# Patient Record
Sex: Male | Born: 1940 | Race: White | Hispanic: No | Smoking: Former smoker
Health system: Southern US, Community
[De-identification: ages and names within clinical notes are randomized; demographics above are authoritative.]

## PROBLEM LIST (undated history)

## (undated) DIAGNOSIS — K219 Gastro-esophageal reflux disease without esophagitis: Secondary | ICD-10-CM

## (undated) DIAGNOSIS — R42 Dizziness and giddiness: Secondary | ICD-10-CM

## (undated) DIAGNOSIS — F419 Anxiety disorder, unspecified: Secondary | ICD-10-CM

## (undated) DIAGNOSIS — I10 Essential (primary) hypertension: Secondary | ICD-10-CM

## (undated) HISTORY — DX: Anxiety disorder, unspecified: F41.9

## (undated) HISTORY — DX: Gastro-esophageal reflux disease without esophagitis: K21.9

## (undated) HISTORY — PX: SPINE SURGERY: SHX786

## (undated) HISTORY — DX: Essential (primary) hypertension: I10

---

## 1998-12-30 ENCOUNTER — Ambulatory Visit (HOSPITAL_COMMUNITY): Admission: RE | Admit: 1998-12-30 | Discharge: 1998-12-30 | Payer: Self-pay | Admitting: Family Medicine

## 1998-12-30 ENCOUNTER — Encounter: Payer: Self-pay | Admitting: Family Medicine

## 1998-12-31 ENCOUNTER — Ambulatory Visit (HOSPITAL_BASED_OUTPATIENT_CLINIC_OR_DEPARTMENT_OTHER): Admission: RE | Admit: 1998-12-31 | Discharge: 1998-12-31 | Payer: Self-pay | Admitting: Orthopedic Surgery

## 2001-01-23 ENCOUNTER — Encounter: Payer: Self-pay | Admitting: *Deleted

## 2001-01-23 ENCOUNTER — Emergency Department (HOSPITAL_COMMUNITY): Admission: EM | Admit: 2001-01-23 | Discharge: 2001-01-23 | Payer: Self-pay | Admitting: *Deleted

## 2001-07-14 ENCOUNTER — Encounter: Payer: Self-pay | Admitting: Family Medicine

## 2001-07-14 ENCOUNTER — Ambulatory Visit (HOSPITAL_COMMUNITY): Admission: RE | Admit: 2001-07-14 | Discharge: 2001-07-14 | Payer: Self-pay | Admitting: Family Medicine

## 2001-11-27 ENCOUNTER — Emergency Department (HOSPITAL_COMMUNITY): Admission: EM | Admit: 2001-11-27 | Discharge: 2001-11-27 | Payer: Self-pay

## 2002-02-12 ENCOUNTER — Emergency Department (HOSPITAL_COMMUNITY): Admission: EM | Admit: 2002-02-12 | Discharge: 2002-02-12 | Payer: Self-pay | Admitting: *Deleted

## 2002-10-13 ENCOUNTER — Ambulatory Visit (HOSPITAL_COMMUNITY): Admission: RE | Admit: 2002-10-13 | Discharge: 2002-10-13 | Payer: Self-pay | Admitting: Internal Medicine

## 2003-05-31 ENCOUNTER — Observation Stay (HOSPITAL_COMMUNITY): Admission: RE | Admit: 2003-05-31 | Discharge: 2003-06-01 | Payer: Self-pay | Admitting: Neurosurgery

## 2005-07-09 ENCOUNTER — Ambulatory Visit: Payer: Self-pay | Admitting: Internal Medicine

## 2005-07-27 ENCOUNTER — Ambulatory Visit: Payer: Self-pay | Admitting: Cardiology

## 2005-09-04 ENCOUNTER — Ambulatory Visit: Payer: Self-pay | Admitting: Internal Medicine

## 2005-09-04 ENCOUNTER — Encounter (INDEPENDENT_AMBULATORY_CARE_PROVIDER_SITE_OTHER): Payer: Self-pay | Admitting: Specialist

## 2005-09-08 ENCOUNTER — Emergency Department (HOSPITAL_COMMUNITY): Admission: EM | Admit: 2005-09-08 | Discharge: 2005-09-08 | Payer: Self-pay | Admitting: Emergency Medicine

## 2008-03-07 ENCOUNTER — Ambulatory Visit: Payer: Self-pay | Admitting: Internal Medicine

## 2010-07-09 ENCOUNTER — Encounter (INDEPENDENT_AMBULATORY_CARE_PROVIDER_SITE_OTHER): Payer: Self-pay | Admitting: *Deleted

## 2010-07-15 ENCOUNTER — Ambulatory Visit: Payer: Self-pay | Admitting: Internal Medicine

## 2010-07-29 ENCOUNTER — Ambulatory Visit: Payer: Self-pay | Admitting: Internal Medicine

## 2010-07-30 ENCOUNTER — Encounter: Payer: Self-pay | Admitting: Internal Medicine

## 2010-12-16 NOTE — Letter (Signed)
Summary: [facility] Instructions  [facility]  [address] [place], [place] [address]   Phone: [phone]  Fax: [phone]       Francisco [name]    1941-10-16    MRN: [id number]        Procedure Day Dorna Bloom:  [name]  07/29/10     Arrival Time:  10:30AM     Procedure Time:  11:30AM     Location of Procedure:                    _X _  [facility] (4th Floor)                       PREPARATION FOR COLONOSCOPY WITH MOVIPREP   Starting 5 days prior to your procedure 07/24/10 do not eat nuts, seeds, popcorn, corn, beans, peas,  salads, or any raw vegetables.  Do not take any fiber supplements (e.g. Metamucil, Citrucel, and Benefiber).  THE DAY BEFORE YOUR PROCEDURE         DATE: 07/28/10  DAY: MONDAY  1.  Drink clear liquids the entire day-NO SOLID FOOD  2.  Do not drink anything colored red or purple.  Avoid juices with pulp.  No orange juice.  3.  Drink at least 64 oz. (8 glasses) of fluid/clear liquids during the day to prevent dehydration and help the prep work efficiently.  CLEAR LIQUIDS INCLUDE: Water Jello Ice Popsicles Tea (sugar ok, no milk/cream) Powdered fruit flavored drinks Coffee (sugar ok, no milk/cream) Gatorade Juice: apple, white grape, white cranberry  Lemonade Clear bullion, consomm, broth Carbonated beverages (any kind) Strained chicken noodle soup Hard Candy                             4.  In the morning, mix first dose of MoviPrep solution:    Empty 1 Pouch A and 1 Pouch B into the disposable container    Add lukewarm drinking water to the top line of the container. Mix to dissolve    Refrigerate (mixed solution should be used within 24 hrs)  5.  Begin drinking the prep at 5:00 p.m. The MoviPrep container is divided by 4 marks.   Every 15 minutes drink the solution down to the next mark (approximately 8 oz) until the full liter is complete.   6.  Follow completed prep with 16 oz of clear liquid of your choice (Nothing  red or purple).  Continue to drink clear liquids until bedtime.  7.  Before going to bed, mix second dose of MoviPrep solution:    Empty 1 Pouch A and 1 Pouch B into the disposable container    Add lukewarm drinking water to the top line of the container. Mix to dissolve    Refrigerate  THE DAY OF YOUR PROCEDURE      DATE: 07/29/10  DAY: TUESDAY  Beginning at 6:30AM (5 hours before procedure):         1. Every 15 minutes, drink the solution down to the next mark (approx 8 oz) until the full liter is complete.  2. Follow completed prep with 16 oz. of clear liquid of your choice.    3. You may drink clear liquids until 9:30AM (2 HOURS BEFORE PROCEDURE).   MEDICATION INSTRUCTIONS  Unless otherwise instructed, you should take regular prescription medications with a small sip of water   as early as possible the morning of  your procedure.        OTHER INSTRUCTIONS  You will need a responsible adult at least 70 years of age to accompany you and drive you home.   This person must remain in the waiting room during your procedure.  Wear loose fitting clothing that is easily removed.  Leave jewelry and other valuables at home.  However, you may wish to bring a book to read or  an iPod/MP3 player to listen to music as you wait for your procedure to start.  Remove all body piercing jewelry and leave at home.  Total time from sign-in until discharge is approximately 2-3 hours.  You should go home directly after your procedure and rest.  You can resume normal activities the  day after your procedure.  The day of your procedure you should not:   Drive   Make legal decisions   Operate machinery   Drink alcohol   Return to work  You will receive specific instructions about eating, activities and medications before you leave.    The above instructions have been reviewed and explained to me by   Wyona Almas RN  July 15, 2010 10:00 AM     I fully understand and can  verbalize these instructions _____________________________ Date _________

## 2010-12-16 NOTE — Miscellaneous (Signed)
Summary: [facility] Previsit/prep  Clinical Lists Changes  Medications: Added new medication of MOVIPREP 100 GM  SOLR (PEG-KCL-NACL-NASULF-NA ASC-C) As per prep instructions. - Signed Rx of MOVIPREP 100 GM  SOLR (PEG-KCL-NACL-NASULF-NA ASC-C) As per prep instructions.;  #1 x 0;  Signed;  Entered by: Wyona Almas RN;  Authorized by: Hilarie Fredrickson MD;  Method used: Electronically to [facility] [address], [place], [place], [place]  [address], Ph: [phone], Fax: [phone] Allergies: Added new allergy or adverse reaction of PENICILLIN Observations: Added new observation of NKA: F (07/15/2010 9:07)    Prescriptions: MOVIPREP 100 GM  SOLR (PEG-KCL-NACL-NASULF-NA ASC-C) As per prep instructions.  #1 x 0   Entered by:   Wyona Almas RN   Authorized by:   Hilarie Fredrickson MD   Signed by:   Wyona Almas RN on 07/15/2010   Method used:   Electronically to        [facility] (retail)       [address]       [place], [place]  [address]       Ph: [phone]       Fax: [phone]   RxID:   [id number]

## 2010-12-16 NOTE — Procedures (Signed)
Summary: Colonoscopy  Patient: [name] Note: All result statuses are Final unless otherwise noted.  Tests: (1) Colonoscopy (COL)   COL Colonoscopy           DONE     [facility]     [address]     [place], [place]  [address]           COLONOSCOPY PROCEDURE REPORT           PATIENT:  Francisco [name], Francisco [name]  MR#:  [id number]     BIRTHDATE:  12-13-1940, 69 yrs. old  GENDER:  male     ENDOSCOPIST:  Wilhemina Bonito. Eda Keys, MD     REF. BY:  Surveillance Program Recall,     PROCEDURE DATE:  07/29/2010     PROCEDURE:  Colonoscopy with snare polypectomy x 1     ASA CLASS:  Class II     INDICATIONS:  history of pre-cancerous (adenomatous) colon polyps,     surveillance and high-risk screening ; index exam 10 -2006 w/     small adenomas     MEDICATIONS:   Fentanyl 75 mcg IV, Versed 9 mg IV, Benadryl 12.5     mg IV           DESCRIPTION OF PROCEDURE:   After the risks benefits and     alternatives of the procedure were thoroughly explained, informed     consent was obtained.  Digital rectal exam was performed and     revealed no abnormalities.   The LB CF-H180AL [id number] endoscope     was introduced through the anus and advanced to the cecum, which     was identified by both the appendix and ileocecal valve, limited     by diverticulosis, severe. Time to cecum = 2:55 min.   The quality     of the prep was excellent, using MoviPrep.  The instrument was     then slowly withdrawn (12:08 min) as the colon was fully examined.     <<PROCEDUREIMAGES>>           FINDINGS:  A 6mm pedunculated polyp was found in the mid     transverse colon and removed w/ cold snare and submitted.  Severe     diverticulosis was found throughout the colon.   Retroflexed views     in the rectum revealed no abnormalities.    The scope was then     withdrawn from the patient and the procedure completed.           COMPLICATIONS:  None           ENDOSCOPIC IMPRESSION:     1) Pedunculated polyp in the mid  transverse colon - removed     2) Severe diverticulosis throughout the colon           RECOMMENDATIONS:     1) Follow up colonoscopy in 5 years           ______________________________     Wilhemina Bonito. Eda Keys, MD           CC:  Helene Kelp, PA (Dr Melene Muller Office); the Patient           n.     eSIGNED:   Wilhemina Bonito. Eda Keys at 07/29/2010 12:36 PM           Stana Bunting, [id number]  Note: An exclamation mark (!) indicates a result that was not dispersed into the flowsheet. Document Creation Date: 07/29/2010  12:36 PM _______________________________________________________________________  (1) Order result status: Final Collection or observation date-time: 07/29/2010 12:30 Requested date-time:  Receipt date-time:  Reported date-time:  Referring Physician:   Ordering Physician: Fransico Setters [phone]) Specimen Source:  Source: Launa Grill Order Number: [id number] Lab site:   Appended Document: Colonoscopy     Procedures Next Due Date:    Colonoscopy: 07/2015

## 2010-12-16 NOTE — Letter (Signed)
Summary: Patient Notice- Polyp Results  [facility]  [address] [place], [place] [address]   Phone: [phone]  Fax: [phone]        July 30, 2010 MRN: [id number]    Francisco [name] [address] [place], [place]  [address]    Dear [name],  I am pleased to inform you that the colon polyp(s) removed during your recent colonoscopy was (were) found to be benign (no cancer detected) upon pathologic examination.  I recommend you have a repeat colonoscopy examination in 5 years to look for recurrent polyps, as having colon polyps increases your risk for having recurrent polyps or even colon cancer in the future.  Should you develop new or worsening symptoms of abdominal pain, bowel habit changes or bleeding from the rectum or bowels, please schedule an evaluation with either your primary care physician or with me.  Additional information/recommendations:  __ No further action with gastroenterology is needed at this time. Please      follow-up with your primary care physician for your other healthcare      needs.    Please call us if you are having persistent problems or have questions about your condition that have not been fully answered at this time.  Sincerely,  Hilarie Fredrickson MD  This letter has been electronically signed by your physician.  Appended Document: Patient Notice- Polyp Results letter mailed

## 2011-03-31 NOTE — Assessment & Plan Note (Signed)
[facility]                         GASTROENTEROLOGY OFFICE NOTE   WALLIS, VANCOTT                         MRN:          [id number]  DATE:03/07/2008                            DOB:          1941-05-20    REASON FOR CONSULTATION:  1. Reflux disease.  2. Dysphagia.  3. Colon polyp surveillance.   HISTORY:  This is a pleasant 70 year old white male with a history of  gastroesophageal reflux disease, hypertension, hyperlipidemia,  osteoarthritis, and adenomatous colon polyps.  He is sent today by Dr.  Christell Constant regarding the above-listed issues.  The patient was evaluated  previously in August of 2006 for reflux symptoms, dysphagia, and colon  cancer screening.  He subsequently underwent colonoscopy and upper  endoscopy, September 04, 2005.  Colonoscopy revealed pan diverticulosis  which was quite marked.  In addition, multiple colon polyps (six) which  were removed and found to be adenomatous.  Followup in 3 years  recommended.  Upper endoscopy that same day revealed a distal esophageal  stricture as well as a hiatal hernia and changes of reflux.  A 70-French  Maloney dilator was used to dilate the stricture.  He was placed on  daily omeprazole.  The patient tells me that his dysphagia seemed to be  improved for about 6 months.  He came off of his Prilosec.  He now tells  me that he has dysphagia not only to solids, but, at times, liquids  only.  He does have some intermittent heartburn and indigestion.  He  states the symptoms are not severe.  He denies abdominal pain, change in  bowel habits, melena or hematochezia.  He has had no weight loss.   PAST MEDICAL HISTORY:  1. Hypertension.  2. Hyperlipidemia.  3. Osteoarthritis.  4. Gastroesophageal reflux disease with peptic stricture.  5. Adenomatous colon polyps.  6. Incidental diverticulosis.   PAST SURGICAL HISTORY:  1. Posterior cervical disk surgery, 1982.  2. Anterior cervical disk surgery,  2004.  3. Transurethral resection of the prostate 8 years ago.  4. Carpal tunnel release.   ALLERGIES:  PENICILLIN.   CURRENT MEDICATIONS:  1. Lisinopril 10 mg daily.  2. Vytorin 1 daily.  3. Aspirin 325 mg daily.  4. B12 supplement.   FAMILY HISTORY:  No family history of gastrointestinal malignancy.   SOCIAL HISTORY:  The patient is now retired.  He is married and spends  the majority of his time taking care of his wife who is disabled  secondary to stroke.  They have children.  He does not smoke anymore,  and rarely uses alcohol.   REVIEW OF SYSTEMS:  Non GI review of systems is only remarkable for  occasional joint aches, otherwise, negative.   PHYSICAL EXAMINATION:  A well-appearing male in no acute distress.  He  is alert and oriented.  Blood pressure is 126/72, heart rate 74 and regular, respirations are 18  and regular.  His weight is 197 pounds.  HEENT:  Sclerae anicteric.  Conjunctivae are pink.  Oral mucosa is  intact.  There is a venous bleb on the lower lip.  Unchanged  from  previous.  Thyroid is normal.  No adenopathy.  LUNGS:  Clear to auscultation and percussion.  HEART:  Regular without murmur.  ABDOMEN:  Obese and soft without tenderness, mass, or hernia.  Good  bowel sounds heard.  RECTAL EXAM:  Deferred.  EXTREMITIES:  Without clubbing, cyanosis, or edema.  NEUROLOGICALLY:  He is grossly intact.  SKIN EXAM:  No evidence of jaundice or rash.   IMPRESSION:  1. Gastroesophageal reflux disease.  2. Intermittent dysphagia to liquids and solids.  Certainly solid-food      dysphagia could be due to known stricture.  However, liquid      dysphagia is likely due to dysmotility (possibly from reflux).      Currently off medical therapy.  3. History of multiple adenomatous colon polyps.  Due for surveillance      in October of this year.  Currently asymptomatic.  4. Incidental diverticulosis.  5. General medical problems under the care of Dr. Christell Constant.    RECOMMENDATIONS:  1. Resume Prilosec 20 mg daily.  This may alleviate his dysphagia.  2. Anticipate surveillance colonoscopy this fall.  3. Resume general medical care with Dr. Christell Constant.  4. If dysphagia persists or worsens despite reintroduction of medical      therapy, then consider repeat esophageal dilation.     Wilhemina Bonito. Marina Goodell, MD  Electronically Signed    JNP/MedQ  DD: 03/07/2008  DT: 03/07/2008  Job #: [id number]   cc:   Ernestina Penna, M.D.

## 2011-04-03 NOTE — Consult Note (Signed)
Francisco [name], Francisco [name]                ACCOUNT NO.:  [id number]   MEDICAL RECORD NO.:  [id number]          PATIENT TYPE:  EMS   LOCATION:  MAJO                         FACILITY:  [facility]   PHYSICIAN:  Stefani Dama, M.D.  DATE OF BIRTH:  [birth year]   DATE OF CONSULTATION:  09/08/2005  DATE OF DISCHARGE:                                   CONSULTATION   REQUESTOR:  Lorre Nick, M.D.   REASON FOR REQUEST:  Subarachnoid hemorrhage on CT scan.   HISTORY OF PRESENT ILLNESS:  Francisco [name] is a 70 year old individual who is a  patient of Dr. Delma Officer in our practice, having had a 2-level anterior  cervical decompression at C5-6 and C6-7 a few years back.  Today, while at  work, he apparently had an episode while working under some machinery.  He  notes that he went to straighten his head up and hit the back of his head on  some equipment.  He did not have a loss of consciousness; in fact, he notes  that the hit was mild, at best.  It did not really phase him.  But  subsequently, he developed some numbness and tingling into the right upper  extremity.  He noticed that this was particularly aggravated when he  strained with a wrench.  This situation seemed to get better with a slight  passage of time, but then later in the day he had another episode where,  again, while straining on a wrench he experienced some numbness into the  opposite upper extremity.  This a rather bothersome to the patient.  He  notes that he has had some persistence of the numbness in the right hand,  particularly in the ulnar distribution, and he was seen by the local  physician and referred to the emergency room.  A CT of the head was  performed which demonstrates the presence of some subcortical subarachnoid  hemorrhage over the left parietal region.  There is no shift.  There is no  mass effect.  There is no other significant abnormality in this region.  There is no other sign of trauma on the CT scan.   PAST  MEDICAL HISTORY:  The patient is generally healthy.  He takes as  aspirin a day, though he had stopped for about a week because he had a  colonoscopy and endoscopy about a week ago.  He denies any significant  headaches.  Denies any other focal weakness.   PHYSICAL EXAMINATION:  HEENT:  His pupils are 4 mm, briskly reactive to  light and accommodation.  Extraocular movements are full.  The face is  symmetric to grimace.  Tongue and uvula are in the midline.  Sclerae and  conjunctivae are clear.  There is no evidence of any focal weakness, and  there is no evidence of a drift in the upper extremities.  Pupils are 3 mm,  briskly reactive to light and accommodation.  NEUROLOGIC:  Lower extremity strength is normal.  DTRs are 2+ in the biceps,  1+ in the triceps, 1+ in the patella and the Achilles.  Babinski's are  downgoing.  Sensation is intact.   IMPRESSION:  The patient had a CT scan with a small cortical subarachnoid  hemorrhage in the left frontoparietal region.  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   IMPRESSION:  I believe that the patient's transient numbness is likely  related to the cervical spondylosis in the awkward positions that he had to  be in.  I do not believe it is related to the small amount of subcortical  subarachnoid hemorrhage.  I advised the patient at this time that he should  continue on his aspirin.  We should simply observe him  at home.  I advised him that he should be out of work for the next 2 days'  time.  If symptoms recur, continue, or worsen in any way, I would advise  follow up with Dr. Lovell Sheehan on an outpatient basis, or the patient may return  to the hospital ER.  Will write him for out of work for 48 hours.      Stefani Dama, M.D.  Electronically Signed     HJE/MEDQ  D:  09/08/2005  T:  09/09/2005  Job:  [id number]

## 2011-04-03 NOTE — Op Note (Signed)
NAME:  Francisco [name], Francisco [name]                          ACCOUNT NO.:  [id number]   MEDICAL RECORD NO.:  [id number]                   PATIENT TYPE:  INP   LOCATION:  3011                                 FACILITY:  [facility]   PHYSICIAN:  Cristi Loron, M.D.            DATE OF BIRTH:  08/22/1941   DATE OF PROCEDURE:  05/31/2003  DATE OF DISCHARGE:                                 OPERATIVE REPORT   BRIEF HISTORY:  The patient is a 70 year old white male who has suffered  from a long history of neck  and arm pain. He failed medical management  and  was worked up with a cervical MRI and cervical CT scan which demonstrated  significant stenosis and spondylosis at C5-6 and C6-7. I discussed the  various treatment options with him including  surgery, and the  risks,  benefits and alternatives to surgery and he decided to proceed with a 2-  level anterior cervical diskectomy with fusion and plating.   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   POSTOPERATIVE DIAGNOSIS:  C5-6 and C6-7 degenerative disk disease,  spondylosis, stenosis and cervical radiculopathy and cervicalgia.   PROCEDURE:  C5-6 and C6-7 extensive anterior cervical  diskectomy/decompression; interbody iliac crest allograft arthrodesis;  anterior cervical plating with Synthes titanium plate and screws.   SURGEON:  Cristi Loron, M.D.   ASSISTANT:  Payton Doughty, M.D.   ANESTHESIA:  General endotracheal anesthesia.   ESTIMATED BLOOD LOSS:  100 mL.   SPECIMENS:  None.   DRAINS:  None.   COMPLICATIONS:  None.   DESCRIPTION OF PROCEDURE:  The patient was brought to the operating room by  the anesthesia team  and general endotracheal anesthesia was induced. The  patient remained in the supine position. A roll was placed under his  shoulders which placed his neck in a slight extension. The anterior cervical  region was then prepared  with Betadine scrub and Betadine solution. Sterile  drapes were applied. I then injected the area to be incised with Marcaine  with epinephrine solution.   I used the scalpel to make a transverse incision in the patient's left  anterior neck. I used the Metzenbaum scissors to divide the patient's  platysma muscle and then to dissect the medial sternocleidomastoid muscle,  jugular vein and carotid artery. We bluntly dissected down to the anterior  cervical spine with the Kidner swabs. I carefully identified the esophagus  and retracted it medially. We then cleared the soft tissue from the anterior  cervical spine using the Kidner swabs. We inserted a bent spinal needle at  the upper exposed interspace and obtained an intraoperative radiograph to  confirm our location.   We then  used electrocautery to detach the medial border of the longus coli  muscle bilaterally from C5-6 and C6-7 intravertebral disk space. We inserted  the Caspar self-retaining retractor  for exposure and I then incised the C5-6  and the vertebral disk performing a partial diskectomy using pituitary  forceps and the Carlens curettes. We inserted distracting screws at C5 and  C6 to distract the interspace and then used the high-speed drill to  decorticate the vertebral disk placed at C5-6, to drill away the remainder  of the intervertebral disk and to thin out the posterior longitudinal  ligament.   We then incised the thinned out ligament with a retro knife and then removed  it with a Kerrison punch, undercutting the vertebral endplates at C5-6,  removing the spondylosis and decompressing the  thecal sac. We then  performed bilateral foraminotomies about  the bilateral C6 nerve root,  completing the decompression at this level. We then completed the anterior  cervical diskectomy plus decompression at C6-7 an analogous fashion,  decompressing the  thecal sac and the bilateral C7 nerve roots.   We then turned our  attention to the anterior arthrodesis. We obtained iliac  crest tricortical allograft bone graft and fashioned it to these approximate  dimensions, 7 mm in height and 1 cm in depth. We inserted 1 distracted bone  graft into the C6-7 intervertebral disk space and removed the distraction  screw from the C7, placed it back in C5, distracted the C5-6 and placed the  2nd bone graft in the C5-6 interspace and then removed the distractor  screws. There was good bone graft above both level.   We now turned our attention to the anterior spinal fixation. We obtained the  appropriate length Synthes titanium plate. We drilled some spondylosis from  the vertebral endplates at C5-6 and C6-7. We laid the plate along the  anterior aspect of the vertebral bodies at C5 and C7.  Drilled some drill  holes at C5 __________ C6 to C7 tap holes and then secured the plate to the  vertebral bodies by placing two 14-mm screws at the C5, 6 and 7. We obtained  an intraoperative radiograph that demonstrated good position of the upper  plate and screws, but we could not see the lower part of the instrumentation  secondary to the patient's body habitus. It looked good in vivo. We then  secured the screws to the plate by using the locking screw on each screw.   We copiously irrigated the wound out with Bacitracin solution. We removed  the solution. We obtained excellent hemostasis with intraoperative  electrocautery. We removed the Caspar self-retaining retractor and then  inspected the esophagus for any damage. There was none apparent. We then  reapproximated the patient's platysma muscle with interrupted 3-0 Vicryl  suture, the subcutaneous tissue with interrupted 0 Vicryl suture  and the  skin with Steri-Strips and Benzoin. The wound was then coated with  Bacitracin ointment. A sterile dressing was applied. The drapes were  removed.  The patient was subsequently extubated by the anesthesia team and  transported to  the post anesthesia care unit in stable condition. All  sponge, instrument and needle counts were correct at the end of the case.                                               Cristi Loron, M.D.    JDJ/MEDQ  D:  05/31/2003  T:  06/01/2003  Job:  [id number]

## 2013-02-22 ENCOUNTER — Encounter: Payer: Self-pay | Admitting: Nurse Practitioner

## 2013-02-22 ENCOUNTER — Ambulatory Visit (INDEPENDENT_AMBULATORY_CARE_PROVIDER_SITE_OTHER): Payer: Medicare Other | Admitting: Nurse Practitioner

## 2013-02-22 ENCOUNTER — Ambulatory Visit (INDEPENDENT_AMBULATORY_CARE_PROVIDER_SITE_OTHER): Payer: Medicare Other

## 2013-02-22 VITALS — BP 142/81 | HR 77 | Temp 98.1°F | Ht 69.0 in | Wt 202.0 lb

## 2013-02-22 DIAGNOSIS — M25512 Pain in left shoulder: Secondary | ICD-10-CM

## 2013-02-22 DIAGNOSIS — R109 Unspecified abdominal pain: Secondary | ICD-10-CM

## 2013-02-22 DIAGNOSIS — K5900 Constipation, unspecified: Secondary | ICD-10-CM

## 2013-02-22 DIAGNOSIS — M25519 Pain in unspecified shoulder: Secondary | ICD-10-CM

## 2013-02-22 LAB — POCT CBC
Granulocyte percent: 70.7 %G (ref 37–80)
HCT, POC: 40.5 % — AB (ref 43.5–53.7)
Hemoglobin: 14 g/dL — AB (ref 14.1–18.1)
Lymph, poc: 2 (ref 0.6–3.4)
MCHC: 34.7 g/dL (ref 31.8–35.4)
MCV: 92.3 fL (ref 80–97)
MPV: 8 fL (ref 0–99.8)
POC Granulocyte: 4.9 (ref 2–6.9)
POC LYMPH PERCENT: 28.4 %L (ref 10–50)
Platelet Count, POC: 144 10*3/uL (ref 142–424)
RBC: 4.4 M/uL — AB (ref 4.69–6.13)
WBC: 6.9 10*3/uL (ref 4.6–10.2)

## 2013-02-22 NOTE — Progress Notes (Signed)
  Subjective:    Patient ID: Francisco [name], male    DOB: 1941/09/20, 72 y.o.   MRN: [id number]  HPI-Patient in complaining of pain Abdominal pain. Started 3weekago. constant. Rates pain 1-8/10. Nothing  Helps pain. Actvity increases pain. Hurts worse sometimes after he has eaten. * Shoulder arthritis for years- Started hurting again a couple of weeks ago. Hurts worse when he is reaching for something.     Review of Systems  Constitutional: Negative for fever and chills.  Gastrointestinal: Positive for abdominal pain and abdominal distention. Negative for nausea, vomiting, diarrhea and constipation.       Belching frequently   Musculoskeletal: Positive for arthralgias (left shoulder).       Objective:   Physical Exam  Constitutional: He appears well-developed and well-nourished.  Cardiovascular: Normal rate, normal heart sounds and intact distal pulses.   Pulmonary/Chest: Effort normal and breath sounds normal.  Abdominal: Soft. Bowel sounds are normal. He exhibits no mass. There is tenderness (mild RUQ and RLQ).  Musculoskeletal: Normal range of motion.  Pain with  Internal rotation. Abduction with resistant causes no pain  Skin: Skin is warm and dry.  BP 142/81  Pulse 77  Temp(Src) 98.1 F (36.7 C) (Oral)  Ht 5\' 9"  (1.753 m)  Wt 202 lb (91.627 kg)  BMI 29.82 kg/m2 Results for orders placed in visit on 02/22/13  POCT CBC      Result Value Range   WBC 6.9  4.6 - 10.2 K/uL   Lymph, poc 2.0  0.6 - 3.4   POC LYMPH PERCENT 28.4  10 - 50 %L   MID (cbc)    0 - 0.9   POC MID %    0 - 12 %M   POC Granulocyte 4.9  2 - 6.9   Granulocyte percent 70.7  37 - 80 %G   RBC 4.4 (*) 4.69 - 6.13 M/uL   Hemoglobin 14.0 (*) 14.1 - 18.1 g/dL   HCT, POC 14.7 (*) 82.9 - 53.7 %   MCV 92.3  80 - 97 fL   MCH, POC 32.0 (*) 27 - 31.2 pg   MCHC 34.7  31.8 - 35.4 g/dL   RDW, POC 56.2     Platelet Count, POC 144.0  142 - 424 K/uL   MPV 8.0  0 - 99.8 fL    KUB-Moderate amount of stool in colon   Preliminary reading by Paulene Floor, FNP  [facility]        Assessment & Plan:  Abd pain/Constipation  MOM and prune juice  Force fluids  Increase fiber in diet Shoulder pain  Extra strength tylenol  Heat or ice if helps  RTO prn  Mary-Margaret Daphine Deutscher, FNP

## 2013-02-22 NOTE — Patient Instructions (Signed)

## 2013-02-23 LAB — COMPLETE METABOLIC PANEL WITH GFR
ALT: 14 U/L (ref 0–53)
AST: 18 U/L (ref 0–37)
Albumin: 4.2 g/dL (ref 3.5–5.2)
Alkaline Phosphatase: 36 U/L — ABNORMAL LOW (ref 39–117)
BUN: 19 mg/dL (ref 6–23)
CO2: 28 mEq/L (ref 19–32)
Calcium: 8.8 mg/dL (ref 8.4–10.5)
Chloride: 107 mEq/L (ref 96–112)
Creat: 1.11 mg/dL (ref 0.50–1.35)
GFR, Est African American: 77 mL/min
GFR, Est Non African American: 66 mL/min
Glucose, Bld: 111 mg/dL — ABNORMAL HIGH (ref 70–99)
Potassium: 4.3 mEq/L (ref 3.5–5.3)
Sodium: 142 mEq/L (ref 135–145)
Total Bilirubin: 0.7 mg/dL (ref 0.3–1.2)
Total Protein: 5.7 g/dL — ABNORMAL LOW (ref 6.0–8.3)

## 2013-02-23 LAB — AMYLASE: Amylase: 45 U/L (ref 0–105)

## 2013-04-12 ENCOUNTER — Encounter: Payer: Self-pay | Admitting: Nurse Practitioner

## 2013-04-12 ENCOUNTER — Ambulatory Visit (INDEPENDENT_AMBULATORY_CARE_PROVIDER_SITE_OTHER): Payer: Medicare Other | Admitting: Nurse Practitioner

## 2013-04-12 VITALS — BP 119/77 | HR 60 | Temp 97.4°F | Ht 69.0 in | Wt 196.0 lb

## 2013-04-12 DIAGNOSIS — K219 Gastro-esophageal reflux disease without esophagitis: Secondary | ICD-10-CM | POA: Insufficient documentation

## 2013-04-12 DIAGNOSIS — F411 Generalized anxiety disorder: Secondary | ICD-10-CM | POA: Insufficient documentation

## 2013-04-12 DIAGNOSIS — I10 Essential (primary) hypertension: Secondary | ICD-10-CM | POA: Insufficient documentation

## 2013-04-12 DIAGNOSIS — E785 Hyperlipidemia, unspecified: Secondary | ICD-10-CM

## 2013-04-12 DIAGNOSIS — E782 Mixed hyperlipidemia: Secondary | ICD-10-CM | POA: Insufficient documentation

## 2013-04-12 LAB — COMPLETE METABOLIC PANEL WITH GFR
ALT: 16 U/L (ref 0–53)
AST: 18 U/L (ref 0–37)
Albumin: 4.2 g/dL (ref 3.5–5.2)
Alkaline Phosphatase: 38 U/L — ABNORMAL LOW (ref 39–117)
BUN: 20 mg/dL (ref 6–23)
CO2: 30 mEq/L (ref 19–32)
Calcium: 8.7 mg/dL (ref 8.4–10.5)
Chloride: 104 mEq/L (ref 96–112)
Creat: 0.86 mg/dL (ref 0.50–1.35)
GFR, Est African American: >89 mL/min
GFR, Est Non African American: 87 mL/min
Glucose, Bld: 105 mg/dL — ABNORMAL HIGH (ref 70–99)
Sodium: 142 mEq/L (ref 135–145)
Total Bilirubin: 0.8 mg/dL (ref 0.3–1.2)
Total Protein: 6 g/dL (ref 6.0–8.3)

## 2013-04-12 NOTE — Progress Notes (Signed)
Subjective:     Patient ID: Francisco [name], male   DOB: [birth year], 72 y.o.   MRN: [id number]  Hypertension This is a chronic problem. The current episode started more than 1 year ago. The problem is unchanged. The problem is controlled. Pertinent negatives include no blurred vision, chest pain, headaches, neck pain, peripheral edema or shortness of breath. There are no associated agents to hypertension. Risk factors for coronary artery disease include dyslipidemia, male gender and obesity. Past treatments include ACE inhibitors. The current treatment provides significant improvement. There are no compliance problems.   Asthma There is no shortness of breath. This is a chronic problem. The current episode started more than 1 year ago. The problem occurs rarely. The problem has been unchanged. Pertinent negatives include no chest pain, headaches or sore throat. He reports significant improvement on treatment. His past medical history is significant for asthma.  Gastrophageal Reflux He reports no chest pain, no nausea or no sore throat. This is a chronic problem. The problem occurs rarely.  Hyperlipidemia This is a chronic problem. The current episode started more than 1 year ago. The problem is uncontrolled. Recent lipid tests were reviewed and are normal. He has no history of diabetes, hypothyroidism or obesity. Pertinent negatives include no chest pain or shortness of breath. Current antihyperlipidemic treatment includes statins and ezetimibe. The current treatment provides significant improvement of lipids. Compliance problems include adherence to diet.  Risk factors for coronary artery disease include hypertension and male sex.     Review of Systems  HENT: Negative for sore throat and neck pain.   Eyes: Negative for blurred vision.  Respiratory: Negative for shortness of breath.   Cardiovascular: Negative for chest pain.  Gastrointestinal: Negative for nausea.  Neurological: Negative for  headaches.  All other systems reviewed and are negative.       Objective:   Physical Exam  Constitutional: He is oriented to person, place, and time. He appears well-developed and well-nourished.  HENT:  Head: Normocephalic.  Right Ear: External ear normal.  Left Ear: External ear normal.  Nose: Nose normal.  Mouth/Throat: Oropharynx is clear and moist.  Eyes: EOM are normal. Pupils are equal, round, and reactive to light.  Neck: Normal range of motion. Neck supple. No thyromegaly present.  Cardiovascular: Normal rate, regular rhythm, normal heart sounds and intact distal pulses.   No murmur heard. Pulmonary/Chest: Effort normal and breath sounds normal. He has no wheezes. He has no rales.  Abdominal: Soft. Bowel sounds are normal.  Musculoskeletal: Normal range of motion.  Neurological: He is alert and oriented to person, place, and time.  Skin: Skin is warm and dry.  Psychiatric: He has a normal mood and affect. His behavior is normal. Judgment and thought content normal.   BP 119/77  Pulse 60  Temp(Src) 97.4 F (36.3 C) (Oral)  Ht 5\' 9"  (1.753 m)  Wt 196 lb (88.905 kg)  BMI 28.93 kg/m2      Assessment:     1. HTN (hypertension)   2. Hypertension   3. GAD (generalized anxiety disorder)   4. GERD (gastroesophageal reflux disease)   5. Hyperlipidemia         Plan:     Orders Placed This Encounter  Procedures  . COMPLETE METABOLIC PANEL WITH GFR  . NMR Lipoprofile with Lipids     Medication List       These changes are accurate as of: 04/12/2013 11:43 AM. If you have any questions, ask your nurse or doctor.  TAKE these medications       ALPRAZolam 1 MG tablet  Commonly known as:  XANAX  Take 1 mg by mouth 3 (three) times daily as needed for sleep.     aspirin 81 MG tablet  Take 81 mg by mouth daily.     budesonide-formoterol 160-4.5 MCG/ACT inhaler  Commonly known as:  SYMBICORT  Inhale 2 puffs into the lungs 2 (two) times daily.      ezetimibe-simvastatin 10-40 MG per tablet  Commonly known as:  VYTORIN  Take 1 tablet by mouth at bedtime.     fish oil-omega-3 fatty acids 1000 MG capsule  Take 2 g by mouth daily.     lisinopril 10 MG tablet  Commonly known as:  PRINIVIL,ZESTRIL  Take 10 mg by mouth daily.       Continue all meds  Labs pending Diet and exercise encouraged F/U in 3 months  Mary-Margaret Daphine Deutscher, FNP

## 2013-04-12 NOTE — Patient Instructions (Signed)
Health Maintenance, Males A healthy lifestyle and preventative care can promote health and wellness.  Maintain regular health, dental, and eye exams.  Eat a healthy diet. Foods like vegetables, fruits, whole grains, low-fat dairy products, and lean protein foods contain the nutrients you need without too many calories. Decrease your intake of foods high in solid fats, added sugars, and salt. Get information about a proper diet from your caregiver, if necessary.  Regular physical exercise is one of the most important things you can do for your health. Most adults should get at least 150 minutes of moderate-intensity exercise (any activity that increases your heart rate and causes you to sweat) each week. In addition, most adults need muscle-strengthening exercises on 2 or more days a week.   Maintain a healthy weight. The body mass index (BMI) is a screening tool to identify possible weight problems. It provides an estimate of body fat based on height and weight. Your caregiver can help determine your BMI, and can help you achieve or maintain a healthy weight. For adults 20 years and older:  A BMI below 18.5 is considered underweight.  A BMI of 18.5 to 24.9 is normal.  A BMI of 25 to 29.9 is considered overweight.  A BMI of 30 and above is considered obese.  Maintain normal blood lipids and cholesterol by exercising and minimizing your intake of saturated fat. Eat a balanced diet with plenty of fruits and vegetables. Blood tests for lipids and cholesterol should begin at age 20 and be repeated every 5 years. If your lipid or cholesterol levels are high, you are over 50, or you are a high risk for heart disease, you may need your cholesterol levels checked more frequently.Ongoing high lipid and cholesterol levels should be treated with medicines, if diet and exercise are not effective.  If you smoke, find out from your caregiver how to quit. If you do not use tobacco, do not start.  If you  choose to drink alcohol, do not exceed 2 drinks per day. One drink is considered to be 12 ounces (355 mL) of beer, 5 ounces (148 mL) of wine, or 1.5 ounces (44 mL) of liquor.  Avoid use of street drugs. Do not share needles with anyone. Ask for help if you need support or instructions about stopping the use of drugs.  High blood pressure causes heart disease and increases the risk of stroke. Blood pressure should be checked at least every 1 to 2 years. Ongoing high blood pressure should be treated with medicines if weight loss and exercise are not effective.  If you are 45 to 72 years old, ask your caregiver if you should take aspirin to prevent heart disease.  Diabetes screening involves taking a blood sample to check your fasting blood sugar level. This should be done once every 3 years, after age 45, if you are within normal weight and without risk factors for diabetes. Testing should be considered at a younger age or be carried out more frequently if you are overweight and have at least 1 risk factor for diabetes.  Colorectal cancer can be detected and often prevented. Most routine colorectal cancer screening begins at the age of 50 and continues through age 75. However, your caregiver may recommend screening at an earlier age if you have risk factors for colon cancer. On a yearly basis, your caregiver may provide home test kits to check for hidden blood in the stool. Use of a small camera at the end of a tube,   to directly examine the colon (sigmoidoscopy or colonoscopy), can detect the earliest forms of colorectal cancer. Talk to your caregiver about this at age 50, when routine screening begins. Direct examination of the colon should be repeated every 5 to 10 years through age 75, unless early forms of pre-cancerous polyps or small growths are found.  Hepatitis C blood testing is recommended for all people born from 1945 through 1965 and any individual with known risks for hepatitis C.  Healthy  men should no longer receive prostate-specific antigen (PSA) blood tests as part of routine cancer screening. Consult with your caregiver about prostate cancer screening.  Testicular cancer screening is not recommended for adolescents or adult males who have no symptoms. Screening includes self-exam, caregiver exam, and other screening tests. Consult with your caregiver about any symptoms you have or any concerns you have about testicular cancer.  Practice safe sex. Use condoms and avoid high-risk sexual practices to reduce the spread of sexually transmitted infections (STIs).  Use sunscreen with a sun protection factor (SPF) of 30 or greater. Apply sunscreen liberally and repeatedly throughout the day. You should seek shade when your shadow is shorter than you. Protect yourself by wearing long sleeves, pants, a wide-brimmed hat, and sunglasses year round, whenever you are outdoors.  Notify your caregiver of new moles or changes in moles, especially if there is a change in shape or color. Also notify your caregiver if a mole is larger than the size of a pencil eraser.  A one-time screening for abdominal aortic aneurysm (AAA) and surgical repair of large AAAs by sound wave imaging (ultrasonography) is recommended for ages 65 to 75 years who are current or former smokers.  Stay current with your immunizations. Document Released: 04/30/2008 Document Revised: 01/25/2012 Document Reviewed: 03/30/2011 [employer] Patient Information 2014 [employer].  

## 2013-04-14 LAB — NMR LIPOPROFILE WITH LIPIDS
Cholesterol, Total: 113 mg/dL (ref <200)
HDL Particle Number: 37.4 umol/L (ref >=30.5)
HDL Size: 9.5 nm (ref >=9.2)
HDL-C: 51 mg/dL (ref >=40)
LDL Particle Number: 565 nmol/L (ref <1000)
LDL Size: 19.6 nm — ABNORMAL LOW (ref >20.5)
Large HDL-P: 5.1 umol/L (ref >=4.8)
Large VLDL-P: 3 nmol/L — ABNORMAL HIGH (ref <=2.7)
Small LDL Particle Number: 486 nmol/L (ref <=527)
VLDL Size: 46.6 nm (ref <=46.6)

## 2013-04-17 ENCOUNTER — Telehealth: Payer: Self-pay | Admitting: Nurse Practitioner

## 2013-04-17 MED ORDER — ALPRAZOLAM 1 MG PO TABS: 1.0000 mg | ORAL_TABLET | Freq: Three times a day (TID) | ORAL | Status: DC | PRN

## 2013-04-17 NOTE — Telephone Encounter (Signed)
Callin RX for xanax

## 2013-04-17 NOTE — Telephone Encounter (Signed)
Call into [facility], he doesn't have any for tomorrow

## 2013-04-17 NOTE — Telephone Encounter (Signed)
Called in.

## 2013-04-18 NOTE — Telephone Encounter (Signed)
Done by Bertram Gala.

## 2013-04-25 NOTE — Telephone Encounter (Signed)
Error

## 2013-05-08 ENCOUNTER — Telehealth: Payer: Self-pay | Admitting: Nurse Practitioner

## 2013-05-08 NOTE — Telephone Encounter (Signed)
Samples done 

## 2013-05-09 ENCOUNTER — Ambulatory Visit (INDEPENDENT_AMBULATORY_CARE_PROVIDER_SITE_OTHER): Payer: Medicare Other | Admitting: Nurse Practitioner

## 2013-05-09 VITALS — BP 144/73 | HR 79 | Temp 97.2°F | Ht 69.0 in | Wt 194.0 lb

## 2013-05-09 DIAGNOSIS — F329 Major depressive disorder, single episode, unspecified: Secondary | ICD-10-CM

## 2013-05-09 MED ORDER — ALPRAZOLAM 1 MG PO TABS: 1.0000 mg | ORAL_TABLET | Freq: Three times a day (TID) | ORAL | Status: DC | PRN

## 2013-05-09 MED ORDER — ESCITALOPRAM OXALATE 10 MG PO TABS: 10.0000 mg | ORAL_TABLET | Freq: Every day | ORAL | Status: DC

## 2013-05-09 NOTE — Addendum Note (Signed)
Addended by: Bennie Pierini on: 05/09/2013 10:05 AM   Modules accepted: Orders, Medications

## 2013-05-09 NOTE — Progress Notes (Signed)
  Subjective:    Patient ID: Escher Harr, male    DOB: [birth year], 72 y.o.   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 Said blood pressure has been fluctuating which he attributes to his stress.    Review of Systems  Constitutional: Positive for fatigue. Negative for activity change and appetite change.  HENT: Negative.   Respiratory: Negative.   Cardiovascular: Negative.   Gastrointestinal: Negative.   Psychiatric/Behavioral: Positive for suicidal ideas and sleep disturbance. The patient is nervous/anxious.        Objective:   Physical Exam  Constitutional: He is oriented to person, place, and time. He appears well-developed and well-nourished.  Cardiovascular: Normal rate and normal heart sounds.   Pulmonary/Chest: Effort normal and breath sounds normal.  Neurological: He is alert and oriented to person, place, and time.  Psychiatric: His speech is normal and behavior is normal. Judgment and thought content normal. His mood appears anxious. Cognition and memory are normal. He exhibits a depressed mood.     BP 144/73  Pulse 79  Temp(Src) 97.2 F (36.2 C) (Oral)  Ht 5\' 9"  (1.753 m)  Wt 194 lb (87.998 kg)  BMI 28.64 kg/m2      Assessment & Plan:  1. Depression Stress management Do something for yourself everyday Walk daily - escitalopram (LEXAPRO) 10 MG tablet; Take 1 tablet (10 mg total) by mouth daily.  Dispense: 30 tablet; Refill: 3  Mary-Margaret Daphine Deutscher, FNP

## 2013-05-09 NOTE — Patient Instructions (Signed)

## 2013-05-26 ENCOUNTER — Ambulatory Visit (INDEPENDENT_AMBULATORY_CARE_PROVIDER_SITE_OTHER): Payer: Medicare Other | Admitting: Family Medicine

## 2013-05-26 VITALS — BP 168/79 | HR 76 | Wt 194.0 lb

## 2013-05-26 DIAGNOSIS — F32A Depression, unspecified: Secondary | ICD-10-CM

## 2013-05-26 DIAGNOSIS — R079 Chest pain, unspecified: Secondary | ICD-10-CM

## 2013-05-26 DIAGNOSIS — F329 Major depressive disorder, single episode, unspecified: Secondary | ICD-10-CM

## 2013-05-26 MED ORDER — ESCITALOPRAM OXALATE 10 MG PO TABS: 20.0000 mg | ORAL_TABLET | Freq: Every day | ORAL | Status: DC

## 2013-05-26 MED ORDER — ALPRAZOLAM 1 MG PO TABS: ORAL_TABLET | ORAL | Status: DC

## 2013-05-26 NOTE — Progress Notes (Signed)
  Subjective:    Patient ID: Francisco [name], male    DOB: 1940-12-17, 72 y.o.   MRN: [id number]  HPI This 72 y.o. male presents for evaluation of anxiety and feeling nervous. He is taking care of his wife who has hx of CVA and does total care. He was feeling anxious and became diaphoretic and states he had Some left shoulder discomfort and was worried about having a heart attack And made appointment to come in today.  He is not having chest pain at present  And is just anxious he states.  Review of Systems   C/o anxiety and diaphoresis, and left shoulder aches.  No chest pain, SOB, HA, dizziness, vision change, N/V, diarrhea, constipation, dysuria, urinary urgency or frequency or rash.  Objective:   Physical Exam Vital signs noted  Well developed well nourished male.  HEENT - Head atraumatic Normocephalic Respiratory - Lungs CTA bilateral Cardiac - RRR S1 and S2 without murmur GI - Abdomen soft Nontender and bowel sounds active x 4 Extremities - No edema. Neuro - Grossly intact.  EKG - NSR without acute ST-T changes.    Assessment & Plan:  Chest pain - Plan: EKG 12-Lead, escitalopram (LEXAPRO) 10 MG tablet, ALPRAZolam (XANAX) 1 MG tablet He is currently chest pain free.  Discussed with him if he experiences anymore anxiety he needs to take xanax. He may also need a stress test to r/o cardiac problems.  Reassured him at this time he is okay.  Take ASA 81mg  po qd He describes having left shoulder discomfort and not really any chest pain.  Depression - Plan: escitalopram (LEXAPRO) 10 MG table 2po qdt, ALPRAZolam (XANAX) 1 MG tablet Increase lexapro 10mg  2 po qd and follow up in 2 weeks.   Discussed options for him like getting some Rest and asking his daughters for help and resuming home health services so he can get a break.  He states He has looked into placing her in a skilled nursing facility but he cannot afford it.

## 2013-05-26 NOTE — Patient Instructions (Addendum)

## 2013-06-01 ENCOUNTER — Ambulatory Visit (INDEPENDENT_AMBULATORY_CARE_PROVIDER_SITE_OTHER): Payer: Medicare Other | Admitting: Nurse Practitioner

## 2013-06-01 ENCOUNTER — Encounter: Payer: Self-pay | Admitting: Nurse Practitioner

## 2013-06-01 VITALS — BP 103/65 | HR 66 | Temp 98.1°F | Ht 69.0 in | Wt 191.0 lb

## 2013-06-01 DIAGNOSIS — F411 Generalized anxiety disorder: Secondary | ICD-10-CM

## 2013-06-01 DIAGNOSIS — F410 Panic disorder [episodic paroxysmal anxiety] without agoraphobia: Secondary | ICD-10-CM

## 2013-06-01 MED ORDER — ESCITALOPRAM OXALATE 20 MG PO TABS: 20.0000 mg | ORAL_TABLET | Freq: Every day | ORAL | Status: DC

## 2013-06-01 NOTE — Progress Notes (Signed)
  Subjective:    Patient ID: Francisco [name], male    DOB: 1940-12-09, 72 y.o.   MRN: [id number]  Anxiety Presents for follow-up visit. Symptoms include chest pain (During his panic attacks), depressed mood, excessive worry, nervous/anxious behavior and panic. Symptoms occur occasionally. The severity of symptoms is interfering with daily activities. The quality of sleep is good.    Pt here for follow-up from a panic attack with chest pain that occurred one week ago. 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 Pt feels overwhelmed and anxious. Patient was seen by B.[place] NP last week and he increased his lexapro to 20 mg a day. aptient sats it has helped- he is going to be getting some help taking care of hs wife to allow him time to get out of haouse and do somethings.    Review of Systems  Cardiovascular: Positive for chest pain (During his panic attacks).  Psychiatric/Behavioral: The patient is nervous/anxious.   All other systems reviewed and are negative.       Objective:   Physical Exam  Constitutional: He is oriented to person, place, and time. He appears well-developed and well-nourished.  Cardiovascular: Normal rate, regular rhythm, normal heart sounds and intact distal pulses.   Pulmonary/Chest: Effort normal and breath sounds normal.  Neurological: He is alert and oriented to person, place, and time.  Psychiatric: He has a normal mood and affect. His speech is normal. Judgment and thought content normal. He is agitated. Cognition and memory are normal.  Pt states he is overwhelmed and tears up talking about caring for his wife at home.     BP 103/65  Pulse 66  Temp(Src) 98.1 F (36.7 C) (Oral)  Ht 5\' 9"  (1.753 m)  Wt 191 lb (86.637 kg)  BMI 28.19 kg/m2      Assessment & Plan:   1. GAD (generalized anxiety disorder)   2. Panic attacks    Continue lexapro as rx Stress management RTO in 1-2 months  Mary-Margaret Daphine Deutscher, FNP

## 2013-06-01 NOTE — Patient Instructions (Addendum)

## 2013-07-05 ENCOUNTER — Encounter: Payer: Self-pay | Admitting: *Deleted

## 2013-07-14 ENCOUNTER — Ambulatory Visit (INDEPENDENT_AMBULATORY_CARE_PROVIDER_SITE_OTHER): Payer: Medicare Other | Admitting: Nurse Practitioner

## 2013-07-14 ENCOUNTER — Encounter: Payer: Self-pay | Admitting: Nurse Practitioner

## 2013-07-14 VITALS — BP 121/69 | HR 70 | Temp 97.7°F | Ht 69.0 in | Wt 200.0 lb

## 2013-07-14 DIAGNOSIS — F32A Depression, unspecified: Secondary | ICD-10-CM

## 2013-07-14 DIAGNOSIS — K219 Gastro-esophageal reflux disease without esophagitis: Secondary | ICD-10-CM

## 2013-07-14 DIAGNOSIS — I10 Essential (primary) hypertension: Secondary | ICD-10-CM

## 2013-07-14 DIAGNOSIS — Z125 Encounter for screening for malignant neoplasm of prostate: Secondary | ICD-10-CM

## 2013-07-14 DIAGNOSIS — F410 Panic disorder [episodic paroxysmal anxiety] without agoraphobia: Secondary | ICD-10-CM

## 2013-07-14 DIAGNOSIS — F329 Major depressive disorder, single episode, unspecified: Secondary | ICD-10-CM

## 2013-07-14 DIAGNOSIS — E785 Hyperlipidemia, unspecified: Secondary | ICD-10-CM

## 2013-07-14 NOTE — Progress Notes (Signed)
Subjective:     Patient ID: Rowin Bayron, male   DOB: November 13, 1941, 72 y.o.   MRN: [id number]  Hypertension This is a chronic problem. The current episode started more than 1 year ago. The problem is unchanged. The problem is controlled. Pertinent negatives include no blurred vision, chest pain, headaches, neck pain, peripheral edema or shortness of breath. There are no associated agents to hypertension. Risk factors for coronary artery disease include dyslipidemia, male gender and obesity. Past treatments include ACE inhibitors. The current treatment provides significant improvement. There are no compliance problems.   Asthma There is no shortness of breath. This is a chronic problem. The current episode started more than 1 year ago. The problem occurs rarely. The problem has been unchanged. Pertinent negatives include no chest pain, headaches or sore throat. He reports significant improvement on treatment. His past medical history is significant for asthma.  Gastrophageal Reflux He reports no chest pain, no nausea or no sore throat. This is a chronic problem. The problem occurs rarely.  Hyperlipidemia This is a chronic problem. The current episode started more than 1 year ago. The problem is uncontrolled. Recent lipid tests were reviewed and are normal. He has no history of diabetes, hypothyroidism or obesity. Pertinent negatives include no chest pain or shortness of breath. Current antihyperlipidemic treatment includes statins and ezetimibe. The current treatment provides significant improvement of lipids. Compliance problems include adherence to diet.  Risk factors for coronary artery disease include hypertension and male sex.   *PAtient is the sole caregiver for his wife that had a stroke many years ago- he has been under a lot of stress lately But his daughters have recently started helping him some with her and he has been able to get out of house and do somethings on his own which has really  decreased his panic attacks and anxiety.   Review of Systems  HENT: Negative for sore throat and neck pain.   Eyes: Negative for blurred vision.  Respiratory: Negative for shortness of breath.   Cardiovascular: Negative for chest pain.  Gastrointestinal: Negative for nausea.  Neurological: Negative for headaches.  All other systems reviewed and are negative.       Objective:   Physical Exam  Constitutional: He is oriented to person, place, and time. He appears well-developed and well-nourished.  HENT:  Head: Normocephalic.  Right Ear: External ear normal.  Left Ear: External ear normal.  Nose: Nose normal.  Mouth/Throat: Oropharynx is clear and moist.  Eyes: EOM are normal. Pupils are equal, round, and reactive to light.  Neck: Normal range of motion. Neck supple. No thyromegaly present.  Cardiovascular: Normal rate, regular rhythm, normal heart sounds and intact distal pulses.   No murmur heard. Pulmonary/Chest: Effort normal and breath sounds normal. He has no wheezes. He has no rales.  Abdominal: Soft. Bowel sounds are normal.  Musculoskeletal: Normal range of motion.  Neurological: He is alert and oriented to person, place, and time.  Skin: Skin is warm and dry.  Psychiatric: He has a normal mood and affect. His behavior is normal. Judgment and thought content normal.   BP 121/69  Pulse 70  Temp(Src) 97.7 F (36.5 C) (Oral)  Ht 5\' 9"  (1.753 m)  Wt 200 lb (90.719 kg)  BMI 29.52 kg/m2      Assessment:     1. Panic attacks   2. Depression   3. HTN (hypertension)   4. GERD (gastroesophageal reflux disease)   5. Hyperlipidemia  Plan:     Orders Placed This Encounter  Procedures  . CMP14+EGFR  . NMR, lipoprofile  . PSA, total and free      Medication List       This list is accurate as of: 07/14/13 11:35 AM.  Always use your most recent med list.               ALPRAZolam 1 MG tablet  Commonly known as:  XANAX  One po qid prn anxiety      aspirin 81 MG tablet  Take 81 mg by mouth daily.     budesonide-formoterol 160-4.5 MCG/ACT inhaler  Commonly known as:  SYMBICORT  Inhale 2 puffs into the lungs 2 (two) times daily.     escitalopram 20 MG tablet  Commonly known as:  LEXAPRO  Take 1 tablet (20 mg total) by mouth daily.     ezetimibe-simvastatin 10-40 MG per tablet  Commonly known as:  VYTORIN  Take 1 tablet by mouth at bedtime.     fish oil-omega-3 fatty acids 1000 MG capsule  Take 2 g by mouth daily.     lisinopril 10 MG tablet  Commonly known as:  PRINIVIL,ZESTRIL  Take 10 mg by mouth daily.       Continue all meds  Labs pending Diet and exercise encouraged F/U in 3 months  Mary-Margaret Daphine Deutscher, FNP

## 2013-07-14 NOTE — Patient Instructions (Signed)
Stress Stress-related medical problems are becoming increasingly common. The body has a built-in physical response to stressful situations. Faced with pressure, challenge or danger, we need to react quickly. Our bodies release hormones such as cortisol and adrenaline to help do this. These hormones are part of the "fight or flight" response and affect the metabolic rate, heart rate and blood pressure, resulting in a heightened, stressed state that prepares the body for optimum performance in dealing with a stressful situation. It is likely that early man required these mechanisms to stay alive, but usually modern stresses do not call for this, and the same hormones released in today's world can damage health and reduce coping ability. CAUSES  Pressure to perform at work, at school or in sports.  Threats of physical violence.  Money worries.  Arguments.  Family conflicts.  Divorce or separation from significant other.  Bereavement.  New job or unemployment.  Changes in location.  Alcohol or drug abuse. SOMETIMES, THERE IS NO PARTICULAR REASON FOR DEVELOPING STRESS. Almost all people are at risk of being stressed at some time in their lives. It is important to know that some stress is temporary and some is long term.  Temporary stress will go away when a situation is resolved. Most people can cope with short periods of stress, and it can often be relieved by relaxing, taking a walk, chatting through issues with friends, or having a good night's sleep.  Chronic (long-term, continuous) stress is much harder to deal with. It can be psychologically and emotionally damaging. It can be harmful both for an individual and for friends and family. SYMPTOMS Everyone reacts to stress differently. There are some common effects that help us recognize it. In times of extreme stress, people may:  Shake uncontrollably.  Breathe faster and deeper than normal (hyperventilate).  Vomit.  For people  with asthma, stress can trigger an attack.  For some people, stress may trigger migraine headaches, ulcers, and body pain. PHYSICAL EFFECTS OF STRESS MAY INCLUDE:  Loss of energy.  Skin problems.  Aches and pains resulting from tense muscles, including neck ache, backache and tension headaches.  Increased pain from arthritis and other conditions.  Irregular heart beat (palpitations).  Periods of irritability or anger.  Apathy or depression.  Anxiety (feeling uptight or worrying).  Unusual behavior.  Loss of appetite.  Comfort eating.  Lack of concentration.  Loss of, or decreased, sex-drive.  Increased smoking, drinking, or recreational drug use.  For women, missed periods.  Ulcers, joint pain, and muscle pain. Post-traumatic stress is the stress caused by any serious accident, strong emotional damage, or extremely difficult or violent experience such as rape or war. Post-traumatic stress victims can experience mixtures of emotions such as fear, shame, depression, guilt or anger. It may include recurrent memories or images that may be haunting. These feelings can last for weeks, months or even years after the traumatic event that triggered them. Specialized treatment, possibly with medicines and psychological therapies, is available. If stress is causing physical symptoms, severe distress or making it difficult for you to function as normal, it is worth seeing your caregiver. It is important to remember that although stress is a usual part of life, extreme or prolonged stress can lead to other illnesses that will need treatment. It is better to visit a doctor sooner rather than later. Stress has been linked to the development of high blood pressure and heart disease, as well as insomnia and depression. There is no diagnostic test for   stress since everyone reacts to it differently. But a caregiver will be able to spot the physical symptoms, such  as:  Headaches.  Shingles.  Ulcers. Emotional distress such as intense worry, low mood or irritability should be detected when the doctor asks pertinent questions to identify any underlying problems that might be the cause. In case there are physical reasons for the symptoms, the doctor may also want to do some tests to exclude certain conditions. If you feel that you are suffering from stress, try to identify the aspects of your life that are causing it. Sometimes you may not be able to change or avoid them, but even a small change can have a positive ripple effect. A simple lifestyle change can make all the difference. STRATEGIES THAT CAN HELP DEAL WITH STRESS:  Delegating or sharing responsibilities.  Avoiding confrontations.  Learning to be more assertive.  Regular exercise.  Avoid using alcohol or street drugs to cope.  Eating a healthy, balanced diet, rich in fruit and vegetables and proteins.  Finding humor or absurdity in stressful situations.  Never taking on more than you know you can handle comfortably.  Organizing your time better to get as much done as possible.  Talking to friends or family and sharing your thoughts and fears.  Listening to music or relaxation tapes.  Tensing and then relaxing your muscles, starting at the toes and working up to the head and neck. If you think that you would benefit from help, either in identifying the things that are causing your stress or in learning techniques to help you relax, see a caregiver who is capable of helping you with this. Rather than relying on medications, it is usually better to try and identify the things in your life that are causing stress and try to deal with them. There are many techniques of managing stress including counseling, psychotherapy, aromatherapy, yoga, and exercise. Your caregiver can help you determine what is best for you. Document Released: 01/23/2003 Document Revised: 01/25/2012 Document  Reviewed: 12/20/2007 [employer] Patient Information 2014 [employer].  

## 2013-07-18 LAB — NMR, LIPOPROFILE
Cholesterol: 121 mg/dL
HDL Cholesterol by NMR: 47 mg/dL
HDL Particle Number: 36.7 umol/L
LDL Particle Number: 527 nmol/L
LDL Size: 19.6 nm — ABNORMAL LOW
LDLC SERPL CALC-MCNC: 45 mg/dL
LP-IR Score: 52 — ABNORMAL HIGH
Small LDL Particle Number: 527 nmol/L
Triglycerides by NMR: 146 mg/dL

## 2013-07-18 LAB — CMP14+EGFR
ALT: 12 IU/L (ref 0–44)
AST: 16 IU/L (ref 0–40)
Albumin/Globulin Ratio: 3 — ABNORMAL HIGH (ref 1.1–2.5)
Albumin: 4.2 g/dL (ref 3.5–4.8)
Alkaline Phosphatase: 38 IU/L — ABNORMAL LOW (ref 39–117)
BUN/Creatinine Ratio: 21 (ref 10–22)
BUN: 22 mg/dL (ref 8–27)
CO2: 24 mmol/L (ref 18–29)
Calcium: 8.6 mg/dL (ref 8.6–10.2)
Chloride: 106 mmol/L (ref 97–108)
Creatinine, Ser: 1.04 mg/dL (ref 0.76–1.27)
GFR calc Af Amer: 83 mL/min/1.73
GFR calc non Af Amer: 71 mL/min/1.73
Globulin, Total: 1.4 g/dL — ABNORMAL LOW (ref 1.5–4.5)
Glucose: 89 mg/dL (ref 65–99)
Potassium: 4.3 mmol/L (ref 3.5–5.2)
Sodium: 143 mmol/L (ref 134–144)
Total Bilirubin: 0.6 mg/dL (ref 0.0–1.2)
Total Protein: 5.6 g/dL — ABNORMAL LOW (ref 6.0–8.5)

## 2013-07-18 LAB — PSA, TOTAL AND FREE
PSA, Free Pct: 34 %
PSA, Free: 0.34 ng/mL
PSA: 1 ng/mL (ref 0.0–4.0)

## 2013-07-19 ENCOUNTER — Ambulatory Visit: Payer: Medicare Other | Admitting: Nurse Practitioner

## 2013-08-07 ENCOUNTER — Other Ambulatory Visit: Payer: Self-pay

## 2013-08-07 MED ORDER — LISINOPRIL 10 MG PO TABS: 10.0000 mg | ORAL_TABLET | Freq: Every day | ORAL | Status: DC

## 2013-08-10 ENCOUNTER — Telehealth: Payer: Self-pay | Admitting: Nurse Practitioner

## 2013-08-10 NOTE — Telephone Encounter (Signed)
Aware samples ready.

## 2013-08-28 ENCOUNTER — Ambulatory Visit (INDEPENDENT_AMBULATORY_CARE_PROVIDER_SITE_OTHER): Payer: Medicare Other

## 2013-08-28 DIAGNOSIS — Z23 Encounter for immunization: Secondary | ICD-10-CM

## 2013-10-04 ENCOUNTER — Other Ambulatory Visit: Payer: Self-pay | Admitting: Family Medicine

## 2013-10-09 ENCOUNTER — Telehealth: Payer: Self-pay | Admitting: *Deleted

## 2013-10-09 MED ORDER — LISINOPRIL 10 MG PO TABS: 10.0000 mg | ORAL_TABLET | Freq: Every day | ORAL | Status: DC

## 2013-10-09 MED ORDER — ESCITALOPRAM OXALATE 20 MG PO TABS: 20.0000 mg | ORAL_TABLET | Freq: Every day | ORAL | Status: DC

## 2013-10-09 MED ORDER — EZETIMIBE-SIMVASTATIN 10-40 MG PO TABS: 1.0000 | ORAL_TABLET | Freq: Every day | ORAL | Status: DC

## 2013-10-09 MED ORDER — BUDESONIDE-FORMOTEROL FUMARATE 160-4.5 MCG/ACT IN AERO: 2.0000 | INHALATION_SPRAY | Freq: Two times a day (BID) | RESPIRATORY_TRACT | Status: DC

## 2013-10-09 NOTE — Telephone Encounter (Signed)
rx sent in for 90 day supply.   

## 2013-10-09 NOTE — Telephone Encounter (Signed)
rx called into pharmacy

## 2013-10-09 NOTE — Telephone Encounter (Signed)
Pt says, he and his wife reallly need 90 supply on all meds sent to [facility], he has to leave her alone when he goes to get them

## 2013-10-18 ENCOUNTER — Ambulatory Visit (INDEPENDENT_AMBULATORY_CARE_PROVIDER_SITE_OTHER): Payer: Medicare Other

## 2013-10-18 ENCOUNTER — Ambulatory Visit (INDEPENDENT_AMBULATORY_CARE_PROVIDER_SITE_OTHER): Payer: Medicare Other | Admitting: Nurse Practitioner

## 2013-10-18 ENCOUNTER — Encounter: Payer: Self-pay | Admitting: Nurse Practitioner

## 2013-10-18 VITALS — BP 127/78 | HR 62 | Temp 97.7°F | Ht 69.0 in | Wt 198.0 lb

## 2013-10-18 DIAGNOSIS — F411 Generalized anxiety disorder: Secondary | ICD-10-CM

## 2013-10-18 DIAGNOSIS — R109 Unspecified abdominal pain: Secondary | ICD-10-CM

## 2013-10-18 DIAGNOSIS — I10 Essential (primary) hypertension: Secondary | ICD-10-CM

## 2013-10-18 DIAGNOSIS — K219 Gastro-esophageal reflux disease without esophagitis: Secondary | ICD-10-CM

## 2013-10-18 DIAGNOSIS — E785 Hyperlipidemia, unspecified: Secondary | ICD-10-CM

## 2013-10-18 DIAGNOSIS — Z23 Encounter for immunization: Secondary | ICD-10-CM

## 2013-10-18 NOTE — Progress Notes (Signed)
Subjective:     Patient ID: Francisco [name], male   DOB: 09-14-1941, 72 y.o.   MRN: [id number]  Hypertension This is a chronic problem. The current episode started more than 1 year ago. The problem is unchanged. The problem is controlled. Pertinent negatives include no blurred vision, chest pain, headaches, neck pain, peripheral edema or shortness of breath. There are no associated agents to hypertension. Risk factors for coronary artery disease include dyslipidemia, male gender and obesity. Past treatments include ACE inhibitors. The current treatment provides significant improvement. There are no compliance problems.   Asthma There is no shortness of breath. This is a chronic problem. The current episode started more than 1 year ago. The problem occurs rarely. The problem has been unchanged. Pertinent negatives include no chest pain, headaches or sore throat. He reports significant improvement on treatment. His past medical history is significant for asthma.  Gastrophageal Reflux He reports no chest pain, no nausea or no sore throat. This is a chronic problem. The problem occurs rarely.  Hyperlipidemia This is a chronic problem. The current episode started more than 1 year ago. The problem is uncontrolled. Recent lipid tests were reviewed and are normal. He has no history of diabetes, hypothyroidism or obesity. Pertinent negatives include no chest pain or shortness of breath. Current antihyperlipidemic treatment includes statins and ezetimibe. The current treatment provides significant improvement of lipids. Compliance problems include adherence to diet.  Risk factors for coronary artery disease include hypertension and male sex.  GAD Patient has been anxious and was seen several months ago but seems to be doing better on lexapro.  * Right flank pain- constant- worse at times  Review of Systems  HENT: Negative for sore throat.   Eyes: Negative for blurred vision.  Respiratory: Negative for shortness  of breath.   Cardiovascular: Negative for chest pain.  Gastrointestinal: Negative for nausea.  Musculoskeletal: Negative for neck pain.  Neurological: Negative for headaches.  All other systems reviewed and are negative.       Objective:   Physical Exam  Constitutional: He is oriented to person, place, and time. He appears well-developed and well-nourished.  HENT:  Head: Normocephalic.  Right Ear: External ear normal.  Left Ear: External ear normal.  Nose: Nose normal.  Mouth/Throat: Oropharynx is clear and moist.  Eyes: EOM are normal. Pupils are equal, round, and reactive to light.  Neck: Normal range of motion. Neck supple. No thyromegaly present.  Cardiovascular: Normal rate, regular rhythm, normal heart sounds and intact distal pulses.   No murmur heard. Pulmonary/Chest: Effort normal and breath sounds normal. He has no wheezes. He has no rales.  Abdominal: Soft. Bowel sounds are normal.  Musculoskeletal: Normal range of motion.  Neurological: He is alert and oriented to person, place, and time.  Skin: Skin is warm and dry.  Psychiatric: He has a normal mood and affect. His behavior is normal. Judgment and thought content normal.   BP 127/78  Pulse 62  Temp(Src) 97.7 F (36.5 C) (Oral)  Ht 5\' 9"  (1.753 m)  Wt 198 lb (89.812 kg)  BMI 29.23 kg/m2       Assessment:     1. Hypertension   2. Hyperlipidemia   3. GERD (gastroesophageal reflux disease)   4. GAD (generalized anxiety disorder)         Plan:      Orders Placed This Encounter  Procedures  . DG Abd 1 View    Standing Status: Future     Number of  Occurrences: 1     Standing Expiration Date: 12/18/2014    Order Specific Question:  Reason for Exam (SYMPTOM  OR DIAGNOSIS REQUIRED)    Answer:  right flank    Order Specific Question:  Preferred imaging location?    Answer:  Internal  . CT Abdomen Pelvis W Contrast    Standing Status: Future     Number of Occurrences:      Standing Expiration Date:  01/17/2015    Order Specific Question:  Reason for Exam (SYMPTOM  OR DIAGNOSIS REQUIRED)    Answer:  right flank pain    Order Specific Question:  Preferred imaging location?    Answer:  [facility]  . CMP14+EGFR  . NMR, lipoprofile      Continue all meds Labs pending Diet and exercise encouraged Health maintenance reviewed Follow up in 3 month  Mary-Margaret Daphine Deutscher, FNP

## 2013-10-18 NOTE — Patient Instructions (Signed)

## 2013-10-20 LAB — CMP14+EGFR
ALT: 15 IU/L (ref 0–44)
AST: 19 IU/L (ref 0–40)
Albumin/Globulin Ratio: 3.2 — ABNORMAL HIGH (ref 1.1–2.5)
Albumin: 4.2 g/dL (ref 3.5–4.8)
Alkaline Phosphatase: 36 IU/L — ABNORMAL LOW (ref 39–117)
BUN: 20 mg/dL (ref 8–27)
CO2: 25 mmol/L (ref 18–29)
Calcium: 8.7 mg/dL (ref 8.6–10.2)
Chloride: 104 mmol/L (ref 97–108)
Creatinine, Ser: 1.12 mg/dL (ref 0.76–1.27)
GFR calc Af Amer: 75 mL/min/{1.73_m2} (ref >59)
GFR calc non Af Amer: 65 mL/min/{1.73_m2} (ref >59)
Glucose: 99 mg/dL (ref 65–99)
Potassium: 4.2 mmol/L (ref 3.5–5.2)
Sodium: 145 mmol/L — ABNORMAL HIGH (ref 134–144)

## 2013-10-20 LAB — NMR, LIPOPROFILE
Cholesterol: 111 mg/dL (ref <200)
HDL Cholesterol by NMR: 50 mg/dL (ref >=40)
HDL Particle Number: 40 umol/L (ref >=30.5)
LDL Size: 19.8 nm — ABNORMAL LOW (ref >20.5)
LP-IR Score: 48 — ABNORMAL HIGH (ref <=45)
Small LDL Particle Number: 839 nmol/L — ABNORMAL HIGH (ref <=527)
Triglycerides by NMR: 121 mg/dL (ref <150)

## 2013-10-22 ENCOUNTER — Other Ambulatory Visit: Payer: Self-pay | Admitting: Family Medicine

## 2013-10-24 NOTE — Telephone Encounter (Signed)
Please call in xanax rx 

## 2013-10-24 NOTE — Telephone Encounter (Signed)
Refill called to pharmacist at [facility]

## 2013-10-24 NOTE — Telephone Encounter (Signed)
Pt just seen last week , last filled 05/26/13 with 3 RF. Call into [facility]

## 2013-10-25 ENCOUNTER — Telehealth: Payer: Self-pay | Admitting: Nurse Practitioner

## 2013-10-25 NOTE — Telephone Encounter (Signed)
Please advise 

## 2013-10-25 NOTE — Telephone Encounter (Signed)
Corrected at pharmacy

## 2013-10-26 NOTE — Telephone Encounter (Signed)
Patient aware.

## 2013-11-14 ENCOUNTER — Ambulatory Visit (HOSPITAL_COMMUNITY): Payer: Medicare Other

## 2013-12-07 ENCOUNTER — Other Ambulatory Visit: Payer: Self-pay | Admitting: Nurse Practitioner

## 2013-12-11 NOTE — Telephone Encounter (Signed)
rx called to pharmacy 

## 2014-01-17 ENCOUNTER — Ambulatory Visit (INDEPENDENT_AMBULATORY_CARE_PROVIDER_SITE_OTHER): Payer: Medicare Other | Admitting: Nurse Practitioner

## 2014-01-17 ENCOUNTER — Encounter: Payer: Self-pay | Admitting: Nurse Practitioner

## 2014-01-17 VITALS — BP 145/76 | HR 76 | Temp 97.6°F | Ht 69.0 in | Wt 205.0 lb

## 2014-01-17 DIAGNOSIS — F411 Generalized anxiety disorder: Secondary | ICD-10-CM

## 2014-01-17 DIAGNOSIS — E785 Hyperlipidemia, unspecified: Secondary | ICD-10-CM

## 2014-01-17 DIAGNOSIS — I10 Essential (primary) hypertension: Secondary | ICD-10-CM

## 2014-01-17 DIAGNOSIS — K219 Gastro-esophageal reflux disease without esophagitis: Secondary | ICD-10-CM

## 2014-01-17 MED ORDER — SIMVASTATIN 40 MG PO TABS: 40.0000 mg | ORAL_TABLET | Freq: Every day | ORAL | Status: DC

## 2014-01-17 MED ORDER — LISINOPRIL 10 MG PO TABS: 10.0000 mg | ORAL_TABLET | Freq: Every day | ORAL | Status: DC

## 2014-01-17 NOTE — Progress Notes (Signed)
Subjective:     Patient ID: Francisco [name], male   DOB: 12-31-1940, 73 y.o.   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 He is able to get out of the house more to do activities he enjoys doing.  Hypertension This is a chronic problem. The current episode started more than 1 year ago. The problem is unchanged. The problem is controlled. Pertinent negatives include no blurred vision, chest pain, headaches, neck pain, palpitations, peripheral edema or shortness of breath. There are no associated agents to hypertension. Risk factors for coronary artery disease include dyslipidemia, male gender and obesity. Past treatments include ACE inhibitors. The current treatment provides significant improvement. There are no compliance problems.   Asthma There is no shortness of breath. This is a chronic problem. The current episode started more than 1 year ago. The problem occurs rarely. The problem has been unchanged. Pertinent negatives include no chest pain, headaches or sore throat. He reports significant improvement on treatment. His past medical history is significant for asthma.  Gastrophageal Reflux He reports no chest pain, no nausea or no sore throat. This is a chronic problem. The problem occurs rarely.  Hyperlipidemia This is a chronic problem. The current episode started more than 1 year ago. The problem is uncontrolled. Recent lipid tests were reviewed and are normal. He has no history of diabetes, hypothyroidism or obesity. Pertinent negatives include no chest pain or shortness of breath. Current antihyperlipidemic treatment includes statins and ezetimibe. The current treatment provides significant improvement of lipids. Compliance problems include adherence to diet.  Risk factors for coronary artery disease include hypertension and male sex.   GAD Condition is improving. Has not had any attacks in 3 or 4 months. Takes Xanax as needed.   Review of Systems  HENT: Negative for sore throat.   Eyes: Negative for blurred vision.  Respiratory: Negative for shortness of breath.   Cardiovascular: Negative for chest pain and palpitations.  Gastrointestinal: Negative for nausea and constipation.  Musculoskeletal: Negative for neck pain.  Neurological: Negative for headaches.  Psychiatric/Behavioral: Negative for sleep disturbance. The patient is not nervous/anxious.   All other systems reviewed and are negative.       Objective:   Physical Exam  Constitutional: He is oriented to person, place, and time. He appears well-developed and well-nourished.  HENT:  Head: Normocephalic.  Nose: Nose normal.  Mouth/Throat: Oropharynx is clear and moist.  Eyes: EOM are normal.  Neck: Normal range of motion. Neck supple. No thyromegaly present.  Cardiovascular: Normal rate, regular rhythm, normal heart sounds and intact distal pulses.   No murmur heard. Pulmonary/Chest: Effort normal and breath sounds normal. He has no wheezes. He has no rales.  Abdominal: Soft. Bowel sounds are normal.  Musculoskeletal: Normal range of motion.  Neurological: He is alert and oriented to person, place, and time.  Skin: Skin is warm and dry.  Psychiatric: He has a normal mood and affect. His behavior is normal. Judgment and thought content normal.   BP 145/76  Pulse 76  Temp(Src) 97.6 F (36.4 C) (Oral)  Ht 5' 9"  (1.753 m)  Wt 205 lb (92.987 kg)  BMI 30.26 kg/m2       Assessment:     Plan:     1. Hyperlipidemia   2. Hypertension   3. GERD (gastroesophageal reflux  disease)   4. GAD (generalized anxiety disorder)    Orders Placed This Encounter  Procedures  . CMP14+EGFR  . NMR, lipoprofile   Meds ordered this encounter  Medications  . simvastatin (ZOCOR) 40 MG tablet    Sig: Take 1 tablet (40 mg total) by mouth at bedtime.    Dispense:   90 tablet    Refill:  1    Order Specific Question:  Supervising Provider    Answer:  Chipper Herb [[id number]]  . lisinopril (PRINIVIL,ZESTRIL) 10 MG tablet    Sig: Take 1 tablet (10 mg total) by mouth daily.    Dispense:  90 tablet    Refill:  1    Order Specific Question:  Supervising Provider    Answer:  Chipper Herb [[id number]]    Labs pending Health maintenance reviewed Diet and exercise encouraged Continue all meds Follow up  In 3 months   [place], FNP

## 2014-01-17 NOTE — Patient Instructions (Signed)

## 2014-01-19 LAB — NMR, LIPOPROFILE
Cholesterol: 126 mg/dL (ref <200)
HDL Cholesterol by NMR: 49 mg/dL (ref >=40)
HDL Particle Number: 38.6 umol/L (ref >=30.5)
LDL Particle Number: 697 nmol/L (ref <1000)
LDL SIZE: 20 nm — AB (ref >20.5)
LDLC SERPL CALC-MCNC: 47 mg/dL (ref <100)
LP-IR SCORE: 30 (ref <=45)
SMALL LDL PARTICLE NUMBER: 460 nmol/L (ref <=527)
TRIGLYCERIDES BY NMR: 151 mg/dL — AB (ref <150)

## 2014-01-19 LAB — CMP14+EGFR
ALT: 20 IU/L (ref 0–44)
AST: 21 IU/L (ref 0–40)
Albumin/Globulin Ratio: 3.5 — ABNORMAL HIGH (ref 1.1–2.5)
Albumin: 4.5 g/dL (ref 3.5–4.8)
Alkaline Phosphatase: 40 IU/L (ref 39–117)
BUN/Creatinine Ratio: 14 (ref 10–22)
BUN: 17 mg/dL (ref 8–27)
CALCIUM: 8.9 mg/dL (ref 8.6–10.2)
CO2: 25 mmol/L (ref 18–29)
Chloride: 103 mmol/L (ref 97–108)
Creatinine, Ser: 1.18 mg/dL (ref 0.76–1.27)
GFR calc Af Amer: 71 mL/min/{1.73_m2} (ref >59)
GFR calc non Af Amer: 61 mL/min/{1.73_m2} (ref >59)
Globulin, Total: 1.3 g/dL — ABNORMAL LOW (ref 1.5–4.5)
Glucose: 100 mg/dL — ABNORMAL HIGH (ref 65–99)
Potassium: 4.7 mmol/L (ref 3.5–5.2)
Sodium: 144 mmol/L (ref 134–144)
Total Bilirubin: 0.5 mg/dL (ref 0.0–1.2)
Total Protein: 5.8 g/dL — ABNORMAL LOW (ref 6.0–8.5)

## 2014-01-22 ENCOUNTER — Other Ambulatory Visit: Payer: Self-pay | Admitting: Nurse Practitioner

## 2014-01-23 NOTE — Telephone Encounter (Signed)
Last seen 01/17/14  MMM If approved route to nurse to call into [facility]

## 2014-01-23 NOTE — Telephone Encounter (Signed)
rx called into pharmacy

## 2014-01-23 NOTE — Telephone Encounter (Signed)
Please call in xanax with 1 refills 

## 2014-02-26 ENCOUNTER — Other Ambulatory Visit: Payer: Self-pay | Admitting: Nurse Practitioner

## 2014-02-28 ENCOUNTER — Other Ambulatory Visit: Payer: Self-pay | Admitting: *Deleted

## 2014-03-28 ENCOUNTER — Other Ambulatory Visit: Payer: Self-pay | Admitting: Nurse Practitioner

## 2014-03-28 NOTE — Telephone Encounter (Signed)
Last seen 01/17/14 last refill 02/26/14 for #100

## 2014-03-29 NOTE — Telephone Encounter (Signed)
Called into [facility]

## 2014-03-29 NOTE — Telephone Encounter (Signed)
Please call in xanax with 1 refills 

## 2014-05-02 ENCOUNTER — Encounter: Payer: Self-pay | Admitting: Nurse Practitioner

## 2014-05-02 ENCOUNTER — Ambulatory Visit (INDEPENDENT_AMBULATORY_CARE_PROVIDER_SITE_OTHER): Payer: Medicare Other | Admitting: Nurse Practitioner

## 2014-05-02 ENCOUNTER — Other Ambulatory Visit: Payer: Self-pay | Admitting: Nurse Practitioner

## 2014-05-02 VITALS — BP 140/64 | HR 75 | Temp 98.1°F | Ht 69.0 in | Wt 205.0 lb

## 2014-05-02 DIAGNOSIS — I10 Essential (primary) hypertension: Secondary | ICD-10-CM

## 2014-05-02 DIAGNOSIS — E785 Hyperlipidemia, unspecified: Secondary | ICD-10-CM

## 2014-05-02 DIAGNOSIS — F411 Generalized anxiety disorder: Secondary | ICD-10-CM

## 2014-05-02 NOTE — Progress Notes (Signed)
Subjective:     Patient ID: Francisco [name], male   DOB: 10-10-1941, 74 y.o.   MRN: [id number]  Patient here today for chronic follow up.   Hypertension This is a chronic problem. The current episode started more than 1 year ago. The problem is unchanged. The problem is controlled. Pertinent negatives include no blurred vision, chest pain, headaches, neck pain, peripheral edema or shortness of breath. There are no associated agents to hypertension. Risk factors for coronary artery disease include dyslipidemia, male gender and obesity. Past treatments include ACE inhibitors. The current treatment provides significant improvement. There are no compliance problems.   Asthma There is no shortness of breath. This is a chronic problem. The current episode started more than 1 year ago. The problem occurs rarely. The problem has been unchanged. Pertinent negatives include no chest pain, headaches or sore throat. He reports significant improvement on treatment. His past medical history is significant for asthma.  Gastrophageal Reflux He reports no chest pain, no nausea or no sore throat. This is a chronic problem. The problem occurs rarely.  Hyperlipidemia This is a chronic problem. The current episode started more than 1 year ago. The problem is uncontrolled. Recent lipid tests were reviewed and are normal. He has no history of diabetes, hypothyroidism or obesity. Pertinent negatives include no chest pain or shortness of breath. Current antihyperlipidemic treatment includes statins and ezetimibe. The current treatment provides significant improvement of lipids. Compliance problems include adherence to diet.  Risk factors for coronary artery disease include hypertension and male sex.  GAD Patient has been anxious and was seen several months ago but seems to be doing better on lexapro.   Review of Systems  HENT: Negative for sore throat.   Eyes: Negative for blurred vision.  Respiratory: Negative for shortness  of breath.   Cardiovascular: Negative for chest pain.  Gastrointestinal: Negative for nausea.  Musculoskeletal: Negative for neck pain.  Neurological: Negative for headaches.  All other systems reviewed and are negative.      Objective:   Physical Exam  Constitutional: He is oriented to person, place, and time. He appears well-developed and well-nourished.  HENT:  Head: Normocephalic.  Right Ear: External ear normal.  Left Ear: External ear normal.  Nose: Nose normal.  Mouth/Throat: Oropharynx is clear and moist.  Eyes: EOM are normal. Pupils are equal, round, and reactive to light.  Neck: Normal range of motion. Neck supple. No thyromegaly present.  Cardiovascular: Normal rate, regular rhythm, normal heart sounds and intact distal pulses.   No murmur heard. Pulmonary/Chest: Effort normal and breath sounds normal. He has no wheezes. He has no rales.  Abdominal: Soft. Bowel sounds are normal.  Musculoskeletal: Normal range of motion.  Neurological: He is alert and oriented to person, place, and time.  Skin: Skin is warm and dry.  Psychiatric: He has a normal mood and affect. His behavior is normal. Judgment and thought content normal.   BP 140/64  Pulse 75  Temp(Src) 98.1 F (36.7 C) (Oral)  Ht $R'5\' 9"'bn$  (1.753 m)  Wt 205 lb (92.987 kg)  BMI 30.26 kg/m2         Assessment:   1. Hypertension   2. Hyperlipidemia   3. GAD (generalized anxiety disorder)    Orders Placed This Encounter  Procedures  . NMR, lipoprofile  . BMP8+EGFR    Labs pending Health maintenance reviewed Diet and exercise encouraged Continue all meds Follow up  In 3 months   [place], FNP   Mary-Margaret  Hassell Done, [place]

## 2014-05-02 NOTE — Patient Instructions (Signed)
Health Maintenance A healthy lifestyle and preventative care can promote health and wellness.  Maintain regular health, dental, and eye exams.  Eat a healthy diet. Foods like vegetables, fruits, whole grains, low-fat dairy products, and lean protein foods contain the nutrients you need and are low in calories. Decrease your intake of foods high in solid fats, added sugars, and salt. Get information about a proper diet from your health care Illianna Paschal, if necessary.  Regular physical exercise is one of the most important things you can do for your health. Most adults should get at least 150 minutes of moderate-intensity exercise (any activity that increases your heart rate and causes you to sweat) each week. In addition, most adults need muscle-strengthening exercises on 2 or more days a week.   Maintain a healthy weight. The body mass index (BMI) is a screening tool to identify possible weight problems. It provides an estimate of body fat based on height and weight. Your health care Estephany Perot can find your BMI and can help you achieve or maintain a healthy weight. For males 20 years and older:  A BMI below 18.5 is considered underweight.  A BMI of 18.5 to 24.9 is normal.  A BMI of 25 to 29.9 is considered overweight.  A BMI of 30 and above is considered obese.  Maintain normal blood lipids and cholesterol by exercising and minimizing your intake of saturated fat. Eat a balanced diet with plenty of fruits and vegetables. Blood tests for lipids and cholesterol should begin at age 20 and be repeated every 5 years. If your lipid or cholesterol levels are high, you are over age 50, or you are at high risk for heart disease, you may need your cholesterol levels checked more frequently.Ongoing high lipid and cholesterol levels should be treated with medicines if diet and exercise are not working.  If you smoke, find out from your health care Noele Icenhour how to quit. If you do not use tobacco, do not  start.  Lung cancer screening is recommended for adults aged 55-80 years who are at high risk for developing lung cancer because of a history of smoking. A yearly low-dose CT scan of the lungs is recommended for people who have at least a 30-pack-year history of smoking and are current smokers or have quit within the past 15 years. A pack year of smoking is smoking an average of 1 pack of cigarettes a day for 1 year (for example, a 30-pack-year history of smoking could mean smoking 1 pack a day for 30 years or 2 packs a day for 15 years). Yearly screening should continue until the smoker has stopped smoking for at least 15 years. Yearly screening should be stopped for people who develop a health problem that would prevent them from having lung cancer treatment.  If you choose to drink alcohol, do not have more than 2 drinks per day. One drink is considered to be 12 oz (360 mL) of beer, 5 oz (150 mL) of wine, or 1.5 oz (45 mL) of liquor.  Avoid the use of street drugs. Do not share needles with anyone. Ask for help if you need support or instructions about stopping the use of drugs.  High blood pressure causes heart disease and increases the risk of stroke. Blood pressure should be checked at least every 1-2 years. Ongoing high blood pressure should be treated with medicines if weight loss and exercise are not effective.  If you are 45-79 years old, ask your health care Margarethe Virgen if   you should take aspirin to prevent heart disease.  Diabetes screening involves taking a blood sample to check your fasting blood sugar level. This should be done once every 3 years after age 45 if you are at a normal weight and without risk factors for diabetes. Testing should be considered at a younger age or be carried out more frequently if you are overweight and have at least 1 risk factor for diabetes.  Colorectal cancer can be detected and often prevented. Most routine colorectal cancer screening begins at the age of 50  and continues through age 75. However, your health care Elveria Lauderbaugh may recommend screening at an earlier age if you have risk factors for colon cancer. On a yearly basis, your health care Charie Pinkus may provide home test kits to check for hidden blood in the stool. A small camera at the end of a tube may be used to directly examine the colon (sigmoidoscopy or colonoscopy) to detect the earliest forms of colorectal cancer. Talk to your health care Nolan Lasser about this at age 50 when routine screening begins. A direct exam of the colon should be repeated every 5-10 years through age 75, unless early forms of precancerous polyps or small growths are found.  People who are at an increased risk for hepatitis B should be screened for this virus. You are considered at high risk for hepatitis B if:  You were born in a country where hepatitis B occurs often. Talk with your health care Aryn Kops about which countries are considered high risk.  Your parents were born in a high-risk country and you have not received a shot to protect against hepatitis B (hepatitis B vaccine).  You have HIV or AIDS.  You use needles to inject street drugs.  You live with, or have sex with, someone who has hepatitis B.  You are a man who has sex with other men (MSM).  You get hemodialysis treatment.  You take certain medicines for conditions like cancer, organ transplantation, and autoimmune conditions.  Hepatitis C blood testing is recommended for all people born from 1945 through 1965 and any individual with known risk factors for hepatitis C.  Healthy men should no longer receive prostate-specific antigen (PSA) blood tests as part of routine cancer screening. Talk to your health care Garrit Marrow about prostate cancer screening.  Testicular cancer screening is not recommended for adolescents or adult males who have no symptoms. Screening includes self-exam, a health care Knut Rondinelli exam, and other screening tests. Consult with your  health care Briele Lagasse about any symptoms you have or any concerns you have about testicular cancer.  Practice safe sex. Use condoms and avoid high-risk sexual practices to reduce the spread of sexually transmitted infections (STIs).  You should be screened for STIs, including gonorrhea and chlamydia if:  You are sexually active and are younger than 24 years.  You are older than 24 years, and your health care Mistee Soliman tells you that you are at risk for this type of infection.  Your sexual activity has changed since you were last screened, and you are at an increased risk for chlamydia or gonorrhea. Ask your health care Lamiah Marmol if you are at risk.  If you are at risk of being infected with HIV, it is recommended that you take a prescription medicine daily to prevent HIV infection. This is called pre-exposure prophylaxis (PrEP). You are considered at risk if:  You are a man who has sex with other men (MSM).  You are a heterosexual man who   is sexually active with multiple partners.  You take drugs by injection.  You are sexually active with a partner who has HIV.  Talk with your health care Kel Senn about whether you are at high risk of being infected with HIV. If you choose to begin PrEP, you should first be tested for HIV. You should then be tested every 3 months for as long as you are taking PrEP.  Use sunscreen. Apply sunscreen liberally and repeatedly throughout the day. You should seek shade when your shadow is shorter than you. Protect yourself by wearing long sleeves, pants, a wide-brimmed hat, and sunglasses year round whenever you are outdoors.  Tell your health care Alphonso Gregson of new moles or changes in moles, especially if there is a change in shape or color. Also, tell your health care Markelle Asaro if a mole is larger than the size of a pencil eraser.  A one-time screening for abdominal aortic aneurysm (AAA) and surgical repair of large AAAs by ultrasound is recommended for men aged  65-75 years who are current or former smokers.  Stay current with your vaccines (immunizations). Document Released: 04/30/2008 Document Revised: 11/07/2013 Document Reviewed: 03/30/2011 [employer] Patient Information 2015 [employer]. This information is not intended to replace advice given to you by your health care Jacque Garrels. Make sure you discuss any questions you have with your health care Kenyada Dosch.  

## 2014-05-07 ENCOUNTER — Other Ambulatory Visit: Payer: Self-pay | Admitting: Nurse Practitioner

## 2014-05-10 ENCOUNTER — Other Ambulatory Visit: Payer: Self-pay | Admitting: Nurse Practitioner

## 2014-05-10 NOTE — Telephone Encounter (Signed)
Please call in xanax with 1 refills 

## 2014-05-10 NOTE — Telephone Encounter (Signed)
Patient last seen in office on 05-02-14. Rx last filled on 03-29-14 for #[phone]. Please advise. If approved please route to pool B so nurse can phone in to pharmacy

## 2014-05-11 NOTE — Telephone Encounter (Signed)
rx called into pharmacy

## 2014-05-17 LAB — NMR, LIPOPROFILE
Cholesterol: 157 mg/dL (ref 100–199)
HDL Cholesterol by NMR: 47 mg/dL (ref >39)
HDL Particle Number: 37 umol/L (ref >=30.5)
LDL Particle Number: 1268 nmol/L — ABNORMAL HIGH (ref <1000)
LDL Size: 20 nm (ref >20.5)
LDLC SERPL CALC-MCNC: 77 mg/dL (ref 0–99)
LP-IR Score: 65 — ABNORMAL HIGH (ref <=45)
Small LDL Particle Number: 822 nmol/L — ABNORMAL HIGH (ref <=527)
TRIGLYCERIDES BY NMR: 167 mg/dL — AB (ref 0–149)

## 2014-05-17 LAB — BMP8+EGFR
BUN / CREAT RATIO: 18 (ref 10–22)
BUN: 19 mg/dL (ref 8–27)
CO2: 22 mmol/L (ref 18–29)
CREATININE: 1.07 mg/dL (ref 0.76–1.27)
Calcium: 9.2 mg/dL (ref 8.6–10.2)
Chloride: 102 mmol/L (ref 97–108)
GFR calc Af Amer: 79 mL/min/{1.73_m2} (ref >59)
GFR calc non Af Amer: 68 mL/min/{1.73_m2} (ref >59)
Glucose: 154 mg/dL — ABNORMAL HIGH (ref 65–99)
Potassium: 4.4 mmol/L (ref 3.5–5.2)
SODIUM: 146 mmol/L — AB (ref 134–144)

## 2014-05-17 LAB — SPECIMEN STATUS REPORT

## 2014-05-21 ENCOUNTER — Telehealth: Payer: Self-pay | Admitting: *Deleted

## 2014-05-21 NOTE — Telephone Encounter (Signed)
Message copied by Shelbie Ammons on Mon May 21, 2014  3:37 PM ------      Message from: [name]      Created: Mon May 21, 2014  7:34 AM       Ldl particle numbers are a little worse but LDL are good      Blood sugar elevated- please add Hgba1c      Kidney and liver function stable      Continue current meds- low fat diet and exercise and recheck in 3 months                   ------

## 2014-05-21 NOTE — Telephone Encounter (Signed)
Aware of results. 

## 2014-07-02 ENCOUNTER — Encounter: Payer: Self-pay | Admitting: Internal Medicine

## 2014-07-16 ENCOUNTER — Other Ambulatory Visit: Payer: Self-pay | Admitting: Nurse Practitioner

## 2014-07-24 ENCOUNTER — Other Ambulatory Visit: Payer: Self-pay | Admitting: Nurse Practitioner

## 2014-07-25 NOTE — Telephone Encounter (Signed)
rx called in

## 2014-07-25 NOTE — Telephone Encounter (Signed)
Last ov 05/02/14. Last refill 06/21/14. If approved call to [facility].

## 2014-07-25 NOTE — Telephone Encounter (Signed)
Please call in xanax with 1 refills 

## 2014-08-10 ENCOUNTER — Other Ambulatory Visit: Payer: Self-pay | Admitting: Nurse Practitioner

## 2014-08-13 ENCOUNTER — Other Ambulatory Visit: Payer: Self-pay | Admitting: *Deleted

## 2014-08-13 MED ORDER — ESCITALOPRAM OXALATE 10 MG PO TABS: ORAL_TABLET | ORAL | Status: DC

## 2014-08-23 ENCOUNTER — Telehealth: Payer: Self-pay | Admitting: Nurse Practitioner

## 2014-08-23 NOTE — Telephone Encounter (Signed)
Aware. 

## 2014-08-23 NOTE — Telephone Encounter (Signed)
mucinex OTC - force fluids

## 2014-08-23 NOTE — Telephone Encounter (Signed)
Patient has already left

## 2014-08-27 ENCOUNTER — Telehealth: Payer: Self-pay | Admitting: Family Medicine

## 2014-08-27 ENCOUNTER — Ambulatory Visit (INDEPENDENT_AMBULATORY_CARE_PROVIDER_SITE_OTHER): Payer: Medicare Other | Admitting: Family

## 2014-08-27 ENCOUNTER — Encounter: Payer: Self-pay | Admitting: Family

## 2014-08-27 VITALS — BP 155/78 | HR 71 | Temp 98.1°F | Ht 69.0 in | Wt 205.4 lb

## 2014-08-27 DIAGNOSIS — J069 Acute upper respiratory infection, unspecified: Secondary | ICD-10-CM

## 2014-08-27 MED ORDER — AZITHROMYCIN 250 MG PO TABS: ORAL_TABLET | ORAL | Status: DC

## 2014-08-27 NOTE — Patient Instructions (Signed)
Upper Respiratory Infection, Adult An upper respiratory infection (URI) is also known as the common cold. It is often caused by a type of germ (virus). Colds are easily spread (contagious). You can pass it to others by kissing, coughing, sneezing, or drinking out of the same glass. Usually, you get better in 1 or 2 weeks.  HOME CARE   Only take medicine as told by your doctor.  Use a warm mist humidifier or breathe in steam from a hot shower.  Drink enough water and fluids to keep your pee (urine) clear or pale yellow.  Get plenty of rest.  Return to work when your temperature is back to normal or as told by your doctor. You may use a face mask and wash your hands to stop your cold from spreading. GET HELP RIGHT AWAY IF:   After the first few days, you feel you are getting worse.  You have questions about your medicine.  You have chills, shortness of breath, or brown or red spit (mucus).  You have yellow or brown snot (nasal discharge) or pain in the face, especially when you bend forward.  You have a fever, puffy (swollen) neck, pain when you swallow, or white spots in the back of your throat.  You have a bad headache, ear pain, sinus pain, or chest pain.  You have a high-pitched whistling sound when you breathe in and out (wheezing).  You have a lasting cough or cough up blood.  You have sore muscles or a stiff neck. MAKE SURE YOU:   Understand these instructions.  Will watch your condition.  Will get help right away if you are not doing well or get worse. Document Released: 04/20/2008 Document Revised: 01/25/2012 Document Reviewed: 02/07/2014 [facility] Patient Information 2015 [place], [place]. This information is not intended to replace advice given to you by your health care provider. Make sure you discuss any questions you have with your health care provider.  - Take meds as prescribed - Use a cool mist humidifier  -Use saline nose sprays frequently -Saline  irrigations of the nose can be very helpful if done frequently.  * 4X daily for 1 week*  * Use of a nettie pot can be helpful with this. Follow directions with this* -Force fluids -For any cough or congestion  Use plain Mucinex- regular strength or max strength is fine   * Children- consult with Pharmacist for dosing -For fever or aces or pains- take tylenol or ibuprofen appropriate for age and weight.  * for fevers greater than 101 orally you may alternate ibuprofen and tylenol every  3 hours. -Throat lozenges if help    Evelina Dun, FNP

## 2014-08-27 NOTE — Telephone Encounter (Signed)
Appt given for today 

## 2014-08-27 NOTE — Progress Notes (Signed)
   Subjective:    Patient ID: Francisco [name], male    DOB: [birth year], 73 y.o.   MRN: [id number]  Cough This is a new problem. The current episode started 1 to 4 weeks ago (2 weeks ago). The problem has been gradually worsening. The problem occurs every few minutes. The cough is productive of sputum. Associated symptoms include chills, headaches, nasal congestion, postnasal drip, rhinorrhea, a sore throat and shortness of breath. Pertinent negatives include no ear congestion, ear pain, fever, hemoptysis or wheezing. The symptoms are aggravated by lying down. He has tried rest (Mucinex) for the symptoms. The treatment provided mild relief.      Review of Systems  Constitutional: Positive for chills. Negative for fever.  HENT: Positive for postnasal drip, rhinorrhea and sore throat. Negative for ear pain.   Respiratory: Positive for cough and shortness of breath. Negative for hemoptysis and wheezing.   Cardiovascular: Negative.   Gastrointestinal: Negative.   Endocrine: Negative.   Genitourinary: Negative.   Musculoskeletal: Negative.   Neurological: Positive for headaches.  Hematological: Negative.   Psychiatric/Behavioral: Negative.   All other systems reviewed and are negative.      Objective:   Physical Exam  Vitals reviewed. Constitutional: He is oriented to person, place, and time. He appears well-developed and well-nourished. No distress.  HENT:  Head: Normocephalic.  Right Ear: External ear normal.  Left Ear: External ear normal.  Nasal passage erythemas with mild swelling Oropharynx erythemas  Eyes: Pupils are equal, round, and reactive to light. Right eye exhibits no discharge. Left eye exhibits no discharge.  Neck: Normal range of motion. Neck supple. No thyromegaly present.  Cardiovascular: Normal rate, regular rhythm, normal heart sounds and intact distal pulses.   No murmur heard. Pulmonary/Chest: Effort normal and breath sounds normal. No respiratory distress. He has  no wheezes.  Abdominal: Soft. Bowel sounds are normal. He exhibits no distension. There is no tenderness.  Musculoskeletal: Normal range of motion. He exhibits no edema and no tenderness.  Neurological: He is alert and oriented to person, place, and time. He has normal reflexes. No cranial nerve deficit.  Skin: Skin is warm and dry. No rash noted. No erythema.  Psychiatric: He has a normal mood and affect. His behavior is normal. Judgment and thought content normal.      BP 155/78  Pulse 71  Temp(Src) 98.1 F (36.7 C) (Oral)  Ht 5\' 9"  (1.753 m)  Wt 205 lb 6.4 oz (93.169 kg)  BMI 30.32 kg/m2     Assessment & Plan:  1. URI (upper respiratory infection) -- Take meds as prescribed - Use a cool mist humidifier  -Use saline nose sprays frequently -Saline irrigations of the nose can be very helpful if done frequently.  * 4X daily for 1 week*  * Use of a nettie pot can be helpful with this. Follow directions with this* -Force fluids -For any cough or congestion  Use plain Mucinex- regular strength or max strength is fine   * Children- consult with Pharmacist for dosing -For fever or aces or pains- take tylenol or ibuprofen appropriate for age and weight.  * for fevers greater than 101 orally you may alternate ibuprofen and tylenol every  3 hours. -Throat lozenges if help - azithromycin (ZITHROMAX) 250 MG tablet; Take 500 mg today then 250 mg for 4 days  Dispense: 6 tablet; Refill: 0  Evelina Dun, FNP

## 2014-09-03 ENCOUNTER — Ambulatory Visit (INDEPENDENT_AMBULATORY_CARE_PROVIDER_SITE_OTHER): Payer: Medicare Other | Admitting: Nurse Practitioner

## 2014-09-03 ENCOUNTER — Encounter: Payer: Self-pay | Admitting: Nurse Practitioner

## 2014-09-03 VITALS — BP 118/75 | HR 74 | Temp 97.0°F | Ht 69.0 in | Wt 207.0 lb

## 2014-09-03 DIAGNOSIS — Z125 Encounter for screening for malignant neoplasm of prostate: Secondary | ICD-10-CM

## 2014-09-03 DIAGNOSIS — K219 Gastro-esophageal reflux disease without esophagitis: Secondary | ICD-10-CM

## 2014-09-03 DIAGNOSIS — E785 Hyperlipidemia, unspecified: Secondary | ICD-10-CM

## 2014-09-03 DIAGNOSIS — IMO0001 Reserved for inherently not codable concepts without codable children: Secondary | ICD-10-CM

## 2014-09-03 DIAGNOSIS — J449 Chronic obstructive pulmonary disease, unspecified: Secondary | ICD-10-CM

## 2014-09-03 DIAGNOSIS — I10 Essential (primary) hypertension: Secondary | ICD-10-CM

## 2014-09-03 DIAGNOSIS — F411 Generalized anxiety disorder: Secondary | ICD-10-CM

## 2014-09-03 DIAGNOSIS — Z23 Encounter for immunization: Secondary | ICD-10-CM

## 2014-09-03 MED ORDER — ESCITALOPRAM OXALATE 10 MG PO TABS: ORAL_TABLET | ORAL | Status: DC

## 2014-09-03 MED ORDER — ALPRAZOLAM 1 MG PO TABS: ORAL_TABLET | ORAL | Status: DC

## 2014-09-03 MED ORDER — LISINOPRIL 10 MG PO TABS: 10.0000 mg | ORAL_TABLET | Freq: Every day | ORAL | Status: DC

## 2014-09-03 MED ORDER — BUDESONIDE-FORMOTEROL FUMARATE 160-4.5 MCG/ACT IN AERO: 2.0000 | INHALATION_SPRAY | Freq: Two times a day (BID) | RESPIRATORY_TRACT | Status: DC

## 2014-09-03 MED ORDER — SIMVASTATIN 40 MG PO TABS: ORAL_TABLET | ORAL | Status: DC

## 2014-09-03 NOTE — Progress Notes (Signed)
Subjective:     Patient ID: Francisco [name], male   DOB: 11-17-1940, 73 y.o.   MRN: [id number]  Patient here today for chronic follow up. No complaints today.  Hypertension This is a chronic problem. The current episode started more than 1 year ago. The problem is unchanged. The problem is controlled. Pertinent negatives include no blurred vision, chest pain, headaches, neck pain, peripheral edema or shortness of breath. There are no associated agents to hypertension. Risk factors for coronary artery disease include dyslipidemia, male gender and obesity. Past treatments include ACE inhibitors. The current treatment provides significant improvement. There are no compliance problems.   Asthma There is no shortness of breath. This is a chronic problem. The current episode started more than 1 year ago. The problem occurs rarely. The problem has been unchanged. Pertinent negatives include no chest pain, headaches or sore throat. He reports significant improvement on treatment. His past medical history is significant for asthma.  Gastrophageal Reflux He reports no chest pain, no nausea or no sore throat. This is a chronic problem. The problem occurs rarely.  Hyperlipidemia This is a chronic problem. The current episode started more than 1 year ago. The problem is uncontrolled. Recent lipid tests were reviewed and are normal. He has no history of diabetes, hypothyroidism or obesity. Pertinent negatives include no chest pain or shortness of breath. Current antihyperlipidemic treatment includes statins and ezetimibe. The current treatment provides significant improvement of lipids. Compliance problems include adherence to diet.  Risk factors for coronary artery disease include hypertension and male sex.  GAD Patient has been anxious and was seen several months ago but seems to be doing better on lexapro. Only needs xanax on a prn basis.   Review of Systems  HENT: Negative for sore throat.   Eyes: Negative for  blurred vision.  Respiratory: Negative for shortness of breath.   Cardiovascular: Negative for chest pain.  Gastrointestinal: Negative for nausea.  Musculoskeletal: Negative for neck pain.  Neurological: Negative for headaches.  All other systems reviewed and are negative.      Objective:   Physical Exam  Constitutional: He is oriented to person, place, and time. He appears well-developed and well-nourished.  HENT:  Head: Normocephalic.  Right Ear: External ear normal.  Left Ear: External ear normal.  Nose: Nose normal.  Mouth/Throat: Oropharynx is clear and moist.  Eyes: EOM are normal. Pupils are equal, round, and reactive to light.  Neck: Normal range of motion. Neck supple. No thyromegaly present.  Cardiovascular: Normal rate, regular rhythm, normal heart sounds and intact distal pulses.   No murmur heard. Pulmonary/Chest: Effort normal and breath sounds normal. He has no wheezes. He has no rales.  Abdominal: Soft. Bowel sounds are normal.  Musculoskeletal: Normal range of motion.  Neurological: He is alert and oriented to person, place, and time.  Skin: Skin is warm and dry.  Psychiatric: He has a normal mood and affect. His behavior is normal. Judgment and thought content normal.   BP 118/75  Pulse 74  Temp(Src) 97 F (36.1 C) (Oral)  Ht _0  (1.753 m)  Wt 207 lb (93.895 kg)  BMI 30.55 kg/m2          Assessment:   1. Essential hypertension **2Low NA+ diet* - CMP14+EGFR - lisinopril (PRINIVIL,ZESTRIL) 10 MG tablet; Take 1 tablet (10 mg total) by mouth daily.  Dispense: 90 tablet; Refill: 1  2. Hyperlipidemia Low fat diet - NMR, lipoprofile - simvastatin (ZOCOR) 40 MG tablet; TAKE ONE TABLET BY  MOUTH AT BEDTIME  Dispense: 90 tablet; Refill: 1  3. Gastroesophageal reflux disease without esophagitis Avoid caffeine  4. GAD (generalized anxiety disorder) Stress management - escitalopram (LEXAPRO) 10 MG tablet; TAKE TWO TABLETS BY MOUTH ONCE DAILY   Dispense: 180 tablet; Refill: 1 - ALPRAZolam (XANAX) 1 MG tablet; TAKE ONE TABLET BY MOUTH 4 TIMES DAILY  Dispense: 100 tablet; Refill: 1  5. Prostate cancer screening - PSA, total and free  6. COPD bronchitis - budesonide-formoterol (SYMBICORT) 160-4.5 MCG/ACT inhaler; Inhale 2 puffs into the lungs 2 (two) times daily.  Dispense: 3 Inhaler; Refill: 1   Flu shot today Labs pending Health maintenance reviewed Diet and exercise encouraged Continue all meds Follow up  In 3 months   [place], FNP

## 2014-09-03 NOTE — Patient Instructions (Signed)

## 2014-10-12 ENCOUNTER — Other Ambulatory Visit: Payer: Self-pay | Admitting: Nurse Practitioner

## 2014-10-12 NOTE — Telephone Encounter (Signed)
Please call in xanax with 1p refills

## 2014-10-12 NOTE — Telephone Encounter (Signed)
Called to [facility] 

## 2014-10-12 NOTE — Telephone Encounter (Signed)
Last filled 09/11/14, last seen 09/03/14. Call into [facility]

## 2014-10-18 ENCOUNTER — Telehealth: Payer: Self-pay | Admitting: Family Medicine

## 2014-10-19 ENCOUNTER — Other Ambulatory Visit: Payer: Self-pay | Admitting: *Deleted

## 2014-10-19 DIAGNOSIS — I10 Essential (primary) hypertension: Secondary | ICD-10-CM

## 2014-10-19 DIAGNOSIS — F411 Generalized anxiety disorder: Secondary | ICD-10-CM

## 2014-10-19 DIAGNOSIS — IMO0001 Reserved for inherently not codable concepts without codable children: Secondary | ICD-10-CM

## 2014-10-19 DIAGNOSIS — E785 Hyperlipidemia, unspecified: Secondary | ICD-10-CM

## 2014-10-19 MED ORDER — SIMVASTATIN 40 MG PO TABS: ORAL_TABLET | ORAL | Status: DC

## 2014-10-19 MED ORDER — ALPRAZOLAM 1 MG PO TABS
1.0000 mg | ORAL_TABLET | Freq: Four times a day (QID) | ORAL | Status: DC
Start: 2014-10-19 — End: 2015-03-12

## 2014-10-19 MED ORDER — BUDESONIDE-FORMOTEROL FUMARATE 160-4.5 MCG/ACT IN AERO: 2.0000 | INHALATION_SPRAY | Freq: Two times a day (BID) | RESPIRATORY_TRACT | Status: DC

## 2014-10-19 MED ORDER — LISINOPRIL 10 MG PO TABS: 10.0000 mg | ORAL_TABLET | Freq: Every day | ORAL | Status: DC

## 2014-10-19 MED ORDER — ESCITALOPRAM OXALATE 10 MG PO TABS: ORAL_TABLET | ORAL | Status: DC

## 2014-10-19 NOTE — Telephone Encounter (Signed)
Stp and advised Donah Driver was calling him yesterday to make sure he was using mail order pharmacy. Pt verified he is using mail order, will close call.

## 2014-10-19 NOTE — Telephone Encounter (Signed)
Francisco [name] okd this on 10-12-14 but was called in to [facility] and patient wanted mail order. Can you please print and sign and route to Pool A so nurse can call patient to pick up. Please advise

## 2014-10-19 NOTE — Telephone Encounter (Signed)
This is okay to refill and a prescription should be printed.

## 2014-10-22 ENCOUNTER — Telehealth: Payer: Self-pay | Admitting: *Deleted

## 2014-10-22 NOTE — Telephone Encounter (Signed)
Aware ,script ready. 

## 2014-11-24 ENCOUNTER — Ambulatory Visit (INDEPENDENT_AMBULATORY_CARE_PROVIDER_SITE_OTHER): Payer: Medicare Other | Admitting: General Practice

## 2014-11-24 VITALS — BP 129/79 | HR 83 | Temp 97.8°F | Ht 69.0 in | Wt 210.0 lb

## 2014-11-24 DIAGNOSIS — J029 Acute pharyngitis, unspecified: Secondary | ICD-10-CM

## 2014-11-24 DIAGNOSIS — J069 Acute upper respiratory infection, unspecified: Secondary | ICD-10-CM | POA: Diagnosis not present

## 2014-11-24 LAB — POCT RAPID STREP A (OFFICE): RAPID STREP A SCREEN: NEGATIVE

## 2014-11-24 MED ORDER — AZITHROMYCIN 250 MG PO TABS: ORAL_TABLET | ORAL | Status: DC

## 2014-11-24 NOTE — Patient Instructions (Signed)
Upper Respiratory Infection, Adult An upper respiratory infection (URI) is also sometimes known as the common cold. The upper respiratory tract includes the nose, sinuses, throat, trachea, and bronchi. Bronchi are the airways leading to the lungs. Most people improve within 1 week, but symptoms can last up to 2 weeks. A residual cough may last even longer.  CAUSES Many different viruses can infect the tissues lining the upper respiratory tract. The tissues become irritated and inflamed and often become very moist. Mucus production is also common. A cold is contagious. You can easily spread the virus to others by oral contact. This includes kissing, sharing a glass, coughing, or sneezing. Touching your mouth or nose and then touching a surface, which is then touched by another person, can also spread the virus. SYMPTOMS  Symptoms typically develop 1 to 3 days after you come in contact with a cold virus. Symptoms vary from person to person. They may include:  Runny nose.  Sneezing.  Nasal congestion.  Sinus irritation.  Sore throat.  Loss of voice (laryngitis).  Cough.  Fatigue.  Muscle aches.  Loss of appetite.  Headache.  Low-grade fever. DIAGNOSIS  You might diagnose your own cold based on familiar symptoms, since most people get a cold 2 to 3 times a year. Your caregiver can confirm this based on your exam. Most importantly, your caregiver can check that your symptoms are not due to another disease such as strep throat, sinusitis, pneumonia, asthma, or epiglottitis. Blood tests, throat tests, and X-rays are not necessary to diagnose a common cold, but they may sometimes be helpful in excluding other more serious diseases. Your caregiver will decide if any further tests are required. RISKS AND COMPLICATIONS  You may be at risk for a more severe case of the common cold if you smoke cigarettes, have chronic heart disease (such as heart failure) or lung disease (such as asthma), or if  you have a weakened immune system. The very young and very old are also at risk for more serious infections. Bacterial sinusitis, middle ear infections, and bacterial pneumonia can complicate the common cold. The common cold can worsen asthma and chronic obstructive pulmonary disease (COPD). Sometimes, these complications can require emergency medical care and may be life-threatening. PREVENTION  The best way to protect against getting a cold is to practice good hygiene. Avoid oral or hand contact with people with cold symptoms. Wash your hands often if contact occurs. There is no clear evidence that vitamin C, vitamin E, echinacea, or exercise reduces the chance of developing a cold. However, it is always recommended to get plenty of rest and practice good nutrition. TREATMENT  Treatment is directed at relieving symptoms. There is no cure. Antibiotics are not effective, because the infection is caused by a virus, not by bacteria. Treatment may include:  Increased fluid intake. Sports drinks offer valuable electrolytes, sugars, and fluids.  Breathing heated mist or steam (vaporizer or shower).  Eating chicken soup or other clear broths, and maintaining good nutrition.  Getting plenty of rest.  Using gargles or lozenges for comfort.  Controlling fevers with ibuprofen or acetaminophen as directed by your caregiver.  Increasing usage of your inhaler if you have asthma. Zinc gel and zinc lozenges, taken in the first 24 hours of the common cold, can shorten the duration and lessen the severity of symptoms. Pain medicines may help with fever, muscle aches, and throat pain. A variety of non-prescription medicines are available to treat congestion and runny nose. Your caregiver   can make recommendations and may suggest nasal or lung inhalers for other symptoms.  HOME CARE INSTRUCTIONS   Only take over-the-counter or prescription medicines for pain, discomfort, or fever as directed by your  caregiver.  Use a warm mist humidifier or inhale steam from a shower to increase air moisture. This may keep secretions moist and make it easier to breathe.  Drink enough water and fluids to keep your urine clear or pale yellow.  Rest as needed.  Return to work when your temperature has returned to normal or as your caregiver advises. You may need to stay home longer to avoid infecting others. You can also use a face mask and careful hand washing to prevent spread of the virus. SEEK MEDICAL CARE IF:   After the first few days, you feel you are getting worse rather than better.  You need your caregiver's advice about medicines to control symptoms.  You develop chills, worsening shortness of breath, or brown or red sputum. These may be signs of pneumonia.  You develop yellow or brown nasal discharge or pain in the face, especially when you bend forward. These may be signs of sinusitis.  You develop a fever, swollen neck glands, pain with swallowing, or white areas in the back of your throat. These may be signs of strep throat. SEEK IMMEDIATE MEDICAL CARE IF:   You have a fever.  You develop severe or persistent headache, ear pain, sinus pain, or chest pain.  You develop wheezing, a prolonged cough, cough up blood, or have a change in your usual mucus (if you have chronic lung disease).  You develop sore muscles or a stiff neck. Document Released: 04/28/2001 Document Revised: 01/25/2012 Document Reviewed: 02/07/2014 Exit[employer] Patient Information 2015 [employer]. This information is not intended to replace advice given to you by your health care provider. Make sure you discuss any questions you have with your health care provider.  

## 2014-11-24 NOTE — Progress Notes (Signed)
   Subjective:    Patient ID: Francisco [name], male    DOB: May 19, 1941, 74 y.o.   MRN: [id number]  HPI Patient presents with complaints of cough (productive, greenish drainage), sore throat, nasal congestion, slight dizziness when coughing. Onset of 2 weeks ago and gradually worsened. OTC nyquil/dayquil, robitussin without relief.     Review of Systems  Constitutional: Negative for fever and chills.  HENT: Positive for postnasal drip and sore throat. Negative for sinus pressure.   Respiratory: Positive for cough. Negative for chest tightness and shortness of breath.   Cardiovascular: Negative for chest pain and palpitations.  Neurological: Negative for dizziness.       Objective:   Physical Exam  Constitutional: He is oriented to person, place, and time. He appears well-developed and well-nourished.  HENT:  Head: Normocephalic and atraumatic.  Right Ear: External ear normal.  Left Ear: External ear normal.  Cardiovascular: Normal rate, regular rhythm and normal heart sounds.   Pulmonary/Chest: Effort normal and breath sounds normal. No respiratory distress. He exhibits no tenderness.  Neurological: He is alert and oriented to person, place, and time.  Skin: Skin is warm and dry.  Psychiatric: He has a normal mood and affect.     Results for orders placed or performed in visit on 11/24/14  POCT rapid strep A  Result Value Ref Range   Rapid Strep A Screen Negative Negative        Assessment & Plan:  1. Sore throat  - POCT rapid strep A  2. Acute upper respiratory infection  - azithromycin (ZITHROMAX) 250 MG tablet; Take as directed  Dispense: 6 tablet; Refill: 0 Increase fluid intake Throat lozenges if help Proper handwashing Patient verbalized understanding and in agreement Erby Pian, FNP-C

## 2014-12-27 ENCOUNTER — Ambulatory Visit (INDEPENDENT_AMBULATORY_CARE_PROVIDER_SITE_OTHER): Payer: Medicare Other | Admitting: Nurse Practitioner

## 2014-12-27 ENCOUNTER — Encounter: Payer: Self-pay | Admitting: Nurse Practitioner

## 2014-12-27 VITALS — BP 135/73 | HR 74 | Temp 97.2°F | Ht 69.0 in | Wt 208.0 lb

## 2014-12-27 DIAGNOSIS — E785 Hyperlipidemia, unspecified: Secondary | ICD-10-CM

## 2014-12-27 DIAGNOSIS — Z125 Encounter for screening for malignant neoplasm of prostate: Secondary | ICD-10-CM | POA: Diagnosis not present

## 2014-12-27 DIAGNOSIS — I10 Essential (primary) hypertension: Secondary | ICD-10-CM | POA: Diagnosis not present

## 2014-12-27 DIAGNOSIS — R3911 Hesitancy of micturition: Secondary | ICD-10-CM

## 2014-12-27 DIAGNOSIS — K219 Gastro-esophageal reflux disease without esophagitis: Secondary | ICD-10-CM

## 2014-12-27 DIAGNOSIS — F411 Generalized anxiety disorder: Secondary | ICD-10-CM

## 2014-12-27 DIAGNOSIS — N401 Enlarged prostate with lower urinary tract symptoms: Secondary | ICD-10-CM

## 2014-12-27 NOTE — Progress Notes (Signed)
Subjective:     Patient ID: Francisco [name], male   DOB: June 07, 1941, 74 y.o.   MRN: [id number]  Patient here today for chronic follow up. Complaint having trouble getting urinary stream started- has had TURP in the past.  Hypertension This is a chronic problem. The current episode started more than 1 year ago. The problem is unchanged. The problem is controlled. Pertinent negatives include no chest pain, headaches, neck pain or shortness of breath. Risk factors for coronary artery disease include dyslipidemia, obesity and sedentary lifestyle. The current treatment provides moderate improvement. Compliance problems include diet and exercise.  Hypertensive end-organ damage includes CAD/MI.  Asthma There is no shortness of breath. Pertinent negatives include no chest pain, headaches or sore throat. His past medical history is significant for asthma.  Gastrophageal Reflux He reports no chest pain, no nausea or no sore throat.  Hyperlipidemia This is a chronic problem. The current episode started more than 1 year ago. The problem is controlled. Recent lipid tests were reviewed and are normal. Exacerbating diseases include obesity. He has no history of diabetes or hypothyroidism. Pertinent negatives include no chest pain or shortness of breath. Current antihyperlipidemic treatment includes statins. The current treatment provides moderate improvement of lipids. Compliance problems include adherence to diet and adherence to exercise.  Risk factors for coronary artery disease include dyslipidemia, hypertension, male sex and a sedentary lifestyle.  GAD Patient has been anxious and was seen several months ago but seems to be doing better on lexapro. Only needs xanax on a prn basis.   Review of Systems  Constitutional: Negative.   HENT: Negative.  Negative for sore throat.   Respiratory: Negative for shortness of breath.   Cardiovascular: Negative for chest pain.  Gastrointestinal: Negative for nausea.   Genitourinary: Negative.   Musculoskeletal: Negative for neck pain.  Neurological: Negative for headaches.  Psychiatric/Behavioral: Negative.   All other systems reviewed and are negative.      Objective:   Physical Exam  Constitutional: He is oriented to person, place, and time. He appears well-developed and well-nourished.  HENT:  Head: Normocephalic.  Right Ear: External ear normal.  Left Ear: External ear normal.  Nose: Nose normal.  Mouth/Throat: Oropharynx is clear and moist.  Eyes: EOM are normal. Pupils are equal, round, and reactive to light.  Neck: Normal range of motion. Neck supple. No thyromegaly present.  Cardiovascular: Normal rate, regular rhythm, normal heart sounds and intact distal pulses.   No murmur heard. Pulmonary/Chest: Effort normal and breath sounds normal. He has no wheezes. He has no rales.  Abdominal: Soft. Bowel sounds are normal.  Genitourinary:  Prostate check referred to urologist  Musculoskeletal: Normal range of motion.  Neurological: He is alert and oriented to person, place, and time.  Skin: Skin is warm and dry.  Psychiatric: He has a normal mood and affect. His behavior is normal. Judgment and thought content normal.   BP 135/73 mmHg  Pulse 74  Temp(Src) 97.2 F (36.2 C) (Oral)  Ht 5' 9"  (1.753 m)  Wt 208 lb (94.348 kg)  BMI 30.70 kg/m2          Assessment:   1. Essential hypertension *2do not add slat to diet** - CMP14+EGFR  2. Gastroesophageal reflux disease without esophagitis Avoid spicy foods  3. Hyperlipidemia Low fta diet - NMR, lipoprofile  4. GAD (generalized anxiety disorder) Stress managemnet  5. Prostate cancer screening - PSA, total and free - Ambulatory referral to Urology  6. Urinary hesitancy due to benign  prostatic hyperplasia Referral to urology    Labs pending Health maintenance reviewed Diet and exercise encouraged Continue all meds Follow up  In 3 month   [place],  FNP

## 2014-12-27 NOTE — Patient Instructions (Signed)
Benign Prostatic Hyperplasia An enlarged prostate (benign prostatic hyperplasia) is common in older men. You may experience the following:  Weak urine stream.  Dribbling.  Feeling like the bladder has not emptied completely.  Difficulty starting urination.  Getting up frequently at night to urinate.  Urinating more frequently during the day. HOME CARE INSTRUCTIONS  Monitor your prostatic hyperplasia for any changes. The following actions may help to alleviate any discomfort you are experiencing:  Give yourself time when you urinate.  Stay away from alcohol.  Avoid beverages containing caffeine, such as coffee, tea, and colas, because they can make the problem worse.  Avoid decongestants, antihistamines, and some prescription medicines that can make the problem worse.  Follow up with your health care provider for further treatment as recommended. SEEK MEDICAL CARE IF:  You are experiencing progressive difficulty voiding.  Your urine stream is progressively getting narrower.  You are awaking from sleep with the urge to void more frequently.  You are constantly feeling the need to void.  You experience loss of urine, especially in small amounts. SEEK IMMEDIATE MEDICAL CARE IF:   You develop increased pain with urination or are unable to urinate.  You develop severe abdominal pain, vomiting, a high fever, or fainting.  You develop back pain or blood in your urine. MAKE SURE YOU:   Understand these instructions.  Will watch your condition.  Will get help right away if you are not doing well or get worse. Document Released: 11/02/2005 Document Revised: 07/05/2013 Document Reviewed: 04/04/2013 [employer] Patient Information 2015 [employer]. This information is not intended to replace advice given to you by your health care provider. Make sure you discuss any questions you have with your health care provider.  

## 2014-12-28 LAB — CMP14+EGFR
A/G RATIO: 2.3 (ref 1.1–2.5)
ALBUMIN: 4.2 g/dL (ref 3.5–4.8)
ALT: 11 IU/L (ref 0–44)
AST: 14 IU/L (ref 0–40)
Alkaline Phosphatase: 43 IU/L (ref 39–117)
BILIRUBIN TOTAL: 0.4 mg/dL (ref 0.0–1.2)
BUN / CREAT RATIO: 17 (ref 10–22)
BUN: 18 mg/dL (ref 8–27)
CALCIUM: 9 mg/dL (ref 8.6–10.2)
CHLORIDE: 106 mmol/L (ref 97–108)
CO2: 25 mmol/L (ref 18–29)
Creatinine, Ser: 1.04 mg/dL (ref 0.76–1.27)
GFR calc non Af Amer: 71 mL/min/{1.73_m2} (ref >59)
GFR, EST AFRICAN AMERICAN: 82 mL/min/{1.73_m2} (ref >59)
Globulin, Total: 1.8 g/dL (ref 1.5–4.5)
Glucose: 100 mg/dL — ABNORMAL HIGH (ref 65–99)
Potassium: 4.4 mmol/L (ref 3.5–5.2)
SODIUM: 145 mmol/L — AB (ref 134–144)
Total Protein: 6 g/dL (ref 6.0–8.5)

## 2014-12-28 LAB — NMR, LIPOPROFILE
Cholesterol: 159 mg/dL (ref 100–199)
HDL Cholesterol by NMR: 37 mg/dL — ABNORMAL LOW (ref >39)
HDL PARTICLE NUMBER: 33.7 umol/L (ref >=30.5)
LDL Particle Number: 1119 nmol/L — ABNORMAL HIGH (ref <1000)
LDL Size: 19.7 nm (ref >20.5)
LP-IR Score: 79 — ABNORMAL HIGH (ref <=45)
Small LDL Particle Number: 898 nmol/L — ABNORMAL HIGH (ref <=527)
Triglycerides by NMR: 416 mg/dL — ABNORMAL HIGH (ref 0–149)

## 2014-12-28 LAB — PSA, TOTAL AND FREE
PSA, Free Pct: 30 %
PSA, Free: 0.36 ng/mL
PSA: 1.2 ng/mL (ref 0.0–4.0)

## 2014-12-31 ENCOUNTER — Telehealth: Payer: Self-pay | Admitting: *Deleted

## 2014-12-31 NOTE — Telephone Encounter (Signed)
-----   Message from [facility], [place] sent at 12/28/2014  5:01 PM EST ----- Kidney and liver function stable ldl particle numbers have improved but trig are terrible- were good last check- watch carbs and we will recheck in 3 months PSA normal Continue current meds- low fat diet and exercise and recheck in 3 months

## 2014-12-31 NOTE — Telephone Encounter (Signed)
Pt notified of results Verbalizes understanding 

## 2015-01-30 DIAGNOSIS — R3911 Hesitancy of micturition: Secondary | ICD-10-CM | POA: Diagnosis not present

## 2015-01-30 DIAGNOSIS — N138 Other obstructive and reflux uropathy: Secondary | ICD-10-CM | POA: Diagnosis not present

## 2015-01-30 DIAGNOSIS — N401 Enlarged prostate with lower urinary tract symptoms: Secondary | ICD-10-CM | POA: Diagnosis not present

## 2015-03-10 ENCOUNTER — Other Ambulatory Visit: Payer: Self-pay | Admitting: Nurse Practitioner

## 2015-03-12 ENCOUNTER — Other Ambulatory Visit: Payer: Self-pay

## 2015-03-12 MED ORDER — ALPRAZOLAM 1 MG PO TABS: 1.0000 mg | ORAL_TABLET | Freq: Four times a day (QID) | ORAL | Status: DC

## 2015-03-12 NOTE — Telephone Encounter (Signed)
Pt aware written Rx is at front office ready to be picked up

## 2015-03-12 NOTE — Telephone Encounter (Signed)
Please call in xanax with 1 refills 

## 2015-03-12 NOTE — Telephone Encounter (Signed)
Last seen 12/27/14 MMM  If approved print for mail order and have nurse call patient to pick up

## 2015-03-18 ENCOUNTER — Encounter: Payer: Self-pay | Admitting: Physician Assistant

## 2015-03-18 ENCOUNTER — Ambulatory Visit (INDEPENDENT_AMBULATORY_CARE_PROVIDER_SITE_OTHER): Payer: Medicare Other | Admitting: Physician Assistant

## 2015-03-18 VITALS — BP 125/70 | HR 74 | Temp 97.6°F | Ht 69.0 in | Wt 209.0 lb

## 2015-03-18 DIAGNOSIS — J020 Streptococcal pharyngitis: Secondary | ICD-10-CM | POA: Diagnosis not present

## 2015-03-18 DIAGNOSIS — J101 Influenza due to other identified influenza virus with other respiratory manifestations: Secondary | ICD-10-CM | POA: Diagnosis not present

## 2015-03-18 DIAGNOSIS — R05 Cough: Secondary | ICD-10-CM | POA: Diagnosis not present

## 2015-03-18 DIAGNOSIS — J069 Acute upper respiratory infection, unspecified: Secondary | ICD-10-CM | POA: Diagnosis not present

## 2015-03-18 DIAGNOSIS — R059 Cough, unspecified: Secondary | ICD-10-CM

## 2015-03-18 LAB — POCT INFLUENZA A/B
Influenza A, POC: NEGATIVE
Influenza B, POC: POSITIVE

## 2015-03-18 LAB — POCT RAPID STREP A (OFFICE): RAPID STREP A SCREEN: NEGATIVE

## 2015-03-18 MED ORDER — AZITHROMYCIN 250 MG PO TABS: ORAL_TABLET | ORAL | Status: DC

## 2015-03-18 MED ORDER — ALBUTEROL SULFATE HFA 108 (90 BASE) MCG/ACT IN AERS: 2.0000 | INHALATION_SPRAY | Freq: Four times a day (QID) | RESPIRATORY_TRACT | Status: DC | PRN

## 2015-03-18 MED ORDER — BENZONATATE 100 MG PO CAPS: 100.0000 mg | ORAL_CAPSULE | Freq: Three times a day (TID) | ORAL | Status: DC | PRN

## 2015-03-18 NOTE — Progress Notes (Signed)
   Subjective:    Patient ID: Francisco [name], male    DOB: [birth year], 74 y.o.   MRN: [id number]  HPI 74 y/o male presents with c/o irritated throat, nasal congestion x 1+ week. Has tried theraflu, dayquil, robitussin with no relief. Stopped Symbicort because of side pain side effect.     Review of Systems  Unable to perform ROS Constitutional: Positive for chills and diaphoresis.  HENT: Positive for congestion (nasal and chest ), postnasal drip, sinus pressure, sneezing and sore throat.   Respiratory: Positive for cough (intermittently productive) and wheezing.   Cardiovascular: Negative.   Gastrointestinal: Negative.   All other systems reviewed and are negative.      Objective:   Physical Exam  Constitutional: He is oriented to person, place, and time. He appears well-developed and well-nourished. No distress.  HENT:  Head: Normocephalic and atraumatic.  Mouth/Throat: No oropharyngeal exudate.  Posterior pharynx injection bilaterally   Cardiovascular: Normal rate, regular rhythm and normal heart sounds.  Exam reveals no gallop and no friction rub.   No murmur heard. Pulmonary/Chest: Effort normal. He has wheezes (inspiratory on left, expiratory on right ). He has no rales. He exhibits no tenderness.  Neurological: He is alert and oriented to person, place, and time.  Skin: He is not diaphoretic.  Psychiatric: He has a normal mood and affect. His behavior is normal.  Nursing note and vitals reviewed.         Assessment & Plan:  1. Streptococcal sore throat  - POCT rapid strep A - POCT Influenza A/B - Culture, Group A Strep  2. Influenza B Patient is not a candidate for tamiflu d/t length of illness  3. Acute upper respiratory infection  - azithromycin (ZITHROMAX) 250 MG tablet; Take 2 tablets on day 1, then 1 tablet on 2-5  Dispense: 6 tablet; Refill: 0 - albuterol (PROVENTIL HFA;VENTOLIN HFA) 108 (90 BASE) MCG/ACT inhaler; Inhale 2 puffs into the lungs every 6  (six) hours as needed for wheezing or shortness of breath.  Dispense: 1 Inhaler; Refill: 0  4. Cough  - benzonatate (TESSALON) 100 MG capsule; Take 1 capsule (100 mg total) by mouth 3 (three) times daily as needed for cough.  Dispense: 20 capsule; Refill: 0     Kalyani Maeda A. Benjamin Stain PA-C

## 2015-03-18 NOTE — Patient Instructions (Signed)

## 2015-03-20 LAB — CULTURE, GROUP A STREP: Strep A Culture: NEGATIVE

## 2015-03-25 ENCOUNTER — Encounter: Payer: Self-pay | Admitting: Family

## 2015-03-25 ENCOUNTER — Ambulatory Visit (INDEPENDENT_AMBULATORY_CARE_PROVIDER_SITE_OTHER): Payer: Medicare Other | Admitting: Family

## 2015-03-25 VITALS — BP 144/78 | HR 71 | Temp 97.6°F | Ht 69.0 in | Wt 209.0 lb

## 2015-03-25 DIAGNOSIS — R05 Cough: Secondary | ICD-10-CM | POA: Diagnosis not present

## 2015-03-25 DIAGNOSIS — R059 Cough, unspecified: Secondary | ICD-10-CM

## 2015-03-25 DIAGNOSIS — J209 Acute bronchitis, unspecified: Secondary | ICD-10-CM

## 2015-03-25 MED ORDER — HYDROCODONE-HOMATROPINE 5-1.5 MG/5ML PO SYRP: 5.0000 mL | ORAL_SOLUTION | Freq: Three times a day (TID) | ORAL | Status: DC | PRN

## 2015-03-25 MED ORDER — METHYLPREDNISOLONE ACETATE 80 MG/ML IJ SUSP
80.0000 mg | Freq: Once | INTRAMUSCULAR | Status: AC
  Administered 2015-03-25: 80 mg via INTRAMUSCULAR

## 2015-03-25 MED ORDER — BENZONATATE 100 MG PO CAPS: 100.0000 mg | ORAL_CAPSULE | Freq: Three times a day (TID) | ORAL | Status: DC | PRN

## 2015-03-25 MED ORDER — METHYLPREDNISOLONE 4 MG PO TBPK: ORAL_TABLET | ORAL | Status: DC

## 2015-03-25 MED ORDER — LEVOFLOXACIN 500 MG PO TABS: 500.0000 mg | ORAL_TABLET | Freq: Every day | ORAL | Status: DC

## 2015-03-25 NOTE — Patient Instructions (Signed)

## 2015-03-25 NOTE — Progress Notes (Signed)
Subjective:    Patient ID: Francisco [name], male    DOB: 01-20-1941, 74 y.o.   MRN: [id number]  Cough This is a recurrent problem. The current episode started 1 to 4 weeks ago. The problem has been waxing and waning. The cough is productive of purulent sputum. Associated symptoms include chills, a fever, headaches, myalgias, nasal congestion, postnasal drip, rhinorrhea, a sore throat, shortness of breath and wheezing. Pertinent negatives include no ear congestion or ear pain. The symptoms are aggravated by lying down. He has tried rest for the symptoms. The treatment provided moderate relief. His past medical history is significant for asthma. There is no history of COPD.      Review of Systems  Constitutional: Positive for fever and chills.  HENT: Positive for postnasal drip, rhinorrhea and sore throat. Negative for ear pain.   Respiratory: Positive for cough, shortness of breath and wheezing.   Cardiovascular: Negative.   Gastrointestinal: Negative.   Endocrine: Negative.   Genitourinary: Negative.   Musculoskeletal: Positive for myalgias.  Neurological: Positive for headaches.  Hematological: Negative.   Psychiatric/Behavioral: Negative.   All other systems reviewed and are negative.      Objective:   Physical Exam  Constitutional: He is oriented to person, place, and time. He appears well-developed and well-nourished. No distress.  HENT:  Head: Normocephalic.  Right Ear: External ear normal.  Left Ear: External ear normal.  Nasal passage erythemas with mild swelling  Oropharynx erythemas   Eyes: Pupils are equal, round, and reactive to light. Right eye exhibits no discharge. Left eye exhibits no discharge.  Neck: Normal range of motion. Neck supple. No thyromegaly present.  Cardiovascular: Normal rate, regular rhythm, normal heart sounds and intact distal pulses.   No murmur heard. Pulmonary/Chest: Effort normal and breath sounds normal. No respiratory distress. He has  no wheezes.  Abdominal: Soft. Bowel sounds are normal. He exhibits no distension. There is no tenderness.  Musculoskeletal: Normal range of motion. He exhibits no edema or tenderness.  Neurological: He is alert and oriented to person, place, and time. He has normal reflexes. No cranial nerve deficit.  Skin: Skin is warm and dry. No rash noted. No erythema.  Psychiatric: He has a normal mood and affect. His behavior is normal. Judgment and thought content normal.  Vitals reviewed.     BP 144/78 mmHg  Pulse 71  Temp(Src) 97.6 F (36.4 C) (Oral)  Ht 5\' 9"  (1.753 m)  Wt 209 lb (94.802 kg)  BMI 30.85 kg/m2     Assessment & Plan:  1. Acute bronchitis, unspecified organism -- Take meds as prescribed - Use a cool mist humidifier  -Use saline nose sprays frequently -Saline irrigations of the nose can be very helpful if done frequently.  * 4X daily for 1 week*  * Use of a nettie pot can be helpful with this. Follow directions with this* -Force fluids -For any cough or congestion  Use plain Mucinex- regular strength or max strength is fine   * Children- consult with Pharmacist for dosing -For fever or aces or pains- take tylenol or ibuprofen appropriate for age and weight.  * for fevers greater than 101 orally you may alternate ibuprofen and tylenol every  3 hours. -Throat lozenges if help - levofloxacin (LEVAQUIN) 500 MG tablet; Take 1 tablet (500 mg total) by mouth daily.  Dispense: 7 tablet; Refill: 0 - HYDROcodone-homatropine (HYCODAN) 5-1.5 MG/5ML syrup; Take 5 mLs by mouth every 8 (eight) hours as needed for cough.  Dispense: 120 mL; Refill: 0 - methylPREDNISolone (MEDROL DOSEPAK) 4 MG TBPK tablet; Use as directed  Dispense: 21 tablet; Refill: 0 - methylPREDNISolone acetate (DEPO-MEDROL) injection 80 mg; Inject 1 mL (80 mg total) into the muscle once. - benzonatate (TESSALON) 100 MG capsule; Take 1 capsule (100 mg total) by mouth 3 (three) times daily as needed for cough.  Dispense:  20 capsule; Refill: 0  2. Cough - benzonatate (TESSALON) 100 MG capsule; Take 1 capsule (100 mg total) by mouth 3 (three) times daily as needed for cough.  Dispense: 20 capsule; Refill: 0  Evelina Dun, FNP

## 2015-04-04 ENCOUNTER — Ambulatory Visit (INDEPENDENT_AMBULATORY_CARE_PROVIDER_SITE_OTHER): Payer: Medicare Other | Admitting: Nurse Practitioner

## 2015-04-04 ENCOUNTER — Encounter: Payer: Self-pay | Admitting: Nurse Practitioner

## 2015-04-04 VITALS — BP 127/66 | HR 70 | Temp 97.0°F | Ht 69.0 in | Wt 209.0 lb

## 2015-04-04 DIAGNOSIS — K219 Gastro-esophageal reflux disease without esophagitis: Secondary | ICD-10-CM

## 2015-04-04 DIAGNOSIS — E785 Hyperlipidemia, unspecified: Secondary | ICD-10-CM

## 2015-04-04 DIAGNOSIS — J45909 Unspecified asthma, uncomplicated: Secondary | ICD-10-CM | POA: Insufficient documentation

## 2015-04-04 DIAGNOSIS — I10 Essential (primary) hypertension: Secondary | ICD-10-CM | POA: Diagnosis not present

## 2015-04-04 DIAGNOSIS — R3911 Hesitancy of micturition: Secondary | ICD-10-CM | POA: Insufficient documentation

## 2015-04-04 DIAGNOSIS — R7309 Other abnormal glucose: Secondary | ICD-10-CM | POA: Diagnosis not present

## 2015-04-04 DIAGNOSIS — J4520 Mild intermittent asthma, uncomplicated: Secondary | ICD-10-CM | POA: Diagnosis not present

## 2015-04-04 DIAGNOSIS — F411 Generalized anxiety disorder: Secondary | ICD-10-CM

## 2015-04-04 MED ORDER — LISINOPRIL 10 MG PO TABS: 10.0000 mg | ORAL_TABLET | Freq: Every day | ORAL | Status: DC

## 2015-04-04 MED ORDER — TAMSULOSIN HCL 0.4 MG PO CAPS: 0.4000 mg | ORAL_CAPSULE | Freq: Every day | ORAL | Status: DC

## 2015-04-04 MED ORDER — SIMVASTATIN 40 MG PO TABS: ORAL_TABLET | ORAL | Status: DC

## 2015-04-04 MED ORDER — ESCITALOPRAM OXALATE 10 MG PO TABS: ORAL_TABLET | ORAL | Status: DC

## 2015-04-04 MED ORDER — ALPRAZOLAM 1 MG PO TABS: 1.0000 mg | ORAL_TABLET | Freq: Four times a day (QID) | ORAL | Status: DC

## 2015-04-04 NOTE — Progress Notes (Signed)
Subjective:     Patient ID: Francisco [name], male   DOB: [birth year], 74 y.o.   MRN: [id number]  Patient here today for chronic follow up. Patient still c/o pain that was under right rib cage and then moved over to left side- he saw a commercial on tv about symbicort side effects- so patient stopped taking and side has completetly quit hurting. Hypertension This is a chronic problem. The current episode started more than 1 year ago. The problem is unchanged. The problem is controlled. Pertinent negatives include no chest pain, headaches, neck pain or shortness of breath. Risk factors for coronary artery disease include dyslipidemia, obesity and sedentary lifestyle. The current treatment provides moderate improvement. Compliance problems include diet and exercise.  Hypertensive end-organ damage includes CAD/MI.  Asthma There is no shortness of breath. Pertinent negatives include no chest pain, headaches or sore throat. His past medical history is significant for asthma.  Gastrophageal Reflux He reports no chest pain, no nausea or no sore throat.  Hyperlipidemia This is a chronic problem. The current episode started more than 1 year ago. The problem is controlled. Recent lipid tests were reviewed and are normal. Exacerbating diseases include obesity. He has no history of diabetes or hypothyroidism. Pertinent negatives include no chest pain or shortness of breath. Current antihyperlipidemic treatment includes statins. The current treatment provides moderate improvement of lipids. Compliance problems include adherence to diet and adherence to exercise.  Risk factors for coronary artery disease include dyslipidemia, hypertension, male sex and a sedentary lifestyle.  GAD Patient has been anxious and was seen several months ago but seems to be doing better on lexapro. Only needs xanax on a prn basis.   Review of Systems  Constitutional: Negative.   HENT: Negative.  Negative for sore throat.   Respiratory:  Negative for shortness of breath.   Cardiovascular: Negative for chest pain.  Gastrointestinal: Negative for nausea.  Genitourinary: Negative.   Musculoskeletal: Negative for neck pain.  Neurological: Negative for headaches.  Psychiatric/Behavioral: Negative.   All other systems reviewed and are negative.      Objective:   Physical Exam  Constitutional: He is oriented to person, place, and time. He appears well-developed and well-nourished.  HENT:  Head: Normocephalic.  Right Ear: External ear normal.  Left Ear: External ear normal.  Nose: Nose normal.  Mouth/Throat: Oropharynx is clear and moist.  Eyes: EOM are normal. Pupils are equal, round, and reactive to light.  Neck: Normal range of motion. Neck supple. No thyromegaly present.  Cardiovascular: Normal rate, regular rhythm, normal heart sounds and intact distal pulses.   No murmur heard. Pulmonary/Chest: Effort normal and breath sounds normal. He has no wheezes. He has no rales.  Abdominal: Soft. Bowel sounds are normal.  Genitourinary:  Prostate check referred to urologist  Musculoskeletal: Normal range of motion.  Neurological: He is alert and oriented to person, place, and time.  Skin: Skin is warm and dry.  Psychiatric: He has a normal mood and affect. His behavior is normal. Judgment and thought content normal.    BP 127/66 mmHg  Pulse 70  Temp(Src) 97 F (36.1 C) (Oral)  Ht 5' 9"  (1.753 m)  Wt 209 lb (94.802 kg)  BMI 30.85 kg/m2          Assessment:   1. Essential hypertension Do not add salt diet - lisinopril (PRINIVIL,ZESTRIL) 10 MG tablet; Take 1 tablet (10 mg total) by mouth daily.  Dispense: 90 tablet; Refill: 1 - CMP14+EGFR  2. Gastroesophageal reflux  disease without esophagitis Avoid spicy foods Do not eat 2 hours prior to bedtime   3. Hyperlipidemia Low fat diet - simvastatin (ZOCOR) 40 MG tablet; TAKE ONE TABLET BY MOUTH AT BEDTIME  Dispense: 90 tablet; Refill: 1 - NMR,  lipoprofile  4. GAD (generalized anxiety disorder) Stress management - ALPRAZolam (XANAX) 1 MG tablet; Take 1 tablet (1 mg total) by mouth 4 (four) times daily.  Dispense: 360 tablet; Refill: 0 - escitalopram (LEXAPRO) 10 MG tablet; Take 2 tablets by mouth  once daily  Dispense: 180 tablet; Refill: 1  5. Asthma, chronic, mild intermittent, uncomplicated Let me know if starts wheezing since no longeron symicort 6. Urinary hesitancy    Labs pending Health maintenance reviewed Diet and exercise encouraged Continue all meds Follow up  In 3 months   [place], FNP

## 2015-04-04 NOTE — Patient Instructions (Signed)
Fat and Cholesterol Control Diet Fat and cholesterol levels in your blood and organs are influenced by your diet. High levels of fat and cholesterol may lead to diseases of the heart, small and large blood vessels, gallbladder, liver, and pancreas. CONTROLLING FAT AND CHOLESTEROL WITH DIET Although exercise and lifestyle factors are important, your diet is key. That is because certain foods are known to raise cholesterol and others to lower it. The goal is to balance foods for their effect on cholesterol and more importantly, to replace saturated and trans fat with other types of fat, such as monounsaturated fat, polyunsaturated fat, and omega-3 fatty acids. On average, a person should consume no more than 15 to 17 g of saturated fat daily. Saturated and trans fats are considered "bad" fats, and they will raise LDL cholesterol. Saturated fats are primarily found in animal products such as meats, butter, and cream. However, that does not mean you need to give up all your favorite foods. Today, there are good tasting, low-fat, low-cholesterol substitutes for most of the things you like to eat. Choose low-fat or nonfat alternatives. Choose round or loin cuts of red meat. These types of cuts are lowest in fat and cholesterol. Chicken (without the skin), fish, veal, and ground turkey breast are great choices. Eliminate fatty meats, such as hot dogs and salami. Even shellfish have little or no saturated fat. Have a 3 oz (85 g) portion when you eat lean meat, poultry, or fish. Trans fats are also called "partially hydrogenated oils." They are oils that have been scientifically manipulated so that they are solid at room temperature resulting in a longer shelf life and improved taste and texture of foods in which they are added. Trans fats are found in stick margarine, some tub margarines, cookies, crackers, and baked goods.  When baking and cooking, oils are a great substitute for butter. The monounsaturated oils are  especially beneficial since it is believed they lower LDL and raise HDL. The oils you should avoid entirely are saturated tropical oils, such as coconut and palm.  Remember to eat a lot from food groups that are naturally free of saturated and trans fat, including fish, fruit, vegetables, beans, grains (barley, rice, couscous, bulgur wheat), and pasta (without cream sauces).  IDENTIFYING FOODS THAT LOWER FAT AND CHOLESTEROL  Soluble fiber may lower your cholesterol. This type of fiber is found in fruits such as apples, vegetables such as broccoli, potatoes, and carrots, legumes such as beans, peas, and lentils, and grains such as barley. Foods fortified with plant sterols (phytosterol) may also lower cholesterol. You should eat at least 2 g per day of these foods for a cholesterol lowering effect.  Read package labels to identify low-saturated fats, trans fat free, and low-fat foods at the supermarket. Select cheeses that have only 2 to 3 g saturated fat per ounce. Use a heart-healthy tub margarine that is free of trans fats or partially hydrogenated oil. When buying baked goods (cookies, crackers), avoid partially hydrogenated oils. Breads and muffins should be made from whole grains (whole-wheat or whole oat flour, instead of "flour" or "enriched flour"). Buy non-creamy canned soups with reduced salt and no added fats.  FOOD PREPARATION TECHNIQUES  Never deep-fry. If you must fry, either stir-fry, which uses very little fat, or use non-stick cooking sprays. When possible, broil, bake, or roast meats, and steam vegetables. Instead of putting butter or margarine on vegetables, use lemon and herbs, applesauce, and cinnamon (for squash and sweet potatoes). Use nonfat   yogurt, salsa, and low-fat dressings for salads.  LOW-SATURATED FAT / LOW-FAT FOOD SUBSTITUTES Meats / Saturated Fat (g)  Avoid: Steak, marbled (3 oz/85 g) / 11 g  Choose: Steak, lean (3 oz/85 g) / 4 g  Avoid: Hamburger (3 oz/85 g) / 7  g  Choose: Hamburger, lean (3 oz/85 g) / 5 g  Avoid: Ham (3 oz/85 g) / 6 g  Choose: Ham, lean cut (3 oz/85 g) / 2.4 g  Avoid: Chicken, with skin, dark meat (3 oz/85 g) / 4 g  Choose: Chicken, skin removed, dark meat (3 oz/85 g) / 2 g  Avoid: Chicken, with skin, light meat (3 oz/85 g) / 2.5 g  Choose: Chicken, skin removed, light meat (3 oz/85 g) / 1 g Dairy / Saturated Fat (g)  Avoid: Whole milk (1 cup) / 5 g  Choose: Low-fat milk, 2% (1 cup) / 3 g  Choose: Low-fat milk, 1% (1 cup) / 1.5 g  Choose: Skim milk (1 cup) / 0.3 g  Avoid: Hard cheese (1 oz/28 g) / 6 g  Choose: Skim milk cheese (1 oz/28 g) / 2 to 3 g  Avoid: Cottage cheese, 4% fat (1 cup) / 6.5 g  Choose: Low-fat cottage cheese, 1% fat (1 cup) / 1.5 g  Avoid: Ice cream (1 cup) / 9 g  Choose: Sherbet (1 cup) / 2.5 g  Choose: Nonfat frozen yogurt (1 cup) / 0.3 g  Choose: Frozen fruit bar / trace  Avoid: Whipped cream (1 tbs) / 3.5 g  Choose: Nondairy whipped topping (1 tbs) / 1 g Condiments / Saturated Fat (g)  Avoid: Mayonnaise (1 tbs) / 2 g  Choose: Low-fat mayonnaise (1 tbs) / 1 g  Avoid: Butter (1 tbs) / 7 g  Choose: Extra light margarine (1 tbs) / 1 g  Avoid: Coconut oil (1 tbs) / 11.8 g  Choose: Olive oil (1 tbs) / 1.8 g  Choose: Corn oil (1 tbs) / 1.7 g  Choose: Safflower oil (1 tbs) / 1.2 g  Choose: Sunflower oil (1 tbs) / 1.4 g  Choose: Soybean oil (1 tbs) / 2.4 g  Choose: Canola oil (1 tbs) / 1 g Document Released: 11/02/2005 Document Revised: 02/27/2013 Document Reviewed: 01/31/2014 [employer] Patient Information 2015 [employer]. This information is not intended to replace advice given to you by your health care provider. Make sure you discuss any questions you have with your health care provider.  

## 2015-04-05 LAB — CMP14+EGFR
ALBUMIN: 4.1 g/dL (ref 3.5–4.8)
ALT: 11 IU/L (ref 0–44)
AST: 13 IU/L (ref 0–40)
Albumin/Globulin Ratio: 2.6 — ABNORMAL HIGH (ref 1.1–2.5)
Alkaline Phosphatase: 44 IU/L (ref 39–117)
BILIRUBIN TOTAL: 0.5 mg/dL (ref 0.0–1.2)
BUN/Creatinine Ratio: 19 (ref 10–22)
BUN: 21 mg/dL (ref 8–27)
CO2: 26 mmol/L (ref 18–29)
Calcium: 8.7 mg/dL (ref 8.6–10.2)
Chloride: 104 mmol/L (ref 97–108)
Creatinine, Ser: 1.13 mg/dL (ref 0.76–1.27)
GFR calc Af Amer: 74 mL/min/{1.73_m2} (ref >59)
GFR, EST NON AFRICAN AMERICAN: 64 mL/min/{1.73_m2} (ref >59)
GLOBULIN, TOTAL: 1.6 g/dL (ref 1.5–4.5)
GLUCOSE: 157 mg/dL — AB (ref 65–99)
Potassium: 4.3 mmol/L (ref 3.5–5.2)
Sodium: 145 mmol/L — ABNORMAL HIGH (ref 134–144)
Total Protein: 5.7 g/dL — ABNORMAL LOW (ref 6.0–8.5)

## 2015-04-05 LAB — NMR, LIPOPROFILE
Cholesterol: 137 mg/dL (ref 100–199)
HDL CHOLESTEROL BY NMR: 40 mg/dL (ref >39)
HDL Particle Number: 36.5 umol/L (ref >=30.5)
LDL Particle Number: 944 nmol/L (ref <1000)
LDL Size: 19.7 nm (ref >20.5)
LDL-C: 49 mg/dL (ref 0–99)
LP-IR Score: 55 — ABNORMAL HIGH (ref <=45)
Small LDL Particle Number: 805 nmol/L — ABNORMAL HIGH (ref <=527)
Triglycerides by NMR: 240 mg/dL — ABNORMAL HIGH (ref 0–149)

## 2015-04-05 LAB — POCT GLYCOSYLATED HEMOGLOBIN (HGB A1C): HEMOGLOBIN A1C: 5.6

## 2015-04-05 NOTE — Addendum Note (Signed)
Addended by: Pollyann Kennedy F on: 04/05/2015 02:30 PM   Modules accepted: Orders

## 2015-05-06 ENCOUNTER — Ambulatory Visit (INDEPENDENT_AMBULATORY_CARE_PROVIDER_SITE_OTHER): Payer: Medicare Other | Admitting: Family Medicine

## 2015-05-06 ENCOUNTER — Encounter: Payer: Self-pay | Admitting: Family Medicine

## 2015-05-06 VITALS — BP 120/68 | HR 76 | Temp 97.1°F | Ht 69.0 in | Wt 209.0 lb

## 2015-05-06 DIAGNOSIS — R059 Cough, unspecified: Secondary | ICD-10-CM

## 2015-05-06 DIAGNOSIS — R05 Cough: Secondary | ICD-10-CM

## 2015-05-06 MED ORDER — LOSARTAN POTASSIUM 100 MG PO TABS: 100.0000 mg | ORAL_TABLET | Freq: Every day | ORAL | Status: DC

## 2015-05-06 MED ORDER — DOXYCYCLINE HYCLATE 100 MG PO TABS: 100.0000 mg | ORAL_TABLET | Freq: Two times a day (BID) | ORAL | Status: DC

## 2015-05-06 NOTE — Progress Notes (Signed)
Subjective:    Patient ID: RAMIRO PANGILINAN, male    DOB: 1941-04-01, 74 y.o.   MRN: [id number]  HPI 74 year old gentleman with what is now managing to chronic cough and sore throat. He has been seen several times for the same symptoms and been treated with anabiotic's broncho-dilators and the like. He had formerly taken Zyrtec. He also has a history of GERD which raises possibility of reflux as a contributing factor for some of his cough and sore throat. He is coughing up some green sputum now but generally the cough has been dry.    Review of Systems  Constitutional: Negative.   HENT: Positive for congestion.   Respiratory: Positive for wheezing.   Cardiovascular: Negative.   Genitourinary: Negative.   Neurological: Negative.   Psychiatric/Behavioral: Negative.    Patient Active Problem List   Diagnosis Date Noted  . Urinary hesitancy 04/04/2015  . Asthma, chronic 04/04/2015  . Hypertension 04/12/2013  . GAD (generalized anxiety disorder) 04/12/2013  . GERD (gastroesophageal reflux disease) 04/12/2013  . Hyperlipidemia 04/12/2013   Outpatient Encounter Prescriptions as of 05/06/2015  Medication Sig  . albuterol (PROVENTIL HFA;VENTOLIN HFA) 108 (90 BASE) MCG/ACT inhaler Inhale 2 puffs into the lungs every 6 (six) hours as needed for wheezing or shortness of breath.  . ALPRAZolam (XANAX) 1 MG tablet Take 1 tablet (1 mg total) by mouth 4 (four) times daily.  Marland Kitchen aspirin 81 MG tablet Take 81 mg by mouth daily.  Marland Kitchen escitalopram (LEXAPRO) 10 MG tablet Take 2 tablets by mouth  once daily  . fish oil-omega-3 fatty acids 1000 MG capsule Take 2 g by mouth daily.  . simvastatin (ZOCOR) 40 MG tablet TAKE ONE TABLET BY MOUTH AT BEDTIME  . tamsulosin (FLOMAX) 0.4 MG CAPS capsule Take 1 capsule (0.4 mg total) by mouth daily after supper.  . Cetirizine HCl (ZYRTEC PO) Take by mouth.  . doxycycline (VIBRA-TABS) 100 MG tablet Take 1 tablet (100 mg total) by mouth 2 (two) times daily.  Marland Kitchen losartan  (COZAAR) 100 MG tablet Take 1 tablet (100 mg total) by mouth daily.  . [DISCONTINUED] benzonatate (TESSALON) 100 MG capsule Take 1 capsule (100 mg total) by mouth 3 (three) times daily as needed for cough.  . [DISCONTINUED] HYDROcodone-homatropine (HYCODAN) 5-1.5 MG/5ML syrup Take 5 mLs by mouth every 8 (eight) hours as needed for cough.  . [DISCONTINUED] lisinopril (PRINIVIL,ZESTRIL) 10 MG tablet Take 1 tablet (10 mg total) by mouth daily.   No facility-administered encounter medications on file as of 05/06/2015.       Objective:   Physical Exam  Constitutional: He is oriented to person, place, and time. He appears well-developed and well-nourished.  HENT:  Right Ear: External ear normal.  Left Ear: External ear normal.  Mouth/Throat: Oropharynx is clear and moist. No oropharyngeal exudate.  Cardiovascular: Normal rate.   Pulmonary/Chest: Effort normal. He has wheezes.  Neurological: He is alert and oriented to person, place, and time.          Assessment & Plan:   1. Cough With wheezes and green sputum symptoms are suggestive of sinus or bronchial infection. Given chronicity of symptoms I think there has to be some other factors such as asthma, GERD, or lisinopril related cough I will try to cover for each of these areas. First doxycycline and Mucinex for what may be infectious today. Also suggested he pick up some Prilosec or Nexium OTC for GERD and for one month I will take him off the lisinopril  and substitute losartan. If cough has not improved after one month he may go back to lisinopril

## 2015-05-06 NOTE — Patient Instructions (Addendum)
Try Omeprazole and Mucinex over the counter.  Call and let us know if the cough resolves. If it doesn't please schedule a follow up appointment.

## 2015-06-05 ENCOUNTER — Encounter: Payer: Self-pay | Admitting: Internal Medicine

## 2015-06-27 ENCOUNTER — Other Ambulatory Visit: Payer: Self-pay

## 2015-06-27 DIAGNOSIS — F411 Generalized anxiety disorder: Secondary | ICD-10-CM

## 2015-06-27 NOTE — Telephone Encounter (Signed)
Last seen 05/06/15 Dr Sabra Heck  If approved print for mail order

## 2015-07-02 MED ORDER — ALPRAZOLAM 1 MG PO TABS: 1.0000 mg | ORAL_TABLET | Freq: Four times a day (QID) | ORAL | Status: DC

## 2015-07-02 NOTE — Telephone Encounter (Signed)
Patient aware that script is available at front desk

## 2015-07-09 ENCOUNTER — Other Ambulatory Visit: Payer: Self-pay | Admitting: Family Medicine

## 2015-08-06 ENCOUNTER — Encounter: Payer: Self-pay | Admitting: Family Medicine

## 2015-08-06 ENCOUNTER — Ambulatory Visit (INDEPENDENT_AMBULATORY_CARE_PROVIDER_SITE_OTHER): Payer: Medicare Other | Admitting: Family Medicine

## 2015-08-06 VITALS — BP 137/70 | HR 67 | Temp 97.0°F | Ht 69.0 in | Wt 211.8 lb

## 2015-08-06 DIAGNOSIS — N400 Enlarged prostate without lower urinary tract symptoms: Secondary | ICD-10-CM | POA: Insufficient documentation

## 2015-08-06 DIAGNOSIS — R3911 Hesitancy of micturition: Secondary | ICD-10-CM | POA: Diagnosis not present

## 2015-08-06 DIAGNOSIS — M5432 Sciatica, left side: Secondary | ICD-10-CM

## 2015-08-06 DIAGNOSIS — M5430 Sciatica, unspecified side: Secondary | ICD-10-CM | POA: Insufficient documentation

## 2015-08-06 LAB — POCT URINALYSIS DIPSTICK
BILIRUBIN UA: NEGATIVE
GLUCOSE UA: NEGATIVE
KETONES UA: NEGATIVE
Leukocytes, UA: NEGATIVE
NITRITE UA: NEGATIVE
PH UA: 6.5
Protein, UA: NEGATIVE
Spec Grav, UA: 1.015
Urobilinogen, UA: NEGATIVE

## 2015-08-06 LAB — POCT UA - MICROSCOPIC ONLY
Casts, Ur, LPF, POC: NEGATIVE
Crystals, Ur, HPF, POC: NEGATIVE
Yeast, UA: NEGATIVE

## 2015-08-06 MED ORDER — TAMSULOSIN HCL 0.4 MG PO CAPS: 0.4000 mg | ORAL_CAPSULE | Freq: Every day | ORAL | Status: DC

## 2015-08-06 MED ORDER — MELOXICAM 7.5 MG PO TABS
7.5000 mg | ORAL_TABLET | Freq: Every day | ORAL | Status: DC
Start: 2015-08-06 — End: 2015-09-06

## 2015-08-06 NOTE — Assessment & Plan Note (Signed)
Patient has had BPH previously and has been on Flomax before. He is not on it currently does not remember why he stopped it but is having a lot more issues especially at night with difficulties starting his stream and having to get up a couple times at night.

## 2015-08-06 NOTE — Progress Notes (Signed)
BP 137/70 mmHg  Pulse 67  Temp(Src) 97 F (36.1 C) (Oral)  Ht _0  (1.753 m)  Wt 211 lb 12.8 oz (96.072 kg)  BMI 31.26 kg/m2   Subjective:    Patient ID: Francisco [name], male    DOB: August 17, 1941, 74 y.o.   MRN: [id number]  HPI: Francisco [name] is a 74 y.o. male presenting on 08/06/2015 for Back Pain   HPI Urinary issues including nocturia Patient has had increased nocturia and difficulty starting his stream especially at night. He denies any major daytime symptoms. He has been on Flomax before but stopped it and cannot remember why. He has also had a TURP before with urology and would like a referral back to urology. He denies any dysuria, fevers, chills, abdominal pain or flank pain.  Sciatic pain  Patient has left-sided sciatic pain that extends from his left hip down the back of his left leg to his knee. He denies any weakness or numbness in that leg. The pain is shooting and sharp and about 3 out of 10. He has tried the occasional ibuprofen that helped most of the time.  Relevant past medical, surgical, family and social history reviewed and updated as indicated. Interim medical history since our last visit reviewed. Allergies and medications reviewed and updated.  Review of Systems  Constitutional: Negative for fever.  HENT: Negative for ear discharge and ear pain.   Eyes: Negative for discharge and visual disturbance.  Respiratory: Negative for shortness of breath and wheezing.   Cardiovascular: Negative for chest pain and leg swelling.  Gastrointestinal: Negative for abdominal pain, diarrhea and constipation.  Genitourinary: Positive for difficulty urinating. Negative for dysuria, urgency, frequency, decreased urine volume and penile pain.  Musculoskeletal: Positive for back pain. Negative for gait problem.  Skin: Negative for rash.  Neurological: Negative for tremors, syncope, weakness, light-headedness and headaches.  All other systems reviewed and are negative.   Per  HPI unless specifically indicated above     Medication List       This list is accurate as of: 08/06/15 10:40 AM.  Always use your most recent med list.               ALPRAZolam 1 MG tablet  Commonly known as:  XANAX  Take 1 tablet (1 mg total) by mouth 4 (four) times daily.     aspirin 81 MG tablet  Take 81 mg by mouth daily.     escitalopram 10 MG tablet  Commonly known as:  LEXAPRO  Take 2 tablets by mouth  once daily     fish oil-omega-3 fatty acids 1000 MG capsule  Take 2 g by mouth daily.     losartan 100 MG tablet  Commonly known as:  COZAAR  TAKE ONE TABLET BY MOUTH ONCE DAILY     meloxicam 7.5 MG tablet  Commonly known as:  MOBIC  Take 1 tablet (7.5 mg total) by mouth daily.     simvastatin 40 MG tablet  Commonly known as:  ZOCOR  TAKE ONE TABLET BY MOUTH AT BEDTIME     tamsulosin 0.4 MG Caps capsule  Commonly known as:  FLOMAX  Take 1 capsule (0.4 mg total) by mouth daily after supper.     ZYRTEC PO  Take by mouth.           Objective:    BP 137/70 mmHg  Pulse 67  Temp(Src) 97 F (36.1 C) (Oral)  Ht _1  (1.753 m)  Wt  211 lb 12.8 oz (96.072 kg)  BMI 31.26 kg/m2  Wt Readings from Last 3 Encounters:  08/06/15 211 lb 12.8 oz (96.072 kg)  05/06/15 209 lb (94.802 kg)  04/04/15 209 lb (94.802 kg)    Physical Exam  Constitutional: He is oriented to person, place, and time. He appears well-developed and well-nourished. No distress.  Eyes: Conjunctivae and EOM are normal. Pupils are equal, round, and reactive to light. Right eye exhibits no discharge. No scleral icterus.  Neck: Neck supple. No thyromegaly present.  Cardiovascular: Normal rate, regular rhythm, normal heart sounds and intact distal pulses.   No murmur heard. Pulmonary/Chest: Effort normal and breath sounds normal. No respiratory distress. He has no wheezes.  Abdominal: Soft. He exhibits no distension. There is no tenderness. There is no rebound and no guarding.  Musculoskeletal:  Normal range of motion. He exhibits tenderness (left-sided pain in lower back negative straight leg raise). He exhibits no edema.  Lymphadenopathy:    He has no cervical adenopathy.  Neurological: He is alert and oriented to person, place, and time. Coordination normal.  Skin: Skin is warm and dry. No rash noted. He is not diaphoretic.  Psychiatric: He has a normal mood and affect. His behavior is normal.  Vitals reviewed.   Results for orders placed or performed in visit on 04/04/15  CMP14+EGFR  Result Value Ref Range   Glucose 157 (H) 65 - 99 mg/dL   BUN 21 8 - 27 mg/dL   Creatinine, Ser 1.13 0.76 - 1.27 mg/dL   GFR calc non Af Amer 64 >59 mL/min/1.73   GFR calc Af Amer 74 >59 mL/min/1.73   BUN/Creatinine Ratio 19 10 - 22   Sodium 145 (H) 134 - 144 mmol/L   Potassium 4.3 3.5 - 5.2 mmol/L   Chloride 104 97 - 108 mmol/L   CO2 26 18 - 29 mmol/L   Calcium 8.7 8.6 - 10.2 mg/dL   Total Protein 5.7 (L) 6.0 - 8.5 g/dL   Albumin 4.1 3.5 - 4.8 g/dL   Globulin, Total 1.6 1.5 - 4.5 g/dL   Albumin/Globulin Ratio 2.6 (H) 1.1 - 2.5   Bilirubin Total 0.5 0.0 - 1.2 mg/dL   Alkaline Phosphatase 44 39 - 117 IU/L   AST 13 0 - 40 IU/L   ALT 11 0 - 44 IU/L  NMR, lipoprofile  Result Value Ref Range   LDL Particle Number 944 <1000 nmol/L   LDL-C 49 0 - 99 mg/dL   HDL Cholesterol by NMR 40 >39 mg/dL   Triglycerides by NMR 240 (H) 0 - 149 mg/dL   Cholesterol 137 100 - 199 mg/dL   HDL Particle Number 36.5 >=30.5 umol/L   Small LDL Particle Number 805 (H) <=527 nmol/L   LDL Size 19.7 >20.5 nm   LP-IR Score 55 (H) <=45  POCT glycosylated hemoglobin (Hb A1C)  Result Value Ref Range   Hemoglobin A1C 5.6       Assessment & Plan:   Problem List Items Addressed This Visit      Nervous and Auditory   Sciatic pain    Patient has recurrent left-sided pain extending from his left hip down into the back of his left knee that is shooting like sciatic pain. We'll try Mobic to reduce inflammation.        Relevant Medications   meloxicam (MOBIC) 7.5 MG tablet     Genitourinary   BPH (benign prostatic hyperplasia) - Primary    Patient has had BPH previously and has  been on Flomax before. He is not on it currently does not remember why he stopped it but is having a lot more issues especially at night with difficulties starting his stream and having to get up a couple times at night.      Relevant Medications   tamsulosin (FLOMAX) 0.4 MG CAPS capsule   Other Relevant Orders   Ambulatory referral to Urology     Other   Urinary hesitancy   Relevant Medications   tamsulosin (FLOMAX) 0.4 MG CAPS capsule   Other Relevant Orders   Ambulatory referral to Urology   POCT UA - Microscopic Only (Completed)   POCT urinalysis dipstick (Completed)       Follow up plan: Return in about 4 weeks (around 09/03/2015), or if symptoms worsen or fail to improve, for f/u appt with Shelah Lewandowsky, regular.  Caryl Pina, MD [facility] 08/06/2015, 10:40 AM

## 2015-08-06 NOTE — Assessment & Plan Note (Signed)
Patient has recurrent left-sided pain extending from his left hip down into the back of his left knee that is shooting like sciatic pain. We'll try Mobic to reduce inflammation.

## 2015-09-06 ENCOUNTER — Encounter: Payer: Self-pay | Admitting: Nurse Practitioner

## 2015-09-06 ENCOUNTER — Ambulatory Visit (INDEPENDENT_AMBULATORY_CARE_PROVIDER_SITE_OTHER): Payer: Medicare Other | Admitting: Nurse Practitioner

## 2015-09-06 VITALS — BP 130/76 | HR 68 | Temp 96.9°F | Ht 69.0 in | Wt 215.0 lb

## 2015-09-06 DIAGNOSIS — Z6831 Body mass index (BMI) 31.0-31.9, adult: Secondary | ICD-10-CM | POA: Diagnosis not present

## 2015-09-06 DIAGNOSIS — I10 Essential (primary) hypertension: Secondary | ICD-10-CM

## 2015-09-06 DIAGNOSIS — N400 Enlarged prostate without lower urinary tract symptoms: Secondary | ICD-10-CM

## 2015-09-06 DIAGNOSIS — F411 Generalized anxiety disorder: Secondary | ICD-10-CM | POA: Diagnosis not present

## 2015-09-06 DIAGNOSIS — K219 Gastro-esophageal reflux disease without esophagitis: Secondary | ICD-10-CM

## 2015-09-06 DIAGNOSIS — E785 Hyperlipidemia, unspecified: Secondary | ICD-10-CM

## 2015-09-06 DIAGNOSIS — M5432 Sciatica, left side: Secondary | ICD-10-CM

## 2015-09-06 DIAGNOSIS — J4520 Mild intermittent asthma, uncomplicated: Secondary | ICD-10-CM

## 2015-09-06 MED ORDER — ESCITALOPRAM OXALATE 10 MG PO TABS: ORAL_TABLET | ORAL | Status: DC

## 2015-09-06 MED ORDER — LOSARTAN POTASSIUM 100 MG PO TABS: 100.0000 mg | ORAL_TABLET | Freq: Every day | ORAL | Status: DC

## 2015-09-06 MED ORDER — SIMVASTATIN 40 MG PO TABS: ORAL_TABLET | ORAL | Status: DC

## 2015-09-06 MED ORDER — MELOXICAM 7.5 MG PO TABS: 7.5000 mg | ORAL_TABLET | Freq: Every day | ORAL | Status: DC

## 2015-09-06 NOTE — Progress Notes (Signed)
Subjective:     Patient ID: Francisco [name], male   DOB: 1941/08/16, 74 y.o.   MRN: [id number]  Patient here today for chronic follow up. He says he is doing well today   Hypertension This is a chronic problem. The current episode started more than 1 year ago. The problem is unchanged. The problem is controlled. Pertinent negatives include no chest pain, headaches, neck pain or shortness of breath. Risk factors for coronary artery disease include dyslipidemia, obesity and sedentary lifestyle. The current treatment provides moderate improvement. Compliance problems include diet and exercise.  Hypertensive end-organ damage includes CAD/MI.  Asthma There is no shortness of breath. Pertinent negatives include no chest pain, headaches or sore throat. His past medical history is significant for asthma.  Gastroesophageal Reflux He reports no chest pain, no nausea or no sore throat.  Hyperlipidemia This is a chronic problem. The current episode started more than 1 year ago. The problem is controlled. Recent lipid tests were reviewed and are normal. Exacerbating diseases include obesity. He has no history of diabetes or hypothyroidism. Pertinent negatives include no chest pain or shortness of breath. Current antihyperlipidemic treatment includes statins. The current treatment provides moderate improvement of lipids. Compliance problems include adherence to diet and adherence to exercise.  Risk factors for coronary artery disease include dyslipidemia, hypertension, male sex and a sedentary lifestyle.  GAD Patient has been anxious and was seen several months ago but seems to be doing better on lexapro. Only needs xanax on a prn basis.   Review of Systems  Constitutional: Negative.   HENT: Negative.  Negative for sore throat.   Respiratory: Negative for shortness of breath.   Cardiovascular: Negative for chest pain.  Gastrointestinal: Negative for nausea.  Genitourinary: Negative.   Musculoskeletal:  Negative for neck pain.  Neurological: Negative for headaches.  Psychiatric/Behavioral: Negative.   All other systems reviewed and are negative.      Objective:   Physical Exam  Constitutional: He is oriented to person, place, and time. He appears well-developed and well-nourished.  HENT:  Head: Normocephalic.  Right Ear: External ear normal.  Left Ear: External ear normal.  Nose: Nose normal.  Mouth/Throat: Oropharynx is clear and moist.  Eyes: EOM are normal. Pupils are equal, round, and reactive to light.  Neck: Normal range of motion. Neck supple. No thyromegaly present.  Cardiovascular: Normal rate, regular rhythm, normal heart sounds and intact distal pulses.   No murmur heard. Pulmonary/Chest: Effort normal and breath sounds normal. He has no wheezes. He has no rales.  Abdominal: Soft. Bowel sounds are normal.  Genitourinary:  Prostate check referred to urologist  Musculoskeletal: Normal range of motion.  Neurological: He is alert and oriented to person, place, and time.  Skin: Skin is warm and dry.  Psychiatric: He has a normal mood and affect. His behavior is normal. Judgment and thought content normal.    BP 130/76 mmHg  Pulse 68  Temp(Src) 96.9 F (36.1 C) (Oral)  Ht _0  (1.753 m)  Wt 215 lb (97.523 kg)  BMI 31.74 kg/m2       Assessment:   1. Essential hypertension Do not add salt to diet - losartan (COZAAR) 100 MG tablet; Take 1 tablet (100 mg total) by mouth daily.  Dispense: 90 tablet; Refill: 1 - CMP14+EGFR  2. Asthma, chronic, mild intermittent, uncomplicated  3. Gastroesophageal reflux disease without esophagitis Avoid spicy foods Do not eat 2 hours prior to bedtime   4. BPH (benign prostatic hyperplasia)  5.  GAD (generalized anxiety disorder) Stress management - escitalopram (LEXAPRO) 10 MG tablet; Take 2 tablets by mouth  once daily  Dispense: 180 tablet; Refill: 1  6. Hyperlipidemia Low fat diet - simvastatin (ZOCOR) 40 MG tablet;  TAKE ONE TABLET BY MOUTH AT BEDTIME  Dispense: 90 tablet; Refill: 1 - Lipid panel  7. BMI 31.0-31.9,adult Discussed diet and exercise for person with BMI >25 Will recheck weight in 3-6 months   8. Sciatic pain, left Continue mobic as rx - meloxicam (MOBIC) 7.5 MG tablet; Take 1 tablet (7.5 mg total) by mouth daily.  Dispense: 30 tablet; Refill: 5    Labs pending Health maintenance reviewed Diet and exercise encouraged Continue all meds Follow up  In 3 month   [place], FNP

## 2015-09-06 NOTE — Patient Instructions (Signed)

## 2015-09-07 LAB — CMP14+EGFR
ALBUMIN: 4.4 g/dL (ref 3.5–4.8)
ALT: 16 IU/L (ref 0–44)
AST: 18 IU/L (ref 0–40)
Albumin/Globulin Ratio: 3.4 — ABNORMAL HIGH (ref 1.1–2.5)
Alkaline Phosphatase: 38 IU/L — ABNORMAL LOW (ref 39–117)
BUN / CREAT RATIO: 14 (ref 10–22)
BUN: 16 mg/dL (ref 8–27)
Bilirubin Total: 1 mg/dL (ref 0.0–1.2)
CO2: 25 mmol/L (ref 18–29)
Calcium: 8.8 mg/dL (ref 8.6–10.2)
Chloride: 102 mmol/L (ref 97–106)
Creatinine, Ser: 1.17 mg/dL (ref 0.76–1.27)
GFR calc Af Amer: 71 mL/min/{1.73_m2} (ref >59)
GFR calc non Af Amer: 61 mL/min/{1.73_m2} (ref >59)
GLOBULIN, TOTAL: 1.3 g/dL — AB (ref 1.5–4.5)
GLUCOSE: 87 mg/dL (ref 65–99)
POTASSIUM: 4.2 mmol/L (ref 3.5–5.2)
SODIUM: 142 mmol/L (ref 136–144)
TOTAL PROTEIN: 5.7 g/dL — AB (ref 6.0–8.5)

## 2015-09-07 LAB — LIPID PANEL
CHOL/HDL RATIO: 3.3 ratio (ref 0.0–5.0)
CHOLESTEROL TOTAL: 141 mg/dL (ref 100–199)
HDL: 43 mg/dL (ref >39)
LDL CALC: 63 mg/dL (ref 0–99)
TRIGLYCERIDES: 173 mg/dL — AB (ref 0–149)
VLDL Cholesterol Cal: 35 mg/dL (ref 5–40)

## 2015-09-09 ENCOUNTER — Telehealth: Payer: Self-pay | Admitting: Nurse Practitioner

## 2015-09-10 ENCOUNTER — Other Ambulatory Visit: Payer: Self-pay | Admitting: Nurse Practitioner

## 2015-09-10 MED ORDER — FUROSEMIDE 20 MG PO TABS: 20.0000 mg | ORAL_TABLET | Freq: Every day | ORAL | Status: DC

## 2015-09-16 ENCOUNTER — Encounter: Payer: Self-pay | Admitting: Pediatrics

## 2015-09-16 ENCOUNTER — Ambulatory Visit (INDEPENDENT_AMBULATORY_CARE_PROVIDER_SITE_OTHER): Payer: Medicare Other | Admitting: Pediatrics

## 2015-09-16 VITALS — BP 132/71 | HR 65 | Temp 98.2°F | Ht 69.0 in | Wt 212.8 lb

## 2015-09-16 DIAGNOSIS — F411 Generalized anxiety disorder: Secondary | ICD-10-CM

## 2015-09-16 DIAGNOSIS — J019 Acute sinusitis, unspecified: Secondary | ICD-10-CM

## 2015-09-16 MED ORDER — ESCITALOPRAM OXALATE 10 MG PO TABS: ORAL_TABLET | ORAL | Status: DC

## 2015-09-16 MED ORDER — AZITHROMYCIN 250 MG PO TABS: ORAL_TABLET | ORAL | Status: DC

## 2015-09-16 MED ORDER — ALPRAZOLAM 1 MG PO TABS: 1.0000 mg | ORAL_TABLET | Freq: Two times a day (BID) | ORAL | Status: DC

## 2015-09-16 NOTE — Progress Notes (Signed)
Subjective:    Patient ID: Francisco [name], male    DOB: 12-22-1940, 74 y.o.   MRN: [id number]  CC: URI symptoms, lightheadedness  HPI: Francisco [name] is a 74 y.o. male presenting on 09/16/2015 for Cough; Nasal Congestion; and Dizziness  Two days ago was crawling underneath his house because the trailer was leaning, while underneath started to get claustrophobic, started feeling dizzy, still felt dizzy after getting out from under the house for about an hour. Coughing up green phlegm, taking robitussin, Helps some No dizziness today. Today his head feels "funny" and he sees spots in his L eye off and on Denies ever feeling like the room is spinning, but felt staggering after getting out from under the house, lasted about an hour Has had a heald cold for over a week, now coughing more L hand feels tingly sometimes but has since he had cervical fusion surgery Has had similar symptoms in the past with a URI Takes xanax twice a day in the morning and at night for anxiety Thinks he Francisco out of lexapro a while ago. No fevers.  Relevant past medical, surgical, family and social history reviewed and updated as indicated. Interim medical history since our last visit reviewed. Allergies and medications reviewed and updated.   ROS: Per HPI unless specifically indicated above  Past Medical History Patient Active Problem List   Diagnosis Date Noted  . BMI 31.0-31.9,adult 09/06/2015  . BPH (benign prostatic hyperplasia) 08/06/2015  . Sciatic pain 08/06/2015  . Urinary hesitancy 04/04/2015  . Asthma, chronic 04/04/2015  . Hypertension 04/12/2013  . GAD (generalized anxiety disorder) 04/12/2013  . GERD (gastroesophageal reflux disease) 04/12/2013  . Hyperlipidemia 04/12/2013    Current Outpatient Prescriptions  Medication Sig Dispense Refill  . ALPRAZolam (XANAX) 1 MG tablet Take 1 tablet (1 mg total) by mouth 2 (two) times daily. 1 tablet 0  . aspirin 81 MG tablet Take 81 mg by mouth  daily.    . Cetirizine HCl (ZYRTEC PO) Take by mouth.    . escitalopram (LEXAPRO) 10 MG tablet Take 2 tablets by mouth  once daily 60 tablet 3  . fish oil-omega-3 fatty acids 1000 MG capsule Take 2 g by mouth daily.    . furosemide (LASIX) 20 MG tablet Take 1 tablet (20 mg total) by mouth daily. 30 tablet 3  . losartan (COZAAR) 100 MG tablet Take 1 tablet (100 mg total) by mouth daily. 90 tablet 1  . meloxicam (MOBIC) 7.5 MG tablet Take 1 tablet (7.5 mg total) by mouth daily. 30 tablet 5  . simvastatin (ZOCOR) 40 MG tablet TAKE ONE TABLET BY MOUTH AT BEDTIME 90 tablet 1  . tamsulosin (FLOMAX) 0.4 MG CAPS capsule Take 1 capsule (0.4 mg total) by mouth daily after supper. 90 capsule 1  . azithromycin (ZITHROMAX) 250 MG tablet Take 2 the first day and then one each day after. 6 tablet 0   No current facility-administered medications for this visit.       Objective:    BP 132/71 mmHg  Pulse 65  Temp(Src) 98.2 F (36.8 C) (Oral)  Ht 5\' 9"  (1.753 m)  Wt 212 lb 12.8 oz (96.525 kg)  BMI 31.41 kg/m2  Wt Readings from Last 3 Encounters:  09/16/15 212 lb 12.8 oz (96.525 kg)  09/06/15 215 lb (97.523 kg)  08/06/15 211 lb 12.8 oz (96.072 kg)    Gen: NAD, alert, cooperative with exam, NCAT EYES: EOMI, no scleral injection or icterus ENT:  TMs mildly erythematous, OP with mild erythema, some tenderness over max sinuses and frontal sinuses b/l LYMPH: no cervical LAD CV: NRRR, normal S1/S2, no murmur, distal pulses 2+ b/l Resp: CTABL, no wheezes, normal WOB Abd: +BS, soft, NTND. no guarding or organomegaly Ext: No edema, warm Neuro: Alert and oriented, CN III-XII intact, strength equal b/l UE and LE, sensation intact, rapid alternating movements intact, gait normal. coordination grossly normal MSK: normal muscle bulk     Assessment & Plan:   Atreyu was seen today for cough, nasal congestion and episode of dizziness that resolved. Still feels head is stopped up though has had URI symptoms  for over a week with some sinus tenderness now. Will treat acute sinusitis with azithromycin given allergy to PCN. Pt also on xanax twice a day wihtout being on SSRI or similar, will restart lexapro 10mg  daily, was on 20mg  daily at some point per chart review, recommended pt take xanax only if really needed. Rx was for 4 times a day as needed though pt says he only takes it twice a day at most, updated Rx list. Will let us know if symptoms return or do not fully improve.  Diagnoses and all orders for this visit:  Acute sinusitis, recurrence not specified, unspecified location  GAD (generalized anxiety disorder) -     escitalopram (LEXAPRO) 10 MG tablet; Take 2 tablets by mouth  once daily -     ALPRAZolam (XANAX) 1 MG tablet; Take 1 tablet (1 mg total) by mouth 2 (two) times daily.  Other orders -     azithromycin (ZITHROMAX) 250 MG tablet; Take 2 the first day and then one each day after.    Follow up plan: 8 weeks  Assunta Found, MD [facility] 09/16/2015, 11:28 AM

## 2015-11-14 ENCOUNTER — Encounter: Payer: Self-pay | Admitting: *Deleted

## 2015-11-21 ENCOUNTER — Ambulatory Visit (INDEPENDENT_AMBULATORY_CARE_PROVIDER_SITE_OTHER): Payer: Medicare Other | Admitting: Family

## 2015-11-21 ENCOUNTER — Encounter: Payer: Self-pay | Admitting: Family

## 2015-11-21 VITALS — BP 107/65 | HR 78 | Temp 98.0°F | Ht 69.0 in | Wt 215.0 lb

## 2015-11-21 DIAGNOSIS — J209 Acute bronchitis, unspecified: Secondary | ICD-10-CM

## 2015-11-21 MED ORDER — LEVOFLOXACIN 500 MG PO TABS: 500.0000 mg | ORAL_TABLET | Freq: Every day | ORAL | Status: DC

## 2015-11-21 MED ORDER — ALBUTEROL SULFATE HFA 108 (90 BASE) MCG/ACT IN AERS: 2.0000 | INHALATION_SPRAY | Freq: Four times a day (QID) | RESPIRATORY_TRACT | Status: DC | PRN

## 2015-11-21 MED ORDER — HYDROCODONE-HOMATROPINE 5-1.5 MG/5ML PO SYRP: 5.0000 mL | ORAL_SOLUTION | Freq: Three times a day (TID) | ORAL | Status: DC | PRN

## 2015-11-21 MED ORDER — BENZONATATE 200 MG PO CAPS: 200.0000 mg | ORAL_CAPSULE | Freq: Three times a day (TID) | ORAL | Status: DC | PRN

## 2015-11-21 MED ORDER — PREDNISONE 10 MG (21) PO TBPK: 10.0000 mg | ORAL_TABLET | Freq: Every day | ORAL | Status: DC

## 2015-11-21 NOTE — Progress Notes (Signed)
Subjective:    Patient ID: Francisco [name], male    DOB: 02/06/1941, 75 y.o.   MRN: [id number]  Cough This is a new problem. The current episode started more than 1 month ago. The problem has been gradually worsening. The problem occurs every few minutes. The cough is productive of purulent sputum. Associated symptoms include chills, headaches, myalgias, nasal congestion, postnasal drip, rhinorrhea, a sore throat and wheezing. Pertinent negatives include no ear congestion, ear pain or fever. The symptoms are aggravated by lying down. He has tried rest and OTC cough suppressant (Mucinex) for the symptoms. The treatment provided mild relief. His past medical history is significant for COPD. There is no history of asthma or environmental allergies.      Review of Systems  Constitutional: Positive for chills. Negative for fever.  HENT: Positive for postnasal drip, rhinorrhea and sore throat. Negative for ear pain.   Respiratory: Positive for cough and wheezing.   Cardiovascular: Negative.   Gastrointestinal: Negative.   Endocrine: Negative.   Genitourinary: Negative.   Musculoskeletal: Positive for myalgias.  Allergic/Immunologic: Negative for environmental allergies.  Neurological: Positive for headaches.  Hematological: Negative.   Psychiatric/Behavioral: Negative.   All other systems reviewed and are negative.      Objective:   Physical Exam  Constitutional: He is oriented to person, place, and time. He appears well-developed and well-nourished. No distress.  HENT:  Head: Normocephalic.  Right Ear: External ear normal.  Left Ear: External ear normal.  Nasal passage erythemas with mild swelling  Oropharynx erythemas  Eyes: Pupils are equal, round, and reactive to light. Right eye exhibits no discharge. Left eye exhibits no discharge.  Neck: Normal range of motion. Neck supple. No thyromegaly present.  Cardiovascular: Normal rate, regular rhythm, normal heart sounds and intact  distal pulses.   No murmur heard. Pulmonary/Chest: Effort normal. No respiratory distress. He has wheezes.  Abdominal: Soft. Bowel sounds are normal. He exhibits no distension. There is no tenderness.  Musculoskeletal: Normal range of motion. He exhibits no edema or tenderness.  Neurological: He is alert and oriented to person, place, and time. He has normal reflexes. No cranial nerve deficit.  Skin: Skin is warm and dry. No rash noted. No erythema.  Psychiatric: He has a normal mood and affect. His behavior is normal. Judgment and thought content normal.  Vitals reviewed.     BP 107/65 mmHg  Pulse 78  Temp(Src) 98 F (36.7 C) (Oral)  Ht 5\' 9"  (1.753 m)  Wt 215 lb (97.523 kg)  BMI 31.74 kg/m2     Assessment & Plan:  1. Acute bronchitis, unspecified organism -- Take meds as prescribed - Use a cool mist humidifier  -Use saline nose sprays frequently -Saline irrigations of the nose can be very helpful if done frequently.  * 4X daily for 1 week*  * Use of a nettie pot can be helpful with this. Follow directions with this* -Force fluids -For any cough or congestion  Use plain Mucinex- regular strength or max strength is fine   * Children- consult with Pharmacist for dosing -For fever or aces or pains- take tylenol or ibuprofen appropriate for age and weight.  * for fevers greater than 101 orally you may alternate ibuprofen and tylenol every  3 hours. -Throat lozenges if help - predniSONE (STERAPRED UNI-PAK 21 TAB) 10 MG (21) TBPK tablet; Take 1 tablet (10 mg total) by mouth daily. As directed x 6 days  Dispense: 21 tablet; Refill: 0 - levofloxacin (LEVAQUIN)  500 MG tablet; Take 1 tablet (500 mg total) by mouth daily.  Dispense: 7 tablet; Refill: 0 - benzonatate (TESSALON) 200 MG capsule; Take 1 capsule (200 mg total) by mouth 3 (three) times daily as needed.  Dispense: 30 capsule; Refill: 1 - HYDROcodone-homatropine (HYCODAN) 5-1.5 MG/5ML syrup; Take 5 mLs by mouth every 8 (eight)  hours as needed for cough.  Dispense: 120 mL; Refill: 0 - albuterol (PROVENTIL HFA;VENTOLIN HFA) 108 (90 Base) MCG/ACT inhaler; Inhale 2 puffs into the lungs every 6 (six) hours as needed for wheezing or shortness of breath.  Dispense: 1 Inhaler; Refill: [place], FNP

## 2015-11-21 NOTE — Patient Instructions (Signed)
Acute Bronchitis Bronchitis is inflammation of the airways that extend from the windpipe into the lungs (bronchi). The inflammation often causes mucus to develop. This leads to a cough, which is the most common symptom of bronchitis.  In acute bronchitis, the condition usually develops suddenly and goes away over time, usually in a couple weeks. Smoking, allergies, and asthma can make bronchitis worse. Repeated episodes of bronchitis may cause further lung problems.  CAUSES Acute bronchitis is most often caused by the same virus that causes a cold. The virus can spread from person to person (contagious) through coughing, sneezing, and touching contaminated objects. SIGNS AND SYMPTOMS   Cough.   Fever.   Coughing up mucus.   Body aches.   Chest congestion.   Chills.   Shortness of breath.   Sore throat.  DIAGNOSIS  Acute bronchitis is usually diagnosed through a physical exam. Your health care provider will also ask you questions about your medical history. Tests, such as chest X-rays, are sometimes done to rule out other conditions.  TREATMENT  Acute bronchitis usually goes away in a couple weeks. Oftentimes, no medical treatment is necessary. Medicines are sometimes given for relief of fever or cough. Antibiotic medicines are usually not needed but may be prescribed in certain situations. In some cases, an inhaler may be recommended to help reduce shortness of breath and control the cough. A cool mist vaporizer may also be used to help thin bronchial secretions and make it easier to clear the chest.  HOME CARE INSTRUCTIONS  Get plenty of rest.   Drink enough fluids to keep your urine clear or pale yellow (unless you have a medical condition that requires fluid restriction). Increasing fluids may help thin your respiratory secretions (sputum) and reduce chest congestion, and it will prevent dehydration.   Take medicines only as directed by your health care provider.  If  you were prescribed an antibiotic medicine, finish it all even if you start to feel better.  Avoid smoking and secondhand smoke. Exposure to cigarette smoke or irritating chemicals will make bronchitis worse. If you are a smoker, consider using nicotine gum or skin patches to help control withdrawal symptoms. Quitting smoking will help your lungs heal faster.   Reduce the chances of another bout of acute bronchitis by washing your hands frequently, avoiding people with cold symptoms, and trying not to touch your hands to your mouth, nose, or eyes.   Keep all follow-up visits as directed by your health care provider.  SEEK MEDICAL CARE IF: Your symptoms do not improve after 1 week of treatment.  SEEK IMMEDIATE MEDICAL CARE IF:  You develop an increased fever or chills.   You have chest pain.   You have severe shortness of breath.  You have bloody sputum.   You develop dehydration.  You faint or repeatedly feel like you are going to pass out.  You develop repeated vomiting.  You develop a severe headache. MAKE SURE YOU:   Understand these instructions.  Will watch your condition.  Will get help right away if you are not doing well or get worse.   This information is not intended to replace advice given to you by your health care provider. Make sure you discuss any questions you have with your health care provider.   Document Released: 12/10/2004 Document Revised: 11/23/2014 Document Reviewed: 04/25/2013 Elsevier Interactive Patient Education 2016 [employer]  - Take meds as prescribed - Use a cool mist humidifier  -Use saline nose sprays frequently -Saline   irrigations of the nose can be very helpful if done frequently.  * 4X daily for 1 week*  * Use of a nettie pot can be helpful with this. Follow directions with this* -Force fluids -For any cough or congestion  Use plain Mucinex- regular strength or max strength is fine   * Children- consult with Pharmacist for  dosing -For fever or aces or pains- take tylenol or ibuprofen appropriate for age and weight.  * for fevers greater than 101 orally you may alternate ibuprofen and tylenol every  3 hours. -Throat lozenges if help    Shaye Lagace, FNP  

## 2015-12-04 DIAGNOSIS — H2513 Age-related nuclear cataract, bilateral: Secondary | ICD-10-CM | POA: Diagnosis not present

## 2015-12-04 DIAGNOSIS — H40033 Anatomical narrow angle, bilateral: Secondary | ICD-10-CM | POA: Diagnosis not present

## 2015-12-09 ENCOUNTER — Ambulatory Visit: Payer: Medicare Other | Admitting: Nurse Practitioner

## 2015-12-10 ENCOUNTER — Encounter: Payer: Self-pay | Admitting: Nurse Practitioner

## 2015-12-10 ENCOUNTER — Ambulatory Visit (INDEPENDENT_AMBULATORY_CARE_PROVIDER_SITE_OTHER): Payer: Medicare Other | Admitting: Nurse Practitioner

## 2015-12-10 VITALS — BP 124/68 | HR 70 | Temp 96.7°F | Ht 69.0 in | Wt 211.0 lb

## 2015-12-10 DIAGNOSIS — F411 Generalized anxiety disorder: Secondary | ICD-10-CM

## 2015-12-10 DIAGNOSIS — I10 Essential (primary) hypertension: Secondary | ICD-10-CM

## 2015-12-10 DIAGNOSIS — N400 Enlarged prostate without lower urinary tract symptoms: Secondary | ICD-10-CM | POA: Diagnosis not present

## 2015-12-10 DIAGNOSIS — K219 Gastro-esophageal reflux disease without esophagitis: Secondary | ICD-10-CM | POA: Diagnosis not present

## 2015-12-10 DIAGNOSIS — E785 Hyperlipidemia, unspecified: Secondary | ICD-10-CM | POA: Diagnosis not present

## 2015-12-10 DIAGNOSIS — R3911 Hesitancy of micturition: Secondary | ICD-10-CM | POA: Diagnosis not present

## 2015-12-10 DIAGNOSIS — Z6831 Body mass index (BMI) 31.0-31.9, adult: Secondary | ICD-10-CM

## 2015-12-10 MED ORDER — TAMSULOSIN HCL 0.4 MG PO CAPS: 0.4000 mg | ORAL_CAPSULE | Freq: Every day | ORAL | Status: DC

## 2015-12-10 MED ORDER — ESCITALOPRAM OXALATE 10 MG PO TABS: ORAL_TABLET | ORAL | Status: DC

## 2015-12-10 MED ORDER — FUROSEMIDE 20 MG PO TABS: 20.0000 mg | ORAL_TABLET | Freq: Every day | ORAL | Status: DC

## 2015-12-10 MED ORDER — ALPRAZOLAM 1 MG PO TABS: 1.0000 mg | ORAL_TABLET | Freq: Two times a day (BID) | ORAL | Status: DC

## 2015-12-10 NOTE — Addendum Note (Signed)
Addended by: [name] on: 12/10/2015 03:05 PM   Modules accepted: Orders

## 2015-12-10 NOTE — Progress Notes (Signed)
Subjective:     Patient ID: Francisco [name], male   DOB: Jan 05, 1941, 75 y.o.   MRN: [id number]  Patient here today for follow up of chronic medical problems.  Outpatient Encounter Prescriptions as of 12/10/2015  Medication Sig  . albuterol (PROVENTIL HFA;VENTOLIN HFA) 108 (90 Base) MCG/ACT inhaler Inhale 2 puffs into the lungs every 6 (six) hours as needed for wheezing or shortness of breath.  . ALPRAZolam (XANAX) 1 MG tablet Take 1 tablet (1 mg total) by mouth 2 (two) times daily.  Marland Kitchen aspirin 81 MG tablet Take 81 mg by mouth daily.  . Cetirizine HCl (ZYRTEC PO) Take by mouth. Reported on 11/21/2015  . escitalopram (LEXAPRO) 10 MG tablet Take 2 tablets by mouth  once daily  . fish oil-omega-3 fatty acids 1000 MG capsule Take 2 g by mouth daily.  . furosemide (LASIX) 20 MG tablet Take 1 tablet (20 mg total) by mouth daily.  Marland Kitchen losartan (COZAAR) 100 MG tablet Take 1 tablet (100 mg total) by mouth daily.  . meloxicam (MOBIC) 7.5 MG tablet Take 1 tablet (7.5 mg total) by mouth daily.  . predniSONE (STERAPRED UNI-PAK 21 TAB) 10 MG (21) TBPK tablet Take 1 tablet (10 mg total) by mouth daily. As directed x 6 days  . simvastatin (ZOCOR) 40 MG tablet TAKE ONE TABLET BY MOUTH AT BEDTIME  . tamsulosin (FLOMAX) 0.4 MG CAPS capsule Take 1 capsule (0.4 mg total) by mouth daily after supper.  . [DISCONTINUED] benzonatate (TESSALON) 200 MG capsule Take 1 capsule (200 mg total) by mouth 3 (three) times daily as needed.  . [DISCONTINUED] HYDROcodone-homatropine (HYCODAN) 5-1.5 MG/5ML syrup Take 5 mLs by mouth every 8 (eight) hours as needed for cough.  . [DISCONTINUED] levofloxacin (LEVAQUIN) 500 MG tablet Take 1 tablet (500 mg total) by mouth daily.   No facility-administered encounter medications on file as of 12/10/2015.     Hypertension This is a chronic problem. The current episode started more than 1 year ago. The problem is unchanged. The problem is controlled. Pertinent negatives include no chest pain,  headaches, neck pain or shortness of breath. Risk factors for coronary artery disease include dyslipidemia, obesity and sedentary lifestyle. The current treatment provides moderate improvement. Compliance problems include diet and exercise.  Hypertensive end-organ damage includes CAD/MI.  Asthma There is no shortness of breath. Pertinent negatives include no chest pain, headaches or sore throat. His past medical history is significant for asthma.  Gastroesophageal Reflux He reports no chest pain, no nausea or no sore throat.  Hyperlipidemia This is a chronic problem. The current episode started more than 1 year ago. The problem is controlled. Recent lipid tests were reviewed and are normal. Exacerbating diseases include obesity. He has no history of diabetes or hypothyroidism. Pertinent negatives include no chest pain or shortness of breath. Current antihyperlipidemic treatment includes statins. The current treatment provides moderate improvement of lipids. Compliance problems include adherence to diet and adherence to exercise.  Risk factors for coronary artery disease include dyslipidemia, hypertension, male sex and a sedentary lifestyle.  GAD Patient has been anxious and was seen several months ago but seems to be doing better on lexapro. Only needs xanax on a prn basis. BPH Currently on flomax- no trouble voiding as long as takes meds      Review of Systems  Constitutional: Negative.   HENT: Negative.  Negative for sore throat.   Respiratory: Negative for shortness of breath.   Cardiovascular: Negative for chest pain.  Gastrointestinal: Negative for  nausea.  Genitourinary: Negative.   Musculoskeletal: Negative for neck pain.  Neurological: Negative for headaches.  Psychiatric/Behavioral: Negative.   All other systems reviewed and are negative.      Objective:   Physical Exam  Constitutional: He is oriented to person, place, and time. He appears well-developed and well-nourished.   HENT:  Head: Normocephalic.  Right Ear: External ear normal.  Left Ear: External ear normal.  Nose: Nose normal.  Mouth/Throat: Oropharynx is clear and moist.  Eyes: EOM are normal. Pupils are equal, round, and reactive to light.  Neck: Normal range of motion. Neck supple. No thyromegaly present.  Cardiovascular: Normal rate, regular rhythm, normal heart sounds and intact distal pulses.   No murmur heard. Pulmonary/Chest: Effort normal and breath sounds normal. He has no wheezes. He has no rales.  Abdominal: Soft. Bowel sounds are normal.  Genitourinary:  Prostate check referred to urologist  Musculoskeletal: Normal range of motion.  Neurological: He is alert and oriented to person, place, and time.  Skin: Skin is warm and dry.  Psychiatric: He has a normal mood and affect. His behavior is normal. Judgment and thought content normal.    BP 124/68 mmHg  Pulse 70  Temp(Src) 96.7 F (35.9 C) (Oral)  Ht 5' 9"  (1.753 m)  Wt 211 lb (95.709 kg)  BMI 31.15 kg/m2       Assessment:  1. Essential hypertension Do not add salt to diet - CMP14+EGFR - furosemide (LASIX) 20 MG tablet; Take 1 tablet (20 mg total) by mouth daily.  Dispense: 30 tablet; Refill: 3  2. Hyperlipidemia Low fat diet - Lipid panel  3. Gastroesophageal reflux disease without esophagitis Avoid spicy foods Do not eat 2 hours prior to bedtime   4. BPH (benign prostatic hyperplasia) - tamsulosin (FLOMAX) 0.4 MG CAPS capsule; Take 1 capsule (0.4 mg total) by mouth daily after supper.  Dispense: 90 capsule; Refill: 1  5. GAD (generalized anxiety disorder) Stress management - ALPRAZolam (XANAX) 1 MG tablet; Take 1 tablet (1 mg total) by mouth 2 (two) times daily.  Dispense: 1 tablet; Refill: 0 - escitalopram (LEXAPRO) 10 MG tablet; Take 2 tablets by mouth  once daily  Dispense: 60 tablet; Refill: 3  6. BMI 31.0-31.9,adult Discussed diet and exercise for person with BMI >25 Will recheck weight in 3-6  months  7. Urinary hesitancy - tamsulosin (FLOMAX) 0.4 MG CAPS capsule; Take 1 capsule (0.4 mg total) by mouth daily after supper.  Dispense: 90 capsule; Refill: 1    Labs pending Health maintenance reviewed Diet and exercise encouraged Continue all meds Follow up  In 3 months   [place], FNP

## 2015-12-10 NOTE — Patient Instructions (Signed)

## 2015-12-11 LAB — CMP14+EGFR
ALT: 13 IU/L (ref 0–44)
AST: 15 IU/L (ref 0–40)
Albumin/Globulin Ratio: 2.8 — ABNORMAL HIGH (ref 1.1–2.5)
Albumin: 4.2 g/dL (ref 3.5–4.8)
Alkaline Phosphatase: 45 IU/L (ref 39–117)
BUN/Creatinine Ratio: 18 (ref 10–22)
BUN: 20 mg/dL (ref 8–27)
Bilirubin Total: 0.6 mg/dL (ref 0.0–1.2)
CALCIUM: 8.6 mg/dL (ref 8.6–10.2)
CO2: 24 mmol/L (ref 18–29)
CREATININE: 1.09 mg/dL (ref 0.76–1.27)
Chloride: 102 mmol/L (ref 96–106)
GFR calc Af Amer: 77 mL/min/{1.73_m2} (ref >59)
GFR, EST NON AFRICAN AMERICAN: 67 mL/min/{1.73_m2} (ref >59)
Globulin, Total: 1.5 g/dL (ref 1.5–4.5)
Glucose: 110 mg/dL — ABNORMAL HIGH (ref 65–99)
Potassium: 4.3 mmol/L (ref 3.5–5.2)
Sodium: 142 mmol/L (ref 134–144)
TOTAL PROTEIN: 5.7 g/dL — AB (ref 6.0–8.5)

## 2015-12-11 LAB — LIPID PANEL
CHOL/HDL RATIO: 3.8 ratio (ref 0.0–5.0)
Cholesterol, Total: 142 mg/dL (ref 100–199)
HDL: 37 mg/dL — AB (ref >39)
LDL CALC: 52 mg/dL (ref 0–99)
TRIGLYCERIDES: 264 mg/dL — AB (ref 0–149)
VLDL CHOLESTEROL CAL: 53 mg/dL — AB (ref 5–40)

## 2015-12-23 ENCOUNTER — Encounter: Payer: Self-pay | Admitting: *Deleted

## 2015-12-30 ENCOUNTER — Telehealth: Payer: Self-pay | Admitting: Nurse Practitioner

## 2015-12-30 NOTE — Telephone Encounter (Signed)
Pt given appt tomorrow at 9:45 with MMM.

## 2015-12-31 ENCOUNTER — Ambulatory Visit (INDEPENDENT_AMBULATORY_CARE_PROVIDER_SITE_OTHER): Payer: Medicare Other | Admitting: Nurse Practitioner

## 2015-12-31 ENCOUNTER — Encounter: Payer: Self-pay | Admitting: Nurse Practitioner

## 2015-12-31 VITALS — BP 129/78 | HR 70 | Temp 97.1°F | Ht 69.0 in | Wt 212.0 lb

## 2015-12-31 DIAGNOSIS — K439 Ventral hernia without obstruction or gangrene: Secondary | ICD-10-CM

## 2015-12-31 NOTE — Progress Notes (Signed)
   Subjective:    Patient ID: Francisco [name], male    DOB: [birth year], 75 y.o.   MRN: [id number]  HPI Patient in today c/o noticing a bulge at belly button- says that usually notices when he is going from laying to sitting. Dull aches at times.    Review of Systems  Constitutional: Negative.   HENT: Negative.   Respiratory: Negative.   Cardiovascular: Negative.   Genitourinary: Negative.   Neurological: Negative.   Psychiatric/Behavioral: Negative.   All other systems reviewed and are negative.      Objective:   Physical Exam  Constitutional: He is oriented to person, place, and time. He appears well-developed and well-nourished.  Cardiovascular: Normal rate, regular rhythm and normal heart sounds.   Pulmonary/Chest: Effort normal and breath sounds normal.  Abdominal: Soft. Bowel sounds are normal.  Bulge in mid upper abdomen from rib cage down to belly button. Mild tenderness on palpation  Neurological: He is alert and oriented to person, place, and time.  Skin: Skin is warm and dry.  Psychiatric: He has a normal mood and affect. His behavior is normal. Judgment and thought content normal.    BP 129/78 mmHg  Pulse 70  Temp(Src) 97.1 F (36.2 C) (Oral)  Ht 5\' 9"  (1.753 m)  Wt 212 lb (96.163 kg)  BMI 31.29 kg/m2       Assessment & Plan:   1. Hernia of abdominal wall    Avoid straining Orders Placed This Encounter  Procedures  . Ambulatory referral to General Surgery    Referral Priority:  Routine    Referral Type:  Surgical    Referral Reason:  Specialty Services Required    Requested Specialty:  General Surgery    Number of Visits Requested:  1   RTO prn  [name], FNP

## 2015-12-31 NOTE — Patient Instructions (Signed)
Hernia, Adult A hernia is the bulging of an organ or tissue through a weak spot in the muscles of the abdomen (abdominal wall). Hernias develop most often near the navel or groin. There are many kinds of hernias. Common kinds include:  Femoral hernia. This kind of hernia develops under the groin in the upper thigh area.  Inguinal hernia. This kind of hernia develops in the groin or scrotum.  Umbilical hernia. This kind of hernia develops near the navel.  Hiatal hernia. This kind of hernia causes part of the stomach to be pushed up into the chest.  Incisional hernia. This kind of hernia bulges through a scar from an abdominal surgery. CAUSES This condition may be caused by:  Heavy lifting.  Coughing over a long period of time.  Straining to have a bowel movement.  An incision made during an abdominal surgery.  A birth defect (congenital defect).  Excess weight or obesity.  Smoking.  Poor nutrition.  Cystic fibrosis.  Excess fluid in the abdomen.  Undescended testicles. SYMPTOMS Symptoms of a hernia include:  A lump on the abdomen. This is the first sign of a hernia. The lump may become more obvious with standing, straining, or coughing. It may get bigger over time if it is not treated or if the condition causing it is not treated.  Pain. A hernia is usually painless, but it may become painful over time if treatment is delayed. The pain is usually dull and may get worse with standing or lifting heavy objects. Sometimes a hernia gets tightly squeezed in the weak spot (strangulated) or stuck there (incarcerated) and causes additional symptoms. These symptoms may include:  Vomiting.  Nausea.  Constipation.  Irritability. DIAGNOSIS A hernia may be diagnosed with:  A physical exam. During the exam your health care provider may ask you to cough or to make a specific movement, because a hernia is usually more visible when you move.  Imaging tests. These can  include:  X-rays.  Ultrasound.  CT scan. TREATMENT A hernia that is small and painless may not need to be treated. A hernia that is large or painful may be treated with surgery. Inguinal hernias may be treated with surgery to prevent incarceration or strangulation. Strangulated hernias are always treated with surgery, because lack of blood to the trapped organ or tissue can cause it to die. Surgery to treat a hernia involves pushing the bulge back into place and repairing the weak part of the abdomen. HOME CARE INSTRUCTIONS  Avoid straining.  Do not lift anything heavier than 10 lb (4.5 kg).  Lift with your leg muscles, not your back muscles. This helps avoid strain.  When coughing, try to cough gently.  Prevent constipation. Constipation leads to straining with bowel movements, which can make a hernia worse or cause a hernia repair to break down. You can prevent constipation by:  Eating a high-fiber diet that includes plenty of fruits and vegetables.  Drinking enough fluids to keep your urine clear or pale yellow. Aim to drink 6-8 glasses of water per day.  Using a stool softener as directed by your health care provider.  Lose weight, if you are overweight.  Do not use any tobacco products, including cigarettes, chewing tobacco, or electronic cigarettes. If you need help quitting, ask your health care provider.  Keep all follow-up visits as directed by your health care provider. This is important. Your health care provider may need to monitor your condition. SEEK MEDICAL CARE IF:  You have   swelling, redness, and pain in the affected area.  Your bowel habits change. SEEK IMMEDIATE MEDICAL CARE IF:  You have a fever.  You have abdominal pain that is getting worse.  You feel nauseous or you vomit.  You cannot push the hernia back in place by gently pressing on it while you are lying down.  The hernia:  Changes in shape or size.  Is stuck outside the  abdomen.  Becomes discolored.  Feels hard or tender.   This information is not intended to replace advice given to you by your health care provider. Make sure you discuss any questions you have with your health care provider.   Document Released: 11/02/2005 Document Revised: 11/23/2014 Document Reviewed: 09/12/2014 [employer] Interactive Patient Education 2016 [employer]  

## 2016-01-03 ENCOUNTER — Telehealth: Payer: Self-pay | Admitting: Nurse Practitioner

## 2016-01-03 NOTE — Telephone Encounter (Signed)
Yes will be fine to wear brace

## 2016-01-03 NOTE — Telephone Encounter (Signed)
Would it be ok for him to wear a brace to hold his hernia in. He has one and wanted to check with you

## 2016-01-09 DIAGNOSIS — M6208 Separation of muscle (nontraumatic), other site: Secondary | ICD-10-CM | POA: Diagnosis not present

## 2016-01-14 ENCOUNTER — Telehealth: Payer: Self-pay

## 2016-01-14 NOTE — Telephone Encounter (Signed)
[id number] no showed for appt with Urology

## 2016-01-14 NOTE — Telephone Encounter (Signed)
Please call patient and see why he missed urology appointment

## 2016-01-23 ENCOUNTER — Encounter: Payer: Self-pay | Admitting: Family

## 2016-01-23 ENCOUNTER — Ambulatory Visit (INDEPENDENT_AMBULATORY_CARE_PROVIDER_SITE_OTHER): Payer: Medicare Other | Admitting: Family

## 2016-01-23 VITALS — BP 133/71 | HR 68 | Temp 97.4°F | Ht 69.0 in | Wt 208.0 lb

## 2016-01-23 DIAGNOSIS — M5442 Lumbago with sciatica, left side: Secondary | ICD-10-CM

## 2016-01-23 DIAGNOSIS — R1011 Right upper quadrant pain: Secondary | ICD-10-CM | POA: Diagnosis not present

## 2016-01-23 LAB — URINALYSIS
BILIRUBIN UA: NEGATIVE
Glucose, UA: NEGATIVE
Leukocytes, UA: NEGATIVE
NITRITE UA: NEGATIVE
PH UA: 6 (ref 5.0–7.5)
Protein, UA: NEGATIVE
RBC UA: NEGATIVE
Specific Gravity, UA: 1.025 (ref 1.005–1.030)
UUROB: 1 mg/dL (ref 0.2–1.0)

## 2016-01-23 LAB — CBC WITH DIFFERENTIAL/PLATELET
BASOS ABS: 0 10*3/uL (ref 0.0–0.2)
Basos: 1 %
EOS (ABSOLUTE): 0.2 10*3/uL (ref 0.0–0.4)
Eos: 4 %
HEMATOCRIT: 37.6 % (ref 37.5–51.0)
HEMOGLOBIN: 12.5 g/dL — AB (ref 12.6–17.7)
IMMATURE GRANS (ABS): 0 10*3/uL (ref 0.0–0.1)
IMMATURE GRANULOCYTES: 0 %
LYMPHS ABS: 1.2 10*3/uL (ref 0.7–3.1)
Lymphs: 21 %
MCH: 32.2 pg (ref 26.6–33.0)
MCHC: 33.2 g/dL (ref 31.5–35.7)
MCV: 97 fL (ref 79–97)
MONOCYTES: 9 %
Monocytes Absolute: 0.5 10*3/uL (ref 0.1–0.9)
NEUTROS ABS: 3.9 10*3/uL (ref 1.4–7.0)
Neutrophils: 65 %
Platelets: 152 10*3/uL (ref 150–379)
RBC: 3.88 x10E6/uL — ABNORMAL LOW (ref 4.14–5.80)
RDW: 14.1 % (ref 12.3–15.4)
WBC: 6 10*3/uL (ref 3.4–10.8)

## 2016-01-23 MED ORDER — GABAPENTIN 300 MG PO CAPS: 300.0000 mg | ORAL_CAPSULE | Freq: Three times a day (TID) | ORAL | Status: DC

## 2016-01-23 NOTE — Patient Instructions (Signed)
Sciatica With Rehab The sciatic nerve runs from the back down the leg and is responsible for sensation and control of the muscles in the back (posterior) side of the thigh, lower leg, and foot. Sciatica is a condition that is characterized by inflammation of this nerve.  SYMPTOMS   Signs of nerve damage, including numbness and/or weakness along the posterior side of the lower extremity.  Pain in the back of the thigh that may also travel down the leg.  Pain that worsens when sitting for long periods of time.  Occasionally, pain in the back or buttock. CAUSES  Inflammation of the sciatic nerve is the cause of sciatica. The inflammation is due to something irritating the nerve. Common sources of irritation include:  Sitting for long periods of time.  Direct trauma to the nerve.  Arthritis of the spine.  Herniated or ruptured disk.  Slipping of the vertebrae (spondylolisthesis).  Pressure from soft tissues, such as muscles or ligament-like tissue (fascia). RISK INCREASES WITH:  Sports that place pressure or stress on the spine (football or weightlifting).  Poor strength and flexibility.  Failure to warm up properly before activity.  Family history of low back pain or disk disorders.  Previous back injury or surgery.  Poor body mechanics, especially when lifting, or poor posture. PREVENTION   Warm up and stretch properly before activity.  Maintain physical fitness:  Strength, flexibility, and endurance.  Cardiovascular fitness.  Learn and use proper technique, especially with posture and lifting. When possible, have coach correct improper technique.  Avoid activities that place stress on the spine. PROGNOSIS If treated properly, then sciatica usually resolves within 6 weeks. However, occasionally surgery is necessary.  RELATED COMPLICATIONS   Permanent nerve damage, including pain, numbness, tingle, or weakness.  Chronic back pain.  Risks of surgery: infection,  bleeding, nerve damage, or damage to surrounding tissues. TREATMENT Treatment initially involves resting from any activities that aggravate your symptoms. The use of ice and medication may help reduce pain and inflammation. The use of strengthening and stretching exercises may help reduce pain with activity. These exercises may be performed at home or with referral to a therapist. A therapist may recommend further treatments, such as transcutaneous electronic nerve stimulation (TENS) or ultrasound. Your caregiver may recommend corticosteroid injections to help reduce inflammation of the sciatic nerve. If symptoms persist despite non-surgical (conservative) treatment, then surgery may be recommended. MEDICATION  If pain medication is necessary, then nonsteroidal anti-inflammatory medications, such as aspirin and ibuprofen, or other minor pain relievers, such as acetaminophen, are often recommended.  Do not take pain medication for 7 days before surgery.  Prescription pain relievers may be given if deemed necessary by your caregiver. Use only as directed and only as much as you need.  Ointments applied to the skin may be helpful.  Corticosteroid injections may be given by your caregiver. These injections should be reserved for the most serious cases, because they may only be given a certain number of times. HEAT AND COLD  Cold treatment (icing) relieves pain and reduces inflammation. Cold treatment should be applied for 10 to 15 minutes every 2 to 3 hours for inflammation and pain and immediately after any activity that aggravates your symptoms. Use ice packs or massage the area with a piece of ice (ice massage).  Heat treatment may be used prior to performing the stretching and strengthening activities prescribed by your caregiver, physical therapist, or athletic trainer. Use a heat pack or soak the injury in warm water.   SEEK MEDICAL CARE IF:  Treatment seems to offer no benefit, or the condition  worsens.  Any medications produce adverse side effects. EXERCISES  RANGE OF MOTION (ROM) AND STRETCHING EXERCISES - Sciatica Most people with sciatic will find that their symptoms worsen with either excessive bending forward (flexion) or arching at the low back (extension). The exercises which will help resolve your symptoms will focus on the opposite motion. Your physician, physical therapist or athletic trainer will help you determine which exercises will be most helpful to resolve your low back pain. Do not complete any exercises without first consulting with your clinician. Discontinue any exercises which worsen your symptoms until you speak to your clinician. If you have pain, numbness or tingling which travels down into your buttocks, leg or foot, the goal of the therapy is for these symptoms to move closer to your back and eventually resolve. Occasionally, these leg symptoms will get better, but your low back pain may worsen; this is typically an indication of progress in your rehabilitation. Be certain to be very alert to any changes in your symptoms and the activities in which you participated in the 24 hours prior to the change. Sharing this information with your clinician will allow him/her to most efficiently treat your condition. These exercises may help you when beginning to rehabilitate your injury. Your symptoms may resolve with or without further involvement from your physician, physical therapist or athletic trainer. While completing these exercises, remember:   Restoring tissue flexibility helps normal motion to return to the joints. This allows healthier, less painful movement and activity.  An effective stretch should be held for at least 30 seconds.  A stretch should never be painful. You should only feel a gentle lengthening or release in the stretched tissue. FLEXION RANGE OF MOTION AND STRETCHING EXERCISES: STRETCH - Flexion, Single Knee to Chest   Lie on a firm bed or floor  with both legs extended in front of you.  Keeping one leg in contact with the floor, bring your opposite knee to your chest. Hold your leg in place by either grabbing behind your thigh or at your knee.  Pull until you feel a gentle stretch in your low back. Hold __________ seconds.  Slowly release your grasp and repeat the exercise with the opposite side. Repeat __________ times. Complete this exercise __________ times per day.  STRETCH - Flexion, Double Knee to Chest  Lie on a firm bed or floor with both legs extended in front of you.  Keeping one leg in contact with the floor, bring your opposite knee to your chest.  Tense your stomach muscles to support your back and then lift your other knee to your chest. Hold your legs in place by either grabbing behind your thighs or at your knees.  Pull both knees toward your chest until you feel a gentle stretch in your low back. Hold __________ seconds.  Tense your stomach muscles and slowly return one leg at a time to the floor. Repeat __________ times. Complete this exercise __________ times per day.  STRETCH - Low Trunk Rotation   Lie on a firm bed or floor. Keeping your legs in front of you, bend your knees so they are both pointed toward the ceiling and your feet are flat on the floor.  Extend your arms out to the side. This will stabilize your upper body by keeping your shoulders in contact with the floor.  Gently and slowly drop both knees together to one side until   you feel a gentle stretch in your low back. Hold for __________ seconds.  Tense your stomach muscles to support your low back as you bring your knees back to the starting position. Repeat the exercise to the other side. Repeat __________ times. Complete this exercise __________ times per day  EXTENSION RANGE OF MOTION AND FLEXIBILITY EXERCISES: STRETCH - Extension, Prone on Elbows  Lie on your stomach on the floor, a bed will be too soft. Place your palms about shoulder  width apart and at the height of your head.  Place your elbows under your shoulders. If this is too painful, stack pillows under your chest.  Allow your body to relax so that your hips drop lower and make contact more completely with the floor.  Hold this position for __________ seconds.  Slowly return to lying flat on the floor. Repeat __________ times. Complete this exercise __________ times per day.  RANGE OF MOTION - Extension, Prone Press Ups  Lie on your stomach on the floor, a bed will be too soft. Place your palms about shoulder width apart and at the height of your head.  Keeping your back as relaxed as possible, slowly straighten your elbows while keeping your hips on the floor. You may adjust the placement of your hands to maximize your comfort. As you gain motion, your hands will come more underneath your shoulders.  Hold this position __________ seconds.  Slowly return to lying flat on the floor. Repeat __________ times. Complete this exercise __________ times per day.  STRENGTHENING EXERCISES - Sciatica  These exercises may help you when beginning to rehabilitate your injury. These exercises should be done near your "sweet spot." This is the neutral, low-back arch, somewhere between fully rounded and fully arched, that is your least painful position. When performed in this safe range of motion, these exercises can be used for people who have either a flexion or extension based injury. These exercises may resolve your symptoms with or without further involvement from your physician, physical therapist or athletic trainer. While completing these exercises, remember:   Muscles can gain both the endurance and the strength needed for everyday activities through controlled exercises.  Complete these exercises as instructed by your physician, physical therapist or athletic trainer. Progress with the resistance and repetition exercises only as your caregiver advises.  You may  experience muscle soreness or fatigue, but the pain or discomfort you are trying to eliminate should never worsen during these exercises. If this pain does worsen, stop and make certain you are following the directions exactly. If the pain is still present after adjustments, discontinue the exercise until you can discuss the trouble with your clinician. STRENGTHENING - Deep Abdominals, Pelvic Tilt   Lie on a firm bed or floor. Keeping your legs in front of you, bend your knees so they are both pointed toward the ceiling and your feet are flat on the floor.  Tense your lower abdominal muscles to press your low back into the floor. This motion will rotate your pelvis so that your tail bone is scooping upwards rather than pointing at your feet or into the floor.  With a gentle tension and even breathing, hold this position for __________ seconds. Repeat __________ times. Complete this exercise __________ times per day.  STRENGTHENING - Abdominals, Crunches   Lie on a firm bed or floor. Keeping your legs in front of you, bend your knees so they are both pointed toward the ceiling and your feet are flat on the   floor. Cross your arms over your chest.  Slightly tip your chin down without bending your neck.  Tense your abdominals and slowly lift your trunk high enough to just clear your shoulder blades. Lifting higher can put excessive stress on the low back and does not further strengthen your abdominal muscles.  Control your return to the starting position. Repeat __________ times. Complete this exercise __________ times per day.  STRENGTHENING - Quadruped, Opposite UE/LE Lift  Assume a hands and knees position on a firm surface. Keep your hands under your shoulders and your knees under your hips. You may place padding under your knees for comfort.  Find your neutral spine and gently tense your abdominal muscles so that you can maintain this position. Your shoulders and hips should form a rectangle  that is parallel with the floor and is not twisted.  Keeping your trunk steady, lift your right hand no higher than your shoulder and then your left leg no higher than your hip. Make sure you are not holding your breath. Hold this position __________ seconds.  Continuing to keep your abdominal muscles tense and your back steady, slowly return to your starting position. Repeat with the opposite arm and leg. Repeat __________ times. Complete this exercise __________ times per day.  STRENGTHENING - Abdominals and Quadriceps, Straight Leg Raise   Lie on a firm bed or floor with both legs extended in front of you.  Keeping one leg in contact with the floor, bend the other knee so that your foot can rest flat on the floor.  Find your neutral spine, and tense your abdominal muscles to maintain your spinal position throughout the exercise.  Slowly lift your straight leg off the floor about 6 inches for a count of 15, making sure to not hold your breath.  Still keeping your neutral spine, slowly lower your leg all the way to the floor. Repeat this exercise with each leg __________ times. Complete this exercise __________ times per day. POSTURE AND BODY MECHANICS CONSIDERATIONS - Sciatica Keeping correct posture when sitting, standing or completing your activities will reduce the stress put on different body tissues, allowing injured tissues a chance to heal and limiting painful experiences. The following are general guidelines for improved posture. Your physician or physical therapist will provide you with any instructions specific to your needs. While reading these guidelines, remember:  The exercises prescribed by your provider will help you have the flexibility and strength to maintain correct postures.  The correct posture provides the optimal environment for your joints to work. All of your joints have less wear and tear when properly supported by a spine with good posture. This means you will  experience a healthier, less painful body.  Correct posture must be practiced with all of your activities, especially prolonged sitting and standing. Correct posture is as important when doing repetitive low-stress activities (typing) as it is when doing a single heavy-load activity (lifting). RESTING POSITIONS Consider which positions are most painful for you when choosing a resting position. If you have pain with flexion-based activities (sitting, bending, stooping, squatting), choose a position that allows you to rest in a less flexed posture. You would want to avoid curling into a fetal position on your side. If your pain worsens with extension-based activities (prolonged standing, working overhead), avoid resting in an extended position such as sleeping on your stomach. Most people will find more comfort when they rest with their spine in a more neutral position, neither too rounded nor too   arched. Lying on a non-sagging bed on your side with a pillow between your knees, or on your back with a pillow under your knees will often provide some relief. Keep in mind, being in any one position for a prolonged period of time, no matter how correct your posture, can still lead to stiffness. PROPER SITTING POSTURE In order to minimize stress and discomfort on your spine, you must sit with correct posture Sitting with good posture should be effortless for a healthy body. Returning to good posture is a gradual process. Many people can work toward this most comfortably by using various supports until they have the flexibility and strength to maintain this posture on their own. When sitting with proper posture, your ears will fall over your shoulders and your shoulders will fall over your hips. You should use the back of the chair to support your upper back. Your low back will be in a neutral position, just slightly arched. You may place a small pillow or folded towel at the base of your low back for support.  When  working at a desk, create an environment that supports good, upright posture. Without extra support, muscles fatigue and lead to excessive strain on joints and other tissues. Keep these recommendations in mind: CHAIR:   A chair should be able to slide under your desk when your back makes contact with the back of the chair. This allows you to work closely.  The chair's height should allow your eyes to be level with the upper part of your monitor and your hands to be slightly lower than your elbows. BODY POSITION  Your feet should make contact with the floor. If this is not possible, use a foot rest.  Keep your ears over your shoulders. This will reduce stress on your neck and low back. INCORRECT SITTING POSTURES   If you are feeling tired and unable to assume a healthy sitting posture, do not slouch or slump. This puts excessive strain on your back tissues, causing more damage and pain. Healthier options include:  Using more support, like a lumbar pillow.  Switching tasks to something that requires you to be upright or walking.  Talking a brief walk.  Lying down to rest in a neutral-spine position. PROLONGED STANDING WHILE SLIGHTLY LEANING FORWARD  When completing a task that requires you to lean forward while standing in one place for a long time, place either foot up on a stationary 2-4 inch high object to help maintain the best posture. When both feet are on the ground, the low back tends to lose its slight inward curve. If this curve flattens (or becomes too large), then the back and your other joints will experience too much stress, fatigue more quickly and can cause pain.  CORRECT STANDING POSTURES Proper standing posture should be assumed with all daily activities, even if they only take a few moments, like when brushing your teeth. As in sitting, your ears should fall over your shoulders and your shoulders should fall over your hips. You should keep a slight tension in your abdominal  muscles to brace your spine. Your tailbone should point down to the ground, not behind your body, resulting in an over-extended swayback posture.  INCORRECT STANDING POSTURES  Common incorrect standing postures include a forward head, locked knees and/or an excessive swayback. WALKING Walk with an upright posture. Your ears, shoulders and hips should all line-up. PROLONGED ACTIVITY IN A FLEXED POSITION When completing a task that requires you to bend forward   at your waist or lean over a low surface, try to find a way to stabilize 3 of 4 of your limbs. You can place a hand or elbow on your thigh or rest a knee on the surface you are reaching across. This will provide you more stability so that your muscles do not fatigue as quickly. By keeping your knees relaxed, or slightly bent, you will also reduce stress across your low back. CORRECT LIFTING TECHNIQUES DO :   Assume a wide stance. This will provide you more stability and the opportunity to get as close as possible to the object which you are lifting.  Tense your abdominals to brace your spine; then bend at the knees and hips. Keeping your back locked in a neutral-spine position, lift using your leg muscles. Lift with your legs, keeping your back straight.  Test the weight of unknown objects before attempting to lift them.  Try to keep your elbows locked down at your sides in order get the best strength from your shoulders when carrying an object.  Always ask for help when lifting heavy or awkward objects. INCORRECT LIFTING TECHNIQUES DO NOT:   Lock your knees when lifting, even if it is a small object.  Bend and twist. Pivot at your feet or move your feet when needing to change directions.  Assume that you cannot safely pick up a paperclip without proper posture.   This information is not intended to replace advice given to you by your health care provider. Make sure you discuss any questions you have with your health care provider.     Document Released: 11/02/2005 Document Revised: 03/19/2015 Document Reviewed: 02/14/2009 [employer] Interactive Patient Education 2016 [employer]  

## 2016-01-23 NOTE — Progress Notes (Signed)
Subjective:    Patient ID: Francisco [name], male    DOB: [birth year], 75 y.o.   MRN: [id number]   Abdominal Pain This is a new problem. The current episode started more than 1 month ago. The onset quality is gradual. The problem occurs constantly. The problem has been unchanged. The pain is located in the RUQ. The pain is at a severity of 8/10. The pain is moderate. The quality of the pain is aching. The abdominal pain does not radiate. Pertinent negatives include no belching, constipation, diarrhea, dysuria, fever, flatus, headaches, hematochezia, hematuria, myalgias, nausea or vomiting. Nothing aggravates the pain. Treatments tried: icy hot. The treatment provided mild relief.  Back Pain This is a chronic problem. The current episode started more than 1 year ago. The problem occurs every several days. The problem is unchanged. The pain is present in the lumbar spine. The quality of the pain is described as aching. The pain radiates to the left thigh. The pain is at a severity of 10/10. The pain is moderate. The symptoms are aggravated by bending and standing. Associated symptoms include abdominal pain, leg pain, numbness and tingling. Pertinent negatives include no dysuria, fever, headaches or weakness. He has tried bed rest, ice and heat for the symptoms. The treatment provided mild relief.      Review of Systems  Constitutional: Negative.  Negative for fever.  HENT: Negative.   Respiratory: Negative.   Cardiovascular: Negative.   Gastrointestinal: Positive for abdominal pain. Negative for nausea, vomiting, diarrhea, constipation, hematochezia and flatus.  Endocrine: Negative.   Genitourinary: Negative.  Negative for dysuria and hematuria.  Musculoskeletal: Positive for back pain. Negative for myalgias.  Neurological: Positive for tingling and numbness. Negative for weakness and headaches.  Hematological: Negative.   Psychiatric/Behavioral: Negative.   All other systems reviewed and are  negative.      Objective:   Physical Exam  Constitutional: He is oriented to person, place, and time. He appears well-developed and well-nourished. No distress.  HENT:  Head: Normocephalic.  Right Ear: External ear normal.  Left Ear: External ear normal.  Mouth/Throat: Oropharynx is clear and moist.  Eyes: Pupils are equal, round, and reactive to light. Right eye exhibits no discharge. Left eye exhibits no discharge.  Neck: Normal range of motion. Neck supple. No thyromegaly present.  Cardiovascular: Normal rate, regular rhythm, normal heart sounds and intact distal pulses.   No murmur heard. Pulmonary/Chest: Effort normal and breath sounds normal. No respiratory distress. He has no wheezes.  Abdominal: Soft. Bowel sounds are normal. He exhibits no distension. There is no tenderness.  Musculoskeletal: Normal range of motion. He exhibits no edema or tenderness.  Neurological: He is alert and oriented to person, place, and time. He has normal reflexes. No cranial nerve deficit.  Skin: Skin is warm and dry. No rash noted. No erythema.  Psychiatric: He has a normal mood and affect. His behavior is normal. Judgment and thought content normal.  Vitals reviewed.   BP 133/71 mmHg  Pulse 68  Temp(Src) 97.4 F (36.3 C) (Oral)  Ht 5\' 9"  (1.753 m)  Wt 208 lb (94.348 kg)  BMI 30.70 kg/m2       Assessment & Plan:  1. RUQ abdominal pain - Urinalysis, Complete - CBC with Differential/Platelet  2. Left-sided low back pain with left-sided sciatica -Rest -Ice and heat as needed -ROM exercises discussed -Continue mobic as needed -RTO prn  - gabapentin (NEURONTIN) 300 MG capsule; Take 1 capsule (300 mg total) by mouth  3 (three) times daily.  Dispense: 90 capsule; Refill: Keeler, FNP

## 2016-01-23 NOTE — Addendum Note (Signed)
Addended by: Evelina Dun A on: 01/23/2016 05:30 PM   Modules accepted: Orders

## 2016-01-24 NOTE — Telephone Encounter (Signed)
Several attempts have been made to contact patient this encounter will be closed.  

## 2016-02-06 ENCOUNTER — Other Ambulatory Visit: Payer: Self-pay | Admitting: Nurse Practitioner

## 2016-02-06 ENCOUNTER — Other Ambulatory Visit: Payer: Medicare Other

## 2016-02-06 DIAGNOSIS — Z1212 Encounter for screening for malignant neoplasm of rectum: Secondary | ICD-10-CM

## 2016-02-09 LAB — FECAL OCCULT BLOOD, IMMUNOCHEMICAL: FECAL OCCULT BLD: NEGATIVE

## 2016-02-14 ENCOUNTER — Other Ambulatory Visit: Payer: Self-pay | Admitting: Nurse Practitioner

## 2016-04-05 ENCOUNTER — Other Ambulatory Visit: Payer: Self-pay | Admitting: Nurse Practitioner

## 2016-04-06 NOTE — Telephone Encounter (Signed)
Last seen 01/23/16 Francisco [name]  MMM [facility]   If approved route to nurse to call into [facility]

## 2016-04-07 NOTE — Telephone Encounter (Signed)
rx called into pharmacy

## 2016-04-07 NOTE — Telephone Encounter (Signed)
Please call in xanax with 1 refills 

## 2016-04-10 ENCOUNTER — Ambulatory Visit: Payer: Self-pay | Admitting: Nurse Practitioner

## 2016-04-18 ENCOUNTER — Other Ambulatory Visit: Payer: Self-pay | Admitting: Nurse Practitioner

## 2016-04-23 ENCOUNTER — Ambulatory Visit (INDEPENDENT_AMBULATORY_CARE_PROVIDER_SITE_OTHER): Payer: Medicare Other | Admitting: Nurse Practitioner

## 2016-04-23 ENCOUNTER — Encounter: Payer: Self-pay | Admitting: Nurse Practitioner

## 2016-04-23 VITALS — BP 115/67 | HR 91 | Temp 98.3°F | Ht 69.0 in | Wt 208.0 lb

## 2016-04-23 DIAGNOSIS — Z6831 Body mass index (BMI) 31.0-31.9, adult: Secondary | ICD-10-CM

## 2016-04-23 DIAGNOSIS — F411 Generalized anxiety disorder: Secondary | ICD-10-CM | POA: Diagnosis not present

## 2016-04-23 DIAGNOSIS — R3911 Hesitancy of micturition: Secondary | ICD-10-CM | POA: Diagnosis not present

## 2016-04-23 DIAGNOSIS — M5442 Lumbago with sciatica, left side: Secondary | ICD-10-CM

## 2016-04-23 DIAGNOSIS — N400 Enlarged prostate without lower urinary tract symptoms: Secondary | ICD-10-CM

## 2016-04-23 DIAGNOSIS — I10 Essential (primary) hypertension: Secondary | ICD-10-CM | POA: Diagnosis not present

## 2016-04-23 DIAGNOSIS — K219 Gastro-esophageal reflux disease without esophagitis: Secondary | ICD-10-CM

## 2016-04-23 DIAGNOSIS — E785 Hyperlipidemia, unspecified: Secondary | ICD-10-CM

## 2016-04-23 MED ORDER — ALPRAZOLAM 1 MG PO TABS: 1.0000 mg | ORAL_TABLET | Freq: Two times a day (BID) | ORAL | Status: DC

## 2016-04-23 MED ORDER — TAMSULOSIN HCL 0.4 MG PO CAPS: 0.4000 mg | ORAL_CAPSULE | Freq: Every day | ORAL | Status: DC

## 2016-04-23 MED ORDER — GABAPENTIN 300 MG PO CAPS: 300.0000 mg | ORAL_CAPSULE | Freq: Three times a day (TID) | ORAL | Status: DC

## 2016-04-23 MED ORDER — SIMVASTATIN 40 MG PO TABS: ORAL_TABLET | ORAL | Status: DC

## 2016-04-23 MED ORDER — FUROSEMIDE 20 MG PO TABS: 20.0000 mg | ORAL_TABLET | Freq: Every day | ORAL | Status: DC

## 2016-04-23 MED ORDER — LOSARTAN POTASSIUM 100 MG PO TABS: 100.0000 mg | ORAL_TABLET | Freq: Every day | ORAL | Status: DC

## 2016-04-23 MED ORDER — ESCITALOPRAM OXALATE 10 MG PO TABS: ORAL_TABLET | ORAL | Status: DC

## 2016-04-23 NOTE — Patient Instructions (Signed)
Stress and Stress Management Stress is a normal reaction to life events. It is what you feel when life demands more than you are used to or more than you can handle. Some stress can be useful. For example, the stress reaction can help you catch the last bus of the day, study for a test, or meet a deadline at work. But stress that occurs too often or for too long can cause problems. It can affect your emotional health and interfere with relationships and normal daily activities. Too much stress can weaken your immune system and increase your risk for physical illness. If you already have a medical problem, stress can make it worse. CAUSES  All sorts of life events may cause stress. An event that causes stress for one person may not be stressful for another person. Major life events commonly cause stress. These may be positive or negative. Examples include losing your job, moving into a new home, getting married, having a baby, or losing a loved one. Less obvious life events may also cause stress, especially if they occur day after day or in combination. Examples include working long hours, driving in traffic, caring for children, being in debt, or being in a difficult relationship. SIGNS AND SYMPTOMS Stress may cause emotional symptoms including, the following:  Anxiety. This is feeling worried, afraid, on edge, overwhelmed, or out of control.  Anger. This is feeling irritated or impatient.  Depression. This is feeling sad, down, helpless, or guilty.  Difficulty focusing, remembering, or making decisions. Stress may cause physical symptoms, including the following:   Aches and pains. These may affect your head, neck, back, stomach, or other areas of your body.  Tight muscles or clenched jaw.  Low energy or trouble sleeping. Stress may cause unhealthy behaviors, including the following:   Eating to feel better (overeating) or skipping meals.  Sleeping too little, too much, or both.  Working  too much or putting off tasks (procrastination).  Smoking, drinking alcohol, or using drugs to feel better. DIAGNOSIS  Stress is diagnosed through an assessment by your health care provider. Your health care provider will ask questions about your symptoms and any stressful life events.Your health care provider will also ask about your medical history and may order blood tests or other tests. Certain medical conditions and medicine can cause physical symptoms similar to stress. Mental illness can cause emotional symptoms and unhealthy behaviors similar to stress. Your health care provider may refer you to a mental health professional for further evaluation.  TREATMENT  Stress management is the recommended treatment for stress.The goals of stress management are reducing stressful life events and coping with stress in healthy ways.  Techniques for reducing stressful life events include the following:  Stress identification. Self-monitor for stress and identify what causes stress for you. These skills may help you to avoid some stressful events.  Time management. Set your priorities, keep a calendar of events, and learn to say "no." These tools can help you avoid making too many commitments. Techniques for coping with stress include the following:  Rethinking the problem. Try to think realistically about stressful events rather than ignoring them or overreacting. Try to find the positives in a stressful situation rather than focusing on the negatives.  Exercise. Physical exercise can release both physical and emotional tension. The key is to find a form of exercise you enjoy and do it regularly.  Relaxation techniques. These relax the body and mind. Examples include yoga, meditation, tai chi, biofeedback, deep  breathing, progressive muscle relaxation, listening to music, being out in nature, journaling, and other hobbies. Again, the key is to find one or more that you enjoy and can do  regularly.  Healthy lifestyle. Eat a balanced diet, get plenty of sleep, and do not smoke. Avoid using alcohol or drugs to relax.  Strong support network. Spend time with family, friends, or other people you enjoy being around.Express your feelings and talk things over with someone you trust. Counseling or talktherapy with a mental health professional may be helpful if you are having difficulty managing stress on your own. Medicine is typically not recommended for the treatment of stress.Talk to your health care provider if you think you need medicine for symptoms of stress. HOME CARE INSTRUCTIONS  Keep all follow-up visits as directed by your health care provider.  Take all medicines as directed by your health care provider. SEEK MEDICAL CARE IF:  Your symptoms get worse or you start having new symptoms.  You feel overwhelmed by your problems and can no longer manage them on your own. SEEK IMMEDIATE MEDICAL CARE IF:  You feel like hurting yourself or someone else.   This information is not intended to replace advice given to you by your health care provider. Make sure you discuss any questions you have with your health care provider.   Document Released: 04/28/2001 Document Revised: 11/23/2014 Document Reviewed: 06/27/2013 Elsevier Interactive Patient Education 2016 [employer]vier Inc.  

## 2016-04-23 NOTE — Addendum Note (Signed)
Addended by: [name] on: 04/23/2016 11:31 AM   Modules accepted: Orders

## 2016-04-23 NOTE — Progress Notes (Signed)
Subjective:     Patient ID: Francisco [name], male   DOB: 02-11-1941, 75 y.o.   MRN: [id number]  Patient here today for follow up of chronic medical problems. NO changes since last visit.  Outpatient Encounter Prescriptions as of 04/23/2016  Medication Sig  . albuterol (PROVENTIL HFA;VENTOLIN HFA) 108 (90 Base) MCG/ACT inhaler Inhale 2 puffs into the lungs every 6 (six) hours as needed for wheezing or shortness of breath.  . ALPRAZolam (XANAX) 1 MG tablet TAKE ONE TABLET BY MOUTH TWICE DAILY  . aspirin 81 MG tablet Take 81 mg by mouth daily.  . Cetirizine HCl (ZYRTEC PO) Take by mouth. Reported on 11/21/2015  . escitalopram (LEXAPRO) 10 MG tablet Take 2 tablets by mouth  once daily  . fish oil-omega-3 fatty acids 1000 MG capsule Take 2 g by mouth daily.  . furosemide (LASIX) 20 MG tablet Take 1 tablet (20 mg total) by mouth daily.  Marland Kitchen gabapentin (NEURONTIN) 300 MG capsule Take 1 capsule (300 mg total) by mouth 3 (three) times daily.  Marland Kitchen losartan (COZAAR) 100 MG tablet Take 1 tablet (100 mg total) by mouth daily.  . meloxicam (MOBIC) 7.5 MG tablet TAKE ONE TABLET BY MOUTH ONCE DAILY  . simvastatin (ZOCOR) 40 MG tablet TAKE ONE TABLET BY MOUTH AT BEDTIME  . tamsulosin (FLOMAX) 0.4 MG CAPS capsule Take 1 capsule (0.4 mg total) by mouth daily after supper.   No facility-administered encounter medications on file as of 04/23/2016.     Hypertension This is a chronic problem. The current episode started more than 1 year ago. The problem is unchanged. The problem is controlled. Pertinent negatives include no chest pain, headaches, neck pain or shortness of breath. Risk factors for coronary artery disease include dyslipidemia, obesity and sedentary lifestyle. The current treatment provides moderate improvement. Compliance problems include diet and exercise.  Hypertensive end-organ damage includes CAD/MI.  Asthma There is no shortness of breath. Pertinent negatives include no chest pain, headaches or sore  throat. His past medical history is significant for asthma.  Gastroesophageal Reflux He reports no chest pain, no nausea or no sore throat.  Hyperlipidemia This is a chronic problem. The current episode started more than 1 year ago. The problem is controlled. Recent lipid tests were reviewed and are normal. Exacerbating diseases include obesity. He has no history of diabetes or hypothyroidism. Pertinent negatives include no chest pain or shortness of breath. Current antihyperlipidemic treatment includes statins. The current treatment provides moderate improvement of lipids. Compliance problems include adherence to diet and adherence to exercise.  Risk factors for coronary artery disease include dyslipidemia, hypertension, male sex and a sedentary lifestyle.  GAD Patient has been anxious and was seen several months ago but seems to be doing better on lexapro. Only needs xanax on a prn basis. GAD 7 : Generalized Anxiety Score 04/23/2016  Nervous, Anxious, on Edge 1  Control/stop worrying 1  Worry too much - different things 1  Trouble relaxing 1  Restless 1  Easily annoyed or irritable 0  Afraid - awful might happen 1  Total GAD 7 Score 6  Anxiety Difficulty Somewhat difficult   BPH Currently on flomax- no trouble voiding as long as takes meds      Review of Systems  Constitutional: Negative.   HENT: Negative.  Negative for sore throat.   Respiratory: Negative for shortness of breath.   Cardiovascular: Negative for chest pain.  Gastrointestinal: Negative for nausea.  Genitourinary: Negative.   Musculoskeletal: Negative for neck pain.  Neurological: Negative for headaches.  Psychiatric/Behavioral: Negative.   All other systems reviewed and are negative.      Objective:   Physical Exam  Constitutional: He is oriented to person, place, and time. He appears well-developed and well-nourished.  HENT:  Head: Normocephalic.  Right Ear: External ear normal.  Left Ear: External ear  normal.  Nose: Nose normal.  Mouth/Throat: Oropharynx is clear and moist.  Eyes: EOM are normal. Pupils are equal, round, and reactive to light.  Neck: Normal range of motion. Neck supple. No thyromegaly present.  Cardiovascular: Normal rate, regular rhythm, normal heart sounds and intact distal pulses.   No murmur heard. Pulmonary/Chest: Effort normal and breath sounds normal. He has no wheezes. He has no rales.  Abdominal: Soft. Bowel sounds are normal.  Genitourinary:  Prostate check referred to urologist  Musculoskeletal: Normal range of motion.  Neurological: He is alert and oriented to person, place, and time.  Skin: Skin is warm and dry.  Psychiatric: He has a normal mood and affect. His behavior is normal. Judgment and thought content normal.   BP 115/67 mmHg  Pulse 91  Temp(Src) 98.3 F (36.8 C) (Oral)  Ht 5' 9" (1.753 m)  Wt 208 lb (94.348 kg)  BMI 30.70 kg/m2        Assessment:  1. Essential hypertension Do not add salt to diet - losartan (COZAAR) 100 MG tablet; Take 1 tablet (100 mg total) by mouth daily.  Dispense: 90 tablet; Refill: 1 - furosemide (LASIX) 20 MG tablet; Take 1 tablet (20 mg total) by mouth daily.  Dispense: 30 tablet; Refill: 3 - CMP14+EGFR  2. Gastroesophageal reflux disease without esophagitis Avoid spicy foods Do not eat 2 hours prior to bedtime  3. BPH (benign prostatic hyperplasia) - tamsulosin (FLOMAX) 0.4 MG CAPS capsule; Take 1 capsule (0.4 mg total) by mouth daily after supper.  Dispense: 90 capsule; Refill: 1  4. GAD (generalized anxiety disorder) Stress management - escitalopram (LEXAPRO) 10 MG tablet; Take 2 tablets by mouth  once daily  Dispense: 60 tablet; Refill: 3 - ALPRAZolam (XANAX) 1 MG tablet; Take 1 tablet (1 mg total) by mouth 2 (two) times daily.  Dispense: 60 tablet; Refill: 1  5. Hyperlipidemia Low fta diet - simvastatin (ZOCOR) 40 MG tablet; TAKE ONE TABLET BY MOUTH AT BEDTIME  Dispense: 90 tablet; Refill: 1 -  Lipid panel  6. BMI 31.0-31.9,adult Discussed diet and exercise for person with BMI >25 Will recheck weight in 3-6 months   7. Urinary hesitancy Keep follow up with urologist - tamsulosin (FLOMAX) 0.4 MG CAPS capsule; Take 1 capsule (0.4 mg total) by mouth daily after supper.  Dispense: 90 capsule; Refill: 1  8. Left-sided low back pain with left-sided sciatica Wears braceon knee for pressure point which helps - gabapentin (NEURONTIN) 300 MG capsule; Take 1 capsule (300 mg total) by mouth 3 (three) times daily.  Dispense: 90 capsule; Refill: 3    Labs pending Health maintenance reviewed Diet and exercise encouraged Continue all meds Follow up  In 3 months   [place], FNP

## 2016-04-24 LAB — CMP14+EGFR
A/G RATIO: 2.5 — AB (ref 1.2–2.2)
ALT: 12 IU/L (ref 0–44)
AST: 14 IU/L (ref 0–40)
Albumin: 4.2 g/dL (ref 3.5–4.8)
Alkaline Phosphatase: 42 IU/L (ref 39–117)
BUN/Creatinine Ratio: 17 (ref 10–24)
BUN: 20 mg/dL (ref 8–27)
Bilirubin Total: 1 mg/dL (ref 0.0–1.2)
CHLORIDE: 103 mmol/L (ref 96–106)
CO2: 24 mmol/L (ref 18–29)
Calcium: 8.7 mg/dL (ref 8.6–10.2)
Creatinine, Ser: 1.18 mg/dL (ref 0.76–1.27)
GFR calc Af Amer: 69 mL/min/{1.73_m2} (ref >59)
GFR calc non Af Amer: 60 mL/min/{1.73_m2} (ref >59)
GLOBULIN, TOTAL: 1.7 g/dL (ref 1.5–4.5)
Glucose: 105 mg/dL — ABNORMAL HIGH (ref 65–99)
POTASSIUM: 4.1 mmol/L (ref 3.5–5.2)
SODIUM: 143 mmol/L (ref 134–144)
Total Protein: 5.9 g/dL — ABNORMAL LOW (ref 6.0–8.5)

## 2016-04-24 LAB — LIPID PANEL
CHOLESTEROL TOTAL: 132 mg/dL (ref 100–199)
Chol/HDL Ratio: 2.9 ratio units (ref 0.0–5.0)
HDL: 45 mg/dL (ref >39)
LDL Calculated: 57 mg/dL (ref 0–99)
TRIGLYCERIDES: 151 mg/dL — AB (ref 0–149)
VLDL Cholesterol Cal: 30 mg/dL (ref 5–40)

## 2016-05-11 ENCOUNTER — Other Ambulatory Visit: Payer: Self-pay | Admitting: Nurse Practitioner

## 2016-06-07 ENCOUNTER — Other Ambulatory Visit: Payer: Self-pay | Admitting: Nurse Practitioner

## 2016-06-07 DIAGNOSIS — F411 Generalized anxiety disorder: Secondary | ICD-10-CM

## 2016-06-08 NOTE — Telephone Encounter (Signed)
Please call in xanax with 1 refills 

## 2016-08-04 ENCOUNTER — Other Ambulatory Visit: Payer: Self-pay | Admitting: Nurse Practitioner

## 2016-08-04 DIAGNOSIS — F411 Generalized anxiety disorder: Secondary | ICD-10-CM

## 2016-08-05 ENCOUNTER — Encounter (HOSPITAL_COMMUNITY)
Admission: EM | Disposition: A | Discharge status: Other Acute Inpatient Hospital | Payer: Self-pay | Source: Home / Self Care | Attending: Emergency Medicine

## 2016-08-05 ENCOUNTER — Emergency Department (HOSPITAL_COMMUNITY)
Admission: EM | Admit: 2016-08-05 | Discharge: 2016-08-05 | Disposition: A | Discharge status: Other Acute Inpatient Hospital | Payer: Medicare Other | Attending: Emergency Medicine | Admitting: Emergency Medicine

## 2016-08-05 ENCOUNTER — Emergency Department (HOSPITAL_COMMUNITY): Payer: Medicare Other

## 2016-08-05 ENCOUNTER — Emergency Department (HOSPITAL_COMMUNITY): Payer: Medicare Other | Admitting: Certified Registered Nurse Anesthetist

## 2016-08-05 ENCOUNTER — Encounter (HOSPITAL_COMMUNITY): Payer: Self-pay

## 2016-08-05 DIAGNOSIS — Z87891 Personal history of nicotine dependence: Secondary | ICD-10-CM | POA: Diagnosis not present

## 2016-08-05 DIAGNOSIS — W312XXA Contact with powered woodworking and forming machines, initial encounter: Secondary | ICD-10-CM | POA: Insufficient documentation

## 2016-08-05 DIAGNOSIS — Y999 Unspecified external cause status: Secondary | ICD-10-CM | POA: Insufficient documentation

## 2016-08-05 DIAGNOSIS — S68611A Complete traumatic transphalangeal amputation of left index finger, initial encounter: Secondary | ICD-10-CM | POA: Diagnosis not present

## 2016-08-05 DIAGNOSIS — S68621A Partial traumatic transphalangeal amputation of left index finger, initial encounter: Secondary | ICD-10-CM | POA: Diagnosis not present

## 2016-08-05 DIAGNOSIS — S6432XA Injury of digital nerve of left thumb, initial encounter: Secondary | ICD-10-CM | POA: Diagnosis not present

## 2016-08-05 DIAGNOSIS — I10 Essential (primary) hypertension: Secondary | ICD-10-CM | POA: Insufficient documentation

## 2016-08-05 DIAGNOSIS — Y929 Unspecified place or not applicable: Secondary | ICD-10-CM | POA: Diagnosis not present

## 2016-08-05 DIAGNOSIS — Y9389 Activity, other specified: Secondary | ICD-10-CM | POA: Diagnosis not present

## 2016-08-05 DIAGNOSIS — S61012A Laceration without foreign body of left thumb without damage to nail, initial encounter: Secondary | ICD-10-CM | POA: Diagnosis not present

## 2016-08-05 DIAGNOSIS — S68119A Complete traumatic metacarpophalangeal amputation of unspecified finger, initial encounter: Secondary | ICD-10-CM

## 2016-08-05 DIAGNOSIS — S6992XA Unspecified injury of left wrist, hand and finger(s), initial encounter: Secondary | ICD-10-CM

## 2016-08-05 DIAGNOSIS — S68121A Partial traumatic metacarpophalangeal amputation of left index finger, initial encounter: Secondary | ICD-10-CM | POA: Diagnosis not present

## 2016-08-05 HISTORY — PX: AMPUTATION: SHX166

## 2016-08-05 LAB — POCT I-STAT 4, (NA,K, GLUC, HGB,HCT)
Glucose, Bld: 104 mg/dL — ABNORMAL HIGH (ref 65–99)
HEMATOCRIT: 37 % — AB (ref 39.0–52.0)
HEMOGLOBIN: 12.6 g/dL — AB (ref 13.0–17.0)
Potassium: 4.1 mmol/L (ref 3.5–5.1)
SODIUM: 140 mmol/L (ref 135–145)

## 2016-08-05 SURGERY — AMPUTATION DIGIT
Anesthesia: General | Site: Hand | Laterality: Left

## 2016-08-05 MED ORDER — CHLORHEXIDINE GLUCONATE 4 % EX LIQD: 60.0000 mL | Freq: Once | CUTANEOUS | Status: DC

## 2016-08-05 MED ORDER — PHENYLEPHRINE HCL 10 MG/ML IJ SOLN
INTRAVENOUS | Status: DC | PRN
  Administered 2016-08-05: 20 ug/min via INTRAVENOUS

## 2016-08-05 MED ORDER — CEFAZOLIN IN D5W 1 GM/50ML IV SOLN
1.0000 g | Freq: Once | INTRAVENOUS | Status: AC
  Administered 2016-08-05: 1 g via INTRAVENOUS
  Filled 2016-08-05: qty 50

## 2016-08-05 MED ORDER — ONDANSETRON HCL 4 MG/2ML IJ SOLN
INTRAMUSCULAR | Status: DC | PRN
  Administered 2016-08-05: 4 mg via INTRAVENOUS

## 2016-08-05 MED ORDER — OXYCODONE-ACETAMINOPHEN 5-325 MG PO TABS: 1.0000 | ORAL_TABLET | Freq: Three times a day (TID) | ORAL | 0 refills | Status: AC

## 2016-08-05 MED ORDER — PROPOFOL 10 MG/ML IV BOLUS
INTRAVENOUS | Status: AC
  Filled 2016-08-05: qty 20

## 2016-08-05 MED ORDER — EPHEDRINE SULFATE 50 MG/ML IJ SOLN
INTRAMUSCULAR | Status: DC | PRN
  Administered 2016-08-05 (×2): 5 mg via INTRAVENOUS

## 2016-08-05 MED ORDER — PROPOFOL 10 MG/ML IV BOLUS
INTRAVENOUS | Status: DC | PRN
  Administered 2016-08-05: 200 mg via INTRAVENOUS
  Administered 2016-08-05: 10 mg via INTRAVENOUS

## 2016-08-05 MED ORDER — LACTATED RINGERS IV SOLN
INTRAVENOUS | Status: DC | PRN
  Administered 2016-08-05: 18:00:00 via INTRAVENOUS

## 2016-08-05 MED ORDER — LIDOCAINE HCL (CARDIAC) 20 MG/ML IV SOLN
INTRAVENOUS | Status: DC | PRN
  Administered 2016-08-05: 60 mg via INTRAVENOUS

## 2016-08-05 MED ORDER — CLINDAMYCIN HCL 300 MG PO CAPS: 300.0000 mg | ORAL_CAPSULE | Freq: Three times a day (TID) | ORAL | 0 refills | Status: DC

## 2016-08-05 MED ORDER — CLINDAMYCIN PHOSPHATE 900 MG/50ML IV SOLN
INTRAVENOUS | Status: AC
  Filled 2016-08-05: qty 50

## 2016-08-05 MED ORDER — PROMETHAZINE HCL 25 MG/ML IJ SOLN: 6.2500 mg | INTRAMUSCULAR | Status: DC | PRN

## 2016-08-05 MED ORDER — LACTATED RINGERS IV SOLN
INTRAVENOUS | Status: DC
  Administered 2016-08-05: 17:00:00 via INTRAVENOUS

## 2016-08-05 MED ORDER — HYDROCODONE-ACETAMINOPHEN 5-325 MG PO TABS
1.0000 | ORAL_TABLET | Freq: Once | ORAL | Status: AC
  Administered 2016-08-05: 1 via ORAL
  Filled 2016-08-05: qty 1

## 2016-08-05 MED ORDER — DOCUSATE SODIUM 100 MG PO CAPS: 100.0000 mg | ORAL_CAPSULE | Freq: Two times a day (BID) | ORAL | 0 refills | Status: DC

## 2016-08-05 MED ORDER — PHENYLEPHRINE HCL 10 MG/ML IJ SOLN
INTRAMUSCULAR | Status: AC
  Filled 2016-08-05: qty 1

## 2016-08-05 MED ORDER — 0.9 % SODIUM CHLORIDE (POUR BTL) OPTIME
TOPICAL | Status: DC | PRN
  Administered 2016-08-05: 1000 mL

## 2016-08-05 MED ORDER — POVIDONE-IODINE 10 % EX SOLN
CUTANEOUS | Status: AC
Start: 2016-08-05 — End: 2016-08-06
  Filled 2016-08-05: qty 118

## 2016-08-05 MED ORDER — HYDROMORPHONE HCL 1 MG/ML IJ SOLN: 0.2500 mg | INTRAMUSCULAR | Status: DC | PRN

## 2016-08-05 MED ORDER — BUPIVACAINE HCL (PF) 0.25 % IJ SOLN
INTRAMUSCULAR | Status: DC | PRN
  Administered 2016-08-05: 4 mL

## 2016-08-05 MED ORDER — FENTANYL CITRATE (PF) 100 MCG/2ML IJ SOLN
INTRAMUSCULAR | Status: AC
  Filled 2016-08-05: qty 2

## 2016-08-05 MED ORDER — ONDANSETRON HCL 4 MG/2ML IJ SOLN
INTRAMUSCULAR | Status: AC
  Filled 2016-08-05: qty 2

## 2016-08-05 MED ORDER — SODIUM CHLORIDE 0.9 % IR SOLN
Status: DC | PRN
Start: 2016-08-05 — End: 2016-08-05

## 2016-08-05 MED ORDER — ROCURONIUM BROMIDE 10 MG/ML (PF) SYRINGE
PREFILLED_SYRINGE | INTRAVENOUS | Status: AC
  Filled 2016-08-05: qty 10

## 2016-08-05 MED ORDER — LIDOCAINE 2% (20 MG/ML) 5 ML SYRINGE
INTRAMUSCULAR | Status: AC
  Filled 2016-08-05: qty 5

## 2016-08-05 MED ORDER — HYDROCODONE-ACETAMINOPHEN 7.5-325 MG PO TABS
1.0000 | ORAL_TABLET | Freq: Once | ORAL | Status: DC | PRN
Start: 2016-08-05 — End: 2016-08-06

## 2016-08-05 MED ORDER — EPHEDRINE 5 MG/ML INJ
INTRAVENOUS | Status: AC
  Filled 2016-08-05: qty 10

## 2016-08-05 MED ORDER — PHENYLEPHRINE HCL 10 MG/ML IJ SOLN
INTRAMUSCULAR | Status: DC | PRN
  Administered 2016-08-05 (×2): 120 ug via INTRAVENOUS
  Administered 2016-08-05 (×2): 80 ug via INTRAVENOUS

## 2016-08-05 MED ORDER — FENTANYL CITRATE (PF) 100 MCG/2ML IJ SOLN
INTRAMUSCULAR | Status: DC | PRN
  Administered 2016-08-05: 50 ug via INTRAVENOUS

## 2016-08-05 MED ORDER — SUCCINYLCHOLINE CHLORIDE 20 MG/ML IJ SOLN
INTRAMUSCULAR | Status: DC | PRN
  Administered 2016-08-05: 120 mg via INTRAVENOUS

## 2016-08-05 MED ORDER — BUPIVACAINE HCL (PF) 0.25 % IJ SOLN
INTRAMUSCULAR | Status: AC
  Filled 2016-08-05: qty 30

## 2016-08-05 MED ORDER — CLINDAMYCIN PHOSPHATE 900 MG/50ML IV SOLN
900.0000 mg | INTRAVENOUS | Status: AC
  Administered 2016-08-05: 900 mg via INTRAVENOUS

## 2016-08-05 MED ORDER — TETANUS-DIPHTH-ACELL PERTUSSIS 5-2.5-18.5 LF-MCG/0.5 IM SUSP
0.5000 mL | Freq: Once | INTRAMUSCULAR | Status: AC
  Administered 2016-08-05: 0.5 mL via INTRAMUSCULAR
  Filled 2016-08-05: qty 0.5

## 2016-08-05 SURGICAL SUPPLY — 52 items
BANDAGE ACE 4X5 VEL STRL LF (GAUZE/BANDAGES/DRESSINGS) IMPLANT
BANDAGE ELASTIC 3 VELCRO ST LF (GAUZE/BANDAGES/DRESSINGS) IMPLANT
BNDG COHESIVE 1X5 TAN STRL LF (GAUZE/BANDAGES/DRESSINGS) ×3 IMPLANT
BNDG CONFORM 2 STRL LF (GAUZE/BANDAGES/DRESSINGS) IMPLANT
BNDG ELASTIC 2 VLCR STRL LF (GAUZE/BANDAGES/DRESSINGS) ×6 IMPLANT
BNDG ELASTIC 2X5.8 VLCR STR LF (GAUZE/BANDAGES/DRESSINGS) ×3 IMPLANT
BNDG ESMARK 4X9 LF (GAUZE/BANDAGES/DRESSINGS) ×3 IMPLANT
BNDG GAUZE ELAST 4 BULKY (GAUZE/BANDAGES/DRESSINGS) ×6 IMPLANT
BRUSH SCRUB EZ PLAIN DRY (MISCELLANEOUS) ×3 IMPLANT
CORDS BIPOLAR (ELECTRODE) ×3 IMPLANT
COVER SURGICAL LIGHT HANDLE (MISCELLANEOUS) ×3 IMPLANT
CUFF TOURNIQUET SINGLE 18IN (TOURNIQUET CUFF) ×3 IMPLANT
DRAPE SURG 17X23 STRL (DRAPES) ×3 IMPLANT
DRSG ADAPTIC 3X8 NADH LF (GAUZE/BANDAGES/DRESSINGS) ×3 IMPLANT
GAUZE SPONGE 2X2 8PLY STRL LF (GAUZE/BANDAGES/DRESSINGS) IMPLANT
GAUZE SPONGE 4X4 12PLY STRL (GAUZE/BANDAGES/DRESSINGS) ×3 IMPLANT
GLOVE BIO SURGEON STRL SZ 6.5 (GLOVE) ×2 IMPLANT
GLOVE BIO SURGEONS STRL SZ 6.5 (GLOVE) ×1
GLOVE BIOGEL PI IND STRL 6.5 (GLOVE) ×2 IMPLANT
GLOVE BIOGEL PI IND STRL 8.5 (GLOVE) ×1 IMPLANT
GLOVE BIOGEL PI INDICATOR 6.5 (GLOVE) ×4
GLOVE BIOGEL PI INDICATOR 8.5 (GLOVE) ×2
GLOVE SURG ORTHO 8.0 STRL STRW (GLOVE) ×3 IMPLANT
GLOVE SURG SS PI 6.5 STRL IVOR (GLOVE) ×3 IMPLANT
GOWN STRL REUS W/ TWL LRG LVL3 (GOWN DISPOSABLE) ×3 IMPLANT
GOWN STRL REUS W/ TWL XL LVL3 (GOWN DISPOSABLE) ×2 IMPLANT
GOWN STRL REUS W/TWL LRG LVL3 (GOWN DISPOSABLE) ×6
GOWN STRL REUS W/TWL XL LVL3 (GOWN DISPOSABLE) ×4
KIT BASIN OR (CUSTOM PROCEDURE TRAY) ×3 IMPLANT
KIT ROOM TURNOVER OR (KITS) ×3 IMPLANT
MANIFOLD NEPTUNE II (INSTRUMENTS) ×3 IMPLANT
NEEDLE HYPO 25GX1X1/2 BEV (NEEDLE) IMPLANT
NS IRRIG 1000ML POUR BTL (IV SOLUTION) ×3 IMPLANT
PACK ORTHO EXTREMITY (CUSTOM PROCEDURE TRAY) ×3 IMPLANT
PAD ARMBOARD 7.5X6 YLW CONV (MISCELLANEOUS) ×6 IMPLANT
PAD CAST 4YDX4 CTTN HI CHSV (CAST SUPPLIES) IMPLANT
PADDING CAST COTTON 4X4 STRL (CAST SUPPLIES)
SET CYSTO W/LG BORE CLAMP LF (SET/KITS/TRAYS/PACK) ×3 IMPLANT
SOAP 2 % CHG 4 OZ (WOUND CARE) ×3 IMPLANT
SPECIMEN JAR SMALL (MISCELLANEOUS) ×3 IMPLANT
SPONGE GAUZE 2X2 STER 10/PKG (GAUZE/BANDAGES/DRESSINGS)
SUCTION FRAZIER HANDLE 10FR (MISCELLANEOUS) ×2
SUCTION TUBE FRAZIER 10FR DISP (MISCELLANEOUS) ×1 IMPLANT
SUT MERSILENE 4 0 P 3 (SUTURE) IMPLANT
SUT PROLENE 4 0 P 3 18 (SUTURE) ×6 IMPLANT
SUT PROLENE 4 0 PS 2 18 (SUTURE) ×3 IMPLANT
SYR CONTROL 10ML LL (SYRINGE) ×3 IMPLANT
TOWEL OR 17X24 6PK STRL BLUE (TOWEL DISPOSABLE) ×3 IMPLANT
TOWEL OR 17X26 10 PK STRL BLUE (TOWEL DISPOSABLE) ×3 IMPLANT
TUBE CONNECTING 12'X1/4 (SUCTIONS) ×1
TUBE CONNECTING 12X1/4 (SUCTIONS) ×2 IMPLANT
WATER STERILE IRR 1000ML POUR (IV SOLUTION) ×3 IMPLANT

## 2016-08-05 NOTE — Transfer of Care (Signed)
Immediate Anesthesia Transfer of Care Note  Patient: Francisco [name]  Procedure(s) Performed: Procedure(s): REVISION AMPUTATION LEFT INDEX FINGER REPAIR LACERATION LEFT THUMB (Left)  Patient Location: PACU  Anesthesia Type:General  Level of Consciousness: awake, alert  and oriented  Airway & Oxygen Therapy: Patient Spontanous Breathing  Post-op Assessment: Report given to RN, Post -op Vital signs reviewed and stable and Patient moving all extremities X 4  Post vital signs: Reviewed and stable  Last Vitals:  Vitals:   08/05/16 1615 08/05/16 1630  BP: 138/80 127/71  Pulse: 63 63  Resp:    Temp:      Last Pain:  Vitals:   08/05/16 1510  TempSrc:   PainSc: 0-No pain         Complications: No apparent anesthesia complications

## 2016-08-05 NOTE — Anesthesia Preprocedure Evaluation (Addendum)
Anesthesia Evaluation  Patient identified by MRN, date of birth, ID band Patient awake    Reviewed: Allergy & Precautions, NPO status , Patient's Chart, lab work & pertinent test results  Airway Mallampati: II  TM Distance: >3 FB Neck ROM: Full    Dental  (+) Dental Advisory Given, Teeth Intact   Pulmonary asthma , former smoker,    breath sounds clear to auscultation       Cardiovascular hypertension, Pt. on medications  Rhythm:Regular Rate:Normal     Neuro/Psych    GI/Hepatic GERD  Medicated,  Endo/Other    Renal/GU      Musculoskeletal   Abdominal   Peds  Hematology   Anesthesia Other Findings   Reproductive/Obstetrics                           Anesthesia Physical Anesthesia Plan  ASA: II  Anesthesia Plan: General   Post-op Pain Management:    Induction: Intravenous  Airway Management Planned: Oral ETT  Additional Equipment:   Intra-op Plan:   Post-operative Plan: Extubation in OR  Informed Consent: I have reviewed the patients History and Physical, chart, labs and discussed the procedure including the risks, benefits and alternatives for the proposed anesthesia with the patient or authorized representative who has indicated his/her understanding and acceptance.   Dental advisory given  Plan Discussed with: CRNA, Anesthesiologist and Surgeon  Anesthesia Plan Comments:        Anesthesia Quick Evaluation

## 2016-08-05 NOTE — ED Notes (Signed)
Gave report to [place], Shelly Bombard in short stay. Pt transported to short stay

## 2016-08-05 NOTE — Discharge Instructions (Signed)
KEEP BANDAGE CLEAN AND DRY CALL OFFICE FOR F/U APPT [phone] IN 8 DAYS ALSO CALL FOR THERAPY APPT IN 8 DAYS [phone] EXT 45 KEEP HAND ELEVATED ABOVE HEART OK TO APPLY ICE TO OPERATIVE AREA CONTACT OFFICE IF ANY WORSENING PAIN OR CONCERNS.

## 2016-08-05 NOTE — ED Notes (Signed)
Wound has been cleaned and wrapped with a non-stick dressing. Bleeding is controlled at this time.

## 2016-08-05 NOTE — ED Triage Notes (Signed)
Pt was working with saw when he cut off completely past knuckle left index finger. Left thumb has laceration as well. Pt reports this happened appox 2 hours ago. Bleeding controlled at this time. Pt denies any blood thinners or diabetic.

## 2016-08-05 NOTE — ED Provider Notes (Signed)
[facility] DEPT Provider Note   CSN: [id number] Arrival date & time: 08/05/16  1138  By signing my name below, I, Francisco [name], attest that this documentation has been prepared under the direction and in the presence of Merrily Pew, MD. Electronically Signed: Rayna [name], ED Scribe. 08/05/16. 12:15 PM.   History   Chief Complaint Chief Complaint  Patient presents with  . Hand Injury    HPI HPI Comments: TORREN [name] is a 75 y.o. male who is right hand dominant presents to the Emergency Department complaining of an amputation to his left index finger which occurred 2 hours PTA. Pt was working with a table saw and cut off the affected finger and denies they have been able to find the finger. Pt also reports a laceration with controlled bleeding to his left thumb. Pt takes aspirin regularly but denies being on anticoagulants. He denies any other injuries or regions of pain.   The history is provided by the patient. No language interpreter was used.    Past Medical History:  Diagnosis Date  . Anxiety   . GERD (gastroesophageal reflux disease)   . Hypertension     Patient Active Problem List   Diagnosis Date Noted  . BMI 31.0-31.9,adult 09/06/2015  . BPH (benign prostatic hyperplasia) 08/06/2015  . Urinary hesitancy 04/04/2015  . Asthma, chronic 04/04/2015  . Hypertension 04/12/2013  . GAD (generalized anxiety disorder) 04/12/2013  . GERD (gastroesophageal reflux disease) 04/12/2013  . Hyperlipidemia 04/12/2013    Past Surgical History:  Procedure Laterality Date  . SPINE SURGERY         Home Medications    Prior to Admission medications   Medication Sig Start Date End Date Taking? Authorizing Provider  albuterol (PROVENTIL HFA;VENTOLIN HFA) 108 (90 Base) MCG/ACT inhaler Inhale 2 puffs into the lungs every 6 (six) hours as needed for wheezing or shortness of breath. 11/21/15  Yes Sharion Balloon, FNP  ALPRAZolam Duanne Moron) 1 MG tablet TAKE ONE TABLET BY MOUTH  TWICE DAILY 06/08/16  Yes [name], FNP  aspirin 81 MG tablet Take 81 mg by mouth daily.   Yes Historical Provider, MD  Cetirizine HCl (ZYRTEC PO) Take by mouth. Reported on 11/21/2015   Yes Historical Provider, MD  escitalopram (LEXAPRO) 10 MG tablet Take 2 tablets by mouth  once daily 04/23/16  Yes [name], FNP  fish oil-omega-3 fatty acids 1000 MG capsule Take 2 g by mouth daily.   Yes Historical Provider, MD  furosemide (LASIX) 20 MG tablet Take 1 tablet (20 mg total) by mouth daily. 04/23/16  Yes [name], FNP  losartan (COZAAR) 100 MG tablet Take 1 tablet (100 mg total) by mouth daily. 04/23/16  Yes [name], FNP  meloxicam (MOBIC) 7.5 MG tablet TAKE ONE TABLET BY MOUTH ONCE DAILY 04/20/16  Yes [name], FNP  meloxicam (MOBIC) 7.5 MG tablet TAKE ONE TABLET BY MOUTH ONCE DAILY 05/11/16  Yes [name], FNP  simvastatin (ZOCOR) 40 MG tablet TAKE ONE TABLET BY MOUTH AT BEDTIME 04/23/16  Yes [name], FNP  tamsulosin (FLOMAX) 0.4 MG CAPS capsule Take 1 capsule (0.4 mg total) by mouth daily after supper. 04/23/16  Yes [name], FNP  gabapentin (NEURONTIN) 300 MG capsule Take 1 capsule (300 mg total) by mouth 3 (three) times daily. Patient not taking: Reported on 08/05/2016 04/23/16   [name], FNP    Family History Family History  Problem Relation Age of Onset  . Healthy Mother   . Healthy Father  Social History Social History  Substance Use Topics  . Smoking status: Former Smoker    Quit date: 02/23/1972  . Smokeless tobacco: Never Used  . Alcohol use 0.5 oz/week    1 Standard drinks or equivalent per week     Allergies   Crestor [rosuvastatin]; Lipitor [atorvastatin]; Penicillins; and Symbicort [budesonide-formoterol fumarate]   Review of Systems Review of Systems  Musculoskeletal: Positive for arthralgias and joint swelling.  Skin: Positive for wound.  All other systems reviewed and  are negative.  Physical Exam Updated Vital Signs BP 145/85 (BP Location: Right Arm)   Pulse 65   Temp 97.9 F (36.6 C) (Oral)   Resp 16   Ht 5\' 9"  (1.753 m)   Wt 210 lb (95.3 kg)   SpO2 98%   BMI 31.01 kg/m   Physical Exam  Constitutional: He is oriented to person, place, and time. He appears well-developed and well-nourished.  HENT:  Head: Normocephalic and atraumatic.  Eyes: EOM are normal.  Neck: Normal range of motion.  Cardiovascular: Normal rate.   Pulmonary/Chest: Effort normal. No respiratory distress.  Abdominal: Soft.  Musculoskeletal: Normal range of motion.  Neurological: He is alert and oriented to person, place, and time.  Skin: Skin is warm and dry. Laceration noted.  Midline of proximal phalanx of left index finger is distally amputated; bleeding intact; 3 cm laceration over the volar surface of the left thumb; bleeding intact; good strength in the left thumb; good sensation and perfusion distally  Psychiatric: He has a normal mood and affect.  Nursing note and vitals reviewed.        ED Treatments / Results  Labs (all labs ordered are listed, but only abnormal results are displayed) Labs Reviewed - No data to display  EKG  EKG Interpretation None       Radiology Dg Hand Complete Left  Result Date: 08/05/2016 CLINICAL DATA:  Amputation index finger with a table saw, thumb laceration distally EXAM: LEFT HAND - COMPLETE 3+ VIEW COMPARISON:  None FINDINGS: Mild osseous demineralization. Joint spaces preserved. Amputation of LEFT index single through the mid to distal aspect of the proximal phalanx. Soft tissue laceration with question tiny radiopaque foreign body versus calcification at the wound. No additional fracture, dislocation or bone destruction. 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 Laceration at distal phalanx LEFT thumb with tiny radiopaque foreign body versus calcific density at the wound. Electronically  Signed   By: Lavonia Dana M.D.   On: 08/05/2016 12:43    Procedures Procedures  DIAGNOSTIC STUDIES: Oxygen Saturation is 99% on RA, normal by my interpretation.    COORDINATION OF CARE: 12:14 PM Discussed next steps with pt. Pt verbalized understanding and is agreeable with the plan.    Medications Ordered in ED Medications  povidone-iodine (BETADINE) 10 % external solution (not administered)  Tdap (BOOSTRIX) injection 0.5 mL (0.5 mLs Intramuscular Given 08/05/16 1232)  HYDROcodone-acetaminophen (NORCO/VICODIN) 5-325 MG per tablet 1 tablet (1 tablet Oral Given 08/05/16 1233)  ceFAZolin (ANCEF) IVPB 1 g/50 mL premix (0 g Intravenous Stopped 08/05/16 1332)     Initial Impression / Assessment and Plan / ED Course  I have reviewed the triage vital signs and the nursing notes.  Pertinent labs & imaging results that were available during my care of the patient were reviewed by me and considered in my medical decision making (see chart for details).  Clinical Course    14-year-old male who cut his finger off with a table  saw. Will need to be seen by hand surgery, discussed with Dr. Apolonio Schneiders at [facility] who asked [place] to send the patient down there for evaluation and likely operative amputation revision. He is stable so we will leave his IV intact and sent him POV down to [facility], I guarded discussed the case with Dr. Leonette Monarch, who also accepted the patient to the emergency department.      Final Clinical Impressions(s) / ED Diagnoses   Final diagnoses:  Finger injury, left, initial encounter  Finger amputation, traumatic, initial encounter    New Prescriptions New Prescriptions   No medications on file   I personally performed the services described in this documentation, which was scribed in my presence. The recorded information has been reviewed and is accurate.      Merrily Pew, MD 08/05/16 1538

## 2016-08-05 NOTE — ED Notes (Signed)
X-ray at bedside

## 2016-08-05 NOTE — Anesthesia Procedure Notes (Signed)
Procedure Name: Intubation Date/Time: 08/05/2016 5:53 PM Performed by: Garrison [place] T Pre-anesthesia Checklist: Patient identified, Emergency Drugs available, Suction available and Patient being monitored Patient Re-evaluated:Patient Re-evaluated prior to inductionOxygen Delivery Method: Circle System Utilized Preoxygenation: Pre-oxygenation with 100% oxygen Intubation Type: IV induction and Rapid sequence Ventilation: Mask ventilation without difficulty Laryngoscope Size: Miller and 2 Grade View: Grade II Tube type: Oral Tube size: 7.5 mm Number of attempts: 1 Airway Equipment and Method: Stylet and Oral airway Placement Confirmation: ETT inserted through vocal cords under direct vision,  positive ETCO2 and breath sounds checked- equal and bilateral Secured at: 22 cm Tube secured with: Tape Dental Injury: Teeth and Oropharynx as per pre-operative assessment

## 2016-08-05 NOTE — ED Notes (Signed)
Area cleaned. Bleeding controlled at this time.

## 2016-08-05 NOTE — ED Triage Notes (Signed)
Pt arrives via POV from [facility] with left 2nd digit amputation from table saw this AM. Was told to come here for surgery. Pt with VSS, denies pain, dressing clean dry intact.

## 2016-08-05 NOTE — Anesthesia Postprocedure Evaluation (Signed)
Anesthesia Post Note  Patient: Francisco [name]  Procedure(s) Performed: Procedure(s) (LRB): REVISION AMPUTATION LEFT INDEX FINGER REPAIR LACERATION LEFT THUMB (Left)  Patient location during evaluation: PACU Anesthesia Type: General Level of consciousness: awake Pain management: pain level controlled Vital Signs Assessment: post-procedure vital signs reviewed and stable Respiratory status: spontaneous breathing Cardiovascular status: stable Postop Assessment: no signs of nausea or vomiting Anesthetic complications: no    Last Vitals:  Vitals:   08/05/16 1930 08/05/16 1945  BP: 138/86 132/79  Pulse: 71 70  Resp: 11 12  Temp:      Last Pain:  Vitals:   08/05/16 1930  TempSrc:   PainSc: 0-No pain                 Tuan Tippin

## 2016-08-05 NOTE — ED Notes (Signed)
Md notified that pt is in room.

## 2016-08-05 NOTE — ED Provider Notes (Signed)
Pt transferred here from [facility] to see hand because of a traumatic amputation.  Dr. Caralyn Guile notified that pt has arrived.   Isla Pence, MD 08/05/16 1537

## 2016-08-05 NOTE — H&P (Signed)
Francisco [name] is an 75 y.o. male.   Chief Complaint: left hand table saw injury HPI: Francisco [name] is a 75 y.o. male who is right hand dominant presents to the Emergency Department complaining of an amputation to his left index finger which occurred 7 hours PTA. Pt was working with a table saw and cut off the affected finger and denies they have been able to find the finger. Pt also reports a laceration with controlled bleeding to his left thumb. Pt takes aspirin regularly but denies being on anticoagulants. He denies any other injuries or regions of pain. Pt sent down from [facility]  Past Medical History:  Diagnosis Date  . Anxiety   . GERD (gastroesophageal reflux disease)   . Hypertension     Past Surgical History:  Procedure Laterality Date  . SPINE SURGERY      Family History  Problem Relation Age of Onset  . Healthy Mother   . Healthy Father    Social History:  reports that he quit smoking about 44 years ago. He has never used smokeless tobacco. He reports that he drinks about 0.5 oz of alcohol per week . He reports that he does not use drugs.  Allergies:  Allergies  Allergen Reactions  . Crestor [Rosuvastatin]   . Lipitor [Atorvastatin]   . Penicillins     REACTION: swelling/hives Has patient had a PCN reaction causing immediate rash, facial/tongue/throat swelling, SOB or lightheadedness with hypotension:yes Has patient had a PCN reaction causing severe rash involving mucus membranes or skin necrosis: Yes Has patient had a PCN reaction that required hospitalization No Has patient had a PCN reaction occurring within the last 10 years: No If all of the above answers are "NO", then may proceed with Cephalosporin use.   . Symbicort [Budesonide-Formoterol Fumarate]     Pain  Behind ribs    Medications Prior to Admission  Medication Sig Dispense Refill  . albuterol (PROVENTIL HFA;VENTOLIN HFA) 108 (90 Base) MCG/ACT inhaler Inhale 2 puffs into the lungs every 6  (six) hours as needed for wheezing or shortness of breath. 1 Inhaler 2  . ALPRAZolam (XANAX) 1 MG tablet TAKE ONE TABLET BY MOUTH TWICE DAILY 60 tablet 1  . aspirin 81 MG tablet Take 81 mg by mouth daily.    . Cetirizine HCl (ZYRTEC PO) Take by mouth. Reported on 11/21/2015    . escitalopram (LEXAPRO) 10 MG tablet Take 2 tablets by mouth  once daily 60 tablet 3  . fish oil-omega-3 fatty acids 1000 MG capsule Take 2 g by mouth daily.    . furosemide (LASIX) 20 MG tablet Take 1 tablet (20 mg total) by mouth daily. 30 tablet 3  . losartan (COZAAR) 100 MG tablet Take 1 tablet (100 mg total) by mouth daily. 90 tablet 1  . meloxicam (MOBIC) 7.5 MG tablet TAKE ONE TABLET BY MOUTH ONCE DAILY 30 tablet 0  . meloxicam (MOBIC) 7.5 MG tablet TAKE ONE TABLET BY MOUTH ONCE DAILY 30 tablet 2  . simvastatin (ZOCOR) 40 MG tablet TAKE ONE TABLET BY MOUTH AT BEDTIME 90 tablet 1  . tamsulosin (FLOMAX) 0.4 MG CAPS capsule Take 1 capsule (0.4 mg total) by mouth daily after supper. 90 capsule 1  . gabapentin (NEURONTIN) 300 MG capsule Take 1 capsule (300 mg total) by mouth 3 (three) times daily. (Patient not taking: Reported on 08/05/2016) 90 capsule 3    Results for orders placed or performed during the hospital encounter of 08/05/16 (from the  past 48 hour(s))  I-STAT 4, (NA,K, GLUC, HGB,HCT)     Status: Abnormal   Collection Time: 08/05/16  5:21 PM  Result Value Ref Range   Sodium 140 135 - 145 mmol/L   Potassium 4.1 3.5 - 5.1 mmol/L   Glucose, Bld 104 (H) 65 - 99 mg/dL   HCT 37.0 (L) 39.0 - 52.0 %   Hemoglobin 12.6 (L) 13.0 - 17.0 g/dL   Dg Hand Complete Left  Result Date: 08/05/2016 CLINICAL DATA:  Amputation index finger with a table saw, thumb laceration distally EXAM: LEFT HAND - COMPLETE 3+ VIEW COMPARISON:  None FINDINGS: Mild osseous demineralization. Joint spaces preserved. Amputation of LEFT index single through the mid to distal aspect of the proximal phalanx. Soft tissue laceration with question tiny  radiopaque foreign body versus calcification at the wound. No additional fracture, dislocation or bone destruction. 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 Laceration at distal phalanx LEFT thumb with tiny radiopaque foreign body versus calcific density at the wound. Electronically Signed   By: Lavonia Dana M.D.   On: 08/05/2016 12:43    ROS:NO RECENT ILLNESSES OR HOSPITALIZATIONS  Blood pressure 127/71, pulse 63, temperature 97.9 F (36.6 C), temperature source Oral, resp. rate 16, height 5\' 9"  (1.753 m), weight 210 lb (95.3 kg), SpO2 96 %. Physical Exam  General Appearance:  Alert, cooperative, no distress, appears stated age  Head:  Normocephalic, without obvious abnormality, atraumatic  Eyes:  Pupils equal, conjunctiva/corneas clear,         Throat: Lips, mucosa, and tongue normal; teeth and gums normal  Neck: No visible masses     Lungs:   respirations unlabored  Chest Wall:  No tenderness or deformity  Heart:  Regular rate and rhythm,  Abdomen:   Soft, non-tender,         Extremities: LEFT HAND IN BANDAGE GOOD MOTION TO LONG/RING/SMALL THOSE FINGERS WARM WELL PERFUSED WIGGLES THUMB IN DRESSING  Pulses: 2+ and symmetric  Skin: Skin color, texture, turgor normal, no rashes or lesions     Neurologic: Normal    Assessment/Plan LEFT INDEX FINGER AMPUTATION AT PROXIMAL PHALANX LEFT THUMB LACERATION WITH POSSIBLE TENDON AND NERVE INJURY  LEFT INDEX FINGER REVISION AMPUTATION, PT DOES NOT WANT RAY AMPUTATION LEFT THUMB WOUND EXPLORATION AND REPAIR AS INDICATED  R/B/A DISCUSSED WITH PT IN HOSPITAL  PT VOICED UNDERSTANDING OF PLAN CONSENT SIGNED DAY OF SURGERY PT SEEN AND EXAMINED PRIOR TO OPERATIVE PROCEDURE/DAY OF SURGERY SITE MARKED. QUESTIONS ANSWERED WILL GO HOME FOLLOWING SURGERY  WE ARE PLANNING SURGERY FOR YOUR UPPER EXTREMITY. THE RISKS AND BENEFITS OF SURGERY INCLUDE BUT NOT LIMITED TO BLEEDING INFECTION, DAMAGE TO NEARBY NERVES  ARTERIES TENDONS, FAILURE OF SURGERY TO ACCOMPLISH ITS INTENDED GOALS, PERSISTENT SYMPTOMS AND NEED FOR FURTHER SURGICAL INTERVENTION. WITH THIS IN MIND WE WILL PROCEED. I HAVE DISCUSSED WITH THE PATIENT THE PRE AND POSTOPERATIVE REGIMEN AND THE DOS AND DON'TS. PT VOICED UNDERSTANDING AND INFORMED CONSENT SIGNED.  Linna Hoff 08/05/2016, 5:39 PM

## 2016-08-06 ENCOUNTER — Encounter (HOSPITAL_COMMUNITY): Payer: Self-pay | Admitting: Orthopedic Surgery

## 2016-08-06 NOTE — Telephone Encounter (Signed)
Please call in alprazolam with 1 refills 

## 2016-08-06 NOTE — Op Note (Signed)
NAMEPRESCOTT, MCCONNELL                ACCOUNT NO.:  [id number]  MEDICAL RECORD NO.:  [id number]  LOCATION:  [facility]                         FACILITY:  [facility]  PHYSICIAN:  Linna Hoff IV, M.D.DATE OF BIRTH:  1940-11-19  DATE OF PROCEDURE:  08/05/2016 DATE OF DISCHARGE:                              OPERATIVE REPORT   PREOPERATIVE DIAGNOSES: 1. Left index finger traumatic amputation from a table saw to the     level of the proximal phalanx. 2. Left thumb laceration without tendon involvement.  POSTOPERATIVE DIAGNOSES: 1. Left index finger traumatic amputation from a table saw to the     level of the proximal phalanx. 2. Left thumb laceration without tendon involvement.  ATTENDING PHYSICIAN:  Linna Hoff, M.D., who scrubbed and present for the entire procedure.  ASSISTANT SURGEON:  None.  ANESTHESIA:  General via LMA.  SURGICAL PROCEDURES: 1. Left index finger revision of amputation to the level of the     proximal phalanx with local neurectomies and advancement flap     closure. 2. Left thumb traumatic laceration repair, 3 cm. 3. Left thumb radial and ulnar digital nerve neuroplasties and     exploration.  SURGICAL INDICATION:  Mr. Cobas is a 75 year old gentleman, who was working at home and sustained an injury to his index and thumb on a table saw.  The patient was seen and evaluated in the ER.  Recommended to undergo the above procedures.  Risks, benefits, and alternatives were discussed in detail with the patient, and signed informed consent was obtained.  Risks include, but not limited to bleeding, infection, damage to nearby nerves, arteries, or tendons; loss of motion of the wrist and digits, incomplete relief of symptoms, and need for further surgical intervention.  DESCRIPTION OF PROCEDURE:  The patient was properly identified in the preoperative holding area, marked with a permanent marker on left index and thumb to indicate the correct operative site.  The  patient was brought back to the operating room, placed supine on the anesthesia room table, and general anesthesia was administered.  The patient tolerated this well.  A well-padded tourniquet was placed on the left brachium and sealed with 1000 drape.  Left upper extremity was then prepped and draped in a normal sterile fashion.  Time-out was called, correct side was identified, and procedure then begun.  Attention was then turned to the index finger, where the bone was then taken back to allow for soft tissue coverage.  This was done with a bone cutter and smoothed out with a rongeur.  Amputation to the level of the proximal phalanx.  The patient did not want ray amputation, which was recommended.  Local neurectomies then carried out of the digital nerves, and the skin flaps were then rotated and advanced from a dorsal-to-volar direction to cover the defect.  The wound was thoroughly irrigated.  The wound was then closed with simple Prolene sutures.  Attention was then turned to the thumb.  The 3 cm laceration was then extended proximally and distally exposing the flexor sheath.  The flexor sheath was in continuity. Careful neuroplasty was then done of the radial and ulnar neurovascular bundles and these were in  continuity.  Careful neuroplasty was then carried out dissecting out the nerves.  The wound was then thoroughly irrigated.  After copious wound irrigation, the traumatic laceration was then closed with 4-0 Prolene sutures, Adaptic dressings, sterile compressive bandages were then applied.  The patient tolerated the procedure well, returned to the recovery room in good condition.  POSTPROCEDURE PLAN:  The patient was discharged to home.  Seen back in my office in approximately 2 weeks for wound check and possible suture removal to the index and thumb and then gradual use and activity. Radiographs of the finger and the hand at the first visit.  The patient will allow the wound to  heal of the index finger before we consider potential further intervention in the form of ray amputation and allow the soft tissue to clear itself and see how does in healing.     Melrose Nakayama, M.D.     FWO/MEDQ  D:  08/05/2016  T:  08/06/2016  Job:  [id number]

## 2016-08-24 DIAGNOSIS — S68621D Partial traumatic transphalangeal amputation of left index finger, subsequent encounter: Secondary | ICD-10-CM | POA: Diagnosis not present

## 2016-08-26 ENCOUNTER — Other Ambulatory Visit: Payer: Self-pay | Admitting: Nurse Practitioner

## 2016-09-10 ENCOUNTER — Ambulatory Visit (INDEPENDENT_AMBULATORY_CARE_PROVIDER_SITE_OTHER): Payer: Medicare Other | Admitting: Nurse Practitioner

## 2016-09-10 ENCOUNTER — Encounter (INDEPENDENT_AMBULATORY_CARE_PROVIDER_SITE_OTHER): Payer: Self-pay

## 2016-09-10 ENCOUNTER — Encounter: Payer: Self-pay | Admitting: Nurse Practitioner

## 2016-09-10 VITALS — BP 128/72 | HR 56 | Temp 97.9°F | Ht 69.0 in | Wt 203.0 lb

## 2016-09-10 DIAGNOSIS — F411 Generalized anxiety disorder: Secondary | ICD-10-CM | POA: Diagnosis not present

## 2016-09-10 DIAGNOSIS — J45909 Unspecified asthma, uncomplicated: Secondary | ICD-10-CM | POA: Diagnosis not present

## 2016-09-10 DIAGNOSIS — I10 Essential (primary) hypertension: Secondary | ICD-10-CM | POA: Diagnosis not present

## 2016-09-10 DIAGNOSIS — Z23 Encounter for immunization: Secondary | ICD-10-CM

## 2016-09-10 DIAGNOSIS — R3911 Hesitancy of micturition: Secondary | ICD-10-CM

## 2016-09-10 DIAGNOSIS — K219 Gastro-esophageal reflux disease without esophagitis: Secondary | ICD-10-CM | POA: Diagnosis not present

## 2016-09-10 DIAGNOSIS — E785 Hyperlipidemia, unspecified: Secondary | ICD-10-CM

## 2016-09-10 DIAGNOSIS — M5442 Lumbago with sciatica, left side: Secondary | ICD-10-CM

## 2016-09-10 DIAGNOSIS — N400 Enlarged prostate without lower urinary tract symptoms: Secondary | ICD-10-CM

## 2016-09-10 MED ORDER — FUROSEMIDE 20 MG PO TABS: 20.0000 mg | ORAL_TABLET | Freq: Every day | ORAL | 3 refills | Status: DC

## 2016-09-10 MED ORDER — ESCITALOPRAM OXALATE 10 MG PO TABS: ORAL_TABLET | ORAL | 3 refills | Status: DC

## 2016-09-10 MED ORDER — TAMSULOSIN HCL 0.4 MG PO CAPS: 0.4000 mg | ORAL_CAPSULE | Freq: Every day | ORAL | 1 refills | Status: DC

## 2016-09-10 MED ORDER — GABAPENTIN 300 MG PO CAPS: 300.0000 mg | ORAL_CAPSULE | Freq: Three times a day (TID) | ORAL | 3 refills | Status: DC

## 2016-09-10 MED ORDER — SIMVASTATIN 40 MG PO TABS: ORAL_TABLET | ORAL | 1 refills | Status: DC

## 2016-09-10 MED ORDER — ALPRAZOLAM 1 MG PO TABS: 1.0000 mg | ORAL_TABLET | Freq: Two times a day (BID) | ORAL | 1 refills | Status: DC | PRN

## 2016-09-10 NOTE — Progress Notes (Signed)
Subjective:     Patient ID: Francisco [name], male   DOB: 22-Apr-1941, 75 y.o.   MRN: [id number]  Patient here today for follow up of chronic medical problems. The change since he last visit is that the patient cut off his left index finger. Also, he has stopped taking his blood pressure medication as his blood pressures were running low with a systolic in the 74Y and he was feeling lightheaded. He has continued to check his BP since and states it has not gone back up.    Outpatient Encounter Prescriptions as of 09/10/2016  Medication Sig  . ALPRAZolam (XANAX) 1 MG tablet TAKE ONE TABLET BY MOUTH TWICE DAILY AS NEEDED  . aspirin 81 MG tablet Take 81 mg by mouth daily.  . Cetirizine HCl (ZYRTEC PO) Take by mouth. Reported on 11/21/2015  . clindamycin (CLEOCIN) 300 MG capsule Take 1 capsule (300 mg total) by mouth 3 (three) times daily.  Marland Kitchen docusate sodium (COLACE) 100 MG capsule Take 1 capsule (100 mg total) by mouth 2 (two) times daily.  Marland Kitchen escitalopram (LEXAPRO) 10 MG tablet Take 2 tablets by mouth  once daily  . fish oil-omega-3 fatty acids 1000 MG capsule Take 2 g by mouth daily.  . furosemide (LASIX) 20 MG tablet Take 1 tablet (20 mg total) by mouth daily.  Marland Kitchen gabapentin (NEURONTIN) 300 MG capsule Take 1 capsule (300 mg total) by mouth 3 (three) times daily.  . meloxicam (MOBIC) 7.5 MG tablet TAKE ONE TABLET BY MOUTH ONCE DAILY  . simvastatin (ZOCOR) 40 MG tablet TAKE ONE TABLET BY MOUTH AT BEDTIME  . tamsulosin (FLOMAX) 0.4 MG CAPS capsule Take 1 capsule (0.4 mg total) by mouth daily after supper.  . losartan (COZAAR) 100 MG tablet Take 1 tablet (100 mg total) by mouth daily. (Patient not taking: Reported on 09/10/2016)  . [DISCONTINUED] albuterol (PROVENTIL HFA;VENTOLIN HFA) 108 (90 Base) MCG/ACT inhaler Inhale 2 puffs into the lungs every 6 (six) hours as needed for wheezing or shortness of breath.  . [DISCONTINUED] meloxicam (MOBIC) 7.5 MG tablet TAKE ONE TABLET BY MOUTH ONCE DAILY   No  facility-administered encounter medications on file as of 09/10/2016.      Hypertension  This is a chronic problem. The current episode started more than 1 year ago. The problem is unchanged. The problem is controlled. Pertinent negatives include no chest pain, headaches, neck pain or shortness of breath. Risk factors for coronary artery disease include dyslipidemia, obesity, sedentary lifestyle and male gender. The current treatment provides moderate improvement. Compliance problems include diet and exercise.  Hypertensive end-organ damage includes CAD/MI.  Asthma  There is no shortness of breath. This is a chronic problem. The current episode started more than 1 year ago. The problem occurs rarely. Pertinent negatives include no chest pain, headaches or sore throat. His past medical history is significant for asthma.  Gastroesophageal Reflux  He reports no chest pain, no nausea or no sore throat. This is a chronic problem. The current episode started more than 1 year ago. Risk factors include hiatal hernia and obesity.  Hyperlipidemia  This is a chronic problem. The current episode started more than 1 year ago. The problem is controlled. Recent lipid tests were reviewed and are normal. Exacerbating diseases include obesity. He has no history of diabetes or hypothyroidism. Pertinent negatives include no chest pain or shortness of breath. Current antihyperlipidemic treatment includes statins. The current treatment provides moderate improvement of lipids. Compliance problems include adherence to diet and adherence  to exercise.  Risk factors for coronary artery disease include dyslipidemia, hypertension, male sex and a sedentary lifestyle.  GAD Patient states he does well most of the time. Takes Xanax BID. Can tell if he doesn't take it.  BPH Currently on flomax- no difficulty voiding during the day but does have trouble getting started during the night. GERD Controlled - takes TUMS if  needed.  Review of Systems  Constitutional: Negative.   HENT: Negative.  Negative for sore throat.   Respiratory: Negative for shortness of breath.   Cardiovascular: Negative for chest pain.  Gastrointestinal: Negative for nausea.  Genitourinary: Positive for difficulty urinating (during the night).  Musculoskeletal: Negative for neck pain.  Neurological: Negative for headaches.  Psychiatric/Behavioral: Negative.   All other systems reviewed and are negative.      Objective:   Physical Exam  Constitutional: He is oriented to person, place, and time. He appears well-developed and well-nourished.  HENT:  Head: Normocephalic.  Right Ear: External ear normal.  Left Ear: External ear normal.  Nose: Nose normal.  Mouth/Throat: Oropharynx is clear and moist.  Eyes: Conjunctivae and EOM are normal. Pupils are equal, round, and reactive to light.  Neck: Normal range of motion. Neck supple. No thyromegaly present.  Cardiovascular: Normal rate, regular rhythm, normal heart sounds and intact distal pulses.   No murmur heard. Pulmonary/Chest: Effort normal and breath sounds normal. He has no wheezes. He has no rales.  Abdominal: Soft. Bowel sounds are normal.  Musculoskeletal: Normal range of motion.  Left pointer finger amputated.  Neurological: He is alert and oriented to person, place, and time.  Skin: Skin is warm and dry.  Psychiatric: He has a normal mood and affect. His behavior is normal. Judgment and thought content normal.   BP 128/72   Pulse (!) 56   Temp 97.9 F (36.6 C) (Oral)   Ht 5' 9"  (1.753 m)   Wt 203 lb (92.1 kg)   BMI 29.98 kg/m      Assessment:  1. Essential hypertension Education provided about the DASH diet. - CMP14+EGFR - furosemide (LASIX) 20 MG tablet; Take 1 tablet (20 mg total) by mouth daily.  Dispense: 30 tablet; Refill: 3  2. Chronic asthma without complication, unspecified asthma severity, unspecified whether persistent Controlled  3.  Gastroesophageal reflux disease without esophagitis Controlled  4. Benign prostatic hyperplasia, unspecified whether lower urinary tract symptoms present Advised to follow-up with the urologist. - tamsulosin (FLOMAX) 0.4 MG CAPS capsule; Take 1 capsule (0.4 mg total) by mouth daily after supper.  Dispense: 90 capsule; Refill: 1  5. GAD (generalized anxiety disorder) Discussed stress management. - escitalopram (LEXAPRO) 10 MG tablet; Take 2 tablets by mouth  once daily  Dispense: 60 tablet; Refill: 3 - ALPRAZolam (XANAX) 1 MG tablet; Take 1 tablet (1 mg total) by mouth 2 (two) times daily as needed.  Dispense: 60 tablet; Refill: 1  6. Hyperlipidemia, unspecified hyperlipidemia type Avoid fatty foods. - Lipid panel - simvastatin (ZOCOR) 40 MG tablet; TAKE ONE TABLET BY MOUTH AT BEDTIME  Dispense: 90 tablet; Refill: 1  7. Left-sided low back pain with left-sided sciatica, unspecified chronicity - gabapentin (NEURONTIN) 300 MG capsule; Take 1 capsule (300 mg total) by mouth 3 (three) times daily.  Dispense: 90 capsule; Refill: 3  8. Urinary hesitancy Encouraged to follow-up with urologist. - tamsulosin (FLOMAX) 0.4 MG CAPS capsule; Take 1 capsule (0.4 mg total) by mouth daily after supper.  Dispense: 90 capsule; Refill: 1    Labs pending  Health maintenance reviewed Diet and exercise encouraged Continue all meds Follow up in 3 months   Hendricks Limes, BSN-RN/FNP student  [place], [place]

## 2016-09-10 NOTE — Patient Instructions (Signed)
DASH Eating Plan  DASH stands for "Dietary Approaches to Stop Hypertension." The DASH eating plan is a healthy eating plan that has been shown to reduce high blood pressure (hypertension). Additional health benefits may include reducing the risk of type 2 diabetes mellitus, heart disease, and stroke. The DASH eating plan may also help with weight loss.  WHAT DO I NEED TO KNOW ABOUT THE DASH EATING PLAN?  For the DASH eating plan, you will follow these general guidelines:  · Choose foods with a percent daily value for sodium of less than 5% (as listed on the food label).  · Use salt-free seasonings or herbs instead of table salt or sea salt.  · Check with your health care provider or pharmacist before using salt substitutes.  · Eat lower-sodium products, often labeled as "lower sodium" or "no salt added."  · Eat fresh foods.  · Eat more vegetables, fruits, and low-fat dairy products.  · Choose whole grains. Look for the word "whole" as the first word in the ingredient list.  · Choose fish and skinless chicken or turkey more often than red meat. Limit fish, poultry, and meat to 6 oz (170 g) each day.  · Limit sweets, desserts, sugars, and sugary drinks.  · Choose heart-healthy fats.  · Limit cheese to 1 oz (28 g) per day.  · Eat more home-cooked food and less restaurant, buffet, and fast food.  · Limit fried foods.  · Cook foods using methods other than frying.  · Limit canned vegetables. If you do use them, rinse them well to decrease the sodium.  · When eating at a restaurant, ask that your food be prepared with less salt, or no salt if possible.  WHAT FOODS CAN I EAT?  Seek help from a dietitian for individual calorie needs.  Grains  Whole grain or whole wheat bread. Brown rice. Whole grain or whole wheat pasta. Quinoa, bulgur, and whole grain cereals. Low-sodium cereals. Corn or whole wheat flour tortillas. Whole grain cornbread. Whole grain crackers. Low-sodium crackers.  Vegetables  Fresh or frozen vegetables  (raw, steamed, roasted, or grilled). Low-sodium or reduced-sodium tomato and vegetable juices. Low-sodium or reduced-sodium tomato sauce and paste. Low-sodium or reduced-sodium canned vegetables.   Fruits  All fresh, canned (in natural juice), or frozen fruits.  Meat and Other Protein Products  Ground beef (85% or leaner), grass-fed beef, or beef trimmed of fat. Skinless chicken or turkey. Ground chicken or turkey. Pork trimmed of fat. All fish and seafood. Eggs. Dried beans, peas, or lentils. Unsalted nuts and seeds. Unsalted canned beans.  Dairy  Low-fat dairy products, such as skim or 1% milk, 2% or reduced-fat cheeses, low-fat ricotta or cottage cheese, or plain low-fat yogurt. Low-sodium or reduced-sodium cheeses.  Fats and Oils  Tub margarines without trans fats. Light or reduced-fat mayonnaise and salad dressings (reduced sodium). Avocado. Safflower, olive, or canola oils. Natural peanut or almond butter.  Other  Unsalted popcorn and pretzels.  The items listed above may not be a complete list of recommended foods or beverages. Contact your dietitian for more options.  WHAT FOODS ARE NOT RECOMMENDED?  Grains  White bread. White pasta. White rice. Refined cornbread. Bagels and croissants. Crackers that contain trans fat.  Vegetables  Creamed or fried vegetables. Vegetables in a cheese sauce. Regular canned vegetables. Regular canned tomato sauce and paste. Regular tomato and vegetable juices.  Fruits  Dried fruits. Canned fruit in light or heavy syrup. Fruit juice.  Meat and Other Protein   Products  Fatty cuts of meat. Ribs, chicken wings, bacon, sausage, bologna, salami, chitterlings, fatback, hot dogs, bratwurst, and packaged luncheon meats. Salted nuts and seeds. Canned beans with salt.  Dairy  Whole or 2% milk, cream, half-and-half, and cream cheese. Whole-fat or sweetened yogurt. Full-fat cheeses or blue cheese. Nondairy creamers and whipped toppings. Processed cheese, cheese spreads, or cheese  curds.  Condiments  Onion and garlic salt, seasoned salt, table salt, and sea salt. Canned and packaged gravies. Worcestershire sauce. Tartar sauce. Barbecue sauce. Teriyaki sauce. Soy sauce, including reduced sodium. Steak sauce. Fish sauce. Oyster sauce. Cocktail sauce. Horseradish. Ketchup and mustard. Meat flavorings and tenderizers. Bouillon cubes. Hot sauce. Tabasco sauce. Marinades. Taco seasonings. Relishes.  Fats and Oils  Butter, stick margarine, lard, shortening, ghee, and bacon fat. Coconut, palm kernel, or palm oils. Regular salad dressings.  Other  Pickles and olives. Salted popcorn and pretzels.  The items listed above may not be a complete list of foods and beverages to avoid. Contact your dietitian for more information.  WHERE CAN I FIND MORE INFORMATION?  National Heart, Lung, and [employer] Institute: www.nhlbi.nih.gov/health/health-topics/topics/dash/     This information is not intended to replace advice given to you by your health care provider. Make sure you discuss any questions you have with your health care provider.     Document Released: 10/22/2011 Document Revised: 11/23/2014 Document Reviewed: 09/06/2013  Elsevier Interactive Patient Education ©2016 [employer]

## 2016-09-11 LAB — LIPID PANEL
CHOLESTEROL TOTAL: 160 mg/dL (ref 100–199)
Chol/HDL Ratio: 3.6 ratio units (ref 0.0–5.0)
HDL: 45 mg/dL (ref >39)
LDL Calculated: 73 mg/dL (ref 0–99)
TRIGLYCERIDES: 209 mg/dL — AB (ref 0–149)
VLDL Cholesterol Cal: 42 mg/dL — ABNORMAL HIGH (ref 5–40)

## 2016-09-11 LAB — CMP14+EGFR
ALBUMIN: 4.3 g/dL (ref 3.5–4.8)
ALK PHOS: 45 IU/L (ref 39–117)
ALT: 12 IU/L (ref 0–44)
AST: 15 IU/L (ref 0–40)
Albumin/Globulin Ratio: 2.3 — ABNORMAL HIGH (ref 1.2–2.2)
BILIRUBIN TOTAL: 0.6 mg/dL (ref 0.0–1.2)
BUN / CREAT RATIO: 14 (ref 10–24)
BUN: 18 mg/dL (ref 8–27)
CHLORIDE: 102 mmol/L (ref 96–106)
CO2: 26 mmol/L (ref 18–29)
Calcium: 8.6 mg/dL (ref 8.6–10.2)
Creatinine, Ser: 1.27 mg/dL (ref 0.76–1.27)
GFR calc Af Amer: 63 mL/min/{1.73_m2} (ref >59)
GFR calc non Af Amer: 55 mL/min/{1.73_m2} — ABNORMAL LOW (ref >59)
GLUCOSE: 99 mg/dL (ref 65–99)
Globulin, Total: 1.9 g/dL (ref 1.5–4.5)
Potassium: 4.3 mmol/L (ref 3.5–5.2)
SODIUM: 143 mmol/L (ref 134–144)
Total Protein: 6.2 g/dL (ref 6.0–8.5)

## 2016-09-27 ENCOUNTER — Other Ambulatory Visit: Payer: Self-pay | Admitting: Nurse Practitioner

## 2016-09-30 ENCOUNTER — Encounter: Payer: Self-pay | Admitting: *Deleted

## 2016-10-16 ENCOUNTER — Encounter: Payer: Self-pay | Admitting: Family Medicine

## 2016-10-16 ENCOUNTER — Ambulatory Visit (INDEPENDENT_AMBULATORY_CARE_PROVIDER_SITE_OTHER): Payer: Medicare Other | Admitting: Family Medicine

## 2016-10-16 VITALS — BP 130/75 | HR 71 | Temp 97.1°F | Ht 69.0 in | Wt 202.0 lb

## 2016-10-16 DIAGNOSIS — N481 Balanitis: Secondary | ICD-10-CM

## 2016-10-16 MED ORDER — FLUCONAZOLE 150 MG PO TABS: ORAL_TABLET | ORAL | 0 refills | Status: DC

## 2016-10-16 MED ORDER — CLOTRIMAZOLE 1 % EX CREA: 1.0000 "application " | TOPICAL_CREAM | Freq: Two times a day (BID) | CUTANEOUS | 2 refills | Status: DC

## 2016-10-16 NOTE — Patient Instructions (Signed)
Great to see you!  You have a condition called balanitis caused by yeast infection of the skin of the penis. Take diflucan 1 pill today and 1 pill on Monday, start using the cream twice daily as needed for irritation.   Please come back with any concerns

## 2016-10-16 NOTE — Progress Notes (Signed)
   HPI  Patient presents today here with foreskin and glans irritation.  Patient explains that he's had irritation and intermittent bleeding of the glans and the foreskin for about 2 weeks. The bleeding usually only happens after he washes and tries to dry himself off with a towel.  He has not been sexually active for 15 years.  He denies any burning with urination. He denies any fever, chills, sweats, or difficulty tolerating foods or fluids. He has had issues occasionally they usually respond well to Vaseline or antibiotic ointment.  His several over-the-counter creams including Vaseline, antibiotic ointment, and nexcare without improvemnt  PMH: Smoking status noted ROS: Per HPI  Objective: BP 130/75   Pulse 71   Temp 97.1 F (36.2 C) (Oral)   Ht 5\' 9"  (1.753 m)   Wt 202 lb (91.6 kg)   BMI 29.83 kg/m  Gen: NAD, alert, cooperative with exam HEENT: NCAT CV: RRR, good S1/S2, no murmur Ext: No edema, warm Neuro: Alert and oriented, No gross deficits  GU Uncircumcised penis, some erythema and irritation of the foreskin, however the glans has an area of approximately 1 cm x 0.5 cm of erythema with a few satellite lesions around it, small amount of thick white material, foreskin is easily pulled back  Assessment and plan:  # Balanitis Most likely due to yeast Tx with diflucan X 2 and clotrimazole.  RTC with any concerns or failure to improve.    Meds ordered this encounter  Medications  . fluconazole (DIFLUCAN) 150 MG tablet    Sig: Take one pill and repeat in 3 days    Dispense:  2 tablet    Refill:  0  . clotrimazole (LOTRIMIN) 1 % cream    Sig: Apply 1 application topically 2 (two) times daily.    Dispense:  30 g    Refill:  [place], MD [facility] 10/16/2016, 2:42 PM

## 2016-10-27 DIAGNOSIS — M65321 Trigger finger, right index finger: Secondary | ICD-10-CM | POA: Diagnosis not present

## 2016-11-03 ENCOUNTER — Other Ambulatory Visit: Payer: Self-pay | Admitting: Nurse Practitioner

## 2016-11-24 DIAGNOSIS — M65321 Trigger finger, right index finger: Secondary | ICD-10-CM | POA: Diagnosis not present

## 2016-11-24 DIAGNOSIS — Z4789 Encounter for other orthopedic aftercare: Secondary | ICD-10-CM | POA: Diagnosis not present

## 2016-11-24 DIAGNOSIS — S68621D Partial traumatic transphalangeal amputation of left index finger, subsequent encounter: Secondary | ICD-10-CM | POA: Diagnosis not present

## 2016-12-14 ENCOUNTER — Ambulatory Visit: Payer: Medicare Other | Admitting: Nurse Practitioner

## 2016-12-16 ENCOUNTER — Other Ambulatory Visit: Payer: Self-pay | Admitting: Nurse Practitioner

## 2017-02-03 ENCOUNTER — Other Ambulatory Visit: Payer: Self-pay | Admitting: Family Medicine

## 2017-02-04 ENCOUNTER — Other Ambulatory Visit: Payer: Self-pay | Admitting: Nurse Practitioner

## 2017-02-04 DIAGNOSIS — F411 Generalized anxiety disorder: Secondary | ICD-10-CM

## 2017-02-04 NOTE — Telephone Encounter (Signed)
Please call in xanax with 1 refills 

## 2017-02-04 NOTE — Telephone Encounter (Signed)
Refill called to [facility] 

## 2017-02-04 NOTE — Telephone Encounter (Signed)
Seen and filled 09/10/16. Call in

## 2017-03-13 ENCOUNTER — Ambulatory Visit (INDEPENDENT_AMBULATORY_CARE_PROVIDER_SITE_OTHER): Payer: Medicare Other | Admitting: Physician Assistant

## 2017-03-13 ENCOUNTER — Encounter: Payer: Self-pay | Admitting: Physician Assistant

## 2017-03-13 VITALS — BP 128/76 | HR 82 | Temp 97.6°F | Ht 69.0 in | Wt 197.0 lb

## 2017-03-13 DIAGNOSIS — J301 Allergic rhinitis due to pollen: Secondary | ICD-10-CM

## 2017-03-13 DIAGNOSIS — R42 Dizziness and giddiness: Secondary | ICD-10-CM

## 2017-03-13 MED ORDER — LORATADINE 10 MG PO TABS: 10.0000 mg | ORAL_TABLET | Freq: Every day | ORAL | 11 refills | Status: DC

## 2017-03-13 MED ORDER — MECLIZINE HCL 25 MG PO TABS: 25.0000 mg | ORAL_TABLET | Freq: Three times a day (TID) | ORAL | 0 refills | Status: DC | PRN

## 2017-03-13 NOTE — Progress Notes (Signed)
BP 128/76   Pulse 82   Temp 97.6 F (36.4 C) (Oral)   Ht 5\' 9"  (1.753 m)   Wt 197 lb (89.4 kg)   BMI 29.09 kg/m    Subjective:    Patient ID: Francisco [name], male    DOB: 26-Jul-1941, 76 y.o.   MRN: [id number]  HPI: TAUREAN JU is a 76 y.o. male presenting on 03/13/2017 for Dizziness (began 2 days ago) and Sinusitis (ears feel full, sinus congestion and drainage, cough)  He has had lots of seasonal allergies this spring. He has not been on a prescription antihistamine yet.  Has daily congestion and drainage. Takes some Nyquil at times. In last 2 days has dizziness upon standing. Has check BP and had normal readings.  More symptoms with movement and when in laying position.  Relevant past medical, surgical, family and social history reviewed and updated as indicated. Allergies and medications reviewed and updated.  Past Medical History:  Diagnosis Date  . Anxiety   . GERD (gastroesophageal reflux disease)   . Hypertension     Past Surgical History:  Procedure Laterality Date  . AMPUTATION Left 08/05/2016   Procedure: REVISION AMPUTATION LEFT INDEX FINGER REPAIR LACERATION LEFT THUMB;  Surgeon: [name], MD;  Location: [facility];  Service: Orthopedics;  Laterality: Left;  . SPINE SURGERY      Review of Systems  Constitutional: Negative for appetite change, fatigue and fever.  HENT: Positive for congestion, postnasal drip, rhinorrhea, sneezing and sore throat. Negative for nosebleeds, sinus pain and sinus pressure.   Eyes: Negative.  Negative for pain and visual disturbance.  Respiratory: Negative for cough, chest tightness, shortness of breath and wheezing.   Cardiovascular: Negative.  Negative for chest pain, palpitations and leg swelling.  Gastrointestinal: Negative.  Negative for abdominal pain, diarrhea, nausea and vomiting.  Endocrine: Negative.   Genitourinary: Negative.   Musculoskeletal: Negative for back pain and myalgias.  Skin: Negative.  Negative for color  change and rash.  Neurological: Positive for dizziness and headaches. Negative for weakness and numbness.  Psychiatric/Behavioral: Negative.     Allergies as of 03/13/2017      Reactions   Crestor [rosuvastatin]    Lipitor [atorvastatin]    Penicillins    REACTION: swelling/hives Has patient had a PCN reaction causing immediate rash, facial/tongue/throat swelling, SOB or lightheadedness with hypotension:yes Has patient had a PCN reaction causing severe rash involving mucus membranes or skin necrosis: Yes Has patient had a PCN reaction that required hospitalization No Has patient had a PCN reaction occurring within the last 10 years: No If all of the above answers are "NO", then may proceed with Cephalosporin use.   Symbicort [budesonide-formoterol Fumarate]    Pain  Behind ribs      Medication List       Accurate as of 03/13/17  8:43 AM. Always use your most recent med list.          ALPRAZolam 1 MG tablet Commonly known as:  XANAX TAKE ONE TABLET BY MOUTH TWICE DAILY AS NEEDED   aspirin 81 MG tablet Take 81 mg by mouth daily.   diphenhydrAMINE 25 MG tablet Commonly known as:  BENADRYL Take 25 mg by mouth every 6 (six) hours as needed.   escitalopram 10 MG tablet Commonly known as:  LEXAPRO Take 2 tablets by mouth  once daily   fish oil-omega-3 fatty acids 1000 MG capsule Take 2 g by mouth daily.   furosemide 20 MG tablet Commonly known  as:  LASIX Take 1 tablet (20 mg total) by mouth daily.   loratadine 10 MG tablet Commonly known as:  CLARITIN Take 1 tablet (10 mg total) by mouth daily.   meclizine 25 MG tablet Commonly known as:  ANTIVERT Take 1 tablet (25 mg total) by mouth 3 (three) times daily as needed for dizziness.   meloxicam 7.5 MG tablet Commonly known as:  MOBIC TAKE ONE TABLET BY MOUTH ONCE DAILY   simvastatin 40 MG tablet Commonly known as:  ZOCOR TAKE ONE TABLET BY MOUTH AT BEDTIME   tamsulosin 0.4 MG Caps capsule Commonly known as:   FLOMAX Take 1 capsule (0.4 mg total) by mouth daily after supper.          Objective:    BP 128/76   Pulse 82   Temp 97.6 F (36.4 C) (Oral)   Ht 5\' 9"  (1.753 m)   Wt 197 lb (89.4 kg)   BMI 29.09 kg/m   Allergies  Allergen Reactions  . Crestor [Rosuvastatin]   . Lipitor [Atorvastatin]   . Penicillins     REACTION: swelling/hives Has patient had a PCN reaction causing immediate rash, facial/tongue/throat swelling, SOB or lightheadedness with hypotension:yes Has patient had a PCN reaction causing severe rash involving mucus membranes or skin necrosis: Yes Has patient had a PCN reaction that required hospitalization No Has patient had a PCN reaction occurring within the last 10 years: No If all of the above answers are "NO", then may proceed with Cephalosporin use.   . Symbicort [Budesonide-Formoterol Fumarate]     Pain  Behind ribs    Physical Exam  Constitutional: He appears well-developed and well-nourished.  HENT:  Head: Normocephalic and atraumatic.  Right Ear: Hearing, external ear and ear canal normal. No drainage. No middle ear effusion.  Left Ear: Hearing, external ear and ear canal normal. No drainage.  No middle ear effusion.  Nose: Mucosal edema present.  Mouth/Throat: Posterior oropharyngeal erythema present.  Eyes: Conjunctivae and EOM are normal. Pupils are equal, round, and reactive to light.  Neck: Normal range of motion. Neck supple.  Cardiovascular: Normal rate, regular rhythm and normal heart sounds.   Pulmonary/Chest: Effort normal and breath sounds normal.  Abdominal: Soft. Bowel sounds are normal.  Musculoskeletal: Normal range of motion.  Neurological:  Positive Rhomberg  Skin: Skin is warm and dry.        Assessment & Plan:   1. Vertigo - meclizine (ANTIVERT) 25 MG tablet; Take 1 tablet (25 mg total) by mouth 3 (three) times daily as needed for dizziness.  Dispense: 30 tablet; Refill: 0  2. Seasonal allergic rhinitis due to pollen -  loratadine (CLARITIN) 10 MG tablet; Take 1 tablet (10 mg total) by mouth daily.  Dispense: 30 tablet; Refill: 11   Current Outpatient Prescriptions:  .  ALPRAZolam (XANAX) 1 MG tablet, TAKE ONE TABLET BY MOUTH TWICE DAILY AS NEEDED, Disp: 60 tablet, Rfl: 1 .  aspirin 81 MG tablet, Take 81 mg by mouth daily., Disp: , Rfl:  .  diphenhydrAMINE (BENADRYL) 25 MG tablet, Take 25 mg by mouth every 6 (six) hours as needed., Disp: , Rfl:  .  escitalopram (LEXAPRO) 10 MG tablet, Take 2 tablets by mouth  once daily, Disp: 60 tablet, Rfl: 3 .  fish oil-omega-3 fatty acids 1000 MG capsule, Take 2 g by mouth daily., Disp: , Rfl:  .  furosemide (LASIX) 20 MG tablet, Take 1 tablet (20 mg total) by mouth daily., Disp: 30 tablet, Rfl: 3 .  meloxicam (MOBIC) 7.5 MG tablet, TAKE ONE TABLET BY MOUTH ONCE DAILY, Disp: 30 tablet, Rfl: 0 .  simvastatin (ZOCOR) 40 MG tablet, TAKE ONE TABLET BY MOUTH AT BEDTIME, Disp: 90 tablet, Rfl: 1 .  tamsulosin (FLOMAX) 0.4 MG CAPS capsule, Take 1 capsule (0.4 mg total) by mouth daily after supper., Disp: 90 capsule, Rfl: 1 .  loratadine (CLARITIN) 10 MG tablet, Take 1 tablet (10 mg total) by mouth daily., Disp: 30 tablet, Rfl: 11 .  meclizine (ANTIVERT) 25 MG tablet, Take 1 tablet (25 mg total) by mouth 3 (three) times daily as needed for dizziness., Disp: 30 tablet, Rfl: 0  Continue all other maintenance medications as listed above.  Follow up plan: Return if symptoms worsen or fail to improve.  Educational handout given for vertigo  Terald Sleeper PA-C [facility] [address]  [place], [place] [address] [phone]   03/13/2017, 8:43 AM

## 2017-03-13 NOTE — Patient Instructions (Signed)
Labyrinthitis Labyrinthitis is an infection of the inner ear. Your inner ear is a system of tubes and canals (labyrinth). These are filled with fluid. Nerve cells in your inner ear send signals for hearing and balance to your brain. When tiny germs get inside the tubes and canals, they harm the cells that send messages to the brain. This can cause changes in hearing and balance. Follow these instructions at home:  Take medicines only as told by your doctor.  If you were prescribed an antibiotic medicine, finish all of it even if you start to feel better.  Rest as much as possible.  Avoid loud noises and bright lights.  Do not make sudden movements until any dizziness goes away.  Do not drive until your doctor says that you can.  Drink enough fluid to keep your pee (urine) clear or pale yellow.  Work with a physical therapist if you still feel dizzy after several weeks. A therapist can teach you exercises to help you deal with your dizziness.  Keep all follow-up visits as told by your doctor. This is important. Contact a doctor if:  Your symptoms do not get better with medicines.  You do not get better after two weeks.  You have a fever. Get help right away if:  You are very dizzy.  You keep throwing up (vomiting) or keep feeling sick to your stomach (nauseous).  Your hearing gets a lot worse very quickly. This information is not intended to replace advice given to you by your health care provider. Make sure you discuss any questions you have with your health care provider. Document Released: 11/02/2005 Document Revised: 04/09/2016 Document Reviewed: 08/14/2014 Elsevier Interactive Patient Education  2017 [employer].

## 2017-03-16 ENCOUNTER — Encounter: Payer: Self-pay | Admitting: Nurse Practitioner

## 2017-03-16 ENCOUNTER — Ambulatory Visit (INDEPENDENT_AMBULATORY_CARE_PROVIDER_SITE_OTHER): Payer: Medicare Other | Admitting: Nurse Practitioner

## 2017-03-16 VITALS — BP 108/68 | HR 60 | Temp 97.5°F | Ht 69.0 in | Wt 197.0 lb

## 2017-03-16 DIAGNOSIS — K219 Gastro-esophageal reflux disease without esophagitis: Secondary | ICD-10-CM

## 2017-03-16 DIAGNOSIS — R739 Hyperglycemia, unspecified: Secondary | ICD-10-CM | POA: Diagnosis not present

## 2017-03-16 DIAGNOSIS — I10 Essential (primary) hypertension: Secondary | ICD-10-CM | POA: Diagnosis not present

## 2017-03-16 DIAGNOSIS — Z6831 Body mass index (BMI) 31.0-31.9, adult: Secondary | ICD-10-CM

## 2017-03-16 DIAGNOSIS — E785 Hyperlipidemia, unspecified: Secondary | ICD-10-CM

## 2017-03-16 DIAGNOSIS — R3911 Hesitancy of micturition: Secondary | ICD-10-CM | POA: Diagnosis not present

## 2017-03-16 DIAGNOSIS — F411 Generalized anxiety disorder: Secondary | ICD-10-CM

## 2017-03-16 DIAGNOSIS — J45909 Unspecified asthma, uncomplicated: Secondary | ICD-10-CM | POA: Diagnosis not present

## 2017-03-16 DIAGNOSIS — N400 Enlarged prostate without lower urinary tract symptoms: Secondary | ICD-10-CM

## 2017-03-16 MED ORDER — TAMSULOSIN HCL 0.4 MG PO CAPS: 0.4000 mg | ORAL_CAPSULE | Freq: Every day | ORAL | 1 refills | Status: DC

## 2017-03-16 MED ORDER — FUROSEMIDE 20 MG PO TABS: 20.0000 mg | ORAL_TABLET | Freq: Every day | ORAL | 1 refills | Status: DC

## 2017-03-16 MED ORDER — FUROSEMIDE 20 MG PO TABS: 20.0000 mg | ORAL_TABLET | Freq: Every day | ORAL | 3 refills | Status: DC

## 2017-03-16 MED ORDER — ESCITALOPRAM OXALATE 10 MG PO TABS: ORAL_TABLET | ORAL | 3 refills | Status: DC

## 2017-03-16 MED ORDER — ESCITALOPRAM OXALATE 10 MG PO TABS: ORAL_TABLET | ORAL | 1 refills | Status: DC

## 2017-03-16 MED ORDER — ALPRAZOLAM 1 MG PO TABS: 1.0000 mg | ORAL_TABLET | Freq: Two times a day (BID) | ORAL | 1 refills | Status: DC | PRN

## 2017-03-16 MED ORDER — SIMVASTATIN 40 MG PO TABS: ORAL_TABLET | ORAL | 1 refills | Status: DC

## 2017-03-16 NOTE — Progress Notes (Signed)
Subjective:    Patient ID: Francisco [name], male    DOB: May 16, 1941, 76 y.o.   MRN: [id number]  HPI  Francisco [name] is here today for follow up of chronic medical problem.  Outpatient Encounter Prescriptions as of 03/16/2017  Medication Sig  . ALPRAZolam (XANAX) 1 MG tablet TAKE ONE TABLET BY MOUTH TWICE DAILY AS NEEDED  . aspirin 81 MG tablet Take 81 mg by mouth daily.  Marland Kitchen escitalopram (LEXAPRO) 10 MG tablet Take 2 tablets by mouth  once daily  . fish oil-omega-3 fatty acids 1000 MG capsule Take 2 g by mouth daily.  . furosemide (LASIX) 20 MG tablet Take 1 tablet (20 mg total) by mouth daily.  Marland Kitchen loratadine (CLARITIN) 10 MG tablet Take 1 tablet (10 mg total) by mouth daily.  . meclizine (ANTIVERT) 25 MG tablet Take 1 tablet (25 mg total) by mouth 3 (three) times daily as needed for dizziness.  . meloxicam (MOBIC) 7.5 MG tablet TAKE ONE TABLET BY MOUTH ONCE DAILY  . simvastatin (ZOCOR) 40 MG tablet TAKE ONE TABLET BY MOUTH AT BEDTIME  . tamsulosin (FLOMAX) 0.4 MG CAPS capsule Take 1 capsule (0.4 mg total) by mouth daily after supper.  . diphenhydrAMINE (BENADRYL) 25 MG tablet Take 25 mg by mouth every 6 (six) hours as needed.   No facility-administered encounter medications on file as of 03/16/2017.     1. Essential hypertension  Patient taking aspirin and following prescribed BP management.  2. Chronic asthma without complication, unspecified asthma severity, unspecified whether persistent  Patient following prescribed asthma regimen.  3. Gastroesophageal reflux disease without esophagitis  Patient has prn available.  4. Benign prostatic hyperplasia, unspecified whether lower urinary tract symptoms present  Patient taking Flomax for symptom management.  5. GAD (generalized anxiety disorder)  Patient taking Lexapro and Xanax as needed  6. Hyperlipidemia, unspecified hyperlipidemia type  Managed with simvastatin  7. BMI 31.0-31.9,adult    8. Urinary hesitancy     New  complaints: None today.   Review of Systems  Constitutional: Negative for activity change and appetite change.  HENT: Negative.   Eyes: Negative.   Respiratory: Negative.   Cardiovascular: Negative.   Gastrointestinal: Negative.   Endocrine: Negative.   Allergic/Immunologic: Negative.   Neurological: Positive for dizziness (Patient seen 03/13/17 in office for vertigo.).  Hematological: Negative.   Psychiatric/Behavioral: Negative.        Objective:   Physical Exam  Constitutional: He is oriented to person, place, and time. He appears well-developed and well-nourished.  HENT:  Head: Normocephalic.  Mouth/Throat: Oropharynx is clear and moist.  Eyes: Pupils are equal, round, and reactive to light.  Neck: Normal range of motion. Neck supple.  Cardiovascular: Normal rate, regular rhythm and normal heart sounds.   Pulmonary/Chest: Effort normal and breath sounds normal.  Abdominal: Soft. Bowel sounds are normal.  Musculoskeletal: Normal range of motion.  Neurological: He is alert and oriented to person, place, and time.  Skin: Skin is warm and dry.  Patient has lesion on left supraclavicular area that is dark, symmetrical, and raised.  Psychiatric: He has a normal mood and affect. His behavior is normal. Judgment and thought content normal.   BP 108/68   Pulse 60   Temp 97.5 F (36.4 C) (Oral)   Ht 5' 9"  (1.753 m)   Wt 197 lb (89.4 kg)   BMI 29.09 kg/m       Assessment & Plan:  1. Essential hypertension Low salt diet. - furosemide (LASIX)  20 MG tablet; Take 1 tablet (20 mg total) by mouth daily.  Dispense: 30 tablet; Refill: 3 - CMP14+EGFR  2. Chronic asthma without complication, unspecified asthma severity, unspecified whether persistent Continue current asthma regimen.  3. Gastroesophageal reflux disease without esophagitis Continue current regimen.   4. Benign prostatic hyperplasia, unspecified whether lower urinary tract symptoms present - tamsulosin (FLOMAX)  0.4 MG CAPS capsule; Take 1 capsule (0.4 mg total) by mouth daily after supper.  Dispense: 90 capsule; Refill: 1  5. GAD (generalized anxiety disorder) - escitalopram (LEXAPRO) 10 MG tablet; Take 2 tablets by mouth  once daily  Dispense: 60 tablet; Refill: 3 - ALPRAZolam (XANAX) 1 MG tablet; Take 1 tablet (1 mg total) by mouth 2 (two) times daily as needed.  Dispense: 60 tablet; Refill: 1  6. Hyperlipidemia, unspecified hyperlipidemia type Low fat diet. - Lipid panel - simvastatin (ZOCOR) 40 MG tablet; TAKE ONE TABLET BY MOUTH AT BEDTIME  Dispense: 90 tablet; Refill: 1  7. BMI 31.0-31.9,adult Discussed diet and exercise for person with BMI >25 Will recheck weight in 3-6 months   8. Urinary hesitancy - tamsulosin (FLOMAX) 0.4 MG CAPS capsule; Take 1 capsule (0.4 mg total) by mouth daily after supper.  Dispense: 90 capsule; Refill: 1    Labs pending Health maintenance reviewed Diet and exercise encouraged Continue all meds Follow up  In 3 months  [place], SNP   [name], FNP

## 2017-03-16 NOTE — Patient Instructions (Signed)

## 2017-03-17 LAB — LIPID PANEL
Chol/HDL Ratio: 3.6 ratio (ref 0.0–5.0)
Cholesterol, Total: 153 mg/dL (ref 100–199)
HDL: 43 mg/dL (ref >39)
LDL CALC: 75 mg/dL (ref 0–99)
Triglycerides: 176 mg/dL — ABNORMAL HIGH (ref 0–149)
VLDL CHOLESTEROL CAL: 35 mg/dL (ref 5–40)

## 2017-03-17 LAB — CMP14+EGFR
ALK PHOS: 46 IU/L (ref 39–117)
ALT: 13 IU/L (ref 0–44)
AST: 17 IU/L (ref 0–40)
Albumin/Globulin Ratio: 2.3 — ABNORMAL HIGH (ref 1.2–2.2)
Albumin: 4.3 g/dL (ref 3.5–4.8)
BUN/Creatinine Ratio: 18 (ref 10–24)
BUN: 21 mg/dL (ref 8–27)
Bilirubin Total: 0.7 mg/dL (ref 0.0–1.2)
CHLORIDE: 100 mmol/L (ref 96–106)
CO2: 25 mmol/L (ref 18–29)
CREATININE: 1.2 mg/dL (ref 0.76–1.27)
Calcium: 9 mg/dL (ref 8.6–10.2)
GFR calc Af Amer: 68 mL/min/{1.73_m2} (ref >59)
GFR calc non Af Amer: 59 mL/min/{1.73_m2} — ABNORMAL LOW (ref >59)
GLOBULIN, TOTAL: 1.9 g/dL (ref 1.5–4.5)
Glucose: 141 mg/dL — ABNORMAL HIGH (ref 65–99)
POTASSIUM: 4.1 mmol/L (ref 3.5–5.2)
SODIUM: 143 mmol/L (ref 134–144)
Total Protein: 6.2 g/dL (ref 6.0–8.5)

## 2017-03-19 ENCOUNTER — Other Ambulatory Visit: Payer: Self-pay | Admitting: Nurse Practitioner

## 2017-03-19 DIAGNOSIS — H2513 Age-related nuclear cataract, bilateral: Secondary | ICD-10-CM | POA: Diagnosis not present

## 2017-03-19 DIAGNOSIS — R739 Hyperglycemia, unspecified: Secondary | ICD-10-CM

## 2017-03-19 DIAGNOSIS — H40033 Anatomical narrow angle, bilateral: Secondary | ICD-10-CM | POA: Diagnosis not present

## 2017-03-19 LAB — BAYER DCA HB A1C WAIVED: HB A1C (BAYER DCA - WAIVED): 5.1 % (ref <7.0)

## 2017-03-19 NOTE — Addendum Note (Signed)
Addended by: Pollyann Kennedy F on: 03/19/2017 05:18 PM   Modules accepted: Orders

## 2017-04-05 ENCOUNTER — Other Ambulatory Visit: Payer: Self-pay | Admitting: Nurse Practitioner

## 2017-04-20 ENCOUNTER — Ambulatory Visit (INDEPENDENT_AMBULATORY_CARE_PROVIDER_SITE_OTHER): Payer: Medicare Other | Admitting: Nurse Practitioner

## 2017-04-20 VITALS — BP 136/75 | HR 63 | Temp 97.1°F | Ht 69.0 in | Wt 201.6 lb

## 2017-04-20 DIAGNOSIS — R0789 Other chest pain: Secondary | ICD-10-CM

## 2017-04-20 DIAGNOSIS — R42 Dizziness and giddiness: Secondary | ICD-10-CM

## 2017-04-20 MED ORDER — MECLIZINE HCL 25 MG PO TABS: 25.0000 mg | ORAL_TABLET | Freq: Three times a day (TID) | ORAL | 0 refills | Status: DC | PRN

## 2017-04-20 NOTE — Progress Notes (Signed)
   Subjective:    Patient ID: Francisco [name], male    DOB: July 27, 1941, 76 y.o.   MRN: [id number]  HPI Patient was on his way to the beach this morning and his left arm started getting numb and tingly with slight headache. Slight chest pain, almost feels like acid reflux. Denies SOB. Got nervous so he came back home.   Review of Systems  Constitutional: Negative.   Respiratory: Negative.  Negative for shortness of breath.   Cardiovascular: Positive for chest pain. Negative for palpitations and leg swelling.  Gastrointestinal: Negative.   Genitourinary: Negative.   Neurological: Positive for numbness (left arm).  Psychiatric/Behavioral: Negative.   All other systems reviewed and are negative.      Objective:   Physical Exam  Constitutional: He is oriented to person, place, and time. He appears well-developed and well-nourished. No distress.  Cardiovascular: Normal rate and regular rhythm.   Pulmonary/Chest: Effort normal and breath sounds normal.  Musculoskeletal:  Grips equal bil  Neurological: He is alert and oriented to person, place, and time.  Skin: Skin is warm.  Psychiatric: He has a normal mood and affect. His behavior is normal. Judgment and thought content normal.    BP 136/75 (BP Location: Left Arm, Patient Position: Sitting, Cuff Size: Normal)   Pulse 63   Temp 97.1 F (36.2 C) (Oral)   Ht 5\' 9"  (1.753 m)   Wt 201 lb 9.6 oz (91.4 kg)   SpO2 97%   BMI 29.77 kg/m   EKG- NSR- unchanged form previous      Assessment & Plan:  1. Other chest pain If returns andis worse- go to ER- but EKG loos good - EKG 12-Lead  2. Vertigo Force fluids Rest RTO prn - meclizine (ANTIVERT) 25 MG tablet; Take 1 tablet (25 mg total) by mouth 3 (three) times daily as needed for dizziness.  Dispense: 30 tablet; Refill: 0  [name], FNP

## 2017-04-20 NOTE — Patient Instructions (Signed)

## 2017-06-06 ENCOUNTER — Other Ambulatory Visit: Payer: Self-pay | Admitting: Nurse Practitioner

## 2017-06-06 DIAGNOSIS — F411 Generalized anxiety disorder: Secondary | ICD-10-CM

## 2017-06-07 NOTE — Telephone Encounter (Signed)
Please call in xanax with 1 refills 

## 2017-06-07 NOTE — Telephone Encounter (Signed)
Last filled 05/05/17, last seen 04/20/17. Call in

## 2017-06-07 NOTE — Telephone Encounter (Signed)
Refill called to [facility] 

## 2017-06-21 ENCOUNTER — Other Ambulatory Visit: Payer: Self-pay | Admitting: Nurse Practitioner

## 2017-06-24 ENCOUNTER — Other Ambulatory Visit: Payer: Self-pay | Admitting: Nurse Practitioner

## 2017-07-28 ENCOUNTER — Other Ambulatory Visit: Payer: Self-pay | Admitting: Nurse Practitioner

## 2017-07-28 DIAGNOSIS — R3911 Hesitancy of micturition: Secondary | ICD-10-CM

## 2017-07-28 DIAGNOSIS — N400 Enlarged prostate without lower urinary tract symptoms: Secondary | ICD-10-CM

## 2017-07-28 DIAGNOSIS — F411 Generalized anxiety disorder: Secondary | ICD-10-CM

## 2017-07-28 DIAGNOSIS — E785 Hyperlipidemia, unspecified: Secondary | ICD-10-CM

## 2017-07-28 DIAGNOSIS — I10 Essential (primary) hypertension: Secondary | ICD-10-CM

## 2017-08-06 ENCOUNTER — Other Ambulatory Visit: Payer: Self-pay | Admitting: Nurse Practitioner

## 2017-08-06 DIAGNOSIS — F411 Generalized anxiety disorder: Secondary | ICD-10-CM

## 2017-08-06 NOTE — Telephone Encounter (Signed)
Please call in xanax with 1 refills 

## 2017-08-06 NOTE — Telephone Encounter (Signed)
Last seen 5.1.18  MMM  If approved route to nurse to call into [facility]

## 2017-08-07 ENCOUNTER — Other Ambulatory Visit: Payer: Self-pay | Admitting: Nurse Practitioner

## 2017-08-07 DIAGNOSIS — F411 Generalized anxiety disorder: Secondary | ICD-10-CM

## 2017-08-09 ENCOUNTER — Ambulatory Visit (INDEPENDENT_AMBULATORY_CARE_PROVIDER_SITE_OTHER): Payer: Medicare Other | Admitting: Nurse Practitioner

## 2017-08-09 ENCOUNTER — Other Ambulatory Visit: Payer: Self-pay | Admitting: Nurse Practitioner

## 2017-08-09 ENCOUNTER — Encounter: Payer: Self-pay | Admitting: Nurse Practitioner

## 2017-08-09 VITALS — BP 137/76 | HR 56 | Temp 96.7°F | Ht 69.0 in | Wt 205.0 lb

## 2017-08-09 DIAGNOSIS — F411 Generalized anxiety disorder: Secondary | ICD-10-CM | POA: Diagnosis not present

## 2017-08-09 DIAGNOSIS — K219 Gastro-esophageal reflux disease without esophagitis: Secondary | ICD-10-CM

## 2017-08-09 DIAGNOSIS — Z23 Encounter for immunization: Secondary | ICD-10-CM | POA: Diagnosis not present

## 2017-08-09 DIAGNOSIS — I10 Essential (primary) hypertension: Secondary | ICD-10-CM

## 2017-08-09 DIAGNOSIS — E785 Hyperlipidemia, unspecified: Secondary | ICD-10-CM | POA: Diagnosis not present

## 2017-08-09 DIAGNOSIS — R3911 Hesitancy of micturition: Secondary | ICD-10-CM

## 2017-08-09 DIAGNOSIS — K130 Diseases of lips: Secondary | ICD-10-CM | POA: Diagnosis not present

## 2017-08-09 DIAGNOSIS — N400 Enlarged prostate without lower urinary tract symptoms: Secondary | ICD-10-CM | POA: Diagnosis not present

## 2017-08-09 DIAGNOSIS — Z6831 Body mass index (BMI) 31.0-31.9, adult: Secondary | ICD-10-CM

## 2017-08-09 DIAGNOSIS — R739 Hyperglycemia, unspecified: Secondary | ICD-10-CM | POA: Diagnosis not present

## 2017-08-09 LAB — CMP14+EGFR
ALBUMIN: 4.4 g/dL (ref 3.5–4.8)
ALT: 11 IU/L (ref 0–44)
AST: 17 IU/L (ref 0–40)
Albumin/Globulin Ratio: 2.6 — ABNORMAL HIGH (ref 1.2–2.2)
Alkaline Phosphatase: 45 IU/L (ref 39–117)
BUN / CREAT RATIO: 20 (ref 10–24)
BUN: 23 mg/dL (ref 8–27)
Bilirubin Total: 0.7 mg/dL (ref 0.0–1.2)
CALCIUM: 9.3 mg/dL (ref 8.6–10.2)
CO2: 26 mmol/L (ref 20–29)
CREATININE: 1.15 mg/dL (ref 0.76–1.27)
Chloride: 104 mmol/L (ref 96–106)
GFR calc non Af Amer: 61 mL/min/{1.73_m2} (ref >59)
GFR, EST AFRICAN AMERICAN: 71 mL/min/{1.73_m2} (ref >59)
GLUCOSE: 125 mg/dL — AB (ref 65–99)
Globulin, Total: 1.7 g/dL (ref 1.5–4.5)
Potassium: 4.3 mmol/L (ref 3.5–5.2)
Sodium: 142 mmol/L (ref 134–144)
TOTAL PROTEIN: 6.1 g/dL (ref 6.0–8.5)

## 2017-08-09 LAB — LIPID PANEL
CHOLESTEROL TOTAL: 139 mg/dL (ref 100–199)
Chol/HDL Ratio: 3.4 ratio (ref 0.0–5.0)
HDL: 41 mg/dL (ref >39)
LDL CALC: 63 mg/dL (ref 0–99)
Triglycerides: 175 mg/dL — ABNORMAL HIGH (ref 0–149)
VLDL CHOLESTEROL CAL: 35 mg/dL (ref 5–40)

## 2017-08-09 MED ORDER — ESCITALOPRAM OXALATE 10 MG PO TABS: ORAL_TABLET | ORAL | 1 refills | Status: DC

## 2017-08-09 MED ORDER — SIMVASTATIN 40 MG PO TABS: ORAL_TABLET | ORAL | 1 refills | Status: DC

## 2017-08-09 MED ORDER — TAMSULOSIN HCL 0.4 MG PO CAPS: 0.4000 mg | ORAL_CAPSULE | Freq: Every day | ORAL | 1 refills | Status: DC

## 2017-08-09 MED ORDER — FUROSEMIDE 20 MG PO TABS: 20.0000 mg | ORAL_TABLET | Freq: Every day | ORAL | 1 refills | Status: DC

## 2017-08-09 NOTE — Patient Instructions (Signed)

## 2017-08-09 NOTE — Addendum Note (Signed)
Addended by: Rolena Infante on: 08/09/2017 11:05 AM   Modules accepted: Orders

## 2017-08-09 NOTE — Progress Notes (Signed)
Subjective:    Patient ID: Francisco [name], male    DOB: 1941/01/10, 76 y.o.   MRN: [id number]  HPI   Francisco [name] is here today for follow up of chronic medical problem.  Outpatient Encounter Prescriptions as of 08/09/2017  Medication Sig  . ALPRAZolam (XANAX) 1 MG tablet TAKE 1 TABLET BY MOUTH TWICE DAILY AS NEEDED  . aspirin 81 MG tablet Take 81 mg by mouth daily.  . diphenhydrAMINE (BENADRYL) 25 MG tablet Take 25 mg by mouth every 6 (six) hours as needed.  Marland Kitchen escitalopram (LEXAPRO) 10 MG tablet Take 2 tablets by mouth  once daily  . fish oil-omega-3 fatty acids 1000 MG capsule Take 2 g by mouth daily.  . furosemide (LASIX) 20 MG tablet Take 1 tablet (20 mg total) by mouth daily.  Marland Kitchen loratadine (CLARITIN) 10 MG tablet Take 1 tablet (10 mg total) by mouth daily.  . meclizine (ANTIVERT) 25 MG tablet Take 1 tablet (25 mg total) by mouth 3 (three) times daily as needed for dizziness.  . meloxicam (MOBIC) 7.5 MG tablet TAKE 1 TABLET BY MOUTH ONCE DAILY  . meloxicam (MOBIC) 7.5 MG tablet TAKE 1 TABLET BY MOUTH ONCE DAILY  . simvastatin (ZOCOR) 40 MG tablet TAKE ONE TABLET BY MOUTH AT BEDTIME  . tamsulosin (FLOMAX) 0.4 MG CAPS capsule Take 1 capsule (0.4 mg total) by mouth daily after supper.   No facility-administered encounter medications on file as of 08/09/2017.     1. Essential hypertension  No c/o chest pain, SOB or headache. Does not check blood pressures at home  2. Gastroesophageal reflux disease without esophagitis  Currently not taking anything.  3. Benign prostatic hyperplasia, unspecified whether lower urinary tract symptoms present  Takes flomax daly. Slight urinary hesitancy but no urinary retention that he is aware of. Has not seen urologist in years  69. Hyperlipidemia, unspecified hyperlipidemia type  No change in diat  5. GAD (generalized anxiety disorder)  Takes lexapro and xanax daily- has been in both for many years  6. BMI 31.0-31.9,adult  No weight cahnges     New complaints: None today  Social history: Wife died about a year ago- still struggles with grief. [place].  Review of Systems  Constitutional: Negative for activity change and appetite change.  HENT: Negative.   Eyes: Negative for pain.  Respiratory: Negative for shortness of breath.   Cardiovascular: Negative for chest pain, palpitations and leg swelling.  Gastrointestinal: Negative for abdominal pain.  Endocrine: Negative for polydipsia.  Genitourinary: Negative.   Skin: Negative for rash.  Neurological: Negative for dizziness, weakness and headaches.  Hematological: Does not bruise/bleed easily.  Psychiatric/Behavioral: Negative.   All other systems reviewed and are negative.      Objective:   Physical Exam  Constitutional: He is oriented to person, place, and time. He appears well-developed and well-nourished.  HENT:  Head: Normocephalic.  Right Ear: External ear normal.  Left Ear: External ear normal.  Nose: Nose normal.  Mouth/Throat: Oropharynx is clear and moist.  Eyes: Pupils are equal, round, and reactive to light. EOM are normal.  Neck: Normal range of motion. Neck supple. No JVD present. No thyromegaly present.  Cardiovascular: Normal rate, regular rhythm, normal heart sounds and intact distal pulses.  Exam reveals no gallop and no friction rub.   No murmur heard. Pulmonary/Chest: Effort normal and breath sounds normal. No respiratory distress. He has no wheezes. He has no rales.  He exhibits no tenderness.  Abdominal: Soft. Bowel sounds are normal. He exhibits no mass. There is no tenderness.  Genitourinary: Prostate normal and penis normal.  Musculoskeletal: Normal range of motion. He exhibits no edema.  Lymphadenopathy:    He has no cervical adenopathy.  Neurological: He is alert and oriented to person, place, and time. No cranial nerve deficit.  Skin: Skin is warm and dry.  Lip lesion 1.5cm raised  purplish lesion on lower lip- seems yo have enlarged   Psychiatric: He has a normal mood and affect. His behavior is normal. Judgment and thought content normal.   BP 137/76   Pulse (!) 56   Temp (!) 96.7 F (35.9 C) (Oral)   Ht 5\' 9"  (1.753 m)   Wt 205 lb (93 kg)   BMI 30.27 kg/m       Assessment & Plan:  1. Essential hypertension Low sodium diet - furosemide (LASIX) 20 MG tablet; Take 1 tablet (20 mg total) by mouth daily.  Dispense: 90 tablet; Refill: 1  2. Gastroesophageal reflux disease without esophagitis Avoid spicy foods Do not eat 2 hours prior to bedtime  3. Benign prostatic hyperplasia, unspecified whether lower urinary tract symptoms present - tamsulosin (FLOMAX) 0.4 MG CAPS capsule; Take 1 capsule (0.4 mg total) by mouth daily after supper.  Dispense: 90 capsule; Refill: 1  4. Hyperlipidemia, unspecified hyperlipidemia type Low fat diet - simvastatin (ZOCOR) 40 MG tablet; TAKE ONE TABLET BY MOUTH AT BEDTIME  Dispense: 90 tablet; Refill: 1  5. GAD (generalized anxiety disorder) Stress management - escitalopram (LEXAPRO) 10 MG tablet; Take 2 tablets by mouth  once daily  Dispense: 180 tablet; Refill: 1  6. BMI 31.0-31.9,adult Discussed diet and exercise for person with BMI >25 Will recheck weight in 3-6 months  7. Lip lesion Patient does not want to do anything about this at this time. Was told was blood blister  8. Urinary hesitancy  - tamsulosin (FLOMAX) 0.4 MG CAPS capsule; Take 1 capsule (0.4 mg total) by mouth daily after supper.  Dispense: 90 capsule; Refill: 1    Labs pending Health maintenance reviewed Diet and exercise encouraged Continue all meds Follow up  In 3 months   [place], FNP

## 2017-08-09 NOTE — Telephone Encounter (Signed)
RF called to [facility]

## 2017-08-10 ENCOUNTER — Other Ambulatory Visit: Payer: Self-pay | Admitting: Nurse Practitioner

## 2017-08-10 DIAGNOSIS — R739 Hyperglycemia, unspecified: Secondary | ICD-10-CM

## 2017-08-11 DIAGNOSIS — R739 Hyperglycemia, unspecified: Secondary | ICD-10-CM | POA: Diagnosis not present

## 2017-08-11 LAB — BAYER DCA HB A1C WAIVED: HB A1C (BAYER DCA - WAIVED): 5.6 % (ref <7.0)

## 2017-08-11 NOTE — Addendum Note (Signed)
Addended by: Liliane Bade on: 08/11/2017 03:47 PM   Modules accepted: Orders

## 2017-08-27 ENCOUNTER — Other Ambulatory Visit: Payer: Self-pay | Admitting: Nurse Practitioner

## 2017-09-14 ENCOUNTER — Ambulatory Visit (INDEPENDENT_AMBULATORY_CARE_PROVIDER_SITE_OTHER): Payer: Medicare Other | Admitting: *Deleted

## 2017-09-14 ENCOUNTER — Ambulatory Visit: Payer: Medicare Other | Admitting: *Deleted

## 2017-09-14 ENCOUNTER — Encounter: Payer: Self-pay | Admitting: *Deleted

## 2017-09-14 VITALS — BP 135/73 | HR 68 | Ht 69.0 in | Wt 201.0 lb

## 2017-09-14 DIAGNOSIS — Z0000 Encounter for general adult medical examination without abnormal findings: Secondary | ICD-10-CM

## 2017-09-14 NOTE — Patient Instructions (Signed)
  Francisco [name] , Thank you for taking time to come for your Medicare Wellness Visit. I appreciate your ongoing commitment to your health goals. Please review the following plan we discussed and let me know if I can assist you in the future.   These are the goals we discussed: Goals    . Exercise 150 minutes per week (moderate activity)       This is a list of the screening recommended for you and due dates:  Health Maintenance  Topic Date Due  . Tetanus Vaccine  08/05/2026  . Flu Shot  Completed  . Pneumonia vaccines  Completed

## 2017-09-14 NOTE — Progress Notes (Signed)
Subjective:   Francisco [name] is a 76 y.o. male who presents for an Initial Medicare Annual Wellness Visit. Francisco [name] is retired and lives at home alone. His wife passed away in 02/08/23. She suffered a stroke in 1992 and lost her ability to speak and use her right hand. He cared for her at home for most of that time. For several months after she passed he was depressed and didn't do much more than lay around the house. He is feeling better now and has started doing woodworking again. 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 Francisco [name] has 2 adult daughters, 4 grandchildren, and 3 great grandchildren. One of his grandsons and great grandchildren live across the street and he sees them daily. He has a house at [facility] as well and is actually heading there today for the week.  Review of Systems  Health is about the same as last year.   Cardiac Risk Factors include: advanced age (>61men, >54 women);dyslipidemia;male gender  Musculoskeletal: LBP with left sciatica   Other systems negative today.   Objective:    Today's Vitals   09/14/17 0845  BP: 135/73  Pulse: 68  Weight: 201 lb (91.2 kg)  Height: 5\' 9"  (1.753 m)   Body mass index is 29.68 kg/m.  Current Medications (verified) Outpatient Encounter Prescriptions as of 09/14/2017  Medication Sig  . ALPRAZolam (XANAX) 1 MG tablet TAKE 1 TABLET BY MOUTH TWICE DAILY AS NEEDED  . aspirin 81 MG tablet Take 81 mg by mouth daily.  . diphenhydrAMINE (BENADRYL) 25 MG tablet Take 25 mg by mouth every 6 (six) hours as needed.  Marland Kitchen escitalopram (LEXAPRO) 10 MG tablet Take 2 tablets by mouth  once daily  . fish oil-omega-3 fatty acids 1000 MG capsule Take 2 g by mouth daily.  . furosemide (LASIX) 20 MG tablet Take 1 tablet (20 mg total) by mouth daily.  Marland Kitchen loratadine (CLARITIN) 10 MG tablet Take 1 tablet (10 mg total) by mouth daily.  . meclizine (ANTIVERT) 25 MG tablet Take 1 tablet (25 mg total) by mouth 3 (three) times  daily as needed for dizziness.  . meloxicam (MOBIC) 7.5 MG tablet TAKE 1 TABLET BY MOUTH ONCE DAILY  . simvastatin (ZOCOR) 40 MG tablet TAKE ONE TABLET BY MOUTH AT BEDTIME  . tamsulosin (FLOMAX) 0.4 MG CAPS capsule Take 1 capsule (0.4 mg total) by mouth daily after supper.   No facility-administered encounter medications on file as of 09/14/2017.     Allergies (verified) Crestor [rosuvastatin]; Lipitor [atorvastatin]; Penicillins; and Symbicort [budesonide-formoterol fumarate]   History: Past Medical History:  Diagnosis Date  . Anxiety   . GERD (gastroesophageal reflux disease)   . Hypertension    Past Surgical History:  Procedure Laterality Date  . AMPUTATION Left 08/05/2016   Procedure: REVISION AMPUTATION LEFT INDEX FINGER REPAIR LACERATION LEFT THUMB;  Surgeon: [name], MD;  Location: [facility];  Service: Orthopedics;  Laterality: Left;  . SPINE SURGERY     Family History  Problem Relation Age of Onset  . Healthy Mother   . Transient ischemic attack Father   . Rheum arthritis Daughter    Social History   Occupational History  . Not on file.   Social History Main Topics  . Smoking status: Former Smoker    Quit date: 02/23/1972  . Smokeless tobacco: Never Used  . Alcohol use 0.5 oz/week    1 Standard drinks or equivalent per week  Comment: occasional beer  . Drug use: No  . Sexual activity: No   Tobacco Counseling No tobacco use  Activities of Daily Living In your present state of health, do you have any difficulty performing the following activities: 09/14/2017  Hearing? N  Vision? N  Difficulty concentrating or making decisions? Y  Comment Has noticed some decrease in short term memory  Walking or climbing stairs? N  Dressing or bathing? N  Doing errands, shopping? N  Preparing Food and eating ? N  Using the Toilet? N  In the past six months, have you accidently leaked urine? N  Do you have problems with loss of bowel control? N  Managing your  Medications? N  Managing your Finances? N  Housekeeping or managing your Housekeeping? N  Some recent data might be hidden    Immunizations and Health Maintenance Immunization History  Administered Date(s) Administered  . Influenza, High Dose Seasonal PF 09/10/2016  . Influenza,inj,Quad PF,6+ Mos 08/28/2013, 09/03/2014, 08/09/2017  . Influenza-Unspecified 12/03/2015  . Pneumococcal Conjugate-13 10/18/2013  . Pneumococcal Polysaccharide-23 06/16/2015  . Tdap 08/05/2016   There are no preventive care reminders to display for this patient.  Patient Care Team: [name], FNP as PCP - General (Nurse Practitioner) Anthony Sar, OD - optometry  No hospitalizations, ER visits, or surgeries this past year.     Assessment:   This is a routine wellness examination for Francisco [name].   Hearing/Vision screen No deficits noted. Eye exam is up to date. He goes yearly.   Dietary issues and exercise activities discussed: Current Exercise Habits: Home exercise routine (No structured exercise but the patient stays busy around his house and yard. He burns wood for heat and has to chop and stack it. ), Time (Minutes): 25, Frequency (Times/Week): 4, Weekly Exercise (Minutes/Week): 100, Intensity: Moderate, Exercise limited by: None identified  Goals    . Exercise 150 minutes per week (moderate activity)      Depression Screen PHQ 2/9 Scores 09/14/2017 08/09/2017 04/20/2017 03/13/2017  PHQ - 2 Score 0 2 1 1   PHQ- 9 Score - 4 - -    Fall Risk Fall Risk  09/14/2017 08/09/2017 04/20/2017 03/13/2017 10/16/2016  Falls in the past year? No No No No No  Number falls in past yr: - - - - -  Injury with Fall? - - - - -  Comment - - - - -    Cognitive Function: MMSE - Mini Mental State Exam 09/14/2017  Orientation to time 3  Orientation to Place 5  Registration 3  Attention/ Calculation 5  Recall 2  Language- name 2 objects 2  Language- repeat 1  Language- follow 3 step command 3  Language- read &  follow direction 1  Write a sentence 1  Copy design 0  Total score 26  Abnormal test but we talked extensively during the visit and I didn't have any concerns for altered cognitive function or memory loss.       Screening Tests Health Maintenance  Topic Date Due  . TETANUS/TDAP  08/05/2026  . INFLUENZA VACCINE  Completed  . PNA vac Low Risk Adult  Completed        Plan:  Exercise-Aim or at least 150 min of moderate activity a week. Walking is a good way to exercise Continue to stay active and participate in activities that you enjoy.  Keep f/u with Shelah Lewandowsky in December.  Review Advance Directives. Bring a signed/notarized copy to our office.   I have personally  reviewed and noted the following in the patient's chart:   . Medical and social history . Use of alcohol, tobacco or illicit drugs  . Current medications and supplements . Functional ability and status . Nutritional status . Physical activity . Advanced directives . List of other physicians . Hospitalizations, surgeries, and ER visits in previous 12 months . Vitals . Screenings to include cognitive, depression, and falls . Referrals and appointments  In addition, I have reviewed and discussed with patient certain preventive protocols, quality metrics, and best practice recommendations. A written personalized care plan for preventive services as well as general preventive health recommendations were provided to patient.     Chong Sicilian, RN  09/14/2017   I have reviewed and agree with the above AWV documentation.   [name], FNP

## 2017-10-02 ENCOUNTER — Other Ambulatory Visit: Payer: Self-pay | Admitting: Nurse Practitioner

## 2017-10-08 ENCOUNTER — Other Ambulatory Visit: Payer: Self-pay | Admitting: Nurse Practitioner

## 2017-10-08 DIAGNOSIS — F411 Generalized anxiety disorder: Secondary | ICD-10-CM

## 2017-10-08 NOTE — Telephone Encounter (Signed)
Called to [facility] 

## 2017-10-08 NOTE — Telephone Encounter (Signed)
Please call in alprazolam with 1 refills 

## 2017-10-13 ENCOUNTER — Ambulatory Visit (INDEPENDENT_AMBULATORY_CARE_PROVIDER_SITE_OTHER): Payer: Medicare Other | Admitting: Physician Assistant

## 2017-10-13 ENCOUNTER — Encounter: Payer: Self-pay | Admitting: Physician Assistant

## 2017-10-13 DIAGNOSIS — J189 Pneumonia, unspecified organism: Secondary | ICD-10-CM

## 2017-10-13 DIAGNOSIS — R42 Dizziness and giddiness: Secondary | ICD-10-CM

## 2017-10-13 DIAGNOSIS — J40 Bronchitis, not specified as acute or chronic: Secondary | ICD-10-CM | POA: Diagnosis not present

## 2017-10-13 MED ORDER — AZITHROMYCIN 250 MG PO TABS: ORAL_TABLET | ORAL | 0 refills | Status: DC

## 2017-10-13 MED ORDER — MECLIZINE HCL 25 MG PO TABS: 25.0000 mg | ORAL_TABLET | Freq: Three times a day (TID) | ORAL | 2 refills | Status: DC | PRN

## 2017-10-13 MED ORDER — PREDNISONE 10 MG (21) PO TBPK: ORAL_TABLET | ORAL | 0 refills | Status: DC

## 2017-10-13 MED ORDER — BENZONATATE 200 MG PO CAPS: 200.0000 mg | ORAL_CAPSULE | Freq: Two times a day (BID) | ORAL | 0 refills | Status: DC | PRN

## 2017-10-13 NOTE — Progress Notes (Signed)
BP 129/84   Pulse 69   Temp 97.9 F (36.6 C) (Oral)   Ht 5\' 9"  (1.753 m)   Wt 205 lb (93 kg)   BMI 30.27 kg/m    Subjective:    Patient ID: Francisco [name], male    DOB: Jun 25, 1941, 76 y.o.   MRN: [id number]  HPI: Francisco [name] is a 76 y.o. male presenting on 10/13/2017 for Cough and congestion and green sputum  Patient with several days of progressing upper respiratory and bronchial symptoms. Initially there was more upper respiratory congestion. This progressed to having significant cough that is productive throughout the day and severe at night. There is occasional wheezing after coughing. Sometimes there is slight dyspnea on exertion. It is productive mucus that is yellow in color. Denies any blood.   Patient reports that he also needs his Antivert refilled for occasional vertigo symptoms.  Relevant past medical, surgical, family and social history reviewed and updated as indicated. Allergies and medications reviewed and updated.  Past Medical History:  Diagnosis Date  . Anxiety   . GERD (gastroesophageal reflux disease)   . Hypertension     Past Surgical History:  Procedure Laterality Date  . AMPUTATION Left 08/05/2016   Procedure: REVISION AMPUTATION LEFT INDEX FINGER REPAIR LACERATION LEFT THUMB;  Surgeon: [name], MD;  Location: [facility];  Service: Orthopedics;  Laterality: Left;  . SPINE SURGERY      Review of Systems  Constitutional: Positive for fatigue and fever. Negative for appetite change.  HENT: Positive for congestion, sinus pressure and sore throat.   Eyes: Negative.  Negative for pain and visual disturbance.  Respiratory: Positive for cough and wheezing. Negative for chest tightness and shortness of breath.   Cardiovascular: Negative.  Negative for chest pain, palpitations and leg swelling.  Gastrointestinal: Negative.  Negative for abdominal pain, diarrhea, nausea and vomiting.  Endocrine: Negative.   Genitourinary: Negative.   Musculoskeletal:  Negative for back pain and myalgias.  Skin: Negative.  Negative for color change and rash.  Neurological: Positive for dizziness and headaches. Negative for weakness and numbness.  Psychiatric/Behavioral: Negative.     Allergies as of 10/13/2017      Reactions   Crestor [rosuvastatin]    Lipitor [atorvastatin]    Penicillins    REACTION: swelling/hives Has patient had a PCN reaction causing immediate rash, facial/tongue/throat swelling, SOB or lightheadedness with hypotension:yes Has patient had a PCN reaction causing severe rash involving mucus membranes or skin necrosis: Yes Has patient had a PCN reaction that required hospitalization No Has patient had a PCN reaction occurring within the last 10 years: No If all of the above answers are "NO", then may proceed with Cephalosporin use.   Symbicort [budesonide-formoterol Fumarate]    Pain  Behind ribs      Medication List        Accurate as of 10/13/17  3:33 PM. Always use your most recent med list.          ALPRAZolam 1 MG tablet Commonly known as:  XANAX TAKE 1 TABLET BY MOUTH TWICE DAILY AS NEEDED   aspirin 81 MG tablet Take 81 mg by mouth daily.   azithromycin 250 MG tablet Commonly known as:  ZITHROMAX Z-PAK Take as directed   benzonatate 200 MG capsule Commonly known as:  TESSALON Take 1 capsule (200 mg total) by mouth 2 (two) times daily as needed for cough.   diphenhydrAMINE 25 MG tablet Commonly known as:  BENADRYL Take 25 mg by  mouth every 6 (six) hours as needed.   escitalopram 10 MG tablet Commonly known as:  LEXAPRO Take 2 tablets by mouth  once daily   fish oil-omega-3 fatty acids 1000 MG capsule Take 2 g by mouth daily.   furosemide 20 MG tablet Commonly known as:  LASIX Take 1 tablet (20 mg total) by mouth daily.   loratadine 10 MG tablet Commonly known as:  CLARITIN Take 1 tablet (10 mg total) by mouth daily.   meclizine 25 MG tablet Commonly known as:  ANTIVERT Take 1 tablet (25 mg  total) by mouth 3 (three) times daily as needed for dizziness.   meloxicam 7.5 MG tablet Commonly known as:  MOBIC TAKE 1 TABLET BY MOUTH ONCE DAILY   predniSONE 10 MG (21) Tbpk tablet Commonly known as:  STERAPRED UNI-PAK 21 TAB Take as directed   simvastatin 40 MG tablet Commonly known as:  ZOCOR TAKE ONE TABLET BY MOUTH AT BEDTIME   tamsulosin 0.4 MG Caps capsule Commonly known as:  FLOMAX Take 1 capsule (0.4 mg total) by mouth daily after supper.          Objective:    BP 129/84   Pulse 69   Temp 97.9 F (36.6 C) (Oral)   Ht 5\' 9"  (1.753 m)   Wt 205 lb (93 kg)   BMI 30.27 kg/m   Allergies  Allergen Reactions  . Crestor [Rosuvastatin]   . Lipitor [Atorvastatin]   . Penicillins     REACTION: swelling/hives Has patient had a PCN reaction causing immediate rash, facial/tongue/throat swelling, SOB or lightheadedness with hypotension:yes Has patient had a PCN reaction causing severe rash involving mucus membranes or skin necrosis: Yes Has patient had a PCN reaction that required hospitalization No Has patient had a PCN reaction occurring within the last 10 years: No If all of the above answers are "NO", then may proceed with Cephalosporin use.   . Symbicort [Budesonide-Formoterol Fumarate]     Pain  Behind ribs    Physical Exam  Constitutional: He appears well-developed and well-nourished.  HENT:  Head: Normocephalic and atraumatic.  Right Ear: Hearing and tympanic membrane normal.  Left Ear: Hearing and tympanic membrane normal.  Nose: Mucosal edema and sinus tenderness present. No nasal deformity. Right sinus exhibits frontal sinus tenderness. Left sinus exhibits frontal sinus tenderness.  Mouth/Throat: Posterior oropharyngeal erythema present.  Eyes: Conjunctivae and EOM are normal. Pupils are equal, round, and reactive to light. Right eye exhibits no discharge. Left eye exhibits no discharge.  Neck: Normal range of motion. Neck supple.  Cardiovascular:  Normal rate, regular rhythm and normal heart sounds.  Pulmonary/Chest: Effort normal. No respiratory distress. He has no decreased breath sounds. He has wheezes. He has no rhonchi. He has no rales.  Abdominal: Soft. Bowel sounds are normal.  Musculoskeletal: Normal range of motion.  Skin: Skin is warm and dry.        Assessment & Plan:   1. Bronchitis  2. Atypical pneumonia - azithromycin (ZITHROMAX Z-PAK) 250 MG tablet; Take as directed  Dispense: 6 each; Refill: 0 - predniSONE (STERAPRED UNI-PAK 21 TAB) 10 MG (21) TBPK tablet; Take as directed  Dispense: 21 tablet; Refill: 0 - benzonatate (TESSALON) 200 MG capsule; Take 1 capsule (200 mg total) by mouth 2 (two) times daily as needed for cough.  Dispense: 20 capsule; Refill: 0  3. Vertigo - meclizine (ANTIVERT) 25 MG tablet; Take 1 tablet (25 mg total) by mouth 3 (three) times daily as needed for dizziness.  Dispense: 30 tablet; Refill: 2    Current Outpatient Medications:  .  ALPRAZolam (XANAX) 1 MG tablet, TAKE 1 TABLET BY MOUTH TWICE DAILY AS NEEDED, Disp: 60 tablet, Rfl: 1 .  aspirin 81 MG tablet, Take 81 mg by mouth daily., Disp: , Rfl:  .  azithromycin (ZITHROMAX Z-PAK) 250 MG tablet, Take as directed, Disp: 6 each, Rfl: 0 .  benzonatate (TESSALON) 200 MG capsule, Take 1 capsule (200 mg total) by mouth 2 (two) times daily as needed for cough., Disp: 20 capsule, Rfl: 0 .  diphenhydrAMINE (BENADRYL) 25 MG tablet, Take 25 mg by mouth every 6 (six) hours as needed., Disp: , Rfl:  .  escitalopram (LEXAPRO) 10 MG tablet, Take 2 tablets by mouth  once daily, Disp: 180 tablet, Rfl: 1 .  fish oil-omega-3 fatty acids 1000 MG capsule, Take 2 g by mouth daily., Disp: , Rfl:  .  furosemide (LASIX) 20 MG tablet, Take 1 tablet (20 mg total) by mouth daily., Disp: 90 tablet, Rfl: 1 .  loratadine (CLARITIN) 10 MG tablet, Take 1 tablet (10 mg total) by mouth daily., Disp: 30 tablet, Rfl: 11 .  meclizine (ANTIVERT) 25 MG tablet, Take 1 tablet  (25 mg total) by mouth 3 (three) times daily as needed for dizziness., Disp: 30 tablet, Rfl: 2 .  meloxicam (MOBIC) 7.5 MG tablet, TAKE 1 TABLET BY MOUTH ONCE DAILY, Disp: 30 tablet, Rfl: 0 .  predniSONE (STERAPRED UNI-PAK 21 TAB) 10 MG (21) TBPK tablet, Take as directed, Disp: 21 tablet, Rfl: 0 .  simvastatin (ZOCOR) 40 MG tablet, TAKE ONE TABLET BY MOUTH AT BEDTIME, Disp: 90 tablet, Rfl: 1 .  tamsulosin (FLOMAX) 0.4 MG CAPS capsule, Take 1 capsule (0.4 mg total) by mouth daily after supper., Disp: 90 capsule, Rfl: 1 Continue all other maintenance medications as listed above.  Follow up plan: Return if symptoms worsen or fail to improve.  Educational handout given for [place] PA-C [facility] [address]  [place], [place] [address] [phone]   10/13/2017, 3:33 PM

## 2017-10-13 NOTE — Patient Instructions (Signed)
In a few days you may receive a survey in the mail or online from [employer] regarding your visit with us today. Please take a moment to fill this out. Your feedback is very important to our whole office. It can help us better understand your needs as well as improve your experience and satisfaction. Thank you for taking your time to complete it. We care about you.  Alicya Bena, PA-C  

## 2017-11-03 ENCOUNTER — Other Ambulatory Visit: Payer: Self-pay | Admitting: Nurse Practitioner

## 2017-11-11 ENCOUNTER — Encounter: Payer: Self-pay | Admitting: Nurse Practitioner

## 2017-11-11 ENCOUNTER — Ambulatory Visit (INDEPENDENT_AMBULATORY_CARE_PROVIDER_SITE_OTHER): Payer: Medicare Other | Admitting: Nurse Practitioner

## 2017-11-11 VITALS — BP 123/74 | HR 61 | Temp 97.0°F | Ht 69.0 in | Wt 203.0 lb

## 2017-11-11 DIAGNOSIS — R739 Hyperglycemia, unspecified: Secondary | ICD-10-CM | POA: Diagnosis not present

## 2017-11-11 DIAGNOSIS — N400 Enlarged prostate without lower urinary tract symptoms: Secondary | ICD-10-CM

## 2017-11-11 DIAGNOSIS — F411 Generalized anxiety disorder: Secondary | ICD-10-CM | POA: Diagnosis not present

## 2017-11-11 DIAGNOSIS — Z6831 Body mass index (BMI) 31.0-31.9, adult: Secondary | ICD-10-CM | POA: Diagnosis not present

## 2017-11-11 DIAGNOSIS — I10 Essential (primary) hypertension: Secondary | ICD-10-CM

## 2017-11-11 DIAGNOSIS — K219 Gastro-esophageal reflux disease without esophagitis: Secondary | ICD-10-CM

## 2017-11-11 DIAGNOSIS — E785 Hyperlipidemia, unspecified: Secondary | ICD-10-CM

## 2017-11-11 LAB — BAYER DCA HB A1C WAIVED: HB A1C: 5.8 % (ref <7.0)

## 2017-11-11 MED ORDER — ALPRAZOLAM 1 MG PO TABS: 1.0000 mg | ORAL_TABLET | Freq: Two times a day (BID) | ORAL | 2 refills | Status: DC | PRN

## 2017-11-11 NOTE — Patient Instructions (Signed)

## 2017-11-11 NOTE — Progress Notes (Signed)
Subjective:    Patient ID: Francisco [name], male    DOB: Dec 05, 1940, 76 y.o.   MRN: [id number]  HPI  Francisco [name] is here today for follow up of chronic medical problem.  Outpatient Encounter Medications as of 11/11/2017  Medication Sig  . ALPRAZolam (XANAX) 1 MG tablet TAKE 1 TABLET BY MOUTH TWICE DAILY AS NEEDED  . aspirin 81 MG tablet Take 81 mg by mouth daily.  . diphenhydrAMINE (BENADRYL) 25 MG tablet Take 25 mg by mouth every 6 (six) hours as needed.  Marland Kitchen escitalopram (LEXAPRO) 10 MG tablet Take 2 tablets by mouth  once daily  . fish oil-omega-3 fatty acids 1000 MG capsule Take 2 g by mouth daily.  . furosemide (LASIX) 20 MG tablet Take 1 tablet (20 mg total) by mouth daily.  . meclizine (ANTIVERT) 25 MG tablet Take 1 tablet (25 mg total) by mouth 3 (three) times daily as needed for dizziness.  . meloxicam (MOBIC) 7.5 MG tablet TAKE 1 TABLET BY MOUTH ONCE DAILY  . simvastatin (ZOCOR) 40 MG tablet TAKE ONE TABLET BY MOUTH AT BEDTIME  . tamsulosin (FLOMAX) 0.4 MG CAPS capsule Take 1 capsule (0.4 mg total) by mouth daily after supper.    1. Essential hypertension  No C/O chest pain, sob or headache. Does not check blood pressure at home. Does not check blood pressure at home  2. Hyperlipidemia, unspecified hyperlipidemia type  Tries to watch diet  3. Elevated blood sugar  Does not check at home  4. Gastroesophageal reflux disease without esophagitis  Only use OTC meds when needed  5. Benign prostatic hyperplasia, unspecified whether lower urinary tract symptoms present Say sthat he is doing well right now  On flomax  6. GAD (generalized anxiety disorder)  Takes lexapro and xanax daily  7. BMI 31.0-31.9,adult  No recent weight chnages    New complaints: Just got over pneumonia- doing better  Social history: Lives alone- wife died within last year   Review of Systems  Constitutional: Negative for activity change and appetite change.  HENT: Negative.   Eyes: Negative  for pain.  Respiratory: Negative for shortness of breath.   Cardiovascular: Negative for chest pain, palpitations and leg swelling.  Gastrointestinal: Negative for abdominal pain.  Endocrine: Negative for polydipsia.  Genitourinary: Negative.   Skin: Negative for rash.  Neurological: Negative for dizziness, weakness and headaches.  Hematological: Does not bruise/bleed easily.  Psychiatric/Behavioral: Negative.   All other systems reviewed and are negative.      Objective:   Physical Exam  Constitutional: He is oriented to person, place, and time. He appears well-developed and well-nourished.  HENT:  Head: Normocephalic.  Right Ear: External ear normal.  Left Ear: External ear normal.  Nose: Nose normal.  Mouth/Throat: Oropharynx is clear and moist.  Eyes: EOM are normal. Pupils are equal, round, and reactive to light.  Neck: Normal range of motion. Neck supple. No JVD present. No thyromegaly present.  Cardiovascular: Normal rate, regular rhythm, normal heart sounds and intact distal pulses. Exam reveals no gallop and no friction rub.  No murmur heard. Pulmonary/Chest: Effort normal and breath sounds normal. No respiratory distress. He has no wheezes. He has no rales. He exhibits no tenderness.  Abdominal: Soft. Bowel sounds are normal. He exhibits no mass. There is no tenderness.  Genitourinary: Prostate normal and penis normal.  Musculoskeletal: Normal range of motion. He exhibits no edema.  Lymphadenopathy:    He has no cervical adenopathy.  Neurological: He is  alert and oriented to person, place, and time. No cranial nerve deficit.  Skin: Skin is warm and dry.  Psychiatric: He has a normal mood and affect. His behavior is normal. Judgment and thought content normal.   BP 123/74   Pulse 61   Temp (!) 97 F (36.1 C) (Oral)   Ht 5' 9"  (1.753 m)   Wt 203 lb (92.1 kg)   BMI 29.98 kg/m   hgba1c 5.8%     Assessment & Plan:  1. Essential hypertension Low sodium diet -  CMP14+EGFR  2. Hyperlipidemia, unspecified hyperlipidemia type Low fat diet - Lipid panel  3. Elevated blood sugar Watch carbs in diet - Bayer DCA Hb A1c Waived  4. Gastroesophageal reflux disease without esophagitis Avoid spicy foods Do not eat 2 hours prior to bedtime  5. Benign prostatic hyperplasia, unspecified whether lower urinary tract symptoms present Follow up with urology if have issues  6. GAD (generalized anxiety disorder) Stress management - ALPRAZolam (XANAX) 1 MG tablet; Take 1 tablet (1 mg total) by mouth 2 (two) times daily as needed.  Dispense: 60 tablet; Refill: 2  7. BMI 31.0-31.9,adult Discussed diet and exercise for person with BMI >25 Will recheck weight in 3-6 months    Labs pending Health maintenance reviewed Diet and exercise encouraged Continue all meds Follow up  In 3 months   [place], FNP

## 2017-11-12 LAB — LIPID PANEL
Chol/HDL Ratio: 3.9 ratio (ref 0.0–5.0)
Cholesterol, Total: 142 mg/dL (ref 100–199)
HDL: 36 mg/dL — ABNORMAL LOW (ref >39)
LDL Calculated: 58 mg/dL (ref 0–99)
Triglycerides: 241 mg/dL — ABNORMAL HIGH (ref 0–149)
VLDL Cholesterol Cal: 48 mg/dL — ABNORMAL HIGH (ref 5–40)

## 2017-11-12 LAB — CMP14+EGFR
A/G RATIO: 3.6 — AB (ref 1.2–2.2)
ALBUMIN: 4.3 g/dL (ref 3.5–4.8)
ALT: 13 IU/L (ref 0–44)
AST: 14 IU/L (ref 0–40)
Alkaline Phosphatase: 45 IU/L (ref 39–117)
BILIRUBIN TOTAL: 0.7 mg/dL (ref 0.0–1.2)
BUN / CREAT RATIO: 20 (ref 10–24)
BUN: 23 mg/dL (ref 8–27)
CHLORIDE: 105 mmol/L (ref 96–106)
CO2: 23 mmol/L (ref 20–29)
Calcium: 8.8 mg/dL (ref 8.6–10.2)
Creatinine, Ser: 1.15 mg/dL (ref 0.76–1.27)
GFR calc non Af Amer: 61 mL/min/{1.73_m2} (ref >59)
GFR, EST AFRICAN AMERICAN: 71 mL/min/{1.73_m2} (ref >59)
Globulin, Total: 1.2 g/dL — ABNORMAL LOW (ref 1.5–4.5)
Glucose: 119 mg/dL — ABNORMAL HIGH (ref 65–99)
POTASSIUM: 4.2 mmol/L (ref 3.5–5.2)
Sodium: 142 mmol/L (ref 134–144)
TOTAL PROTEIN: 5.5 g/dL — AB (ref 6.0–8.5)

## 2017-12-21 ENCOUNTER — Encounter: Payer: Self-pay | Admitting: Physician Assistant

## 2017-12-21 ENCOUNTER — Ambulatory Visit (INDEPENDENT_AMBULATORY_CARE_PROVIDER_SITE_OTHER): Payer: Medicare Other | Admitting: Physician Assistant

## 2017-12-21 VITALS — BP 129/67 | HR 78 | Temp 96.9°F | Ht 69.0 in | Wt 204.4 lb

## 2017-12-21 DIAGNOSIS — J40 Bronchitis, not specified as acute or chronic: Secondary | ICD-10-CM | POA: Diagnosis not present

## 2017-12-21 MED ORDER — DOXYCYCLINE HYCLATE 100 MG PO TABS: 100.0000 mg | ORAL_TABLET | Freq: Two times a day (BID) | ORAL | 0 refills | Status: DC

## 2017-12-21 MED ORDER — METHYLPREDNISOLONE ACETATE 80 MG/ML IJ SUSP
80.0000 mg | Freq: Once | INTRAMUSCULAR | Status: AC
  Administered 2017-12-21: 80 mg via INTRAMUSCULAR

## 2017-12-21 NOTE — Patient Instructions (Signed)
In a few days you may receive a survey in the mail or online from [employer] regarding your visit with us today. Please take a moment to fill this out. Your feedback is very important to our whole office. It can help us better understand your needs as well as improve your experience and satisfaction. Thank you for taking your time to complete it. We care about you.  Vernona Peake, PA-C  

## 2017-12-21 NOTE — Progress Notes (Signed)
BP 129/67   Pulse 78   Temp (!) 96.9 F (36.1 C) (Oral)   Ht 5\' 9"  (1.753 m)   Wt 204 lb 6.4 oz (92.7 kg)   SpO2 97%   BMI 30.18 kg/m    Subjective:    Patient ID: Francisco [name], male    DOB: [birth year], 77 y.o.   MRN: [id number]  HPI: Francisco [name] is a 77 y.o. male presenting on 12/21/2017 for Cough    Past Medical History:  Diagnosis Date  . Anxiety   . GERD (gastroesophageal reflux disease)   . Hypertension    Relevant past medical, surgical, family and social history reviewed and updated as indicated. Interim medical history since our last visit reviewed. Allergies and medications reviewed and updated. DATA REVIEWED: CHART IN EPIC  Family History reviewed for pertinent findings.  Review of Systems  Constitutional: Positive for fatigue. Negative for appetite change and fever.  HENT: Positive for sinus pressure and sore throat.   Eyes: Negative.  Negative for pain and visual disturbance.  Respiratory: Positive for shortness of breath and wheezing. Negative for cough and chest tightness.   Cardiovascular: Negative.  Negative for chest pain, palpitations and leg swelling.  Gastrointestinal: Negative.  Negative for abdominal pain, diarrhea, nausea and vomiting.  Endocrine: Negative.   Genitourinary: Negative.   Musculoskeletal: Positive for back pain and myalgias.  Skin: Negative.  Negative for color change and rash.  Neurological: Positive for headaches. Negative for weakness and numbness.  Psychiatric/Behavioral: Negative.     Allergies as of 12/21/2017      Reactions   Crestor [rosuvastatin]    Lipitor [atorvastatin]    Penicillins    REACTION: swelling/hives Has patient had a PCN reaction causing immediate rash, facial/tongue/throat swelling, SOB or lightheadedness with hypotension:yes Has patient had a PCN reaction causing severe rash involving mucus membranes or skin necrosis: Yes Has patient had a PCN reaction that required hospitalization No Has patient  had a PCN reaction occurring within the last 10 years: No If all of the above answers are "NO", then may proceed with Cephalosporin use.   Symbicort [budesonide-formoterol Fumarate]    Pain  Behind ribs      Medication List        Accurate as of 12/21/17  3:58 PM. Always use your most recent med list.          ALPRAZolam 1 MG tablet Commonly known as:  XANAX Take 1 tablet (1 mg total) by mouth 2 (two) times daily as needed.   aspirin 81 MG tablet Take 81 mg by mouth daily.   diphenhydrAMINE 25 MG tablet Commonly known as:  BENADRYL Take 25 mg by mouth every 6 (six) hours as needed.   doxycycline 100 MG tablet Commonly known as:  VIBRA-TABS Take 1 tablet (100 mg total) by mouth 2 (two) times daily. 1 po bid   escitalopram 10 MG tablet Commonly known as:  LEXAPRO Take 2 tablets by mouth  once daily   fish oil-omega-3 fatty acids 1000 MG capsule Take 2 g by mouth daily.   furosemide 20 MG tablet Commonly known as:  LASIX Take 1 tablet (20 mg total) by mouth daily.   meclizine 25 MG tablet Commonly known as:  ANTIVERT Take 1 tablet (25 mg total) by mouth 3 (three) times daily as needed for dizziness.   meloxicam 7.5 MG tablet Commonly known as:  MOBIC TAKE 1 TABLET BY MOUTH ONCE DAILY   simvastatin 40 MG tablet Commonly known  as:  ZOCOR TAKE ONE TABLET BY MOUTH AT BEDTIME   tamsulosin 0.4 MG Caps capsule Commonly known as:  FLOMAX Take 1 capsule (0.4 mg total) by mouth daily after supper.          Objective:    BP 129/67   Pulse 78   Temp (!) 96.9 F (36.1 C) (Oral)   Ht 5\' 9"  (1.753 m)   Wt 204 lb 6.4 oz (92.7 kg)   SpO2 97%   BMI 30.18 kg/m   Allergies  Allergen Reactions  . Crestor [Rosuvastatin]   . Lipitor [Atorvastatin]   . Penicillins     REACTION: swelling/hives Has patient had a PCN reaction causing immediate rash, facial/tongue/throat swelling, SOB or lightheadedness with hypotension:yes Has patient had a PCN reaction causing severe  rash involving mucus membranes or skin necrosis: Yes Has patient had a PCN reaction that required hospitalization No Has patient had a PCN reaction occurring within the last 10 years: No If all of the above answers are "NO", then may proceed with Cephalosporin use.   . Symbicort [Budesonide-Formoterol Fumarate]     Pain  Behind ribs    Wt Readings from Last 3 Encounters:  12/21/17 204 lb 6.4 oz (92.7 kg)  11/11/17 203 lb (92.1 kg)  10/13/17 205 lb (93 kg)    Physical Exam  Constitutional: He appears well-developed and well-nourished.  HENT:  Head: Normocephalic and atraumatic.  Right Ear: Hearing and tympanic membrane normal.  Left Ear: Hearing and tympanic membrane normal.  Nose: Mucosal edema and sinus tenderness present. No nasal deformity. Right sinus exhibits frontal sinus tenderness. Left sinus exhibits frontal sinus tenderness.  Mouth/Throat: Posterior oropharyngeal erythema present.  Eyes: Conjunctivae and EOM are normal. Pupils are equal, round, and reactive to light. Right eye exhibits no discharge. Left eye exhibits no discharge.  Neck: Normal range of motion. Neck supple.  Cardiovascular: Normal rate, regular rhythm and normal heart sounds.  Pulmonary/Chest: Effort normal. No respiratory distress. He has no decreased breath sounds. He has wheezes. He has no rhonchi. He has no rales.  Abdominal: Soft. Bowel sounds are normal.  Musculoskeletal: Normal range of motion.  Skin: Skin is warm and dry.        Assessment & Plan:   1. Bronchitis - methylPREDNISolone acetate (DEPO-MEDROL) injection 80 mg - doxycycline (VIBRA-TABS) 100 MG tablet; Take 1 tablet (100 mg total) by mouth 2 (two) times daily. 1 po bid  Dispense: 20 tablet; Refill: 0    Continue all other maintenance medications as listed above.  Follow up plan: No Follow-up on file.  Educational handout given for recheck  Terald Sleeper PA-C [place] [address]    [place], [place] [address] [phone]   12/21/2017, 3:58 PM

## 2018-01-17 ENCOUNTER — Other Ambulatory Visit: Payer: Self-pay | Admitting: Nurse Practitioner

## 2018-01-17 DIAGNOSIS — N400 Enlarged prostate without lower urinary tract symptoms: Secondary | ICD-10-CM

## 2018-01-17 DIAGNOSIS — F411 Generalized anxiety disorder: Secondary | ICD-10-CM

## 2018-01-17 DIAGNOSIS — I10 Essential (primary) hypertension: Secondary | ICD-10-CM

## 2018-01-17 DIAGNOSIS — E785 Hyperlipidemia, unspecified: Secondary | ICD-10-CM

## 2018-01-17 DIAGNOSIS — R3911 Hesitancy of micturition: Secondary | ICD-10-CM

## 2018-02-04 ENCOUNTER — Other Ambulatory Visit: Payer: Self-pay | Admitting: Nurse Practitioner

## 2018-02-09 ENCOUNTER — Encounter: Payer: Self-pay | Admitting: Nurse Practitioner

## 2018-02-09 ENCOUNTER — Ambulatory Visit (INDEPENDENT_AMBULATORY_CARE_PROVIDER_SITE_OTHER): Payer: Medicare Other | Admitting: Nurse Practitioner

## 2018-02-09 VITALS — BP 109/63 | HR 55 | Temp 96.7°F | Ht 69.0 in | Wt 201.0 lb

## 2018-02-09 DIAGNOSIS — R739 Hyperglycemia, unspecified: Secondary | ICD-10-CM | POA: Diagnosis not present

## 2018-02-09 DIAGNOSIS — K219 Gastro-esophageal reflux disease without esophagitis: Secondary | ICD-10-CM

## 2018-02-09 DIAGNOSIS — Z6831 Body mass index (BMI) 31.0-31.9, adult: Secondary | ICD-10-CM | POA: Diagnosis not present

## 2018-02-09 DIAGNOSIS — F411 Generalized anxiety disorder: Secondary | ICD-10-CM

## 2018-02-09 DIAGNOSIS — I10 Essential (primary) hypertension: Secondary | ICD-10-CM

## 2018-02-09 DIAGNOSIS — E785 Hyperlipidemia, unspecified: Secondary | ICD-10-CM

## 2018-02-09 DIAGNOSIS — R0989 Other specified symptoms and signs involving the circulatory and respiratory systems: Secondary | ICD-10-CM

## 2018-02-09 DIAGNOSIS — N400 Enlarged prostate without lower urinary tract symptoms: Secondary | ICD-10-CM

## 2018-02-09 DIAGNOSIS — R3911 Hesitancy of micturition: Secondary | ICD-10-CM | POA: Diagnosis not present

## 2018-02-09 LAB — BAYER DCA HB A1C WAIVED: HB A1C: 5 % (ref <7.0)

## 2018-02-09 MED ORDER — ALPRAZOLAM 1 MG PO TABS: 1.0000 mg | ORAL_TABLET | Freq: Two times a day (BID) | ORAL | 2 refills | Status: DC | PRN

## 2018-02-09 MED ORDER — FUROSEMIDE 20 MG PO TABS: 20.0000 mg | ORAL_TABLET | Freq: Every day | ORAL | 1 refills | Status: DC

## 2018-02-09 MED ORDER — TAMSULOSIN HCL 0.4 MG PO CAPS: ORAL_CAPSULE | ORAL | 1 refills | Status: DC

## 2018-02-09 MED ORDER — ESCITALOPRAM OXALATE 10 MG PO TABS: 20.0000 mg | ORAL_TABLET | Freq: Every day | ORAL | 1 refills | Status: DC

## 2018-02-09 MED ORDER — SIMVASTATIN 40 MG PO TABS: ORAL_TABLET | ORAL | 1 refills | Status: DC

## 2018-02-09 NOTE — Progress Notes (Signed)
Subjective:    Patient ID: Francisco [name], male    DOB: Jan 22, 1941, 77 y.o.   MRN: [id number]  HPI  Francisco [name] is here today for follow up of chronic medical problem.  Outpatient Encounter Medications as of 02/09/2018  Medication Sig  . ALPRAZolam (XANAX) 1 MG tablet Take 1 tablet (1 mg total) by mouth 2 (two) times daily as needed.  Francisco [name] aspirin 81 MG tablet Take 81 mg by mouth daily.  Francisco [name] escitalopram (LEXAPRO) 10 MG tablet TAKE 2 TABLETS BY MOUTH  ONCE DAILY  . fish oil-omega-3 fatty acids 1000 MG capsule Take 2 g by mouth daily.  . furosemide (LASIX) 20 MG tablet TAKE 1 TABLET BY MOUTH  DAILY  . meclizine (ANTIVERT) 25 MG tablet Take 1 tablet (25 mg total) by mouth 3 (three) times daily as needed for dizziness.  . meloxicam (MOBIC) 7.5 MG tablet TAKE 1 TABLET BY MOUTH ONCE DAILY  . simvastatin (ZOCOR) 40 MG tablet TAKE 1 TABLET BY MOUTH AT  BEDTIME  . tamsulosin (FLOMAX) 0.4 MG CAPS capsule TAKE 1 CAPSULE BY MOUTH  DAILY AFTER SUPPER     1. Essential hypertension  No c/o chest pain, sob or headache. Does not check blood pressures at home. BP Readings from Last 3 Encounters:  02/09/18 109/63  12/21/17 129/67  11/11/17 123/74     2. Hyperlipidemia, unspecified hyperlipidemia type  Does not watch diet since wife died  91. Elevated blood sugar  Last HGBA1c was 5.8%. He doe snot check blood sugars at home.  4. Gastroesophageal reflux disease without esophagitis  Not on any rx medication. Will take OTC meds ad needed  5. Benign prostatic hyperplasia, unspecified whether lower urinary tract symptoms present  takes flomax daily. Having trouble getting stream t start sometimes..  6. BMI 31.0-31.9,adult   no recent weigh changes  7. GAD (generalized anxiety disorder)  Takes xanax BID. He has been taking these for many years.    New complaints: none  Social history: -Lives alone since his wife died. Has 2 daughters that check on hm frequently. [place] go to soon. - son in law passed away due to clogged arteries- heart arteries 90% occluded   Review of Systems  Constitutional: Negative for activity change and appetite change.  HENT: Negative.   Eyes: Negative for pain.  Respiratory: Negative for shortness of breath.   Cardiovascular: Negative for chest pain, palpitations and leg swelling.  Gastrointestinal: Negative for abdominal pain.  Endocrine: Negative for polydipsia.  Genitourinary: Negative.   Skin: Negative for rash.  Neurological: Negative for dizziness, weakness and headaches.  Hematological: Does not bruise/bleed easily.  Psychiatric/Behavioral: Negative.   All other systems reviewed and are negative.      Objective:   Physical Exam  Constitutional: He is oriented to person, place, and time. He appears well-developed and well-nourished.  HENT:  Head: Normocephalic.  Right Ear: External ear normal.  Left Ear: External ear normal.  Nose: Nose normal.  Mouth/Throat: Oropharynx is clear and moist.  Eyes: Pupils are equal, round, and reactive to light. EOM are normal.  Neck: Normal range of motion. Neck supple. No JVD present. No thyromegaly present.  Cardiovascular: Normal rate, regular rhythm, normal heart sounds and intact distal pulses. Exam reveals no gallop and no friction rub.  No murmur heard. Faint carotid bruits bil  Pulmonary/Chest: Effort normal and breath sounds normal. No respiratory distress. He has no wheezes. He has no  rales. He exhibits no tenderness.  Abdominal: Soft. Bowel sounds are normal. He exhibits no mass. There is no tenderness.  Abdominal wall hernia- midline- nontender  Musculoskeletal: Normal range of motion. He exhibits no edema.  Lymphadenopathy:    He has no cervical adenopathy.  Neurological: He is alert and oriented to person, place, and time. No cranial nerve deficit.  Skin: Skin is warm and dry.  Psychiatric: He has a normal mood and affect. His behavior is normal.  Judgment and thought content normal.    BP 109/63   Pulse (!) 55   Temp (!) 96.7 F (35.9 C) (Oral)   Ht _0  (1.753 m)   Wt 201 lb (91.2 kg)   BMI 29.68 kg/m        Assessment & Plan:  1. Essential hypertension Low sodium diet - CMP14+EGFR - furosemide (LASIX) 20 MG tablet; Take 1 tablet (20 mg total) by mouth daily.  Dispense: 90 tablet; Refill: 1  2. Hyperlipidemia, unspecified hyperlipidemia type Low fat diet - Lipid panel - simvastatin (ZOCOR) 40 MG tablet; TAKE 1 TABLET BY MOUTH AT  BEDTIME  Dispense: 90 tablet; Refill: 1  3. Elevated blood sugar Watch carbs in diet - Bayer DCA Hb A1c Waived  4. Gastroesophageal reflux disease without esophagitis Avoid spicy foods Do not eat 2 hours prior to bedtime  5. Benign prostatic hyperplasia, unspecified whether lower urinary tract symptoms present Doe snot want to see urologist right now - tamsulosin (FLOMAX) 0.4 MG CAPS capsule; TAKE 1 CAPSULE BY MOUTH  DAILY AFTER SUPPER  Dispense: 90 capsule; Refill: 1  6. BMI 31.0-31.9,adult Discussed diet and exercise for person with BMI >25 Will recheck weight in 3-6 months  7. GAD (generalized anxiety disorder) Stress management - escitalopram (LEXAPRO) 10 MG tablet; Take 2 tablets (20 mg total) by mouth daily.  Dispense: 180 tablet; Refill: 1 - ALPRAZolam (XANAX) 1 MG tablet; Take 1 tablet (1 mg total) by mouth 2 (two) times daily as needed.  Dispense: 60 tablet; Refill: 2  8. Urinary hesitancy Does not want to see urologist - tamsulosin (FLOMAX) 0.4 MG CAPS capsule; TAKE 1 CAPSULE BY MOUTH  DAILY AFTER SUPPER  Dispense: 90 capsule; Refill: 1  9. Bilateral carotid bruits - US Carotid Duplex Bilateral; Future    Labs pending Health maintenance reviewed Diet and exercise encouraged Continue all meds Follow up  In 3 months   [place], FNP

## 2018-02-09 NOTE — Patient Instructions (Signed)

## 2018-02-10 LAB — CMP14+EGFR
ALT: 12 IU/L (ref 0–44)
AST: 15 IU/L (ref 0–40)
Albumin/Globulin Ratio: 2.7 — ABNORMAL HIGH (ref 1.2–2.2)
Albumin: 4.3 g/dL (ref 3.5–4.8)
Alkaline Phosphatase: 44 IU/L (ref 39–117)
BILIRUBIN TOTAL: 0.6 mg/dL (ref 0.0–1.2)
BUN / CREAT RATIO: 15 (ref 10–24)
BUN: 19 mg/dL (ref 8–27)
CALCIUM: 8.6 mg/dL (ref 8.6–10.2)
CHLORIDE: 107 mmol/L — AB (ref 96–106)
CO2: 20 mmol/L (ref 20–29)
Creatinine, Ser: 1.26 mg/dL (ref 0.76–1.27)
GFR, EST AFRICAN AMERICAN: 64 mL/min/{1.73_m2} (ref >59)
GFR, EST NON AFRICAN AMERICAN: 55 mL/min/{1.73_m2} — AB (ref >59)
GLUCOSE: 110 mg/dL — AB (ref 65–99)
Globulin, Total: 1.6 g/dL (ref 1.5–4.5)
Potassium: 4.4 mmol/L (ref 3.5–5.2)
Sodium: 145 mmol/L — ABNORMAL HIGH (ref 134–144)
TOTAL PROTEIN: 5.9 g/dL — AB (ref 6.0–8.5)

## 2018-02-10 LAB — PSA, TOTAL AND FREE
PROSTATE SPECIFIC AG, SERUM: 1.3 ng/mL (ref 0.0–4.0)
PSA FREE: 0.44 ng/mL
PSA, Free Pct: 33.8 %

## 2018-02-10 LAB — LIPID PANEL
CHOL/HDL RATIO: 3.5 ratio (ref 0.0–5.0)
Cholesterol, Total: 137 mg/dL (ref 100–199)
HDL: 39 mg/dL — AB (ref >39)
LDL CALC: 72 mg/dL (ref 0–99)
Triglycerides: 128 mg/dL (ref 0–149)
VLDL CHOLESTEROL CAL: 26 mg/dL (ref 5–40)

## 2018-02-14 ENCOUNTER — Ambulatory Visit (HOSPITAL_COMMUNITY)
Admission: RE | Admit: 2018-02-14 | Discharge: 2018-02-14 | Disposition: A | Discharge status: Home / Self Care | Payer: Medicare Other | Source: Ambulatory Visit | Attending: Nurse Practitioner | Admitting: Nurse Practitioner

## 2018-02-14 DIAGNOSIS — I6523 Occlusion and stenosis of bilateral carotid arteries: Secondary | ICD-10-CM | POA: Diagnosis not present

## 2018-02-14 DIAGNOSIS — R0989 Other specified symptoms and signs involving the circulatory and respiratory systems: Secondary | ICD-10-CM

## 2018-02-15 ENCOUNTER — Telehealth: Payer: Self-pay | Admitting: *Deleted

## 2018-02-15 ENCOUNTER — Telehealth: Payer: Self-pay | Admitting: Nurse Practitioner

## 2018-02-15 NOTE — Telephone Encounter (Signed)
Results arein pool

## 2018-02-16 ENCOUNTER — Telehealth: Payer: Self-pay | Admitting: Nurse Practitioner

## 2018-02-16 NOTE — Telephone Encounter (Signed)
Please review and advise.

## 2018-02-16 NOTE — Telephone Encounter (Signed)
Aware. 

## 2018-02-16 NOTE — Telephone Encounter (Signed)
Is he able to check his blood pressure at home?  His blood pressure was on the low side when he was most recently in the office.  I wonder if some of the lightheadedness could be from that.  He should check his blood pressure every day in the morning.  Will forward to his PCP so she can be following as well.  Does he take the Lasix every day?  If he is feeling lightheaded he should hold off on taking it for the next 3 days.

## 2018-02-16 NOTE — Telephone Encounter (Signed)
Patient states he can take his bp at home and will take it each morning. Does take Lasix daily and knows to hold off on It for the next 3 days.

## 2018-02-18 ENCOUNTER — Other Ambulatory Visit: Payer: Self-pay | Admitting: Physician Assistant

## 2018-02-18 DIAGNOSIS — R42 Dizziness and giddiness: Secondary | ICD-10-CM

## 2018-02-21 NOTE — Telephone Encounter (Signed)
lmtcb

## 2018-02-25 ENCOUNTER — Other Ambulatory Visit: Payer: Self-pay | Admitting: Nurse Practitioner

## 2018-02-25 DIAGNOSIS — I6522 Occlusion and stenosis of left carotid artery: Secondary | ICD-10-CM

## 2018-02-25 NOTE — Progress Notes (Signed)
Peripheral vascular

## 2018-03-11 ENCOUNTER — Ambulatory Visit (INDEPENDENT_AMBULATORY_CARE_PROVIDER_SITE_OTHER): Payer: Medicare Other

## 2018-03-11 ENCOUNTER — Encounter: Payer: Self-pay | Admitting: Family Medicine

## 2018-03-11 ENCOUNTER — Ambulatory Visit (INDEPENDENT_AMBULATORY_CARE_PROVIDER_SITE_OTHER): Payer: Medicare Other | Admitting: Family Medicine

## 2018-03-11 VITALS — BP 124/59 | HR 54 | Temp 97.3°F | Ht 69.0 in | Wt 206.0 lb

## 2018-03-11 DIAGNOSIS — R0602 Shortness of breath: Secondary | ICD-10-CM

## 2018-03-11 DIAGNOSIS — J069 Acute upper respiratory infection, unspecified: Secondary | ICD-10-CM

## 2018-03-11 MED ORDER — BENZONATATE 200 MG PO CAPS: 200.0000 mg | ORAL_CAPSULE | Freq: Two times a day (BID) | ORAL | 0 refills | Status: DC | PRN

## 2018-03-11 NOTE — Patient Instructions (Signed)
There is no evidence of pneumonia on your chest x-ray.  I sent you in a cough medication to help with cough.  If symptoms worsen, please seek immediate medical attention.  It appears that you have a viral upper respiratory infection (cold).  Cold symptoms can last up to 2 weeks.  I recommend that you only use cold medications that are safe in high blood pressure like Coricidin (generic is fine).  Other cold medications can increase your blood pressure.    - Get plenty of rest and drink plenty of fluids. - Try to breathe moist air. Use a cold mist humidifier. - Consume warm fluids (soup or tea) to provide relief for a stuffy nose and to loosen phlegm. - For nasal stuffiness, try saline nasal spray or a Neti Pot.  Afrin nasal spray can also be used but this product should not be used longer than 3 days or it will cause rebound nasal stuffiness (worsening nasal congestion). - For sore throat pain relief: suck on throat lozenges, hard candy or popsicles; gargle with warm salt water (1/4 tsp. salt per 8 oz. of water); and eat soft, bland foods. - Eat a well-balanced diet. If you cannot, ensure you are getting enough nutrients by taking a daily multivitamin. - Avoid dairy products, as they can thicken phlegm. - Avoid alcohol, as it impairs your body's immune system.  CONTACT YOUR DOCTOR IF YOU EXPERIENCE ANY OF THE FOLLOWING: - High fever - Ear pain - Sinus-type headache - Unusually severe cold symptoms - Cough that gets worse while other cold symptoms improve - Flare up of any chronic lung problem, such as asthma - Your symptoms persist longer than 2 weeks

## 2018-03-11 NOTE — Progress Notes (Signed)
Subjective: CC: URi PCP: [name], FNP TMH:[id number] L Loughner is a 77 y.o. male presenting to clinic today for:  1. Cold symptoms  Patient reports productive cough, nasal congestion, sensation of difficulty to take a deep breath in that has been ongoing for 1 week.   He denies hemoptysis, headache, dizziness, rash, nausea, vomiting, diarrhea, fevers, chills, myalgia, sick contacts, recent travel.  Patient has used cough drops, NyQuil and Alka-Seltzer plus with little relief of symptoms.  Denies history of COPD or asthma.  Denies tobacco use/ exposure.   ROS: Per HPI  Allergies  Allergen Reactions  . Crestor [Rosuvastatin]   . Lipitor [Atorvastatin]   . Penicillins     REACTION: swelling/hives Has patient had a PCN reaction causing immediate rash, facial/tongue/throat swelling, SOB or lightheadedness with hypotension:yes Has patient had a PCN reaction causing severe rash involving mucus membranes or skin necrosis: Yes Has patient had a PCN reaction that required hospitalization No Has patient had a PCN reaction occurring within the last 10 years: No If all of the above answers are "NO", then may proceed with Cephalosporin use.   . Symbicort [Budesonide-Formoterol Fumarate]     Pain  Behind ribs   Past Medical History:  Diagnosis Date  . Anxiety   . GERD (gastroesophageal reflux disease)   . Hypertension     Current Outpatient Medications:  .  ALPRAZolam (XANAX) 1 MG tablet, Take 1 tablet (1 mg total) by mouth 2 (two) times daily as needed., Disp: 60 tablet, Rfl: 2 .  aspirin 81 MG tablet, Take 81 mg by mouth daily., Disp: , Rfl:  .  escitalopram (LEXAPRO) 10 MG tablet, Take 2 tablets (20 mg total) by mouth daily., Disp: 180 tablet, Rfl: 1 .  fish oil-omega-3 fatty acids 1000 MG capsule, Take 2 g by mouth daily., Disp: , Rfl:  .  furosemide (LASIX) 20 MG tablet, Take 1 tablet (20 mg total) by mouth daily., Disp: 90 tablet, Rfl: 1 .  meclizine (ANTIVERT) 25 MG tablet,  TAKE 1 TABLET BY MOUTH THREE TIMES DAILY AS NEEDED FOR  DIZZINESS, Disp: 30 tablet, Rfl: 2 .  meloxicam (MOBIC) 7.5 MG tablet, TAKE 1 TABLET BY MOUTH ONCE DAILY, Disp: 30 tablet, Rfl: 1 .  simvastatin (ZOCOR) 40 MG tablet, TAKE 1 TABLET BY MOUTH AT  BEDTIME, Disp: 90 tablet, Rfl: 1 .  tamsulosin (FLOMAX) 0.4 MG CAPS capsule, TAKE 1 CAPSULE BY MOUTH  DAILY AFTER SUPPER, Disp: 90 capsule, Rfl: 1 Social History   Socioeconomic History  . Marital status: Widowed    Spouse name: Not on file  . Number of children: Not on file  . Years of education: Not on file  . Highest education level: Not on file  Occupational History  . Not on file  Social Needs  . Financial resource strain: Not on file  . Food insecurity:    Worry: Not on file    Inability: Not on file  . Transportation needs:    Medical: Not on file    Non-medical: Not on file  Tobacco Use  . Smoking status: Former Smoker    Last attempt to quit: 02/23/1972    Years since quitting: 46.0  . Smokeless tobacco: Never Used  Substance and Sexual Activity  . Alcohol use: Yes    Alcohol/week: 0.5 oz    Types: 1 Standard drinks or equivalent per week    Comment: occasional beer  . Drug use: No  . Sexual activity: Never  Lifestyle  .  Physical activity:    Days per week: Not on file    Minutes per session: Not on file  . Stress: Not on file  Relationships  . Social connections:    Talks on phone: Not on file    Gets together: Not on file    Attends religious service: Not on file    Active member of club or organization: Not on file    Attends meetings of clubs or organizations: Not on file    Relationship status: Not on file  . Intimate partner violence:    Fear of current or ex partner: Not on file    Emotionally abused: Not on file    Physically abused: Not on file    Forced sexual activity: Not on file  Other Topics Concern  . Not on file  Social History Narrative  . Not on file   Family History  Problem Relation Age  of Onset  . Healthy Mother   . Transient ischemic attack Father   . Rheum arthritis Daughter     Objective: Office vital signs reviewed. BP (!) 124/59   Pulse (!) 54   Temp (!) 97.3 F (36.3 C) (Oral)   Ht 5\' 9"  (1.753 m)   Wt 206 lb (93.4 kg)   SpO2 95%   BMI 30.42 kg/m   Physical Examination:  General: Awake, alert, well nourished, nontoxic appearing, No acute distress HEENT: Normal    Neck: No masses palpated. No lymphadenopathy    Ears: Tympanic membranes intact, normal light reflex, no erythema, no bulging    Eyes: PERRLA, extraocular membranes intact, sclera white    Nose: nasal turbinates moist, clear nasal discharge    Throat: moist mucus membranes, no erythema, no tonsillar exudate.  Airway is patent Cardio: regular rate and rhythm, S1S2 heard, no murmurs appreciated Pulm: Slight decrease in breath sounds in the right upper lung fields, otherwise clear to auscultation bilaterally, no wheezes, rhonchi or rales; normal work of breathing on room air  Dg Chest 2 View  Result Date: 03/11/2018 CLINICAL DATA:  Short of breath EXAM: CHEST - 2 VIEW COMPARISON:  Report 05/29/2003 FINDINGS: Postsurgical changes in the cervical spine. Mild hyperinflation. No acute infiltrate or effusion. Normal cardiomediastinal silhouette with aortic atherosclerosis. No pneumothorax. Degenerative changes of the spine. IMPRESSION: No active cardiopulmonary disease.  Hyperinflation. Electronically Signed   By: Donavan Foil M.D.   On: 03/11/2018 14:50    Assessment/ Plan: 77 y.o. male   1. URI with cough and congestion Patient is afebrile and nontoxic-appearing.  He has normal oxygen saturation on room air.  Pulmonary exam was notable for slight decreased breath sounds in the right upper lung fields.  For this reason, chest x-ray was obtained to further evaluate.  This was negative for any acute pulmonary processes.  He likely has a viral URI versus allergies.  I recommended supportive care.   Handout was provided.  Tessalon Perles prescribed to use twice daily as needed cough.  Home care instructions were reviewed.  If symptoms are persistent at this time next week, I do recommend that he call me and I will send in an antibiotic to empirically cover.  Patient was good understanding will follow-up as needed.  2. Shortness of breath - DG Chest 2 View; Future    Orders Placed This Encounter  Procedures  . DG Chest 2 View    Standing Status:   Future    Number of Occurrences:   1    Standing Expiration  Date:   05/12/2019    Order Specific Question:   Reason for Exam (SYMPTOM  OR DIAGNOSIS REQUIRED)    Answer:   decreased RUL breathsounds, SOB, cough    Order Specific Question:   Preferred imaging location?    Answer:   Internal    Order Specific Question:   Radiology Contrast Protocol - do NOT remove file path    Answer:   [email or web address]   Meds ordered this encounter  Medications  . benzonatate (TESSALON) 200 MG capsule    Sig: Take 1 capsule (200 mg total) by mouth 2 (two) times daily as needed for cough.    Dispense:  20 capsule    Refill:  [place], DO [place] [phone]

## 2018-03-18 DIAGNOSIS — H40033 Anatomical narrow angle, bilateral: Secondary | ICD-10-CM | POA: Diagnosis not present

## 2018-03-18 DIAGNOSIS — H2513 Age-related nuclear cataract, bilateral: Secondary | ICD-10-CM | POA: Diagnosis not present

## 2018-04-11 ENCOUNTER — Other Ambulatory Visit: Payer: Self-pay | Admitting: Nurse Practitioner

## 2018-04-12 NOTE — Telephone Encounter (Signed)
Last seen 03/11/18  MMM

## 2018-05-06 ENCOUNTER — Encounter (HOSPITAL_COMMUNITY): Payer: Medicare Other

## 2018-05-06 ENCOUNTER — Encounter: Payer: Medicare Other | Admitting: Vascular Surgery

## 2018-05-26 ENCOUNTER — Encounter: Payer: Self-pay | Admitting: Nurse Practitioner

## 2018-05-26 ENCOUNTER — Ambulatory Visit (INDEPENDENT_AMBULATORY_CARE_PROVIDER_SITE_OTHER): Payer: Medicare Other | Admitting: Nurse Practitioner

## 2018-05-26 VITALS — BP 131/74 | HR 59 | Temp 97.0°F | Ht 69.0 in | Wt 207.0 lb

## 2018-05-26 DIAGNOSIS — F411 Generalized anxiety disorder: Secondary | ICD-10-CM

## 2018-05-26 DIAGNOSIS — I10 Essential (primary) hypertension: Secondary | ICD-10-CM

## 2018-05-26 DIAGNOSIS — Z6831 Body mass index (BMI) 31.0-31.9, adult: Secondary | ICD-10-CM | POA: Diagnosis not present

## 2018-05-26 DIAGNOSIS — R3911 Hesitancy of micturition: Secondary | ICD-10-CM | POA: Diagnosis not present

## 2018-05-26 DIAGNOSIS — K219 Gastro-esophageal reflux disease without esophagitis: Secondary | ICD-10-CM | POA: Diagnosis not present

## 2018-05-26 DIAGNOSIS — R42 Dizziness and giddiness: Secondary | ICD-10-CM | POA: Diagnosis not present

## 2018-05-26 DIAGNOSIS — N400 Enlarged prostate without lower urinary tract symptoms: Secondary | ICD-10-CM | POA: Diagnosis not present

## 2018-05-26 DIAGNOSIS — E785 Hyperlipidemia, unspecified: Secondary | ICD-10-CM

## 2018-05-26 MED ORDER — ESCITALOPRAM OXALATE 10 MG PO TABS: 20.0000 mg | ORAL_TABLET | Freq: Every day | ORAL | 1 refills | Status: DC

## 2018-05-26 MED ORDER — TAMSULOSIN HCL 0.4 MG PO CAPS: ORAL_CAPSULE | ORAL | 1 refills | Status: DC

## 2018-05-26 MED ORDER — ALPRAZOLAM 1 MG PO TABS: 1.0000 mg | ORAL_TABLET | Freq: Two times a day (BID) | ORAL | 2 refills | Status: DC | PRN

## 2018-05-26 MED ORDER — MECLIZINE HCL 25 MG PO TABS: ORAL_TABLET | ORAL | 2 refills | Status: DC

## 2018-05-26 MED ORDER — SIMVASTATIN 40 MG PO TABS: ORAL_TABLET | ORAL | 1 refills | Status: DC

## 2018-05-26 MED ORDER — FUROSEMIDE 20 MG PO TABS: 20.0000 mg | ORAL_TABLET | Freq: Every day | ORAL | 1 refills | Status: DC

## 2018-05-26 NOTE — Progress Notes (Addendum)
Subjective:    Patient ID: Francisco [name], male    DOB: 02-Dec-1940, 77 y.o.   MRN: [id number]   Chief Complaint: Medical Management of Chronic issues  HPI:  1. Essential hypertension  No c/o chest pain, sob or headache. Does not check blood pressure at home. BP Readings from Last 3 Encounters:  05/26/18 131/74  03/11/18 (!) 124/59  02/09/18 109/63     2. Gastroesophageal reflux disease without esophagitis  Has not had any recent symptoms.  3. Benign prostatic hyperplasia, unspecified whether lower urinary tract symptoms present Has problems at night getting stream started. But has no problems during the day. He is on flomax daily. Has not seen urology lately.  4. Hyperlipidemia, unspecified hyperlipidemia type  Does not really watch diet since his wife died  64. GAD (generalized anxiety disorder)  Worries a lot about different things. Says he just cant help it.  6. BMI 31.0-31.9,adult  No recent weight changes    Outpatient Encounter Medications as of 05/26/2018  Medication Sig  . ALPRAZolam (XANAX) 1 MG tablet Take 1 tablet (1 mg total) by mouth 2 (two) times daily as needed.  Marland Kitchen aspirin 81 MG tablet Take 81 mg by mouth daily.  . benzonatate (TESSALON) 200 MG capsule Take 1 capsule (200 mg total) by mouth 2 (two) times daily as needed for cough.  . escitalopram (LEXAPRO) 10 MG tablet Take 2 tablets (20 mg total) by mouth daily.  . fish oil-omega-3 fatty acids 1000 MG capsule Take 2 g by mouth daily.  . furosemide (LASIX) 20 MG tablet Take 1 tablet (20 mg total) by mouth daily.  . meclizine (ANTIVERT) 25 MG tablet TAKE 1 TABLET BY MOUTH THREE TIMES DAILY AS NEEDED FOR  DIZZINESS  . meloxicam (MOBIC) 7.5 MG tablet TAKE 1 TABLET BY MOUTH ONCE DAILY  . simvastatin (ZOCOR) 40 MG tablet TAKE 1 TABLET BY MOUTH AT  BEDTIME  . tamsulosin (FLOMAX) 0.4 MG CAPS capsule TAKE 1 CAPSULE BY MOUTH  DAILY AFTER SUPPER      New complaints: having occasional dizziness- use to have some  antovert to take which would help but he has ran out. Dizziness usually last about 5-10 minutes.  Social history: Lives alone. Daughters check on him often   Review of Systems  Constitutional: Negative for activity change and appetite change.  HENT: Negative.   Eyes: Negative for pain.  Respiratory: Negative for shortness of breath.   Cardiovascular: Negative for chest pain, palpitations and leg swelling.  Gastrointestinal: Negative for abdominal pain.  Endocrine: Negative for polydipsia.  Genitourinary: Negative.   Musculoskeletal: Positive for arthralgias.  Skin: Negative for rash.  Neurological: Positive for dizziness. Negative for weakness and headaches.  Hematological: Does not bruise/bleed easily.  Psychiatric/Behavioral: Negative.   All other systems reviewed and are negative.      Objective:   Physical Exam  Constitutional: He is oriented to person, place, and time. He appears well-developed and well-nourished.  HENT:  Head: Normocephalic.  Nose: Mucosal edema and rhinorrhea present. Right sinus exhibits no maxillary sinus tenderness and no frontal sinus tenderness. Left sinus exhibits no maxillary sinus tenderness and no frontal sinus tenderness.  Mouth/Throat: Oropharynx is clear and moist.  Eyes: Pupils are equal, round, and reactive to light. EOM are normal.  Neck: Normal range of motion and phonation normal. Neck supple. No JVD present. Carotid bruit is not present. No thyroid mass and no thyromegaly present.  Cardiovascular: Normal rate and regular rhythm.  Pulmonary/Chest: Effort normal  and breath sounds normal. No respiratory distress.  Abdominal: Soft. Normal appearance, normal aorta and bowel sounds are normal. There is no tenderness. A hernia (abdominal wall hernia) is present.  Musculoskeletal: Normal range of motion.  Lymphadenopathy:    He has no cervical adenopathy.  Neurological: He is alert and oriented to person, place, and time. He displays normal  reflexes. No cranial nerve deficit or sensory deficit. He exhibits normal muscle tone. Coordination normal.  Skin: Skin is warm and dry.  Psychiatric: He has a normal mood and affect. His behavior is normal. Judgment and thought content normal.  Nursing note and vitals reviewed.  BP 131/74   Pulse (!) 59   Temp (!) 97 F (36.1 C) (Oral)   Ht 5\' 9"  (1.753 m)   Wt 207 lb (93.9 kg)   BMI 30.57 kg/m       Assessment & Plan:  Francisco [name] comes in today with chief complaint of Medical Management of Chronic Issues   Diagnosis and orders addressed:  1. Essential hypertension Low sodium diet - furosemide (LASIX) 20 MG tablet; Take 1 tablet (20 mg total) by mouth daily.  Dispense: 90 tablet; Refill: 1  2. Gastroesophageal reflux disease without esophagitis Avoid spicy foods Do not eat 2 hours prior to bedtime  3. Benign prostatic hyperplasia, unspecified whether lower urinary tract symptoms present Last PSA normal - tamsulosin (FLOMAX) 0.4 MG CAPS capsule; TAKE 1 CAPSULE BY MOUTH  DAILY AFTER SUPPER  Dispense: 90 capsule; Refill: 1  4. Hyperlipidemia, unspecified hyperlipidemia type Low fat diet - simvastatin (ZOCOR) 40 MG tablet; TAKE 1 TABLET BY MOUTH AT  BEDTIME  Dispense: 90 tablet; Refill: 1  5. GAD (generalized anxiety disorder) Stress management - escitalopram (LEXAPRO) 10 MG tablet; Take 2 tablets (20 mg total) by mouth daily.  Dispense: 180 tablet; Refill: 1 - ALPRAZolam (XANAX) 1 MG tablet; Take 1 tablet (1 mg total) by mouth 2 (two) times daily as needed.  Dispense: 60 tablet; Refill: 2  6. BMI 31.0-31.9,adult Discussed diet and exercise for person with BMI >25 Will recheck weight in 3-6 months  7. Vertigo Meclizine as needed  8. Urinary hesitancy Stop antihistamines and see if helps - tamsulosin (FLOMAX) 0.4 MG CAPS capsule; TAKE 1 CAPSULE BY MOUTH  DAILY AFTER SUPPER  Dispense: 90 capsule; Refill: 1   Labs pending Health Maintenance reviewed Diet and  exercise encouraged  Follow up plan: 3 months   [name], FNP

## 2018-05-26 NOTE — Addendum Note (Signed)
Addended by: Rolena Infante on: 05/26/2018 12:09 PM   Modules accepted: Orders

## 2018-05-26 NOTE — Patient Instructions (Signed)

## 2018-05-27 LAB — CMP14+EGFR
ALBUMIN: 4.2 g/dL (ref 3.5–4.8)
ALT: 16 IU/L (ref 0–44)
AST: 18 IU/L (ref 0–40)
Albumin/Globulin Ratio: 2.8 — ABNORMAL HIGH (ref 1.2–2.2)
Alkaline Phosphatase: 44 IU/L (ref 39–117)
BILIRUBIN TOTAL: 0.6 mg/dL (ref 0.0–1.2)
BUN / CREAT RATIO: 14 (ref 10–24)
BUN: 18 mg/dL (ref 8–27)
CHLORIDE: 105 mmol/L (ref 96–106)
CO2: 25 mmol/L (ref 20–29)
CREATININE: 1.31 mg/dL — AB (ref 0.76–1.27)
Calcium: 8.9 mg/dL (ref 8.6–10.2)
GFR calc non Af Amer: 52 mL/min/{1.73_m2} — ABNORMAL LOW (ref >59)
GFR, EST AFRICAN AMERICAN: 60 mL/min/{1.73_m2} (ref >59)
GLUCOSE: 104 mg/dL — AB (ref 65–99)
Globulin, Total: 1.5 g/dL (ref 1.5–4.5)
Potassium: 4.6 mmol/L (ref 3.5–5.2)
Sodium: 145 mmol/L — ABNORMAL HIGH (ref 134–144)
TOTAL PROTEIN: 5.7 g/dL — AB (ref 6.0–8.5)

## 2018-05-27 LAB — LIPID PANEL
CHOLESTEROL TOTAL: 144 mg/dL (ref 100–199)
Chol/HDL Ratio: 3.6 ratio (ref 0.0–5.0)
HDL: 40 mg/dL (ref >39)
LDL CALC: 75 mg/dL (ref 0–99)
Triglycerides: 147 mg/dL (ref 0–149)
VLDL CHOLESTEROL CAL: 29 mg/dL (ref 5–40)

## 2018-06-06 ENCOUNTER — Other Ambulatory Visit: Payer: Self-pay

## 2018-06-06 DIAGNOSIS — I6522 Occlusion and stenosis of left carotid artery: Secondary | ICD-10-CM

## 2018-06-15 ENCOUNTER — Other Ambulatory Visit: Payer: Self-pay | Admitting: Nurse Practitioner

## 2018-06-15 DIAGNOSIS — R3911 Hesitancy of micturition: Secondary | ICD-10-CM

## 2018-06-15 DIAGNOSIS — N400 Enlarged prostate without lower urinary tract symptoms: Secondary | ICD-10-CM

## 2018-06-15 DIAGNOSIS — I10 Essential (primary) hypertension: Secondary | ICD-10-CM

## 2018-06-15 DIAGNOSIS — E785 Hyperlipidemia, unspecified: Secondary | ICD-10-CM

## 2018-06-15 DIAGNOSIS — F411 Generalized anxiety disorder: Secondary | ICD-10-CM

## 2018-07-05 IMAGING — CR DG HAND COMPLETE 3+V*L*
3 series · 3 of 3 positions shown · non-contrast
Comparison: None

CLINICAL DATA: Amputation index finger with a table saw, thumb
laceration distally

EXAM:
LEFT HAND - COMPLETE 3+ VIEW

[pa]
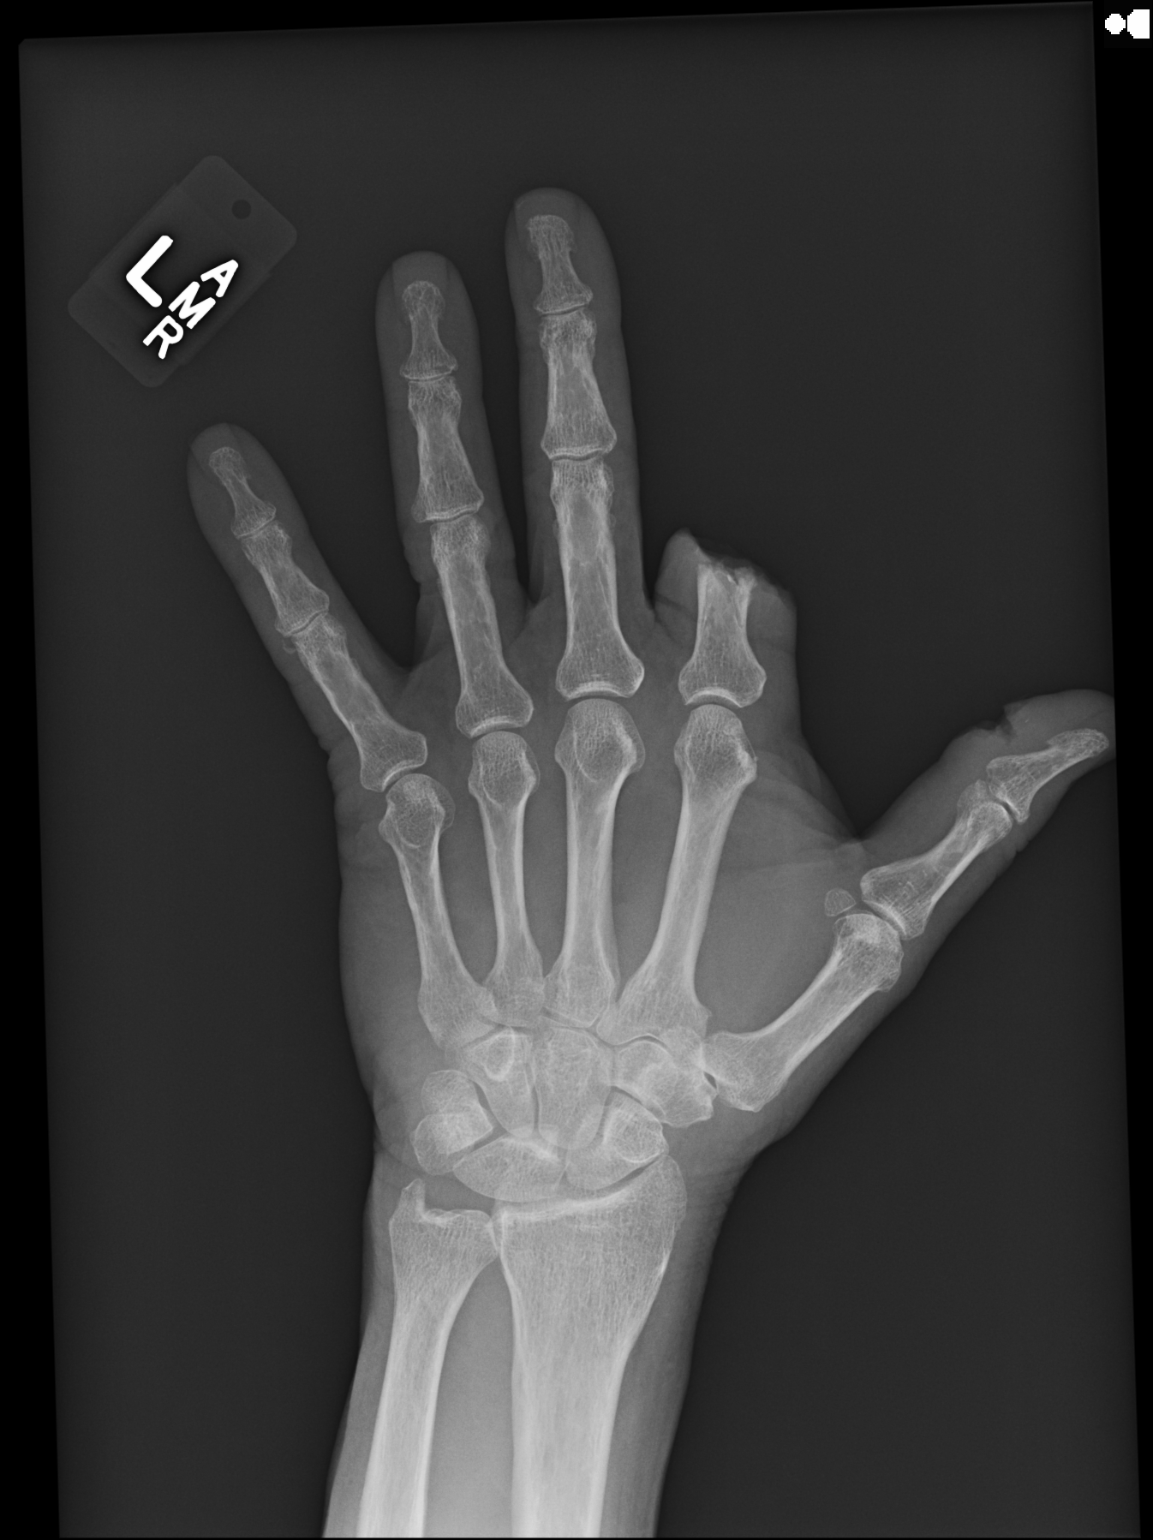

[oblique]
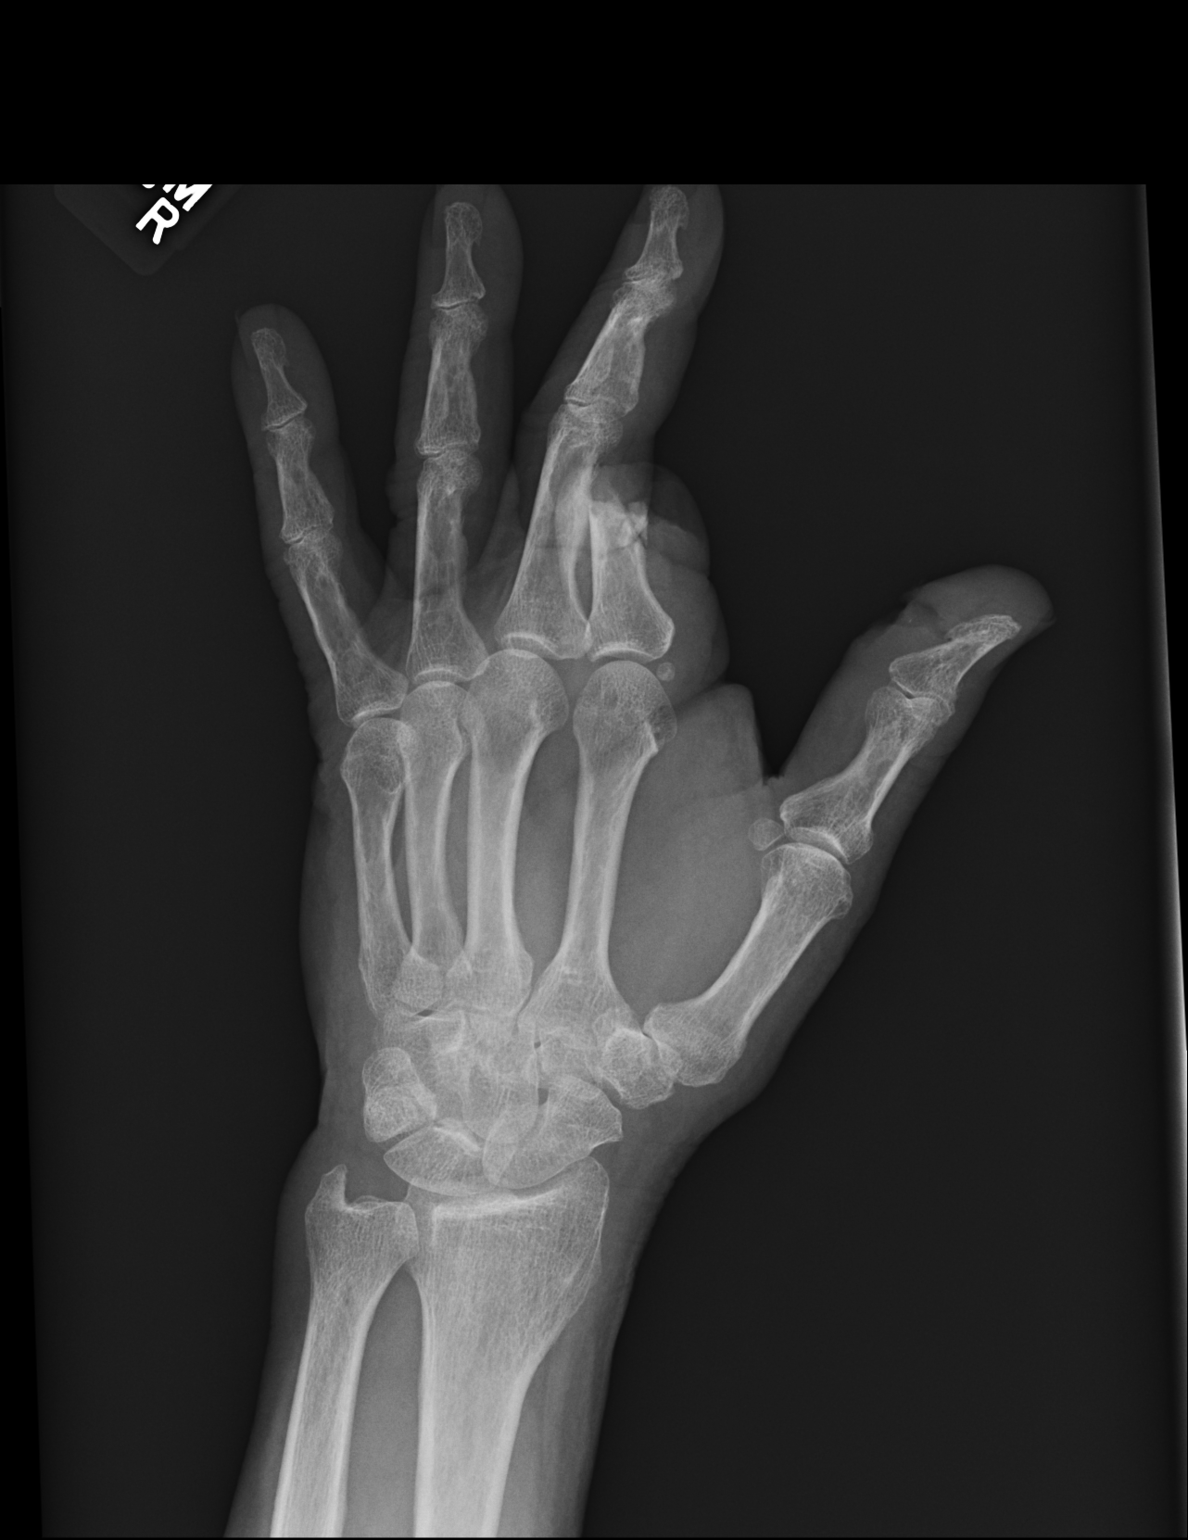

[lat]
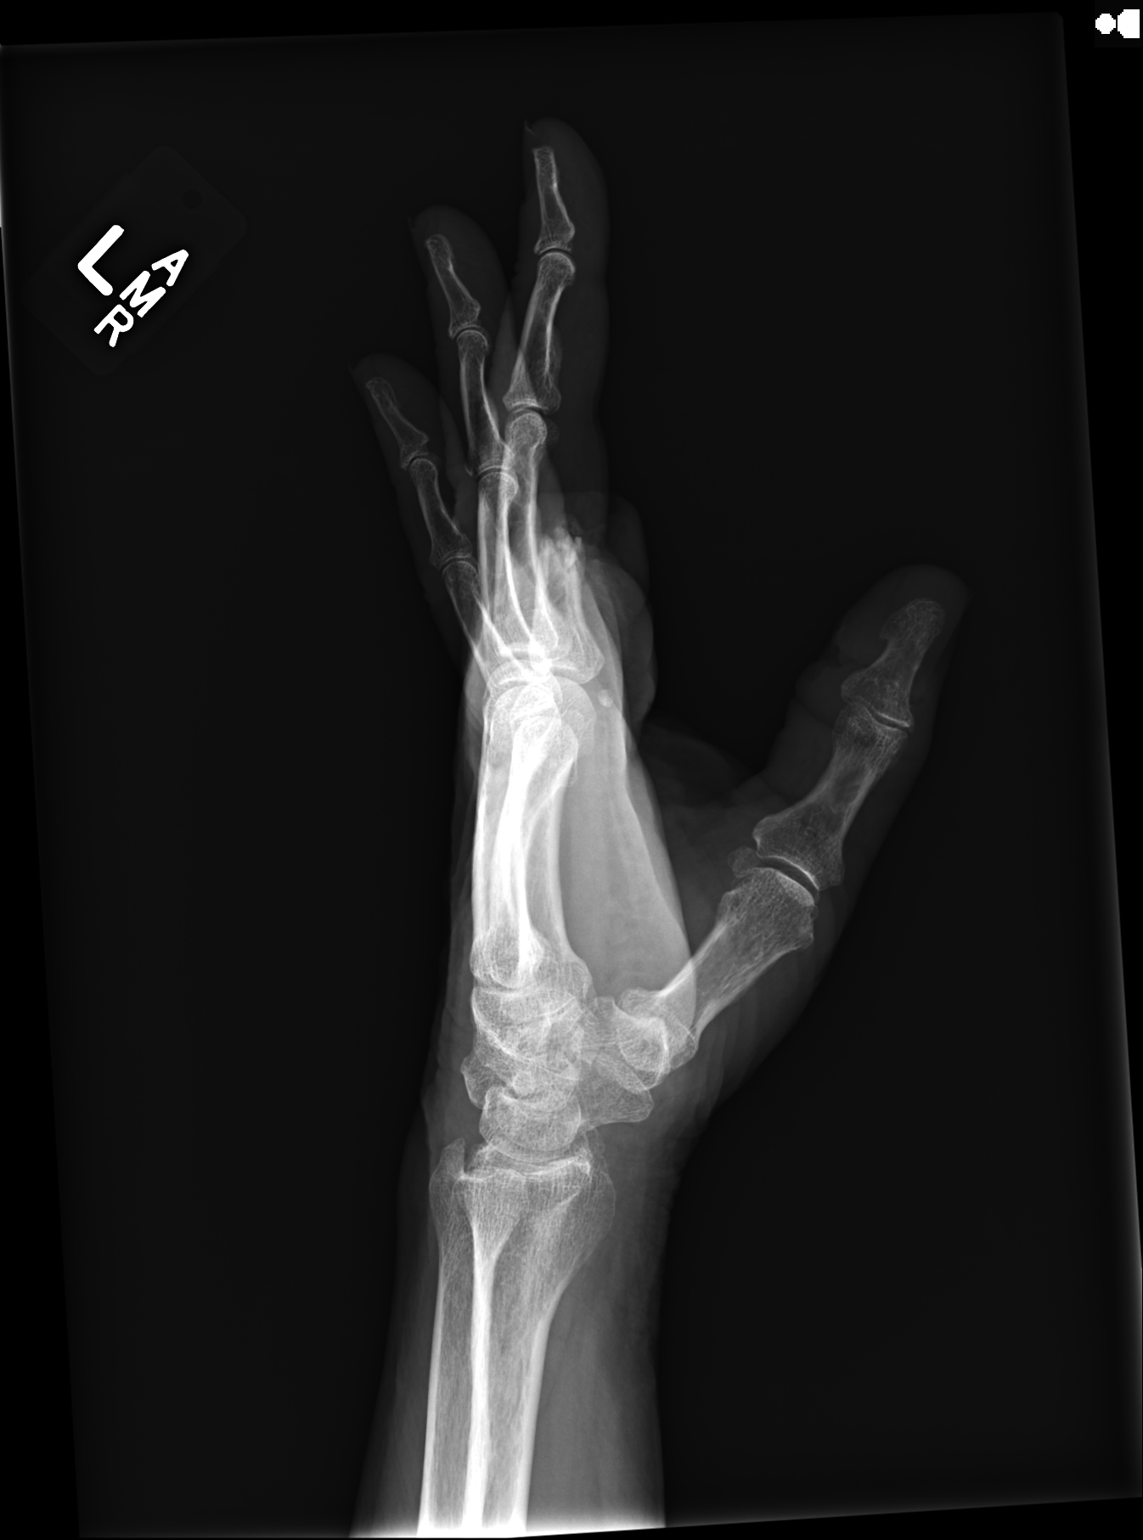

[3 of 3 positions shown; findings below may reference images not displayed]

FINDINGS: Mild osseous demineralization.

Joint spaces preserved.

Amputation of LEFT index single through the mid to distal aspect of
the proximal phalanx.

Soft tissue laceration with question tiny radiopaque foreign body
versus calcification at the wound.

No additional fracture, dislocation or bone destruction.
IMPRESSION: Amputation of LEFT index finger through the proximal phalanx.

Laceration at distal phalanx LEFT thumb with tiny radiopaque foreign
body versus calcific density at the wound.

## 2018-07-17 ENCOUNTER — Other Ambulatory Visit: Payer: Self-pay | Admitting: Nurse Practitioner

## 2018-09-02 ENCOUNTER — Ambulatory Visit (INDEPENDENT_AMBULATORY_CARE_PROVIDER_SITE_OTHER): Payer: Medicare Other

## 2018-09-02 DIAGNOSIS — Z23 Encounter for immunization: Secondary | ICD-10-CM

## 2018-09-05 ENCOUNTER — Encounter: Payer: Self-pay | Admitting: Nurse Practitioner

## 2018-09-05 ENCOUNTER — Ambulatory Visit (INDEPENDENT_AMBULATORY_CARE_PROVIDER_SITE_OTHER): Payer: Medicare Other | Admitting: Nurse Practitioner

## 2018-09-05 VITALS — BP 138/76 | HR 56 | Temp 97.0°F | Ht 69.0 in | Wt 203.0 lb

## 2018-09-05 DIAGNOSIS — K219 Gastro-esophageal reflux disease without esophagitis: Secondary | ICD-10-CM

## 2018-09-05 DIAGNOSIS — E785 Hyperlipidemia, unspecified: Secondary | ICD-10-CM

## 2018-09-05 DIAGNOSIS — Z6831 Body mass index (BMI) 31.0-31.9, adult: Secondary | ICD-10-CM

## 2018-09-05 DIAGNOSIS — J45909 Unspecified asthma, uncomplicated: Secondary | ICD-10-CM

## 2018-09-05 DIAGNOSIS — N400 Enlarged prostate without lower urinary tract symptoms: Secondary | ICD-10-CM

## 2018-09-05 DIAGNOSIS — R3911 Hesitancy of micturition: Secondary | ICD-10-CM

## 2018-09-05 DIAGNOSIS — I10 Essential (primary) hypertension: Secondary | ICD-10-CM | POA: Diagnosis not present

## 2018-09-05 DIAGNOSIS — R42 Dizziness and giddiness: Secondary | ICD-10-CM

## 2018-09-05 DIAGNOSIS — G8929 Other chronic pain: Secondary | ICD-10-CM

## 2018-09-05 DIAGNOSIS — M5442 Lumbago with sciatica, left side: Secondary | ICD-10-CM

## 2018-09-05 DIAGNOSIS — F411 Generalized anxiety disorder: Secondary | ICD-10-CM

## 2018-09-05 MED ORDER — ESCITALOPRAM OXALATE 10 MG PO TABS: 20.0000 mg | ORAL_TABLET | Freq: Every day | ORAL | 1 refills | Status: DC

## 2018-09-05 MED ORDER — TAMSULOSIN HCL 0.4 MG PO CAPS: 0.4000 mg | ORAL_CAPSULE | Freq: Every day | ORAL | 1 refills | Status: DC

## 2018-09-05 MED ORDER — FUROSEMIDE 20 MG PO TABS: 20.0000 mg | ORAL_TABLET | Freq: Every day | ORAL | 1 refills | Status: DC

## 2018-09-05 MED ORDER — ALPRAZOLAM 1 MG PO TABS: 1.0000 mg | ORAL_TABLET | Freq: Two times a day (BID) | ORAL | 2 refills | Status: DC | PRN

## 2018-09-05 MED ORDER — MECLIZINE HCL 25 MG PO TABS: ORAL_TABLET | ORAL | 2 refills | Status: DC

## 2018-09-05 MED ORDER — SIMVASTATIN 40 MG PO TABS: 40.0000 mg | ORAL_TABLET | Freq: Every day | ORAL | 1 refills | Status: DC

## 2018-09-05 NOTE — Patient Instructions (Addendum)
Sciatica Sciatica is pain, numbness, weakness, or tingling along your sciatic nerve. The sciatic nerve starts in the lower back and goes down the back of each leg. Sciatica happens when this nerve is pinched or has pressure put on it. Sciatica usually goes away on its own or with treatment. Sometimes, sciatica may keep coming back (recur). Follow these instructions at home: Medicines  Take over-the-counter and prescription medicines only as told by your doctor.  Do not drive or use heavy machinery while taking prescription pain medicine. Managing pain  If directed, put ice on the affected area. ? Put ice in a plastic bag. ? Place a towel between your skin and the bag. ? Leave the ice on for 20 minutes, 2-3 times a day.  After icing, apply heat to the affected area before you exercise or as often as told by your doctor. Use the heat source that your doctor tells you to use, such as a moist heat pack or a heating pad. ? Place a towel between your skin and the heat source. ? Leave the heat on for 20-30 minutes. ? Remove the heat if your skin turns bright red. This is especially important if you are unable to feel pain, heat, or cold. You may have a greater risk of getting burned. Activity  Return to your normal activities as told by your doctor. Ask your doctor what activities are safe for you. ? Avoid activities that make your sciatica worse.  Take short rests during the day. Rest in a lying or standing position. This is usually better than sitting to rest. ? When you rest for a long time, do some physical activity or stretching between periods of rest. ? Avoid sitting for a long time without moving. Get up and move around at least one time each hour.  Exercise and stretch regularly, as told by your doctor.  Do not lift anything that is heavier than 10 lb (4.5 kg) while you have symptoms of sciatica. ? Avoid lifting heavy things even when you do not have symptoms. ? Avoid lifting heavy  things over and over.  When you lift objects, always lift in a way that is safe for your body. To do this, you should: ? Bend your knees. ? Keep the object close to your body. ? Avoid twisting. General instructions  Use good posture. ? Avoid leaning forward when you are sitting. ? Avoid hunching over when you are standing.  Stay at a healthy weight.  Wear comfortable shoes that support your feet. Avoid wearing high heels.  Avoid sleeping on a mattress that is too soft or too hard. You might have less pain if you sleep on a mattress that is firm enough to support your back.  Keep all follow-up visits as told by your doctor. This is important. Contact a doctor if:  You have pain that: ? Wakes you up when you are sleeping. ? Gets worse when you lie down. ? Is worse than the pain you have had in the past. ? Lasts longer than 4 weeks.  You lose weight for without trying. Get help right away if:  You cannot control when you pee (urinate) or poop (have a bowel movement).  You have weakness in any of these areas and it gets worse. ? Lower back. ? Lower belly (pelvis). ? Butt (buttocks). ? Legs.  You have redness or swelling of your back.  You have a burning feeling when you pee. This information is not intended to replace   advice given to you by your health care provider. Make sure you discuss any questions you have with your health care provider. Document Released: 08/11/2008 Document Revised: 04/09/2016 Document Reviewed: 07/12/2015 Elsevier Interactive Patient Education  2018 [employer]. a Living With Anxiety After being diagnosed with an anxiety disorder, you may be relieved to know why you have felt or behaved a certain way. It is natural to also feel overwhelmed about the treatment ahead and what it will mean for your life. With care and support, you can manage this condition and recover from it. How to cope with anxiety Dealing with stress Stress is your body's  reaction to life changes and events, both good and bad. Stress can last just a few hours or it can be ongoing. Stress can play a major role in anxiety, so it is important to learn both how to cope with stress and how to think about it differently. Talk with your health care provider or a counselor to learn more about stress reduction. He or she may suggest some stress reduction techniques, such as:  Music therapy. This can include creating or listening to music that you enjoy and that inspires you.  Mindfulness-based meditation. This involves being aware of your normal breaths, rather than trying to control your breathing. It can be done while sitting or walking.  Centering prayer. This is a kind of meditation that involves focusing on a word, phrase, or sacred image that is meaningful to you and that brings you peace.  Deep breathing. To do this, expand your stomach and inhale slowly through your nose. Hold your breath for 3-5 seconds. Then exhale slowly, allowing your stomach muscles to relax.  Self-talk. This is a skill where you identify thought patterns that lead to anxiety reactions and correct those thoughts.  Muscle relaxation. This involves tensing muscles then relaxing them.  Choose a stress reduction technique that fits your lifestyle and personality. Stress reduction techniques take time and practice. Set aside 5-15 minutes a day to do them. Therapists can offer training in these techniques. The training may be covered by some insurance plans. Other things you can do to manage stress include:  Keeping a stress diary. This can help you learn what triggers your stress and ways to control your response.  Thinking about how you respond to certain situations. You may not be able to control everything, but you can control your reaction.  Making time for activities that help you relax, and not feeling guilty about spending your time in this way.  Therapy combined with coping and  stress-reduction skills provides the best chance for successful treatment. Medicines Medicines can help ease symptoms. Medicines for anxiety include:  Anti-anxiety drugs.  Antidepressants.  Beta-blockers.  Medicines may be used as the main treatment for anxiety disorder, along with therapy, or if other treatments are not working. Medicines should be prescribed by a health care provider. Relationships Relationships can play a big part in helping you recover. Try to spend more time connecting with trusted friends and family members. Consider going to couples counseling, taking family education classes, or going to family therapy. Therapy can help you and others better understand the condition. How to recognize changes in your condition Everyone has a different response to treatment for anxiety. Recovery from anxiety happens when symptoms decrease and stop interfering with your daily activities at home or work. This may mean that you will start to:  Have better concentration and focus.  Sleep better.  Be less irritable.  Have more energy.  Have improved memory.  It is important to recognize when your condition is getting worse. Contact your health care provider if your symptoms interfere with home or work and you do not feel like your condition is improving. Where to find help and support: You can get help and support from these sources:  Self-help groups.  Online and [employer].  A trusted spiritual leader.  Couples counseling.  Family education classes.  Family therapy.  Follow these instructions at home:  Eat a healthy diet that includes plenty of vegetables, fruits, whole grains, low-fat dairy products, and lean protein. Do not eat a lot of foods that are high in solid fats, added sugars, or salt.  Exercise. Most adults should do the following: ? Exercise for at least 150 minutes each week. The exercise should increase your heart rate and make you sweat  (moderate-intensity exercise). ? Strengthening exercises at least twice a week.  Cut down on caffeine, tobacco, alcohol, and other potentially harmful substances.  Get the right amount and quality of sleep. Most adults need 7-9 hours of sleep each night.  Make choices that simplify your life.  Take over-the-counter and prescription medicines only as told by your health care provider.  Avoid caffeine, alcohol, and certain over-the-counter cold medicines. These may make you feel worse. Ask your pharmacist which medicines to avoid.  Keep all follow-up visits as told by your health care provider. This is important. Questions to ask your health care provider  Would I benefit from therapy?  How often should I follow up with a health care provider?  How long do I need to take medicine?  Are there any long-term side effects of my medicine?  Are there any alternatives to taking medicine? Contact a health care provider if:  You have a hard time staying focused or finishing daily tasks.  You spend many hours a day feeling worried about everyday life.  You become exhausted by worry.  You start to have headaches, feel tense, or have nausea.  You urinate more than normal.  You have diarrhea. Get help right away if:  You have a racing heart and shortness of breath.  You have thoughts of hurting yourself or others. If you ever feel like you may hurt yourself or others, or have thoughts about taking your own life, get help right away. You can go to your nearest emergency department or call:  Your local emergency services (911 in the [place]  A suicide crisis helpline, such as the [facility] at 650-701-6142. This is open 24-hours a day.  Summary  Taking steps to deal with stress can help calm you.  Medicines cannot cure anxiety disorders, but they can help ease symptoms.  Family, friends, and partners can play a big part in helping you recover from an  anxiety disorder. This information is not intended to replace advice given to you by your health care provider. Make sure you discuss any questions you have with your health care provider. Document Released: 10/27/2016 Document Revised: 10/27/2016 Document Reviewed: 10/27/2016 Elsevier Interactive Patient Education  [employer].

## 2018-09-05 NOTE — Progress Notes (Signed)
Subjective:    Chief Complaint: Follow-up   HPI:  1. Essential hypertension  -checks BP at home -averages 140/60s -no CP/HA/SOB BP Readings from Last 3 Encounters:  09/05/18 138/76  05/26/18 131/74  03/11/18 (!) 124/59     2. Chronic asthma without complication, unspecified asthma severity, unspecified whether persistent  -no fare ups  3. Gastroesophageal reflux disease without esophagitis  -no issues  4. Benign prostatic hyperplasia, unspecified whether lower urinary tract symptoms present  -gets up at night to pee, Flomax is helping -no issues with starting/stopping stream -taking lasix at night, counseled to take during day to avoid nocturia  5. GAD (generalized anxiety disorder)  -pretty bad, per patient, since wife died in Jan 25, 2017 GAD 7 : Generalized Anxiety Score 09/05/2018 04/23/2016  Nervous, Anxious, on Edge 1 1  Control/stop worrying 2 1  Worry too much - different things 0 1  Trouble relaxing 0 1  Restless 0 1  Easily annoyed or irritable 0 0  Afraid - awful might happen 0 1  Total GAD 7 Score 3 6  Anxiety Difficulty Not difficult at all Somewhat difficult      6. Hyperlipidemia, unspecified hyperlipidemia type  -does not watch diet -no myalgias from Zocor  7. BMI 31.0-31.9,adult  -does not exercise    Outpatient Encounter Medications as of 09/05/2018  Medication Sig  . ALPRAZolam (XANAX) 1 MG tablet Take 1 tablet (1 mg total) by mouth 2 (two) times daily as needed.  Marland Kitchen aspirin 81 MG tablet Take 81 mg by mouth daily.  Marland Kitchen escitalopram (LEXAPRO) 10 MG tablet TAKE 2 TABLETS BY MOUTH  DAILY  . fish oil-omega-3 fatty acids 1000 MG capsule Take 2 g by mouth daily.  . furosemide (LASIX) 20 MG tablet TAKE 1 TABLET BY MOUTH  DAILY  . meclizine (ANTIVERT) 25 MG tablet TAKE 1 TABLET BY MOUTH THREE TIMES DAILY AS NEEDED FOR  DIZZINESS  . meloxicam (MOBIC) 7.5 MG tablet TAKE 1 TABLET BY MOUTH ONCE DAILY  . simvastatin (ZOCOR) 40 MG tablet TAKE 1 TABLET BY  MOUTH AT  BEDTIME  . tamsulosin (FLOMAX) 0.4 MG CAPS capsule TAKE 1 CAPSULE BY MOUTH  DAILY AFTER SUPPER   No facility-administered encounter medications on file as of 09/05/2018.      New complaints: Complaints of left sided back pain and sciatic pain. 10/10 when pain occurs, pain happens randomly while sitting every morning, has to sleep in recliner because of the pain, sitting on pillow makes pain better, riding in car long distances makes pain worse. Has not tried rest, ice, & is taking Aleve daily & not helping as it used to. Chiropractic care helped 10 years ago with pain, but has not been back since.  Social history: Wife died Jan 25, 2017, he was her caregiver    Review of Systems  Constitutional: Negative for activity change, appetite change, chills, fatigue, fever and unexpected weight change.  HENT: Negative for congestion, ear pain, rhinorrhea, sinus pressure, sinus pain and sore throat.   Eyes: Negative for pain, redness and visual disturbance.  Respiratory: Negative for cough, chest tightness, shortness of breath and wheezing.   Cardiovascular: Negative for chest pain, palpitations and leg swelling.  Gastrointestinal: Positive for constipation. Negative for abdominal pain, diarrhea, nausea and vomiting.  Endocrine: Negative for cold intolerance, heat intolerance, polydipsia, polyphagia and polyuria.  Genitourinary: Negative for difficulty urinating, dysuria and urgency.  Musculoskeletal: Positive for back pain. Negative for arthralgias, gait problem, joint swelling and myalgias.  Skin: Negative  for rash and wound.  Allergic/Immunologic: Negative for environmental allergies and food allergies.  Neurological: Negative for dizziness, tremors, weakness and numbness.  Hematological: Does not bruise/bleed easily.  Psychiatric/Behavioral: Negative for behavioral problems, confusion, decreased concentration, sleep disturbance and suicidal ideas. The patient is not nervous/anxious.         Objective:   Physical Exam  Constitutional: He is oriented to person, place, and time. He appears well-developed and well-nourished.  HENT:  Head: Normocephalic and atraumatic.  Right Ear: External ear normal.  Left Ear: External ear normal.  Nose: Nose normal.  Mouth/Throat: Uvula is midline, oropharynx is clear and moist and mucous membranes are normal. No oropharyngeal exudate.  Blood blister on right lower lip, has been present since 1960's. Patient said lesion has not changed since then  Eyes: Pupils are equal, round, and reactive to light. Conjunctivae and EOM are normal.  Neck: Normal range of motion. Neck supple. No thyromegaly present.  Cardiovascular: Normal rate, regular rhythm, normal heart sounds and intact distal pulses.  Pulmonary/Chest: Effort normal and breath sounds normal.  Abdominal: Soft. Bowel sounds are normal.  Musculoskeletal: Normal range of motion.       Lumbar back: He exhibits pain (straight leg test positive on left leg).  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 He displays normal reflexes. No cranial nerve deficit.  Skin: Skin is warm and dry.  Psychiatric: He has a normal mood and affect. His behavior is normal. Judgment and thought content normal.  Nursing note and vitals reviewed.   BP 138/76   Pulse (!) 56   Temp (!) 97 F (36.1 C)   Ht _0  (1.753 m)   Wt 203 lb (92.1 kg)   BMI 29.98 kg/m      Assessment & Plan:  Francisco [name] comes in today with chief complaint of Follow-up   Diagnosis and orders addressed:  1. Essential hypertension - CMP14+EGFR - furosemide (LASIX) 20 MG tablet; Take 1 tablet (20 mg total) by mouth daily.  Dispense: 90 tablet; Refill: 1 -continue to check blood pressures -take lasix in the AM  2. Chronic asthma without complication, unspecified asthma severity, unspecified whether persistent -avoid  triggers  3. Gastroesophageal reflux disease without esophagitis -avoid spicy foods -stop eating 2-3 hours before bedtime  4. Benign prostatic hyperplasia, unspecified whether lower urinary tract symptoms present - tamsulosin (FLOMAX) 0.4 MG CAPS capsule; Take 1 capsule (0.4 mg total) by mouth daily.  Dispense: 90 capsule; Refill: 1  5. GAD (generalized anxiety disorder) -split xanax pills in half and take 4 times daily - escitalopram (LEXAPRO) 10 MG tablet; Take 2 tablets (20 mg total) by mouth daily.  Dispense: 180 tablet; Refill: 1 - ALPRAZolam (XANAX) 1 MG tablet; Take 1 tablet (1 mg total) by mouth 2 (two) times daily as needed.  Dispense: 60 tablet; Refill: 2  6. Hyperlipidemia, unspecified hyperlipidemia type -lot fat diet - Lipid panel - simvastatin (ZOCOR) 40 MG tablet; Take 1 tablet (40 mg total) by mouth at bedtime.  Dispense: 90 tablet; Refill: 1  7. BMI 31.0-31.9,adult -encouraged dietary changes and exercise as able  8. Chronic midline low back pain with left-sided sciatica - MR Lumbar Spine Wo Contrast; Future -rest, ice, do not take ibuprofen/aleve while taking Mobic -instructed to call 911 if symptoms of sudden loss of bowel/bladder control, sudden loss of sensation and numbness in legs  9. Urinary hesitancy - tamsulosin (FLOMAX)  0.4 MG CAPS capsule; Take 1 capsule (0.4 mg total) by mouth daily.  Dispense: 90 capsule; Refill: 1  10. Vertigo - meclizine (ANTIVERT) 25 MG tablet; TAKE 1 TABLET BY MOUTH THREE TIMES DAILY AS NEEDED FOR  DIZZINESS  Dispense: 30 tablet; Refill: 2   Labs pending Health Maintenance reviewed Diet and exercise encouraged  Follow up plan: 3 months   [name], FNP

## 2018-09-06 LAB — CMP14+EGFR
A/G RATIO: 2.8 — AB (ref 1.2–2.2)
ALBUMIN: 4.5 g/dL (ref 3.5–4.8)
ALT: 14 IU/L (ref 0–44)
AST: 17 IU/L (ref 0–40)
Alkaline Phosphatase: 47 IU/L (ref 39–117)
BILIRUBIN TOTAL: 0.7 mg/dL (ref 0.0–1.2)
BUN / CREAT RATIO: 14 (ref 10–24)
BUN: 17 mg/dL (ref 8–27)
CHLORIDE: 101 mmol/L (ref 96–106)
CO2: 24 mmol/L (ref 20–29)
Calcium: 9.1 mg/dL (ref 8.6–10.2)
Creatinine, Ser: 1.19 mg/dL (ref 0.76–1.27)
GFR calc non Af Amer: 59 mL/min/{1.73_m2} — ABNORMAL LOW (ref >59)
GFR, EST AFRICAN AMERICAN: 68 mL/min/{1.73_m2} (ref >59)
GLUCOSE: 112 mg/dL — AB (ref 65–99)
Globulin, Total: 1.6 g/dL (ref 1.5–4.5)
Potassium: 4.4 mmol/L (ref 3.5–5.2)
Sodium: 143 mmol/L (ref 134–144)
Total Protein: 6.1 g/dL (ref 6.0–8.5)

## 2018-09-06 LAB — LIPID PANEL
CHOLESTEROL TOTAL: 150 mg/dL (ref 100–199)
Chol/HDL Ratio: 3.7 ratio (ref 0.0–5.0)
HDL: 41 mg/dL (ref >39)
LDL Calculated: 68 mg/dL (ref 0–99)
Triglycerides: 204 mg/dL — ABNORMAL HIGH (ref 0–149)
VLDL Cholesterol Cal: 41 mg/dL — ABNORMAL HIGH (ref 5–40)

## 2018-09-07 ENCOUNTER — Other Ambulatory Visit: Payer: Self-pay

## 2018-09-07 MED ORDER — MELOXICAM 7.5 MG PO TABS: 7.5000 mg | ORAL_TABLET | Freq: Every day | ORAL | 0 refills | Status: DC

## 2018-09-08 ENCOUNTER — Other Ambulatory Visit: Payer: Self-pay | Admitting: *Deleted

## 2018-09-08 DIAGNOSIS — R42 Dizziness and giddiness: Secondary | ICD-10-CM

## 2018-09-08 DIAGNOSIS — F411 Generalized anxiety disorder: Secondary | ICD-10-CM

## 2018-09-12 ENCOUNTER — Ambulatory Visit (HOSPITAL_COMMUNITY)
Admission: RE | Admit: 2018-09-12 | Discharge: 2018-09-12 | Disposition: A | Discharge status: Home / Self Care | Payer: Medicare Other | Source: Ambulatory Visit | Attending: Nurse Practitioner | Admitting: Nurse Practitioner

## 2018-09-12 DIAGNOSIS — M48061 Spinal stenosis, lumbar region without neurogenic claudication: Secondary | ICD-10-CM | POA: Diagnosis not present

## 2018-09-12 DIAGNOSIS — M5136 Other intervertebral disc degeneration, lumbar region: Secondary | ICD-10-CM | POA: Diagnosis not present

## 2018-09-12 DIAGNOSIS — G8929 Other chronic pain: Secondary | ICD-10-CM | POA: Insufficient documentation

## 2018-09-12 DIAGNOSIS — M5442 Lumbago with sciatica, left side: Secondary | ICD-10-CM | POA: Diagnosis not present

## 2018-09-12 DIAGNOSIS — M545 Low back pain: Secondary | ICD-10-CM | POA: Diagnosis not present

## 2018-09-14 ENCOUNTER — Other Ambulatory Visit: Payer: Self-pay | Admitting: *Deleted

## 2018-09-14 DIAGNOSIS — M545 Low back pain, unspecified: Secondary | ICD-10-CM

## 2018-09-21 ENCOUNTER — Ambulatory Visit: Payer: Medicare Other | Admitting: *Deleted

## 2018-09-22 ENCOUNTER — Encounter: Payer: Self-pay | Admitting: *Deleted

## 2018-09-22 ENCOUNTER — Ambulatory Visit (INDEPENDENT_AMBULATORY_CARE_PROVIDER_SITE_OTHER): Payer: Medicare Other | Admitting: *Deleted

## 2018-09-22 VITALS — BP 135/69 | HR 59 | Ht 67.5 in | Wt 204.0 lb

## 2018-09-22 DIAGNOSIS — Z0000 Encounter for general adult medical examination without abnormal findings: Secondary | ICD-10-CM | POA: Diagnosis not present

## 2018-09-22 NOTE — Progress Notes (Addendum)
Subjective:   Francisco [name] is a 77 y.o. male who presents for a Medicare Annual Wellness Visit. Francisco [name] lives at home alone. His wife passed in February of last year. He has a small dog and enjoys their company. He has two adult daughters, 4 grandchildren and 3 great grandchildren. One of his grandsons and two great grandchildren live next door. He enjoys woodworking but is cautious because he amputated his left index finger with a table saw a couple of years ago.    Review of Systems    Patient reports that his overall health is unchanged compared to last year.  Cardiac Risk Factors include: advanced age (>13men, >70 women);dyslipidemia;male gender   All other systems negative       Current Medications (verified) Outpatient Encounter Medications as of 09/22/2018  Medication Sig  . ALPRAZolam (XANAX) 1 MG tablet Take 1 tablet (1 mg total) by mouth 2 (two) times daily as needed.  Marland Kitchen aspirin 81 MG tablet Take 81 mg by mouth daily.  Marland Kitchen escitalopram (LEXAPRO) 10 MG tablet Take 2 tablets (20 mg total) by mouth daily.  . fish oil-omega-3 fatty acids 1000 MG capsule Take 2 g by mouth daily.  . furosemide (LASIX) 20 MG tablet Take 1 tablet (20 mg total) by mouth daily.  . meclizine (ANTIVERT) 25 MG tablet TAKE 1 TABLET BY MOUTH THREE TIMES DAILY AS NEEDED FOR  DIZZINESS  . meloxicam (MOBIC) 7.5 MG tablet Take 1 tablet (7.5 mg total) by mouth daily.  . simvastatin (ZOCOR) 40 MG tablet Take 1 tablet (40 mg total) by mouth at bedtime.  . tamsulosin (FLOMAX) 0.4 MG CAPS capsule Take 1 capsule (0.4 mg total) by mouth daily.   No facility-administered encounter medications on file as of 09/22/2018.     Allergies (verified) Crestor [rosuvastatin]; Lipitor [atorvastatin]; Penicillins; and Symbicort [budesonide-formoterol fumarate]   History: Past Medical History:  Diagnosis Date  . Anxiety   . GERD (gastroesophageal reflux disease)   . Hypertension    Past Surgical History:    Procedure Laterality Date  . AMPUTATION Left 08/05/2016   Procedure: REVISION AMPUTATION LEFT INDEX FINGER REPAIR LACERATION LEFT THUMB;  Surgeon: [name], MD;  Location: [facility];  Service: Orthopedics;  Laterality: Left;  . SPINE SURGERY     Family History  Problem Relation Age of Onset  . Healthy Mother   . Transient ischemic attack Father   . Rheum arthritis Daughter    Social History   Socioeconomic History  . Marital status: Widowed    Spouse name: Not on file  . Number of children: 2  . Years of education: Not on file  . Highest education level: Not on file  Occupational History  . Occupation: Retired    Fish farm manager: [employer]  Social Needs  . Financial resource strain: Not on file  . Food insecurity:    Worry: Not on file    Inability: Not on file  . Transportation needs:    Medical: Not on file    Non-medical: Not on file  Tobacco Use  . Smoking status: Former Smoker    Last attempt to quit: 02/23/1972    Years since quitting: 46.6  . Smokeless tobacco: Never Used  Substance and Sexual Activity  . Alcohol use: Yes    Alcohol/week: 1.0 standard drinks    Types: 1 Standard drinks or equivalent per week    Comment: occasional beer  . Drug use: No  . Sexual activity: Not Currently  Lifestyle  . Physical activity:    Days per week: Not on file    Minutes per session: Not on file  . Stress: Not on file  Relationships  . Social connections:    Talks on phone: Not on file    Gets together: Not on file    Attends religious service: Not on file    Active member of club or organization: Not on file    Attends meetings of clubs or organizations: Not on file    Relationship status: Not on file  Other Topics Concern  . Not on file  Social History Narrative  . Not on file    Tobacco Use No.  Clinical Intake:     Pain : 0-10 Pain Score: 6  Pain Type: Chronic pain Pain Location: Back Pain Orientation: Left Pain Radiating Towards: Left leg sciatica Pain  Descriptors / Indicators: Aching Pain Onset: More than a month ago Pain Frequency: Constant Pain Relieving Factors: sleeps in recliner. Seeing neurosurgeon.  Effect of Pain on Daily Activities: moderate  Pain Relieving Factors: sleeps in recliner. Seeing neurosurgeon.   Nutritional Status: (drinks 2 or 3 diet orange crush a day. Drinks some water but could drink more. ) Nutritional Risks: None Diabetes: No  How often do you need to have someone help you when you read instructions, pamphlets, or other written materials from your doctor or pharmacy?: 1 - Never What is the last grade level you completed in school?: 10  Interpreter Needed?: No  Information entered by :: Chong Sicilian, RN  Activities of Daily Living In your present state of health, do you have any difficulty performing the following activities: 09/22/2018  Hearing? N  Vision? N  Difficulty concentrating or making decisions? N  Walking or climbing stairs? N  Dressing or bathing? N  Doing errands, shopping? N  Preparing Food and eating ? N  Using the Toilet? N  In the past six months, have you accidently leaked urine? N  Do you have problems with loss of bowel control? N  Managing your Medications? N  Managing your Finances? N  Housekeeping or managing your Housekeeping? N  Some recent data might be hidden    Exercise Current Exercise Habits: The patient does not participate in regular exercise at present, Exercise limited by: neurologic condition(s);orthopedic condition(s)    Depression Screen PHQ 2/9 Scores 09/22/2018 09/05/2018 05/26/2018 03/11/2018  PHQ - 2 Score 1 1 0 0  PHQ- 9 Score - - - -     Fall Risk Fall Risk  09/22/2018 09/05/2018 05/26/2018 03/11/2018 02/09/2018  Falls in the past year? 0 No No No No  Number falls in past yr: 0 - - - -  Injury with Fall? 0 - - - -  Comment - - - - -  Risk for fall due to : History of fall(s) - - - -  Risk for fall due to: Comment slipped on wet deck - - - -    Follow up Falls prevention discussed - - - -   Patient Care Team: [name], FNP as PCP - General (Nurse Practitioner)  Hospitalizations, surgeries, and ER visits in previous 12 months No hospitalizations, ER visits, or surgeries this past year.  Objective:    Today's Vitals   09/22/18 1018 09/22/18 1033  BP: 135/69   Pulse: (!) 59   Weight: 204 lb (92.5 kg)   Height: 5' 7.5" (1.715 m)   PainSc:  6    Body mass index is 31.48  kg/m.  Advanced Directives 09/22/2018 09/14/2017 08/05/2016 08/05/2016  Does Patient Have a Medical Advance Directive? Yes No Yes Yes  Type of Paramedic of [place];Living will - [place];Living will -  Does patient want to make changes to medical advance directive? No - Patient declined - - -  Copy of [place] in Chart? No - copy requested - No - copy requested -  Would patient like information on creating a medical advance directive? - Yes (MAU/Ambulatory/Procedural Areas - Information given) - -    Hearing/Vision  No hearing or vision deficits noted during visit.   Cognitive Function: MMSE - Mini Mental State Exam 09/14/2017  Orientation to time 3  Orientation to Place 5  Registration 3  Attention/ Calculation 5  Recall 2  Language- name 2 objects 2  Language- repeat 1  Language- follow 3 step command 3  Language- read & follow direction 1  Write a sentence 1  Copy design 0  Total score 26     6CIT Screen 09/22/2018  What Year? 0 points  What month? 0 points  What time? 0 points  Count back from 20 0 points  Months in reverse 2 points  Repeat phrase 0 points  Total Score 2   Normal Cognitive Function Screening: Yes    Immunizations and Health Maintenance Immunization History  Administered Date(s) Administered  . Influenza, High Dose Seasonal PF 09/10/2016, 09/02/2018  . Influenza,inj,Quad PF,6+ Mos 08/28/2013, 09/03/2014, 08/09/2017  .  Influenza-Unspecified 12/03/2015  . Pneumococcal Conjugate-13 10/18/2013  . Pneumococcal Polysaccharide-23 06/16/2015  . Tdap 08/05/2016   There are no preventive care reminders to display for this patient. Health Maintenance  Topic Date Due  . TETANUS/TDAP  08/05/2026  . INFLUENZA VACCINE  Completed  . PNA vac Low Risk Adult  Completed        Assessment:   This is a routine wellness examination for Herman.      Plan:    Goals    . DIET - DECREASE SODA OR JUICE INTAKE     Decrease soda. If you're going to have one I recommend drinking it at one sitting and then sipping on water throughout the day.     Marland Kitchen DIET - INCREASE WATER INTAKE    . Exercise 150 minutes per week (moderate activity)        Health Maintenance Recommendations: None at this time  Additional Screening Recommendations: Lung: Low Dose CT Chest recommended if Age 68-80 years, 30 pack-year currently smoking OR have quit w/in 15years. Patient does not qualify. Hepatitis C Screening recommended: no  Keep f/u with [name], FNP and any other specialty appointments you may have Continue current medications Move carefully to avoid falls. Use assistive devices like a cane or walker if needed. Aim for at least 150 minutes of moderate activity a week. This can be chair exercises if necessary. Reading or puzzles are a good way to exercise your brain Stay connected with friends and family. Social connections are beneficial to your emotional and mental health.   I have personally reviewed and noted the following in the patient's chart:   . Medical and social history . Use of alcohol, tobacco or illicit drugs  . Current medications and supplements . Functional ability and status . Nutritional status . Physical activity . Advanced directives . List of other physicians . Hospitalizations, surgeries, and ER visits in previous 12 months . Vitals . Screenings to include cognitive, depression, and  falls .  Referrals and appointments  In addition, I have reviewed and discussed with patient certain preventive protocols, quality metrics, and best practice recommendations. A written personalized care plan for preventive services as well as general preventive health recommendations were provided to patient.     Chong Sicilian, RN   09/22/2018   I have reviewed and agree with the above AWV documentation.   [name], FNP

## 2018-09-22 NOTE — Patient Instructions (Signed)
  Mr. Wissmann , Thank you for taking time to come for your Medicare Wellness Visit. I appreciate your ongoing commitment to your health goals. Please review the following plan we discussed and let me know if I can assist you in the future.   These are the goals we discussed: Goals    . DIET - DECREASE SODA OR JUICE INTAKE     Decrease soda. If you're going to have one I recommend drinking it at one sitting and then sipping on water throughout the day.     Marland Kitchen DIET - INCREASE WATER INTAKE    . Exercise 150 minutes per week (moderate activity)       This is a list of the screening recommended for you and due dates:  Health Maintenance  Topic Date Due  . Tetanus Vaccine  08/05/2026  . Flu Shot  Completed  . Pneumonia vaccines  Completed

## 2018-09-28 ENCOUNTER — Ambulatory Visit (INDEPENDENT_AMBULATORY_CARE_PROVIDER_SITE_OTHER): Payer: Medicare Other | Admitting: Orthopaedic Surgery

## 2018-09-28 ENCOUNTER — Encounter (INDEPENDENT_AMBULATORY_CARE_PROVIDER_SITE_OTHER): Payer: Self-pay | Admitting: Orthopaedic Surgery

## 2018-09-28 ENCOUNTER — Ambulatory Visit (INDEPENDENT_AMBULATORY_CARE_PROVIDER_SITE_OTHER): Payer: Self-pay

## 2018-09-28 VITALS — BP 157/80 | HR 57 | Ht 67.5 in | Wt 204.0 lb

## 2018-09-28 DIAGNOSIS — M545 Low back pain, unspecified: Secondary | ICD-10-CM

## 2018-09-28 DIAGNOSIS — G8929 Other chronic pain: Secondary | ICD-10-CM

## 2018-09-28 NOTE — Addendum Note (Signed)
Addended by: Marybelle Killings on: 09/28/2018 10:54 AM   Modules accepted: Level of Service

## 2018-09-28 NOTE — Progress Notes (Addendum)
Office Visit Note   Patient: Francisco [name]           Date of Birth: 04-30-1941           MRN: [id number] Visit Date: 09/28/2018              Requested by: [name], [address] [place], [place] [address] PCP: [name], FNP   Assessment & Plan: Visit Diagnoses:  1. Chronic bilateral low back pain, unspecified whether sciatica present     Plan: We will set him up for single epidural injection Dr. Ernestina Patches office follow-up 2 months.  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  He has some facet degenerative changes and got an adjustable bed but states she still cannot sleep in the bed and has sleep in a recliner.  I will check him back in follow-up in 2 months.  Follow-Up Instructions: Return in about 2 months (around 11/28/2018).   Orders:  Orders Placed This Encounter  Procedures  . XR Lumbar Spine 2-3 Views   No orders of the defined types were placed in this encounter.     Procedures: No procedures performed   Clinical Data: No additional findings.   Subjective: Chief Complaint  Patient presents with  . Lower Back - Pain    HPI 77 year old male with progressive problems with back pain and originally left leg pain now right leg pain.  He had a fall several months ago and is had an MRI scan on 09/12/2018 that showed moderate spinal stenosis at the L3-4 level.  No acute disc herniation.  He is had to sleep in recliner for the last several months.  He has difficulty getting from sitting to standing has trouble reaching upright position once he is been up and walking for little while he states he starts feeling a little bit better and is able to ambulate a little bit better.  MRI scan showed some facet arthropathy mild narrowing at L4-5 and moderate narrowing at L3-4.  He did have some foraminal stenosis at L5-S1 without severe compression.  He denies bowel or bladder symptoms.  Increased pain after riding in a car difficulty getting  out of the vehicle.  Patient here after having previous MRI and is here to consider surgical intervention.  Review of Systems positive previous cervical fusion Dr. Arnoldo Morale.  Asthma, prostatic hypertrophy.  Anxiety, GERD, hyperlipidemia, vertigo and hypertension.  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   Objective: Vital Signs: BP (!) 157/80   Pulse (!) 57   Ht 5' 7.5" (1.715 m)   Wt 204 lb (92.5 kg)   BMI 31.48 kg/m   Physical Exam  Constitutional: He is oriented to person, place, and time. He appears well-developed and well-nourished.  HENT:  Head: Normocephalic and atraumatic.  Eyes: Pupils are equal, round, and reactive to light. EOM are normal.  Neck: No tracheal deviation present. No thyromegaly present.  Cardiovascular: Normal rate.  Pulmonary/Chest: Effort normal. He has no wheezes.  Abdominal: Soft. Bowel sounds are normal.  Neurological: He is alert and oriented to person, place, and time.  Skin: Skin is warm and dry. Capillary refill takes less than 2 seconds.  Psychiatric: He has a normal mood and affect. His behavior is normal. Judgment and thought content normal.    Ortho Exam negative straight leg raising.  Normal hip range of motion negative logroll.  Distal pulses are intact anterior tib gastrocsoleus is intact without weakness.  No tenderness  over the lumbar spine trochanters are minimally tender.  Specialty Comments:  No specialty comments available.  Imaging: CLINICAL DATA:  77 year old male with lumbar back pain radiating down both legs for 8 months with no known injury.  EXAM: MRI LUMBAR SPINE WITHOUT CONTRAST  TECHNIQUE: Multiplanar, multisequence MR imaging of the lumbar spine was performed. No intravenous contrast was administered.  COMPARISON:  [facility] 10/18/2013.  FINDINGS: Segmentation:  Lumbar segmentation appears normal on the comparison.  Alignment: Preserved lumbar lordosis. There is subtle anterolisthesis of L5 on  S1.  Vertebrae: No marrow edema or evidence of acute osseous abnormality. Visualized bone marrow signal is within normal limits. Intact visible sacrum and SI joints.  Conus medullaris and cauda equina: Conus extends to the L1 level. No lower spinal cord or conus signal abnormality.  Paraspinal and other soft tissues: Negative.  Disc levels:  T11-T12: Mild disc bulge and facet hypertrophy. No stenosis. Small T11 nerve root diverticula (normal variant).  T12-L1:  Negative.  L1-L2:  Negative.  L2-L3:  Negative.  L3-L4: Disc space loss and circumferential disc bulge eccentric to the right. Mild to moderate facet and mild ligament flavum hypertrophy. Mild epidural lipomatosis. Trace increased signal in the interspinous ligament appears degenerative on series 5, image 9.  Moderate spinal stenosis with mild right lateral recess stenosis (descending right L4 nerve level). Mild left and moderate right L3 neural foraminal stenosis.  L4-L5: Circumferential disc bulge. Mild to moderate facet and ligament flavum hypertrophy. Less epidural fat. Borderline to mild spinal stenosis. No convincing lateral recess stenosis. Moderate left greater than right L4 neural foraminal stenosis.  L5-S1: Mild circumferential disc bulge. Mild facet hypertrophy. Mild epidural lipomatosis is responsible for effacing CSF from the thecal sac at this level. Moderate left greater than right L5 foraminal stenosis.  IMPRESSION: 1. Lower lumbar disc and posterior element degeneration. No acute osseous abnormality identified. 2. Moderate multifactorial spinal stenosis at L3-L4 with mild right L4 nerve level lateral recess stenosis and up to moderate L3 foraminal stenosis greater on the right. 3. Moderate left greater than right L4 and L5 neural foraminal stenosis at L4-L5, L5-S1 respectively.   Electronically Signed   By: Genevie Ann M.D.   On: 09/12/2018 15:35   PMFS History: Patient  Active Problem List   Diagnosis Date Noted  . Vertigo 10/13/2017  . BMI 31.0-31.9,adult 09/06/2015  . BPH (benign prostatic hyperplasia) 08/06/2015  . Asthma, chronic 04/04/2015  . Hypertension 04/12/2013  . GAD (generalized anxiety disorder) 04/12/2013  . GERD (gastroesophageal reflux disease) 04/12/2013  . Hyperlipidemia 04/12/2013   Past Medical History:  Diagnosis Date  . Anxiety   . GERD (gastroesophageal reflux disease)   . Hypertension     Family History  Problem Relation Age of Onset  . Healthy Mother   . Transient ischemic attack Father   . Rheum arthritis Daughter     Past Surgical History:  Procedure Laterality Date  . 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 SPINE SURGERY     Social History   Occupational History  . Occupation: Retired    Fish farm manager: [employer]  Tobacco Use  . Smoking status: Former Smoker    Last attempt to quit: 02/23/1972    Years since quitting: 46.6  . Smokeless tobacco: Never Used  Substance and Sexual Activity  . Alcohol use: Yes    Alcohol/week: 1.0  standard drinks    Types: 1 Standard drinks or equivalent per week    Comment: occasional beer  . Drug use: No  . Sexual activity: Not Currently

## 2018-09-28 NOTE — Addendum Note (Signed)
Addended by: Meyer Cory on: 09/28/2018 11:05 AM   Modules accepted: Orders

## 2018-10-10 ENCOUNTER — Encounter (INDEPENDENT_AMBULATORY_CARE_PROVIDER_SITE_OTHER): Payer: Self-pay | Admitting: Physical Medicine and Rehabilitation

## 2018-10-10 ENCOUNTER — Ambulatory Visit (INDEPENDENT_AMBULATORY_CARE_PROVIDER_SITE_OTHER): Payer: Medicare Other | Admitting: Physical Medicine and Rehabilitation

## 2018-10-10 ENCOUNTER — Ambulatory Visit (INDEPENDENT_AMBULATORY_CARE_PROVIDER_SITE_OTHER): Payer: Self-pay

## 2018-10-10 VITALS — BP 146/90 | HR 60 | Temp 97.6°F

## 2018-10-10 DIAGNOSIS — M5416 Radiculopathy, lumbar region: Secondary | ICD-10-CM | POA: Diagnosis not present

## 2018-10-10 MED ORDER — METHYLPREDNISOLONE ACETATE 80 MG/ML IJ SUSP
80.0000 mg | Freq: Once | INTRAMUSCULAR | Status: AC
  Administered 2018-10-10: 80 mg

## 2018-10-10 NOTE — Patient Instructions (Signed)

## 2018-10-10 NOTE — Progress Notes (Signed)
.     Numeric Pain Rating Scale and Functional Assessment Average Pain 10   In the last MONTH (on 0-10 scale) has pain interfered with the following?  1. General activity like being  able to carry out your everyday physical activities such as walking, climbing stairs, carrying groceries, or moving a chair?  Rating(7)   +Driver, -BT, -Dye Allergies.  

## 2018-10-10 NOTE — Procedures (Signed)
Lumbar Epidural Steroid Injection - Interlaminar Approach with Fluoroscopic Guidance  Patient: Francisco [name]      Date of Birth: Sep 08, 1941 MRN: [id number] PCP: [name], FNP      Visit Date: 10/10/2018   Universal Protocol:     Consent Given By: the patient  Position: PRONE  Additional Comments: Vital signs were monitored before and after the procedure. Patient was prepped and draped in the usual sterile fashion. The correct patient, procedure, and site was verified.   Injection Procedure Details:  Procedure Site One Meds Administered:  Meds ordered this encounter  Medications  . methylPREDNISolone acetate (DEPO-MEDROL) injection 80 mg     Laterality: Bilateral  Location/Site:  L3-L4  Needle size: 20 G  Needle type: Tuohy  Needle Placement: Paramedian epidural  Findings:   -Comments: Excellent flow of contrast into the epidural space.  Procedure Details: Using a paramedian approach from the side mentioned above, the region overlying the inferior lamina was localized under fluoroscopic visualization and the soft tissues overlying this structure were infiltrated with 4 ml. of 1% Lidocaine without Epinephrine. The Tuohy needle was inserted into the epidural space using a paramedian approach.   The epidural space was localized using loss of resistance along with lateral and bi-planar fluoroscopic views.  After negative aspirate for air, blood, and CSF, a 2 ml. volume of Isovue-250 was injected into the epidural space and the flow of contrast was observed. Radiographs were obtained for documentation purposes.    The injectate was administered into the level noted above.   Additional Comments:  The patient tolerated the procedure well Dressing: Band-Aid    Post-procedure details: Patient was observed during the procedure. Post-procedure instructions were reviewed.  Patient left the clinic in stable condition.

## 2018-10-10 NOTE — Progress Notes (Signed)
Francisco [name] - 77 y.o. male MRN [id number]  Date of birth: 1941/08/12  Office Visit Note: Visit Date: 10/10/2018 PCP: [name], FNP Referred by: Hassell Done, Mary-Margaret, *  Subjective: Chief Complaint  Patient presents with  . Lower Back - Pain  . Left Leg - Pain  . Right Leg - Pain   HPI:  Francisco [name] is a 77 y.o. male who comes in today At the request of Dr. Rodell Perna for L3-4 interlaminar epidural steroid injection.  Patient reports a fall several months ago with worsening back pain since that time.  Initially had left-sided complaints in the leg now more right-sided.  Has had prior lumbar myelogram many years ago but no lumbar injections or surgery.  He has had prior ACDF by Dr. Arnoldo Morale and posterior neck surgery prior to that.  MRI does show moderate multifactorial stenosis at L3-4 with multilevel facet arthropathy at L3-4 and L4-5.  Depending on relief would look at facet joint block.  May be surgical candidate for decompression at L3-4.  ROS Otherwise per HPI.  Assessment & Plan: Visit Diagnoses:  1. Lumbar radiculopathy     Plan: No additional findings.   Meds & Orders:  Meds ordered this encounter  Medications  . methylPREDNISolone acetate (DEPO-MEDROL) injection 80 mg    Orders Placed This Encounter  Procedures  . XR C-ARM NO REPORT  . Epidural Steroid injection    Follow-up: Return if symptoms worsen or fail to improve, for Rodell Perna, MD as scheduled.   Procedures: No procedures performed  Lumbar Epidural Steroid Injection - Interlaminar Approach with Fluoroscopic Guidance  Patient: Francisco [name]      Date of Birth: 09/11/1941 MRN: [id number] PCP: [name], FNP      Visit Date: 10/10/2018   Universal Protocol:     Consent Given By: the patient  Position: PRONE  Additional Comments: Vital signs were monitored before and after the procedure. Patient was prepped and draped in the usual sterile fashion. The correct patient,  procedure, and site was verified.   Injection Procedure Details:  Procedure Site One Meds Administered:  Meds ordered this encounter  Medications  . methylPREDNISolone acetate (DEPO-MEDROL) injection 80 mg     Laterality: Bilateral  Location/Site:  L3-L4  Needle size: 20 G  Needle type: Tuohy  Needle Placement: Paramedian epidural  Findings:   -Comments: Excellent flow of contrast into the epidural space.  Procedure Details: Using a paramedian approach from the side mentioned above, the region overlying the inferior lamina was localized under fluoroscopic visualization and the soft tissues overlying this structure were infiltrated with 4 ml. of 1% Lidocaine without Epinephrine. The Tuohy needle was inserted into the epidural space using a paramedian approach.   The epidural space was localized using loss of resistance along with lateral and bi-planar fluoroscopic views.  After negative aspirate for air, blood, and CSF, a 2 ml. volume of Isovue-250 was injected into the epidural space and the flow of contrast was observed. Radiographs were obtained for documentation purposes.    The injectate was administered into the level noted above.   Additional Comments:  The patient tolerated the procedure well Dressing: Band-Aid    Post-procedure details: Patient was observed during the procedure. Post-procedure instructions were reviewed.  Patient left the clinic in stable condition.   Clinical History: MRI LUMBAR SPINE WITHOUT CONTRAST  TECHNIQUE: Multiplanar, multisequence MR imaging of the lumbar spine was performed. No intravenous contrast was administered.  COMPARISON:  [facility] 10/18/2013.  FINDINGS: Segmentation:  Lumbar segmentation appears normal on the comparison.  Alignment: Preserved lumbar lordosis. There is subtle anterolisthesis of L5 on S1.  Vertebrae: No marrow edema or evidence of acute osseous abnormality. Visualized bone marrow signal is within  normal limits. Intact visible sacrum and SI joints.  Conus medullaris and cauda equina: Conus extends to the L1 level. No lower spinal cord or conus signal abnormality.  Paraspinal and other soft tissues: Negative.  Disc levels:  T11-T12: Mild disc bulge and facet hypertrophy. No stenosis. Small T11 nerve root diverticula (normal variant).  T12-L1:  Negative.  L1-L2:  Negative.  L2-L3:  Negative.  L3-L4: Disc space loss and circumferential disc bulge eccentric to the right. Mild to moderate facet and mild ligament flavum hypertrophy. Mild epidural lipomatosis. Trace increased signal in the interspinous ligament appears degenerative on series 5, image 9.  Moderate spinal stenosis with mild right lateral recess stenosis (descending right L4 nerve level). Mild left and moderate right L3 neural foraminal stenosis.  L4-L5: Circumferential disc bulge. Mild to moderate facet and ligament flavum hypertrophy. Less epidural fat. Borderline to mild spinal stenosis. No convincing lateral recess stenosis. Moderate left greater than right L4 neural foraminal stenosis.  L5-S1: Mild circumferential disc bulge. Mild facet hypertrophy. Mild epidural lipomatosis is responsible for effacing CSF from the thecal sac at this level. Moderate left greater than right L5 foraminal stenosis.  IMPRESSION: 1. Lower lumbar disc and posterior element degeneration. No acute osseous abnormality identified. 2. Moderate multifactorial spinal stenosis at L3-L4 with mild right L4 nerve level lateral recess stenosis and up to moderate L3 foraminal stenosis greater on the right. 3. Moderate left greater than right L4 and L5 neural foraminal stenosis at L4-L5, L5-S1 respectively.   Electronically Signed   By: Genevie Ann M.D.   On: 09/12/2018 15:35     Objective:  VS:  HT:    WT:   BMI:     BP:(!) 146/90  HR:60bpm  TEMP:97.6 F (36.4 C)(Oral)  RESP:  Physical Exam  Ortho  Exam Imaging: Xr C-arm No Report  Result Date: 10/10/2018 Please see Notes tab for imaging impression.

## 2018-10-25 ENCOUNTER — Ambulatory Visit (INDEPENDENT_AMBULATORY_CARE_PROVIDER_SITE_OTHER): Payer: Self-pay | Admitting: Orthopaedic Surgery

## 2018-10-26 ENCOUNTER — Other Ambulatory Visit: Payer: Self-pay | Admitting: Nurse Practitioner

## 2018-12-24 ENCOUNTER — Other Ambulatory Visit: Payer: Self-pay | Admitting: Nurse Practitioner

## 2018-12-29 DIAGNOSIS — H43393 Other vitreous opacities, bilateral: Secondary | ICD-10-CM | POA: Diagnosis not present

## 2019-01-02 NOTE — Progress Notes (Signed)
[facility] Note  01/03/2019     CHIEF COMPLAINT Patient presents for Retina Evaluation   HISTORY OF PRESENT ILLNESS: Francisco [name] is a 78 y.o. male who presents to the clinic today for:   HPI    Retina Evaluation    In left eye.  Duration of 3 days.  Associated Symptoms Scalp Tenderness.  Negative for Flashes, Distortion, Pain, Photophobia, Trauma, Jaw Claudication, Fever, Fatigue, Weight Loss, Shoulder/Hip pain, Glare, Redness, Blind Spot and Floaters.  Context:  distance vision, mid-range vision and near vision.  I, the attending physician,  performed the HPI with the patient and updated documentation appropriately.          Comments    Pt presents on the referral of Dr. Maryjane Hurter for concern of CRAO OS, pt states last Thursday he woke up and noticed that if he looks out of just his left eye it looks like there is a blue Christmas tree laying down, he states he [facility] and they told him to see an eye dr, he states he had an ultrasound on his neck a few months ago which showed that one side was 90% blocked and the other side is 70% blocked, pt states he has been light headed for a few months, pt states every once in awhile he feels a sharp pain shoot from his neck up to his head, he states it only last for a few seconds, pt denies any flashes or floaters, pt denies eye pain, pt denies the use of eye drops, pt endorses having HTN, but states the past 2 nights he has check his blood pressure and thought it was too low, pt denies having diabetes       Last edited by Bernarda Caffey, MD on 01/03/2019 12:40 PM. (History)    pt states his family dr ordered an ultrasound of his neck back in April 2019, he states they called him with the results and said one side was 90% blocked and one side is 70% blocked, he states he was not told which side was which, pt states they never followed up after the ultrasound, pt states Dr. Marin Comment told him he might  not be getting enough blood to his brain bc of the blockages, pt states he has been light headed for about the past month, he states every once in awhile he feels a sharp pain shoot from his neck up to his head, he says it feels like he is being stabbed, pt states when his right eye is closed he sees a blue christmas tree laying just below center in his left eye  Referring physician: Marin Comment [address] [place], [place], [place] [phone]  HISTORICAL INFORMATION:   Selected notes from the [facility] Referred by Dr. Maryjane Hurter for concern of CRAO LEE: (M.Le) [BCVA: OD: OS:] Ocular Hx- PMH-anxiety, HTN    CURRENT MEDICATIONS: Current Outpatient Medications (Ophthalmic Drugs)  Medication Sig  . brimonidine (ALPHAGAN) 0.2 % ophthalmic solution Place 1 drop into the left eye 2 (two) times daily.   No current facility-administered medications for this visit.  (Ophthalmic Drugs)   Current Outpatient Medications (Other)  Medication Sig  . ALPRAZolam (XANAX) 1 MG tablet Take 1 tablet (1 mg total) by mouth 2 (two) times daily as needed.  Marland Kitchen aspirin 81 MG tablet Take 81 mg by mouth daily.  Marland Kitchen escitalopram (LEXAPRO) 10 MG tablet Take 2 tablets (20  mg total) by mouth daily.  . fish oil-omega-3 fatty acids 1000 MG capsule Take 2 g by mouth daily.  . furosemide (LASIX) 20 MG tablet Take 1 tablet (20 mg total) by mouth daily.  . meclizine (ANTIVERT) 25 MG tablet TAKE 1 TABLET BY MOUTH THREE TIMES DAILY AS NEEDED FOR  DIZZINESS  . meloxicam (MOBIC) 7.5 MG tablet TAKE 1 TABLET BY MOUTH ONCE DAILY  . simvastatin (ZOCOR) 40 MG tablet Take 1 tablet (40 mg total) by mouth at bedtime.  . tamsulosin (FLOMAX) 0.4 MG CAPS capsule Take 1 capsule (0.4 mg total) by mouth daily.   No current facility-administered medications for this visit.  (Other)      REVIEW OF SYSTEMS: ROS    Positive for: Cardiovascular, Eyes   Negative for: Constitutional, Gastrointestinal, Neurological, Skin, Genitourinary,  Musculoskeletal, HENT, Endocrine, Respiratory, Psychiatric, Allergic/Imm, Heme/Lymph   Last edited by Debbrah Alar, COT on 01/03/2019  8:10 AM. (History)       ALLERGIES Allergies  Allergen Reactions  . Crestor [Rosuvastatin]   . Lipitor [Atorvastatin]   . Penicillins     REACTION: swelling/hives Has patient had a PCN reaction causing immediate rash, facial/tongue/throat swelling, SOB or lightheadedness with hypotension:yes Has patient had a PCN reaction causing severe rash involving mucus membranes or skin necrosis: Yes Has patient had a PCN reaction that required hospitalization No Has patient had a PCN reaction occurring within the last 10 years: No If all of the above answers are "NO", then may proceed with Cephalosporin use.   . Symbicort [Budesonide-Formoterol Fumarate]     Pain  Behind ribs    PAST MEDICAL HISTORY Past Medical History:  Diagnosis Date  . Anxiety   . GERD (gastroesophageal reflux disease)   . Hypertension    Past Surgical History:  Procedure Laterality Date  . AMPUTATION Left 08/05/2016   Procedure: REVISION AMPUTATION LEFT INDEX FINGER REPAIR LACERATION LEFT THUMB;  Surgeon: [name], MD;  Location: [facility];  Service: Orthopedics;  Laterality: Left;  . SPINE SURGERY      FAMILY HISTORY Family History  Problem Relation Age of Onset  . Healthy Mother   . Transient ischemic attack Father   . Rheum arthritis Daughter   . Amblyopia Neg Hx   . Blindness Neg Hx   . Cataracts Neg Hx   . Glaucoma Neg Hx   . Macular degeneration Neg Hx   . Retinal detachment Neg Hx   . Strabismus Neg Hx   . Retinitis pigmentosa Neg Hx     SOCIAL HISTORY Social History   Tobacco Use  . Smoking status: Former Smoker    Last attempt to quit: 02/23/1972    Years since quitting: 46.8  . Smokeless tobacco: Never Used  Substance Use Topics  . Alcohol use: Yes    Alcohol/week: 1.0 standard drinks    Types: 1 Standard drinks or equivalent per week    Comment:  occasional beer  . Drug use: No         OPHTHALMIC EXAM:  Base Eye Exam    Visual Acuity (Snellen - Linear)      Right Left   Dist cc 20/30 -2 20/20 -2   Dist ph cc 20/25 NI   Correction:  Glasses       Tonometry (Tonopen, 8:18 AM)      Right Left   Pressure 13 13       Pupils      Dark Light Shape React APD   Right  3 2 Round Slow None   Left 3 3 Round Minimal None       Visual Fields (Counting fingers)      Left Right    Full Full       Extraocular Movement      Right Left    Full, Ortho Full, Ortho       Neuro/Psych    Oriented x3:  Yes   Mood/Affect:  Normal       Dilation    Both eyes:  1.0% Mydriacyl, 2.5% Phenylephrine @ 8:18 AM        Slit Lamp and Fundus Exam    Slit Lamp Exam      Right Left   Lids/Lashes Dermatochalasis - upper lid Dermatochalasis - upper lid   Conjunctiva/Sclera White and quiet White and quiet   Cornea 1+ Punctate epithelial erosions, mild Arcus 1+ Punctate epithelial erosions, mild Arcus   Anterior Chamber deep and clear deep and clear   Iris Round and dilated Round and dilated   Lens 2+ Nuclear sclerosis, 3+ Cortical cataract 2+ Nuclear sclerosis, 3+ Cortical cataract   Vitreous Vitreous syneresis Vitreous syneresis       Fundus Exam      Right Left   Disc Pink and Sharp Pink and Sharp   C/D Ratio 0.65 0.65   Macula Good foveal reflex, Retinal pigment epithelial mottling, No heme or edema Blunted foveal reflex, Retinal pigment epithelial mottling, horizontal wedge of retinal whitening just superior to fovea   Vessels Vascular attenuation Vascular attenuation, Tortuous, arteriole within retinal whitening perfused   Periphery Attached, reticular degeneration, small operculated hole at 0845 with surrounding pigment, no SRF Attached; reticular degeneration        Refraction    Wearing Rx      Sphere Cylinder Axis Add   Right +3.75 +1.25 180 +2.50   Left +3.25 +1.00 180 +2.50       Manifest Refraction      Sphere  Cylinder Axis Dist VA   Right +4.00 +1.00 180 20/25   Left +3.50 +1.25 180 20/20-2          IMAGING AND PROCEDURES  Imaging and Procedures for @TODAY @  OCT, Retina - OU - Both Eyes       Right Eye Quality was good. Central Foveal Thickness: 266. Progression has no prior data. Findings include normal foveal contour, no SRF, no IRF.   Left Eye Quality was good. Central Foveal Thickness: 266. Progression has no prior data. Findings include normal foveal contour, no SRF, no IRF, intraretinal hyper-reflective material (IRHM superior macula, partial PVD).   Notes *Images captured and stored on drive  Diagnosis / Impression:  NFP; no IRF/SRF OU OS: inner retinal hyper-reflectivity -- superior macula -- BRAO  Clinical management:  See below  Abbreviations: NFP - Normal foveal profile. CME - cystoid macular edema. PED - pigment epithelial detachment. IRF - intraretinal fluid. SRF - subretinal fluid. EZ - ellipsoid zone. ERM - epiretinal membrane. ORA - outer retinal atrophy. ORT - outer retinal tubulation. SRHM - subretinal hyper-reflective material        Fluorescein Angiography Optos (Transit OS)       Right Eye   Progression has no prior data. Early phase findings include staining, normal observations. Mid/Late phase findings include staining, normal observations.   Left Eye   Progression has no prior data. Early phase findings include delayed filling, vascular perfusion defect. Mid/Late phase findings include vascular perfusion defect, microaneurysm, staining.   Notes  Images stored on drive;   Impression: OD: normal study OS: delayed filling and vascular perfusion defects consistent with BRAO within superior macula               Carotid U/S 4.1.2019  IMPRESSION: 50-69% stenosis in the right internal carotid artery.  Left internal carotid artery occlusion.     ASSESSMENT/PLAN:    ICD-10-CM   1. 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 Retinal edema H35.81 OCT, Retina - OU - Both Eyes  3. Operculated retinal tear of right eye H33.311   4. Essential hypertension I10   5. 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 BRAO OS - focal retinal whitening on exam and hyperreflectivity on OCT superior macula consistent with branch retinal artery occlusion - pt has corresponding focal blurriness in inferior visual field, while fovea remains unaffected at this time and BCVA remains 20/20 OS - no embolus seen on exam - Fluorescein angiogram shows delayed filling time and vascular perfusion defect in superior macula and peripheral retina distal to BRAO - pt has a known history of left internal carotid artery occlusion by carotid ultrasound done on 02/14/2018 -- no referral made to vascular surgery at the time - discussed case with Dr. Scot Dock, vascular surgeon on call today who recommended outpt f/u with him in the next week for carotid eval - recommend evaluation and optimization of cardiovascular risk factors by primary care provider -- [name] -- will send letter to her  - Recommend EKG, Echocardiogram, CBC w/ diff, Hemoglobin A1c, PT/PTT, ESR, CRP - start Brimonidine BID OS - f/u here in 2-3 weeks  3. Operculated retinal hole OD - small, round hole with surrounding pigment and overlying opercula at 0300 - asymptomatic, no SRF, low risk - will monitor for now -- possible laser once OS stable from above  4,5. Hypertensive retinopathy OU  - discussed importance of tight BP control  - monitor  6. 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  - discussed diagnosis and progression  - not yet visually significant  - monitor for now   Ophthalmic Meds Ordered this visit:  Meds ordered this encounter  Medications  . brimonidine (ALPHAGAN) 0.2 % ophthalmic solution    Sig:  Place 1 drop into the left eye 2 (two) times daily.    Dispense:  10 mL    Refill:  1       Return for f/u 2-3 weeks BRAO OS, DFE, OCT.  There are no Patient Instructions on file for this visit.   Explained the diagnoses, plan, and follow up with the patient and they expressed understanding.  Patient expressed understanding of the importance of proper follow up care.   This document serves as a record of services personally performed by Gardiner Sleeper, MD, PhD. It was created on their behalf by Ernest Mallick, OA, an ophthalmic assistant. The creation of this record is the provider's dictation and/or activities during the visit.    Electronically signed by: Ernest Mallick, OA  02.17.2020 12:41 PM    Gardiner Sleeper, M.D., Ph.D. Diseases & Surgery of the Retina and Vitreous [facility]  I have reviewed the above documentation for accuracy and completeness, and I agree with the above. Gardiner Sleeper, M.D., Ph.D. 01/03/19 12:41 PM    Abbreviations: M myopia (nearsighted); A  astigmatism; H hyperopia (farsighted); P presbyopia; Mrx spectacle prescription;  CTL contact lenses; OD right eye; OS left eye; OU both eyes  XT exotropia; ET esotropia; PEK punctate epithelial keratitis; PEE punctate epithelial erosions; DES dry eye syndrome; MGD meibomian gland dysfunction; ATs artificial tears; PFAT's preservative free artificial tears; [place] nuclear sclerotic cataract; PSC posterior subcapsular cataract; ERM epi-retinal membrane; PVD posterior vitreous detachment; RD retinal detachment; DM diabetes mellitus; DR diabetic retinopathy; NPDR non-proliferative diabetic retinopathy; PDR proliferative diabetic retinopathy; CSME clinically significant macular edema; DME diabetic macular edema; dbh dot blot hemorrhages; CWS cotton wool spot; POAG primary open angle glaucoma; C/D cup-to-disc ratio; HVF humphrey visual field; GVF goldmann visual field; OCT optical coherence tomography; IOP  intraocular pressure; BRVO Branch retinal vein occlusion; CRVO central retinal vein occlusion; CRAO central retinal artery occlusion; BRAO branch retinal artery occlusion; RT retinal tear; SB scleral buckle; PPV pars plana vitrectomy; VH Vitreous hemorrhage; PRP panretinal laser photocoagulation; IVK intravitreal kenalog; VMT vitreomacular traction; MH Macular hole;  NVD neovascularization of the disc; NVE neovascularization elsewhere; AREDS age related eye disease study; ARMD age related macular degeneration; POAG primary open angle glaucoma; EBMD epithelial/anterior basement membrane dystrophy; ACIOL anterior chamber intraocular lens; IOL intraocular lens; PCIOL posterior chamber intraocular lens; Phaco/IOL phacoemulsification with intraocular lens placement; [place] photorefractive keratectomy; LASIK laser assisted in situ keratomileusis; HTN hypertension; DM diabetes mellitus; COPD chronic obstructive pulmonary disease

## 2019-01-03 ENCOUNTER — Ambulatory Visit (INDEPENDENT_AMBULATORY_CARE_PROVIDER_SITE_OTHER): Payer: Medicare Other | Admitting: Ophthalmology

## 2019-01-03 ENCOUNTER — Other Ambulatory Visit: Payer: Self-pay

## 2019-01-03 ENCOUNTER — Encounter (INDEPENDENT_AMBULATORY_CARE_PROVIDER_SITE_OTHER): Payer: Self-pay | Admitting: Ophthalmology

## 2019-01-03 DIAGNOSIS — H34232 Retinal artery branch occlusion, left eye: Secondary | ICD-10-CM

## 2019-01-03 DIAGNOSIS — H35033 Hypertensive retinopathy, bilateral: Secondary | ICD-10-CM | POA: Diagnosis not present

## 2019-01-03 DIAGNOSIS — H33311 Horseshoe tear of retina without detachment, right eye: Secondary | ICD-10-CM | POA: Diagnosis not present

## 2019-01-03 DIAGNOSIS — H25813 Combined forms of age-related cataract, bilateral: Secondary | ICD-10-CM

## 2019-01-03 DIAGNOSIS — H3581 Retinal edema: Secondary | ICD-10-CM

## 2019-01-03 DIAGNOSIS — I6522 Occlusion and stenosis of left carotid artery: Secondary | ICD-10-CM

## 2019-01-03 DIAGNOSIS — I10 Essential (primary) hypertension: Secondary | ICD-10-CM

## 2019-01-03 MED ORDER — BRIMONIDINE TARTRATE 0.2 % OP SOLN: 1.0000 [drp] | Freq: Two times a day (BID) | OPHTHALMIC | 1 refills | Status: DC

## 2019-01-03 NOTE — Progress Notes (Deleted)
Carotid U/S 4.1.2019  IMPRESSION: 50-69% stenosis in the right internal carotid artery.  Left internal carotid artery occlusion.

## 2019-01-04 ENCOUNTER — Encounter: Payer: Self-pay | Admitting: Vascular Surgery

## 2019-01-04 ENCOUNTER — Ambulatory Visit (INDEPENDENT_AMBULATORY_CARE_PROVIDER_SITE_OTHER): Payer: Medicare Other | Admitting: Vascular Surgery

## 2019-01-04 ENCOUNTER — Ambulatory Visit (HOSPITAL_COMMUNITY)
Admission: RE | Admit: 2019-01-04 | Discharge: 2019-01-04 | Disposition: A | Discharge status: Home / Self Care | Payer: Medicare Other | Source: Ambulatory Visit | Attending: Vascular Surgery | Admitting: Vascular Surgery

## 2019-01-04 VITALS — BP 127/77 | HR 74 | Temp 97.6°F | Resp 16 | Ht 69.0 in | Wt 202.2 lb

## 2019-01-04 DIAGNOSIS — I6523 Occlusion and stenosis of bilateral carotid arteries: Secondary | ICD-10-CM

## 2019-01-04 DIAGNOSIS — I6522 Occlusion and stenosis of left carotid artery: Secondary | ICD-10-CM | POA: Insufficient documentation

## 2019-01-04 NOTE — Progress Notes (Signed)
REASON FOR CONSULT:    Left retinal artery occlusion.  The consult is requested by Dr. Coralyn Pear.  HPI:   Francisco [name] is a pleasant 78 y.o. male, who was seen by [facility] yesterday by Dr. Coralyn Pear.  The patient was found to have a branch retinal artery occlusion of the left eye.  He has a history of hypertensive retinopathy of both eyes and a history of a retinal tear in the right eye.  The patient has a history of an occluded left internal carotid artery and therefore we were asked to see him in consultation in the office this week.  The patient is right-handed.  He denies any history of stroke, TIAs, expressive or receptive aphasia.  The patient a week ago developed a visual disturbance in the left eye where he states that it look like a balloon Christmas tree was laying horizontal in the bottom of his visual field.  This was persistent and he was seen by his retinal specialist who felt that he had a branch vessel occlusion on the left.  On careful review of his records it appeared that he had a carotid duplex scan in April 2019 which showed some bilateral carotid disease.  He was referred to our office for further evaluation.  He states that he was given eyedrops for the visual disturbance and that his symptoms have improved.  He is on aspirin and is on a statin.  He does state that occasionally his left arm gets numb if he sleeps on it the wrong way.  Of note he has a history of significant cervical disc disease and has had 2 operations on his neck.  Past Medical History:  Diagnosis Date  . Anxiety   . GERD (gastroesophageal reflux disease)   . Hypertension     Family History  Problem Relation Age of Onset  . Healthy Mother   . Transient ischemic attack Father   . Rheum arthritis Daughter   . Amblyopia Neg Hx   . Blindness Neg Hx   . Cataracts Neg Hx   . Glaucoma Neg Hx   . Macular degeneration Neg Hx   . Retinal detachment Neg Hx   . Strabismus Neg  Hx   . Retinitis pigmentosa Neg Hx     SOCIAL HISTORY: Social History   Socioeconomic History  . Marital status: Widowed    Spouse name: Not on file  . Number of children: 2  . Years of education: Not on file  . Highest education level: Not on file  Occupational History  . Occupation: Retired    Fish farm manager: [employer]  Social Needs  . Financial resource strain: Not on file  . Food insecurity:    Worry: Not on file    Inability: Not on file  . Transportation needs:    Medical: Not on file    Non-medical: Not on file  Tobacco Use  . Smoking status: Former Smoker    Last attempt to quit: 02/23/1972    Years since quitting: 46.8  . Smokeless tobacco: Never Used  Substance and Sexual Activity  . Alcohol use: Yes    Alcohol/week: 1.0 standard drinks    Types: 1 Standard drinks or equivalent per week    Comment: occasional beer  . Drug use: No  . Sexual activity: Not Currently  Lifestyle  . Physical activity:    Days per week: Not on file    Minutes per session: Not on file  .  Stress: Not on file  Relationships  . Social connections:    Talks on phone: Not on file    Gets together: Not on file    Attends religious service: Not on file    Active member of club or organization: Not on file    Attends meetings of clubs or organizations: Not on file    Relationship status: Not on file  . Intimate partner violence:    Fear of current or ex partner: Not on file    Emotionally abused: Not on file    Physically abused: Not on file    Forced sexual activity: Not on file  Other Topics Concern  . Not on file  Social History Narrative  . Not on file    Allergies  Allergen Reactions  . Crestor [Rosuvastatin]   . Lipitor [Atorvastatin]   . Penicillins     REACTION: swelling/hives Has patient had a PCN reaction causing immediate rash, facial/tongue/throat swelling, SOB or lightheadedness with hypotension:yes Has patient had a PCN reaction causing severe rash involving mucus  membranes or skin necrosis: Yes Has patient had a PCN reaction that required hospitalization No Has patient had a PCN reaction occurring within the last 10 years: No If all of the above answers are "NO", then may proceed with Cephalosporin use.   . Symbicort [Budesonide-Formoterol Fumarate]     Pain  Behind ribs    Current Outpatient Medications  Medication Sig Dispense Refill  . ALPRAZolam (XANAX) 1 MG tablet Take 1 tablet (1 mg total) by mouth 2 (two) times daily as needed. 60 tablet 2  . aspirin 81 MG tablet Take 81 mg by mouth daily.    . brimonidine (ALPHAGAN) 0.2 % ophthalmic solution Place 1 drop into the left eye 2 (two) times daily. 10 mL 1  . escitalopram (LEXAPRO) 10 MG tablet Take 2 tablets (20 mg total) by mouth daily. 180 tablet 1  . fish oil-omega-3 fatty acids 1000 MG capsule Take 2 g by mouth daily.    . furosemide (LASIX) 20 MG tablet Take 1 tablet (20 mg total) by mouth daily. 90 tablet 1  . meclizine (ANTIVERT) 25 MG tablet TAKE 1 TABLET BY MOUTH THREE TIMES DAILY AS NEEDED FOR  DIZZINESS 30 tablet 2  . meloxicam (MOBIC) 7.5 MG tablet TAKE 1 TABLET BY MOUTH ONCE DAILY 30 tablet 0  . simvastatin (ZOCOR) 40 MG tablet Take 1 tablet (40 mg total) by mouth at bedtime. 90 tablet 1  . tamsulosin (FLOMAX) 0.4 MG CAPS capsule Take 1 capsule (0.4 mg total) by mouth daily. 90 capsule 1   No current facility-administered medications for this visit.     REVIEW OF SYSTEMS:  [X]  denotes positive finding, [ ]  denotes negative finding Cardiac  Comments:  Chest pain or chest pressure:    Shortness of breath upon exertion:    Short of breath when lying flat:    Irregular heart rhythm:        Vascular    Pain in calf, thigh, or hip brought on by ambulation:    Pain in feet at night that wakes you up from your sleep:     Blood clot in your veins:    Leg swelling:         Pulmonary    Oxygen at home:    Productive cough:     Wheezing:         Neurologic    Sudden weakness  in arms or legs:  Sudden numbness in arms or legs:  x   Sudden onset of difficulty speaking or slurred speech:    Temporary loss of vision in one eye:     Problems with dizziness:  x       Gastrointestinal    Blood in stool:     Vomited blood:         Genitourinary    Burning when urinating:     Blood in urine:        Psychiatric    Major depression:         Hematologic    Bleeding problems:    Problems with blood clotting too easily:        Skin    Rashes or ulcers:        Constitutional    Fever or chills:     PHYSICAL EXAM:   Vitals:   01/04/19 1544 01/04/19 1548  BP: 118/75 127/77  Pulse: 74   Resp: 16   Temp: 97.6 F (36.4 C)   TempSrc: Oral   SpO2: 96%   Weight: 202 lb 2.6 oz (91.7 kg)   Height: 5\' 9"  (1.753 m)     GENERAL: The patient is a well-nourished male, in no acute distress. The vital signs are documented above. CARDIAC: There is a regular rate and rhythm.  VASCULAR: I do not detect carotid bruits. He has palpable femoral and pedal pulses bilaterally. PULMONARY: There is good air exchange bilaterally without wheezing or rales. ABDOMEN: Soft and non-tender with normal pitched bowel sounds.  MUSCULOSKELETAL: There are no major deformities or cyanosis. NEUROLOGIC: No focal weakness or paresthesias are detected. SKIN: There are no ulcers or rashes noted. PSYCHIATRIC: The patient has a normal affect.  DATA:    CAROTID DUPLEX: I have independently interpreted his carotid duplex scan.  On the left side the internal carotid artery is occluded.  The external carotid artery has some proximal stenosis.  On the right side there is a 40 to 59% carotid stenosis.  This is in the lower end of that range based on the velocities.  Both vertebral arteries are patent with antegrade flow.  CAROTID DUPLEX: I did review the carotid duplex scan that was done at [facility] in 2019 in April.  This showed that the left internal carotid artery was occluded  at that time also.  Therefore this is not a new finding.  He had a 50 to 69% right internal carotid artery stenosis at that time.   ASSESSMENT & PLAN:   BILATERAL CAROTID DISEASE: This patient has an occluded left internal carotid artery with a 40 to 59% right carotid stenosis.  This is in the lower end of that range.  I explained that there is nothing we can do surgically with an occluded internal carotid artery and I do not think this explains his retinal artery occlusion.  There is a very small chance that he can had extensive collaterals from his external carotid artery but there is only a mild stenosis of the proximal external carotid artery on the left.  I recommended a follow-up carotid duplex scan in 1 year and I will see him back at that time.  He is on aspirin and is on a statin.  He is not a smoker.  He knows to call sooner if he has problems.   Deitra Mayo [facility] [phone]

## 2019-01-20 ENCOUNTER — Ambulatory Visit (INDEPENDENT_AMBULATORY_CARE_PROVIDER_SITE_OTHER): Payer: Medicare Other | Admitting: Nurse Practitioner

## 2019-01-20 ENCOUNTER — Encounter: Payer: Self-pay | Admitting: Nurse Practitioner

## 2019-01-20 VITALS — BP 132/74 | HR 65 | Temp 97.2°F | Ht 69.0 in | Wt 202.0 lb

## 2019-01-20 DIAGNOSIS — Z9889 Other specified postprocedural states: Secondary | ICD-10-CM

## 2019-01-20 DIAGNOSIS — F411 Generalized anxiety disorder: Secondary | ICD-10-CM

## 2019-01-20 DIAGNOSIS — I10 Essential (primary) hypertension: Secondary | ICD-10-CM

## 2019-01-20 DIAGNOSIS — E785 Hyperlipidemia, unspecified: Secondary | ICD-10-CM | POA: Diagnosis not present

## 2019-01-20 DIAGNOSIS — Z6831 Body mass index (BMI) 31.0-31.9, adult: Secondary | ICD-10-CM

## 2019-01-20 DIAGNOSIS — N400 Enlarged prostate without lower urinary tract symptoms: Secondary | ICD-10-CM

## 2019-01-20 DIAGNOSIS — R3911 Hesitancy of micturition: Secondary | ICD-10-CM

## 2019-01-20 DIAGNOSIS — K219 Gastro-esophageal reflux disease without esophagitis: Secondary | ICD-10-CM | POA: Diagnosis not present

## 2019-01-20 MED ORDER — SIMVASTATIN 40 MG PO TABS: 40.0000 mg | ORAL_TABLET | Freq: Every day | ORAL | 1 refills | Status: DC

## 2019-01-20 MED ORDER — ESCITALOPRAM OXALATE 10 MG PO TABS: 20.0000 mg | ORAL_TABLET | Freq: Every day | ORAL | 1 refills | Status: DC

## 2019-01-20 MED ORDER — ALPRAZOLAM 1 MG PO TABS: 1.0000 mg | ORAL_TABLET | Freq: Two times a day (BID) | ORAL | 2 refills | Status: DC | PRN

## 2019-01-20 MED ORDER — TAMSULOSIN HCL 0.4 MG PO CAPS: 0.4000 mg | ORAL_CAPSULE | Freq: Every day | ORAL | 1 refills | Status: DC

## 2019-01-20 MED ORDER — FUROSEMIDE 20 MG PO TABS: 20.0000 mg | ORAL_TABLET | Freq: Every day | ORAL | 1 refills | Status: DC

## 2019-01-20 NOTE — Addendum Note (Signed)
Addended by: [name] on: 01/20/2019 10:20 AM   Modules accepted: Orders

## 2019-01-20 NOTE — Patient Instructions (Signed)
Dysphagia Eating Plan, Bite Size Food °This diet plan is for people with moderate swallowing problems who have transitioned from pureed and minced foods. Bite size foods are soft and cut into small chunks so that they can be swallowed safely. On this eating plan, you may be instructed to drink liquids that are thickened. °Work with your health care provider and your diet and nutrition specialist (dietitian) to make sure that you are following the diet safely and getting all the nutrients you need. °What are tips for following this plan? °General guidelines for foods ° °· You may eat foods that are tender, soft, and moist. °· Always test food texture before taking a bite. Poke food with a fork or spoon to make sure it is tender. °· Food should be easy to cut and shew. Avoid large pieces of food that require a lot of chewing. °· Take small bites. Each bite should be smaller than your thumb nail (about 15mm by 15 mm). °· If you were on pureed and minced food diet plans, you may eat any of the foods included in those diets. °· Avoid foods that are very dry, hard, sticky, chewy, coarse, or crunchy. °· If instructed by your health care provider, thicken liquids. Follow your health care provider's instructions for what products to use, how to do this, and to what thickness. °? Your health care provider may recommend using a commercial thickener, rice cereal, or potato flakes. Ask your health care provider to recommend thickeners. °? Thickened liquids are usually a “pudding-like” consistency, or they may be as thick as honey or thick enough to eat with a spoon. °Cooking °· To moisten foods, you may add liquids while you are blending, mashing, or grinding your foods to the right consistency. These liquids include gravies, sauces, vegetable or fruit juice, milk, half and half, or water. °· Strain extra liquid from foods before eating. °· Reheat foods slowly to prevent a tough crust from forming. °· Prepare foods in  advance. °Meal planning °· Eat a variety of foods to get all the nutrients you need. °· Some foods may be tolerated better than others. Work with your dietitian to identify which foods are safest for you to eat. °· Follow your meal plan as told by your dietitian. °What foods are allowed? °Grains °Moist breads without nuts or seeds. Biscuits, muffins, pancakes, and waffles that are well-moistened with syrup, jelly, margarine, or butter. Cooked cereals. Moist bread stuffing. Moist rice. Well-moistened cold cereal with small chunks. Well-cooked pasta, noodles, rice, and bread dressing in small pieces and thick sauce. Soft dumplings or spaetzle in small pieces and butter or gravy. °Vegetables °Soft, well-cooked vegetables in small pieces. Soft-cooked, mashed potatoes. Thickened vegetable juice. °Fruits °Canned or cooked fruits that are soft or moist and do not have skin or seeds. Fresh, soft bananas. Thickened fruit juices. °Meat and other protein foods °Tender, moist meats or poultry in small pieces. Moist meatballs or meatloaf. Fish without bones. Eggs or egg substitutes in small pieces. Tofu. Tempeh and meat alternatives in small pieces. Well-cooked, tender beans, peas, baked beans, and other legumes. °Dairy °Thickened milk. Cream cheese. Yogurt. Cottage cheese. Sour cream. Small pieces of soft cheese. °Fats and oils °Butter. Oils. Margarine. Mayonnaise. Gravy. Spreads. °Sweets and desserts °Soft, smooth, moist desserts. Pudding. Custard. Moist cakes. Jam. Jelly. Honey. Preserves. Ask your health care provider whether you can have frozen desserts. °Seasoning and other foods °All seasonings and sweeteners. All sauces with small chunks. Prepared tuna, egg, or chicken   salad without raw fruits or vegetables. Moist casseroles with small, tender pieces of meat. Soups with tender meat. °What foods are not allowed? °Grains °Coarse or dry cereals. Dry breads. Toast. Crackers. Tough, crusty breads, such as French bread and  baguettes. Dry pancakes, waffles, and muffins. Sticky rice. Dry bread stuffing. Granola. Popcorn. Chips. °Vegetables °All raw vegetables. Cooked corn. Rubbery or stiff cooked vegetables. Stringy vegetables, such as celery. Tough, crisp fried potatoes. Potato skins. °Fruits °Hard, crunchy, stringy, high-pulp, and juicy raw fruits such as apples, pineapple, papaya, and watermelon. Small, round fruits, such as grapes. Dried fruit and fruit leather. °Meat and other protein foods °Large pieces of meat. Dry, tough meats, such as bacon, sausage, and hot dogs. Chicken, turkey, or fish with skin and bones. Crunchy peanut butter. Nuts. Seeds. Nut and seed butters. °Dairy °Yogurt with nuts, seeds, or large chunks. Large chunks of cheese. Frozen desserts and milk consistency not allowed by your dietitian. °Sweets and desserts °Dry cakes. Chewy or dry cookies. Any desserts with nuts, seeds, dry fruits, coconut, pineapple, or anything dry, sticky, or hard. Chewy caramel. Licorice. Taffy-type candies. Ask your health care provider whether you can have frozen desserts. °Seasoning and other foods °Soups with tough or large chunks of meats, poultry, or vegetables. Corn or clam chowder. Smoothies with large chunks of fruit. °Summary °· Bite size foods can be helpful for people with moderate swallowing problems. °· On the dysphagia eating plan, you may eat foods that are soft, moist, and cut into pieces smaller than 15mm by 15mm. °· You may be instructed to thicken liquids. Follow your health care provider's instructions about how to do this and to what consistency. °This information is not intended to replace advice given to you by your health care provider. Make sure you discuss any questions you have with your health care provider. °Document Released: 11/02/2005 Document Revised: 02/12/2017 Document Reviewed: 02/12/2017 °Elsevier Interactive Patient Education © 2019 [employer] ° °

## 2019-01-20 NOTE — Progress Notes (Signed)
Subjective:    Patient ID: Francisco [name], male    DOB: 1941-08-14, 78 y.o.   MRN: [id number]   Chief Complaint: Medical Management of Chronic Issues   HPI:  1. Essential hypertension  No c/o chest pain, sob or headache. Does not check blood pressure at home. BP Readings from Last 3 Encounters:  01/20/19 132/74  01/04/19 127/77  10/10/18 (!) 146/90     2. Hyperlipidemia, unspecified hyperlipidemia type  Tries to watch diet but does no exercise  3. Gastroesophageal reflux disease without esophagitis  Takes otc meds when needed  4. Benign prostatic hyperplasia, unspecified whether lower urinary tract symptoms present  Is on flomax and is havig n o problems voiding  5. GAD (generalized anxiety disorder)  Is on xanax and lexapro dialy. Is doing okay. Depression screen [facility] 2/9 01/20/2019 09/22/2018 09/05/2018  Decreased Interest 0 1 1  Down, Depressed, Hopeless 0 0 0  PHQ - 2 Score 0 1 1  Altered sleeping - - -  Tired, decreased energy - - -  Change in appetite - - -  Feeling bad or failure about yourself  - - -  Trouble concentrating - - -  Moving slowly or fidgety/restless - - -  Suicidal thoughts - - -  PHQ-9 Score - - -  Some recent data might be hidden     6. BMI 31.0-31.9,adult  No recent weight changes    Outpatient Encounter Medications as of 01/20/2019  Medication Sig  . ALPRAZolam (XANAX) 1 MG tablet Take 1 tablet (1 mg total) by mouth 2 (two) times daily as needed.  Marland Kitchen aspirin 81 MG tablet Take 81 mg by mouth daily.  . brimonidine (ALPHAGAN) 0.2 % ophthalmic solution Place 1 drop into the left eye 2 (two) times daily.  Marland Kitchen escitalopram (LEXAPRO) 10 MG tablet Take 2 tablets (20 mg total) by mouth daily.  . fish oil-omega-3 fatty acids 1000 MG capsule Take 2 g by mouth daily.  . furosemide (LASIX) 20 MG tablet Take 1 tablet (20 mg total) by mouth daily.  . meclizine (ANTIVERT) 25 MG tablet TAKE 1 TABLET BY MOUTH THREE TIMES DAILY AS NEEDED FOR  DIZZINESS  .  meloxicam (MOBIC) 7.5 MG tablet TAKE 1 TABLET BY MOUTH ONCE DAILY  . simvastatin (ZOCOR) 40 MG tablet Take 1 tablet (40 mg total) by mouth at bedtime.  . tamsulosin (FLOMAX) 0.4 MG CAPS capsule Take 1 capsule (0.4 mg total) by mouth daily.      New complaints: -has occasional problems swallowing- happens about 1 x a month- not sure if not chewing food well or not  Social history: Lives alone- is very anxious since wife died.  Review of Systems  Constitutional: Negative.  Negative for activity change and appetite change.  HENT: Negative.   Eyes: Negative for pain.  Respiratory: Negative.  Negative for shortness of breath.   Cardiovascular: Negative.  Negative for chest pain, palpitations and leg swelling.  Gastrointestinal: Negative for abdominal pain.  Endocrine: Negative for polydipsia.  Genitourinary: Negative.   Skin: Negative for rash.  Neurological: Negative for dizziness, weakness and headaches.  Hematological: Does not bruise/bleed easily.  Psychiatric/Behavioral: Negative.   All other systems reviewed and are negative.      Objective:   Physical Exam Vitals signs and nursing note reviewed.  Constitutional:      Appearance: Normal appearance. He is well-developed.  HENT:     Head: Normocephalic.     Nose: Nose normal.  Eyes:  Pupils: Pupils are equal, round, and reactive to light.  Neck:     Musculoskeletal: Normal range of motion and neck supple.     Thyroid: No thyroid mass or thyromegaly.     Vascular: No carotid bruit or JVD.     Trachea: Phonation normal.  Cardiovascular:     Rate and Rhythm: Normal rate and regular rhythm.  Pulmonary:     Effort: Pulmonary effort is normal. No respiratory distress.     Breath sounds: Normal breath sounds.  Abdominal:     General: Bowel sounds are normal.     Palpations: Abdomen is soft.     Tenderness: There is no abdominal tenderness.  Musculoskeletal: Normal range of motion.  Lymphadenopathy:     Cervical: No  cervical adenopathy.  Skin:    General: Skin is warm and dry.     Comments: Purplish lip lesion right lower lip- seems to be increasing in size.  Neurological:     Mental Status: He is alert and oriented to person, place, and time.  Psychiatric:        Behavior: Behavior normal.        Thought Content: Thought content normal.        Judgment: Judgment normal.    BP 132/74   Pulse 65   Temp (!) 97.2 F (36.2 C) (Oral)   Ht 5\' 9"  (1.753 m)   Wt 202 lb (91.6 kg)   BMI 29.83 kg/m       Assessment & Plan:  DERWARD MARPLE comes in today with chief complaint of Medical Management of Chronic Issues   Diagnosis and orders addressed:  1. Essential hypertension Low sodium diet - furosemide (LASIX) 20 MG tablet; Take 1 tablet (20 mg total) by mouth daily.  Dispense: 90 tablet; Refill: 1  2. Hyperlipidemia, unspecified hyperlipidemia type Low fat diet - simvastatin (ZOCOR) 40 MG tablet; Take 1 tablet (40 mg total) by mouth at bedtime.  Dispense: 90 tablet; Refill: 1  3. Gastroesophageal reflux disease without esophagitis Avoid spicy foods Do not eat 2 hours prior to bedtime  4. Benign prostatic hyperplasia, unspecified whether lower urinary tract symptoms present - tamsulosin (FLOMAX) 0.4 MG CAPS capsule; Take 1 capsule (0.4 mg total) by mouth daily.  Dispense: 90 capsule; Refill: 1  5. GAD (generalized anxiety disorder) Stress management - escitalopram (LEXAPRO) 10 MG tablet; Take 2 tablets (20 mg total) by mouth daily.  Dispense: 180 tablet; Refill: 1 - ALPRAZolam (XANAX) 1 MG tablet; Take 1 tablet (1 mg total) by mouth 2 (two) times daily as needed.  Dispense: 60 tablet; Refill: 2  6. BMI 31.0-31.9,adult Discussed diet and exercise for person with BMI >25 Will recheck weight in 3-6 months  7. Urinary hesitancy - tamsulosin (FLOMAX) 0.4 MG CAPS capsule; Take 1 capsule (0.4 mg total) by mouth daily.  Dispense: 90 capsule; Refill: 1  8. H/O colonoscopy - Ambulatory  referral to Gastroenterology   Labs pending Health Maintenance reviewed Diet and exercise encouraged  Follow up plan: 3 months   [facility], FNP

## 2019-01-21 LAB — CMP14+EGFR
ALBUMIN: 4.6 g/dL (ref 3.7–4.7)
ALT: 12 IU/L (ref 0–44)
AST: 20 IU/L (ref 0–40)
Albumin/Globulin Ratio: 3.1 — ABNORMAL HIGH (ref 1.2–2.2)
Alkaline Phosphatase: 46 IU/L (ref 39–117)
BUN / CREAT RATIO: 17 (ref 10–24)
BUN: 21 mg/dL (ref 8–27)
Bilirubin Total: 0.6 mg/dL (ref 0.0–1.2)
CO2: 23 mmol/L (ref 20–29)
Calcium: 9.1 mg/dL (ref 8.6–10.2)
Chloride: 103 mmol/L (ref 96–106)
Creatinine, Ser: 1.25 mg/dL (ref 0.76–1.27)
GFR calc Af Amer: 64 mL/min/{1.73_m2} (ref >59)
GFR calc non Af Amer: 55 mL/min/{1.73_m2} — ABNORMAL LOW (ref >59)
GLUCOSE: 146 mg/dL — AB (ref 65–99)
Globulin, Total: 1.5 g/dL (ref 1.5–4.5)
Potassium: 4.3 mmol/L (ref 3.5–5.2)
Sodium: 143 mmol/L (ref 134–144)
TOTAL PROTEIN: 6.1 g/dL (ref 6.0–8.5)

## 2019-01-21 LAB — LIPID PANEL
Chol/HDL Ratio: 3.3 ratio (ref 0.0–5.0)
Cholesterol, Total: 144 mg/dL (ref 100–199)
HDL: 43 mg/dL (ref >39)
LDL Calculated: 76 mg/dL (ref 0–99)
Triglycerides: 127 mg/dL (ref 0–149)
VLDL Cholesterol Cal: 25 mg/dL (ref 5–40)

## 2019-01-22 NOTE — Progress Notes (Signed)
[facility] Note  01/24/2019     CHIEF COMPLAINT Patient presents for Retina Follow Up   HISTORY OF PRESENT ILLNESS: Francisco [name] is a 78 y.o. male who presents to the clinic today for:   HPI    Retina Follow Up    Patient presents with  Other.  In left eye.  I, the attending physician,  performed the HPI with the patient and updated documentation appropriately.          Comments    3 week follow up BRAO OS.  Patient states with both eyes open, his vision is great but has noticed his right eye is not as clear as the left.  Patient denies eye pain or discomfort and denies any new or worsening floaters or fol OU.       Last edited by Bernarda Caffey, MD on 01/24/2019  9:33 AM. (History)    pt states he saw Dr. 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 Maryjane Hurter for concern of CRAO LEE: (M.Le) [BCVA: OD: OS:] Ocular Hx- PMH-anxiety, HTN    CURRENT MEDICATIONS: Current Outpatient Medications (Ophthalmic Drugs)  Medication Sig  . brimonidine (ALPHAGAN) 0.2 % ophthalmic solution Place 1 drop into the left eye 2 (two) times daily.   No current facility-administered medications for this visit.  (Ophthalmic Drugs)   Current Outpatient Medications (Other)  Medication Sig  . ALPRAZolam (XANAX) 1 MG tablet Take 1 tablet (1 mg total) by mouth 2 (two) times daily as needed.  Marland Kitchen aspirin 81 MG  tablet Take 81 mg by mouth daily.  Marland Kitchen escitalopram (LEXAPRO) 10 MG tablet Take 2 tablets (20 mg total) by mouth daily.  . fish oil-omega-3 fatty acids 1000 MG capsule Take 2 g by mouth daily.  . furosemide (LASIX) 20 MG tablet Take 1 tablet (20 mg total) by mouth daily.  . meclizine (ANTIVERT) 25 MG tablet TAKE 1 TABLET BY MOUTH THREE TIMES DAILY AS NEEDED FOR  DIZZINESS  . meloxicam (MOBIC) 7.5 MG tablet TAKE 1 TABLET BY MOUTH ONCE DAILY  . simvastatin (ZOCOR) 40 MG tablet Take 1 tablet (40 mg total) by mouth at bedtime.  . tamsulosin (FLOMAX) 0.4 MG CAPS capsule Take 1 capsule (0.4 mg total) by mouth daily.   No current facility-administered medications for this visit.  (Other)      REVIEW OF SYSTEMS: ROS    Positive for: Cardiovascular, Eyes   Negative for: Constitutional, Gastrointestinal, Neurological, Skin, Genitourinary, Musculoskeletal, HENT, Endocrine, Respiratory, Psychiatric, Allergic/Imm, Heme/Lymph   Last edited by Doneen Poisson on 01/24/2019  8:56 AM. (History)       ALLERGIES Allergies  Allergen Reactions  . Crestor [Rosuvastatin]   . Lipitor [Atorvastatin]   . Penicillins     REACTION: swelling/hives Has patient had a PCN reaction causing immediate rash, facial/tongue/throat swelling, SOB or  lightheadedness with hypotension:yes Has patient had a PCN reaction causing severe rash involving mucus membranes or skin necrosis: Yes Has patient had a PCN reaction that required hospitalization No Has patient had a PCN reaction occurring within the last 10 years: No If all of the above answers are "NO", then may proceed with Cephalosporin use.   . Symbicort [Budesonide-Formoterol Fumarate]     Pain  Behind ribs    PAST MEDICAL HISTORY Past Medical History:  Diagnosis Date  . Anxiety   . GERD (gastroesophageal reflux disease)   . Hypertension    Past Surgical History:  Procedure Laterality Date  . 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 SPINE SURGERY      FAMILY HISTORY Family History  Problem Relation Age of Onset  . Healthy Mother   . Transient ischemic attack Father   . Rheum arthritis Daughter   . Amblyopia Neg Hx   . Blindness Neg Hx   . Cataracts Neg Hx   . Glaucoma Neg Hx   . Macular degeneration Neg Hx   . Retinal detachment Neg Hx   . Strabismus Neg Hx   . Retinitis pigmentosa Neg Hx     SOCIAL HISTORY Social History   Tobacco Use  . Smoking status: Former Smoker    Last attempt to quit: 02/23/1972    Years since quitting: 46.9  . Smokeless tobacco: Never Used  Substance Use Topics  . Alcohol use: Yes    Alcohol/week: 1.0 standard drinks    Types: 1 Standard drinks or equivalent per week    Comment: occasional beer  . Drug use: No         OPHTHALMIC EXAM:  Base Eye Exam    Visual Acuity (Snellen - Linear)      Right Left   Dist cc 20/25 -2 20/20 -2   Dist ph cc NI    Correction:  Glasses       Tonometry (Tonopen, 8:59 AM)      Right Left   Pressure 15 15       Pupils      Dark Light Shape React APD   Right 3.5 3 Round 1+ 0   Left 3.5 3 Round 1+        Extraocular Movement      Right Left    Full Full       Neuro/Psych    Oriented x3:  Yes   Mood/Affect:  Normal       Dilation    Both eyes:  1.0% Mydriacyl, 2.5% Phenylephrine @ 8:59 AM        Slit Lamp and Fundus Exam    Slit Lamp Exam      Right Left   Lids/Lashes Dermatochalasis - upper lid Dermatochalasis - upper lid   Conjunctiva/Sclera White and quiet White and quiet   Cornea 1+ Punctate epithelial erosions, mild Arcus 1+ Punctate epithelial erosions, mild Arcus   Anterior Chamber deep and clear deep and clear   Iris Round and dilated Round and dilated, No NVI   Lens 2+ Nuclear sclerosis, 3+ Cortical cataract 2+ Nuclear sclerosis, 3+ Cortical cataract   Vitreous Vitreous  syneresis Vitreous syneresis       Fundus Exam      Right Left   Disc Pink and Sharp Pink and Sharp, Peripapillary atrophy, Sharp rim  C/D Ratio 0.65 0.6   Macula Good foveal reflex, Retinal pigment epithelial mottling, No heme or edema Flat, Blunted foveal reflex, Retinal pigment epithelial mottling, No heme or edema   Vessels Vascular attenuation Vascular attenuation   Periphery Attached, reticular degeneration, small operculated hole at 0845 with surrounding pigment, no SRF Attached; reticular degeneration          IMAGING AND PROCEDURES  Imaging and Procedures for @TODAY @  OCT, Retina - OU - Both Eyes       Right Eye Quality was good. Central Foveal Thickness: 268. Progression has been stable. Findings include normal foveal contour, no SRF, no IRF (Partial PVD).   Left Eye Quality was good. Central Foveal Thickness: 262. Findings include normal foveal contour, no SRF, no IRF, intraretinal hyper-reflective material (IRHM superior macula -- improved, now with retinal atrophy; partial PVD).   Notes *Images captured and stored on drive  Diagnosis / Impression:  NFP; no IRF/SRF OU OS: BRAO sequela -- inner retinal hyper-reflectivity in superior macula improved, now inner retinal atrophy  Clinical management:  See below  Abbreviations: NFP - Normal foveal profile. CME - cystoid macular edema. PED - pigment epithelial detachment. IRF - intraretinal fluid. SRF - subretinal fluid. EZ - ellipsoid zone. ERM - epiretinal membrane. ORA - outer retinal atrophy. ORT - outer retinal tubulation. 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  Left internal carotid artery occlusion.     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 Retinal edema H35.81 OCT, Retina - OU - Both Eyes  3. Operculated retinal tear of right eye  H33.311   4. Essential hypertension I10   5. 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 Combined forms of age-related cataract of both eyes H25.813     1,2. BRAO OS - focal retinal whitening on exam and hyperreflectivity on OCT superior macula consistent with branch retinal artery occlusion -- improved today - pt has corresponding focal blurriness in inferior visual field, while fovea remains unaffected at this time and BCVA remains 20/20 OS - no embolus seen on exam - Fluorescein angiogram shows delayed filling time and vascular perfusion defect in superior macula and peripheral retina distal to BRAO - pt has a known history of left internal carotid artery occlusion by carotid ultrasound done on 02/14/2018 -- no referral made to vascular surgery at the time - discussed case with Dr. Scot Dock, vascular surgeon, who saw pt in clinic on 01/04/2019 -- recommended monitoring - also saw primary care provider, [name], FNP, on 01/20/2019 - today BRAO seems to have stabilized out of acute phase -- BCVA remains 20/20, but now with inner retinal atrophy in superior macula above fovea corresponding to area of BRAO - cont Brimonidine BID OS - f/u here in 3 months, DFE, OCT, HVF 10-24  3. Operculated retinal hole OD - small, round hole with surrounding pigment and overlying opercula at 0300 - asymptomatic, no SRF, low risk - will monitor for now -- possible laser once OS stable from above  4,5. Hypertensive retinopathy OU  - discussed importance of tight BP control  - monitor  6. 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  - discussed diagnosis and progression  - not yet  visually significant  - monitor for now   Ophthalmic Meds Ordered this visit:  No orders of the defined types were placed in this encounter.      Return for f/u BRAO, DFE, OCT, HVF 10-24.  There are no Patient  Instructions on file for this visit.   Explained the diagnoses, plan, and follow up with the patient and they expressed understanding.  Patient expressed understanding of the importance of proper follow up care.   This document serves as a record of services personally performed by Gardiner Sleeper, MD, PhD. It was created on their behalf by Ernest Mallick, OA, an ophthalmic assistant. The creation of this record is the provider's dictation and/or activities during the visit.    Electronically signed by: Ernest Mallick, OA  03.08.2020 1:23 PM     Gardiner Sleeper, M.D., Ph.D. Diseases & Surgery of the Retina and Vitreous [facility]  I have reviewed the above documentation for accuracy and completeness, and I agree with the above. Gardiner Sleeper, M.D., Ph.D. 01/24/19 1:23 PM     Abbreviations: M myopia (nearsighted); A astigmatism; H hyperopia (farsighted); P presbyopia; Mrx spectacle prescription;  CTL contact lenses; OD right eye; OS left eye; OU both eyes  XT exotropia; ET esotropia; PEK punctate epithelial keratitis; PEE punctate epithelial erosions; DES dry eye syndrome; MGD meibomian gland dysfunction; ATs artificial tears; PFAT's preservative free artificial tears; [place] nuclear sclerotic cataract; PSC posterior subcapsular cataract; ERM epi-retinal membrane; PVD posterior vitreous detachment; RD retinal detachment; DM diabetes mellitus; DR diabetic retinopathy; NPDR non-proliferative diabetic retinopathy; PDR proliferative diabetic retinopathy; CSME clinically significant macular edema; DME diabetic macular edema; dbh dot blot hemorrhages; CWS cotton wool spot; POAG primary open angle glaucoma; C/D cup-to-disc ratio; HVF humphrey visual field; GVF goldmann visual field; OCT optical coherence tomography; IOP intraocular pressure; BRVO Branch retinal vein occlusion; CRVO central retinal vein occlusion; CRAO central retinal artery occlusion; BRAO branch retinal artery  occlusion; RT retinal tear; SB scleral buckle; PPV pars plana vitrectomy; VH Vitreous hemorrhage; PRP panretinal laser photocoagulation; IVK intravitreal kenalog; VMT vitreomacular traction; MH Macular hole;  NVD neovascularization of the disc; NVE neovascularization elsewhere; AREDS age related eye disease study; ARMD age related macular degeneration; POAG primary open angle glaucoma; EBMD epithelial/anterior basement membrane dystrophy; ACIOL anterior chamber intraocular lens; IOL intraocular lens; PCIOL posterior chamber intraocular lens; Phaco/IOL phacoemulsification with intraocular lens placement; [place] photorefractive keratectomy; LASIK laser assisted in situ keratomileusis; HTN hypertension; DM diabetes mellitus; COPD chronic obstructive pulmonary disease

## 2019-01-23 ENCOUNTER — Telehealth: Payer: Self-pay | Admitting: Nurse Practitioner

## 2019-01-23 ENCOUNTER — Other Ambulatory Visit: Payer: Self-pay | Admitting: Nurse Practitioner

## 2019-01-23 NOTE — Telephone Encounter (Signed)
Aware of results per message.

## 2019-01-24 ENCOUNTER — Ambulatory Visit (INDEPENDENT_AMBULATORY_CARE_PROVIDER_SITE_OTHER): Payer: Medicare Other | Admitting: Ophthalmology

## 2019-01-24 ENCOUNTER — Encounter (INDEPENDENT_AMBULATORY_CARE_PROVIDER_SITE_OTHER): Payer: Self-pay | Admitting: Ophthalmology

## 2019-01-24 DIAGNOSIS — H33311 Horseshoe tear of retina without detachment, right eye: Secondary | ICD-10-CM

## 2019-01-24 DIAGNOSIS — H25813 Combined forms of age-related cataract, bilateral: Secondary | ICD-10-CM

## 2019-01-24 DIAGNOSIS — H35033 Hypertensive retinopathy, bilateral: Secondary | ICD-10-CM | POA: Diagnosis not present

## 2019-01-24 DIAGNOSIS — I10 Essential (primary) hypertension: Secondary | ICD-10-CM

## 2019-01-24 DIAGNOSIS — H34232 Retinal artery branch occlusion, left eye: Secondary | ICD-10-CM

## 2019-01-24 DIAGNOSIS — H3581 Retinal edema: Secondary | ICD-10-CM

## 2019-02-04 IMAGING — US US CAROTID DUPLEX BILAT
1 series · 13 of 24 positions shown · non-contrast
Comparison: None.

CLINICAL DATA: Bilateral neck bruit

EXAM:
BILATERAL CAROTID DUPLEX ULTRASOUND
TECHNIQUE: Gray scale imaging, color Doppler and duplex ultrasound were
performed of bilateral carotid and vertebral arteries in the neck.

[Series 1: us carotid duplex bilat · 0.07mm/px · 13 of 85 slices shown]
[im 1/85]
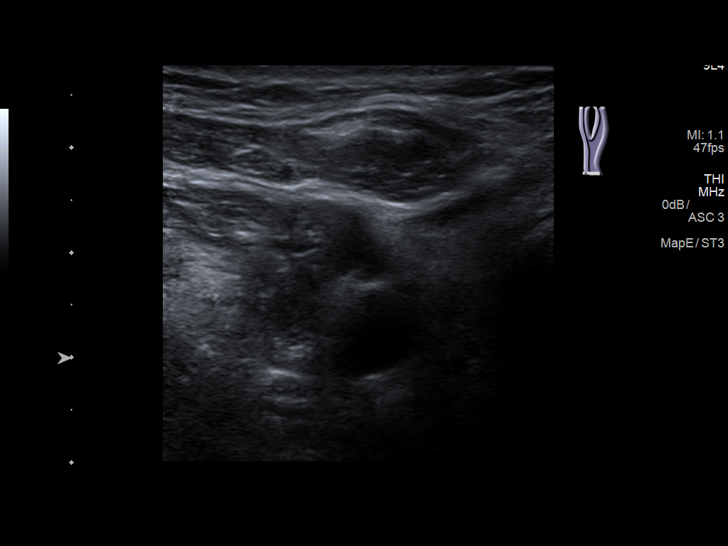
[im 8/85]
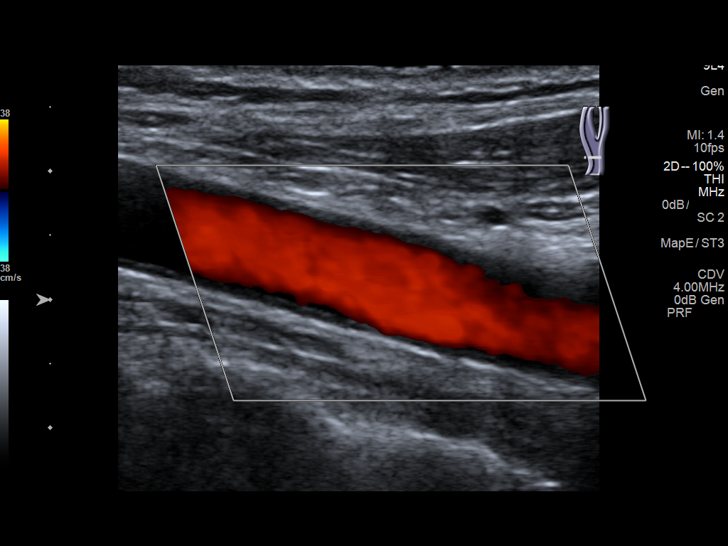
[im 15/85]
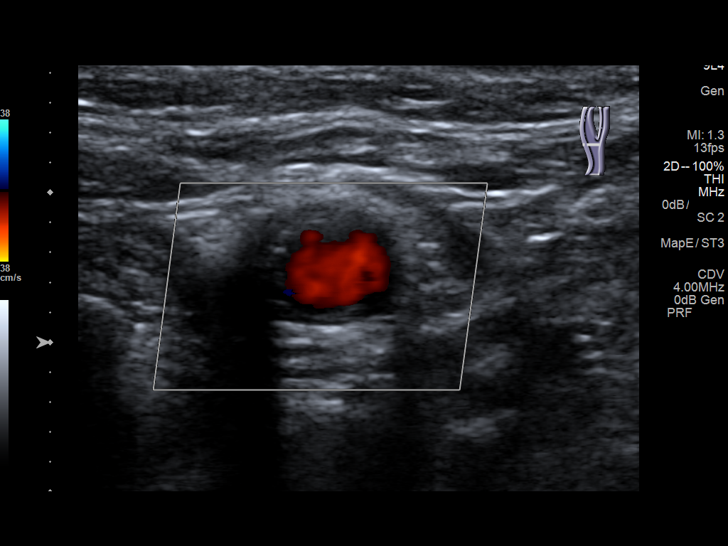
[im 22/85]
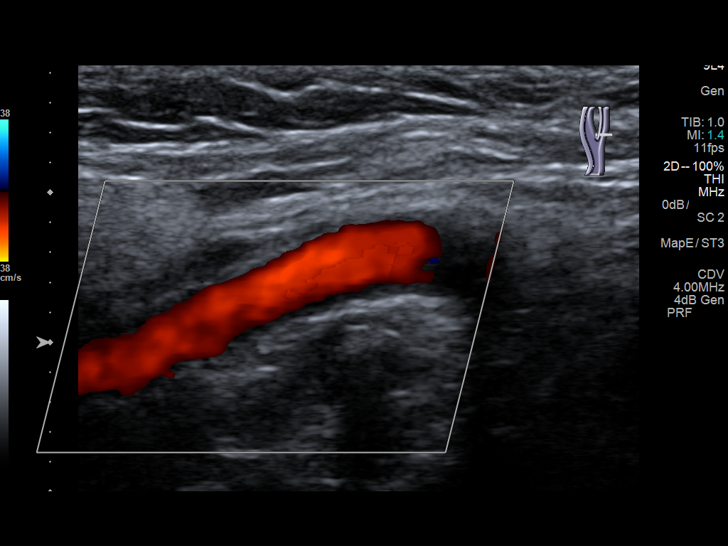
[im 30/85]
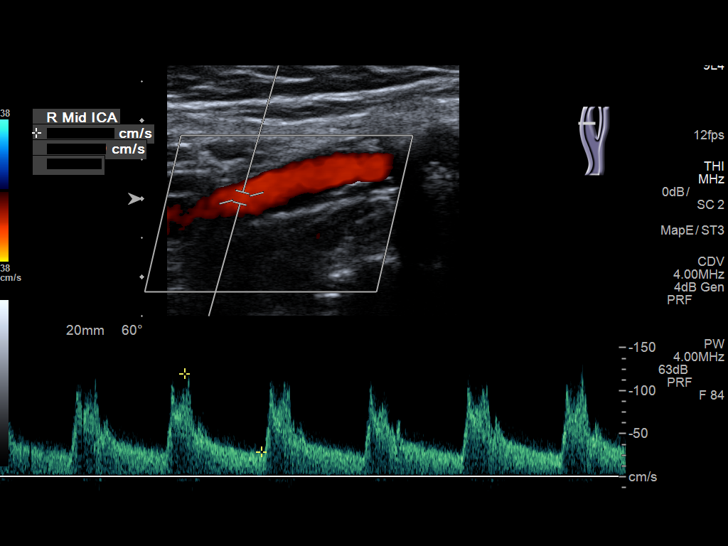
[im 37/85]
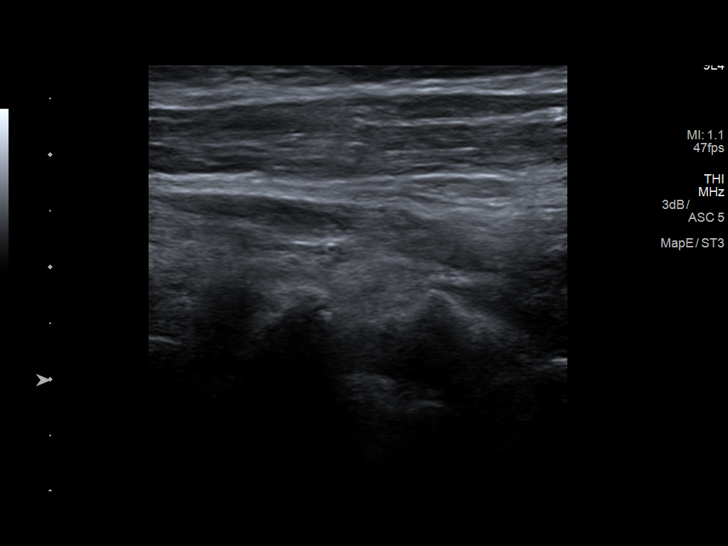
[im 44/85]
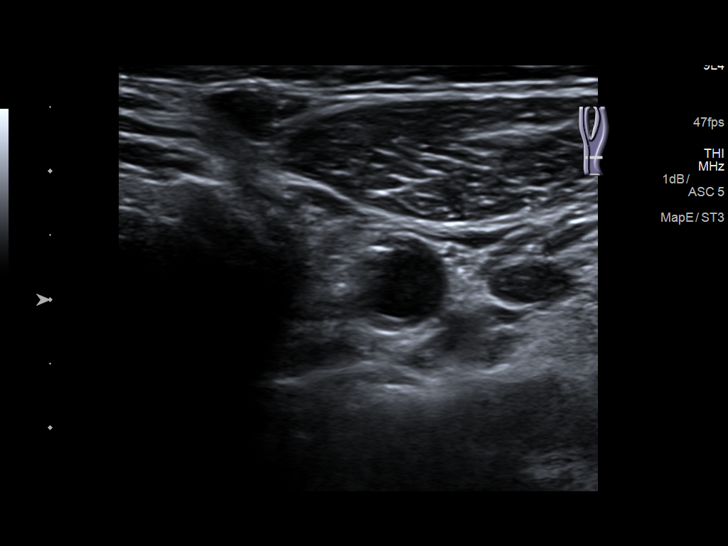
[im 48/85]
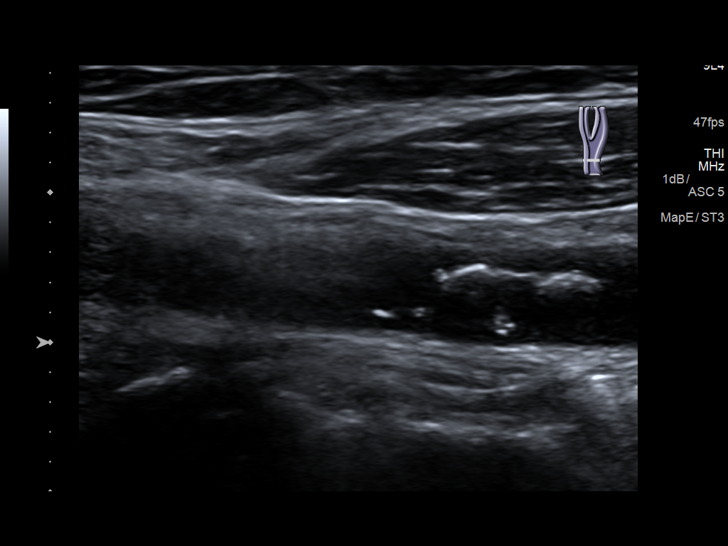
[im 55/85]
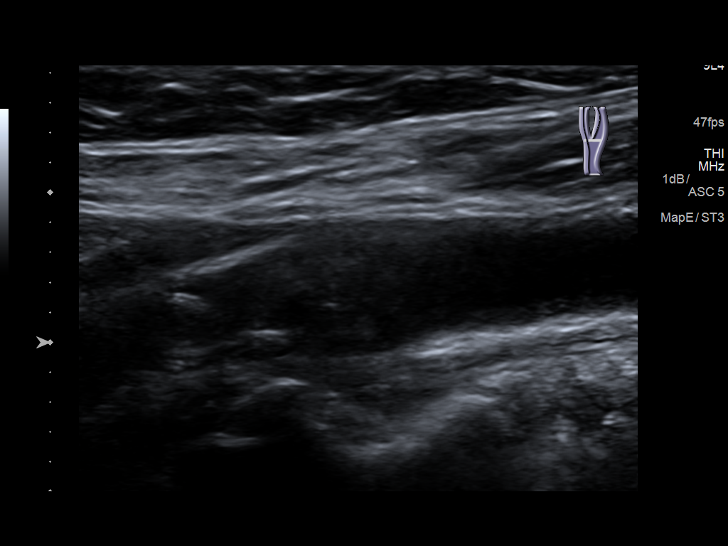
[im 63/85]
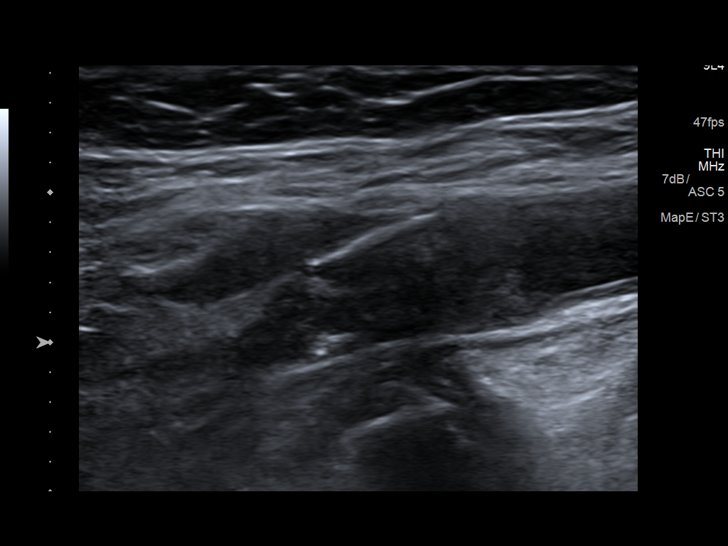
[im 70/85]
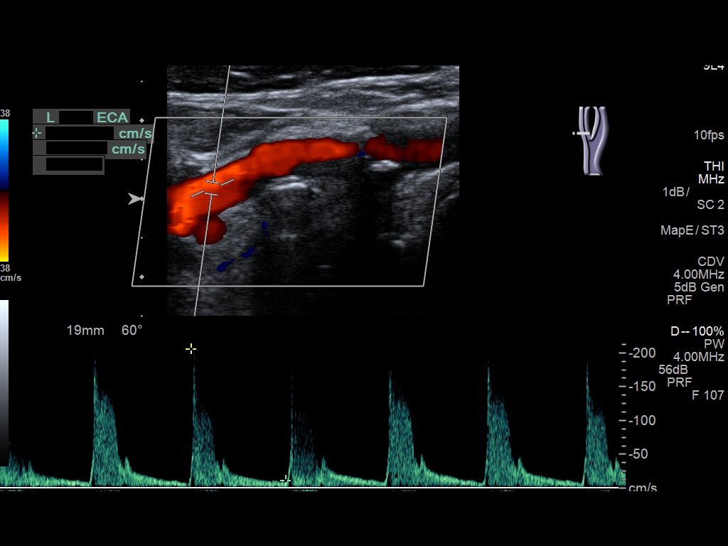
[im 77/85]
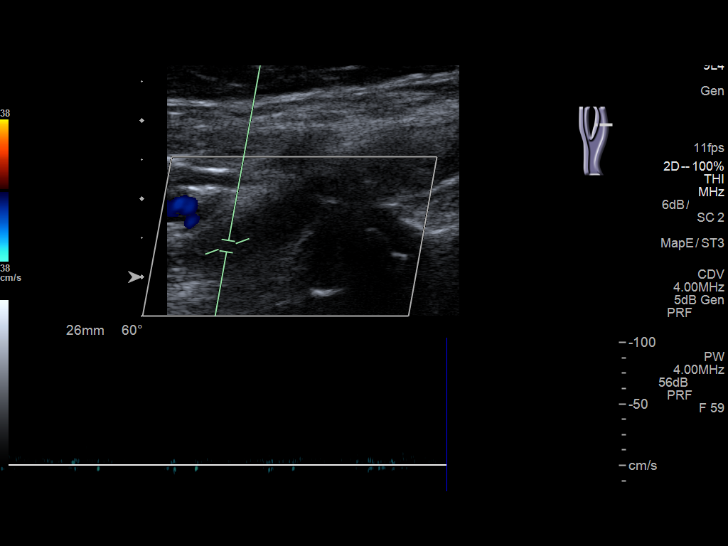
[im 85/85]
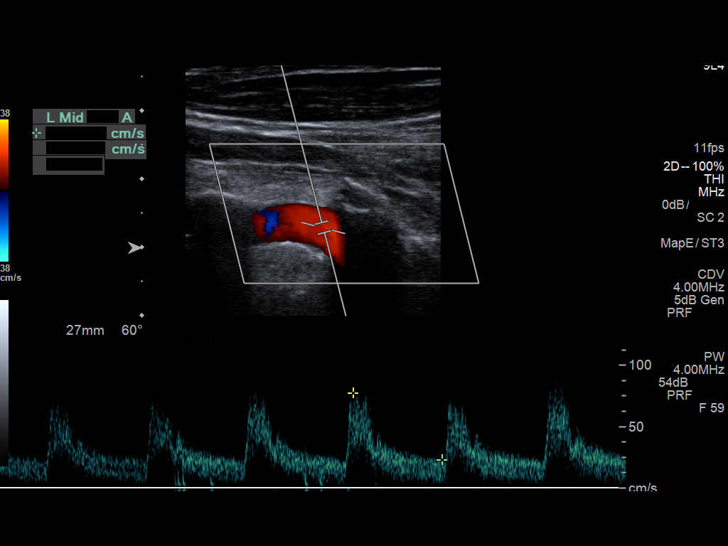

[13 of 24 positions shown; findings below may reference images not displayed]

FINDINGS: Criteria: Quantification of carotid stenosis is based on velocity
parameters that correlate the residual internal carotid diameter
with NASCET-based stenosis levels, using the diameter of the distal
internal carotid lumen as the denominator for stenosis measurement.

The following velocity measurements were obtained:

RIGHT

ICA:  146 cm/sec

CCA:  83 cm/sec

SYSTOLIC ICA/CCA RATIO:

DIASTOLIC ICA/CCA RATIO:

ECA:  110 cm/sec

LEFT

ICA:  Occluded cm/sec

CCA:  44 cm/sec

SYSTOLIC ICA/CCA RATIO:  Not applicable

DIASTOLIC ICA/CCA RATIO:  Not applicable

ECA:  206 cm/sec

RIGHT CAROTID ARTERY: There is moderate calcified plaque in the bulb
which obscures the lumen with shadowing. Low resistance internal
carotid Doppler pattern.

RIGHT VERTEBRAL ARTERY:  Antegrade.

LEFT CAROTID ARTERY: There is scattered calcified plaque in the
common carotid artery and bulb. Above the bulb, the left internal
carotid artery is occluded.

LEFT VERTEBRAL ARTERY:  Antegrade.
IMPRESSION: 50-69% stenosis in the right internal carotid artery.

Left internal carotid artery occlusion.

## 2019-02-09 DIAGNOSIS — M65312 Trigger thumb, left thumb: Secondary | ICD-10-CM | POA: Diagnosis not present

## 2019-02-09 DIAGNOSIS — M79645 Pain in left finger(s): Secondary | ICD-10-CM | POA: Diagnosis not present

## 2019-02-23 ENCOUNTER — Other Ambulatory Visit: Payer: Self-pay | Admitting: *Deleted

## 2019-02-23 DIAGNOSIS — R42 Dizziness and giddiness: Secondary | ICD-10-CM

## 2019-02-23 MED ORDER — MECLIZINE HCL 25 MG PO TABS: ORAL_TABLET | ORAL | 1 refills | Status: DC

## 2019-02-23 NOTE — Telephone Encounter (Signed)
Requesting refill on meclizine.  Per refill protocol will send to PCP for authorization.

## 2019-04-25 ENCOUNTER — Encounter: Payer: Self-pay | Admitting: Nurse Practitioner

## 2019-04-25 ENCOUNTER — Other Ambulatory Visit: Payer: Self-pay

## 2019-04-25 ENCOUNTER — Ambulatory Visit (INDEPENDENT_AMBULATORY_CARE_PROVIDER_SITE_OTHER): Payer: Medicare Other | Admitting: Nurse Practitioner

## 2019-04-25 DIAGNOSIS — E785 Hyperlipidemia, unspecified: Secondary | ICD-10-CM

## 2019-04-25 DIAGNOSIS — F411 Generalized anxiety disorder: Secondary | ICD-10-CM | POA: Diagnosis not present

## 2019-04-25 DIAGNOSIS — Z6831 Body mass index (BMI) 31.0-31.9, adult: Secondary | ICD-10-CM

## 2019-04-25 DIAGNOSIS — K219 Gastro-esophageal reflux disease without esophagitis: Secondary | ICD-10-CM | POA: Diagnosis not present

## 2019-04-25 DIAGNOSIS — I10 Essential (primary) hypertension: Secondary | ICD-10-CM | POA: Diagnosis not present

## 2019-04-25 DIAGNOSIS — R3911 Hesitancy of micturition: Secondary | ICD-10-CM

## 2019-04-25 DIAGNOSIS — N400 Enlarged prostate without lower urinary tract symptoms: Secondary | ICD-10-CM

## 2019-04-25 DIAGNOSIS — R42 Dizziness and giddiness: Secondary | ICD-10-CM

## 2019-04-25 MED ORDER — SIMVASTATIN 40 MG PO TABS: 40.0000 mg | ORAL_TABLET | Freq: Every day | ORAL | 1 refills | Status: DC

## 2019-04-25 MED ORDER — FUROSEMIDE 20 MG PO TABS: 20.0000 mg | ORAL_TABLET | Freq: Every day | ORAL | 1 refills | Status: DC

## 2019-04-25 MED ORDER — ALPRAZOLAM 1 MG PO TABS: 1.0000 mg | ORAL_TABLET | Freq: Two times a day (BID) | ORAL | 2 refills | Status: DC | PRN

## 2019-04-25 MED ORDER — ESCITALOPRAM OXALATE 10 MG PO TABS: 20.0000 mg | ORAL_TABLET | Freq: Every day | ORAL | 1 refills | Status: DC

## 2019-04-25 MED ORDER — TAMSULOSIN HCL 0.4 MG PO CAPS: 0.4000 mg | ORAL_CAPSULE | Freq: Every day | ORAL | 1 refills | Status: DC

## 2019-04-25 NOTE — Progress Notes (Signed)
Virtual Visit via telephone Note  I connected with Francisco [name] on 04/25/19 at 10:55 AM by telephone and verified that I am speaking with the correct person using two identifiers. Francisco [name] is currently located at home and no one is currently with her during visit. The provider, [name], FNP is located in their office at time of visit.  I discussed the limitations, risks, security and privacy concerns of performing an evaluation and management service by telephone and the availability of in person appointments. I also discussed with the patient that there may be a patient responsible charge related to this service. The patient expressed understanding and agreed to proceed.   History and Present Illness:   Chief Complaint: medical management if chronic issues   HPI:  1. Essential hypertension No c/o chest pain, sob or headache. Does not check blood pressure at home. BP Readings from Last 3 Encounters:  01/20/19 132/74  01/04/19 127/77  10/10/18 (!) 146/90     2. Hyperlipidemia, unspecified hyperlipidemia type Does not really watch diet and does very little exercise  3. Gastroesophageal reflux disease without esophagitis Not having any symptoms.  4. GAD (generalized anxiety disorder) Has been doing better. Takes xanax BID and that helps him to rest.  5. Benign prostatic hyperplasia, unspecified whether lower urinary tract symptoms present Takes flomax daily and is having no problems going to the restroom.  6. Vertigo Has resolved  7. BMI 31.0-31.9,adult No recent weight changes    Outpatient Encounter Medications as of 04/25/2019  Medication Sig  . ALPRAZolam (XANAX) 1 MG tablet Take 1 tablet (1 mg total) by mouth 2 (two) times daily as needed.  Marland Kitchen aspirin 81 MG tablet Take 81 mg by mouth daily.  . brimonidine (ALPHAGAN) 0.2 % ophthalmic solution Place 1 drop into the left eye 2 (two) times daily.  Marland Kitchen escitalopram (LEXAPRO) 10 MG tablet Take 2 tablets  (20 mg total) by mouth daily.  . fish oil-omega-3 fatty acids 1000 MG capsule Take 2 g by mouth daily.  . furosemide (LASIX) 20 MG tablet Take 1 tablet (20 mg total) by mouth daily.  . meclizine (ANTIVERT) 25 MG tablet TAKE 1 TABLET BY MOUTH THREE TIMES DAILY AS NEEDED FOR  DIZZINESS  . meloxicam (MOBIC) 7.5 MG tablet TAKE 1 TABLET BY MOUTH ONCE DAILY  . simvastatin (ZOCOR) 40 MG tablet Take 1 tablet (40 mg total) by mouth at bedtime.  . tamsulosin (FLOMAX) 0.4 MG CAPS capsule Take 1 capsule (0.4 mg total) by mouth daily.     Past Surgical History:  Procedure Laterality Date  . AMPUTATION Left 08/05/2016   Procedure: REVISION AMPUTATION LEFT INDEX FINGER REPAIR LACERATION LEFT THUMB;  Surgeon: [name], MD;  Location: [facility];  Service: Orthopedics;  Laterality: Left;  . SPINE SURGERY      Family History  Problem Relation Age of Onset  . Healthy Mother   . Transient ischemic attack Father   . Rheum arthritis Daughter   . Amblyopia Neg Hx   . Blindness Neg Hx   . Cataracts Neg Hx   . Glaucoma Neg Hx   . Macular degeneration Neg Hx   . Retinal detachment Neg Hx   . Strabismus Neg Hx   . Retinitis pigmentosa Neg Hx     New complaints: None today  Social history: Has a beach house that he has not been to since last year.      Review of Systems  Constitutional: Negative for diaphoresis and  weight loss.  Eyes: Negative for blurred vision, double vision and pain.  Respiratory: Negative for shortness of breath.   Cardiovascular: Negative for chest pain, palpitations, orthopnea and leg swelling.  Gastrointestinal: Negative for abdominal pain.  Skin: Negative for rash.  Neurological: Negative for dizziness, sensory change, loss of consciousness, weakness and headaches.  Endo/Heme/Allergies: Negative for polydipsia. Does not bruise/bleed easily.  Psychiatric/Behavioral: Negative for memory loss. The patient does not have insomnia.   All other systems reviewed and are  negative.    Observations/Objective: Alert and oriented- answers all questions appropriately No distress  Assessment and Plan: RYLON POITRA comes in today with chief complaint of Medical Management of Chronic Issues   Diagnosis and orders addressed:  1. Essential hypertension Ow sodium diet - furosemide (LASIX) 20 MG tablet; Take 1 tablet (20 mg total) by mouth daily.  Dispense: 90 tablet; Refill: 1  2. Hyperlipidemia, unspecified hyperlipidemia type Low fat diet - simvastatin (ZOCOR) 40 MG tablet; Take 1 tablet (40 mg total) by mouth at bedtime.  Dispense: 90 tablet; Refill: 1  3. Gastroesophageal reflux disease without esophagitis Avoid spicy foods Do not eat 2 hours prior to bedtime  4. GAD (generalized anxiety disorder) Stress management - escitalopram (LEXAPRO) 10 MG tablet; Take 2 tablets (20 mg total) by mouth daily.  Dispense: 180 tablet; Refill: 1 - ALPRAZolam (XANAX) 1 MG tablet; Take 1 tablet (1 mg total) by mouth 2 (two) times daily as needed.  Dispense: 60 tablet; Refill: 2  5. Benign prostatic hyperplasia, unspecified whether lower urinary tract symptoms present report inability to void - tamsulosin (FLOMAX) 0.4 MG CAPS capsule; Take 1 capsule (0.4 mg total) by mouth daily.  Dispense: 90 capsule; Refill: 1 - tamsulosin (FLOMAX) 0.4 MG CAPS capsule; Take 1 capsule (0.4 mg total) by mouth daily.  Dispense: 90 capsule; Refill: 1   6. Vertigo Let us know if returns  7. BMI 31.0-31.9,adult Discussed diet and exercise for person with BMI >25 Will recheck weight in 3-6 months   Previous lab results reviewed Health Maintenance reviewed Diet and exercise encouraged  Follow up plan: 3 months      I discussed the assessment and treatment plan with the patient. The patient was provided an opportunity to ask questions and all were answered. The patient agreed with the plan and demonstrated an understanding of the instructions.   The patient was advised to  call back or seek an in-person evaluation if the symptoms worsen or if the condition fails to improve as anticipated.  The above assessment and management plan was discussed with the patient. The patient verbalized understanding of and has agreed to the management plan. Patient is aware to call the clinic if symptoms persist or worsen. Patient is aware when to return to the clinic for a follow-up visit. Patient educated on when it is appropriate to go to the emergency department.   Time call ended:  11:10  I provided 15 minutes of non-face-to-face time during this encounter.    [name], FNP

## 2019-04-26 ENCOUNTER — Encounter (INDEPENDENT_AMBULATORY_CARE_PROVIDER_SITE_OTHER): Payer: Medicare Other | Admitting: Ophthalmology

## 2019-06-06 DIAGNOSIS — Z1159 Encounter for screening for other viral diseases: Secondary | ICD-10-CM | POA: Diagnosis not present

## 2019-07-11 ENCOUNTER — Other Ambulatory Visit: Payer: Self-pay | Admitting: Nurse Practitioner

## 2019-07-17 ENCOUNTER — Other Ambulatory Visit: Payer: Self-pay | Admitting: Nurse Practitioner

## 2019-07-17 DIAGNOSIS — R42 Dizziness and giddiness: Secondary | ICD-10-CM

## 2019-07-27 DIAGNOSIS — H2513 Age-related nuclear cataract, bilateral: Secondary | ICD-10-CM | POA: Diagnosis not present

## 2019-07-27 DIAGNOSIS — H40033 Anatomical narrow angle, bilateral: Secondary | ICD-10-CM | POA: Diagnosis not present

## 2019-07-31 ENCOUNTER — Ambulatory Visit: Payer: Self-pay | Admitting: Nurse Practitioner

## 2019-07-31 ENCOUNTER — Other Ambulatory Visit: Payer: Self-pay

## 2019-08-01 ENCOUNTER — Ambulatory Visit (INDEPENDENT_AMBULATORY_CARE_PROVIDER_SITE_OTHER): Payer: Medicare Other | Admitting: Nurse Practitioner

## 2019-08-01 ENCOUNTER — Encounter: Payer: Self-pay | Admitting: Nurse Practitioner

## 2019-08-01 VITALS — BP 111/64 | HR 60 | Temp 97.1°F | Resp 16 | Ht 69.0 in | Wt 203.0 lb

## 2019-08-01 DIAGNOSIS — E785 Hyperlipidemia, unspecified: Secondary | ICD-10-CM | POA: Diagnosis not present

## 2019-08-01 DIAGNOSIS — I10 Essential (primary) hypertension: Secondary | ICD-10-CM

## 2019-08-01 DIAGNOSIS — F411 Generalized anxiety disorder: Secondary | ICD-10-CM

## 2019-08-01 DIAGNOSIS — K219 Gastro-esophageal reflux disease without esophagitis: Secondary | ICD-10-CM | POA: Diagnosis not present

## 2019-08-01 DIAGNOSIS — Z6831 Body mass index (BMI) 31.0-31.9, adult: Secondary | ICD-10-CM

## 2019-08-01 DIAGNOSIS — R3911 Hesitancy of micturition: Secondary | ICD-10-CM

## 2019-08-01 DIAGNOSIS — Z23 Encounter for immunization: Secondary | ICD-10-CM | POA: Diagnosis not present

## 2019-08-01 DIAGNOSIS — N400 Enlarged prostate without lower urinary tract symptoms: Secondary | ICD-10-CM

## 2019-08-01 DIAGNOSIS — J45909 Unspecified asthma, uncomplicated: Secondary | ICD-10-CM | POA: Diagnosis not present

## 2019-08-01 MED ORDER — TAMSULOSIN HCL 0.4 MG PO CAPS: 0.4000 mg | ORAL_CAPSULE | Freq: Every day | ORAL | 1 refills | Status: DC

## 2019-08-01 MED ORDER — ESCITALOPRAM OXALATE 10 MG PO TABS: 20.0000 mg | ORAL_TABLET | Freq: Every day | ORAL | 1 refills | Status: DC

## 2019-08-01 MED ORDER — SIMVASTATIN 40 MG PO TABS: 40.0000 mg | ORAL_TABLET | Freq: Every day | ORAL | 1 refills | Status: DC

## 2019-08-01 MED ORDER — ALPRAZOLAM 1 MG PO TABS: 1.0000 mg | ORAL_TABLET | Freq: Two times a day (BID) | ORAL | 2 refills | Status: DC | PRN

## 2019-08-01 MED ORDER — FUROSEMIDE 20 MG PO TABS: 20.0000 mg | ORAL_TABLET | Freq: Every day | ORAL | 1 refills | Status: DC

## 2019-08-01 NOTE — Addendum Note (Signed)
Addended by: [name] on: 08/01/2019 12:21 PM   Modules accepted: Orders

## 2019-08-01 NOTE — Progress Notes (Signed)
Subjective:    Patient ID: Francisco [name], male    DOB: [birth year], 78 y.o.   MRN: [id number]   Chief Complaint: Medical Management of Chronic Issues    HPI:  1. Essential hypertension No c/o chest pain, sob or headache. Does not check blood pressure at home. BP Readings from Last 3 Encounters:  08/01/19 111/64  01/20/19 132/74  01/04/19 127/77     2. Hyperlipidemia, unspecified hyperlipidemia type Does try to watch diet. Does not do much exercsie. Lab Results  Component Value Date   CHOL 144 01/20/2019   HDL 43 01/20/2019   LDLCALC 76 01/20/2019   TRIG 127 01/20/2019   CHOLHDL 3.3 01/20/2019     3. Gastroesophageal reflux disease without esophagitis He is doing well. On no rx meds- has not had any recent symptoms.  4. Chronic asthma without complication, unspecified asthma severity, unspecified whether persistent No problems breathing. Doe snot go out in heat of day because etat will make him a little SOB.  5. Benign prostatic hyperplasia, unspecified whether lower urinary tract symptoms present Is on flomax. Has not seen urologist in several years. Says that he is having no problems passing his urine.  6. GAD (generalized anxiety disorder) Is on xanax BID and lexapro daily, is doing well. GAD 7 : Generalized Anxiety Score 08/01/2019 09/05/2018 04/23/2016  Nervous, Anxious, on Edge 0 1 1  Control/stop worrying 0 2 1  Worry too much - different things 0 0 1  Trouble relaxing 0 0 1  Restless 0 0 1  Easily annoyed or irritable 0 0 0  Afraid - awful might happen 0 0 1  Total GAD 7 Score 0 3 6  Anxiety Difficulty - Not difficult at all Somewhat difficult   Depression screen [facility] 2/9 08/01/2019 04/25/2019 01/20/2019  Decreased Interest 0 1 0  Down, Depressed, Hopeless 0 1 0  PHQ - 2 Score 0 2 0  Altered sleeping - 0 -  Tired, decreased energy - 0 -  Change in appetite - 0 -  Feeling bad or failure about yourself  - 1 -  Trouble concentrating - 0 -  Moving slowly or  fidgety/restless - 0 -  Suicidal thoughts - 0 -  PHQ-9 Score - 3 -  Difficult doing work/chores - Not difficult at all -  Some recent data might be hidden      7. BMI 31.0-31.9,adult No recent weight changes Wt Readings from Last 3 Encounters:  08/01/19 203 lb (92.1 kg)  01/20/19 202 lb (91.6 kg)  01/04/19 202 lb 2.6 oz (91.7 kg)   BMI Readings from Last 3 Encounters:  08/01/19 29.98 kg/m  01/20/19 29.83 kg/m  01/04/19 29.85 kg/m      Outpatient Encounter Medications as of 08/01/2019  Medication Sig  . ALPRAZolam (XANAX) 1 MG tablet Take 1 tablet (1 mg total) by mouth 2 (two) times daily as needed.  Marland Kitchen aspirin 81 MG tablet Take 81 mg by mouth daily.  . brimonidine (ALPHAGAN) 0.2 % ophthalmic solution Place 1 drop into the left eye 2 (two) times daily.  Marland Kitchen escitalopram (LEXAPRO) 10 MG tablet Take 2 tablets (20 mg total) by mouth daily.  . fish oil-omega-3 fatty acids 1000 MG capsule Take 2 g by mouth daily.  . furosemide (LASIX) 20 MG tablet Take 1 tablet (20 mg total) by mouth daily.  . meclizine (ANTIVERT) 25 MG tablet TAKE 1 TABLET BY MOUTH THREE TIMES DAILY AS NEEDED FOR DIZZINESS  . meloxicam (MOBIC) 7.5  MG tablet Take 1 tablet by mouth once daily  . simvastatin (ZOCOR) 40 MG tablet Take 1 tablet (40 mg total) by mouth at bedtime.  . tamsulosin (FLOMAX) 0.4 MG CAPS capsule Take 1 capsule (0.4 mg total) by mouth daily.     Past Surgical History:  Procedure Laterality Date  . AMPUTATION Left 08/05/2016   Procedure: REVISION AMPUTATION LEFT INDEX FINGER REPAIR LACERATION LEFT THUMB;  Surgeon: [name], MD;  Location: [place];  Service: Orthopedics;  Laterality: Left;  . SPINE SURGERY      Family History  Problem Relation Age of Onset  . Healthy Mother   . Transient ischemic attack Father   . Rheum arthritis Daughter   . Amblyopia Neg Hx   . Blindness Neg Hx   . Cataracts Neg Hx   . Glaucoma Neg Hx   . Macular degeneration Neg Hx   . Retinal detachment Neg Hx    . Strabismus Neg Hx   . Retinitis pigmentosa Neg Hx     New complaints: None today  Social history: Lives alone since his wife passed away 2 years ago. [place] has been [place] to go a couple of times this year  Controlled substance contract: 08/01/19    Review of Systems  Constitutional: Negative for activity change and appetite change.  HENT: Negative.   Eyes: Negative for pain.  Respiratory: Negative for shortness of breath.   Cardiovascular: Negative for chest pain, palpitations and leg swelling.  Gastrointestinal: Negative for abdominal pain.  Endocrine: Negative for polydipsia.  Genitourinary: Negative.   Skin: Negative for rash.  Neurological: Negative for dizziness, weakness and headaches.  Hematological: Does not bruise/bleed easily.  Psychiatric/Behavioral: Negative.   All other systems reviewed and are negative.      Objective:   Physical Exam Vitals signs and nursing note reviewed.  Constitutional:      Appearance: Normal appearance. He is well-developed.  HENT:     Head: Normocephalic.     Nose: Nose normal.  Eyes:     Pupils: Pupils are equal, round, and reactive to light.  Neck:     Musculoskeletal: Normal range of motion and neck supple.     Thyroid: No thyroid mass or thyromegaly.     Vascular: No carotid bruit or JVD.     Trachea: Phonation normal.  Cardiovascular:     Rate and Rhythm: Normal rate and regular rhythm.  Pulmonary:     Effort: Pulmonary effort is normal. No respiratory distress.     Breath sounds: Normal breath sounds.  Abdominal:     General: Bowel sounds are normal.     Palpations: Abdomen is soft.     Tenderness: There is no abdominal tenderness.  Musculoskeletal: Normal range of motion.     Comments: Absence of left index finger  Lymphadenopathy:     Cervical: No cervical adenopathy.  Skin:    General: Skin is warm and dry.  Neurological:     Mental Status: He is alert and oriented to person,  place, and time.  Psychiatric:        Behavior: Behavior normal.        Thought Content: Thought content normal.        Judgment: Judgment normal.    BP 111/64   Pulse 60   Temp (!) 97.1 F (36.2 C) (Oral)   Resp 16   Ht 5\' 9"  (1.753 m)   Wt 203 lb (92.1 kg)   SpO2  97%   BMI 29.98 kg/m          Assessment & Plan:  Francisco [name] comes in today with chief complaint of Medical Management of Chronic Issues   Diagnosis and orders addressed:  1. Essential hypertension Low sodium diet - furosemide (LASIX) 20 MG tablet; Take 1 tablet (20 mg total) by mouth daily.  Dispense: 90 tablet; Refill: 1  2. Hyperlipidemia, unspecified hyperlipidemia type Low fat diet - simvastatin (ZOCOR) 40 MG tablet; Take 1 tablet (40 mg total) by mouth at bedtime.  Dispense: 90 tablet; Refill: 1  3. Gastroesophageal reflux disease without esophagitis Avoid spicy foods Do not eat 2 hours prior to bedtime  4. Chronic asthma without complication, unspecified asthma severity, unspecified whether persistent  5. Benign prostatic hyperplasia, unspecified whether lower urinary tract symptoms present - tamsulosin (FLOMAX) 0.4 MG CAPS capsule; Take 1 capsule (0.4 mg total) by mouth daily.  Dispense: 90 capsule; Refill: 1  6. GAD (generalized anxiety disorder) Stress management - escitalopram (LEXAPRO) 10 MG tablet; Take 2 tablets (20 mg total) by mouth daily.  Dispense: 180 tablet; Refill: 1 - ALPRAZolam (XANAX) 1 MG tablet; Take 1 tablet (1 mg total) by mouth 2 (two) times daily as needed.  Dispense: 60 tablet; Refill: 2  7. BMI 31.0-31.9,adult Discussed diet and exercise for person with BMI >25 Will recheck weight in 3-6 months  8. Urinary hesitancy - tamsulosin (FLOMAX) 0.4 MG CAPS capsule; Take 1 capsule (0.4 mg total) by mouth daily.  Dispense: 90 capsule; Refill: 1   Labs pending Health Maintenance reviewed Diet and exercise encouraged  Follow up plan: 3 months   Mary-Margaret  Hassell Done, FNP

## 2019-08-01 NOTE — Patient Instructions (Signed)

## 2019-08-02 LAB — CMP14+EGFR
ALT: 13 IU/L (ref 0–44)
AST: 17 IU/L (ref 0–40)
Albumin/Globulin Ratio: 2.8 — ABNORMAL HIGH (ref 1.2–2.2)
Albumin: 4.5 g/dL (ref 3.7–4.7)
Alkaline Phosphatase: 48 IU/L (ref 39–117)
BUN/Creatinine Ratio: 15 (ref 10–24)
BUN: 19 mg/dL (ref 8–27)
Bilirubin Total: 0.6 mg/dL (ref 0.0–1.2)
CO2: 26 mmol/L (ref 20–29)
Calcium: 9 mg/dL (ref 8.6–10.2)
Chloride: 105 mmol/L (ref 96–106)
Creatinine, Ser: 1.24 mg/dL (ref 0.76–1.27)
GFR calc Af Amer: 64 mL/min/{1.73_m2} (ref >59)
GFR calc non Af Amer: 55 mL/min/{1.73_m2} — ABNORMAL LOW (ref >59)
Globulin, Total: 1.6 g/dL (ref 1.5–4.5)
Glucose: 91 mg/dL (ref 65–99)
Potassium: 4.3 mmol/L (ref 3.5–5.2)
Sodium: 143 mmol/L (ref 134–144)
Total Protein: 6.1 g/dL (ref 6.0–8.5)

## 2019-08-02 LAB — LIPID PANEL
Chol/HDL Ratio: 3.6 ratio (ref 0.0–5.0)
Cholesterol, Total: 149 mg/dL (ref 100–199)
HDL: 41 mg/dL (ref >39)
LDL Chol Calc (NIH): 79 mg/dL (ref 0–99)
Triglycerides: 169 mg/dL — ABNORMAL HIGH (ref 0–149)
VLDL Cholesterol Cal: 29 mg/dL (ref 5–40)

## 2019-08-03 ENCOUNTER — Other Ambulatory Visit: Payer: Self-pay | Admitting: Nurse Practitioner

## 2019-08-03 DIAGNOSIS — R42 Dizziness and giddiness: Secondary | ICD-10-CM

## 2019-08-15 ENCOUNTER — Other Ambulatory Visit: Payer: Self-pay | Admitting: Nurse Practitioner

## 2019-09-05 ENCOUNTER — Other Ambulatory Visit: Payer: Self-pay | Admitting: Nurse Practitioner

## 2019-09-05 DIAGNOSIS — R42 Dizziness and giddiness: Secondary | ICD-10-CM

## 2019-09-06 ENCOUNTER — Other Ambulatory Visit: Payer: Self-pay | Admitting: Nurse Practitioner

## 2019-09-06 DIAGNOSIS — R42 Dizziness and giddiness: Secondary | ICD-10-CM

## 2019-09-07 DIAGNOSIS — Z1159 Encounter for screening for other viral diseases: Secondary | ICD-10-CM | POA: Diagnosis not present

## 2019-09-21 ENCOUNTER — Other Ambulatory Visit: Payer: Self-pay | Admitting: Nurse Practitioner

## 2019-10-09 ENCOUNTER — Other Ambulatory Visit: Payer: Self-pay | Admitting: Nurse Practitioner

## 2019-10-09 DIAGNOSIS — R42 Dizziness and giddiness: Secondary | ICD-10-CM

## 2019-10-10 ENCOUNTER — Other Ambulatory Visit: Payer: Self-pay | Admitting: Nurse Practitioner

## 2019-10-10 DIAGNOSIS — R42 Dizziness and giddiness: Secondary | ICD-10-CM

## 2019-10-31 ENCOUNTER — Ambulatory Visit (INDEPENDENT_AMBULATORY_CARE_PROVIDER_SITE_OTHER): Payer: Medicare Other | Admitting: Nurse Practitioner

## 2019-10-31 ENCOUNTER — Other Ambulatory Visit: Payer: Self-pay

## 2019-10-31 ENCOUNTER — Encounter: Payer: Self-pay | Admitting: Nurse Practitioner

## 2019-10-31 VITALS — BP 122/73 | HR 59 | Temp 97.5°F | Resp 20 | Ht 69.0 in | Wt 206.0 lb

## 2019-10-31 DIAGNOSIS — Z6831 Body mass index (BMI) 31.0-31.9, adult: Secondary | ICD-10-CM

## 2019-10-31 DIAGNOSIS — I10 Essential (primary) hypertension: Secondary | ICD-10-CM | POA: Diagnosis not present

## 2019-10-31 DIAGNOSIS — F411 Generalized anxiety disorder: Secondary | ICD-10-CM

## 2019-10-31 DIAGNOSIS — N400 Enlarged prostate without lower urinary tract symptoms: Secondary | ICD-10-CM

## 2019-10-31 DIAGNOSIS — E785 Hyperlipidemia, unspecified: Secondary | ICD-10-CM

## 2019-10-31 DIAGNOSIS — K219 Gastro-esophageal reflux disease without esophagitis: Secondary | ICD-10-CM | POA: Diagnosis not present

## 2019-10-31 MED ORDER — ALPRAZOLAM 1 MG PO TABS: 1.0000 mg | ORAL_TABLET | Freq: Two times a day (BID) | ORAL | 2 refills | Status: DC | PRN

## 2019-10-31 NOTE — Patient Instructions (Signed)

## 2019-10-31 NOTE — Progress Notes (Signed)
Subjective:    Patient ID: Francisco [name], male    DOB: 27-Nov-1940, 78 y.o.   MRN: [id number]   Chief Complaint: Medical Management of Chronic Issues    HPI:  1. Essential hypertension No c/o chest pain, sob or headache. Does not check blood pressure at home. BP Readings from Last 3 Encounters:  10/31/19 122/73  08/01/19 111/64  01/20/19 132/74     2. Hyperlipidemia, unspecified hyperlipidemia type Trying to watch diet, but does very little exercise. Lab Results  Component Value Date   CHOL 149 08/01/2019   HDL 41 08/01/2019   LDLCALC 79 08/01/2019   TRIG 169 (H) 08/01/2019   CHOLHDL 3.6 08/01/2019     3. Gastroesophageal reflux disease without esophagitis Only uses OTC meds as needed  4. GAD (generalized anxiety disorder) He takes xana BID and says e gets very anxious when he does nt take, he also takes lexapro daily , with no side effects GAD 7 : Generalized Anxiety Score 10/31/2019 08/01/2019 09/05/2018 04/23/2016  Nervous, Anxious, on Edge 1 0 1 1  Control/stop worrying 1 0 2 1  Worry too much - different things 1 0 0 1  Trouble relaxing 1 0 0 1  Restless 0 0 0 1  Easily annoyed or irritable 0 0 0 0  Afraid - awful might happen 0 0 0 1  Total GAD 7 Score 4 0 3 6  Anxiety Difficulty Somewhat difficult - Not difficult at all Somewhat difficult     5. Benign prostatic hyperplasia, unspecified whether lower urinary tract symptoms present Denies any problems voiding. He takes flomax daily.  6. BMI 31.0-31.9,adult No recent weight changes Wt Readings from Last 3 Encounters:  10/31/19 206 lb (93.4 kg)  08/01/19 203 lb (92.1 kg)  01/20/19 202 lb (91.6 kg)  ' BMI Readings from Last 3 Encounters:  10/31/19 30.42 kg/m  08/01/19 29.98 kg/m  01/20/19 29.83 kg/m       Outpatient Encounter Medications as of 10/31/2019  Medication Sig  . ALPRAZolam (XANAX) 1 MG tablet Take 1 tablet (1 mg total) by mouth 2 (two) times daily as needed.  Marland Kitchen aspirin 81 MG  tablet Take 81 mg by mouth daily.  . brimonidine (ALPHAGAN) 0.2 % ophthalmic solution Place 1 drop into the left eye 2 (two) times daily.  Marland Kitchen escitalopram (LEXAPRO) 10 MG tablet Take 2 tablets (20 mg total) by mouth daily.  . fish oil-omega-3 fatty acids 1000 MG capsule Take 2 g by mouth daily.  . furosemide (LASIX) 20 MG tablet Take 1 tablet (20 mg total) by mouth daily.  . meclizine (ANTIVERT) 25 MG tablet TAKE 1 TABLET BY MOUTH THREE TIMES DAILY AS NEEDED FOR DIZZINESS  . meloxicam (MOBIC) 7.5 MG tablet Take 1 tablet by mouth once daily  . simvastatin (ZOCOR) 40 MG tablet Take 1 tablet (40 mg total) by mouth at bedtime.  . tamsulosin (FLOMAX) 0.4 MG CAPS capsule Take 1 capsule (0.4 mg total) by mouth daily.     Past Surgical History:  Procedure Laterality Date  . AMPUTATION Left 08/05/2016   Procedure: REVISION AMPUTATION LEFT INDEX FINGER REPAIR LACERATION LEFT THUMB;  Surgeon: [name], MD;  Location: [place];  Service: Orthopedics;  Laterality: Left;  . SPINE SURGERY      Family History  Problem Relation Age of Onset  . Healthy Mother   . Transient ischemic attack Father   . Rheum arthritis Daughter   . Amblyopia Neg Hx   . Blindness Neg  Hx   . Cataracts Neg Hx   . Glaucoma Neg Hx   . Macular degeneration Neg Hx   . Retinal detachment Neg Hx   . Strabismus Neg Hx   . Retinitis pigmentosa Neg Hx     New complaints: None today  Social history: Lives by hisself  Controlled substance contract: 08/02/19    Review of Systems  Constitutional: Negative for diaphoresis.  Eyes: Negative for pain.  Respiratory: Negative for shortness of breath.   Cardiovascular: Negative for chest pain, palpitations and leg swelling.  Gastrointestinal: Negative for abdominal pain.  Endocrine: Negative for polydipsia.  Skin: Negative for rash.  Neurological: Negative for dizziness, weakness and headaches.  Hematological: Does not bruise/bleed easily.  All other systems reviewed and are  negative.      Objective:   Physical Exam Vitals and nursing note reviewed.  Constitutional:      Appearance: Normal appearance. He is well-developed.  HENT:     Head: Normocephalic.     Nose: Nose normal.  Eyes:     Pupils: Pupils are equal, round, and reactive to light.  Neck:     Thyroid: No thyroid mass or thyromegaly.     Vascular: No carotid bruit or JVD.     Trachea: Phonation normal.  Cardiovascular:     Rate and Rhythm: Normal rate and regular rhythm.  Pulmonary:     Effort: Pulmonary effort is normal. No respiratory distress.     Breath sounds: Normal breath sounds.  Abdominal:     General: Bowel sounds are normal.     Palpations: Abdomen is soft.     Tenderness: There is no abdominal tenderness.     Hernia: A hernia (midline abdominal wallhernia) is present.  Musculoskeletal:        General: Normal range of motion.     Cervical back: Normal range of motion and neck supple.  Lymphadenopathy:     Cervical: No cervical adenopathy.  Skin:    General: Skin is warm and dry.     Comments: Freckle- unchanged on right lower lip  Neurological:     Mental Status: He is alert and oriented to person, place, and time.  Psychiatric:        Behavior: Behavior normal.        Thought Content: Thought content normal.        Judgment: Judgment normal.    BP 122/73   Pulse (!) 59   Temp (!) 97.5 F (36.4 C) (Temporal)   Resp 20   Ht _0  (1.753 m)   Wt 206 lb (93.4 kg)   SpO2 96%   BMI 30.42 kg/m         Assessment & Plan:  Francisco [name] comes in today with chief complaint of Medical Management of Chronic Issues   Diagnosis and orders addressed:  1. Essential hypertension Low sodium diet - CMP14+EGFR  2. Hyperlipidemia, unspecified hyperlipidemia type Low fat diet - Lipid panel  3. Gastroesophageal reflux disease without esophagitis Avoid spicy foods Do not eat 2 hours prior to bedtime  4. GAD (generalized anxiety disorder) Stress management -  ALPRAZolam (XANAX) 1 MG tablet; Take 1 tablet (1 mg total) by mouth 2 (two) times daily as needed.  Dispense: 60 tablet; Refill: 2  5. Benign prostatic hyperplasia, unspecified whether lower urinary tract symptoms present Continue flomax daily  6. BMI 31.0-31.9,adult Discussed diet and exercise for person with BMI >25 Will recheck weight in 3-6 months   Labs pending Health  Maintenance reviewed Diet and exercise encouraged  Follow up plan: 3 months   [name], FNP

## 2019-11-01 LAB — CMP14+EGFR
ALT: 16 IU/L (ref 0–44)
AST: 16 IU/L (ref 0–40)
Albumin/Globulin Ratio: 2.4 — ABNORMAL HIGH (ref 1.2–2.2)
Albumin: 4.4 g/dL (ref 3.7–4.7)
Alkaline Phosphatase: 50 IU/L (ref 39–117)
BUN/Creatinine Ratio: 18 (ref 10–24)
BUN: 20 mg/dL (ref 8–27)
Bilirubin Total: 0.6 mg/dL (ref 0.0–1.2)
CO2: 26 mmol/L (ref 20–29)
Calcium: 9 mg/dL (ref 8.6–10.2)
Chloride: 102 mmol/L (ref 96–106)
Creatinine, Ser: 1.1 mg/dL (ref 0.76–1.27)
GFR calc Af Amer: 74 mL/min/{1.73_m2} (ref >59)
GFR calc non Af Amer: 64 mL/min/{1.73_m2} (ref >59)
Globulin, Total: 1.8 g/dL (ref 1.5–4.5)
Glucose: 98 mg/dL (ref 65–99)
Potassium: 4.3 mmol/L (ref 3.5–5.2)
Sodium: 141 mmol/L (ref 134–144)
Total Protein: 6.2 g/dL (ref 6.0–8.5)

## 2019-11-01 LAB — LIPID PANEL
Chol/HDL Ratio: 3.8 ratio (ref 0.0–5.0)
Cholesterol, Total: 159 mg/dL (ref 100–199)
HDL: 42 mg/dL (ref >39)
LDL Chol Calc (NIH): 84 mg/dL (ref 0–99)
Triglycerides: 197 mg/dL — ABNORMAL HIGH (ref 0–149)
VLDL Cholesterol Cal: 33 mg/dL (ref 5–40)

## 2019-11-09 ENCOUNTER — Other Ambulatory Visit: Payer: Self-pay | Admitting: Nurse Practitioner

## 2019-11-09 DIAGNOSIS — R42 Dizziness and giddiness: Secondary | ICD-10-CM

## 2019-11-11 ENCOUNTER — Other Ambulatory Visit: Payer: Self-pay | Admitting: Nurse Practitioner

## 2019-12-17 ENCOUNTER — Other Ambulatory Visit: Payer: Self-pay | Admitting: Nurse Practitioner

## 2019-12-17 DIAGNOSIS — R42 Dizziness and giddiness: Secondary | ICD-10-CM

## 2019-12-20 ENCOUNTER — Other Ambulatory Visit: Payer: Self-pay | Admitting: Nurse Practitioner

## 2019-12-20 DIAGNOSIS — R42 Dizziness and giddiness: Secondary | ICD-10-CM

## 2020-01-10 ENCOUNTER — Other Ambulatory Visit: Payer: Self-pay | Admitting: Nurse Practitioner

## 2020-01-10 DIAGNOSIS — R42 Dizziness and giddiness: Secondary | ICD-10-CM

## 2020-01-11 ENCOUNTER — Other Ambulatory Visit: Payer: Self-pay | Admitting: *Deleted

## 2020-01-11 DIAGNOSIS — F411 Generalized anxiety disorder: Secondary | ICD-10-CM

## 2020-01-11 DIAGNOSIS — R3911 Hesitancy of micturition: Secondary | ICD-10-CM

## 2020-01-11 DIAGNOSIS — N400 Enlarged prostate without lower urinary tract symptoms: Secondary | ICD-10-CM

## 2020-01-11 DIAGNOSIS — E785 Hyperlipidemia, unspecified: Secondary | ICD-10-CM

## 2020-01-11 MED ORDER — SIMVASTATIN 40 MG PO TABS: 40.0000 mg | ORAL_TABLET | Freq: Every day | ORAL | 1 refills | Status: DC

## 2020-01-11 MED ORDER — TAMSULOSIN HCL 0.4 MG PO CAPS: 0.4000 mg | ORAL_CAPSULE | Freq: Every day | ORAL | 0 refills | Status: DC

## 2020-01-11 MED ORDER — ALPRAZOLAM 1 MG PO TABS: 1.0000 mg | ORAL_TABLET | Freq: Two times a day (BID) | ORAL | 0 refills | Status: DC | PRN

## 2020-01-11 MED ORDER — ESCITALOPRAM OXALATE 10 MG PO TABS: 20.0000 mg | ORAL_TABLET | Freq: Every day | ORAL | 0 refills | Status: DC

## 2020-01-11 MED ORDER — MELOXICAM 7.5 MG PO TABS: 7.5000 mg | ORAL_TABLET | Freq: Every day | ORAL | 0 refills | Status: DC

## 2020-01-25 ENCOUNTER — Other Ambulatory Visit: Payer: Self-pay

## 2020-01-26 ENCOUNTER — Encounter: Payer: Self-pay | Admitting: Nurse Practitioner

## 2020-01-26 ENCOUNTER — Ambulatory Visit (INDEPENDENT_AMBULATORY_CARE_PROVIDER_SITE_OTHER): Payer: Medicare Other | Admitting: Nurse Practitioner

## 2020-01-26 DIAGNOSIS — K219 Gastro-esophageal reflux disease without esophagitis: Secondary | ICD-10-CM | POA: Diagnosis not present

## 2020-01-26 DIAGNOSIS — I10 Essential (primary) hypertension: Secondary | ICD-10-CM | POA: Diagnosis not present

## 2020-01-26 DIAGNOSIS — E785 Hyperlipidemia, unspecified: Secondary | ICD-10-CM

## 2020-01-26 DIAGNOSIS — N400 Enlarged prostate without lower urinary tract symptoms: Secondary | ICD-10-CM

## 2020-01-26 DIAGNOSIS — Z6831 Body mass index (BMI) 31.0-31.9, adult: Secondary | ICD-10-CM

## 2020-01-26 DIAGNOSIS — F411 Generalized anxiety disorder: Secondary | ICD-10-CM

## 2020-01-26 MED ORDER — SIMVASTATIN 40 MG PO TABS: 40.0000 mg | ORAL_TABLET | Freq: Every day | ORAL | 1 refills | Status: DC

## 2020-01-26 MED ORDER — TAMSULOSIN HCL 0.4 MG PO CAPS: 0.4000 mg | ORAL_CAPSULE | Freq: Every day | ORAL | 1 refills | Status: DC

## 2020-01-26 MED ORDER — ALPRAZOLAM 1 MG PO TABS: 1.0000 mg | ORAL_TABLET | Freq: Two times a day (BID) | ORAL | 1 refills | Status: DC | PRN

## 2020-01-26 MED ORDER — FUROSEMIDE 20 MG PO TABS: 20.0000 mg | ORAL_TABLET | Freq: Every day | ORAL | 1 refills | Status: DC

## 2020-01-26 MED ORDER — ESCITALOPRAM OXALATE 10 MG PO TABS: 20.0000 mg | ORAL_TABLET | Freq: Every day | ORAL | 1 refills | Status: DC

## 2020-01-26 NOTE — Progress Notes (Signed)
Virtual Visit via telephone Note Due to COVID-19 pandemic this visit was conducted virtually. This visit type was conducted due to national recommendations for restrictions regarding the COVID-19 Pandemic (e.g. social distancing, sheltering in place) in an effort to limit this patient's exposure and mitigate transmission in our community. All issues noted in this document were discussed and addressed.  A physical exam was not performed with this format.  I connected with Francisco [name] on 01/26/20 at 11:40 by telephone and verified that I am speaking with the correct person using two identifiers. Francisco [name] is currently located at home and no one is currently with  him during visit. The provider, [name], FNP is located in their office at time of visit.  I discussed the limitations, risks, security and privacy concerns of performing an evaluation and management service by telephone and the availability of in person appointments. I also discussed with the patient that there may be a patient responsible charge related to this service. The patient expressed understanding and agreed to proceed.   History and Present Illness:   Chief Complaint: Medical Management of Chronic Issues    HPI:  1. Essential hypertension No c/o chest pain, sob or headache. Does not check blood pressure at home.  BP Readings from Last 3 Encounters:  10/31/19 122/73  08/01/19 111/64  01/20/19 132/74     2. Hyperlipidemia, unspecified hyperlipidemia type Doe snot really watch diet and does very little exercise. Lab Results  Component Value Date   CHOL 159 10/31/2019   HDL 42 10/31/2019   LDLCALC 84 10/31/2019   TRIG 197 (H) 10/31/2019   CHOLHDL 3.8 10/31/2019     3. Gastroesophageal reflux disease without esophagitis Only takes OTC meds as needed. Only uses 1-2 x a week  4. Benign prostatic hyperplasia, unspecified whether lower urinary tract symptoms present Is still on flomax. Is  voiding without problems other then voiding often. Especially at night.. Has not seen urology lately  5. GAD (generalized anxiety disorder) Is on xanax daily and is doing ok. Has been anxious since wife died. GAD 7 : Generalized Anxiety Score 01/26/2020 10/31/2019 08/01/2019 09/05/2018  Nervous, Anxious, on Edge 1 1 0 1  Control/stop worrying 1 1 0 2  Worry too much - different things 1 1 0 0  Trouble relaxing 0 1 0 0  Restless 1 0 0 0  Easily annoyed or irritable 0 0 0 0  Afraid - awful might happen 1 0 0 0  Total GAD 7 Score 5 4 0 3  Anxiety Difficulty Somewhat difficult Somewhat difficult - Not difficult at all     6. BMI 31.0-31.9,adult No weight changes Wt Readings from Last 3 Encounters:  10/31/19 206 lb (93.4 kg)  08/01/19 203 lb (92.1 kg)  01/20/19 202 lb (91.6 kg)   BMI Readings from Last 3 Encounters:  10/31/19 30.42 kg/m  08/01/19 29.98 kg/m  01/20/19 29.83 kg/m       Outpatient Encounter Medications as of 01/26/2020  Medication Sig  . ALPRAZolam (XANAX) 1 MG tablet Take 1 tablet (1 mg total) by mouth 2 (two) times daily as needed.  Marland Kitchen aspirin 81 MG tablet Take 81 mg by mouth daily.  . brimonidine (ALPHAGAN) 0.2 % ophthalmic solution Place 1 drop into the left eye 2 (two) times daily.  Marland Kitchen escitalopram (LEXAPRO) 10 MG tablet Take 2 tablets (20 mg total) by mouth daily.  . fish oil-omega-3 fatty acids 1000 MG capsule Take 2 g by mouth  daily.  . furosemide (LASIX) 20 MG tablet Take 1 tablet (20 mg total) by mouth daily.  . meclizine (ANTIVERT) 25 MG tablet TAKE 1 TABLET BY MOUTH  THREE TIMES DAILY AS NEEDED FOR DIZZINESS  . meloxicam (MOBIC) 7.5 MG tablet Take 1 tablet (7.5 mg total) by mouth daily.  . simvastatin (ZOCOR) 40 MG tablet Take 1 tablet (40 mg total) by mouth at bedtime.  . tamsulosin (FLOMAX) 0.4 MG CAPS capsule Take 1 capsule (0.4 mg total) by mouth daily.     Past Surgical History:  Procedure Laterality Date  . AMPUTATION Left 08/05/2016    Procedure: REVISION AMPUTATION LEFT INDEX FINGER REPAIR LACERATION LEFT THUMB;  Surgeon: [name], MD;  Location: [place];  Service: Orthopedics;  Laterality: Left;  . SPINE SURGERY      Family History  Problem Relation Age of Onset  . Healthy Mother   . Transient ischemic attack Father   . Rheum arthritis Daughter   . Amblyopia Neg Hx   . Blindness Neg Hx   . Cataracts Neg Hx   . Glaucoma Neg Hx   . Macular degeneration Neg Hx   . Retinal detachment Neg Hx   . Strabismus Neg Hx   . Retinitis pigmentosa Neg Hx     New complaints: None today  Social history: Lives by hisself. Daughters check on him frequently  Controlled substance contract: 08/02/19    Review of Systems  Constitutional: Negative for diaphoresis and weight loss.  Eyes: Negative for blurred vision, double vision and pain.  Respiratory: Negative for shortness of breath.   Cardiovascular: Negative for chest pain, palpitations, orthopnea and leg swelling.  Gastrointestinal: Negative for abdominal pain.  Skin: Negative for rash.  Neurological: Negative for dizziness, sensory change, loss of consciousness, weakness and headaches.  Endo/Heme/Allergies: Negative for polydipsia. Does not bruise/bleed easily.  Psychiatric/Behavioral: Negative for memory loss. The patient does not have insomnia.   All other systems reviewed and are negative.    Observations/Objective: Alert and oriented- answers all questions appropriately No distress Slight cough during visit- no SOB   Assessment and Plan: Francisco [name] comes in today with chief complaint of Medical Management of Chronic Issues   Diagnosis and orders addressed:  1. Essential hypertension Low sodium diet - furosemide (LASIX) 20 MG tablet; Take 1 tablet (20 mg total) by mouth daily.  Dispense: 90 tablet; Refill: 1  2. Hyperlipidemia, unspecified hyperlipidemia type Low fat diet - simvastatin (ZOCOR) 40 MG tablet; Take 1 tablet (40 mg total) by mouth  at bedtime.  Dispense: 90 tablet; Refill: 1  3. Gastroesophageal reflux disease without esophagitis Avoid spicy foods Do not eat 2 hours prior to bedtime  4. Benign prostatic hyperplasia, unspecified whether lower urinary tract symptoms present Needs to follow up with urology - tamsulosin (FLOMAX) 0.4 MG CAPS capsule; Take 1 capsule (0.4 mg total) by mouth daily.  Dispense: 90 capsule; Refill: 1 - tamsulosin (FLOMAX) 0.4 MG CAPS capsule; Take 1 capsule (0.4 mg total) by mouth daily.  Dispense: 90 capsule; Refill: 1  5. GAD (generalized anxiety disorder) Stress management - escitalopram (LEXAPRO) 10 MG tablet; Take 2 tablets (20 mg total) by mouth daily.  Dispense: 180 tablet; Refill: 1 - ALPRAZolam (XANAX) 1 MG tablet; Take 1 tablet (1 mg total) by mouth 2 (two) times daily as needed.  Dispense: 180 tablet; Refill: 1  6. BMI 31.0-31.9,adult Discussed diet and exercise for person with BMI >25 Will recheck weight in 3-6 months  Labs pending Health Maintenance reviewed Diet and exercise encouraged  Follow up plan: 6 months     I discussed the assessment and treatment plan with the patient. The patient was provided an opportunity to ask questions and all were answered. The patient agreed with the plan and demonstrated an understanding of the instructions.   The patient was advised to call back or seek an in-person evaluation if the symptoms worsen or if the condition fails to improve as anticipated.  The above assessment and management plan was discussed with the patient. The patient verbalized understanding of and has agreed to the management plan. Patient is aware to call the clinic if symptoms persist or worsen. Patient is aware when to return to the clinic for a follow-up visit. Patient educated on when it is appropriate to go to the emergency department.   Time call ended:  11:55  I provided 15 minutes of non-face-to-face time during this encounter.    Mary-Margaret  Hassell Done, FNP

## 2020-02-14 ENCOUNTER — Telehealth: Payer: Self-pay | Admitting: Nurse Practitioner

## 2020-02-14 NOTE — Chronic Care Management (AMB) (Signed)
  Chronic Care Management   Note  02/14/2020 Name: DANDY LAZARO MRN: [id number] DOB: [birth year]  [name] is a 79 y.o. year old male who is a primary care patient of [name], FNP. I reached out to [name] by phone today in response to a referral sent by Mr. Srihan Brutus Doan's health plan.     Mr. Devaul was given information about Chronic Care Management services today including:  1. CCM service includes personalized support from designated clinical staff supervised by his physician, including individualized plan of care and coordination with other care providers 2. 24/7 contact phone numbers for assistance for urgent and routine care needs. 3. Service will only be billed when office clinical staff spend 20 minutes or more in a month to coordinate care. 4. Only one practitioner may furnish and bill the service in a calendar month. 5. The patient may stop CCM services at any time (effective at the end of the month) by phone call to the office staff. 6. The patient will be responsible for cost sharing (co-pay) of up to 20% of the service fee (after annual deductible is met).  Patient agreed to services and verbal consent obtained.   Follow up plan: Telephone appointment with care management team member scheduled for:10/07/2020.  [place], [place] [address] Direct Dial: [phone] [email or web address]: [email or web address]

## 2020-03-09 ENCOUNTER — Other Ambulatory Visit: Payer: Self-pay | Admitting: Nurse Practitioner

## 2020-03-09 DIAGNOSIS — J45909 Unspecified asthma, uncomplicated: Secondary | ICD-10-CM

## 2020-03-09 DIAGNOSIS — R42 Dizziness and giddiness: Secondary | ICD-10-CM

## 2020-03-13 ENCOUNTER — Other Ambulatory Visit: Payer: Self-pay | Admitting: Nurse Practitioner

## 2020-03-28 ENCOUNTER — Ambulatory Visit (INDEPENDENT_AMBULATORY_CARE_PROVIDER_SITE_OTHER): Payer: Medicare Other | Admitting: Nurse Practitioner

## 2020-03-28 DIAGNOSIS — J45909 Unspecified asthma, uncomplicated: Secondary | ICD-10-CM

## 2020-03-28 MED ORDER — BUDESONIDE-FORMOTEROL FUMARATE 80-4.5 MCG/ACT IN AERO
2.0000 | INHALATION_SPRAY | Freq: Two times a day (BID) | RESPIRATORY_TRACT | 3 refills | Status: DC
Start: 2020-03-28 — End: 2020-07-29

## 2020-03-28 NOTE — Progress Notes (Signed)
   Virtual Visit via telephone Note Due to COVID-19 pandemic this visit was conducted virtually. This visit type was conducted due to national recommendations for restrictions regarding the COVID-19 Pandemic (e.g. social distancing, sheltering in place) in an effort to limit this patient's exposure and mitigate transmission in our community. All issues noted in this document were discussed and addressed.  A physical exam was not performed with this format.  I connected with Francisco [name] on 03/28/20 at 8:45 by telephone and verified that I am speaking with the correct person using two identifiers. Francisco [name] is currently located at home and no one is currently with him during visit. The provider, [name], FNP is located in their office at time of visit.  I discussed the limitations, risks, security and privacy concerns of performing an evaluation and management service by telephone and the availability of in person appointments. I also discussed with the patient that there may be a patient responsible charge related to this service. The patient expressed understanding and agreed to proceed.   History and Present Illness:   Chief Complaint: Cough   HPI Patient call in c/o cough for over 2 weeks. The cough has gotten worse. Has history of asthma and use to be on symbicort which helped.    Review of Systems  Constitutional: Negative.   HENT: Positive for congestion.   Respiratory: Positive for cough (productive at times).   Cardiovascular: Negative.   Genitourinary: Negative.   Neurological: Negative for headaches.  Psychiatric/Behavioral: Negative.   All other systems reviewed and are negative.    Observations/Objective: Alert and oriented- answers all questions appropriately No distress sLight cough  Noted during phone visit  Assessment and Plan: Francisco [name] in today with chief complaint of Cough   1. Chronic asthma without complication, unspecified asthma  severity, unspecified whether persistent Meds ordered this encounter  Medications  . budesonide-formoterol (SYMBICORT) 80-4.5 MCG/ACT inhaler    Sig: Inhale 2 puffs into the lungs 2 (two) times daily.    Dispense:  1 Inhaler    Refill:  3    Order Specific Question:   Supervising Provider    Answer:   Worthy Rancher [id number] ay symbicort caused rib pain in the past but he doe not recall that. We will try again Force fluids    Follow Up Instructions: Chronic follow up in 2 weeks    I discussed the assessment and treatment plan with the patient. The patient was provided an opportunity to ask questions and all were answered. The patient agreed with the plan and demonstrated an understanding of the instructions.   The patient was advised to call back or seek an in-person evaluation if the symptoms worsen or if the condition fails to improve as anticipated.  The above assessment and management plan was discussed with the patient. The patient verbalized understanding of and has agreed to the management plan. Patient is aware to call the clinic if symptoms persist or worsen. Patient is aware when to return to the clinic for a follow-up visit. Patient educated on when it is appropriate to go to the emergency department.   Time call ended:  8:58 I provided 13 minutes of non-face-to-face time during this encounter.    [name], FNP

## 2020-04-08 ENCOUNTER — Ambulatory Visit (INDEPENDENT_AMBULATORY_CARE_PROVIDER_SITE_OTHER): Payer: Medicare Other

## 2020-04-08 ENCOUNTER — Encounter: Payer: Self-pay | Admitting: Nurse Practitioner

## 2020-04-08 ENCOUNTER — Other Ambulatory Visit: Payer: Self-pay

## 2020-04-08 ENCOUNTER — Ambulatory Visit (INDEPENDENT_AMBULATORY_CARE_PROVIDER_SITE_OTHER): Payer: Medicare Other | Admitting: Nurse Practitioner

## 2020-04-08 VITALS — BP 113/78 | HR 67 | Temp 97.5°F | Resp 20 | Ht 69.0 in | Wt 206.0 lb

## 2020-04-08 DIAGNOSIS — K219 Gastro-esophageal reflux disease without esophagitis: Secondary | ICD-10-CM

## 2020-04-08 DIAGNOSIS — I10 Essential (primary) hypertension: Secondary | ICD-10-CM | POA: Diagnosis not present

## 2020-04-08 DIAGNOSIS — E785 Hyperlipidemia, unspecified: Secondary | ICD-10-CM

## 2020-04-08 DIAGNOSIS — N400 Enlarged prostate without lower urinary tract symptoms: Secondary | ICD-10-CM

## 2020-04-08 DIAGNOSIS — F411 Generalized anxiety disorder: Secondary | ICD-10-CM

## 2020-04-08 DIAGNOSIS — R490 Dysphonia: Secondary | ICD-10-CM

## 2020-04-08 DIAGNOSIS — J45909 Unspecified asthma, uncomplicated: Secondary | ICD-10-CM

## 2020-04-08 DIAGNOSIS — J984 Other disorders of lung: Secondary | ICD-10-CM | POA: Diagnosis not present

## 2020-04-08 DIAGNOSIS — Z6831 Body mass index (BMI) 31.0-31.9, adult: Secondary | ICD-10-CM

## 2020-04-08 LAB — URINALYSIS, COMPLETE
Bilirubin, UA: NEGATIVE
Glucose, UA: NEGATIVE
Leukocytes,UA: NEGATIVE
Nitrite, UA: NEGATIVE
RBC, UA: NEGATIVE
Specific Gravity, UA: 1.025 (ref 1.005–1.030)
Urobilinogen, Ur: 1 mg/dL (ref 0.2–1.0)
pH, UA: 5.5 (ref 5.0–7.5)

## 2020-04-08 LAB — MICROSCOPIC EXAMINATION
Bacteria, UA: NONE SEEN
Epithelial Cells (non renal): NONE SEEN /hpf (ref 0–10)
RBC, Urine: NONE SEEN /hpf (ref 0–2)
Renal Epithel, UA: NONE SEEN /hpf

## 2020-04-08 NOTE — Progress Notes (Signed)
Subjective:    Patient ID: Francisco [name], male    DOB: Jul 07, 1941, 79 y.o.   MRN: [id number]   Chief Complaint: Medical Management of Chronic Issues    HPI:  1. Essential hypertension No c/o chest pain, sob or headache. Does not check blood pressure at home. BP Readings from Last 3 Encounters:  04/08/20 113/78  10/31/19 122/73  08/01/19 111/64     2. Hyperlipidemia, unspecified hyperlipidemia type Does not watch diet and does very little exercise Lab Results  Component Value Date   CHOL 159 10/31/2019   HDL 42 10/31/2019   LDLCALC 84 10/31/2019   TRIG 197 (H) 10/31/2019   CHOLHDL 3.8 10/31/2019       3. Gastroesophageal reflux disease without esophagitis Is on OTC meds a needed. He ha constannt hoarseness  4. Benign prostatic hyperplasia, unspecified whether lower urinary tract symptoms present He had prostate trimmed 30 year s ago. He ay she gets up 3-4 times at night to void. Has trouble occasionally getting stream to start. Is till on flomax  5. GAD (generalized anxiety disorder) stays stressed is on xanax BID. GAD 7 : Generalized Anxiety Score 04/08/2020 01/26/2020 10/31/2019 08/01/2019  Nervous, Anxious, on Edge 0 1 1 0  Control/stop worrying 0 1 1 0  Worry too much - different things 0 1 1 0  Trouble relaxing 0 0 1 0  Restless 0 1 0 0  Easily annoyed or irritable 0 0 0 0  Afraid - awful might happen 0 1 0 0  Total GAD 7 Score 0 5 4 0  Anxiety Difficulty - Somewhat difficult Somewhat difficult -      6. Chronic asthma without complication, unspecified asthma severity, unspecified whether persistent Is on symbicort daily. Breathing is fine but has constant hoarseness  7. BMI 31.0-31.9,adult No recent weight chsnges Wt Readings from Last 3 Encounters:  04/08/20 206 lb (93.4 kg)  10/31/19 206 lb (93.4 kg)  08/01/19 203 lb (92.1 kg)   BP Readings from Last 3 Encounters:  04/08/20 113/78  10/31/19 122/73  08/01/19 111/64       Outpatient  Encounter Medications as of 04/08/2020  Medication Sig  . ALPRAZolam (XANAX) 1 MG tablet Take 1 tablet (1 mg total) by mouth 2 (two) times daily as needed.  Marland Kitchen aspirin 81 MG tablet Take 81 mg by mouth daily.  . brimonidine (ALPHAGAN) 0.2 % ophthalmic solution Place 1 drop into the left eye 2 (two) times daily.  . budesonide-formoterol (SYMBICORT) 80-4.5 MCG/ACT inhaler Inhale 2 puffs into the lungs 2 (two) times daily.  Marland Kitchen escitalopram (LEXAPRO) 10 MG tablet Take 2 tablets (20 mg total) by mouth daily.  . fish oil-omega-3 fatty acids 1000 MG capsule Take 2 g by mouth daily.  . furosemide (LASIX) 20 MG tablet Take 1 tablet (20 mg total) by mouth daily.  . meclizine (ANTIVERT) 25 MG tablet TAKE 1 TABLET BY MOUTH 3  TIMES DAILY AS NEEDED FOR  DIZZINESS  . meloxicam (MOBIC) 7.5 MG tablet TAKE 1 TABLET BY MOUTH  DAILY  . simvastatin (ZOCOR) 40 MG tablet Take 1 tablet (40 mg total) by mouth at bedtime.  . tamsulosin (FLOMAX) 0.4 MG CAPS capsule Take 1 capsule (0.4 mg total) by mouth daily.    Past Surgical History:  Procedure Laterality Date  . AMPUTATION Left 08/05/2016   Procedure: REVISION AMPUTATION LEFT INDEX FINGER REPAIR LACERATION LEFT THUMB;  Surgeon: [name], MD;  Location: [place];  Service: Orthopedics;  Laterality: Left;  .  SPINE SURGERY      Family History  Problem Relation Age of Onset  . Healthy Mother   . Transient ischemic attack Father   . Rheum arthritis Daughter   . Amblyopia Neg Hx   . Blindness Neg Hx   . Cataracts Neg Hx   . Glaucoma Neg Hx   . Macular degeneration Neg Hx   . Retinal detachment Neg Hx   . Strabismus Neg Hx   . Retinitis pigmentosa Neg Hx     New complaints: Ha occaissonal stabbing pain in ;left pot neck. Only lasts a few second and goes away. Happen 1-2 times a month.  Social history: Live by hisself- his daughter checks on him daily  Controlled substance contract: 08/02/19    Review of Systems  Constitutional: Negative for diaphoresis.   Eyes: Negative for pain.  Respiratory: Negative for shortness of breath.   Cardiovascular: Negative for chest pain, palpitations and leg swelling.  Gastrointestinal: Negative for abdominal pain.  Endocrine: Negative for polydipsia.  Skin: Negative for rash.  Neurological: Negative for dizziness, weakness and headaches.  Hematological: Does not bruise/bleed easily.  All other systems reviewed and are negative.      Objective:   Physical Exam Vitals and nursing note reviewed.  Constitutional:      Appearance: Normal appearance. He is well-developed.  HENT:     Head: Normocephalic.     Nose: Nose normal.     Mouth/Throat:     Lips: Pink.     Mouth: Mucous membranes are moist.     Tongue: No lesions. Tongue does not deviate from midline.     Palate: No mass and lesions.     Pharynx: Oropharynx is clear. Uvula midline. No pharyngeal swelling, oropharyngeal exudate, posterior oropharyngeal erythema or uvula swelling.     Tonsils: No tonsillar exudate.     Comments: Voice hoarse Eyes:     Extraocular Movements: Extraocular movements intact.     Conjunctiva/sclera: Conjunctivae normal.     Pupils: Pupils are equal, round, and reactive to light.  Neck:     Thyroid: No thyroid mass or thyromegaly.     Vascular: No carotid bruit or JVD.     Trachea: Phonation normal.  Cardiovascular:     Rate and Rhythm: Normal rate and regular rhythm.  Pulmonary:     Effort: Pulmonary effort is normal. No respiratory distress.     Breath sounds: Normal breath sounds.  Abdominal:     General: Bowel sounds are normal.     Palpations: Abdomen is soft.     Tenderness: There is no abdominal tenderness.     Hernia: A hernia (ventral) is present.  Musculoskeletal:        General: Normal range of motion.     Cervical back: Normal range of motion and neck supple.  Lymphadenopathy:     Cervical: No cervical adenopathy.  Skin:    General: Skin is warm and dry.     Comments: 2cm annular bluish lip  lesion- no change in several year  Neurological:     Mental Status: He is alert and oriented to person, place, and time.  Psychiatric:        Behavior: Behavior normal.        Thought Content: Thought content normal.        Judgment: Judgment normal.     BP 113/78   Pulse 67   Temp (!) 97.5 F (36.4 C) (Temporal)   Resp 20   Ht 5\' 9"  (  1.753 m)   Wt 206 lb (93.4 kg)   SpO2 93%   BMI 30.42 kg/m   EKG- NSR- [name], FNP chest xray- no active cardiopulmonary disease-Preliminary reading by Ronnald Collum, FNP  [facility]  Urine clear      Assessment & Plan:  Francisco [name] comes in today with chief complaint of Medical Management of Chronic Issues   Diagnosis and orders addressed:  1. Essential hypertension Low sodium diet  2. Hyperlipidemia, unspecified hyperlipidemia type Low fat diet - DG Chest 2 View - EKG 12-Lead  3. Gastroesophageal reflux disease without esophagitis Avoid spicy foods Do not eat 2 hours prior to bedtime   4. Benign prostatic hyperplasia, unspecified whether lower urinary tract symptoms present Referral to urology - Urinalysis, Complete - PSA, total and free - Ambulatory referral to Urology  5. GAD (generalized anxiety disorder) Stress management  6. Chronic asthma without complication, unspecified asthma severity, unspecified whether persistent Continue symbicort  7. BMI 31.0-31.9,adult Discussed diet and exercise for person with BMI >25 Will recheck weight in 3-6 months  8. Hoarseness, persistent Referral to ENT - Ambulatory referral to ENT   Labs pending Health Maintenance reviewed Diet and exercise encouraged  Follow up plan: 3 months   [name], FNP

## 2020-04-08 NOTE — Patient Instructions (Signed)
Hoarseness  Hoarseness, also called dysphonia, is any abnormal change in your voice that can make it difficult to speak. Your voice may sound raspy, breathy, or strained. Hoarseness is caused by a problem with your vocal cords (vocal folds). These are two bands of tissue inside your voice box (larynx). When you speak, your vocal cords move back and forth to create sound. The surfaces of your vocal cords need to be smooth for your voice to sound clear. Swelling or lumps on your vocal cords can cause hoarseness. Common causes of vocal cord problems include:  Infection in the nose, throat, and upper air passages (upper respiratory infection).  A long-term cough.  Straining or overusing your voice.  Smoking, or exposure to secondhand smoke.  Allergies.  Medication side effects.  Vocal cord growths.  Vocal cord injuries.  Stomach acids that move up in your throat and irritate your vocal cords (gastroesophageal reflux).  Diseases that affect the nervous system, such as a stroke or Parkinson's disease. Follow these instructions at home: Watch your condition for any changes. To ease discomfort and protect your vocal cords:  Rest your voice.  Do not whisper. Whispering can cause muscle strain.  Do not speak in a loud or harsh voice.  Avoid coughing or clearing your throat.  Do not use any products that contain nicotine or tobacco, such as cigarettes and e-cigarettes. If you need help quitting, ask your health care provider.  Avoid secondhand smoke.  Do not eat foods that give you heartburn, such as spicy or acidic foods like hot peppers and orange juice. Heartburn can make gastroesophageal reflux worse.  Do not drink beverages that contain caffeine (coffee, tea, or soft drinks) or alcohol (beer, wine, or liquor).  Drink enough fluid to keep your urine pale yellow.  Use a humidifier if the air in your home is dry. If recommended by your health care provider, schedule an  appointment with a speech-language specialist. This specialist may give you methods to try that can help you avoid misusing your voice. Contact a health care provider if:  You have hoarseness that lasts longer than 3 weeks.  You almost lose or completely lose your voice for more than 3 days.  You have pain when you swallow or try to talk.  You feel a lump in your neck. Get help right away if:  You have trouble swallowing.  You feel like you are choking when you swallow.  You cough up blood or vomit blood.  You have trouble breathing.  You choke, cannot swallow, or cannot breathe if you lie flat.  You notice swelling or a rash on your body, face, or tongue. Summary  Hoarseness, also called dysphonia, is any abnormal change in your voice that can make it difficult to speak. Your voice may sound raspy, breathy, or strained.  Hoarseness is caused by a problem with your vocal cords (vocal folds).  Do not speak in a loud or harsh voice, use nicotine or tobacco products, or eat foods that give you heartburn.  If recommended by your health care provider, meet with a speech-language specialist. This information is not intended to replace advice given to you by your health care provider. Make sure you discuss any questions you have with your health care provider. Document Revised: 10/15/2017 Document Reviewed: 07/30/2017 Elsevier Patient Education  2020 [employer]vier Inc[employer]  

## 2020-04-09 ENCOUNTER — Other Ambulatory Visit: Payer: Medicare Other

## 2020-04-09 ENCOUNTER — Other Ambulatory Visit: Payer: Self-pay

## 2020-04-10 LAB — PSA, TOTAL AND FREE
PSA, Free Pct: 25.9 %
PSA, Free: 0.57 ng/mL
Prostate Specific Ag, Serum: 2.2 ng/mL (ref 0.0–4.0)

## 2020-04-14 ENCOUNTER — Other Ambulatory Visit: Payer: Self-pay | Admitting: Nurse Practitioner

## 2020-05-10 ENCOUNTER — Other Ambulatory Visit: Payer: Self-pay | Admitting: Nurse Practitioner

## 2020-05-10 NOTE — Telephone Encounter (Signed)
Last OV 03/2020 next appt with MMM 07/2020

## 2020-05-14 ENCOUNTER — Other Ambulatory Visit: Payer: Self-pay | Admitting: Nurse Practitioner

## 2020-05-14 DIAGNOSIS — R42 Dizziness and giddiness: Secondary | ICD-10-CM

## 2020-05-15 NOTE — Telephone Encounter (Signed)
Last OV 03/2020 Last rx'd 03/11/20 #180 no refills

## 2020-05-22 ENCOUNTER — Other Ambulatory Visit: Payer: Self-pay | Admitting: Nurse Practitioner

## 2020-05-22 DIAGNOSIS — F411 Generalized anxiety disorder: Secondary | ICD-10-CM

## 2020-05-23 NOTE — Telephone Encounter (Signed)
Xanax denied  ntbs for refills on controlled substance

## 2020-05-24 NOTE — Telephone Encounter (Signed)
Not due his time. Will fill at up coming appt

## 2020-05-26 ENCOUNTER — Other Ambulatory Visit: Payer: Self-pay | Admitting: Nurse Practitioner

## 2020-05-26 DIAGNOSIS — F411 Generalized anxiety disorder: Secondary | ICD-10-CM

## 2020-05-27 NOTE — Telephone Encounter (Signed)
Patient was seen by [facility] 04/08/20 and states she was supposed to refill his xanax.  Please advise

## 2020-05-28 ENCOUNTER — Telehealth: Payer: Self-pay | Admitting: Nurse Practitioner

## 2020-05-28 DIAGNOSIS — F411 Generalized anxiety disorder: Secondary | ICD-10-CM

## 2020-05-28 MED ORDER — ALPRAZOLAM 1 MG PO TABS: 1.0000 mg | ORAL_TABLET | Freq: Two times a day (BID) | ORAL | 0 refills | Status: DC | PRN

## 2020-05-28 NOTE — Telephone Encounter (Signed)
[facility]anax [facility] filled on 01/24/20 for 90 day supply with mail order and was given 1 refill. He should have had refilled in June and should last until September.

## 2020-05-28 NOTE — Telephone Encounter (Signed)
Xanax sent to [facility]

## 2020-05-28 NOTE — Telephone Encounter (Signed)
Advised pt to call [facility] about getting refill since this is where we sent the rx. He said he had gotten it in the mail 3 months ago but usually they just send out his medications when they are due. He will contact them to get his refill.

## 2020-05-29 NOTE — Telephone Encounter (Signed)
Patient aware and verbalized understanding. °

## 2020-06-01 ENCOUNTER — Other Ambulatory Visit: Payer: Self-pay | Admitting: Nurse Practitioner

## 2020-06-01 DIAGNOSIS — R42 Dizziness and giddiness: Secondary | ICD-10-CM

## 2020-06-01 DIAGNOSIS — E785 Hyperlipidemia, unspecified: Secondary | ICD-10-CM

## 2020-06-01 DIAGNOSIS — I10 Essential (primary) hypertension: Secondary | ICD-10-CM

## 2020-06-01 DIAGNOSIS — F411 Generalized anxiety disorder: Secondary | ICD-10-CM

## 2020-06-11 DIAGNOSIS — Z880 Allergy status to penicillin: Secondary | ICD-10-CM | POA: Diagnosis not present

## 2020-06-11 DIAGNOSIS — J383 Other diseases of vocal cords: Secondary | ICD-10-CM | POA: Diagnosis not present

## 2020-06-11 DIAGNOSIS — R490 Dysphonia: Secondary | ICD-10-CM | POA: Diagnosis not present

## 2020-06-11 DIAGNOSIS — Z87891 Personal history of nicotine dependence: Secondary | ICD-10-CM | POA: Diagnosis not present

## 2020-06-11 DIAGNOSIS — R1310 Dysphagia, unspecified: Secondary | ICD-10-CM | POA: Diagnosis not present

## 2020-06-11 DIAGNOSIS — R1314 Dysphagia, pharyngoesophageal phase: Secondary | ICD-10-CM | POA: Diagnosis not present

## 2020-06-11 DIAGNOSIS — J384 Edema of larynx: Secondary | ICD-10-CM | POA: Diagnosis not present

## 2020-07-03 DIAGNOSIS — R82998 Other abnormal findings in urine: Secondary | ICD-10-CM | POA: Diagnosis not present

## 2020-07-03 DIAGNOSIS — N138 Other obstructive and reflux uropathy: Secondary | ICD-10-CM | POA: Diagnosis not present

## 2020-07-04 DIAGNOSIS — R1313 Dysphagia, pharyngeal phase: Secondary | ICD-10-CM | POA: Diagnosis not present

## 2020-07-04 DIAGNOSIS — R633 Feeding difficulties: Secondary | ICD-10-CM | POA: Diagnosis not present

## 2020-07-19 DIAGNOSIS — K224 Dyskinesia of esophagus: Secondary | ICD-10-CM | POA: Diagnosis not present

## 2020-07-19 DIAGNOSIS — K219 Gastro-esophageal reflux disease without esophagitis: Secondary | ICD-10-CM | POA: Diagnosis not present

## 2020-07-29 ENCOUNTER — Ambulatory Visit (INDEPENDENT_AMBULATORY_CARE_PROVIDER_SITE_OTHER): Payer: Medicare Other | Admitting: Nurse Practitioner

## 2020-07-29 ENCOUNTER — Other Ambulatory Visit: Payer: Self-pay

## 2020-07-29 ENCOUNTER — Encounter: Payer: Self-pay | Admitting: Nurse Practitioner

## 2020-07-29 VITALS — BP 127/75 | HR 65 | Temp 98.0°F | Resp 20 | Ht 69.0 in | Wt 211.0 lb

## 2020-07-29 DIAGNOSIS — Z6831 Body mass index (BMI) 31.0-31.9, adult: Secondary | ICD-10-CM

## 2020-07-29 DIAGNOSIS — E785 Hyperlipidemia, unspecified: Secondary | ICD-10-CM | POA: Diagnosis not present

## 2020-07-29 DIAGNOSIS — I10 Essential (primary) hypertension: Secondary | ICD-10-CM

## 2020-07-29 DIAGNOSIS — R1314 Dysphagia, pharyngoesophageal phase: Secondary | ICD-10-CM

## 2020-07-29 DIAGNOSIS — N400 Enlarged prostate without lower urinary tract symptoms: Secondary | ICD-10-CM

## 2020-07-29 DIAGNOSIS — F411 Generalized anxiety disorder: Secondary | ICD-10-CM | POA: Diagnosis not present

## 2020-07-29 DIAGNOSIS — Z1159 Encounter for screening for other viral diseases: Secondary | ICD-10-CM

## 2020-07-29 DIAGNOSIS — K219 Gastro-esophageal reflux disease without esophagitis: Secondary | ICD-10-CM | POA: Diagnosis not present

## 2020-07-29 MED ORDER — ESCITALOPRAM OXALATE 10 MG PO TABS: 20.0000 mg | ORAL_TABLET | Freq: Every day | ORAL | 1 refills | Status: DC

## 2020-07-29 MED ORDER — FUROSEMIDE 20 MG PO TABS: 20.0000 mg | ORAL_TABLET | Freq: Every day | ORAL | 1 refills | Status: DC

## 2020-07-29 MED ORDER — SIMVASTATIN 40 MG PO TABS: 40.0000 mg | ORAL_TABLET | Freq: Every day | ORAL | 1 refills | Status: DC

## 2020-07-29 MED ORDER — TAMSULOSIN HCL 0.4 MG PO CAPS: 0.4000 mg | ORAL_CAPSULE | Freq: Every day | ORAL | 1 refills | Status: DC

## 2020-07-29 MED ORDER — ALPRAZOLAM 1 MG PO TABS: 1.0000 mg | ORAL_TABLET | Freq: Two times a day (BID) | ORAL | 3 refills | Status: DC | PRN

## 2020-07-29 NOTE — Progress Notes (Signed)
Subjective:    Patient ID: Francisco [name], male    DOB: [birth year], 79 y.o.   MRN: [id number]   Chief Complaint: No chief complaint on file.    HPI:  1. Essential hypertension BP Readings from Last 3 Encounters:  07/29/20 127/75  04/08/20 113/78  10/31/19 122/73  Checks BP at home irregularly. Takes medication as prescribed. Denies chest pain, SOB, or swelling.   2. Gastroesophageal reflux disease without esophagitis States no complaints with reflux. Follows ENT for swallowing issues.   3. GAD (generalized anxiety disorder) GAD 7 : Generalized Anxiety Score 04/08/2020 01/26/2020 10/31/2019 08/01/2019  Nervous, Anxious, on Edge 0 1 1 0  Control/stop worrying 0 1 1 0  Worry too much - different things 0 1 1 0  Trouble relaxing 0 0 1 0  Restless 0 1 0 0  Easily annoyed or irritable 0 0 0 0  Afraid - awful might happen 0 1 0 0  Total GAD 7 Score 0 5 4 0  Anxiety Difficulty - Somewhat difficult Somewhat difficult -  Takes Xanax twice a day and Lexapro daily. Denies any new or worsening symptoms.    4. Hyperlipidemia, unspecified hyperlipidemia type Lab Results  Component Value Date   CHOL 159 10/31/2019   HDL 42 10/31/2019   LDLCALC 84 10/31/2019   TRIG 197 (H) 10/31/2019   CHOLHDL 3.8 10/31/2019  Takes medication as prescribed. Does not watch intake or fried or fast foods.    5. BMI 31.0-31.9,adult Wt Readings from Last 3 Encounters:  07/29/20 211 lb (95.7 kg)  04/08/20 206 lb (93.4 kg)  10/31/19 206 lb (93.4 kg)   BMI Readings from Last 3 Encounters:  07/29/20 31.16 kg/m  04/08/20 30.42 kg/m  10/31/19 30.42 kg/m   Denies any formal exercise, walks some. Does yard work.      Outpatient Encounter Medications as of 07/29/2020  Medication Sig  . ALPRAZolam (XANAX) 1 MG tablet Take 1 tablet (1 mg total) by mouth 2 (two) times daily as needed.  Marland Kitchen aspirin 81 MG tablet Take 81 mg by mouth daily.  . brimonidine (ALPHAGAN) 0.2 % ophthalmic solution Place 1 drop  into the left eye 2 (two) times daily.  . budesonide-formoterol (SYMBICORT) 80-4.5 MCG/ACT inhaler Inhale 2 puffs into the lungs 2 (two) times daily.  Marland Kitchen escitalopram (LEXAPRO) 10 MG tablet TAKE 2 TABLETS BY MOUTH  DAILY  . fish oil-omega-3 fatty acids 1000 MG capsule Take 2 g by mouth daily.  . furosemide (LASIX) 20 MG tablet TAKE 1 TABLET BY MOUTH  DAILY  . meclizine (ANTIVERT) 25 MG tablet TAKE 1 TABLET BY MOUTH 3  TIMES DAILY AS NEEDED FOR  DIZZINESS  . meloxicam (MOBIC) 7.5 MG tablet Take 1 tablet by mouth once daily  . simvastatin (ZOCOR) 40 MG tablet TAKE 1 TABLET BY MOUTH AT  BEDTIME  . tamsulosin (FLOMAX) 0.4 MG CAPS capsule Take 1 capsule (0.4 mg total) by mouth daily.   No facility-administered encounter medications on file as of 07/29/2020.    Past Surgical History:  Procedure Laterality Date  . AMPUTATION Left 08/05/2016   Procedure: REVISION AMPUTATION LEFT INDEX FINGER REPAIR LACERATION LEFT THUMB;  Surgeon: [name], MD;  Location: [place];  Service: Orthopedics;  Laterality: Left;  . SPINE SURGERY      Family History  Problem Relation Age of Onset  . Healthy Mother   . Transient ischemic attack Father   . Rheum arthritis Daughter   . Amblyopia Neg Hx   .  Blindness Neg Hx   . Cataracts Neg Hx   . Glaucoma Neg Hx   . Macular degeneration Neg Hx   . Retinal detachment Neg Hx   . Strabismus Neg Hx   . Retinitis pigmentosa Neg Hx     New complaints: No new complaints today.   Social history: Lives by hisself- daughters  check on him daily  Controlled substance contract: signed 07/29/2020    Review of Systems  Constitutional: Negative.   HENT: Negative.   Eyes: Negative.   Respiratory: Negative.   Cardiovascular: Negative.   Gastrointestinal: Negative.   Endocrine: Negative.   Genitourinary: Negative.   Musculoskeletal: Negative.   Skin: Negative.   Allergic/Immunologic: Negative.   Neurological: Negative.   Hematological: Negative.     Psychiatric/Behavioral: Negative.   All other systems reviewed and are negative.      Objective:   Physical Exam Vitals and nursing note reviewed.  Constitutional:      Appearance: Normal appearance.  HENT:     Head: Normocephalic and atraumatic.     Right Ear: Tympanic membrane, ear canal and external ear normal.     Left Ear: Tympanic membrane, ear canal and external ear normal.     Nose: Nose normal.     Mouth/Throat:     Mouth: Mucous membranes are moist.     Pharynx: Oropharynx is clear.  Eyes:     Extraocular Movements: Extraocular movements intact.     Conjunctiva/sclera: Conjunctivae normal.     Pupils: Pupils are equal, round, and reactive to light.  Cardiovascular:     Rate and Rhythm: Normal rate and regular rhythm.     Pulses: Normal pulses.     Heart sounds: Normal heart sounds.  Pulmonary:     Effort: Pulmonary effort is normal.     Breath sounds: Normal breath sounds.  Abdominal:     General: Bowel sounds are normal.     Palpations: Abdomen is soft.     Hernia: A hernia is present. Hernia is present in the umbilical area.    Musculoskeletal:        General: Normal range of motion.     Right hand: Normal.     Cervical back: Normal range of motion.     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  Skin:    General: Skin is warm and dry.     Capillary Refill: Capillary refill takes less than 2 seconds.  Neurological:     General: No focal deficit present.     Mental Status: He is alert and oriented to person, place, and time. Mental status is at baseline.  Psychiatric:        Mood and Affect: Mood normal.        Behavior: Behavior normal.        Thought Content: Thought content normal.        Judgment: Judgment normal.    BP 127/75   Pulse 65   Temp 98 F (36.7 C) (Temporal)   Resp 20   Ht 5\' 9"  (1.753 m)   Wt 211 lb (95.7 kg)   SpO2 97%   BMI 31.16 kg/m       Assessment & Plan:  Francisco [name] comes in today with  chief complaint of No chief complaint on file.   Diagnosis and orders addressed:  1. Essential hypertension Check blood pressure regularly and take medication as prescribed. Eat a heart healthy diet and avoid high salt foods.  2. Gastroesophageal reflux disease without esophagitis Take medication as prescribed. Avoid trigger foods such as spicy foods. Eat smaller meals more often verses large meals.   3. GAD (generalized anxiety disorder) Practice stress management and take medication as prescribed. Report any new or worsening symptoms.   4. Hyperlipidemia, unspecified hyperlipidemia type Avoid foods that are fried or hight in fat. Take medication as prescribed.   5. BMI 31.0-31.9,adult Stay active. Walking is a great cardiovascular exercise that helps lower blood pressure, cholesterol levels, and lowers weight.   Meds ordered this encounter  Medications  . tamsulosin (FLOMAX) 0.4 MG CAPS capsule    Sig: Take 1 capsule (0.4 mg total) by mouth daily.    Dispense:  90 capsule    Refill:  1    Order Specific Question:   Supervising Provider    Answer:   Caryl Pina A [id number]  . furosemide (LASIX) 20 MG tablet    Sig: Take 1 tablet (20 mg total) by mouth daily.    Dispense:  90 tablet    Refill:  1    Order Specific Question:   Supervising Provider    Answer:   Caryl Pina A [id number]  . escitalopram (LEXAPRO) 10 MG tablet    Sig: Take 2 tablets (20 mg total) by mouth daily.    Dispense:  180 tablet    Refill:  1    Order Specific Question:   Supervising Provider    Answer:   Caryl Pina A [id number]  . ALPRAZolam (XANAX) 1 MG tablet    Sig: Take 1 tablet (1 mg total) by mouth 2 (two) times daily as needed.    Dispense:  60 tablet    Refill:  3    Order Specific Question:   Supervising Provider    Answer:   Caryl Pina A [id number]  . simvastatin (ZOCOR) 40 MG tablet    Sig: Take 1 tablet (40 mg total) by mouth at bedtime.    Dispense:  90 tablet      Refill:  1    Order Specific Question:   Supervising Provider    Answer:   Caryl Pina A [[id number]]   6 dysphagia Changed referral to  from [employer] pending Health Maintenance reviewed Diet and exercise encouraged  Follow up plan: Follow up in 3 months.    [name], FNP

## 2020-07-29 NOTE — Patient Instructions (Signed)
Dysphagia Eating Plan, Bite Size Food This diet plan is for people with moderate swallowing problems who have transitioned from pureed and minced foods. Bite size foods are soft and cut into small chunks so that they can be swallowed safely. On this eating plan, you may be instructed to drink liquids that are thickened. Work with your health care provider and your diet and nutrition specialist (dietitian) to make sure that you are following the diet safely and getting all the nutrients you need. What are tips for following this plan? General guidelines for foods   You may eat foods that are tender, soft, and moist.  Always test food texture before taking a bite. Poke food with a fork or spoon to make sure it is tender.  Food should be easy to cut and shew. Avoid large pieces of food that require a lot of chewing.  Take small bites. Each bite should be smaller than your thumb nail (about 15mm by 15 mm).  If you were on pureed and minced food diet plans, you may eat any of the foods included in those diets.  Avoid foods that are very dry, hard, sticky, chewy, coarse, or crunchy.  If instructed by your health care provider, thicken liquids. Follow your health care provider's instructions for what products to use, how to do this, and to what thickness. ? Your health care provider may recommend using a commercial thickener, rice cereal, or potato flakes. Ask your health care provider to recommend thickeners. ? Thickened liquids are usually a "pudding-like" consistency, or they may be as thick as honey or thick enough to eat with a spoon. Cooking  To moisten foods, you may add liquids while you are blending, mashing, or grinding your foods to the right consistency. These liquids include gravies, sauces, vegetable or fruit juice, milk, half and half, or water.  Strain extra liquid from foods before eating.  Reheat foods slowly to prevent a tough crust from forming.  Prepare foods in advance.  Meal planning  Eat a variety of foods to get all the nutrients you need.  Some foods may be tolerated better than others. Work with your dietitian to identify which foods are safest for you to eat.  Follow your meal plan as told by your dietitian. What foods are allowed? Grains Moist breads without nuts or seeds. Biscuits, muffins, pancakes, and waffles that are well-moistened with syrup, jelly, margarine, or butter. Cooked cereals. Moist bread stuffing. Moist rice. Well-moistened cold cereal with small chunks. Well-cooked pasta, noodles, rice, and bread dressing in small pieces and thick sauce. Soft dumplings or spaetzle in small pieces and butter or gravy. Vegetables Soft, well-cooked vegetables in small pieces. Soft-cooked, mashed potatoes. Thickened vegetable juice. Fruits Canned or cooked fruits that are soft or moist and do not have skin or seeds. Fresh, soft bananas. Thickened fruit juices. Meat and other protein foods Tender, moist meats or poultry in small pieces. Moist meatballs or meatloaf. Fish without bones. Eggs or egg substitutes in small pieces. Tofu. Tempeh and meat alternatives in small pieces. Well-cooked, tender beans, peas, baked beans, and other legumes. Dairy Thickened milk. Cream cheese. Yogurt. Cottage cheese. Sour cream. Small pieces of soft cheese. Fats and oils Butter. Oils. Margarine. Mayonnaise. Gravy. Spreads. Sweets and desserts Soft, smooth, moist desserts. Pudding. Custard. Moist cakes. Jam. Jelly. Honey. Preserves. Ask your health care provider whether you can have frozen desserts. Seasoning and other foods All seasonings and sweeteners. All sauces with small chunks. Prepared tuna, egg, or chicken   salad without raw fruits or vegetables. Moist casseroles with small, tender pieces of meat. Soups with tender meat. What foods are not allowed? Grains Coarse or dry cereals. Dry breads. Toast. Crackers. Tough, crusty breads, such as French bread and baguettes.  Dry pancakes, waffles, and muffins. Sticky rice. Dry bread stuffing. Granola. Popcorn. Chips. Vegetables All raw vegetables. Cooked corn. Rubbery or stiff cooked vegetables. Stringy vegetables, such as celery. Tough, crisp fried potatoes. Potato skins. Fruits Hard, crunchy, stringy, high-pulp, and juicy raw fruits such as apples, pineapple, papaya, and watermelon. Small, round fruits, such as grapes. Dried fruit and fruit leather. Meat and other protein foods Large pieces of meat. Dry, tough meats, such as bacon, sausage, and hot dogs. Chicken, turkey, or fish with skin and bones. Crunchy peanut butter. Nuts. Seeds. Nut and seed butters. Dairy Yogurt with nuts, seeds, or large chunks. Large chunks of cheese. Frozen desserts and milk consistency not allowed by your dietitian. Sweets and desserts Dry cakes. Chewy or dry cookies. Any desserts with nuts, seeds, dry fruits, coconut, pineapple, or anything dry, sticky, or hard. Chewy caramel. Licorice. Taffy-type candies. Ask your health care provider whether you can have frozen desserts. Seasoning and other foods Soups with tough or large chunks of meats, poultry, or vegetables. Corn or clam chowder. Smoothies with large chunks of fruit. Summary  Bite size foods can be helpful for people with moderate swallowing problems.  On the dysphagia eating plan, you may eat foods that are soft, moist, and cut into pieces smaller than 15mm by 15mm.  You may be instructed to thicken liquids. Follow your health care provider's instructions about how to do this and to what consistency. This information is not intended to replace advice given to you by your health care provider. Make sure you discuss any questions you have with your health care provider. Document Revised: 02/23/2019 Document Reviewed: 02/12/2017 Elsevier Patient Education  2020 Elsevier Inc.  

## 2020-08-24 DIAGNOSIS — H40033 Anatomical narrow angle, bilateral: Secondary | ICD-10-CM | POA: Diagnosis not present

## 2020-08-24 DIAGNOSIS — H2513 Age-related nuclear cataract, bilateral: Secondary | ICD-10-CM | POA: Diagnosis not present

## 2020-09-12 ENCOUNTER — Ambulatory Visit (INDEPENDENT_AMBULATORY_CARE_PROVIDER_SITE_OTHER): Payer: Medicare Other

## 2020-09-12 ENCOUNTER — Other Ambulatory Visit: Payer: Self-pay

## 2020-09-12 DIAGNOSIS — Z23 Encounter for immunization: Secondary | ICD-10-CM | POA: Diagnosis not present

## 2020-10-07 ENCOUNTER — Ambulatory Visit: Payer: Medicare Other | Admitting: *Deleted

## 2020-10-07 ENCOUNTER — Telehealth: Payer: Self-pay | Admitting: *Deleted

## 2020-10-07 DIAGNOSIS — I10 Essential (primary) hypertension: Secondary | ICD-10-CM

## 2020-10-07 DIAGNOSIS — E785 Hyperlipidemia, unspecified: Secondary | ICD-10-CM

## 2020-10-07 DIAGNOSIS — N400 Enlarged prostate without lower urinary tract symptoms: Secondary | ICD-10-CM

## 2020-10-07 NOTE — Telephone Encounter (Signed)
10/07/2020  Francisco [name] was referred to urologist in [facility] but had difficulty finding the office and would like to be referred to [place].   Reason for Referral:urinary frequency, BPH  Has the referral been discussed with the patient?: yes  Designated contact for the referral if not the patient (name/phone number): [phone]  Has the patient seen a specialist for this issue before?:yes  If so, who (practice/provider)? unsure  Does the patient have a provider or location preference for the referral?:[place] Would the patient like to see previous specialist if applicable?  Forwarding to PCP for review and referral.   Chong Sicilian, BSN, RN-BC [place] [facility] [place] Management Direct Dial: [phone]

## 2020-10-07 NOTE — Chronic Care Management (AMB) (Signed)
  Chronic Care Management   Initial Visit Note  10/07/2020 Name: Francisco [name] MRN: [id number] DOB: 05/10/1941  Referred by: [name], FNP Reason for referral : Chronic Care Management (Initial Visit)   Francisco [name] is a 79 y.o. year old male who is a primary care patient of [name], [place]. The CCM team was consulted for assistance with chronic disease management and care coordination needs related to HTN, HLD, Anxiety and Pulmonary Disease.  Review of patient status, including review of consultants reports, relevant laboratory and other test results was performed prior to outreach.   I spoke with Francisco [name] by telephone today regarding management of their chronic medical conditions. They do not have any CCM or resource needs and feel that their medical conditions are well managed. They appreciated the outreach, but do not feel that CCM services are needed at this time. They will reach out in the future if a need arises. I advised Francisco [name] that labs were ordered at his office visit on 07/29/20 and still need to be collected. We also discussed his need for a urology referral. He was referred to [facility] but had difficulty finding the office and would like to be referred to [facility]. Telephone message with request sent to PCP.  SDOH (Social Determinants of Health) assessments performed: Yes See Care Plan activities for detailed interventions related to SDOH     Plan:  . The patient has been provided with contact information for the care management team and has been advised to call if they would like to re-enroll in the CCM program to receive care management and care coordination services.  . CCM enrollment status changed to "previously enrolled" as per patient request on 10/07/2020  to discontinue enrollment. Case closed to case management services in primary care home.  . Patient was provided with general disease management education and encouraged to follow-up  with their PCP and specialists as recommended.  . Patient will come in to [facility] for fasting labs that were ordered at visit on 07/29/20 . Patient will reach out to [facility] referral staff if he hasn't heard from urology referral within the next 2 weeks  Chong Sicilian, BSN, RN-BC [place] [facility] [place] Management Direct Dial: [phone]

## 2020-10-07 NOTE — Patient Instructions (Signed)
Plan:  . The patient has been provided with contact information for the care management team and has been advised to call if they would like to re-enroll in the CCM program to receive care management and care coordination services.  . CCM enrollment status changed to "previously enrolled" as per patient request on 10/07/2020  to discontinue enrollment. Case closed to case management services in primary care home.  . Patient was provided with general disease management education and encouraged to follow-up with their PCP and specialists as recommended.  . Patient will come in to [facility] for fasting labs that were ordered at visit on 07/29/20 . Patient will reach out to [facility] referral staff if he hasn't heard from urology referral within the next 2 weeks  Chong Sicilian, BSN, RN-BC [place] [facility] [place] Management Direct Dial: [phone]

## 2020-10-08 ENCOUNTER — Other Ambulatory Visit: Payer: Medicare Other

## 2020-10-08 ENCOUNTER — Other Ambulatory Visit: Payer: Self-pay

## 2020-10-08 DIAGNOSIS — I10 Essential (primary) hypertension: Secondary | ICD-10-CM | POA: Diagnosis not present

## 2020-10-08 DIAGNOSIS — E785 Hyperlipidemia, unspecified: Secondary | ICD-10-CM | POA: Diagnosis not present

## 2020-10-08 DIAGNOSIS — Z1159 Encounter for screening for other viral diseases: Secondary | ICD-10-CM | POA: Diagnosis not present

## 2020-10-09 LAB — BMP8+EGFR
BUN/Creatinine Ratio: 13 (ref 10–24)
BUN: 17 mg/dL (ref 8–27)
CO2: 25 mmol/L (ref 20–29)
Calcium: 9.2 mg/dL (ref 8.6–10.2)
Chloride: 103 mmol/L (ref 96–106)
Creatinine, Ser: 1.36 mg/dL — ABNORMAL HIGH (ref 0.76–1.27)
GFR calc Af Amer: 57 mL/min/{1.73_m2} — ABNORMAL LOW (ref >59)
GFR calc non Af Amer: 49 mL/min/{1.73_m2} — ABNORMAL LOW (ref >59)
Glucose: 148 mg/dL — ABNORMAL HIGH (ref 65–99)
Potassium: 4.4 mmol/L (ref 3.5–5.2)
Sodium: 142 mmol/L (ref 134–144)

## 2020-10-09 LAB — CBC WITH DIFFERENTIAL/PLATELET
Basophils Absolute: 0 10*3/uL (ref 0.0–0.2)
Basos: 1 %
EOS (ABSOLUTE): 0.2 10*3/uL (ref 0.0–0.4)
Eos: 3 %
Hematocrit: 41.5 % (ref 37.5–51.0)
Hemoglobin: 14.6 g/dL (ref 13.0–17.7)
Immature Grans (Abs): 0 10*3/uL (ref 0.0–0.1)
Immature Granulocytes: 0 %
Lymphocytes Absolute: 1.4 10*3/uL (ref 0.7–3.1)
Lymphs: 24 %
MCH: 34 pg — ABNORMAL HIGH (ref 26.6–33.0)
MCHC: 35.2 g/dL (ref 31.5–35.7)
MCV: 97 fL (ref 79–97)
Monocytes Absolute: 0.4 10*3/uL (ref 0.1–0.9)
Monocytes: 6 %
Neutrophils Absolute: 3.9 10*3/uL (ref 1.4–7.0)
Neutrophils: 66 %
Platelets: 164 10*3/uL (ref 150–450)
RBC: 4.29 x10E6/uL (ref 4.14–5.80)
RDW: 13.3 % (ref 11.6–15.4)
WBC: 6 10*3/uL (ref 3.4–10.8)

## 2020-10-09 LAB — LIPID PANEL
Chol/HDL Ratio: 3.9 ratio (ref 0.0–5.0)
Cholesterol, Total: 154 mg/dL (ref 100–199)
HDL: 40 mg/dL (ref >39)
LDL Chol Calc (NIH): 84 mg/dL (ref 0–99)
Triglycerides: 175 mg/dL — ABNORMAL HIGH (ref 0–149)
VLDL Cholesterol Cal: 30 mg/dL (ref 5–40)

## 2020-10-09 LAB — HEPATITIS C ANTIBODY: Hep C Virus Ab: <0.1 s/co ratio (ref 0.0–0.9)

## 2020-10-15 ENCOUNTER — Telehealth: Payer: Self-pay

## 2020-10-15 NOTE — Telephone Encounter (Signed)
LMOVM to Francisco [name], we sent refills in on 07/29/20 to Francisco [name], please call us back if you are wanting [place] to transfer these refills to [facility]

## 2020-10-16 ENCOUNTER — Ambulatory Visit (INDEPENDENT_AMBULATORY_CARE_PROVIDER_SITE_OTHER): Payer: Medicare Other

## 2020-10-16 ENCOUNTER — Other Ambulatory Visit: Payer: Self-pay

## 2020-10-16 DIAGNOSIS — Z23 Encounter for immunization: Secondary | ICD-10-CM

## 2020-10-16 NOTE — Progress Notes (Signed)
   Covid-19 Vaccination Clinic  Name:  DARRELL HAUK    MRN: [id number] DOB: 1941/09/14  10/16/2020  Mr. Chavarin was observed post Covid-19 immunization for 15 minutes without incident. He was provided with Vaccine Information Sheet and instruction to access the V-Safe system.   Mr. Flatt was instructed to call 911 with any severe reactions post vaccine: Marland Kitchen Difficulty breathing  . Swelling of face and throat  . A fast heartbeat  . A bad rash all over body  . Dizziness and weakness   Immunizations Administered    No immunizations on file.

## 2020-10-18 ENCOUNTER — Telehealth: Payer: Self-pay

## 2020-10-18 DIAGNOSIS — E785 Hyperlipidemia, unspecified: Secondary | ICD-10-CM

## 2020-10-18 DIAGNOSIS — I10 Essential (primary) hypertension: Secondary | ICD-10-CM

## 2020-10-18 DIAGNOSIS — N400 Enlarged prostate without lower urinary tract symptoms: Secondary | ICD-10-CM

## 2020-10-18 DIAGNOSIS — F411 Generalized anxiety disorder: Secondary | ICD-10-CM

## 2020-10-18 MED ORDER — TAMSULOSIN HCL 0.4 MG PO CAPS: 0.4000 mg | ORAL_CAPSULE | Freq: Every day | ORAL | 0 refills | Status: DC

## 2020-10-18 MED ORDER — SIMVASTATIN 40 MG PO TABS: 40.0000 mg | ORAL_TABLET | Freq: Every day | ORAL | 0 refills | Status: DC

## 2020-10-18 MED ORDER — ESCITALOPRAM OXALATE 10 MG PO TABS: 20.0000 mg | ORAL_TABLET | Freq: Every day | ORAL | 0 refills | Status: DC

## 2020-10-18 MED ORDER — FUROSEMIDE 20 MG PO TABS: 20.0000 mg | ORAL_TABLET | Freq: Every day | ORAL | 0 refills | Status: DC

## 2020-10-18 NOTE — Telephone Encounter (Signed)
LMOVM medicaitons sent to [facility]

## 2020-10-18 NOTE — Telephone Encounter (Signed)
Completed in another encounter

## 2020-10-21 ENCOUNTER — Other Ambulatory Visit: Payer: Self-pay

## 2020-10-21 DIAGNOSIS — R42 Dizziness and giddiness: Secondary | ICD-10-CM

## 2020-10-21 MED ORDER — MECLIZINE HCL 25 MG PO TABS: ORAL_TABLET | ORAL | 0 refills | Status: DC

## 2020-10-23 ENCOUNTER — Other Ambulatory Visit: Payer: Self-pay | Admitting: *Deleted

## 2020-10-23 DIAGNOSIS — F411 Generalized anxiety disorder: Secondary | ICD-10-CM

## 2020-11-05 ENCOUNTER — Encounter: Payer: Self-pay | Admitting: Nurse Practitioner

## 2020-11-05 ENCOUNTER — Other Ambulatory Visit: Payer: Self-pay

## 2020-11-05 ENCOUNTER — Ambulatory Visit (INDEPENDENT_AMBULATORY_CARE_PROVIDER_SITE_OTHER): Payer: Medicare Other | Admitting: Nurse Practitioner

## 2020-11-05 VITALS — BP 120/77 | HR 67 | Temp 98.4°F | Resp 20 | Ht 69.0 in | Wt 206.0 lb

## 2020-11-05 DIAGNOSIS — E782 Mixed hyperlipidemia: Secondary | ICD-10-CM | POA: Diagnosis not present

## 2020-11-05 DIAGNOSIS — M79602 Pain in left arm: Secondary | ICD-10-CM | POA: Diagnosis not present

## 2020-11-05 DIAGNOSIS — N400 Enlarged prostate without lower urinary tract symptoms: Secondary | ICD-10-CM

## 2020-11-05 DIAGNOSIS — I10 Essential (primary) hypertension: Secondary | ICD-10-CM

## 2020-11-05 DIAGNOSIS — K219 Gastro-esophageal reflux disease without esophagitis: Secondary | ICD-10-CM | POA: Diagnosis not present

## 2020-11-05 DIAGNOSIS — F411 Generalized anxiety disorder: Secondary | ICD-10-CM

## 2020-11-05 DIAGNOSIS — Z6831 Body mass index (BMI) 31.0-31.9, adult: Secondary | ICD-10-CM

## 2020-11-05 MED ORDER — ESCITALOPRAM OXALATE 10 MG PO TABS: 20.0000 mg | ORAL_TABLET | Freq: Every day | ORAL | 1 refills | Status: DC

## 2020-11-05 MED ORDER — SIMVASTATIN 40 MG PO TABS: 40.0000 mg | ORAL_TABLET | Freq: Every day | ORAL | 1 refills | Status: DC

## 2020-11-05 MED ORDER — FUROSEMIDE 20 MG PO TABS: 20.0000 mg | ORAL_TABLET | Freq: Every day | ORAL | 1 refills | Status: DC

## 2020-11-05 MED ORDER — ALPRAZOLAM 1 MG PO TABS: 1.0000 mg | ORAL_TABLET | Freq: Two times a day (BID) | ORAL | 3 refills | Status: DC | PRN

## 2020-11-05 NOTE — Progress Notes (Signed)
 Subjective:    Patient ID: Francisco [name], male    DOB: 09/21/1941, 79 y.o.   MRN: [id number]   Chief Complaint: Medical Management of Chronic Issues    HPI:  1. Primary hypertension No c/o chest pain, sob or headache. Does not check blood pressure at home. BP Readings from Last 3 Encounters:  11/05/20 120/77  07/29/20 127/75  04/08/20 113/78     2. Mixed hyperlipidemia Does try to watch diet but does not do much exercise. Lab Results  Component Value Date   CHOL 154 10/08/2020   HDL 40 10/08/2020   LDLCALC 84 10/08/2020   TRIG 175 (H) 10/08/2020   CHOLHDL 3.9 10/08/2020     3. Gastroesophageal reflux disease without esophagitis Is currently not in anything. Has not had any symptoms as of late.  4. GAD (generalized anxiety disorder) Is on xanax daily. Says he gets very anxious if he does not take xanax.  5. Benign prostatic hyperplasia, unspecified whether lower urinary tract symptoms present He is currently on flomax and has appointment with urology in first of January. He denies any problems voiding Lab Results  Component Value Date   PSA1 2.2 04/09/2020   PSA1 1.3 02/09/2018   PSA 1.2 12/27/2014   PSA 1.0 07/14/2013      6. BMI 31.0-31.9,adult No recent weight changes Wt Readings from Last 3 Encounters:  11/05/20 206 lb (93.4 kg)  07/29/20 211 lb (95.7 kg)  04/08/20 206 lb (93.4 kg)   BMI Readings from Last 3 Encounters:  11/05/20 30.42 kg/m  07/29/20 31.16 kg/m  04/08/20 30.42 kg/m       Outpatient Encounter Medications as of 11/05/2020  Medication Sig  . ALPRAZolam (XANAX) 1 MG tablet Take 1 tablet (1 mg total) by mouth 2 (two) times daily as needed.  . aspirin 81 MG tablet Take 81 mg by mouth daily.  . escitalopram (LEXAPRO) 10 MG tablet Take 2 tablets (20 mg total) by mouth daily.  . fish oil-omega-3 fatty acids 1000 MG capsule Take 2 g by mouth daily.  . furosemide (LASIX) 20 MG tablet Take 1 tablet (20 mg total) by mouth daily.  .  meclizine (ANTIVERT) 25 MG tablet TAKE 1 TABLET BY MOUTH 3  TIMES DAILY AS NEEDED FOR  DIZZINESS  . meloxicam (MOBIC) 7.5 MG tablet Take 1 tablet by mouth once daily  . simvastatin (ZOCOR) 40 MG tablet Take 1 tablet (40 mg total) by mouth at bedtime.  . tamsulosin (FLOMAX) 0.4 MG CAPS capsule Take 1 capsule (0.4 mg total) by mouth daily.   No facility-administered encounter medications on file as of 11/05/2020.    Past Surgical History:  Procedure Laterality Date  . AMPUTATION Left 08/05/2016   Procedure: REVISION AMPUTATION LEFT INDEX FINGER REPAIR LACERATION LEFT THUMB;  Surgeon: Fred Ortmann, MD;  Location: [facility] OR;  Service: Orthopedics;  Laterality: Left;  . SPINE SURGERY      Family History  Problem Relation Age of Onset  . Healthy Mother   . Transient ischemic attack Father   . Rheum arthritis Daughter   . Amblyopia Neg Hx   . Blindness Neg Hx   . Cataracts Neg Hx   . Glaucoma Neg Hx   . Macular degeneration Neg Hx   . Retinal detachment Neg Hx   . Strabismus Neg Hx   . Retinitis pigmentosa Neg Hx     New complaints: C/o left arm pain. Occurs when he is just sitting in his recliner. He can moveit   around and it will ease up. Denies any SOB.  Social history: Lives by hisself  Controlled substance contract: n/a    Review of Systems  Constitutional: Negative for diaphoresis.  Eyes: Negative for pain.  Respiratory: Negative for shortness of breath.   Cardiovascular: Negative for chest pain, palpitations and leg swelling.  Gastrointestinal: Negative for abdominal pain.  Endocrine: Negative for polydipsia.  Skin: Negative for rash.  Neurological: Negative for dizziness, weakness and headaches.  Hematological: Does not bruise/bleed easily.  All other systems reviewed and are negative.      Objective:   Physical Exam Vitals and nursing note reviewed.  Constitutional:      Appearance: Normal appearance. He is well-developed and well-nourished.  HENT:     Head:  Normocephalic.     Nose: Nose normal.     Mouth/Throat:     Mouth: Oropharynx is clear and moist.  Eyes:     Extraocular Movements: EOM normal.     Pupils: Pupils are equal, round, and reactive to light.  Neck:     Thyroid: No thyroid mass or thyromegaly.     Vascular: No carotid bruit or JVD.     Trachea: Phonation normal.  Cardiovascular:     Rate and Rhythm: Normal rate and regular rhythm.  Pulmonary:     Effort: Pulmonary effort is normal. No respiratory distress.     Breath sounds: Normal breath sounds.  Abdominal:     General: Bowel sounds are normal. Aorta is normal.     Palpations: Abdomen is soft.     Tenderness: There is no abdominal tenderness.  Musculoskeletal:        General: Normal range of motion.     Cervical back: Normal range of motion and neck supple.  Lymphadenopathy:     Cervical: No cervical adenopathy.  Skin:    General: Skin is warm and dry.  Neurological:     Mental Status: He is alert and oriented to person, place, and time.  Psychiatric:        Mood and Affect: Mood and affect normal.        Behavior: Behavior normal.        Thought Content: Thought content normal.        Judgment: Judgment normal.    BP 120/77   Pulse 67   Temp 98.4 F (36.9 C) (Temporal)   Resp 20   Ht 5' 9" (1.753 m)   Wt 206 lb (93.4 kg)   SpO2 96%   BMI 30.42 kg/m    EKG- unchanged from previous-Mary-Margaret Martin, FNP      Assessment & Plan:  Francisco [name] comes in today with chief complaint of Medical Management of Chronic Issues   Diagnosis and orders addressed:  1. Primary hypertension Low sodium diet - furosemide (LASIX) 20 MG tablet; Take 1 tablet (20 mg total) by mouth daily.  Dispense: 90 tablet; Refill: 1 - CBC with Differential/Platelet - CMP14+EGFR  2. Mixed hyperlipidemia Low fat diet - simvastatin (ZOCOR) 40 MG tablet; Take 1 tablet (40 mg total) by mouth at bedtime.  Dispense: 90 tablet; Refill: 1 - Lipid panel  3. Gastroesophageal  reflux disease without esophagitis Avoid spicy foods Do not eat 2 hours prior to bedtime  4. GAD (generalized anxiety disorder) Stress management - escitalopram (LEXAPRO) 10 MG tablet; Take 2 tablets (20 mg total) by mouth daily.  Dispense: 180 tablet; Refill: 1 - ALPRAZolam (XANAX) 1 MG tablet; Take 1 tablet (1 mg total) by mouth 2 (  two) times daily as needed.  Dispense: 60 tablet; Refill: 3  5. Benign prostatic hyperplasia, unspecified whether lower urinary tract symptoms present Keep appointment with urology  6. BMI 31.0-31.9,adult Discussed diet and exercise for person with BMI >25 Will recheck weight in 3-6 months  7. Left arm pain Keep diary of pain- what makes it better and what makes it worse - EKG 12-Lead   Labs pending Health Maintenance reviewed Diet and exercise encouraged  Follow up plan: 3 months   [place], FNP

## 2020-11-20 DIAGNOSIS — R350 Frequency of micturition: Secondary | ICD-10-CM | POA: Diagnosis not present

## 2020-11-20 DIAGNOSIS — R3912 Poor urinary stream: Secondary | ICD-10-CM | POA: Diagnosis not present

## 2020-12-15 ENCOUNTER — Other Ambulatory Visit: Payer: Self-pay | Admitting: Nurse Practitioner

## 2020-12-15 DIAGNOSIS — R42 Dizziness and giddiness: Secondary | ICD-10-CM

## 2021-02-04 ENCOUNTER — Encounter: Payer: Self-pay | Admitting: Nurse Practitioner

## 2021-02-04 ENCOUNTER — Ambulatory Visit (INDEPENDENT_AMBULATORY_CARE_PROVIDER_SITE_OTHER): Payer: Medicare Other | Admitting: Nurse Practitioner

## 2021-02-04 ENCOUNTER — Other Ambulatory Visit: Payer: Self-pay

## 2021-02-04 VITALS — BP 140/81 | HR 65 | Temp 98.4°F | Resp 20 | Ht 69.0 in | Wt 202.0 lb

## 2021-02-04 DIAGNOSIS — F411 Generalized anxiety disorder: Secondary | ICD-10-CM

## 2021-02-04 DIAGNOSIS — K219 Gastro-esophageal reflux disease without esophagitis: Secondary | ICD-10-CM | POA: Diagnosis not present

## 2021-02-04 DIAGNOSIS — E782 Mixed hyperlipidemia: Secondary | ICD-10-CM

## 2021-02-04 DIAGNOSIS — I10 Essential (primary) hypertension: Secondary | ICD-10-CM

## 2021-02-04 DIAGNOSIS — Z6831 Body mass index (BMI) 31.0-31.9, adult: Secondary | ICD-10-CM

## 2021-02-04 DIAGNOSIS — N400 Enlarged prostate without lower urinary tract symptoms: Secondary | ICD-10-CM | POA: Diagnosis not present

## 2021-02-04 MED ORDER — SIMVASTATIN 40 MG PO TABS: 40.0000 mg | ORAL_TABLET | Freq: Every day | ORAL | 1 refills | Status: DC

## 2021-02-04 MED ORDER — ESCITALOPRAM OXALATE 10 MG PO TABS
20.0000 mg | ORAL_TABLET | Freq: Every day | ORAL | 1 refills | Status: DC
Start: 2021-02-04 — End: 2021-05-12

## 2021-02-04 MED ORDER — TAMSULOSIN HCL 0.4 MG PO CAPS
0.4000 mg | ORAL_CAPSULE | Freq: Every day | ORAL | 1 refills | Status: DC
Start: 2021-02-04 — End: 2021-05-12

## 2021-02-04 MED ORDER — FUROSEMIDE 20 MG PO TABS
20.0000 mg | ORAL_TABLET | Freq: Every day | ORAL | 1 refills | Status: DC
Start: 2021-02-04 — End: 2021-05-12

## 2021-02-04 MED ORDER — ALPRAZOLAM 1 MG PO TABS: 1.0000 mg | ORAL_TABLET | Freq: Two times a day (BID) | ORAL | 3 refills | Status: DC | PRN

## 2021-02-04 NOTE — Progress Notes (Signed)
Subjective:    Patient ID: Francisco [name], male    DOB: [birth year], 80 y.o.   MRN: [id number]   Chief Complaint: Chronic disease management   HPI:  1. Primary hypertension Manages with diet and exercise. Checks at home and it runs under 100. Takes lasix and is unsure why. His xanax makes him tired after taking it in the morning. He feels light headed all day. Symptoms are exacerbated with positional changes.  BP Readings from Last 3 Encounters:  02/04/21 140/81  11/05/20 120/77  07/29/20 127/75    2. Gastroesophageal reflux disease without esophagitis Managing with OTC Tums. Avoids spicy foods and eating late night.   3. Benign prostatic hyperplasia, unspecified whether lower urinary tract symptoms present Taking tamsulosin. Having issues urinating. Trouble starting his stream in the morning, but urination picks up in the afternoon.   Lab Results  Component Value Date   PSA1 2.2 04/09/2020   PSA1 1.3 02/09/2018   PSA 1.2 12/27/2014   PSA 1.0 07/14/2013    4. Mixed hyperlipidemia Taking simvastatin. No side effects with this.   Lab Results  Component Value Date   CHOL 154 10/08/2020   HDL 40 10/08/2020   LDLCALC 84 10/08/2020   TRIG 175 (H) 10/08/2020   CHOLHDL 3.9 10/08/2020    5. GAD (generalized anxiety disorder) Taking Xanax and Lexapro. When he does not take his xanax in the morning, he becomes shaky around 1-2pm.   GAD 7 : Generalized Anxiety Score 02/04/2021 04/08/2020 01/26/2020 10/31/2019  Nervous, Anxious, on Edge 1 0 1 1  Control/stop worrying 1 0 1 1  Worry too much - different things 1 0 1 1  Trouble relaxing 0 0 0 1  Restless 0 0 1 0  Easily annoyed or irritable 0 0 0 0  Afraid - awful might happen 0 0 1 0  Total GAD 7 Score 3 0 5 4  Anxiety Difficulty Not difficult at all - Somewhat difficult Somewhat difficult    6. BMI 31.0-31.9,adult Weight is stable.   Wt Readings from Last 3 Encounters:  02/04/21 202 lb (91.6 kg)  11/05/20 206 lb (93.4  kg)  07/29/20 211 lb (95.7 kg)     Outpatient Encounter Medications as of 02/04/2021  Medication Sig  . ALPRAZolam (XANAX) 1 MG tablet Take 1 tablet (1 mg total) by mouth 2 (two) times daily as needed.  Marland Kitchen aspirin 81 MG tablet Take 81 mg by mouth daily.  Marland Kitchen escitalopram (LEXAPRO) 10 MG tablet Take 2 tablets (20 mg total) by mouth daily.  . fish oil-omega-3 fatty acids 1000 MG capsule Take 2 g by mouth daily.  . furosemide (LASIX) 20 MG tablet Take 1 tablet (20 mg total) by mouth daily.  . meclizine (ANTIVERT) 25 MG tablet TAKE 1 TABLET BY MOUTH 3  TIMES DAILY AS NEEDED FOR  DIZZINESS  . meloxicam (MOBIC) 7.5 MG tablet Take 1 tablet by mouth once daily  . simvastatin (ZOCOR) 40 MG tablet Take 1 tablet (40 mg total) by mouth at bedtime.  . tamsulosin (FLOMAX) 0.4 MG CAPS capsule Take 1 capsule (0.4 mg total) by mouth daily.   No facility-administered encounter medications on file as of 02/04/2021.    Past Surgical History:  Procedure Laterality Date  . AMPUTATION Left 08/05/2016   Procedure: REVISION AMPUTATION LEFT INDEX FINGER REPAIR LACERATION LEFT THUMB;  Surgeon: [name], MD;  Location: [facility];  Service: Orthopedics;  Laterality: Left;  . SPINE SURGERY  Family History  Problem Relation Age of Onset  . Healthy Mother   . Transient ischemic attack Father   . Rheum arthritis Daughter   . Amblyopia Neg Hx   . Blindness Neg Hx   . Cataracts Neg Hx   . Glaucoma Neg Hx   . Macular degeneration Neg Hx   . Retinal detachment Neg Hx   . Strabismus Neg Hx   . Retinitis pigmentosa Neg Hx     New complaints: Dizzy spells and recent fall. Fall was from tripping in his garage. No HA or memory lapse.   Scab that wont heal on right ear.   Social history: Lives by himself, widower.   Controlled substance contract: N/A   Review of Systems  Constitutional: Negative for activity change, appetite change, fatigue and fever.  Respiratory: Negative for cough, shortness of breath  and wheezing.   Cardiovascular: Negative for chest pain, palpitations and leg swelling.  Gastrointestinal: Negative for constipation and diarrhea.  Genitourinary: Positive for difficulty urinating and frequency.  Neurological: Positive for dizziness and light-headedness. Negative for headaches.  Psychiatric/Behavioral: Negative for confusion. The patient is not nervous/anxious.        Objective:   Physical Exam Cardiovascular:     Rate and Rhythm: Normal rate and regular rhythm.     Pulses: Normal pulses.     Heart sounds: Normal heart sounds.  Pulmonary:     Effort: Pulmonary effort is normal.     Breath sounds: Normal breath sounds.  Abdominal:     General: Bowel sounds are normal. There is no distension.     Palpations: Abdomen is soft.     Tenderness: There is no abdominal tenderness.  Musculoskeletal:        General: Normal range of motion.     Cervical back: Normal range of motion and neck supple.  Skin:    General: Skin is warm and dry.     Capillary Refill: Capillary refill takes less than 2 seconds.  Neurological:     Mental Status: He is alert and oriented to person, place, and time.  Psychiatric:        Mood and Affect: Mood normal.        Behavior: Behavior normal.        Thought Content: Thought content normal.        Judgment: Judgment normal.    Vitals:   02/04/21 1410  BP: 140/81  Pulse: 65  Resp: 20  Temp: 98.4 F (36.9 C)  SpO2: 95%   Orthostatic BP measurements:  Laying: 144/89 Sitting: 121/77 Standing: 106/62    Assessment & Plan:    Francisco [name] comes in today with chief complaint of Medical Management of Chronic Issues   Diagnosis and orders addressed:  1. Primary hypertension Decrease your lasix to 10mg  qd or 20mg  QOD. Change positions slowly and be sure to drink enough fluids through the day.   - furosemide (LASIX) 20 MG tablet; Take 1 tablet (20 mg total) by mouth daily.  Dispense: 90 tablet; Refill: 1  2. Gastroesophageal  reflux disease without esophagitis Continue OTC Tums, avoid spicy foods and eating late at night.   3. Benign prostatic hyperplasia, unspecified whether lower urinary tract symptoms present Continue medication as prescribed.  - tamsulosin (FLOMAX) 0.4 MG CAPS capsule; Take 1 capsule (0.4 mg total) by mouth daily.  Dispense: 90 capsule; Refill: 1  4. Mixed hyperlipidemia Continue medication as prescribed. Continue low fat diet.  - simvastatin (ZOCOR) 40 MG tablet; Take  1 tablet (40 mg total) by mouth at bedtime.  Dispense: 90 tablet; Refill: 1  5. GAD (generalized anxiety disorder) Continue lexapro and xanax. Report back to the office if symptoms increase.  - escitalopram (LEXAPRO) 10 MG tablet; Take 2 tablets (20 mg total) by mouth daily.  Dispense: 180 tablet; Refill: 1 - ALPRAZolam (XANAX) 1 MG tablet; Take 1 tablet (1 mg total) by mouth 2 (two) times daily as needed.  Dispense: 60 tablet; Refill: 3  6. BMI 31.0-31.9,adult Weight stable. Continue to watch diet and exercise.   7. vertogo Only take antivert when needed- patient has been taking TID to prevent dizziness. This may be adding to some of his fatigue.  Labs pending Health Maintenance reviewed Diet and exercise encouraged  Follow up plan: 3 months   [name], FNP

## 2021-02-04 NOTE — Patient Instructions (Signed)

## 2021-02-05 LAB — CBC WITH DIFFERENTIAL/PLATELET
Basophils Absolute: 0 10*3/uL (ref 0.0–0.2)
Basos: 0 %
EOS (ABSOLUTE): 0.1 10*3/uL (ref 0.0–0.4)
Eos: 2 %
Hematocrit: 40.4 % (ref 37.5–51.0)
Hemoglobin: 13.9 g/dL (ref 13.0–17.7)
Immature Grans (Abs): 0 10*3/uL (ref 0.0–0.1)
Immature Granulocytes: 0 %
Lymphocytes Absolute: 1.6 10*3/uL (ref 0.7–3.1)
Lymphs: 23 %
MCH: 33.1 pg — ABNORMAL HIGH (ref 26.6–33.0)
MCHC: 34.4 g/dL (ref 31.5–35.7)
MCV: 96 fL (ref 79–97)
Monocytes Absolute: 0.4 10*3/uL (ref 0.1–0.9)
Monocytes: 6 %
Neutrophils Absolute: 4.5 10*3/uL (ref 1.4–7.0)
Neutrophils: 69 %
Platelets: 155 10*3/uL (ref 150–450)
RBC: 4.2 x10E6/uL (ref 4.14–5.80)
RDW: 13.3 % (ref 11.6–15.4)
WBC: 6.7 10*3/uL (ref 3.4–10.8)

## 2021-02-05 LAB — LIPID PANEL
Chol/HDL Ratio: 3.5 ratio (ref 0.0–5.0)
Cholesterol, Total: 145 mg/dL (ref 100–199)
HDL: 41 mg/dL (ref >39)
LDL Chol Calc (NIH): 75 mg/dL (ref 0–99)
Triglycerides: 171 mg/dL — ABNORMAL HIGH (ref 0–149)
VLDL Cholesterol Cal: 29 mg/dL (ref 5–40)

## 2021-02-05 LAB — CMP14+EGFR
ALT: 11 IU/L (ref 0–44)
AST: 14 IU/L (ref 0–40)
Albumin/Globulin Ratio: 3.2 — ABNORMAL HIGH (ref 1.2–2.2)
Albumin: 4.5 g/dL (ref 3.7–4.7)
Alkaline Phosphatase: 48 IU/L (ref 44–121)
BUN/Creatinine Ratio: 16 (ref 10–24)
BUN: 19 mg/dL (ref 8–27)
Bilirubin Total: 0.5 mg/dL (ref 0.0–1.2)
CO2: 23 mmol/L (ref 20–29)
Calcium: 8.6 mg/dL (ref 8.6–10.2)
Chloride: 102 mmol/L (ref 96–106)
Creatinine, Ser: 1.22 mg/dL (ref 0.76–1.27)
Globulin, Total: 1.4 g/dL — ABNORMAL LOW (ref 1.5–4.5)
Glucose: 107 mg/dL — ABNORMAL HIGH (ref 65–99)
Potassium: 4.4 mmol/L (ref 3.5–5.2)
Sodium: 141 mmol/L (ref 134–144)
Total Protein: 5.9 g/dL — ABNORMAL LOW (ref 6.0–8.5)
eGFR: 60 mL/min/{1.73_m2} (ref >59)

## 2021-02-13 ENCOUNTER — Other Ambulatory Visit: Payer: Self-pay | Admitting: Nurse Practitioner

## 2021-05-09 ENCOUNTER — Other Ambulatory Visit: Payer: Self-pay | Admitting: Nurse Practitioner

## 2021-05-09 DIAGNOSIS — R42 Dizziness and giddiness: Secondary | ICD-10-CM

## 2021-05-12 ENCOUNTER — Other Ambulatory Visit: Payer: Self-pay

## 2021-05-12 ENCOUNTER — Encounter: Payer: Self-pay | Admitting: Nurse Practitioner

## 2021-05-12 ENCOUNTER — Ambulatory Visit (INDEPENDENT_AMBULATORY_CARE_PROVIDER_SITE_OTHER): Payer: Medicare Other | Admitting: Nurse Practitioner

## 2021-05-12 VITALS — BP 115/75 | HR 59 | Temp 98.3°F | Resp 20 | Ht 69.0 in | Wt 202.0 lb

## 2021-05-12 DIAGNOSIS — F411 Generalized anxiety disorder: Secondary | ICD-10-CM

## 2021-05-12 DIAGNOSIS — I10 Essential (primary) hypertension: Secondary | ICD-10-CM

## 2021-05-12 DIAGNOSIS — N400 Enlarged prostate without lower urinary tract symptoms: Secondary | ICD-10-CM

## 2021-05-12 DIAGNOSIS — Z6831 Body mass index (BMI) 31.0-31.9, adult: Secondary | ICD-10-CM

## 2021-05-12 DIAGNOSIS — E782 Mixed hyperlipidemia: Secondary | ICD-10-CM | POA: Diagnosis not present

## 2021-05-12 DIAGNOSIS — R5383 Other fatigue: Secondary | ICD-10-CM | POA: Diagnosis not present

## 2021-05-12 DIAGNOSIS — K219 Gastro-esophageal reflux disease without esophagitis: Secondary | ICD-10-CM | POA: Diagnosis not present

## 2021-05-12 MED ORDER — ESCITALOPRAM OXALATE 10 MG PO TABS: 20.0000 mg | ORAL_TABLET | Freq: Every day | ORAL | 1 refills | Status: DC

## 2021-05-12 MED ORDER — ALPRAZOLAM 1 MG PO TABS: 1.0000 mg | ORAL_TABLET | Freq: Two times a day (BID) | ORAL | 3 refills | Status: DC | PRN

## 2021-05-12 MED ORDER — MELOXICAM 7.5 MG PO TABS: 7.5000 mg | ORAL_TABLET | Freq: Every day | ORAL | 1 refills | Status: DC

## 2021-05-12 MED ORDER — FUROSEMIDE 20 MG PO TABS: 20.0000 mg | ORAL_TABLET | Freq: Every day | ORAL | 1 refills | Status: DC

## 2021-05-12 MED ORDER — SIMVASTATIN 40 MG PO TABS: 40.0000 mg | ORAL_TABLET | Freq: Every day | ORAL | 1 refills | Status: DC

## 2021-05-12 MED ORDER — TAMSULOSIN HCL 0.4 MG PO CAPS: 0.4000 mg | ORAL_CAPSULE | Freq: Every day | ORAL | 1 refills | Status: DC

## 2021-05-12 NOTE — Patient Instructions (Signed)
Recombinant Zoster (Shingles) Vaccine: What You Need to Know 1. Why get vaccinated? Recombinant zoster (shingles) vaccine can prevent shingles. Shingles (also called herpes zoster, or just zoster) is a painful skin rash, usually with blisters. In addition to the rash, shingles can cause fever, headache, chills, or upset stomach. More rarely, shingles can lead to pneumonia, hearingproblems, blindness, brain inflammation (encephalitis), or death. The most common complication of shingles is long-term nerve pain called postherpetic neuralgia (PHN). PHN occurs in the areas where the shingles rash was, even after the rash clears up. It can last for months or years after therash goes away. The pain from PHN can be severe and debilitating. About 10 to 18% of people who get shingles will experience PHN. The risk of PHN increases with age. An older adult with shingles is more likely to develop PHN and have longer lasting and more severe pain than a younger person withshingles. Shingles is caused by the varicella zoster virus, the same virus that causes chickenpox. After you have chickenpox, the virus stays in your body and can cause shingles later in life. Shingles cannot be passed from one person to another, but the virus that causes shingles can spread and cause chickenpox insomeone who had never had chickenpox or received chickenpox vaccine. 2. Recombinant shingles vaccine Recombinant shingles vaccine provides strong protection against shingles. Bypreventing shingles, recombinant shingles vaccine also protects against PHN. Recombinant shingles vaccine is the preferred vaccine for the prevention of shingles. However, a different vaccine, live shingles vaccine, may be used in somecircumstances. The recombinant shingles vaccine is recommended for adults 50 years and older without serious immune problems. It is given as a two-dose series. This vaccine is also recommended for people who have already gotten another  type of shingles vaccine, the live shingles vaccine. There is no live virus inthis vaccine. Shingles vaccine may be given at the same time as other vaccines. 3. Talk with your health care provider Tell your vaccine provider if the person getting the vaccine: Has had an allergic reaction after a previous dose of recombinant shingles vaccine, or has any severe, life-threatening allergies. Is pregnant or breastfeeding. Is currently experiencing an episode of shingles. In some cases, your health care provider may decide to postpone shinglesvaccination to a future visit. People with minor illnesses, such as a cold, may be vaccinated. People who are moderately or severely ill should usually wait until they recover beforegetting recombinant shingles vaccine. Your health care provider can give you more information. 4. Risks of a vaccine reaction A sore arm with mild or moderate pain is very common after recombinant shingles vaccine, affecting about 80% of vaccinated people. Redness and swelling can also happen at the site of the injection. Tiredness, muscle pain, headache, shivering, fever, stomach pain, and nausea happen after vaccination in more than half of people who receive recombinant shingles vaccine. In clinical trials, about 1 out of 6 people who got recombinant zoster vaccine experienced side effects that prevented them from doing regular activities.Symptoms usually went away on their own in 2 to 3 days. You should still get the second dose of recombinant zoster vaccine even if youhad one of these reactions after the first dose. People sometimes faint after medical procedures, including vaccination. Tellyour provider if you feel dizzy or have vision changes or ringing in the ears. As with any medicine, there is a very remote chance of a vaccine causing asevere allergic reaction, other serious injury, or death. 5. What if there is a serious problem? An  allergic reaction could occur after the  vaccinated person leaves the clinic. If you see signs of a severe allergic reaction (hives, swelling of the face and throat, difficulty breathing, a fast heartbeat, dizziness, or weakness), call 9-1-1 and get the person to the nearest hospital. For other signs that concern you, call your health care provider. Adverse reactions should be reported to the Vaccine Adverse Event Reporting System (VAERS). Your health care provider will usually file this report, or you can do it yourself. Visit the VAERS website at www.vaers.SamedayNews.es or call (340)657-4100. VAERS is only for reporting reactions, and VAERS staff do not give medical advice. 6. How can I learn more? Ask your health care provider. Call your local or state health department. Contact the Centers for Disease Control and Prevention (CDC): Call 660-691-4856 (1-800-CDC-INFO) or Visit CDC's website at http://hunter.com/ Vaccine Information Statement Recombinant Zoster Vaccine (09/14/2018) This information is not intended to replace advice given to you by your health care provider. Make sure you discuss any questions you have with your healthcare provider. Document Revised: 07/05/2020 Document Reviewed: 07/05/2020 Elsevier Patient Education  [place].

## 2021-05-12 NOTE — Progress Notes (Signed)
Subjective:    Patient ID: Francisco [name], male    DOB: 04-23-1941, 80 y.o.   MRN: [id number]   Chief Complaint: Medical Management of Chronic Issues    HPI:  1. Primary hypertension No c/o chest pain, sob ir headache. Does not chcek blood pressure at home. BP Readings from Last 3 Encounters:  05/12/21 115/75  02/04/21 140/81  11/05/20 120/77     2. Mixed hyperlipidemia Doe snot watch diet and does very little exercise Lab Results  Component Value Date   CHOL 145 02/04/2021   HDL 41 02/04/2021   LDLCALC 75 02/04/2021   TRIG 171 (H) 02/04/2021   CHOLHDL 3.5 02/04/2021     3. Fatigue, unspecified type Stays tired  4. Gastroesophageal reflux disease without esophagitis .is currently not on any meds. Onlt does OTC meds when needed  5. Benign prostatic hyperplasia, unspecified whether lower urinary tract symptoms present Is on flomax daily and is doing well.   6. GAD (generalized anxiety disorder) Stays stressed.  GAD 7 : Generalized Anxiety Score 05/12/2021 02/04/2021 04/08/2020 01/26/2020  Nervous, Anxious, on Edge 1 1 0 1  Control/stop worrying 1 1 0 1  Worry too much - different things 0 1 0 1  Trouble relaxing 0 0 0 0  Restless 0 0 0 1  Easily annoyed or irritable 0 0 0 0  Afraid - awful might happen 1 0 0 1  Total GAD 7 Score 3 3 0 5  Anxiety Difficulty Not difficult at all Not difficult at all - Somewhat difficult      7. BMI 31.0-31.9,adult No recent weight changes Wt Readings from Last 3 Encounters:  05/12/21 202 lb (91.6 kg)  02/04/21 202 lb (91.6 kg)  11/05/20 206 lb (93.4 kg)   BMI Readings from Last 3 Encounters:  05/12/21 29.83 kg/m  02/04/21 29.83 kg/m  11/05/20 30.42 kg/m       Outpatient Encounter Medications as of 05/12/2021  Medication Sig   ALPRAZolam (XANAX) 1 MG tablet Take 1 tablet (1 mg total) by mouth 2 (two) times daily as needed.   aspirin 81 MG tablet Take 81 mg by mouth daily.   escitalopram (LEXAPRO) 10 MG tablet  Take 2 tablets (20 mg total) by mouth daily.   fish oil-omega-3 fatty acids 1000 MG capsule Take 2 g by mouth daily.   furosemide (LASIX) 20 MG tablet Take 1 tablet (20 mg total) by mouth daily.   meclizine (ANTIVERT) 25 MG tablet TAKE 1 TABLET BY MOUTH 3  TIMES DAILY AS NEEDED FOR  DIZZINESS   meloxicam (MOBIC) 7.5 MG tablet TAKE 1 TABLET BY MOUTH  DAILY   simvastatin (ZOCOR) 40 MG tablet Take 1 tablet (40 mg total) by mouth at bedtime.   tamsulosin (FLOMAX) 0.4 MG CAPS capsule Take 1 capsule (0.4 mg total) by mouth daily.   No facility-administered encounter medications on file as of 05/12/2021.    Past Surgical History:  Procedure Laterality Date   AMPUTATION Left 08/05/2016   Procedure: REVISION AMPUTATION LEFT INDEX FINGER REPAIR LACERATION LEFT THUMB;  Surgeon: [name], MD;  Location: [facility];  Service: Orthopedics;  Laterality: Left;   SPINE SURGERY      Family History  Problem Relation Age of Onset   Healthy Mother    Transient ischemic attack Father    Rheum arthritis Daughter    Amblyopia Neg Hx    Blindness Neg Hx    Cataracts Neg Hx    Glaucoma Neg Hx  Macular degeneration Neg Hx    Retinal detachment Neg Hx    Strabismus Neg Hx    Retinitis pigmentosa Neg Hx     New complaints: None today  Social history: Lives by hisself since his wife died several years ago. His daughters check on him daily  Controlled substance contract: 07/30/20     Review of Systems  Constitutional:  Negative for diaphoresis.  Eyes:  Negative for pain.  Respiratory:  Negative for shortness of breath.   Cardiovascular:  Negative for chest pain, palpitations and leg swelling.  Gastrointestinal:  Negative for abdominal pain.  Endocrine: Negative for polydipsia.  Skin:  Negative for rash.  Neurological:  Negative for dizziness, weakness and headaches.  Hematological:  Does not bruise/bleed easily.  All other systems reviewed and are negative.     Objective:   Physical  Exam Vitals and nursing note reviewed.  Constitutional:      Appearance: Normal appearance. He is well-developed.  HENT:     Head: Normocephalic.     Nose: Nose normal.  Eyes:     Pupils: Pupils are equal, round, and reactive to light.  Neck:     Thyroid: No thyroid mass or thyromegaly.     Vascular: No carotid bruit or JVD.     Trachea: Phonation normal.  Cardiovascular:     Rate and Rhythm: Normal rate and regular rhythm.  Pulmonary:     Effort: Pulmonary effort is normal. No respiratory distress.     Breath sounds: Normal breath sounds.  Abdominal:     General: Bowel sounds are normal.     Palpations: Abdomen is soft.     Tenderness: There is no abdominal tenderness.     Comments: umbilical hernia  Musculoskeletal:        General: Normal range of motion.     Cervical back: Normal range of motion and neck supple.     Comments: missing left index finger  Lymphadenopathy:     Cervical: No cervical adenopathy.  Skin:    General: Skin is warm and dry.  Neurological:     Mental Status: He is alert and oriented to person, place, and time.  Psychiatric:        Behavior: Behavior normal.        Thought Content: Thought content normal.        Judgment: Judgment normal.   BP 115/75   Pulse (!) 59   Temp 98.3 F (36.8 C) (Temporal)   Resp 20   Ht 5' 9" (1.753 m)   Wt 202 lb (91.6 kg)   SpO2 96%   BMI 29.83 kg/m   Blood sugar 105 ( patient request )        Assessment & Plan:   ATLEE KLUTH comes in today with chief complaint of Medical Management of Chronic Issues   Diagnosis and orders addressed:  1. Primary hypertension Low sodium diet - CBC with Differential/Platelet - CMP14+EGFR - furosemide (LASIX) 20 MG tablet; Take 1 tablet (20 mg total) by mouth daily.  Dispense: 90 tablet; Refill: 1  2. Mixed hyperlipidemia Low fat diet - Lipid panel - simvastatin (ZOCOR) 40 MG tablet; Take 1 tablet (40 mg total) by mouth at bedtime.  Dispense: 90 tablet;  Refill: 1  3. Fatigue, unspecified type - Glucose Hemocue Waived  4. Gastroesophageal reflux disease without esophagitis Avoid spicy foods Do not eat 2 hours prior to bedtime   5. Benign prostatic hyperplasia, unspecified whether lower urinary tract symptoms present Report  any problems voiding - tamsulosin (FLOMAX) 0.4 MG CAPS capsule; Take 1 capsule (0.4 mg total) by mouth daily.  Dispense: 90 capsule; Refill: 1  6. GAD (generalized anxiety disorder) Stress management - escitalopram (LEXAPRO) 10 MG tablet; Take 2 tablets (20 mg total) by mouth daily.  Dispense: 180 tablet; Refill: 1 - ALPRAZolam (XANAX) 1 MG tablet; Take 1 tablet (1 mg total) by mouth 2 (two) times daily as needed.  Dispense: 60 tablet; Refill: 3  7. BMI 31.0-31.9,adult Discussed diet and exercise for person with BMI >25 Will recheck weight in 3-6 months   Labs pending Health Maintenance reviewed Diet and exercise encouraged  Follow up plan: 6 months   [name], FNP

## 2021-05-13 LAB — LIPID PANEL
Chol/HDL Ratio: 3.8 ratio (ref 0.0–5.0)
Cholesterol, Total: 150 mg/dL (ref 100–199)
HDL: 39 mg/dL — ABNORMAL LOW (ref >39)
LDL Chol Calc (NIH): 78 mg/dL (ref 0–99)
Triglycerides: 195 mg/dL — ABNORMAL HIGH (ref 0–149)
VLDL Cholesterol Cal: 33 mg/dL (ref 5–40)

## 2021-05-13 LAB — CBC WITH DIFFERENTIAL/PLATELET
Basophils Absolute: 0 10*3/uL (ref 0.0–0.2)
Basos: 1 %
EOS (ABSOLUTE): 0.2 10*3/uL (ref 0.0–0.4)
Eos: 3 %
Hematocrit: 40.5 % (ref 37.5–51.0)
Hemoglobin: 14.2 g/dL (ref 13.0–17.7)
Immature Grans (Abs): 0 10*3/uL (ref 0.0–0.1)
Immature Granulocytes: 0 %
Lymphocytes Absolute: 1.4 10*3/uL (ref 0.7–3.1)
Lymphs: 23 %
MCH: 33.3 pg — ABNORMAL HIGH (ref 26.6–33.0)
MCHC: 35.1 g/dL (ref 31.5–35.7)
MCV: 95 fL (ref 79–97)
Monocytes Absolute: 0.4 10*3/uL (ref 0.1–0.9)
Monocytes: 7 %
Neutrophils Absolute: 4.3 10*3/uL (ref 1.4–7.0)
Neutrophils: 66 %
Platelets: 167 10*3/uL (ref 150–450)
RBC: 4.26 x10E6/uL (ref 4.14–5.80)
RDW: 13.2 % (ref 11.6–15.4)
WBC: 6.4 10*3/uL (ref 3.4–10.8)

## 2021-05-13 LAB — CMP14+EGFR
ALT: 10 IU/L (ref 0–44)
AST: 11 IU/L (ref 0–40)
Albumin/Globulin Ratio: 3.1 — ABNORMAL HIGH (ref 1.2–2.2)
Albumin: 4.4 g/dL (ref 3.7–4.7)
Alkaline Phosphatase: 52 IU/L (ref 44–121)
BUN/Creatinine Ratio: 16 (ref 10–24)
BUN: 20 mg/dL (ref 8–27)
Bilirubin Total: 0.5 mg/dL (ref 0.0–1.2)
CO2: 26 mmol/L (ref 20–29)
Calcium: 8.9 mg/dL (ref 8.6–10.2)
Chloride: 102 mmol/L (ref 96–106)
Creatinine, Ser: 1.29 mg/dL — ABNORMAL HIGH (ref 0.76–1.27)
Globulin, Total: 1.4 g/dL — ABNORMAL LOW (ref 1.5–4.5)
Glucose: 91 mg/dL (ref 65–99)
Potassium: 4.4 mmol/L (ref 3.5–5.2)
Sodium: 138 mmol/L (ref 134–144)
Total Protein: 5.8 g/dL — ABNORMAL LOW (ref 6.0–8.5)
eGFR: 56 mL/min/{1.73_m2} — ABNORMAL LOW (ref >59)

## 2021-05-13 LAB — GLUCOSE HEMOCUE WAIVED: Glu Hemocue Waived: 105 mg/dL — ABNORMAL HIGH (ref 65–99)

## 2021-06-23 DIAGNOSIS — J029 Acute pharyngitis, unspecified: Secondary | ICD-10-CM | POA: Diagnosis not present

## 2021-06-23 DIAGNOSIS — J020 Streptococcal pharyngitis: Secondary | ICD-10-CM | POA: Diagnosis not present

## 2021-06-23 DIAGNOSIS — K2100 Gastro-esophageal reflux disease with esophagitis, without bleeding: Secondary | ICD-10-CM | POA: Diagnosis not present

## 2021-06-23 DIAGNOSIS — R0789 Other chest pain: Secondary | ICD-10-CM | POA: Diagnosis not present

## 2021-07-30 DIAGNOSIS — J069 Acute upper respiratory infection, unspecified: Secondary | ICD-10-CM | POA: Diagnosis not present

## 2021-07-30 DIAGNOSIS — Z20822 Contact with and (suspected) exposure to covid-19: Secondary | ICD-10-CM | POA: Diagnosis not present

## 2021-08-07 ENCOUNTER — Telehealth: Payer: Self-pay | Admitting: Nurse Practitioner

## 2021-08-07 NOTE — Telephone Encounter (Signed)
Left message for patient to call back and schedule Medicare Annual Wellness Visit (AWV)   Last AWV  09/22/18  please schedule at anytime with health coach  This should be a 45 minute visit.

## 2021-08-08 ENCOUNTER — Ambulatory Visit (INDEPENDENT_AMBULATORY_CARE_PROVIDER_SITE_OTHER): Payer: Medicare Other

## 2021-08-08 VITALS — Wt 202.0 lb

## 2021-08-08 DIAGNOSIS — Z0000 Encounter for general adult medical examination without abnormal findings: Secondary | ICD-10-CM

## 2021-08-08 NOTE — Patient Instructions (Signed)
Francisco [name] , Thank you for taking time to come for your Medicare Wellness Visit. I appreciate your ongoing commitment to your health goals. Please review the following plan we discussed and let me know if I can assist you in the future.   Screening recommendations/referrals: Colonoscopy: Done 2011 - repeat not required Recommended yearly ophthalmology/optometry visit for glaucoma screening and checkup Recommended yearly dental visit for hygiene and checkup  Vaccinations: Influenza vaccine: Done 09/12/2020 - Repeat annually Pneumococcal vaccine: Done 10/18/2013 & 06/16/2015 Tdap vaccine: Done 08/05/2016 - Repeat in 10 years Shingles vaccine: Due Shingrix discussed. Please contact your pharmacy for coverage information.     Covid-19: Done 12/09/2019, 01/06/2020, & 10/16/2020  Advanced directives: Please bring a copy of your health care power of attorney and living will to the office to be added to your chart at your convenience.   Conditions/risks identified: Aim for 30 minutes of exercise or brisk walking each day, drink 6-8 glasses of water and eat lots of fruits and vegetables.   Next appointment: Follow up in one year for your annual wellness visit.   Preventive Care 49 Years and Older, Male  Preventive care refers to lifestyle choices and visits with your health care provider that can promote health and wellness. What does preventive care include? A yearly physical exam. This is also called an annual well check. Dental exams once or twice a year. Routine eye exams. Ask your health care provider how often you should have your eyes checked. Personal lifestyle choices, including: Daily care of your teeth and gums. Regular physical activity. Eating a healthy diet. Avoiding tobacco and drug use. Limiting alcohol use. Practicing safe sex. Taking low doses of aspirin every day. Taking vitamin and mineral supplements as recommended by your health care provider. What happens during an annual  well check? The services and screenings done by your health care provider during your annual well check will depend on your age, overall health, lifestyle risk factors, and family history of disease. Counseling  Your health care provider may ask you questions about your: Alcohol use. Tobacco use. Drug use. Emotional well-being. Home and relationship well-being. Sexual activity. Eating habits. History of falls. Memory and ability to understand (cognition). Work and work Statistician. Screening  You may have the following tests or measurements: Height, weight, and BMI. Blood pressure. Lipid and cholesterol levels. These may be checked every 5 years, or more frequently if you are over 78 years old. Skin check. Lung cancer screening. You may have this screening every year starting at age 70 if you have a 30-pack-year history of smoking and currently smoke or have quit within the past 15 years. Fecal occult blood test (FOBT) of the stool. You may have this test every year starting at age 36. Flexible sigmoidoscopy or colonoscopy. You may have a sigmoidoscopy every 5 years or a colonoscopy every 10 years starting at age 76. Prostate cancer screening. Recommendations will vary depending on your family history and other risks. Hepatitis C blood test. Hepatitis B blood test. Sexually transmitted disease (STD) testing. Diabetes screening. This is done by checking your blood sugar (glucose) after you have not eaten for a while (fasting). You may have this done every 1-3 years. Abdominal aortic aneurysm (AAA) screening. You may need this if you are a current or former smoker. Osteoporosis. You may be screened starting at age 59 if you are at high risk. Talk with your health care provider about your test results, treatment options, and if necessary, the need  for more tests. Vaccines  Your health care provider may recommend certain vaccines, such as: Influenza vaccine. This is recommended every  year. Tetanus, diphtheria, and acellular pertussis (Tdap, Td) vaccine. You may need a Td booster every 10 years. Zoster vaccine. You may need this after age 79. Pneumococcal 13-valent conjugate (PCV13) vaccine. One dose is recommended after age 81. Pneumococcal polysaccharide (PPSV23) vaccine. One dose is recommended after age 70. Talk to your health care provider about which screenings and vaccines you need and how often you need them. This information is not intended to replace advice given to you by your health care provider. Make sure you discuss any questions you have with your health care provider. Document Released: 11/29/2015 Document Revised: 07/22/2016 Document Reviewed: 09/03/2015 Elsevier Interactive Patient Education  2017 [place] Prevention in the Home Falls can cause injuries. They can happen to people of all ages. There are many things you can do to make your home safe and to help prevent falls. What can I do on the outside of my home? Regularly fix the edges of walkways and driveways and fix any cracks. Remove anything that might make you trip as you walk through a door, such as a raised step or threshold. Trim any bushes or trees on the path to your home. Use bright outdoor lighting. Clear any walking paths of anything that might make someone trip, such as rocks or tools. Regularly check to see if handrails are loose or broken. Make sure that both sides of any steps have handrails. Any raised decks and porches should have guardrails on the edges. Have any leaves, snow, or ice cleared regularly. Use sand or salt on walking paths during winter. Clean up any spills in your garage right away. This includes oil or grease spills. What can I do in the bathroom? Use night lights. Install grab bars by the toilet and in the tub and shower. Do not use towel bars as grab bars. Use non-skid mats or decals in the tub or shower. If you need to sit down in the shower, use a  plastic, non-slip stool. Keep the floor dry. Clean up any water that spills on the floor as soon as it happens. Remove soap buildup in the tub or shower regularly. Attach bath mats securely with double-sided non-slip rug tape. Do not have throw rugs and other things on the floor that can make you trip. What can I do in the bedroom? Use night lights. Make sure that you have a light by your bed that is easy to reach. Do not use any sheets or blankets that are too big for your bed. They should not hang down onto the floor. Have a firm chair that has side arms. You can use this for support while you get dressed. Do not have throw rugs and other things on the floor that can make you trip. What can I do in the kitchen? Clean up any spills right away. Avoid walking on wet floors. Keep items that you use a lot in easy-to-reach places. If you need to reach something above you, use a strong step stool that has a grab bar. Keep electrical cords out of the way. Do not use floor polish or wax that makes floors slippery. If you must use wax, use non-skid floor wax. Do not have throw rugs and other things on the floor that can make you trip. What can I do with my stairs? Do not leave any items on the stairs.  Make sure that there are handrails on both sides of the stairs and use them. Fix handrails that are broken or loose. Make sure that handrails are as long as the stairways. Check any carpeting to make sure that it is firmly attached to the stairs. Fix any carpet that is loose or worn. Avoid having throw rugs at the top or bottom of the stairs. If you do have throw rugs, attach them to the floor with carpet tape. Make sure that you have a light switch at the top of the stairs and the bottom of the stairs. If you do not have them, ask someone to add them for you. What else can I do to help prevent falls? Wear shoes that: Do not have high heels. Have rubber bottoms. Are comfortable and fit you  well. Are closed at the toe. Do not wear sandals. If you use a stepladder: Make sure that it is fully opened. Do not climb a closed stepladder. Make sure that both sides of the stepladder are locked into place. Ask someone to hold it for you, if possible. Clearly mark and make sure that you can see: Any grab bars or handrails. First and last steps. Where the edge of each step is. Use tools that help you move around (mobility aids) if they are needed. These include: Canes. Walkers. Scooters. Crutches. Turn on the lights when you go into a dark area. Replace any light bulbs as soon as they burn out. Set up your furniture so you have a clear path. Avoid moving your furniture around. If any of your floors are uneven, fix them. If there are any pets around you, be aware of where they are. Review your medicines with your doctor. Some medicines can make you feel dizzy. This can increase your chance of falling. Ask your doctor what other things that you can do to help prevent falls. This information is not intended to replace advice given to you by your health care provider. Make sure you discuss any questions you have with your health care provider. Document Released: 08/29/2009 Document Revised: 04/09/2016 Document Reviewed: 12/07/2014 Elsevier Interactive Patient Education  2017 [employer].

## 2021-08-08 NOTE — Progress Notes (Signed)
Subjective:   Francisco [name] is a 80 y.o. male who presents for Medicare Annual/Subsequent preventive examination.  Virtual Visit via Telephone Note  I connected with  Francisco [name] on 08/08/21 at 11:15 AM EDT by telephone and verified that I am speaking with the correct person using two identifiers.  Location: Patient: Home Provider: [facility] Persons participating in the virtual visit: patient/Nurse Health Advisor   I discussed the limitations, risks, security and privacy concerns of performing an evaluation and management service by telephone and the availability of in person appointments. The patient expressed understanding and agreed to proceed.  Interactive audio and video telecommunications were attempted between this nurse and patient, however failed, due to patient having technical difficulties OR patient did not have access to video capability.  We continued and completed visit with audio only.  Some vital signs may be absent or patient reported.   Chenita Ruda E Sultan Pargas, LPN   Review of Systems     Cardiac Risk Factors include: advanced age (>80men, >79 women);male gender;dyslipidemia;sedentary lifestyle     Objective:    Today's Vitals   08/08/21 1118  Weight: 202 lb (91.6 kg)   Body mass index is 29.83 kg/m.  Advanced Directives 08/08/2021 09/22/2018 09/14/2017 08/05/2016 08/05/2016  Does Patient Have a Medical Advance Directive? Yes Yes No Yes Yes  Type of Paramedic of [place];Living will [place];Living will - [place];Living will -  Does patient want to make changes to medical advance directive? - No - Patient declined - - -  Copy of [place] in Chart? No - copy requested No - copy requested - No - copy requested -  Would patient like information on creating a medical advance directive? - - Yes (MAU/Ambulatory/Procedural Areas - Information given) - -    Current Medications  (verified) Outpatient Encounter Medications as of 08/08/2021  Medication Sig   ALPRAZolam (XANAX) 1 MG tablet Take 1 tablet (1 mg total) by mouth 2 (two) times daily as needed.   aspirin 81 MG tablet Take 81 mg by mouth daily.   brimonidine (ALPHAGAN) 0.2 % ophthalmic solution brimonidine 0.2 % eye drops   escitalopram (LEXAPRO) 10 MG tablet Take 2 tablets (20 mg total) by mouth daily.   fish oil-omega-3 fatty acids 1000 MG capsule Take 2 g by mouth daily.   furosemide (LASIX) 20 MG tablet Take 1 tablet (20 mg total) by mouth daily.   meclizine (ANTIVERT) 25 MG tablet TAKE 1 TABLET BY MOUTH 3  TIMES DAILY AS NEEDED FOR  DIZZINESS   meloxicam (MOBIC) 7.5 MG tablet Take 1 tablet (7.5 mg total) by mouth daily.   pantoprazole (PROTONIX) 20 MG tablet Take 20 mg by mouth daily.   simvastatin (ZOCOR) 40 MG tablet Take 1 tablet (40 mg total) by mouth at bedtime.   tamsulosin (FLOMAX) 0.4 MG CAPS capsule Take 1 capsule (0.4 mg total) by mouth daily.   No facility-administered encounter medications on file as of 08/08/2021.    Allergies (verified) Crestor [rosuvastatin], Lipitor [atorvastatin], Penicillins, and Symbicort [budesonide-formoterol fumarate]   History: Past Medical History:  Diagnosis Date   Anxiety    GERD (gastroesophageal reflux disease)    Hypertension    Past Surgical History:  Procedure Laterality Date   AMPUTATION Left 08/05/2016   Procedure: REVISION AMPUTATION LEFT INDEX FINGER REPAIR LACERATION LEFT THUMB;  Surgeon: [name], MD;  Location: [facility];  Service: Orthopedics;  Laterality: Left;   SPINE SURGERY  Family History  Problem Relation Age of Onset   Healthy Mother    Transient ischemic attack Father    Rheum arthritis Daughter    Amblyopia Neg Hx    Blindness Neg Hx    Cataracts Neg Hx    Glaucoma Neg Hx    Macular degeneration Neg Hx    Retinal detachment Neg Hx    Strabismus Neg Hx    Retinitis pigmentosa Neg Hx    Social History   Socioeconomic  History   Marital status: Widowed    Spouse name: Not on file   Number of children: 2   Years of education: Not on file   Highest education level: Not on file  Occupational History   Occupation: Retired    Fish farm manager: [employer]  Tobacco Use   Smoking status: Former    Types: Cigarettes    Quit date: 02/23/1972    Years since quitting: 49.4   Smokeless tobacco: Never  Vaping Use   Vaping Use: Never used  Substance and Sexual Activity   Alcohol use: Yes    Alcohol/week: 1.0 standard drink    Types: 1 Standard drinks or equivalent per week    Comment: occasional beer   Drug use: No   Sexual activity: Not Currently  Other Topics Concern   Not on file  Social History Narrative   2 daughters nearby   Social Determinants of Health   Financial Resource Strain: Low Risk    Difficulty of Paying Living Expenses: Not hard at all  Food Insecurity: No Food Insecurity   Worried About Charity fundraiser in the Last Year: Never true   Arboriculturist in the Last Year: Never true  Transportation Needs: No Transportation Needs   Lack of Transportation (Medical): No   Lack of Transportation (Non-Medical): No  Physical Activity: Inactive   Days of Exercise per Week: 0 days   Minutes of Exercise per Session: 0 min  Stress: No Stress Concern Present   Feeling of Stress : Not at all  Social Connections: Socially Isolated   Frequency of Communication with Friends and Family: More than three times a week   Frequency of Social Gatherings with Friends and Family: More than three times a week   Attends Religious Services: Never   Marine scientist or Organizations: No   Attends Archivist Meetings: Never   Marital Status: Widowed    Tobacco Counseling Counseling given: Not Answered   Clinical Intake:  Pre-visit preparation completed: Yes  Pain : No/denies pain     BMI - recorded: 29.83 Nutritional Status: BMI 25 -29 Overweight Nutritional Risks: None Diabetes:  No  How often do you need to have someone help you when you read instructions, pamphlets, or other written materials from your doctor or pharmacy?: 1 - Never  Diabetic? No  Interpreter Needed?: No  Information entered by :: Nikkia Devoss, LPN   Activities of Daily Living In your present state of health, do you have any difficulty performing the following activities: 08/08/2021 10/07/2020  Hearing? N N  Vision? Y N  Difficulty concentrating or making decisions? N N  Walking or climbing stairs? N N  Dressing or bathing? N N  Doing errands, shopping? N N  Preparing Food and eating ? N -  Using the Toilet? N -  In the past six months, have you accidently leaked urine? N -  Do you have problems with loss of bowel control? N -  Managing  your Medications? N -  Managing your Finances? N -  Housekeeping or managing your Housekeeping? N -  Some recent data might be hidden    Patient Care Team: [name], FNP as PCP - General (Nurse Practitioner)  Indicate any recent Medical Services you may have received from other than [facility] providers in the past year (date may be approximate).     Assessment:   This is a routine wellness examination for Francisco [name].  Hearing/Vision screen Hearing Screening - Comments:: Denies hearing difficulties  Vision Screening - Comments:: Wears glasses - up to date with annual eye exams with Dr Marin Comment in [place] issues and exercise activities discussed: Current Exercise Habits: The patient does not participate in regular exercise at present, Type of exercise: walking, Time (Minutes): 10, Frequency (Times/Week): 7, Weekly Exercise (Minutes/Week): 70, Intensity: Mild, Exercise limited by: orthopedic condition(s);respiratory conditions(s)   Goals Addressed             This Visit's Progress    Exercise 150 minutes per week (moderate activity)   Not on track      Depression Screen [facility] 2/9 Scores 08/08/2021 05/12/2021 02/04/2021  11/05/2020 07/29/2020 04/08/2020 10/31/2019  PHQ - 2 Score 2 3 0 0 0 0 0  PHQ- 9 Score 9 11 - - - - -    Fall Risk Fall Risk  08/08/2021 05/12/2021 02/04/2021 11/05/2020 07/29/2020  Falls in the past year? 1 1 1  0 0  Number falls in past yr: 1 1 0 - -  Injury with Fall? 0 0 0 - -  Comment - - - - -  Risk for fall due to : History of fall(s);Impaired balance/gait;Orthopedic patient History of fall(s) History of fall(s) - -  Risk for fall due to: Comment - - - - -  Follow up Falls prevention discussed;Education provided Education provided Education provided - -    FALL RISK PREVENTION PERTAINING TO THE HOME:  Any stairs in or around the home? No  If so, are there any without handrails? No  Home free of loose throw rugs in walkways, pet beds, electrical cords, etc? Yes  Adequate lighting in your home to reduce risk of falls? Yes   ASSISTIVE DEVICES UTILIZED TO PREVENT FALLS:  Life alert? No  Use of a cane, walker or w/c? No  Grab bars in the bathroom? Yes  Shower chair or bench in shower? Yes  Elevated toilet seat or a handicapped toilet? Yes   TIMED UP AND GO:  Was the test performed? No . Telephonic visit  Cognitive Function: MMSE - Mini Mental State Exam 09/14/2017  Orientation to time 3  Orientation to Place 5  Registration 3  Attention/ Calculation 5  Recall 2  Language- name 2 objects 2  Language- repeat 1  Language- follow 3 step command 3  Language- read & follow direction 1  Write a sentence 1  Copy design 0  Total score 26     6CIT Screen 08/08/2021 09/22/2018  What Year? 0 points 0 points  What month? 0 points 0 points  What time? 0 points 0 points  Count back from 20 0 points 0 points  Months in reverse 2 points 2 points  Repeat phrase 2 points 0 points  Total Score 4 2    Immunizations Immunization History  Administered Date(s) Administered   Fluad Quad(high Dose 65+) 08/01/2019, 09/12/2020   Influenza, High Dose Seasonal PF 09/10/2016, 09/02/2018    Influenza,inj,Quad PF,6+ Mos 08/28/2013, 09/03/2014, 08/09/2017  Influenza-Unspecified 12/03/2015   Moderna SARS-COV2 Booster Vaccination 10/16/2020   Moderna Sars-Covid-2 Vaccination 12/09/2019, 01/06/2020   Pneumococcal Conjugate-13 10/18/2013   Pneumococcal Polysaccharide-23 06/16/2015   Tdap 08/05/2016    TDAP status: Up to date  Flu Vaccine status: Due, Education has been provided regarding the importance of this vaccine. Advised may receive this vaccine at local pharmacy or Health Dept. Aware to provide a copy of the vaccination record if obtained from local pharmacy or Health Dept. Verbalized acceptance and understanding.  Pneumococcal vaccine status: Up to date  Covid-19 vaccine status: Completed vaccines  Qualifies for Shingles Vaccine? Yes   Zostavax completed Yes   Shingrix Completed?: No.    Education has been provided regarding the importance of this vaccine. Patient has been advised to call insurance company to determine out of pocket expense if they have not yet received this vaccine. Advised may also receive vaccine at local pharmacy or Health Dept. Verbalized acceptance and understanding.  Screening Tests Health Maintenance  Topic Date Due   COVID-19 Vaccine (4 - Booster for Moderna series) 02/14/2021   INFLUENZA VACCINE  06/16/2021   Zoster Vaccines- Shingrix (1 of 2) 08/12/2021 (Originally [birth year]1991)   TETANUS/TDAP  08/05/2026   HPV VACCINES  Aged Out    Health Maintenance  Health Maintenance Due  Topic Date Due   COVID-19 Vaccine (4 - Booster for Moderna series) 02/14/2021   INFLUENZA VACCINE  06/16/2021    Colorectal cancer screening: No longer required.   Lung Cancer Screening: (Low Dose CT Chest recommended if Age 66-80 years, 30 pack-year currently smoking OR have quit w/in 15years.) does not qualify.  Additional Screening:  Hepatitis C Screening: does not qualify  Vision Screening: Recommended annual ophthalmology exams for early detection of  glaucoma and other disorders of the eye. Is the patient up to date with their annual eye exam?  Yes  Who is the provider or what is the name of the office in which the patient attends annual eye exams? [facility] If pt is not established with a provider, would they like to be referred to a provider to establish care? No .   Dental Screening: Recommended annual dental exams for proper oral hygiene  Community Resource Referral / Chronic Care Management: CRR required this visit?  No   CCM required this visit?  No      Plan:     I have personally reviewed and noted the following in the patient's chart:   Medical and social history Use of alcohol, tobacco or illicit drugs  Current medications and supplements including opioid prescriptions. Patient is not currently taking opioid prescriptions. Functional ability and status Nutritional status Physical activity Advanced directives List of other physicians Hospitalizations, surgeries, and ER visits in previous 12 months Vitals Screenings to include cognitive, depression, and falls Referrals and appointments  In addition, I have reviewed and discussed with patient certain preventive protocols, quality metrics, and best practice recommendations. A written personalized care plan for preventive services as well as general preventive health recommendations were provided to patient.     Sandrea Hammond, LPN   6/83/[id number]   Nurse Notes: None

## 2021-08-15 DIAGNOSIS — Z23 Encounter for immunization: Secondary | ICD-10-CM | POA: Diagnosis not present

## 2021-08-15 DIAGNOSIS — Z89022 Acquired absence of left finger(s): Secondary | ICD-10-CM | POA: Diagnosis not present

## 2021-08-15 DIAGNOSIS — S61412A Laceration without foreign body of left hand, initial encounter: Secondary | ICD-10-CM | POA: Diagnosis not present

## 2021-08-15 DIAGNOSIS — Z89422 Acquired absence of other left toe(s): Secondary | ICD-10-CM | POA: Diagnosis not present

## 2021-08-15 DIAGNOSIS — M19042 Primary osteoarthritis, left hand: Secondary | ICD-10-CM | POA: Diagnosis not present

## 2021-08-15 DIAGNOSIS — W312XXA Contact with powered woodworking and forming machines, initial encounter: Secondary | ICD-10-CM | POA: Diagnosis not present

## 2021-08-15 DIAGNOSIS — I10 Essential (primary) hypertension: Secondary | ICD-10-CM | POA: Diagnosis not present

## 2021-08-17 DIAGNOSIS — S61012D Laceration without foreign body of left thumb without damage to nail, subsequent encounter: Secondary | ICD-10-CM | POA: Diagnosis not present

## 2021-08-17 DIAGNOSIS — X58XXXD Exposure to other specified factors, subsequent encounter: Secondary | ICD-10-CM | POA: Diagnosis not present

## 2021-08-27 ENCOUNTER — Telehealth: Payer: Self-pay | Admitting: Nurse Practitioner

## 2021-08-28 ENCOUNTER — Other Ambulatory Visit: Payer: Self-pay | Admitting: Nurse Practitioner

## 2021-08-28 DIAGNOSIS — F411 Generalized anxiety disorder: Secondary | ICD-10-CM

## 2021-08-28 MED ORDER — ALPRAZOLAM 1 MG PO TABS: 1.0000 mg | ORAL_TABLET | Freq: Two times a day (BID) | ORAL | 3 refills | Status: DC | PRN

## 2021-08-28 NOTE — Telephone Encounter (Signed)
Patient aware and verbalizes understanding. 

## 2021-08-28 NOTE — Telephone Encounter (Signed)
Xanax refilled.  

## 2021-09-01 ENCOUNTER — Telehealth: Payer: Self-pay

## 2021-09-01 NOTE — Telephone Encounter (Signed)
Patient came in to office stating that alprazolam was sent [facility] and once they arrived to the [place] post office, the post office contacted patient and told him that his package had been damaged and nothing was inside. Patient went to post office to verify and they gave him confirmation that package was damaged and empty. He showed [place] the paper work. Spoke with MMM and she sent the rx to [facility]. I contacted [facility] and cancelled the current rx and any further refills. Patient can go to [place] in the AM to pick up the rx. Patient notified and verbalized understanding

## 2021-09-04 ENCOUNTER — Other Ambulatory Visit: Payer: Self-pay

## 2021-09-04 ENCOUNTER — Ambulatory Visit (INDEPENDENT_AMBULATORY_CARE_PROVIDER_SITE_OTHER): Payer: Medicare Other | Admitting: *Deleted

## 2021-09-04 DIAGNOSIS — Z23 Encounter for immunization: Secondary | ICD-10-CM

## 2021-09-08 ENCOUNTER — Ambulatory Visit: Payer: Medicare Other | Admitting: Nurse Practitioner

## 2021-10-10 ENCOUNTER — Other Ambulatory Visit: Payer: Self-pay | Admitting: Nurse Practitioner

## 2021-10-10 DIAGNOSIS — F411 Generalized anxiety disorder: Secondary | ICD-10-CM

## 2021-10-10 DIAGNOSIS — I10 Essential (primary) hypertension: Secondary | ICD-10-CM

## 2021-10-10 DIAGNOSIS — E782 Mixed hyperlipidemia: Secondary | ICD-10-CM

## 2021-10-21 DIAGNOSIS — H40033 Anatomical narrow angle, bilateral: Secondary | ICD-10-CM | POA: Diagnosis not present

## 2021-10-21 DIAGNOSIS — H2513 Age-related nuclear cataract, bilateral: Secondary | ICD-10-CM | POA: Diagnosis not present

## 2021-11-11 ENCOUNTER — Ambulatory Visit: Payer: Self-pay | Admitting: Nurse Practitioner

## 2021-11-11 ENCOUNTER — Encounter: Payer: Self-pay | Admitting: Nurse Practitioner

## 2021-11-11 NOTE — Progress Notes (Deleted)
Subjective:    Patient ID: Francisco [name], male    DOB: Jan 30, 1941, 80 y.o.   MRN: [id number]  Chief Complaint: medical management of chronic issues     HPI:  Francisco [name] is a 80 y.o. who identifies as a male who was assigned male at birth.   Social history: Lives with: by himmself Work history: retired   Scientist, forensic in today for follow up of the following chronic medical issues:  1. Primary hypertension No c/o chest pain, sob or headche. Does not check blood pressure at home. BP Readings from Last 3 Encounters:  05/12/21 115/75  02/04/21 140/81  11/05/20 120/77     2. Mixed hyperlipidemia Does not really watch diet and does not do much exercise. Lab Results  Component Value Date   CHOL 150 05/12/2021   HDL 39 (L) 05/12/2021   LDLCALC 78 05/12/2021   TRIG 195 (H) 05/12/2021   CHOLHDL 3.8 05/12/2021     3. Gastroesophageal reflux disease without esophagitis Is on protonix dialy and si doing well.  4. Benign prostatic hyperplasia, unspecified whether lower urinary tract symptoms present Deneis any problems voiding and takes flomax daily.  5. GAD (generalized anxiety disorder) Has been anxious every since his wife passed away 6-7 year sago. Marland Kitchen He is on  a combination of xanax BID and lexapro daily.  6. BMI 31.0-31.9,adult No recent weight changes   New complaints: ***  Allergies  Allergen Reactions   Crestor [Rosuvastatin]    Lipitor [Atorvastatin]    Penicillins     REACTION: swelling/hives Has patient had a PCN reaction causing immediate rash, facial/tongue/throat swelling, SOB or lightheadedness with hypotension:yes Has patient had a PCN reaction causing severe rash involving mucus membranes or skin necrosis: Yes Has patient had a PCN reaction that required hospitalization No Has patient had a PCN reaction occurring within the last 10 years: No If all of the above answers are "NO", then may proceed with Cephalosporin use.    Symbicort  [Budesonide-Formoterol Fumarate]     Pain  Behind ribs   Outpatient Encounter Medications as of 11/11/2021  Medication Sig   ALPRAZolam (XANAX) 1 MG tablet Take 1 tablet (1 mg total) by mouth 2 (two) times daily as needed.   aspirin 81 MG tablet Take 81 mg by mouth daily.   brimonidine (ALPHAGAN) 0.2 % ophthalmic solution brimonidine 0.2 % eye drops   escitalopram (LEXAPRO) 10 MG tablet TAKE 2 TABLETS BY MOUTH  DAILY   fish oil-omega-3 fatty acids 1000 MG capsule Take 2 g by mouth daily.   furosemide (LASIX) 20 MG tablet TAKE 1 TABLET BY MOUTH  DAILY   meclizine (ANTIVERT) 25 MG tablet TAKE 1 TABLET BY MOUTH 3  TIMES DAILY AS NEEDED FOR  DIZZINESS   meloxicam (MOBIC) 7.5 MG tablet TAKE 1 TABLET BY MOUTH  DAILY   pantoprazole (PROTONIX) 20 MG tablet Take 20 mg by mouth daily.   simvastatin (ZOCOR) 40 MG tablet TAKE 1 TABLET BY MOUTH AT  BEDTIME   tamsulosin (FLOMAX) 0.4 MG CAPS capsule Take 1 capsule (0.4 mg total) by mouth daily.   No facility-administered encounter medications on file as of 11/11/2021.    Past Surgical History:  Procedure Laterality Date   AMPUTATION Left 08/05/2016   Procedure: REVISION AMPUTATION LEFT INDEX FINGER REPAIR LACERATION LEFT THUMB;  Surgeon: [name], MD;  Location: [facility];  Service: Orthopedics;  Laterality: Left;   SPINE SURGERY      Family History  Problem Relation  Age of Onset   Healthy Mother    Transient ischemic attack Father    Rheum arthritis Daughter    Amblyopia Neg Hx    Blindness Neg Hx    Cataracts Neg Hx    Glaucoma Neg Hx    Macular degeneration Neg Hx    Retinal detachment Neg Hx    Strabismus Neg Hx    Retinitis pigmentosa Neg Hx       Controlled substance contract: ***     Review of Systems     Objective:   Physical Exam        Assessment & Plan:

## 2021-11-21 ENCOUNTER — Ambulatory Visit (INDEPENDENT_AMBULATORY_CARE_PROVIDER_SITE_OTHER): Payer: Medicare Other | Admitting: Nurse Practitioner

## 2021-11-21 ENCOUNTER — Encounter: Payer: Self-pay | Admitting: Nurse Practitioner

## 2021-11-21 VITALS — BP 106/64 | HR 67 | Temp 97.5°F | Resp 20 | Ht 69.0 in | Wt 199.0 lb

## 2021-11-21 DIAGNOSIS — E782 Mixed hyperlipidemia: Secondary | ICD-10-CM | POA: Diagnosis not present

## 2021-11-21 DIAGNOSIS — K219 Gastro-esophageal reflux disease without esophagitis: Secondary | ICD-10-CM | POA: Diagnosis not present

## 2021-11-21 DIAGNOSIS — D229 Melanocytic nevi, unspecified: Secondary | ICD-10-CM | POA: Diagnosis not present

## 2021-11-21 DIAGNOSIS — Z6831 Body mass index (BMI) 31.0-31.9, adult: Secondary | ICD-10-CM

## 2021-11-21 DIAGNOSIS — I10 Essential (primary) hypertension: Secondary | ICD-10-CM

## 2021-11-21 DIAGNOSIS — N400 Enlarged prostate without lower urinary tract symptoms: Secondary | ICD-10-CM

## 2021-11-21 DIAGNOSIS — F411 Generalized anxiety disorder: Secondary | ICD-10-CM

## 2021-11-21 DIAGNOSIS — R079 Chest pain, unspecified: Secondary | ICD-10-CM

## 2021-11-21 MED ORDER — TAMSULOSIN HCL 0.4 MG PO CAPS: 0.4000 mg | ORAL_CAPSULE | Freq: Every day | ORAL | 1 refills | Status: DC

## 2021-11-21 MED ORDER — ESCITALOPRAM OXALATE 10 MG PO TABS: 20.0000 mg | ORAL_TABLET | Freq: Every day | ORAL | 1 refills | Status: DC

## 2021-11-21 MED ORDER — FUROSEMIDE 20 MG PO TABS: 20.0000 mg | ORAL_TABLET | Freq: Every day | ORAL | 1 refills | Status: DC

## 2021-11-21 MED ORDER — SIMVASTATIN 40 MG PO TABS: 40.0000 mg | ORAL_TABLET | Freq: Every day | ORAL | 1 refills | Status: DC

## 2021-11-21 MED ORDER — ALPRAZOLAM 1 MG PO TABS: 1.0000 mg | ORAL_TABLET | Freq: Two times a day (BID) | ORAL | 1 refills | Status: DC | PRN

## 2021-11-21 MED ORDER — PANTOPRAZOLE SODIUM 20 MG PO TBEC: 20.0000 mg | DELAYED_RELEASE_TABLET | Freq: Every day | ORAL | 1 refills | Status: DC

## 2021-11-21 NOTE — Patient Instructions (Signed)
Nonspecific Chest Pain Chest pain can be caused by many different conditions. Some causes of chest pain can be life-threatening. These will require treatment right away. Serious causes of chest pain include: Heart attack. A tear in the body's main blood vessel. Redness and swelling (inflammation) around your heart. Blood clot in your lungs. Other causes of chest pain may not be so serious. These include: Heartburn. Anxiety or stress. Damage to bones or muscles in your chest. Lung infections. Chest pain can feel like: Pain or discomfort in your chest. Crushing, pressure, aching, or squeezing pain. Burning or tingling. Dull or sharp pain that is worse when you move, cough, or take a deep breath. Pain or discomfort that is also felt in your back, neck, jaw, shoulder, or arm, or pain that spreads to any of these areas. It is hard to know whether your pain is caused by something that is serious or something that is not so serious. So it is important to see your doctor right away if you have chest pain. Follow these instructions at home: Medicines Take over-the-counter and prescription medicines only as told by your doctor. If you were prescribed an antibiotic medicine, take it as told by your doctor. Do not stop taking the antibiotic even if you start to feel better. Lifestyle  Rest as told by your doctor. Do not use any products that contain nicotine or tobacco, such as cigarettes, e-cigarettes, and chewing tobacco. If you need help quitting, ask your doctor. Do not drink alcohol. Make lifestyle changes as told by your doctor. These may include: Getting regular exercise. Ask your doctor what activities are safe for you. Eating a heart-healthy diet. A diet and nutrition specialist (dietitian) can help you to learn healthy eating options. Staying at a healthy weight. Treating diabetes or high blood pressure, if needed. Lowering your stress. Activities such as yoga and relaxation techniques  can help. General instructions Pay attention to any changes in your symptoms. Tell your doctor about them or any new symptoms. Avoid any activities that cause chest pain. Keep all follow-up visits as told by your doctor. This is important. You may need more testing if your chest pain does not go away. Contact a doctor if: Your chest pain does not go away. You feel depressed. You have a fever. Get help right away if: Your chest pain is worse. You have a cough that gets worse, or you cough up blood. You have very bad (severe) pain in your belly (abdomen). You pass out (faint). You have either of these for no clear reason: Sudden chest discomfort. Sudden discomfort in your arms, back, neck, or jaw. You have shortness of breath at any time. You suddenly start to sweat, or your skin gets clammy. You feel sick to your stomach (nauseous). You throw up (vomit). You suddenly feel lightheaded or dizzy. You feel very weak or tired. Your heart starts to beat fast, or it feels like it is skipping beats. These symptoms may be an emergency. Do not wait to see if the symptoms will go away. Get medical help right away. Call your local emergency services (911 in the [place] Do not drive yourself to the hospital. Summary Chest pain can be caused by many different conditions. The cause may be serious and need treatment right away. If you have chest pain, see your doctor right away. Follow your doctor's instructions for taking medicines and making lifestyle changes. Keep all follow-up visits as told by your doctor. This includes visits for any further  testing if your chest pain does not go away. Be sure to know the signs that show that your condition has become worse. Get help right away if you have these symptoms. This information is not intended to replace advice given to you by your health care provider. Make sure you discuss any questions you have with your health care provider. Document Revised:  01/16/2021 Document Reviewed: 01/16/2021 Elsevier Patient Education  [employer].

## 2021-11-21 NOTE — Progress Notes (Signed)
Subjective:    Patient ID: Francisco [name], male    DOB: 24-Jun-1941, 81 y.o.   MRN: [id number]   Chief Complaint: Medical Management of Chronic Issues (Mole on neck)    HPI:  Francisco [name] is a 81 y.o. who identifies as a male who was assigned male at birth.   Social history: Lives with: by hisself Work history: retired   Scientist, forensic in today for follow up of the following chronic medical issues:  1. Primary hypertension No c/o chest pain, sob or headache. Does not check blood pressure at home. BP Readings from Last 3 Encounters:  11/21/21 106/64  05/12/21 115/75  02/04/21 140/81     2. Mixed hyperlipidemia Does not really watch diet and does very little exercise. Lab Results  Component Value Date   CHOL 150 05/12/2021   HDL 39 (L) 05/12/2021   LDLCALC 78 05/12/2021   TRIG 195 (H) 05/12/2021   CHOLHDL 3.8 05/12/2021   The ASCVD Risk score (Arnett DK, et al., 2019) failed to calculate for the following reasons:   The 2019 ASCVD risk score is only valid for ages 75 to 85   3. Gastroesophageal reflux disease without esophagitis Is on protonix daly and is doing well.  4. Benign prostatic hyperplasia, unspecified whether lower urinary tract symptoms present Is on flomax and has no problems voiding  5. GAD (generalized anxiety disorder) Is on xanax and is doing well. GAD 7 : Generalized Anxiety Score 11/21/2021 05/12/2021 02/04/2021 04/08/2020  Nervous, Anxious, on Edge 2 1 1  0  Control/stop worrying 2 1 1  0  Worry too much - different things 2 0 1 0  Trouble relaxing 0 0 0 0  Restless 0 0 0 0  Easily annoyed or irritable 0 0 0 0  Afraid - awful might happen 1 1 0 0  Total GAD 7 Score 7 3 3  0  Anxiety Difficulty Somewhat difficult Not difficult at all Not difficult at all -      6. BMI 31.0-31.9,adult Weight is down 3lbs Wt Readings from Last 3 Encounters:  11/21/21 199 lb (90.3 kg)  08/08/21 202 lb (91.6 kg)  05/12/21 202 lb (91.6 kg)   BMI Readings from Last  3 Encounters:  11/21/21 29.39 kg/m  08/08/21 29.83 kg/m  05/12/21 29.83 kg/m      New complaints: -Skin lesions he wants looked at. - chest pain that started several days after a fall. Pain is in the middle of his chest. Intermittent. Certain movements increase pain. Pain described as stabbing- not heavy.  He has not seen his cardiologist in several years. Allergies  Allergen Reactions   Crestor [Rosuvastatin]    Lipitor [Atorvastatin]    Penicillins     REACTION: swelling/hives Has patient had a PCN reaction causing immediate rash, facial/tongue/throat swelling, SOB or lightheadedness with hypotension:yes Has patient had a PCN reaction causing severe rash involving mucus membranes or skin necrosis: Yes Has patient had a PCN reaction that required hospitalization No Has patient had a PCN reaction occurring within the last 10 years: No If all of the above answers are "NO", then may proceed with Cephalosporin use.    Symbicort [Budesonide-Formoterol Fumarate]     Pain  Behind ribs   Outpatient Encounter Medications as of 11/21/2021  Medication Sig   ALPRAZolam (XANAX) 1 MG tablet Take 1 tablet (1 mg total) by mouth 2 (two) times daily as needed.   aspirin 81 MG tablet Take 81 mg by mouth daily.  escitalopram (LEXAPRO) 10 MG tablet TAKE 2 TABLETS BY MOUTH  DAILY   fish oil-omega-3 fatty acids 1000 MG capsule Take 2 g by mouth daily.   furosemide (LASIX) 20 MG tablet TAKE 1 TABLET BY MOUTH  DAILY   meclizine (ANTIVERT) 25 MG tablet TAKE 1 TABLET BY MOUTH 3  TIMES DAILY AS NEEDED FOR  DIZZINESS   meloxicam (MOBIC) 7.5 MG tablet TAKE 1 TABLET BY MOUTH  DAILY   pantoprazole (PROTONIX) 20 MG tablet Take 20 mg by mouth daily.   simvastatin (ZOCOR) 40 MG tablet TAKE 1 TABLET BY MOUTH AT  BEDTIME   tamsulosin (FLOMAX) 0.4 MG CAPS capsule Take 1 capsule (0.4 mg total) by mouth daily.   [DISCONTINUED] brimonidine (ALPHAGAN) 0.2 % ophthalmic solution brimonidine 0.2 % eye drops   No  facility-administered encounter medications on file as of 11/21/2021.    Past Surgical History:  Procedure Laterality Date   AMPUTATION Left 08/05/2016   Procedure: REVISION AMPUTATION LEFT INDEX FINGER REPAIR LACERATION LEFT THUMB;  Surgeon: [name], MD;  Location: [facility];  Service: Orthopedics;  Laterality: Left;   SPINE SURGERY      Family History  Problem Relation Age of Onset   Healthy Mother    Transient ischemic attack Father    Rheum arthritis Daughter    Amblyopia Neg Hx    Blindness Neg Hx    Cataracts Neg Hx    Glaucoma Neg Hx    Macular degeneration Neg Hx    Retinal detachment Neg Hx    Strabismus Neg Hx    Retinitis pigmentosa Neg Hx       Controlled substance contract: 11/21/21     Review of Systems  Constitutional:  Negative for diaphoresis.  Eyes:  Negative for pain.  Respiratory:  Negative for shortness of breath.   Cardiovascular:  Positive for chest pain. Negative for palpitations and leg swelling.  Gastrointestinal:  Negative for abdominal pain.  Endocrine: Negative for polydipsia.  Skin:  Negative for rash.  Neurological:  Negative for dizziness, weakness and headaches.  Hematological:  Does not bruise/bleed easily.  All other systems reviewed and are negative.     Objective:   Physical Exam Vitals and nursing note reviewed.  Constitutional:      Appearance: Normal appearance. He is well-developed.  HENT:     Head: Normocephalic.     Nose: Nose normal.     Mouth/Throat:     Mouth: Mucous membranes are moist.     Pharynx: Oropharynx is clear.  Eyes:     Pupils: Pupils are equal, round, and reactive to light.  Neck:     Thyroid: No thyroid mass or thyromegaly.     Vascular: No carotid bruit or JVD.     Trachea: Phonation normal.  Cardiovascular:     Rate and Rhythm: Normal rate and regular rhythm.  Pulmonary:     Effort: Pulmonary effort is normal. No respiratory distress.     Breath sounds: Normal breath sounds.  Abdominal:      General: Bowel sounds are normal.     Palpations: Abdomen is soft.     Tenderness: There is no abdominal tenderness.  Musculoskeletal:        General: Normal range of motion.     Cervical back: Normal range of motion and neck supple.  Lymphadenopathy:     Cervical: No cervical adenopathy.  Skin:    General: Skin is warm and dry.     Findings: Lesion (brownish scaley lesion on  left clavicle and oneon left cheek.) present.  Neurological:     Mental Status: He is alert and oriented to person, place, and time.  Psychiatric:        Behavior: Behavior normal.        Thought Content: Thought content normal.        Judgment: Judgment normal.    BP 106/64    Pulse 67    Temp (!) 97.5 F (36.4 C) (Temporal)    Resp 20    Ht 5\' 9"  (1.753 m)    Wt 199 lb (90.3 kg)    SpO2 95%    BMI 29.39 kg/m   Ekg- NSR-[name], FNP      Assessment & Plan:  Francisco [name] comes in today with chief complaint of Medical Management of Chronic Issues (Mole on neck)   Diagnosis and orders addressed:  1. Primary hypertension Low sodium diet - furosemide (LASIX) 20 MG tablet; Take 1 tablet (20 mg total) by mouth daily.  Dispense: 90 tablet; Refill: 1  2. Mixed hyperlipidemia Low fat diet - simvastatin (ZOCOR) 40 MG tablet; Take 1 tablet (40 mg total) by mouth at bedtime.  Dispense: 90 tablet; Refill: 1  3. Gastroesophageal reflux disease without esophagitis Avoid spicy foods Do not eat 2 hours prior to bedtime  4. Benign prostatic hyperplasia, unspecified whether lower urinary tract symptoms present Report any problems voiding - tamsulosin (FLOMAX) 0.4 MG CAPS capsule; Take 1 capsule (0.4 mg total) by mouth daily.  Dispense: 90 capsule; Refill: 1  5. GAD (generalized anxiety disorder) Stress management - escitalopram (LEXAPRO) 10 MG tablet; Take 2 tablets (20 mg total) by mouth daily.  Dispense: 180 tablet; Refill: 1 - ALPRAZolam (XANAX) 1 MG tablet; Take 1 tablet (1 mg total) by mouth  2 (two) times daily as needed.  Dispense: 180 tablet; Refill: 1  6. BMI 31.0-31.9,adult Discussed diet and exercise for person with BMI >25 Will recheck weight in 3-6 months   7. Atypical mole - Ambulatory referral to Dermatology  8. Chest pain, unspecified type If develop chest pain that want resolve- go to the ed - EKG 12-Lead - Ambulatory referral to Cardiology   Labs pending Health Maintenance reviewed Diet and exercise encouraged  Follow up plan: 6 months   Brilliant, FNP

## 2021-11-22 LAB — LIPID PANEL
Chol/HDL Ratio: 4 ratio (ref 0.0–5.0)
Cholesterol, Total: 157 mg/dL (ref 100–199)
HDL: 39 mg/dL — ABNORMAL LOW (ref >39)
LDL Chol Calc (NIH): 87 mg/dL (ref 0–99)
Triglycerides: 180 mg/dL — ABNORMAL HIGH (ref 0–149)
VLDL Cholesterol Cal: 31 mg/dL (ref 5–40)

## 2021-11-22 LAB — CBC WITH DIFFERENTIAL/PLATELET
Basophils Absolute: 0 10*3/uL (ref 0.0–0.2)
Basos: 1 %
EOS (ABSOLUTE): 0.1 10*3/uL (ref 0.0–0.4)
Eos: 2 %
Hematocrit: 39 % (ref 37.5–51.0)
Hemoglobin: 13.9 g/dL (ref 13.0–17.7)
Immature Grans (Abs): 0 10*3/uL (ref 0.0–0.1)
Immature Granulocytes: 0 %
Lymphocytes Absolute: 1.6 10*3/uL (ref 0.7–3.1)
Lymphs: 22 %
MCH: 33.5 pg — ABNORMAL HIGH (ref 26.6–33.0)
MCHC: 35.6 g/dL (ref 31.5–35.7)
MCV: 94 fL (ref 79–97)
Monocytes Absolute: 0.5 10*3/uL (ref 0.1–0.9)
Monocytes: 7 %
Neutrophils Absolute: 5.1 10*3/uL (ref 1.4–7.0)
Neutrophils: 68 %
Platelets: 171 10*3/uL (ref 150–450)
RBC: 4.15 x10E6/uL (ref 4.14–5.80)
RDW: 13.2 % (ref 11.6–15.4)
WBC: 7.4 10*3/uL (ref 3.4–10.8)

## 2021-11-22 LAB — CMP14+EGFR
ALT: 9 IU/L (ref 0–44)
AST: 13 IU/L (ref 0–40)
Albumin/Globulin Ratio: 2.4 — ABNORMAL HIGH (ref 1.2–2.2)
Albumin: 4.4 g/dL (ref 3.7–4.7)
Alkaline Phosphatase: 53 IU/L (ref 44–121)
BUN/Creatinine Ratio: 15 (ref 10–24)
BUN: 22 mg/dL (ref 8–27)
Bilirubin Total: 0.6 mg/dL (ref 0.0–1.2)
CO2: 24 mmol/L (ref 20–29)
Calcium: 9.2 mg/dL (ref 8.6–10.2)
Chloride: 104 mmol/L (ref 96–106)
Creatinine, Ser: 1.45 mg/dL — ABNORMAL HIGH (ref 0.76–1.27)
Globulin, Total: 1.8 g/dL (ref 1.5–4.5)
Glucose: 101 mg/dL — ABNORMAL HIGH (ref 70–99)
Potassium: 4.4 mmol/L (ref 3.5–5.2)
Sodium: 141 mmol/L (ref 134–144)
Total Protein: 6.2 g/dL (ref 6.0–8.5)
eGFR: 49 mL/min/{1.73_m2} — ABNORMAL LOW (ref >59)

## 2021-12-10 DIAGNOSIS — F419 Anxiety disorder, unspecified: Secondary | ICD-10-CM | POA: Insufficient documentation

## 2021-12-16 ENCOUNTER — Ambulatory Visit: Payer: Medicare Other | Admitting: Cardiology

## 2021-12-16 ENCOUNTER — Encounter: Payer: Self-pay | Admitting: Cardiology

## 2021-12-16 ENCOUNTER — Other Ambulatory Visit: Payer: Self-pay

## 2021-12-16 VITALS — BP 130/80 | HR 60 | Ht 69.0 in | Wt 198.0 lb

## 2021-12-16 DIAGNOSIS — R079 Chest pain, unspecified: Secondary | ICD-10-CM | POA: Diagnosis not present

## 2021-12-16 DIAGNOSIS — I739 Peripheral vascular disease, unspecified: Secondary | ICD-10-CM

## 2021-12-16 DIAGNOSIS — I10 Essential (primary) hypertension: Secondary | ICD-10-CM | POA: Diagnosis not present

## 2021-12-16 DIAGNOSIS — E782 Mixed hyperlipidemia: Secondary | ICD-10-CM | POA: Diagnosis not present

## 2021-12-16 MED ORDER — METOPROLOL SUCCINATE ER 25 MG PO TB24: 25.0000 mg | ORAL_TABLET | Freq: Every day | ORAL | 2 refills | Status: DC

## 2021-12-16 MED ORDER — SIMVASTATIN 80 MG PO TABS: 80.0000 mg | ORAL_TABLET | Freq: Every day | ORAL | 2 refills | Status: DC

## 2021-12-16 MED ORDER — METOPROLOL SUCCINATE ER 25 MG PO TB24: 25.0000 mg | ORAL_TABLET | Freq: Two times a day (BID) | ORAL | 2 refills | Status: DC

## 2021-12-16 NOTE — Addendum Note (Signed)
Addended by: Darius Bump B on: 12/16/2021 11:25 AM   Modules accepted: Orders

## 2021-12-16 NOTE — Patient Instructions (Signed)
Medication Instructions:  Your physician has recommended you make the following change in your medication:   START: Simvistatin 80 mg daily START: Metoprolol succinate 25 mg twice daily  *If you need a refill on your cardiac medications before your next appointment, please call your pharmacy*   Lab Work: None If you have labs (blood work) drawn today and your tests are completely normal, you will receive your results only by: [place] (if you have MyChart) OR A paper copy in the mail If you have any lab test that is abnormal or we need to change your treatment, we will call you to review the results.   Testing/Procedures: Your physician has requested that you have an echocardiogram. Echocardiography is a painless test that uses sound waves to create images of your heart. It provides your doctor with information about the size and shape of your heart and how well your hearts chambers and valves are working. This procedure takes approximately one hour. There are no restrictions for this procedure.  Your physician has requested that you have a carotid duplex. This test is an ultrasound of the carotid arteries in your neck. It looks at blood flow through these arteries that supply the brain with blood. Allow one hour for this exam. There are no restrictions or special instructions.       Follow-Up: At [facility], you and your health needs are our priority.  As part of our continuing mission to provide you with exceptional heart care, we have created designated Provider Care Teams.  These Care Teams include your primary Cardiologist (physician) and Advanced Practice Providers (APPs -  Physician Assistants and Nurse Practitioners) who all work together to provide you with the care you need, when you need it.  We recommend signing up for the patient portal called "MyChart".  Sign up information is provided on this After Visit Summary.  MyChart is used to connect with patients for  Virtual Visits (Telemedicine).  Patients are able to view lab/test results, encounter notes, upcoming appointments, etc.  Non-urgent messages can be sent to your provider as well.   To learn more about what you can do with MyChart, go to [email or web address].    Your next appointment:   2 month(s)  The format for your next appointment:   In Person  Provider:   Jenne Campus, MD    Other Instructions None

## 2021-12-16 NOTE — Addendum Note (Signed)
Addended by: Edwyna Shell I on: 12/16/2021 11:43 AM   Modules accepted: Orders

## 2021-12-16 NOTE — Progress Notes (Signed)
Cardiology Consultation:    Date:  12/16/2021   ID:  REILY ILIC, DOB [birth year], MRN [id number]  PCP:  [name], FNP  Cardiologist:  Jenne Campus, MD   Referring MD: Hassell Done, Mary-Margaret, *   No chief complaint on file. I have chest pain  History of Present Illness:    Francisco [name] is a 81 y.o. male who is being seen today for the evaluation of chest pain at the request of Hassell Done, Mary-Margaret, *.  Past medical history significant for dyslipidemia, essential hypertension, he is an ex-smoker stopped smoking 1973, he does have peripheral vascular disease in 2020 February he did have carotic ultrasounds which show completely occluded left internal carotic artery and up to 59% stenosis on the opposite side.  He was referred to [place] because he complained of having some atypical chest pain he complained of having numbness in his arm but that typically happens when he sleeps at night I do not think it related to his heart.  He did have 2 neck surgeries shot suspect that is the reason for his arm pain however he also complained of having some tightness in the chest sometimes it happened when he walks around.  He tells me that he walks rather slowly when he goes to do shopping in [place] he moves very slowly when he moved faster he will get some sensation in the chest.  He also complained of having some shortness of breath.  But there is no sweating sensation of the chest last for few minutes.  He grades this 3 in scale up to 10. He does not exercise on the regular basis, he is not on any special diet. He does have family of coronary artery disease but not premature. He quit smoking in 70s.  Past Medical History:  Diagnosis Date   Anxiety    GERD (gastroesophageal reflux disease)    Hypertension     Past Surgical History:  Procedure Laterality Date   AMPUTATION Left 08/05/2016   Procedure: REVISION AMPUTATION LEFT INDEX FINGER REPAIR LACERATION LEFT THUMB;  Surgeon: [name], MD;  Location: [place];  Service: Orthopedics;  Laterality: Left;   SPINE SURGERY      Current Medications: Current Meds  Medication Sig   ALPRAZolam (XANAX) 1 MG tablet Take 1 tablet (1 mg total) by mouth 2 (two) times daily as needed.   aspirin 81 MG tablet Take 81 mg by mouth daily.   escitalopram (LEXAPRO) 10 MG tablet Take 2 tablets (20 mg total) by mouth daily.   fish oil-omega-3 fatty acids 1000 MG capsule Take 2 g by mouth daily.   furosemide (LASIX) 20 MG tablet Take 1 tablet (20 mg total) by mouth daily.   meclizine (ANTIVERT) 25 MG tablet TAKE 1 TABLET BY MOUTH 3  TIMES DAILY AS NEEDED FOR  DIZZINESS   meloxicam (MOBIC) 7.5 MG tablet TAKE 1 TABLET BY MOUTH  DAILY   pantoprazole (PROTONIX) 20 MG tablet Take 1 tablet (20 mg total) by mouth daily.   simvastatin (ZOCOR) 40 MG tablet Take 1 tablet (40 mg total) by mouth at bedtime.   tamsulosin (FLOMAX) 0.4 MG CAPS capsule Take 1 capsule (0.4 mg total) by mouth daily.     Allergies:   Crestor [rosuvastatin], Lipitor [atorvastatin], Penicillins, and Symbicort [budesonide-formoterol fumarate]   Social History   Socioeconomic History   Marital status: Widowed    Spouse name: Not on file   Number of children: 2   Years of education: Not  on file   Highest education level: Not on file  Occupational History   Occupation: Retired    Fish farm manager: [employer]  Tobacco Use   Smoking status: Former    Types: Cigarettes    Quit date: 02/23/1972    Years since quitting: 49.8   Smokeless tobacco: Never  Vaping Use   Vaping Use: Never used  Substance and Sexual Activity   Alcohol use: Yes    Alcohol/week: 1.0 standard drink    Types: 1 Standard drinks or equivalent per week    Comment: occasional beer   Drug use: No   Sexual activity: Not Currently  Other Topics Concern   Not on file  Social History Narrative   2 daughters nearby   Social Determinants of Health   Financial Resource Strain: Low Risk    Difficulty of  Paying Living Expenses: Not hard at all  Food Insecurity: No Food Insecurity   Worried About Charity fundraiser in the Last Year: Never true   Arboriculturist in the Last Year: Never true  Transportation Needs: No Transportation Needs   Lack of Transportation (Medical): No   Lack of Transportation (Non-Medical): No  Physical Activity: Inactive   Days of Exercise per Week: 0 days   Minutes of Exercise per Session: 0 min  Stress: No Stress Concern Present   Feeling of Stress : Not at all  Social Connections: Socially Isolated   Frequency of Communication with Friends and Family: More than three times a week   Frequency of Social Gatherings with Friends and Family: More than three times a week   Attends Religious Services: Never   Marine scientist or Organizations: No   Attends Archivist Meetings: Never   Marital Status: Widowed     Family History: The patient's family history includes Healthy in his mother; Rheum arthritis in his daughter; Transient ischemic attack in his father. There is no history of Amblyopia, Blindness, Cataracts, Glaucoma, Macular degeneration, Retinal detachment, Strabismus, or Retinitis pigmentosa. ROS:   Please see the history of present illness.    All 14 point review of systems negative except as described per history of present illness.  EKGs/Labs/Other Studies Reviewed:    The following studies were reviewed today: Carotic ultrasound reviewed showing completely occluded artery on the left with 40 to 59% stenosis on the right that was done in 2020  EKG:  EKG is  ordered today.  The ekg ordered today demonstrates normal sinus rhythm normal P interval nonspecific ST segment changes.  Recent Labs: 11/21/2021: ALT 9; BUN 22; Creatinine, Ser 1.45; Hemoglobin 13.9; Platelets 171; Potassium 4.4; Sodium 141  Recent Lipid Panel    Component Value Date/Time   CHOL 157 11/21/2021 1225   CHOL 113 04/12/2013 1145   TRIG 180 (H) 11/21/2021 1225    TRIG 240 (H) 04/04/2015 1438   TRIG 85 04/12/2013 1145   HDL 39 (L) 11/21/2021 1225   HDL 40 04/04/2015 1438   HDL 51 04/12/2013 1145   CHOLHDL 4.0 11/21/2021 1225   LDLCALC 87 11/21/2021 1225   LDLCALC 77 05/02/2014 0000   LDLCALC 45 04/12/2013 1145    Physical Exam:    VS:  BP 130/80 (BP Location: Right Arm)    Pulse 60    Ht 5\' 9"  (1.753 m)    Wt 198 lb (89.8 kg)    SpO2 96%    BMI 29.24 kg/m     Wt Readings from Last 3 Encounters:  12/16/21 198 lb (89.8 kg)  11/21/21 199 lb (90.3 kg)  08/08/21 202 lb (91.6 kg)     GEN:  Well nourished, well developed in no acute distress HEENT: Normal NECK: No JVD; No carotid bruits LYMPHATICS: No lymphadenopathy CARDIAC: RRR, no murmurs, no rubs, no gallops RESPIRATORY:  Clear to auscultation without rales, wheezing or rhonchi  ABDOMEN: Soft, non-tender, non-distended MUSCULOSKELETAL:  No edema; No deformity  SKIN: Warm and dry NEUROLOGIC:  Alert and oriented x 3 PSYCHIATRIC:  Normal affect   ASSESSMENT:    1. Primary hypertension   2. 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 Chest pain of uncertain etiology   4. Mixed hyperlipidemia    PLAN:    In order of problems listed above:  Chest pain which is suspicious for coronary artery disease on top of that he already does have documented peripheral vascular disease which make coronary artery disease more likely.  He need to be evaluated for coronary artery disease how we do not do it will depends on carotic ultrasound.  In the meantime ask you to keep taking aspirin, I will double the dose of simvastatin.  I will give him prescription for metoprolol 25 twice daily. 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  Carotic ultrasounds will be performed to see the degree of narrowing on the opposite side.  Based on that we will decide which way to proceed to evaluate his coronary artery disease I suspect stress test  will be most reasonable.  I am afraid if we do coronary CT angio will find a lot of calcified arteries which will be difficult to judge. Dyslipidemia I did review his K PN which show me his LDL of 87.  Not sufficiently controlling his LDL need to be less than 70 I will double the dose of simvastatin. Essential hypertension, blood pressure seems to be recently decently controlled continue present management   Medication Adjustments/Labs and Tests Ordered: Current medicines are reviewed at length with the patient today.  Concerns regarding medicines are outlined above.  No orders of the defined types were placed in this encounter.  No orders of the defined types were placed in this encounter.   Signed, Park Liter, MD, [facility]. 12/16/2021 11:16 AM    [place]

## 2021-12-17 ENCOUNTER — Telehealth: Payer: Self-pay

## 2021-12-17 ENCOUNTER — Ambulatory Visit (HOSPITAL_BASED_OUTPATIENT_CLINIC_OR_DEPARTMENT_OTHER)
Admission: RE | Admit: 2021-12-17 | Discharge: 2021-12-17 | Disposition: A | Discharge status: Home / Self Care | Payer: Medicare Other | Source: Ambulatory Visit | Attending: Cardiology | Admitting: Cardiology

## 2021-12-17 DIAGNOSIS — E782 Mixed hyperlipidemia: Secondary | ICD-10-CM | POA: Diagnosis not present

## 2021-12-17 DIAGNOSIS — I739 Peripheral vascular disease, unspecified: Secondary | ICD-10-CM | POA: Insufficient documentation

## 2021-12-17 DIAGNOSIS — R079 Chest pain, unspecified: Secondary | ICD-10-CM | POA: Insufficient documentation

## 2021-12-17 DIAGNOSIS — R0609 Other forms of dyspnea: Secondary | ICD-10-CM

## 2021-12-17 DIAGNOSIS — I10 Essential (primary) hypertension: Secondary | ICD-10-CM | POA: Diagnosis not present

## 2021-12-17 LAB — ECHOCARDIOGRAM COMPLETE
AR max vel: 2.88 cm2
AV Area VTI: 3.07 cm2
AV Area mean vel: 2.95 cm2
AV Mean grad: 4 mmHg
AV Peak grad: 8.4 mmHg
Ao pk vel: 1.45 m/s
Area-P 1/2: 2.62 cm2
S' Lateral: 3.2 cm

## 2021-12-17 NOTE — Telephone Encounter (Signed)
[facility] sent a letter requesting to clarify instruction on Metoprolol Succ. Per Dr. Raliegh Ip notes on pt last visit, he increase Metoprolol Succ to 25 mg bid. Instruction provided to [facility] pharmacist at [facility].

## 2021-12-17 NOTE — Progress Notes (Signed)
°  Echocardiogram 2D Echocardiogram has been performed.  Francisco [name] F 12/17/2021, 5:19 PM

## 2021-12-19 ENCOUNTER — Other Ambulatory Visit: Payer: Self-pay

## 2021-12-19 ENCOUNTER — Ambulatory Visit (HOSPITAL_BASED_OUTPATIENT_CLINIC_OR_DEPARTMENT_OTHER)
Admission: RE | Admit: 2021-12-19 | Discharge: 2021-12-19 | Disposition: A | Discharge status: Home / Self Care | Payer: Medicare Other | Source: Ambulatory Visit | Attending: Cardiology | Admitting: Cardiology

## 2021-12-19 DIAGNOSIS — I739 Peripheral vascular disease, unspecified: Secondary | ICD-10-CM | POA: Insufficient documentation

## 2021-12-19 DIAGNOSIS — E782 Mixed hyperlipidemia: Secondary | ICD-10-CM

## 2021-12-19 DIAGNOSIS — R079 Chest pain, unspecified: Secondary | ICD-10-CM | POA: Diagnosis not present

## 2021-12-19 DIAGNOSIS — I10 Essential (primary) hypertension: Secondary | ICD-10-CM | POA: Insufficient documentation

## 2021-12-19 DIAGNOSIS — I6522 Occlusion and stenosis of left carotid artery: Secondary | ICD-10-CM | POA: Diagnosis not present

## 2021-12-22 ENCOUNTER — Telehealth: Payer: Self-pay | Admitting: Cardiology

## 2021-12-22 NOTE — Telephone Encounter (Signed)
Follow Up:    Patient is calling to see if his  Ultrasound results are ready please.

## 2021-12-22 NOTE — Telephone Encounter (Signed)
Results reviewed with pt as per Dr. Krasowski's note.  Pt verbalized understanding and had no additional questions. Routed to PCP  

## 2021-12-31 ENCOUNTER — Telehealth: Payer: Self-pay

## 2021-12-31 NOTE — Telephone Encounter (Signed)
Medication clarification request for Zocor 80 mg  sent back to [facility]

## 2022-02-14 ENCOUNTER — Emergency Department (HOSPITAL_COMMUNITY): Payer: Medicare Other

## 2022-02-14 ENCOUNTER — Encounter (HOSPITAL_COMMUNITY): Payer: Self-pay | Admitting: Emergency Medicine

## 2022-02-14 ENCOUNTER — Emergency Department (HOSPITAL_COMMUNITY)
Admission: EM | Admit: 2022-02-14 | Discharge: 2022-02-14 | Disposition: A | Discharge status: Home / Self Care | Payer: Medicare Other | Attending: Emergency Medicine | Admitting: Emergency Medicine

## 2022-02-14 ENCOUNTER — Other Ambulatory Visit: Payer: Self-pay

## 2022-02-14 DIAGNOSIS — R0781 Pleurodynia: Secondary | ICD-10-CM | POA: Insufficient documentation

## 2022-02-14 DIAGNOSIS — R091 Pleurisy: Secondary | ICD-10-CM | POA: Diagnosis not present

## 2022-02-14 DIAGNOSIS — R1011 Right upper quadrant pain: Secondary | ICD-10-CM | POA: Insufficient documentation

## 2022-02-14 DIAGNOSIS — J40 Bronchitis, not specified as acute or chronic: Secondary | ICD-10-CM

## 2022-02-14 DIAGNOSIS — R109 Unspecified abdominal pain: Secondary | ICD-10-CM | POA: Diagnosis not present

## 2022-02-14 DIAGNOSIS — K8020 Calculus of gallbladder without cholecystitis without obstruction: Secondary | ICD-10-CM | POA: Diagnosis not present

## 2022-02-14 DIAGNOSIS — Z7982 Long term (current) use of aspirin: Secondary | ICD-10-CM | POA: Diagnosis not present

## 2022-02-14 DIAGNOSIS — R059 Cough, unspecified: Secondary | ICD-10-CM | POA: Diagnosis not present

## 2022-02-14 DIAGNOSIS — R9431 Abnormal electrocardiogram [ECG] [EKG]: Secondary | ICD-10-CM | POA: Diagnosis not present

## 2022-02-14 DIAGNOSIS — R1010 Upper abdominal pain, unspecified: Secondary | ICD-10-CM | POA: Diagnosis not present

## 2022-02-14 DIAGNOSIS — R0602 Shortness of breath: Secondary | ICD-10-CM | POA: Diagnosis not present

## 2022-02-14 LAB — CBC
HCT: 37.3 % — ABNORMAL LOW (ref 39.0–52.0)
Hemoglobin: 12.9 g/dL — ABNORMAL LOW (ref 13.0–17.0)
MCH: 33.6 pg (ref 26.0–34.0)
MCHC: 34.6 g/dL (ref 30.0–36.0)
MCV: 97.1 fL (ref 80.0–100.0)
Platelets: 144 10*3/uL — ABNORMAL LOW (ref 150–400)
RBC: 3.84 MIL/uL — ABNORMAL LOW (ref 4.22–5.81)
RDW: 13.2 % (ref 11.5–15.5)
WBC: 8.5 10*3/uL (ref 4.0–10.5)
nRBC: 0 % (ref 0.0–0.2)

## 2022-02-14 LAB — COMPREHENSIVE METABOLIC PANEL
ALT: 12 U/L (ref 0–44)
AST: 14 U/L — ABNORMAL LOW (ref 15–41)
Albumin: 3.9 g/dL (ref 3.5–5.0)
Alkaline Phosphatase: 45 U/L (ref 38–126)
Anion gap: 10 (ref 5–15)
BUN: 24 mg/dL — ABNORMAL HIGH (ref 8–23)
CO2: 25 mmol/L (ref 22–32)
Calcium: 9.2 mg/dL (ref 8.9–10.3)
Chloride: 102 mmol/L (ref 98–111)
Creatinine, Ser: 1.41 mg/dL — ABNORMAL HIGH (ref 0.61–1.24)
GFR, Estimated: 50 mL/min — ABNORMAL LOW (ref >60)
Glucose, Bld: 158 mg/dL — ABNORMAL HIGH (ref 70–99)
Potassium: 4.3 mmol/L (ref 3.5–5.1)
Sodium: 137 mmol/L (ref 135–145)
Total Bilirubin: 0.8 mg/dL (ref 0.3–1.2)
Total Protein: 6.4 g/dL — ABNORMAL LOW (ref 6.5–8.1)

## 2022-02-14 LAB — URINALYSIS, ROUTINE W REFLEX MICROSCOPIC
Bilirubin Urine: NEGATIVE
Glucose, UA: NEGATIVE mg/dL
Hgb urine dipstick: NEGATIVE
Ketones, ur: NEGATIVE mg/dL
Leukocytes,Ua: NEGATIVE
Nitrite: NEGATIVE
Protein, ur: NEGATIVE mg/dL
Specific Gravity, Urine: 1.013 (ref 1.005–1.030)
pH: 5 (ref 5.0–8.0)

## 2022-02-14 LAB — LIPASE, BLOOD: Lipase: 39 U/L (ref 11–51)

## 2022-02-14 MED ORDER — DOXYCYCLINE HYCLATE 100 MG PO CAPS
100.0000 mg | ORAL_CAPSULE | Freq: Two times a day (BID) | ORAL | 0 refills | Status: DC
Start: 2022-02-14 — End: 2022-03-04

## 2022-02-14 MED ORDER — HYDROCODONE-ACETAMINOPHEN 5-325 MG PO TABS: 1.0000 | ORAL_TABLET | Freq: Four times a day (QID) | ORAL | 0 refills | Status: DC | PRN

## 2022-02-14 MED ORDER — DOXYCYCLINE HYCLATE 100 MG PO TABS
100.0000 mg | ORAL_TABLET | Freq: Once | ORAL | Status: AC
  Administered 2022-02-14: 100 mg via ORAL
  Filled 2022-02-14: qty 1

## 2022-02-14 MED ORDER — DOXYCYCLINE HYCLATE 100 MG PO CAPS: 100.0000 mg | ORAL_CAPSULE | Freq: Two times a day (BID) | ORAL | 0 refills | Status: DC

## 2022-02-14 MED ORDER — HYDROCODONE-ACETAMINOPHEN 5-325 MG PO TABS
1.0000 | ORAL_TABLET | Freq: Once | ORAL | Status: AC
  Administered 2022-02-14: 1 via ORAL
  Filled 2022-02-14: qty 1

## 2022-02-14 NOTE — ED Triage Notes (Signed)
Patient reports R upper/mid abdominal pain x2-3 days and seems to be getting worse. Denies N/V, reports diarrhea. Denies SOB, reports productive cough w/ white sputum.  ?

## 2022-02-14 NOTE — ED Provider Triage Note (Signed)
Emergency Medicine Provider Triage Evaluation Note ? ?Francisco [name] , a 81 y.o. male  was evaluated in triage.  Pt complains of RUQ abdominal pain that wraps around his right flank. Patient states this issue began around 10 days ago and acutely worsened during the last 3-4 days. Patient has history of abdominal hernia but was told by PCP some time ago that he did not require surgery unless it became bothersome to him. Patient states pain is worsened when coughing. Patient also requesting dermatology referral due to concern for melanoma. ? ?Review of Systems  ?Positive: Abdominal pain ?Negative: Fevers, nausea, vomiting, blood in stool ? ?Physical Exam  ?BP 125/60 (BP Location: Right Arm)   Pulse (!) 53   Temp 97.8 ?F (36.6 ?C) (Oral)   Resp 16   Ht '5\' 9"'$  (1.753 m)   Wt 90.3 kg   SpO2 100%   BMI 29.39 kg/m?  ?Gen:   Awake, no distress   ?Resp:  Normal effort  ?MSK:   Moves extremities without difficulty  ?Other:  Patient has noticeable hernia to RUQ  ? ?Medical Decision Making  ?Medically screening exam initiated at 12:19 PM.  Appropriate orders placed.  Francisco [name] was informed that the remainder of the evaluation will be completed by another provider, this initial triage assessment does not replace that evaluation, and the importance of remaining in the ED until their evaluation is complete. ? ? ?  ?Azucena Cecil, PA-C ?02/14/22 1221 ? ?

## 2022-02-14 NOTE — ED Provider Notes (Signed)
?[place] DEPT ?Provider Note ? ? ?CSN: [id number] ?Arrival date & time: 02/14/22  1148 ? ?  ? ?History ? ?Chief Complaint  ?Patient presents with  ? Abdominal Pain  ? ? ?Francisco [name] is a 81 y.o. male. ? ?HPI ?Pt complains of RUQ abdominal pain that wraps around his right flank. Patient states this issue began around 10 days ago and acutely worsened during the last 3-4 days. Patient has history of abdominal hernia but was told by PCP some time ago that he did not require surgery unless it became bothersome to him. Patient states pain is worsened when coughing.  ? ?Patient denies any chills or fever.  Mildly productive cough started 3 to 4 days ago.  Patient has sharp pain with inspiration right lower chest.  But also some upper right abdominal pain.  No vomiting or diarrhea.  No pain burning urgency with urination.  No pain or swelling in the legs.  Reports a similar episode some 20 years ago when he had pleurisy. ? ?Very distant smoking history.  Patient quit in the 1970s. ? ? ?  ? ?Home Medications ?Prior to Admission medications   ?Medication Sig Start Date End Date Taking? Authorizing Provider  ?ALPRAZolam (XANAX) 1 MG tablet Take 1 tablet (1 mg total) by mouth 2 (two) times daily as needed. 11/21/21   [name], FNP  ?aspirin 81 MG tablet Take 81 mg by mouth daily.    [provider]  ?escitalopram (LEXAPRO) 10 MG tablet Take 2 tablets (20 mg total) by mouth daily. 11/21/21   [name], FNP  ?fish oil-omega-3 fatty acids 1000 MG capsule Take 2 g by mouth daily.    [provider]  ?furosemide (LASIX) 20 MG tablet Take 1 tablet (20 mg total) by mouth daily. 11/21/21   [name], FNP  ?meclizine (ANTIVERT) 25 MG tablet TAKE 1 TABLET BY MOUTH 3  TIMES DAILY AS NEEDED FOR  DIZZINESS 05/12/21   [name], FNP  ?meloxicam (MOBIC) 7.5 MG tablet TAKE 1 TABLET BY MOUTH  DAILY 10/11/21   [name], FNP  ?metoprolol  succinate (TOPROL XL) 25 MG 24 hr tablet Take 1 tablet (25 mg total) by mouth 2 (two) times daily. 12/16/21   Park Liter, MD  ?pantoprazole (PROTONIX) 20 MG tablet Take 1 tablet (20 mg total) by mouth daily. 11/21/21   [name], FNP  ?simvastatin (ZOCOR) 80 MG tablet Take 1 tablet (80 mg total) by mouth daily. 12/16/21   Park Liter, MD  ?tamsulosin (FLOMAX) 0.4 MG CAPS capsule Take 1 capsule (0.4 mg total) by mouth daily. 11/21/21   [name], FNP  ?   ? ?Allergies    ?Crestor [rosuvastatin], Lipitor [atorvastatin], Penicillins, and Symbicort [budesonide-formoterol fumarate]   ? ?Review of Systems   ?Review of Systems ?10 Systems reviewed negative except as per HPI ?Physical Exam ?Updated Vital Signs ?BP 125/60 (BP Location: Right Arm)   Pulse (!) 53   Temp 97.8 ?F (36.6 ?C) (Oral)   Resp 16   Ht '5\' 9"'$  (1.753 m)   Wt 90.3 kg   SpO2 100%   BMI 29.39 kg/m?  ?Physical Exam ?Constitutional:   ?   Comments: Alert nontoxic no respiratory distress.  Intermittent wet cough.  ?HENT:  ?   Mouth/Throat:  ?   Pharynx: Oropharynx is clear.  ?Eyes:  ?   Extraocular Movements: Extraocular movements intact.  ?Cardiovascular:  ?   Rate and Rhythm: Normal rate and regular  rhythm.  ?Pulmonary:  ?   Comments: No respiratory distress.  Expiratory wheeze and crackle in the right lung base to midlung field.  Left lung clear. ?Abdominal:  ?   Comments: Abdomen soft without guarding.  Patient does endorse discomfort to palpation in the right upper quadrant.  No rash or soft tissue changes appreciable.  ?Musculoskeletal:     ?   General: Normal range of motion.  ?   Comments: Trace edema bilateral pretibial.  Calves soft and nontender.  Skin condition good.  ?Neurological:  ?   General: No focal deficit present.  ?   Mental Status: He is oriented to person, place, and time.  ?   Motor: No weakness.  ?   Coordination: Coordination normal.  ?Psychiatric:     ?   Mood and Affect: Mood normal.  ? ? ?ED  Results / Procedures / Treatments   ?Labs ?(all labs ordered are listed, but only abnormal results are displayed) ?Labs Reviewed  ?COMPREHENSIVE METABOLIC PANEL - Abnormal; Notable for the following components:  ?    Result Value  ? Glucose, Bld 158 (*)   ? BUN 24 (*)   ? Creatinine, Ser 1.41 (*)   ? Total Protein 6.4 (*)   ? AST 14 (*)   ? GFR, Estimated 50 (*)   ? All other components within normal limits  ?CBC - Abnormal; Notable for the following components:  ? RBC 3.84 (*)   ? Hemoglobin 12.9 (*)   ? HCT 37.3 (*)   ? Platelets 144 (*)   ? All other components within normal limits  ?LIPASE, BLOOD  ?URINALYSIS, ROUTINE W REFLEX MICROSCOPIC  ? ? ?EKG ?EKG Interpretation ? ?Date/Time:  Saturday February 14 2022 11:57:13 EDT ?Ventricular Rate:  54 ?PR Interval:  145 ?QRS Duration: 88 ?QT Interval:  464 ?QTC Calculation: 440 ?R Axis:   62 ?Text Interpretation: Sinus rhythm No significant change since prior 6/04 Confirmed by Aletta Edouard [id number]) on 02/14/2022 12:31:48 PM ? ?Radiology ?DG Chest 2 View ? ?Result Date: 02/14/2022 ?CLINICAL DATA:  Cough. EXAM: CHEST - 2 VIEW COMPARISON:  04/08/2020 FINDINGS: Heart size is normal. Aortic atherosclerosis. No pleural effusion or edema. No airspace consolidation. Status post ACDF. No acute or suspicious osseous findings. IMPRESSION: No active cardiopulmonary abnormalities. Electronically Signed   By: Kerby Moors M.D.   On: 02/14/2022 12:47   ? ?Procedures ?Procedures  ? ? ?Medications Ordered in ED ?Medications - No data to display ? ?ED Course/ Medical Decision Making/ A&P ?  ?                        ?Medical Decision Making ?Amount and/or Complexity of Data Reviewed ?Labs: ordered. ?Radiology: ordered. ? ?Risk ?Prescription drug management. ? ? ?Patient presents with pleuritic quality chest pain.  He does have a cough for about 3 days.  Positive wheeze and crackle right lung base.  Patient is nontoxic without fever or chills.  Vital signs stable without signs of sepsis.   Proceed with diagnostic evaluation for both reproducible right upper quadrant pain and cough with pleuritic chest pain. ? ?Differential diagnosis includes pneumonia, cholecystitis, PE, pleurisy, atypical ACS.  ? ?EKG and diagnostic studies reviewed by myself.  EKG without ischemic changes.  No leukocytosis, chest x-ray without mediastinal widening. ? ?CT chest visualized and reviewed by myself as well as my review of radiology interpretation.  Gallstones present in the gallbladder without apparent stranding or inflammatory changes.  Lung  bases are grossly clear. ? ?Bedside telemetry observed by myself for consistent sinus rhythm 40s to 50s.  Narrow complex QRS. ? ?Diagnostic results, plan of management and follow-up plan reviewed with the patient and his daughter at bedside.  All questions answered. ? ?At this time with clinical findings of pleurisy plus wet cough plus objective crackle and wheeze local to the right lung base, will opt to treat empirically with doxycycline for bronchitis\early pneumonia in patient with advanced age.  Patient does have gallstones present but no wall thickening no leukocytosis or fever.  At this time plan will be for gallbladder dietary restrictions and close follow-up with PCP.  Discussed referral to general surgery for evaluation of cholecystectomy.  We also discussed immediate return precautions for fever, worsening pain, shortness of breath or other concerning symptoms. ? ? ? ? ? ? ? ? ? ?Final Clinical Impression(s) / ED Diagnoses ?Final diagnoses:  ?None  ? ? ?Rx / DC Orders ?ED Discharge Orders   ? ? None  ? ?  ? ? ?  ?Charlesetta Shanks, MD ?02/14/22 1639 ? ?

## 2022-02-14 NOTE — Discharge Instructions (Addendum)
You have gallstones. Eat a very lowfat diet. See your doctor for continues monitoring to determine if you need referral to a surgeon for gallbladder removal. ?Your pain seems to be due to pleurisy. Your CT scan is not showing a pneumonia at this time, however you have a productive cough and crackles and wheeze in the area of your pain.  You are being treated with an antibiotic called doxycycline.  ?You may take one Vicodin tablet for pain every 4-6 hours. If your pain is not severe, you may take regular tylenol instead. Vicodin can cause constipation, so take a stool softener such as over the counter colace if you tend to get constipated. ?Return to the  Emergency Department immediately if you get worsening pain, shortness of breath, vomiting, fever or other concerning symptoms. ?

## 2022-02-17 ENCOUNTER — Ambulatory Visit: Payer: Medicare Other | Admitting: Cardiology

## 2022-02-27 ENCOUNTER — Other Ambulatory Visit: Payer: Self-pay | Admitting: Nurse Practitioner

## 2022-02-27 DIAGNOSIS — F411 Generalized anxiety disorder: Secondary | ICD-10-CM

## 2022-03-01 ENCOUNTER — Other Ambulatory Visit: Payer: Self-pay | Admitting: Nurse Practitioner

## 2022-03-01 DIAGNOSIS — R42 Dizziness and giddiness: Secondary | ICD-10-CM

## 2022-03-04 ENCOUNTER — Ambulatory Visit (INDEPENDENT_AMBULATORY_CARE_PROVIDER_SITE_OTHER): Payer: Medicare Other | Admitting: Nurse Practitioner

## 2022-03-04 ENCOUNTER — Encounter: Payer: Self-pay | Admitting: Nurse Practitioner

## 2022-03-04 VITALS — BP 112/71 | HR 49 | Temp 97.0°F | Resp 20 | Ht 69.0 in | Wt 196.0 lb

## 2022-03-04 DIAGNOSIS — K8020 Calculus of gallbladder without cholecystitis without obstruction: Secondary | ICD-10-CM | POA: Diagnosis not present

## 2022-03-04 DIAGNOSIS — D229 Melanocytic nevi, unspecified: Secondary | ICD-10-CM | POA: Diagnosis not present

## 2022-03-04 NOTE — Progress Notes (Signed)
? ?  Subjective:  ? ? Patient ID: Francisco [name], male    DOB: 06/13/1941, 81 y.o.   MRN: [id number] ? ? ?Chief Complaint: ER follow up ? ? ?HPI ?Patient went to the ED on 02/14/22 with flank pain. He was dx with gall stones and was told if he was still having problems he needed to see a Psychologist, sport and exercise. He says he is still having pain. Rates pain 3/10 currently. Worsens after he eats. He is protonix and still has occasional heart burn ? ? ? ?Review of Systems  ?Gastrointestinal:  Positive for abdominal pain. Negative for constipation, diarrhea, nausea and vomiting.  ?All other systems reviewed and are negative. ? ?   ?Objective:  ? Physical Exam ?Vitals reviewed.  ?Constitutional:   ?   Appearance: Normal appearance. He is obese.  ?Cardiovascular:  ?   Rate and Rhythm: Normal rate and regular rhythm.  ?   Heart sounds: Normal heart sounds.  ?Pulmonary:  ?   Effort: Pulmonary effort is normal.  ?   Breath sounds: Normal breath sounds.  ?Skin: ?   General: Skin is warm.  ?   Comments: 2cm raised dark mole on left shoulder  ?Neurological:  ?   General: No focal deficit present.  ?   Mental Status: He is alert and oriented to person, place, and time.  ?Psychiatric:     ?   Mood and Affect: Mood normal.     ?   Behavior: Behavior normal.  ? ?BP 112/71   Pulse (!) 49   Temp (!) 97 ?F (36.1 ?C) (Temporal)   Resp 20   Ht '5\' 9"'$  (1.753 m)   Wt 196 lb (88.9 kg)   SpO2 96%   BMI 28.94 kg/m?  ? ? ? ? ?   ?Assessment & Plan:  ? ?Francisco [name] in today with chief complaint of ER follow up ? ? ?1. Gall stones ?Avoid spicy and fatty foods ?Bland diet until can see surgeon ?- Ambulatory referral to General Surgery ? ?2. Atypical mole ?Do not pick or scratch at area ?- Ambulatory referral to Dermatology ? ? ? ?The above assessment and management plan was discussed with the patient. The patient verbalized understanding of and has agreed to the management plan. Patient is aware to call the clinic if symptoms persist or worsen. Patient is  aware when to return to the clinic for a follow-up visit. Patient educated on when it is appropriate to go to the emergency department.  ? ?[name], FNP ? ? ?

## 2022-03-04 NOTE — Patient Instructions (Signed)
Chol?lithiase ?Cholelithiasis ? ?La chol?lithiase est une maladie caract?ris?e par la formation de calculs biliaires dans la v?sicule biliaire. La v?sicule biliaire est un organe qui stocke la bile. La bile est un liquide qui aide ? la digestion des graisses. Les calculs biliaires d?butent sous forme de petits cristaux qui peuvent peu ? peu ?voluer en calculs. Ils peuvent ne causer aucun sympt?me jusqu?? ce qu?ils bloquent le conduit de la v?sicule biliaire, ou conduit cystique, lorsque la v?sicule biliaire se resserre (se contracte) apr?s l?ingestion de nourriture. Cela peut provoquer des douleurs, connues sous le nom de crise de v?sicule biliaire ou de colique biliaire. ?Il existe deux types principaux de calculs biliaires : ?Les calculs de cholest?rol. Ceux-ci repr?sentent le type de calculs biliaires le plus courant. Ces calculs sont constitu?s de cholest?rol durci et sont g?n?ralement jaune-vert. Le cholest?rol est une substance produite par [employer] ressemble ? de la graisse. ?Les calculs pigmentaires. Ceux-ci sont de couleur fonc?e et sont constitu?s d?une substance jaune-rouge, appel?e bilirubine,qui se forme lorsque l?h?moglobine des globules rouges se d?compose. ?Quelles sont les causes ? ?Cette affection peut ?tre caus?e par un d?s?quilibre des diff?rents ?l?ments qui composent la bile. Cela peut se produire si la bile : ?Contient trop de bilirubine. Cela peut se produire lors de [employer] sang, comme l?an?mie dr?panocytaire. ?Contient trop de cholest?rol. ?Ne contient pas assez de sels biliaires. Ces sels aident l?organisme ? absorber et ? Geophysical data processor. ?Dans certains cas, cette affection est due au fait que la v?sicule biliaire ne se vide pas compl?tement ou pas assez souvent. Cela se produit fr?quemment pendant la grossesse. ?Quels sont les facteurs qui augmentent le risque ? ?Les facteurs suivants peuvent augmenter le risque de d?velopper cette affection : ??tre [employer]. ?Avoir eu des grossesses multiples. Les prestataires de [place] de sant? conseillent parfois de retirer la v?sicule biliaire malade avant toute grossesse ult?rieure. ?Avoir une alimentation riche en fritures, graisses et glucides raffin?s, comme le pain et le riz blancs. ??tre ob?se. ?Avoir plus de 40 ans. ?Utiliser depuis longtemps des m?dicaments contenant des hormones f?minines (?strog?nes). ?Perdre du poids rapidement. ?Avoir des ant?c?dents familiaux de calculs biliaires. ?Souffrir de certains probl?mes de sant?, tels que : ?Le diab?te sucr?Marland Kitchen ?La mucoviscidose. ?La maladie de Crohn. ?Une cirrhose ou une autre maladie h?patique ? long terme (chronique). ?Certaines maladies du sang, comme l?an?mie dr?panocytaire ou une leuc?mie. ?Quels sont les signes ou sympt?mes ? ?Dans de nombreux cas, la pr?sence de calculs biliaires ne provoque aucun sympt?me. Lorsque vous avez des calculs biliaires sans pr?senter de sympt?mes, on parle de calculs biliaires silencieux. Si un calcul biliaire obstrue votre conduit chol?doque, cela peut provoquer une crise de v?sicule biliaire. Le principal sympt?me d?une crise de v?sicule biliaire est une douleur soudaine dans la partie sup?rieure droite de l?abdomen. La douleur : ?Appara?t en g?n?ral le soir ou apr?s avoir mang?Marland Kitchen ?Peut durer une heure ou davantage. ?Peut se propager ? l??paule droite, au dos ou ? la poitrine. ?Peut ressembler ? une indigestion. Il s?agit d?une g?ne, d?une sensation de br?lure ou de trop-plein dans la partie sup?rieure de l?abdomen. ?Si le conduit chol?doque est obstru? pendant plus de quelques heures, cela peut provoquer une infection ou une inflammation de la v?sicule biliaire (chol?cystite), du foie ou du pancr?as. Ce qui peut engendrer : ?Des naus?es ou vomissements. ?Des ballonnements. ?Une douleur ? l?abdomen durant 5 heures ou davantage. ?Une sensibilit? dans la partie sup?rieure de l?abdomen, souvent dans la partie sup?rieure droite, sous la cage  thoracique. ?  Une fi?vre ou des frissons. ?La peau ou le blanc des yeux qui deviennent jaunes [place]). En g?n?ral, cela se produit lorsqu?un calcul obstrue le conduit chol?doque et emp?che le passage de la bile. ?Une urine fonc?e ou des [employer]. ?Comment se fait le diagnostic ? ?Le diagnostic de cette affection peut reposer sur : ?Environmental health practitioner. ?Vos ant?c?dents m?dicaux. ?Une ?chographie. ?Une tomodensitom?trie. ?Une IRM. ?Vous pourrez ?Horticulturist, commercial d?autres examens, tels que : ?Des analyses de sang pour v?rifier la pr?sence de signes d?infection ou d?inflammation. ?Une chol?scintigraphie, ou scintigraphie h?patobiliaire. Il s?agit d?un examen d?imagerie de la v?sicule biliaire et des canaux biliaires (syst?me biliaire) ? l?aide de mat?riel radioactif inoffensif et de cam?ras sp?ciales capables de voir le mat?riel radioactif. ?Une cholangiopancr?atographie r?trograde endoscopique. Cet examen consiste ? introduire un petit tube muni d?une cam?ra ? son extr?mit? (endoscope) par la bouche pour [employer] canaux biliaires et v?rifier la pr?sence de blocages. ?Comment cette affection est-elle trait?e ? ?Le traitement de cette affection d?pend de sa gravit?Romilda Joy calculs biliaires silencieux ne n?cessitent aucun traitement. Un traitement pourra s?av?rer n?cessaire s?il existe un blocage causant une crise de v?sicule biliaire ou d?autres sympt?mes. Le traitement pourra consister ? : ?Des soins ? domicile, si les sympt?mes ne sont pas graves. ?Au cours d?une simple crise de v?sicule biliaire, cessez de manger et de boire (sauf de l?eau et des liquides clairs) pendant 12 ? 24 heures. Cela permettra de ? calmer ? votre v?sicule biliaire. Apr?s 1 jour ou 2, vous pourrez commencer ? manger des aliments simples ou clairs, comme des bouillons et des biscuits sal?s. ?Vous aurez peut-?tre aussi besoin de prendre des m?dicaments pour soulager la douleur ou les naus?es, voire les deux. ?Si vous avez une  chol?cystite et une infection, vous devrez prendre [employer]. ?Une hospitalisation, si cela s?av?re n?cessaire pour contr?ler la douleur ou en cas de chol?cystite avec une infection grave. ?Une chol?cystectomie, ou intervention chirurgicale pour retirer la v?sicule biliaire. Il s?agit du [employer] plus fr?quent lorsque tous les autres [employer] ?chou?Marland Kitchen ?Des m?dicaments pour fragmenter les calculs biliaires. Ce sont les m?dicaments qui sont les plus efficaces pour traiter les petits calculs biliaires. Les m?dicaments pourront ?tre utilis?s pendant 6 ? 12 mois tout au plus. ?Une cholangiopancr?atographie r?trograde endoscopique. Un petit panier peut ?tre fix? ? l?endoscope et utilis? pour attraper et retirer les calculs biliaires, en particulier ceux qui se trouvent [employer] conduit chol?doque. ?Suivez les instructions suivantes ? domicile : ?M?dicaments ?Prenez vos m?dicaments en vente libre et sur [employer] en suivant scrupuleusement les instructions de votre prestataire de soins de sant?Marland Kitchen ?Si un antibiotique vous a ?t? prescrit, prenez-le en suivant les recommandations de votre prestataire de soins de sant?Marland Kitchen N?arr?tez pas de prendre l?antibiotique m?me si vous commencez ? vous sentir mieux. ?Demandez ? Landscape architect de soins de sant? si vous pouvez conduire ou utiliser des machines lorsque vous prenez le m?dicament qu?il vous a prescrit. ?Alimentation et boissons ?Buvez des quantit?s suffisantes de liquides pour garder votre urine jaune p?le. Cela est important pendant une crise de v?sicule biliaire. Buvez de pr?f?rence de l?eau et des [employer]. ?Eldred un r?gime alimentaire sain. Par exemple : ?R?duisez votre consommation d?aliments gras, comme les aliments frits et ceux riches en cholest?rol. ?R?duisez votre consommation de glucides raffin?s comme le pain et le riz blancs. ?Consommez davantage de fibres. Choisissez des [employer], les fruits et [employer]. ?Consommation d?alcool ?Si vous consommez de l?alcool : ?Nurse, mental health ? : ?  1 verre par jour pour [employer] qui ne sont pas enceintes. ?2 verres par [employer]. ?Stann Ore conscience de la quantit? d?alc

## 2022-03-17 ENCOUNTER — Ambulatory Visit: Payer: Medicare Other | Admitting: General Surgery

## 2022-03-17 ENCOUNTER — Encounter: Payer: Self-pay | Admitting: General Surgery

## 2022-03-17 VITALS — BP 103/66 | HR 52 | Temp 98.0°F | Resp 16 | Ht 69.0 in | Wt 195.0 lb

## 2022-03-17 DIAGNOSIS — K812 Acute cholecystitis with chronic cholecystitis: Secondary | ICD-10-CM | POA: Insufficient documentation

## 2022-03-17 DIAGNOSIS — K8020 Calculus of gallbladder without cholecystitis without obstruction: Secondary | ICD-10-CM

## 2022-03-17 NOTE — Progress Notes (Signed)
[facility] History and Physical ? ?Reason for Referral: Incidental gallstones  ?Referring Physician:  ED  ? ?Chief Complaint   ?New Patient (Initial Visit) ?  ? ? ?Francisco [name] is a 81 y.o. male.  ?HPI: Francisco [name] is a 81 yo here today with his daughter. He reports he was in the Ed with complaints of right sided chest pain. His work up included a CT chest and US of the RUQ. His pain seemed more pleuritic by description and he had a cough. He was treated with doxycyline and referred to discuss his gallstones. He denies any RUQ abdominal pain and says that he does not hurt now. He never had pain with fatty  or greasy food and denies any bloating or nausea.  ? ?Past Medical History:  ?Diagnosis Date  ? Anxiety   ? GERD (gastroesophageal reflux disease)   ? Hypertension   ? ? ?Past Surgical History:  ?Procedure Laterality Date  ? AMPUTATION Left 08/05/2016  ? Procedure: REVISION AMPUTATION LEFT INDEX FINGER REPAIR LACERATION LEFT THUMB;  Surgeon: [name], MD;  Location: [place];  Service: Orthopedics;  Laterality: Left;  ? SPINE SURGERY    ? ? ?Family History  ?Problem Relation Age of Onset  ? Healthy Mother   ? Transient ischemic attack Father   ? Rheum arthritis Daughter   ? Amblyopia Neg Hx   ? Blindness Neg Hx   ? Cataracts Neg Hx   ? Glaucoma Neg Hx   ? Macular degeneration Neg Hx   ? Retinal detachment Neg Hx   ? Strabismus Neg Hx   ? Retinitis pigmentosa Neg Hx   ? ? ?Social History  ? ?Tobacco Use  ? Smoking status: Former  ?  Types: Cigarettes  ?  Quit date: 02/23/1972  ?  Years since quitting: 50.0  ? Smokeless tobacco: Never  ?Vaping Use  ? Vaping Use: Never used  ?Substance Use Topics  ? Alcohol use: Yes  ?  Alcohol/week: 1.0 standard drink  ?  Types: 1 Standard drinks or equivalent per week  ?  Comment: occasional beer  ? Drug use: No  ? ? ?Medications: I have reviewed the patient's current medications. ?Allergies as of 03/17/2022   ? ?   Reactions  ? Crestor [rosuvastatin]   ? Lipitor  [atorvastatin]   ? Penicillins   ? REACTION: swelling/hives ?Has patient had a PCN reaction causing immediate rash, facial/tongue/throat swelling, SOB or lightheadedness with hypotension:yes ?Has patient had a PCN reaction causing severe rash involving mucus membranes or skin necrosis: Yes ?Has patient had a PCN reaction that required hospitalization No ?Has patient had a PCN reaction occurring within the last 10 years: No ?If all of the above answers are "NO", then may proceed with Cephalosporin use.  ? Symbicort [budesonide-formoterol Fumarate]   ? Pain  Behind ribs  ? ?  ? ?  ?Medication List  ?  ? ?  ? Accurate as of Mar 17, 2022 10:42 AM. If you have any questions, ask your nurse or doctor.  ?  ?  ? ?  ? ?ALPRAZolam 1 MG tablet ?Commonly known as: Duanne Moron ?Take 1 tablet (1 mg total) by mouth 2 (two) times daily as needed. ?  ?aspirin 81 MG tablet ?Take 81 mg by mouth daily. ?  ?escitalopram 10 MG tablet ?Commonly known as: LEXAPRO ?Take 2 tablets (20 mg total) by mouth daily. ?  ?fish oil-omega-3 fatty acids 1000 MG capsule ?Take 2 g by mouth daily. ?  ?  furosemide 20 MG tablet ?Commonly known as: LASIX ?Take 1 tablet (20 mg total) by mouth daily. ?  ?meclizine 25 MG tablet ?Commonly known as: ANTIVERT ?TAKE 1 TABLET BY MOUTH 3  TIMES DAILY AS NEEDED FOR  DIZZINESS ?  ?meloxicam 7.5 MG tablet ?Commonly known as: MOBIC ?TAKE 1 TABLET BY MOUTH  DAILY ?  ?metoprolol succinate 25 MG 24 hr tablet ?Commonly known as: Toprol XL ?Take 1 tablet (25 mg total) by mouth 2 (two) times daily. ?  ?pantoprazole 20 MG tablet ?Commonly known as: PROTONIX ?Take 1 tablet (20 mg total) by mouth daily. ?  ?simvastatin 80 MG tablet ?Commonly known as: Zocor ?Take 1 tablet (80 mg total) by mouth daily. ?  ?tamsulosin 0.4 MG Caps capsule ?Commonly known as: FLOMAX ?Take 1 capsule (0.4 mg total) by mouth daily. ?  ? ?  ? ? ? ?ROS:  ?A comprehensive review of systems was negative except for: Respiratory: positive for cough and right flank  pain ?Gastrointestinal: positive for right flank side pain ?Genitourinary: positive for frequency ? ?Blood pressure 103/66, pulse (!) 52, temperature 98 ?F (36.7 ?C), temperature source Oral, resp. rate 16, height '5\' 9"'$  (1.753 m), weight 195 lb (88.5 kg), SpO2 95 %. ?Physical Exam ?Vitals reviewed.  ?Constitutional:   ?   Appearance: Normal appearance.  ?HENT:  ?   Head: Normocephalic.  ?   Mouth/Throat:  ?   Mouth: Mucous membranes are moist.  ?Eyes:  ?   Extraocular Movements: Extraocular movements intact.  ?Cardiovascular:  ?   Rate and Rhythm: Normal rate and regular rhythm.  ?Pulmonary:  ?   Effort: Pulmonary effort is normal.  ?Abdominal:  ?   General: There is no distension.  ?   Palpations: Abdomen is soft.  ?   Tenderness: There is no abdominal tenderness.  ?Musculoskeletal:     ?   General: Normal range of motion.  ?Skin: ?   General: Skin is warm.  ?Neurological:  ?   General: No focal deficit present.  ?   Mental Status: He is alert.  ?Psychiatric:     ?   Mood and Affect: Mood normal.  ? ? ?Results: ?CLINICAL DATA:  Right upper quadrant pain for 1 week ?  ?EXAM: ?ULTRASOUND ABDOMEN LIMITED RIGHT UPPER QUADRANT ?  ?COMPARISON:  None. ?  ?FINDINGS: ?Gallbladder: ?  ?1.2 cm calculus in the dependent gallbladder. Mildly prominent ?gallbladder wall measuring up to 4 mm. No pericholecystic fluid or ?increased vascularity. No sonographic Murphy sign noted by ?sonographer. ?  ?Common bile duct: ?  ?Diameter: 3 mm ?  ?Liver: ?  ?No focal lesion identified. Echogenic hepatic parenchyma. Portal ?vein is patent on color Doppler imaging with normal direction of ?blood flow towards the liver. ?  ?Other: None. ?  ?IMPRESSION: ?1. Cholelithiasis without evidence of acute cholecystitis. ?2. Mild hepatic steatosis. ?  ?  ?Electronically Signed ?  By: Keane Police D.O. ?  On: 02/14/2022 15:58 ?  ? ?Assessment & Plan:  ?Francisco [name] is a 81 y.o. male with gallstones found incidentally. He has no true gallbladder type  symptoms. Discussed symptoms and what to watch for. He is going to monitor while he eats his normal diet.  ?Discussed cholecystectomy and discussed risk and discussed that if he starts having issues can determine if he needs to have gallbladder removed then. ?-Follow up PRN ? ?All questions were answered to the satisfaction of the patient and family. ? ? ? ?Francisco [name] ?03/17/2022,  10:42 AM  ? ? ? ? ? ?

## 2022-03-17 NOTE — Patient Instructions (Signed)
Cholelithiasis ? ?Cholelithiasis is a disease in which gallstones form in the gallbladder. The gallbladder is an organ that stores bile. Bile is a fluid that helps to digest fats. Gallstones begin as small crystals and can slowly grow into stones. They may cause no symptoms until they block the gallbladder duct, or cystic duct, when the gallbladder tightens (contracts) after food is eaten. This can cause pain and is known as a gallbladder attack, or biliary colic. ?There are two main types of gallstones: ?Cholesterol stones. These are the most common type of gallstone. These stones are made of hardened cholesterol and are usually yellow-green in color. Cholesterol is a fat-like substance that is made in the liver. ?Pigment stones. These are dark in color and are made of a red-yellow substance, called bilirubin,that forms when hemoglobin from red blood cells breaks down. ?What are the causes? ?This condition may be caused by an imbalance in the different parts that make bile. This can happen if the bile: ?Has too much bilirubin. This can happen in certain blood diseases, such as sickle cell anemia. ?Has too much cholesterol. ?Does not have enough bile salts. These salts help the body absorb and digest fats. ?In some cases, this condition can also be caused by the gallbladder not emptying completely or often enough. This is common during pregnancy. ?What increases the risk? ?The following factors may make you more likely to develop this condition: ?Being male. ?Having multiple pregnancies. Health care providers sometimes advise removing diseased gallbladders before future pregnancies. ?Eating a diet that is heavy in fried foods, fat, and refined carbohydrates, such as white bread and white rice. ?Being obese. ?Being older than age 20. ?Using medicines that contain male hormones (estrogen) for a long time. ?Losing weight quickly. ?Having a family history of gallstones. ?Having certain medical problems, such  as: ?Diabetes mellitus. ?Cystic fibrosis. ?Crohn's disease. ?Cirrhosis or other long-term (chronic) liver disease. ?Certain blood diseases, such as sickle cell anemia or leukemia. ?What are the signs or symptoms? ?In many cases, having gallstones causes no symptoms. When you have gallstones but do not have symptoms, you have silent gallstones. If a gallstone blocks your bile duct, it can cause a gallbladder attack. The main symptom of a gallbladder attack is sudden pain in the upper right part of the abdomen. The pain: ?Usually comes at night or after eating. ?Can last for one hour or more. ?Can spread to your right shoulder, back, or chest. ?Can feel like indigestion. This is discomfort, burning, or fullness in your upper abdomen. ?If the bile duct is blocked for more than a few hours, it can cause an infection or inflammation of your gallbladder (cholecystitis), liver, or pancreas. This can cause: ?Nausea or vomiting. ?Bloating. ?Pain in your abdomen that lasts for 5 hours or longer. ?Tenderness in your upper abdomen, often in the upper right section and under your rib cage. ?Fever or chills. ?Skin or the white parts of your eyes turning yellow (jaundice). This usually happens when a stone has blocked bile from passing through the common bile duct. ?Dark urine or light-colored stools. ?How is this diagnosed? ?This condition may be diagnosed based on: ?A physical exam. ?Your medical history. ?Ultrasound. ?CT scan. ?MRI. ?You may also have other tests, including: ?Blood tests to check for signs of an infection or inflammation. ?Cholescintigraphy, or HIDA scan. This is a scan of your gallbladder and bile ducts (biliary system) using non-harmful radioactive material and special cameras that can see the radioactive material. ?Endoscopic retrograde cholangiopancreatogram. This  involves inserting a small tube with a camera on the end (endoscope) through your mouth to look at bile ducts and check for blockages. ?How is  this treated? ?Treatment for this condition depends on the severity of the condition. Silent gallstones do not need treatment. Treatment may be needed if a blockage causes a gallbladder attack or other symptoms. Treatment may include: ?Home care, if symptoms are not severe. ?During a simple gallbladder attack, stop eating and drinking for 12-24 hours (except for water and clear liquids). This helps to "cool down" your gallbladder. After 1 or 2 days, you can start to eat a diet of simple or clear foods, such as broths and crackers. ?You may also need medicines for pain or nausea or both. ?If you have cholecystitis and an infection, you will need antibiotics. ?A hospital stay, if needed for pain control or for cholecystitis with severe infection. ?Cholecystectomy, or surgery to remove your gallbladder. This is the most common treatment if all other treatments have not worked. ?Medicines to break up gallstones. These are most effective at treating small gallstones. Medicines may be used for up to 6-12 months. ?Endoscopic retrograde cholangiopancreatogram. A small basket can be attached to the endoscope and used to capture and remove gallstones, mainly those that are in the common bile duct. ?Follow these instructions at home: ?Medicines ?Take over-the-counter and prescription medicines only as told by your health care provider. ?If you were prescribed an antibiotic medicine, take it as told by your health care provider. Do not stop taking the antibiotic even if you start to feel better. ?Ask your health care provider if the medicine prescribed to you requires you to avoid driving or using machinery. ?Eating and drinking ?Drink enough fluid to keep your urine pale yellow. This is important during a gallbladder attack. Water and clear liquids are preferred. ?Follow a healthy diet. This includes: ?Reducing fatty foods, such as fried food and foods high in cholesterol. ?Reducing refined carbohydrates, such as white bread  and white rice. ?Eating more fiber. Aim for foods such as almonds, fruit, and beans. ?Alcohol use ?If you drink alcohol: ?Limit how much you use to: ?0-1 drink a day for nonpregnant women. ?0-2 drinks a day for men. ?Be aware of how much alcohol is in your drink. In the [place] one drink equals one 12 oz bottle of beer (355 mL), one 5 oz glass of wine (148 mL), or one 1? oz glass of hard liquor (44 mL). ?General instructions ?Do not use any products that contain nicotine or tobacco, such as cigarettes, e-cigarettes, and chewing tobacco. If you need help quitting, ask your health care provider. ?Maintain a healthy weight. ?Keep all follow-up visits as told by your health care provider. These may include consultations with a surgeon or specialist. This is important. ?Where to find more information ?[employer] of Diabetes and Digestive and Kidney Diseases: [email or web address] ?Contact a health care provider if: ?You think you have had a gallbladder attack. ?You have been diagnosed with silent gallstones and you develop pain in your abdomen or indigestion. ?You begin to have attacks more often. ?You have dark urine or light-colored stools. ?Get help right away if: ?You have pain from a gallbladder attack that lasts for more than 2 hours. ?You have pain in your abdomen that lasts for more than 5 hours or is getting worse. ?You have a fever or chills. ?You have nausea and vomiting that do not go away. ?You develop jaundice. ?Summary ?Cholelithiasis is a disease in which  gallstones form in the gallbladder. ?This condition may be caused by an imbalance in the different parts that make bile. This can happen if your bile has too much bilirubin or cholesterol, or does not have enough bile salts. ?Treatment for gallstones depends on the severity of the condition. Silent gallstones do not need treatment. ?If gallstones cause a gallbladder attack or other symptoms, treatment usually involves not eating or drinking anything.  Treatment may also include pain medicines and antibiotics, and it sometimes includes a hospital stay. ?Surgery to remove the gallbladder is common if all other treatments have not worked. ?This information is no

## 2022-03-19 ENCOUNTER — Other Ambulatory Visit: Payer: Self-pay | Admitting: Nurse Practitioner

## 2022-03-19 DIAGNOSIS — N400 Enlarged prostate without lower urinary tract symptoms: Secondary | ICD-10-CM

## 2022-03-20 ENCOUNTER — Other Ambulatory Visit: Payer: Self-pay | Admitting: Nurse Practitioner

## 2022-03-20 DIAGNOSIS — I10 Essential (primary) hypertension: Secondary | ICD-10-CM

## 2022-04-30 ENCOUNTER — Other Ambulatory Visit: Payer: Self-pay | Admitting: Nurse Practitioner

## 2022-05-03 ENCOUNTER — Other Ambulatory Visit: Payer: Self-pay | Admitting: Cardiology

## 2022-05-05 ENCOUNTER — Inpatient Hospital Stay (HOSPITAL_COMMUNITY)
Admission: EM | Admit: 2022-05-05 | Discharge: 2022-05-08 | DRG: 872 | Disposition: A | Discharge status: Home / Self Care | Payer: Medicare Other | Attending: Internal Medicine | Admitting: Internal Medicine

## 2022-05-05 ENCOUNTER — Emergency Department (HOSPITAL_COMMUNITY): Payer: Medicare Other

## 2022-05-05 ENCOUNTER — Inpatient Hospital Stay (HOSPITAL_COMMUNITY): Payer: Medicare Other

## 2022-05-05 ENCOUNTER — Other Ambulatory Visit: Payer: Self-pay

## 2022-05-05 DIAGNOSIS — M542 Cervicalgia: Secondary | ICD-10-CM | POA: Diagnosis present

## 2022-05-05 DIAGNOSIS — Z888 Allergy status to other drugs, medicaments and biological substances status: Secondary | ICD-10-CM | POA: Diagnosis not present

## 2022-05-05 DIAGNOSIS — N400 Enlarged prostate without lower urinary tract symptoms: Secondary | ICD-10-CM | POA: Diagnosis present

## 2022-05-05 DIAGNOSIS — R531 Weakness: Secondary | ICD-10-CM | POA: Diagnosis not present

## 2022-05-05 DIAGNOSIS — Z89022 Acquired absence of left finger(s): Secondary | ICD-10-CM | POA: Diagnosis not present

## 2022-05-05 DIAGNOSIS — D6959 Other secondary thrombocytopenia: Secondary | ICD-10-CM | POA: Diagnosis present

## 2022-05-05 DIAGNOSIS — Z79899 Other long term (current) drug therapy: Secondary | ICD-10-CM | POA: Diagnosis not present

## 2022-05-05 DIAGNOSIS — Z20822 Contact with and (suspected) exposure to covid-19: Secondary | ICD-10-CM | POA: Diagnosis not present

## 2022-05-05 DIAGNOSIS — F419 Anxiety disorder, unspecified: Secondary | ICD-10-CM | POA: Diagnosis present

## 2022-05-05 DIAGNOSIS — E782 Mixed hyperlipidemia: Secondary | ICD-10-CM | POA: Diagnosis not present

## 2022-05-05 DIAGNOSIS — D696 Thrombocytopenia, unspecified: Secondary | ICD-10-CM | POA: Diagnosis not present

## 2022-05-05 DIAGNOSIS — K219 Gastro-esophageal reflux disease without esophagitis: Secondary | ICD-10-CM | POA: Diagnosis present

## 2022-05-05 DIAGNOSIS — N390 Urinary tract infection, site not specified: Secondary | ICD-10-CM | POA: Diagnosis present

## 2022-05-05 DIAGNOSIS — K224 Dyskinesia of esophagus: Secondary | ICD-10-CM | POA: Diagnosis present

## 2022-05-05 DIAGNOSIS — G8929 Other chronic pain: Secondary | ICD-10-CM | POA: Diagnosis present

## 2022-05-05 DIAGNOSIS — K8020 Calculus of gallbladder without cholecystitis without obstruction: Secondary | ICD-10-CM | POA: Diagnosis not present

## 2022-05-05 DIAGNOSIS — R1110 Vomiting, unspecified: Secondary | ICD-10-CM | POA: Diagnosis not present

## 2022-05-05 DIAGNOSIS — R109 Unspecified abdominal pain: Secondary | ICD-10-CM | POA: Diagnosis not present

## 2022-05-05 DIAGNOSIS — E872 Acidosis, unspecified: Secondary | ICD-10-CM | POA: Diagnosis present

## 2022-05-05 DIAGNOSIS — Z7982 Long term (current) use of aspirin: Secondary | ICD-10-CM | POA: Diagnosis not present

## 2022-05-05 DIAGNOSIS — R42 Dizziness and giddiness: Secondary | ICD-10-CM | POA: Diagnosis not present

## 2022-05-05 DIAGNOSIS — A4151 Sepsis due to Escherichia coli [E. coli]: Secondary | ICD-10-CM | POA: Diagnosis present

## 2022-05-05 DIAGNOSIS — N419 Inflammatory disease of prostate, unspecified: Secondary | ICD-10-CM | POA: Diagnosis present

## 2022-05-05 DIAGNOSIS — I739 Peripheral vascular disease, unspecified: Secondary | ICD-10-CM | POA: Diagnosis present

## 2022-05-05 DIAGNOSIS — I700 Atherosclerosis of aorta: Secondary | ICD-10-CM | POA: Diagnosis not present

## 2022-05-05 DIAGNOSIS — I129 Hypertensive chronic kidney disease with stage 1 through stage 4 chronic kidney disease, or unspecified chronic kidney disease: Secondary | ICD-10-CM | POA: Diagnosis not present

## 2022-05-05 DIAGNOSIS — Z66 Do not resuscitate: Secondary | ICD-10-CM | POA: Diagnosis not present

## 2022-05-05 DIAGNOSIS — A419 Sepsis, unspecified organism: Secondary | ICD-10-CM | POA: Diagnosis present

## 2022-05-05 DIAGNOSIS — R6520 Severe sepsis without septic shock: Secondary | ICD-10-CM | POA: Diagnosis not present

## 2022-05-05 DIAGNOSIS — N179 Acute kidney failure, unspecified: Secondary | ICD-10-CM | POA: Diagnosis present

## 2022-05-05 DIAGNOSIS — Z880 Allergy status to penicillin: Secondary | ICD-10-CM

## 2022-05-05 DIAGNOSIS — I10 Essential (primary) hypertension: Secondary | ICD-10-CM | POA: Diagnosis not present

## 2022-05-05 DIAGNOSIS — Z87891 Personal history of nicotine dependence: Secondary | ICD-10-CM

## 2022-05-05 DIAGNOSIS — K828 Other specified diseases of gallbladder: Secondary | ICD-10-CM | POA: Diagnosis not present

## 2022-05-05 DIAGNOSIS — J439 Emphysema, unspecified: Secondary | ICD-10-CM | POA: Diagnosis not present

## 2022-05-05 DIAGNOSIS — N1831 Chronic kidney disease, stage 3a: Secondary | ICD-10-CM | POA: Diagnosis not present

## 2022-05-05 LAB — URINALYSIS, ROUTINE W REFLEX MICROSCOPIC
Bilirubin Urine: NEGATIVE
Glucose, UA: NEGATIVE mg/dL
Hgb urine dipstick: NEGATIVE
Ketones, ur: NEGATIVE mg/dL
Leukocytes,Ua: NEGATIVE
Nitrite: NEGATIVE
Protein, ur: NEGATIVE mg/dL
Specific Gravity, Urine: 1.021 (ref 1.005–1.030)
pH: 5 (ref 5.0–8.0)

## 2022-05-05 LAB — CBC WITH DIFFERENTIAL/PLATELET
Abs Immature Granulocytes: 0.02 10*3/uL (ref 0.00–0.07)
Basophils Absolute: 0 10*3/uL (ref 0.0–0.1)
Basophils Relative: 0 %
Eosinophils Absolute: 0 10*3/uL (ref 0.0–0.5)
Eosinophils Relative: 0 %
HCT: 35.1 % — ABNORMAL LOW (ref 39.0–52.0)
Hemoglobin: 11.9 g/dL — ABNORMAL LOW (ref 13.0–17.0)
Immature Granulocytes: 0 %
Lymphocytes Relative: 3 %
Lymphs Abs: 0.2 10*3/uL — ABNORMAL LOW (ref 0.7–4.0)
MCH: 33 pg (ref 26.0–34.0)
MCHC: 33.9 g/dL (ref 30.0–36.0)
MCV: 97.2 fL (ref 80.0–100.0)
Monocytes Absolute: 0 10*3/uL — ABNORMAL LOW (ref 0.1–1.0)
Monocytes Relative: 1 %
Neutro Abs: 6.7 10*3/uL (ref 1.7–7.7)
Neutrophils Relative %: 96 %
Platelets: 105 10*3/uL — ABNORMAL LOW (ref 150–400)
RBC: 3.61 MIL/uL — ABNORMAL LOW (ref 4.22–5.81)
RDW: 13.6 % (ref 11.5–15.5)
WBC: 7 10*3/uL (ref 4.0–10.5)
nRBC: 0 % (ref 0.0–0.2)

## 2022-05-05 LAB — COMPREHENSIVE METABOLIC PANEL
ALT: 22 U/L (ref 0–44)
AST: 26 U/L (ref 15–41)
Albumin: 3.6 g/dL (ref 3.5–5.0)
Alkaline Phosphatase: 43 U/L (ref 38–126)
Anion gap: 8 (ref 5–15)
BUN: 28 mg/dL — ABNORMAL HIGH (ref 8–23)
CO2: 26 mmol/L (ref 22–32)
Calcium: 8.4 mg/dL — ABNORMAL LOW (ref 8.9–10.3)
Chloride: 106 mmol/L (ref 98–111)
Creatinine, Ser: 1.63 mg/dL — ABNORMAL HIGH (ref 0.61–1.24)
GFR, Estimated: 42 mL/min — ABNORMAL LOW (ref >60)
Glucose, Bld: 107 mg/dL — ABNORMAL HIGH (ref 70–99)
Potassium: 4.3 mmol/L (ref 3.5–5.1)
Sodium: 140 mmol/L (ref 135–145)
Total Bilirubin: 1.2 mg/dL (ref 0.3–1.2)
Total Protein: 6.2 g/dL — ABNORMAL LOW (ref 6.5–8.1)

## 2022-05-05 LAB — LACTIC ACID, PLASMA
Lactic Acid, Venous: 2.3 mmol/L (ref 0.5–1.9)
Lactic Acid, Venous: 1.7 mmol/L (ref 0.5–1.9)

## 2022-05-05 LAB — RESP PANEL BY RT-PCR (FLU A&B, COVID) ARPGX2
Influenza A by PCR: NEGATIVE
Influenza B by PCR: NEGATIVE
SARS Coronavirus 2 by RT PCR: NEGATIVE

## 2022-05-05 LAB — PROTIME-INR
INR: 1.1 (ref 0.8–1.2)
Prothrombin Time: 14.1 seconds (ref 11.4–15.2)

## 2022-05-05 LAB — PROCALCITONIN: Procalcitonin: 1.23 ng/mL

## 2022-05-05 MED ORDER — LACTATED RINGERS IV BOLUS (SEPSIS)
1000.0000 mL | Freq: Once | INTRAVENOUS | Status: AC
  Administered 2022-05-05: 1000 mL via INTRAVENOUS

## 2022-05-05 MED ORDER — LACTATED RINGERS IV BOLUS
1000.0000 mL | Freq: Once | INTRAVENOUS | Status: AC
  Administered 2022-05-05: 1000 mL via INTRAVENOUS

## 2022-05-05 MED ORDER — LACTATED RINGERS IV SOLN: INTRAVENOUS | Status: DC

## 2022-05-05 MED ORDER — VANCOMYCIN HCL IN DEXTROSE 1-5 GM/200ML-% IV SOLN
1000.0000 mg | Freq: Once | INTRAVENOUS | Status: AC
  Administered 2022-05-05: 1000 mg via INTRAVENOUS
  Filled 2022-05-05: qty 200

## 2022-05-05 MED ORDER — SODIUM CHLORIDE 0.9 % IV SOLN
2.0000 g | Freq: Once | INTRAVENOUS | Status: AC
  Administered 2022-05-05: 2 g via INTRAVENOUS
  Filled 2022-05-05: qty 12.5

## 2022-05-05 MED ORDER — SODIUM CHLORIDE 0.9 % IV SOLN: 2.0000 g | Freq: Once | INTRAVENOUS | Status: DC

## 2022-05-05 MED ORDER — METRONIDAZOLE 500 MG/100ML IV SOLN
500.0000 mg | Freq: Once | INTRAVENOUS | Status: AC
  Administered 2022-05-05: 500 mg via INTRAVENOUS
  Filled 2022-05-05: qty 100

## 2022-05-05 MED ORDER — ACETAMINOPHEN 500 MG PO TABS
1000.0000 mg | ORAL_TABLET | Freq: Once | ORAL | Status: AC
  Administered 2022-05-05: 1000 mg via ORAL
  Filled 2022-05-05: qty 2

## 2022-05-05 MED ORDER — IOHEXOL 300 MG/ML  SOLN
100.0000 mL | Freq: Once | INTRAMUSCULAR | Status: AC | PRN
  Administered 2022-05-05: 100 mL via INTRAVENOUS

## 2022-05-05 NOTE — Progress Notes (Signed)
A consult was received from an ED physician for cefepime and vancomycin per pharmacy dosing.  The patient's profile has been reviewed for ht/wt/allergies/indication/available labs.   A one time order has been placed for cefepime 2g and vancomycin 1g.  Further antibiotics/pharmacy consults should be ordered by admitting physician if indicated.                       Thank you, Peggyann Juba, PharmD, BCPS 05/05/2022  7:30 PM

## 2022-05-05 NOTE — ED Notes (Addendum)
Pt reports he woke up shaking and breathing hard yesterday morning. Last night he "got a sore throat" and took two tylenol. This morning pt took one tylenol at 7:30 AM. Pt thinks he took Tylenol 325 mg.

## 2022-05-05 NOTE — ED Provider Notes (Signed)
[facility] DEPT Provider Note   CSN: [id number] Arrival date & time: 05/05/22  1700     History  Chief Complaint  Patient presents with   Weakness    Francisco [name] is a 81 y.o. male.  HPI  81 year old male with past medical history of HTN, GERD, anxiety presents emergency department with concern for general unwell feeling.  He states since yesterday he has been feeling generally unwell.  Yesterday he describes pain in his central abdomen that resolved.  He currently complains of intermittently productive cough with shortness of breath.  Denies any fever at home but endorses chills and severe shaking when he woke up.  Patient states he feels generally weak.  Does endorse nausea without any vomiting or diarrhea.  No active chest or abdominal pain.  Patient reportedly was hypotensive with EMS, fluid responsive.  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  He states that this is ongoing but not acutely changed.  No headache or change in mental status.  Home Medications Prior to Admission medications   Medication Sig Start Date End Date Taking? Authorizing Provider  ALPRAZolam Duanne Moron) 1 MG tablet Take 1 tablet (1 mg total) by mouth 2 (two) times daily as needed. 11/21/21   Hassell Done Mary-Margaret, FNP  aspirin 81 MG tablet Take 81 mg by mouth daily.    [provider]  escitalopram (LEXAPRO) 10 MG tablet Take 2 tablets (20 mg total) by mouth daily. 11/21/21   Hassell Done Mary-Margaret, FNP  fish oil-omega-3 fatty acids 1000 MG capsule Take 2 g by mouth daily.    [provider]  furosemide (LASIX) 20 MG tablet TAKE 1 TABLET BY MOUTH DAILY 03/23/22   Hassell Done, Mary-Margaret, FNP  meclizine (ANTIVERT) 25 MG tablet TAKE 1 TABLET BY MOUTH 3  TIMES DAILY AS NEEDED FOR  DIZZINESS 03/02/22   Hassell Done, Mary-Margaret, FNP  meloxicam (MOBIC) 7.5 MG tablet TAKE 1 TABLET BY MOUTH  DAILY 10/11/21   Hassell Done, Mary-Margaret, FNP  metoprolol succinate  (TOPROL-XL) 25 MG 24 hr tablet Take 1 tablet (25 mg total) by mouth 2 (two) times daily. 05/04/22   Park Liter, MD  pantoprazole (PROTONIX) 20 MG tablet TAKE 1 TABLET BY MOUTH DAILY 05/01/22   Hassell Done, Mary-Margaret, FNP  simvastatin (ZOCOR) 80 MG tablet Take 1 tablet (80 mg total) by mouth daily. 12/16/21   Park Liter, MD  tamsulosin (FLOMAX) 0.4 MG CAPS capsule TAKE 1 CAPSULE BY MOUTH DAILY 03/20/22   [name], FNP      Allergies    Crestor [rosuvastatin], Lipitor [atorvastatin], Penicillins, and Symbicort [budesonide-formoterol fumarate]    Review of Systems   Review of Systems  Constitutional:  Positive for chills and fatigue. Negative for fever.  Respiratory:  Positive for cough. Negative for shortness of breath.   Cardiovascular:  Negative for chest pain.  Gastrointestinal:  Positive for nausea. Negative for abdominal pain, diarrhea and vomiting.  Genitourinary:  Positive for dysuria and frequency. Negative for flank pain.  Musculoskeletal:  Negative for back pain.  Skin:  Negative for rash.  Neurological:  Negative for headaches.    Physical Exam Updated Vital Signs BP (!) 82/52   Pulse 81   Temp (!) 103.9 F (39.9 C) (Rectal)   Resp (!) 24   Ht '5\' 9"'$  (1.753 m)   Wt 76.7 kg   SpO2 95%   BMI 24.96 kg/m  Physical Exam Vitals and nursing note reviewed.  Constitutional:      General: He  is not in acute distress.    Appearance: Normal appearance.  HENT:     Head: Normocephalic.     Mouth/Throat:     Mouth: Mucous membranes are moist.  Eyes:     Pupils: Pupils are equal, round, and reactive to light.  Cardiovascular:     Rate and Rhythm: Normal rate.  Pulmonary:     Effort: Pulmonary effort is normal. No respiratory distress.     Breath sounds: No rales.  Abdominal:     Palpations: Abdomen is soft.     Tenderness: There is no abdominal tenderness. There is no guarding.  Musculoskeletal:     Cervical back: No rigidity.  Skin:    General:  Skin is warm.  Neurological:     Mental Status: He is alert and oriented to person, place, and time.     ED Results / Procedures / Treatments   Labs (all labs ordered are listed, but only abnormal results are displayed) Labs Reviewed  COMPREHENSIVE METABOLIC PANEL - Abnormal; Notable for the following components:      Result Value   Glucose, Bld 107 (*)    BUN 28 (*)    Creatinine, Ser 1.63 (*)    Calcium 8.4 (*)    Total Protein 6.2 (*)    GFR, Estimated 42 (*)    All other components within normal limits  CBC WITH DIFFERENTIAL/PLATELET - Abnormal; Notable for the following components:   RBC 3.61 (*)    Hemoglobin 11.9 (*)    HCT 35.1 (*)    Platelets 105 (*)    Lymphs Abs 0.2 (*)    Monocytes Absolute 0.0 (*)    All other components within normal limits  CULTURE, BLOOD (ROUTINE X 2)  CULTURE, BLOOD (ROUTINE X 2)  RESP PANEL BY RT-PCR (FLU A&B, COVID) ARPGX2  PROTIME-INR  URINALYSIS, ROUTINE W REFLEX MICROSCOPIC  LACTIC ACID, PLASMA  LACTIC ACID, PLASMA    EKG None  Radiology DG Chest 2 View  Result Date: 05/05/2022 CLINICAL DATA:  Suspected sepsis dizziness, weakness EXAM: CHEST - 2 VIEW COMPARISON:  Chest radiograph 02/14/2022 FINDINGS: The heart size is at the upper limits of normal for size. The upper mediastinal contours are normal. There is no focal consolidation or pulmonary edema. There is no pleural effusion or pneumothorax. There is no acute osseous abnormality. IMPRESSION: Stable chest with no radiographic evidence of acute cardiopulmonary process. Electronically Signed   By: Valetta Mole M.D.   On: 05/05/2022 18:09    Procedures .Critical Care  Performed by: Lorelle Gibbs, DO Authorized by: Lorelle Gibbs, DO   Critical care provider statement:    Critical care time (minutes):  60   Critical care time was exclusive of:  Separately billable procedures and treating other patients   Critical care was necessary to treat or prevent imminent or  life-threatening deterioration of the following conditions:  Sepsis and shock   Critical care was time spent personally by me on the following activities:  Development of treatment plan with patient or surrogate, discussions with consultants, evaluation of patient's response to treatment, examination of patient, ordering and review of laboratory studies, ordering and review of radiographic studies, ordering and performing treatments and interventions, pulse oximetry, re-evaluation of patient's condition and review of old charts   I assumed direction of critical care for this patient from another provider in my specialty: no     Care discussed with: admitting provider       Medications Ordered in ED  Medications  lactated ringers infusion ( Intravenous New Bag/Given 05/05/22 1832)  lactated ringers bolus 1,000 mL (has no administration in time range)  acetaminophen (TYLENOL) tablet 1,000 mg (1,000 mg Oral Given 05/05/22 1822)    ED Course/ Medical Decision Making/ A&P                           Medical Decision Making Amount and/or Complexity of Data Reviewed Labs: ordered. Radiology: ordered.  Risk OTC drugs. Prescription drug management. Decision regarding hospitalization.   81 year old male presents emergency department with general unwell feeling, episode of central abdominal pain, rigors and hypotension.  Patient was reportedly hypotensive prior to arrival with EMS, fluid responsive, normotensive on my evaluation.  He is febrile at 103 with a normal heart rate however he is on a beta-blocker.  Will be evaluated for sepsis.  Only current complaint is chronic neck pain, fatigue and feeling hungry.  No headache, no neck stiffness.  Blood work is reassuring without significant leukocytosis or lactic acidosis.  He has slightly worse than baseline AKI, flu and COVID swab is negative, chest x-ray shows no acute findings/pneumonia.  We are pending urinalysis.  Patient's blood pressure did drop,  initial lactic just over 2, continued fluid hydration per septic protocol.  Patient's blood pressure has improved with septic protocol fluids.  Urinalysis shows no signs of infection.  Patient will be treated broadly for sepsis of unknown source.  CT head is unremarkable, CT of the abdomen pelvis identifies cholelithiasis without any other acute infection.  Low suspicion for diagnosis like meningitis at this time given he has no active headache, neck stiffness, mental status change, significant leukocytosis.  We will plan for expanded respiratory pathogen panel with complaint of cough.  Blood cultures pending.  Lactic acid normalized.  Head CT unremarkable.  CT of the abdomen pelvis identifies cholelithiasis without any other acute findings.  Patient currently being treated broadly for sepsis of unknown source, continues to have soft pressures but fluid responsive.  Low suspicion for meningitis, do not feel emergent LP is necessary at this time.  Consulted with inpatient team, requesting CT of the chest for further rule out of septic etiology.  Patients evaluation and results requires admission for further treatment and care.  Spoke with hospitalist Dr. Cyd Silence, reviewed patient's ED course and they accept admission.  Patient agrees with admission plan, offers no new complaints and is stable/unchanged at time of admit.        Final Clinical Impression(s) / ED Diagnoses Final diagnoses:  None    Rx / DC Orders ED Discharge Orders     None         Lorelle Gibbs, DO 05/05/22 2302

## 2022-05-05 NOTE — ED Triage Notes (Signed)
[facility] EMS transported pt from home and reported the following. Pt woke at at 6 am with dizziness and weakness. Hx vertigo. EMS arrived at 10 AM and pt refused transport because he thought he would feel better. At 3 PM EMS called again because pt vomited and weakness and dizziness increased. Vitals upon EMS arrival NSR, BP 88/50, HR 82, RR 16, O2 99%, and CBG 115. EMS gave 500 ml NS given and BP increased to 112/80.

## 2022-05-06 ENCOUNTER — Inpatient Hospital Stay (HOSPITAL_COMMUNITY): Payer: Medicare Other

## 2022-05-06 ENCOUNTER — Encounter (HOSPITAL_COMMUNITY): Payer: Self-pay | Admitting: Internal Medicine

## 2022-05-06 DIAGNOSIS — N400 Enlarged prostate without lower urinary tract symptoms: Secondary | ICD-10-CM | POA: Diagnosis not present

## 2022-05-06 DIAGNOSIS — N1831 Chronic kidney disease, stage 3a: Secondary | ICD-10-CM | POA: Diagnosis not present

## 2022-05-06 DIAGNOSIS — K219 Gastro-esophageal reflux disease without esophagitis: Secondary | ICD-10-CM

## 2022-05-06 DIAGNOSIS — I10 Essential (primary) hypertension: Secondary | ICD-10-CM

## 2022-05-06 DIAGNOSIS — A419 Sepsis, unspecified organism: Secondary | ICD-10-CM | POA: Diagnosis not present

## 2022-05-06 DIAGNOSIS — E872 Acidosis, unspecified: Secondary | ICD-10-CM | POA: Diagnosis present

## 2022-05-06 DIAGNOSIS — D696 Thrombocytopenia, unspecified: Secondary | ICD-10-CM | POA: Diagnosis present

## 2022-05-06 DIAGNOSIS — E782 Mixed hyperlipidemia: Secondary | ICD-10-CM

## 2022-05-06 DIAGNOSIS — R6520 Severe sepsis without septic shock: Secondary | ICD-10-CM

## 2022-05-06 LAB — RESPIRATORY PANEL BY PCR

## 2022-05-06 LAB — COMPREHENSIVE METABOLIC PANEL
ALT: 26 U/L (ref 0–44)
AST: 30 U/L (ref 15–41)
Albumin: 3.3 g/dL — ABNORMAL LOW (ref 3.5–5.0)
Alkaline Phosphatase: 40 U/L (ref 38–126)
Anion gap: 5 (ref 5–15)
BUN: 25 mg/dL — ABNORMAL HIGH (ref 8–23)
CO2: 28 mmol/L (ref 22–32)
Calcium: 8.3 mg/dL — ABNORMAL LOW (ref 8.9–10.3)
Chloride: 108 mmol/L (ref 98–111)
Creatinine, Ser: 1.57 mg/dL — ABNORMAL HIGH (ref 0.61–1.24)
GFR, Estimated: 44 mL/min — ABNORMAL LOW (ref >60)
Glucose, Bld: 113 mg/dL — ABNORMAL HIGH (ref 70–99)
Potassium: 4.4 mmol/L (ref 3.5–5.1)
Sodium: 141 mmol/L (ref 135–145)
Total Bilirubin: 1.4 mg/dL — ABNORMAL HIGH (ref 0.3–1.2)
Total Protein: 5.6 g/dL — ABNORMAL LOW (ref 6.5–8.1)

## 2022-05-06 LAB — BLOOD CULTURE ID PANEL (REFLEXED) - BCID2

## 2022-05-06 LAB — CBC WITH DIFFERENTIAL/PLATELET
Abs Immature Granulocytes: 0.04 10*3/uL (ref 0.00–0.07)
Basophils Absolute: 0 10*3/uL (ref 0.0–0.1)
Basophils Relative: 0 %
Eosinophils Absolute: 0.5 10*3/uL (ref 0.0–0.5)
Eosinophils Relative: 3 %
HCT: 33.4 % — ABNORMAL LOW (ref 39.0–52.0)
Hemoglobin: 11.1 g/dL — ABNORMAL LOW (ref 13.0–17.0)
Immature Granulocytes: 0 %
Lymphocytes Relative: 4 %
Lymphs Abs: 0.6 10*3/uL — ABNORMAL LOW (ref 0.7–4.0)
MCH: 32.6 pg (ref 26.0–34.0)
MCHC: 33.2 g/dL (ref 30.0–36.0)
MCV: 98.2 fL (ref 80.0–100.0)
Monocytes Absolute: 0.3 10*3/uL (ref 0.1–1.0)
Monocytes Relative: 2 %
Neutro Abs: 12.2 10*3/uL — ABNORMAL HIGH (ref 1.7–7.7)
Neutrophils Relative %: 91 %
Platelets: 113 10*3/uL — ABNORMAL LOW (ref 150–400)
RBC: 3.4 MIL/uL — ABNORMAL LOW (ref 4.22–5.81)
RDW: 14.2 % (ref 11.5–15.5)
WBC: 13.6 10*3/uL — ABNORMAL HIGH (ref 4.0–10.5)
nRBC: 0 % (ref 0.0–0.2)

## 2022-05-06 LAB — C-REACTIVE PROTEIN: CRP: 15.1 mg/dL — ABNORMAL HIGH (ref <1.0)

## 2022-05-06 LAB — MAGNESIUM: Magnesium: 1.8 mg/dL (ref 1.7–2.4)

## 2022-05-06 LAB — PROTIME-INR
INR: 1.4 — ABNORMAL HIGH (ref 0.8–1.2)
Prothrombin Time: 17 seconds — ABNORMAL HIGH (ref 11.4–15.2)

## 2022-05-06 LAB — APTT: aPTT: 30 seconds (ref 24–36)

## 2022-05-06 LAB — MRSA NEXT GEN BY PCR, NASAL: MRSA by PCR Next Gen: NOT DETECTED

## 2022-05-06 LAB — CBG MONITORING, ED: Glucose-Capillary: 107 mg/dL — ABNORMAL HIGH (ref 70–99)

## 2022-05-06 MED ORDER — ASPIRIN 81 MG PO CHEW
81.0000 mg | CHEWABLE_TABLET | Freq: Every day | ORAL | Status: DC
  Administered 2022-05-06 – 2022-05-08 (×3): 81 mg via ORAL
  Filled 2022-05-06 (×3): qty 1

## 2022-05-06 MED ORDER — ONDANSETRON HCL 4 MG PO TABS: 4.0000 mg | ORAL_TABLET | Freq: Four times a day (QID) | ORAL | Status: DC | PRN

## 2022-05-06 MED ORDER — ENOXAPARIN SODIUM 40 MG/0.4ML IJ SOSY
40.0000 mg | PREFILLED_SYRINGE | INTRAMUSCULAR | Status: DC
  Administered 2022-05-06 – 2022-05-07 (×2): 40 mg via SUBCUTANEOUS
  Filled 2022-05-06 (×3): qty 0.4

## 2022-05-06 MED ORDER — ALPRAZOLAM 1 MG PO TABS
1.0000 mg | ORAL_TABLET | Freq: Two times a day (BID) | ORAL | Status: DC | PRN
  Administered 2022-05-06 – 2022-05-08 (×2): 1 mg via ORAL
  Filled 2022-05-06 (×2): qty 1

## 2022-05-06 MED ORDER — CEFTRIAXONE SODIUM 2 G IJ SOLR
2.0000 g | INTRAMUSCULAR | Status: DC
  Administered 2022-05-06 – 2022-05-07 (×2): 2 g via INTRAVENOUS
  Filled 2022-05-06 (×2): qty 20

## 2022-05-06 MED ORDER — ORAL CARE MOUTH RINSE: 15.0000 mL | OROMUCOSAL | Status: DC | PRN

## 2022-05-06 MED ORDER — LACTATED RINGERS IV SOLN: INTRAVENOUS | Status: AC

## 2022-05-06 MED ORDER — ONDANSETRON HCL 4 MG/2ML IJ SOLN
4.0000 mg | Freq: Four times a day (QID) | INTRAMUSCULAR | Status: DC | PRN
  Administered 2022-05-06: 4 mg via INTRAVENOUS
  Filled 2022-05-06: qty 2

## 2022-05-06 MED ORDER — ACETAMINOPHEN 650 MG RE SUPP: 650.0000 mg | Freq: Four times a day (QID) | RECTAL | Status: DC | PRN

## 2022-05-06 MED ORDER — ESCITALOPRAM OXALATE 20 MG PO TABS
20.0000 mg | ORAL_TABLET | Freq: Every day | ORAL | Status: DC
  Administered 2022-05-06 – 2022-05-07 (×3): 20 mg via ORAL
  Filled 2022-05-06: qty 1
  Filled 2022-05-06: qty 2
  Filled 2022-05-06: qty 1

## 2022-05-06 MED ORDER — METRONIDAZOLE 500 MG/100ML IV SOLN
500.0000 mg | Freq: Two times a day (BID) | INTRAVENOUS | Status: DC
  Administered 2022-05-06 (×2): 500 mg via INTRAVENOUS
  Filled 2022-05-06 (×2): qty 100

## 2022-05-06 MED ORDER — PANTOPRAZOLE SODIUM 20 MG PO TBEC
20.0000 mg | DELAYED_RELEASE_TABLET | Freq: Every day | ORAL | Status: DC
  Administered 2022-05-06 – 2022-05-08 (×3): 20 mg via ORAL
  Filled 2022-05-06 (×3): qty 1

## 2022-05-06 MED ORDER — ATORVASTATIN CALCIUM 40 MG PO TABS
40.0000 mg | ORAL_TABLET | Freq: Every day | ORAL | Status: DC
  Administered 2022-05-06 – 2022-05-08 (×3): 40 mg via ORAL
  Filled 2022-05-06 (×3): qty 1

## 2022-05-06 MED ORDER — HYDRALAZINE HCL 20 MG/ML IJ SOLN: 10.0000 mg | Freq: Four times a day (QID) | INTRAMUSCULAR | Status: DC | PRN

## 2022-05-06 MED ORDER — VANCOMYCIN HCL IN DEXTROSE 1-5 GM/200ML-% IV SOLN: 1000.0000 mg | INTRAVENOUS | Status: DC

## 2022-05-06 MED ORDER — SODIUM CHLORIDE 0.9 % IV SOLN
2.0000 g | Freq: Two times a day (BID) | INTRAVENOUS | Status: DC
  Administered 2022-05-06: 2 g via INTRAVENOUS
  Filled 2022-05-06: qty 12.5

## 2022-05-06 MED ORDER — POLYETHYLENE GLYCOL 3350 17 G PO PACK
17.0000 g | PACK | Freq: Every day | ORAL | Status: DC | PRN
Start: 2022-05-06 — End: 2022-05-08
  Administered 2022-05-08: 17 g via ORAL

## 2022-05-06 MED ORDER — CHLORHEXIDINE GLUCONATE CLOTH 2 % EX PADS
6.0000 | MEDICATED_PAD | Freq: Every day | CUTANEOUS | Status: DC
  Administered 2022-05-06 – 2022-05-08 (×3): 6 via TOPICAL

## 2022-05-06 MED ORDER — TAMSULOSIN HCL 0.4 MG PO CAPS
0.4000 mg | ORAL_CAPSULE | Freq: Every day | ORAL | Status: DC
  Administered 2022-05-06 – 2022-05-08 (×3): 0.4 mg via ORAL
  Filled 2022-05-06 (×3): qty 1

## 2022-05-06 MED ORDER — ACETAMINOPHEN 325 MG PO TABS: 650.0000 mg | ORAL_TABLET | Freq: Four times a day (QID) | ORAL | Status: DC | PRN

## 2022-05-06 NOTE — Assessment & Plan Note (Signed)
Continuing home regimen of daily PPI therapy.  

## 2022-05-06 NOTE — Progress Notes (Signed)
Transition of Care (TOC) Screening Note  Patient Details  Name: LOGEN HEINTZELMAN Date of Birth: Mar 28, 1941  Transition of Care [facility]) CM/SW Contact:    Sherie Don, LCSW Phone Number: 05/06/2022, 11:53 AM  Transition of Care Department [facility]) has reviewed patient and no TOC needs have been identified at this time. We will continue to monitor patient advancement through interdisciplinary progression rounds. If new patient transition needs arise, please place a TOC consult.

## 2022-05-06 NOTE — Progress Notes (Signed)
Pharmacy Antibiotic Note  Francisco [name] is a 81 y.o. male admitted on 05/05/2022 with sepsis.  In the ED, patient received Vancomycin 1gm IV, Cefepime 2gm IV and Metronidazole '500mg'$  IV x 1 dose each.  Pharmacy has been consulted for Vancomycin and Cefepime dosing.  Plan: Vancomycin 1000 mg IV Q 24 hrs. Goal AUC 400-550.  Expected AUC: 534.2  SCr used: 1.63 Cefepime 2gm IV q12h Metronidazole per MD Follow renal function F/u culture results & sensitivities  Height: '5\' 9"'$  (175.3 cm) Weight: 76.7 kg (169 lb) IBW/kg (Calculated) : 70.7  Temp (24hrs), Avg:101.3 F (38.5 C), Min:98.9 F (37.2 C), Max:103.9 F (39.9 C)  Recent Labs  Lab 05/05/22 1735 05/05/22 2100  WBC 7.0  --   CREATININE 1.63*  --   LATICACIDVEN 2.3* 1.7    Estimated Creatinine Clearance: 35.5 mL/min (A) (by C-G formula based on SCr of 1.63 mg/dL (H)).    Allergies  Allergen Reactions   Crestor [Rosuvastatin]    Lipitor [Atorvastatin]    Penicillins     REACTION: swelling/hives Has patient had a PCN reaction causing immediate rash, facial/tongue/throat swelling, SOB or lightheadedness with hypotension:yes Has patient had a PCN reaction causing severe rash involving mucus membranes or skin necrosis: Yes Has patient had a PCN reaction that required hospitalization No Has patient had a PCN reaction occurring within the last 10 years: No If all of the above answers are "NO", then may proceed with Cephalosporin use.    Symbicort [Budesonide-Formoterol Fumarate]     Pain  Behind ribs    Antimicrobials this admission: 6/20 Vancomycin >>   6/20 Cefepime >>   6/20 Metronidazole >.  Dose adjustments this admission:    Microbiology results: 6/20 BCx:      Thank you for allowing pharmacy to be a part of this patient's care.  Everette Rank, PharmD 05/06/2022 12:51 AM

## 2022-05-06 NOTE — Assessment & Plan Note (Signed)
.   Continuing home regimen of lipid lowering therapy.  

## 2022-05-06 NOTE — ED Notes (Signed)
  Patient called out complaining of being cold and shaky.  Checked temp and it was 98.9, checked CBG and it was 107.  Patients room is very cold so he was moved to another room across the hall that was warmer.  While packaging him up to move he started feeling nauseous and had two episodes of emesis.  Patient given PRN zofran and states he feels better at this time.

## 2022-05-06 NOTE — Progress Notes (Signed)
Patient seen and examined.  Denies any complaints at this time.  Admitted by nighttime hospitalist early morning hours with sepsis, source unknown and now growing E. coli bacteremia.  Hemodynamically stabilized.  He is eager to get home.  81 year old with history of GERD, hypertension, anxiety, hyperlipidemia, BPH presented with 24 hours of not feeling well, shaking chills and weakness.  In the emergency room blood pressure 88/50, temperature 103.1, lactic acid 2.3. CT scan of the chest and chest x-ray with no active findings.  COVID-19 and influenza negative.  Urinalysis was normal.  Blood cultures were drawn and started on broad-spectrum antibiotics.  Blood culture already growing E. coli.  Urine was clear on analysis.  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  LFTs are normal.  Plan: Antibiotics changed to Rocephin for E. coli bacteremia.  Continue maintenance IV fluids today. Check postvoid residual.  We will send for urine cultures even though urinalysis was normal. Check right upper quadrant ultrasound for any evidence of gallbladder thickening/recent evidence of cholangitis, however no clinical evidence. If all investigations negative for source of infection, will treat as UTI/prostatitis with antibiotics for 2 weeks.   Same-day admission.  No charge visit.

## 2022-05-06 NOTE — Assessment & Plan Note (Addendum)
   Mild acute thrombocytopenia likely secondary to underlying infectious process  No clinical evidence of bleeding  Treating underlying infection with broad-spectrum intravenous antibiotics as mentioned above  Monitoring platelet counts with serial CBCs

## 2022-05-06 NOTE — H&P (Signed)
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  This persisted and worsened throughout the day.  Patient also attests to a nonproductive cough but states this has been going on for several weeks.  Patient denies any chest pain or shortness of breath.  Patient denies any sick contacts, recent travel, concurrent contacts with COVID-19, abdominal pain, dysuria, low back pain, nausea, vomiting or diarrhea.    Symptoms of generalized weakness and malaise continue to worsen overnight.  The morning of 6/20 patient states that he continued to experience severe weakness and malaise.  Shortly after awakening he began to become extremely tremulous "to the point where he could not even use my phone."  Eventually in the early afternoon due to progressively worsening symptoms EMS was contacted.  Upon arrival, EMS noted that the patient's blood pressure was 88/50.  EMS gave the patient 500 cc normal saline bolus and brought patient to [facility] emergency department for evaluation.  Upon evaluation in the emergency department the patient exhibited substantial hypotension with systolic blood pressures as low as the 80s.  Patient additionally exhibited a markedly high fever of 103.1 F associated lactic  acidosis of 2.3.  Patient was initiated on broad-spectrum intravenous antibiotic therapy with intravenous vancomycin, Flagyl and cefepime.  The hospitalist group was then called to assess the patient for admission to the hospital.  Review of Systems: Review of Systems  Constitutional:  Positive for chills and malaise/fatigue.  Respiratory:  Positive for cough.   Musculoskeletal:  Positive for neck pain.  Neurological:  Positive for weakness.     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 His smoking use included cigarettes. He has never used smokeless tobacco. He reports current alcohol use of about 1.0 standard drink of alcohol per week. He reports that he does not use drugs.  Allergies  Allergen Reactions   Crestor [Rosuvastatin]    Lipitor [Atorvastatin]    Penicillins     REACTION: swelling/hives Has patient had a PCN reaction causing immediate rash, facial/tongue/throat swelling, SOB or lightheadedness with hypotension:yes Has patient had a PCN reaction causing severe rash involving mucus membranes or skin necrosis: Yes Has patient had a PCN reaction that required hospitalization No Has patient had a PCN reaction occurring within the last 10 years: No If all of the above answers are "NO", then may proceed with Cephalosporin use.    Symbicort [Budesonide-Formoterol Fumarate]  Pain  Behind ribs    Family History  Problem Relation Age of Onset   Healthy Mother    Transient ischemic attack Father    Rheum arthritis Daughter    Amblyopia Neg Hx    Blindness Neg Hx     Cataracts Neg Hx    Glaucoma Neg Hx    Macular degeneration Neg Hx    Retinal detachment Neg Hx    Strabismus Neg Hx    Retinitis pigmentosa Neg Hx     Prior to Admission medications   Medication Sig Start Date End Date Taking? Authorizing Provider  acetaminophen (TYLENOL) 325 MG tablet Take 650 mg by mouth every 6 (six) hours as needed for mild pain or fever.   Yes [provider]  ALPRAZolam Duanne Moron) 1 MG tablet Take 1 tablet (1 mg total) by mouth 2 (two) times daily as needed. Patient taking differently: Take 1 mg by mouth 2 (two) times daily as needed for anxiety or sleep. 11/21/21  Yes Hassell Done, Mary-Margaret, FNP  aspirin 81 MG tablet Take 81 mg by mouth daily.   Yes [provider]  escitalopram (LEXAPRO) 10 MG tablet Take 2 tablets (20 mg total) by mouth daily. 11/21/21  Yes Martin, Mary-Margaret, FNP  fish oil-omega-3 fatty acids 1000 MG capsule Take 2 g by mouth daily.   Yes [provider]  furosemide (LASIX) 20 MG tablet TAKE 1 TABLET BY MOUTH DAILY Patient taking differently: Take 20 mg by mouth daily. 03/23/22  Yes Martin, Mary-Margaret, FNP  meclizine (ANTIVERT) 25 MG tablet TAKE 1 TABLET BY MOUTH 3  TIMES DAILY AS NEEDED FOR  DIZZINESS Patient taking differently: Take 25 mg by mouth 3 (three) times daily as needed for dizziness. 03/02/22  Yes Martin, Mary-Margaret, FNP  meloxicam (MOBIC) 7.5 MG tablet TAKE 1 TABLET BY MOUTH  DAILY Patient taking differently: Take 7.5 mg by mouth daily. 10/11/21  Yes Hassell Done, Mary-Margaret, FNP  metoprolol succinate (TOPROL-XL) 25 MG 24 hr tablet Take 1 tablet (25 mg total) by mouth 2 (two) times daily. 05/04/22  Yes Park Liter, MD  pantoprazole (PROTONIX) 20 MG tablet TAKE 1 TABLET BY MOUTH DAILY Patient taking differently: Take 20 mg by mouth daily. 05/01/22  Yes Martin, Mary-Margaret, FNP  simvastatin (ZOCOR) 80 MG tablet Take 1 tablet (80 mg total) by mouth daily. 12/16/21  Yes Park Liter, MD  tamsulosin  (FLOMAX) 0.4 MG CAPS capsule TAKE 1 CAPSULE BY MOUTH DAILY Patient taking differently: Take 0.4 mg by mouth daily. 03/20/22  Yes [name], FNP    Physical Exam:  Vitals:   05/05/22 2300 05/05/22 2330 05/06/22 0000 05/06/22 0030  BP: 105/61 113/67 (!) 107/54 94/60  Pulse: 80 81 78 74  Resp: 19 18 (!) 21 17  Temp:  98.9 F (37.2 C)    TempSrc:  Oral    SpO2: 97% 98% 97% 98%  Weight:      Height:        Constitutional: Lethargic but arousable, oriented x3, no associated distress.   Skin: no rashes, no lesions, poor skin turgor noted. Eyes: Pupils are equally reactive to light.  No evidence of scleral icterus or conjunctival pallor.  ENMT: Dry mucous membranes noted.  Posterior pharynx clear of any exudate or lesions.   Neck: normal, supple, no masses, no thyromegaly.  No evidence of jugular venous distension.   Respiratory: clear to auscultation bilaterally, no wheezing, no crackles. Normal respiratory effort. No accessory muscle use.  Cardiovascular: Regular rate and rhythm,  no murmurs / rubs / gallops. No extremity edema. 2+ pedal pulses. No carotid bruits.  Chest:   Nontender without crepitus or deformity.   Back:   Nontender without crepitus or deformity. Abdomen: Abdomen is protuberant but soft and nontender.  No evidence of intra-abdominal masses.  Positive bowel sounds noted in all quadrants.   Musculoskeletal: No joint deformity upper and lower extremities. Good ROM, no contractures. Normal muscle tone.  Neurologic: CN 2-12 grossly intact. Sensation intact.  Patient moving all 4 extremities spontaneously.  Patient is following all commands.  Patient is responsive to verbal stimuli.   Psychiatric: Patient exhibits normal mood with appropriate affect.  Patient seems to possess insight as to their current situation.    Data Reviewed:  I have personally reviewed and interpreted labs, imaging.  Significant findings are:  Lactic acid 2.3 CBC revealing white blood  cell count 7.0 with hemoglobin of 11.9, hematocrit 34.1 and platelet count of 105 Procalcitonin 1.23.   Urinalysis unremarkable.   COVID-19 PCR testing negative. Chest x-ray personally reviewed revealing no evidence of cardiopulmonary disease. CT imaging of the abdomen pelvis revealed evidence of cholelithiasis with evidence of fatty liver and no other intra-abdominal acute process.  Telemetry: Personally reviewed.  Rhythm is normal sinus rhythm with heart rate of 90 bpm.     Assessment and Plan: * Severe sepsis with acute organ dysfunction [facility]) Patient presenting with 24-hour history of rapidly progressive malaise, generalized weakness and fever Presentation multiple SIRS criteria including fever and tachypnea with concurrent lactic acidosis and hypotension Lactic acidosis and pulm cytopenia suggestive of concurrent organ dysfunction Procalcitonin markedly elevated highly suggestive that this is a bacterial process and not a viral Organism at this time is unclear. CT imaging of the chest abdomen pelvis being performed to identify a specific source Hydrating patient aggressively with 30 cc/kg of isotonic fluids with additional boluses as necessary to maintain maps above 65 Broad-spectrum intravenous antibiotic therapy including cefepime, Flagyl and vancomycin for now Antibiotics will be de-escalated based on culture results Placing patient in stepdown unit due to tenuous blood pressures in case of potential need for vasopressors  Lactic acidosis Lactic acidosis likely secondary to sepsis with concurrent volume depletion Hydrate patient intravenous isotonic fluids while concurrently treating underlying infection Monitoring serial lactic acid levels to ensure downtrending and resolution.   Thrombocytopenia (HCC) Mild acute thrombocytopenia likely secondary to underlying infectious process No clinical evidence of bleeding Treating underlying infection with broad-spectrum intravenous  antibiotics as mentioned above Monitoring platelet counts with serial CBCs  Chronic kidney disease, stage 3a (HCC) Strict intake and output monitoring Creatinine near baseline (appoximately 1.4 two months ago) Minimizing nephrotoxic agents as much as possible Serial chemistries to monitor renal function and electrolytes   Essential hypertension Holding home regimen of oral antihypertensives considering hypotension We will resume home antihypertensives as blood pressure improves PRN intravenous antihypertensives for excessively elevated blood pressure    Mixed hyperlipidemia Continuing home regimen of lipid lowering therapy.   BPH (benign prostatic hyperplasia) Continue home regimen of Flomax  GERD without esophagitis Continuing home regimen of daily PPI therapy.        Code Status:  DNR  code status decision has been confirmed with: Patient Family Communication: Plan of care discussed with the daughter who is at bedside  Consults: None  Severity of Illness:  The appropriate patient status for this patient is INPATIENT. Inpatient status is judged to be reasonable and necessary in order to provide the required intensity of service to ensure  the patient's safety. The patient's presenting symptoms, physical exam findings, and initial radiographic and laboratory data in the context of their chronic comorbidities is felt to place them at high risk for further clinical deterioration. Furthermore, it is not anticipated that the patient will be medically stable for discharge from the hospital within 2 midnights of admission.   * I certify that at the point of admission it is my clinical judgment that the patient will require inpatient hospital care spanning beyond 2 midnights from the point of admission due to high intensity of service, high risk for further deterioration and high frequency of surveillance required.*  Author:  Vernelle Emerald MD  05/06/2022 12:45 AM

## 2022-05-06 NOTE — Sepsis Progress Note (Signed)
Following per sepsis protocol   

## 2022-05-06 NOTE — Assessment & Plan Note (Signed)
   Lactic acidosis likely secondary to sepsis with concurrent volume depletion  Hydrate patient intravenous isotonic fluids while concurrently treating underlying infection  Monitoring serial lactic acid levels to ensure downtrending and resolution.

## 2022-05-06 NOTE — Progress Notes (Signed)
PHARMACY - PHYSICIAN COMMUNICATION CRITICAL VALUE ALERT - BLOOD CULTURE IDENTIFICATION (BCID)  Francisco [name] is an 81 y.o. male who presented to [facility] on 05/05/2022 with sepsis.   Assessment:  Pt started on broad spectrum antibiotics on admission. BCID + 2/3 E.coli (no resistance detected)  Name of physician (or Provider) Contacted: Dr. Sloan Leiter  Current antibiotics: Vancomycin + Cefepime + metronidazole  Changes to prescribed antibiotics recommended: Recommended de-escalating to ceftriaxone 2 g IV q24h  Results for orders placed or performed during the hospital encounter of 05/05/22  Blood Culture ID Panel (Reflexed) (Collected: 05/05/2022  5:35 PM)  Result Value Ref Range   Enterococcus faecalis NOT DETECTED NOT DETECTED   Enterococcus Faecium NOT DETECTED NOT DETECTED   Listeria monocytogenes NOT DETECTED NOT DETECTED   Staphylococcus species NOT DETECTED NOT DETECTED   Staphylococcus aureus (BCID) NOT DETECTED NOT DETECTED   Staphylococcus epidermidis NOT DETECTED NOT DETECTED   Staphylococcus lugdunensis NOT DETECTED NOT DETECTED   Streptococcus species NOT DETECTED NOT DETECTED   Streptococcus agalactiae NOT DETECTED NOT DETECTED   Streptococcus pneumoniae NOT DETECTED NOT DETECTED   Streptococcus pyogenes NOT DETECTED NOT DETECTED   A.calcoaceticus-baumannii NOT DETECTED NOT DETECTED   Bacteroides fragilis NOT DETECTED NOT DETECTED   Enterobacterales DETECTED (A) NOT DETECTED   Enterobacter cloacae complex NOT DETECTED NOT DETECTED   Escherichia coli DETECTED (A) NOT DETECTED   Klebsiella aerogenes NOT DETECTED NOT DETECTED   Klebsiella oxytoca NOT DETECTED NOT DETECTED   Klebsiella pneumoniae NOT DETECTED NOT DETECTED   Proteus species NOT DETECTED NOT DETECTED   Salmonella species NOT DETECTED NOT DETECTED   Serratia marcescens NOT DETECTED NOT DETECTED   Haemophilus influenzae NOT DETECTED NOT DETECTED   Neisseria meningitidis NOT DETECTED NOT DETECTED    Pseudomonas aeruginosa NOT DETECTED NOT DETECTED   Stenotrophomonas maltophilia NOT DETECTED NOT DETECTED   Candida albicans NOT DETECTED NOT DETECTED   Candida auris NOT DETECTED NOT DETECTED   Candida glabrata NOT DETECTED NOT DETECTED   Candida krusei NOT DETECTED NOT DETECTED   Candida parapsilosis NOT DETECTED NOT DETECTED   Candida tropicalis NOT DETECTED NOT DETECTED   Cryptococcus neoformans/gattii NOT DETECTED NOT DETECTED   CTX-M ESBL NOT DETECTED NOT DETECTED   Carbapenem resistance IMP NOT DETECTED NOT DETECTED   Carbapenem resistance KPC NOT DETECTED NOT DETECTED   Carbapenem resistance NDM NOT DETECTED NOT DETECTED   Carbapenem resist OXA 48 LIKE NOT DETECTED NOT DETECTED   Carbapenem resistance VIM NOT DETECTED NOT [facility] [facility], PharmD 05/06/2022  9:11 AM

## 2022-05-06 NOTE — Assessment & Plan Note (Addendum)
   Patient presenting with 24-hour history of rapidly progressive malaise, generalized weakness and fever  Presentation multiple SIRS criteria including fever and tachypnea with concurrent lactic acidosis and hypotension  Lactic acidosis and pulm cytopenia suggestive of concurrent organ dysfunction  Procalcitonin markedly elevated highly suggestive that this is a bacterial process and not a viral  Organism at this time is unclear.  CT imaging of the chest abdomen pelvis being performed to identify a specific source  Hydrating patient aggressively with 30 cc/kg of isotonic fluids with additional boluses as necessary to maintain maps above 65  Broad-spectrum intravenous antibiotic therapy including cefepime, Flagyl and vancomycin for now  Antibiotics will be de-escalated based on culture results  Placing patient in stepdown unit due to tenuous blood pressures in case of potential need for vasopressors

## 2022-05-06 NOTE — Assessment & Plan Note (Signed)
   Continue home regimen of Flomax 

## 2022-05-06 NOTE — Assessment & Plan Note (Signed)
.   Holding home regimen of oral antihypertensives considering hypotension . We will resume home antihypertensives as blood pressure improves . PRN intravenous antihypertensives for excessively elevated blood pressure

## 2022-05-06 NOTE — Assessment & Plan Note (Signed)
.   Strict intake and output monitoring . Creatinine near baseline (appoximately 1.4 two months ago) . Minimizing nephrotoxic agents as much as possible . Serial chemistries to monitor renal function and electrolytes

## 2022-05-07 DIAGNOSIS — A419 Sepsis, unspecified organism: Secondary | ICD-10-CM | POA: Diagnosis not present

## 2022-05-07 DIAGNOSIS — R6520 Severe sepsis without septic shock: Secondary | ICD-10-CM | POA: Diagnosis not present

## 2022-05-07 LAB — CBC WITH DIFFERENTIAL/PLATELET
Abs Immature Granulocytes: 0.03 10*3/uL (ref 0.00–0.07)
Basophils Absolute: 0 10*3/uL (ref 0.0–0.1)
Basophils Relative: 0 %
Eosinophils Absolute: 0.1 10*3/uL (ref 0.0–0.5)
Eosinophils Relative: 1 %
HCT: 29.4 % — ABNORMAL LOW (ref 39.0–52.0)
Hemoglobin: 10 g/dL — ABNORMAL LOW (ref 13.0–17.0)
Immature Granulocytes: 0 %
Lymphocytes Relative: 14 %
Lymphs Abs: 1.2 10*3/uL (ref 0.7–4.0)
MCH: 32.6 pg (ref 26.0–34.0)
MCHC: 34 g/dL (ref 30.0–36.0)
MCV: 95.8 fL (ref 80.0–100.0)
Monocytes Absolute: 0.8 10*3/uL (ref 0.1–1.0)
Monocytes Relative: 9 %
Neutro Abs: 6.1 10*3/uL (ref 1.7–7.7)
Neutrophils Relative %: 76 %
Platelets: 85 10*3/uL — ABNORMAL LOW (ref 150–400)
RBC: 3.07 MIL/uL — ABNORMAL LOW (ref 4.22–5.81)
RDW: 14.1 % (ref 11.5–15.5)
WBC: 8.2 10*3/uL (ref 4.0–10.5)
nRBC: 0 % (ref 0.0–0.2)

## 2022-05-07 LAB — COMPREHENSIVE METABOLIC PANEL
ALT: 21 U/L (ref 0–44)
AST: 26 U/L (ref 15–41)
Albumin: 2.7 g/dL — ABNORMAL LOW (ref 3.5–5.0)
Alkaline Phosphatase: 35 U/L — ABNORMAL LOW (ref 38–126)
Anion gap: 5 (ref 5–15)
BUN: 23 mg/dL (ref 8–23)
CO2: 27 mmol/L (ref 22–32)
Calcium: 7.8 mg/dL — ABNORMAL LOW (ref 8.9–10.3)
Chloride: 108 mmol/L (ref 98–111)
Creatinine, Ser: 1.44 mg/dL — ABNORMAL HIGH (ref 0.61–1.24)
GFR, Estimated: 49 mL/min — ABNORMAL LOW (ref >60)
Glucose, Bld: 108 mg/dL — ABNORMAL HIGH (ref 70–99)
Potassium: 4 mmol/L (ref 3.5–5.1)
Sodium: 140 mmol/L (ref 135–145)
Total Bilirubin: 0.8 mg/dL (ref 0.3–1.2)
Total Protein: 5.1 g/dL — ABNORMAL LOW (ref 6.5–8.1)

## 2022-05-07 LAB — URINE CULTURE: Culture: NO GROWTH

## 2022-05-07 LAB — PHOSPHORUS: Phosphorus: 2.9 mg/dL (ref 2.5–4.6)

## 2022-05-07 LAB — MAGNESIUM: Magnesium: 1.9 mg/dL (ref 1.7–2.4)

## 2022-05-07 MED ORDER — METOPROLOL SUCCINATE ER 25 MG PO TB24
25.0000 mg | ORAL_TABLET | Freq: Two times a day (BID) | ORAL | Status: DC
  Administered 2022-05-07 – 2022-05-08 (×3): 25 mg via ORAL
  Filled 2022-05-07 (×3): qty 1

## 2022-05-07 NOTE — Progress Notes (Signed)
PROGRESS NOTE    Francisco [name]  RDE:[id number] DOB: 1941/02/06 DOA: 05/05/2022 PCP: [name], FNP    Brief Narrative:  81 year old with history of GERD, hypertension, anxiety, hyperlipidemia, BPH presented with 24 hours of not feeling well, shaking chills and weakness.  In the emergency room blood pressure 88/50, temperature 103.1, lactic acid 2.3. CT scan of the chest and chest x-ray with no active findings.  COVID-19 and influenza negative.  Urinalysis was normal.  Blood cultures were drawn and started on broad-spectrum antibiotics.  Blood culture already growing E. coli.  Urine was clear on analysis.  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  LFTs are normal. Patient does have a history of esophageal dysmotility and chronic hoarseness.   Assessment & Plan:   Sepsis present on admission secondary to E. coli bacteremia: Blood cultures growing E. coli.  Urine cultures no growth yet.  Urinalysis was normal. Right upper quadrant ultrasound with gallbladder stone, no clinical or radiological evidence of cholangitis. Blood pressures responded well. Discontinue IV fluids , continue Rocephin until final cultures reported.  Discontinue Flagyl. If no positive source of infection, will treat as complicated UTI/prostatitis with antibiotics for 2 weeks. No significant postvoid residuals.  Acute kidney injury on chronic kidney disease stage IIIa: Due to above.  Adequately improving.  Outputs are adequate.  We will monitor on antibiotics.  Currently off IV fluids.  Lasix on hold.  Recheck electrolytes tomorrow morning.  Essential hypertension: Blood pressure medications on hold.  Pressures are adequately improved today.  Will resume beta-blockers.  BPH: On Flomax.  No postvoid residual reported.  Esophageal dysmotility: On PPI.  Continue.  Soft, fine chopped food and aspiration precautions.  Mobilize.  Out of bed.  Can transfer to telemetry  bed.   DVT prophylaxis: enoxaparin (LOVENOX) injection 40 mg Start: 05/06/22 1000 SCDs Start: 05/06/22 0040   Code Status: Full code Family Communication: 2 daughters at bedside 6/21 Disposition Plan: Status is: Inpatient Remains inpatient appropriate because: Treated for sepsis, IV antibiotics and IV fluids     Consultants:  None  Procedures:  None  Antimicrobials:  Vancomycin, cefepime 6/20 Rocephin 6/21---   Subjective: Seen and examined.  No overnight events.  Not mobilized yet.  Blood pressures are adequate.  Afebrile last 24 hours.  Objective: Vitals:   05/07/22 0000 05/07/22 0200 05/07/22 0400 05/07/22 0600  BP: (!) 112/47 (!) 122/55 (!) 120/52 136/60  Pulse: (!) 57 (!) 56 (!) 59 (!) 56  Resp: '18 17 16 13  '$ Temp: 98.7 F (37.1 C)  98.1 F (36.7 C)   TempSrc: Oral  Axillary   SpO2: 96% 96% 96% 97%  Weight:      Height:        Intake/Output Summary (Last 24 hours) at 05/07/2022 0747 Last data filed at 05/07/2022 0400 Gross per 24 hour  Intake 2971.26 ml  Output 950 ml  Net 2021.26 ml   Filed Weights   05/05/22 1712  Weight: 76.7 kg    Examination:  General exam: Appears calm and comfortable  Respiratory system: Clear to auscultation. Respiratory effort normal. Cardiovascular system: S1 & S2 heard, RRR. No JVD, murmurs, rubs, gallops or clicks. No pedal edema. Gastrointestinal system: Abdomen is nondistended, soft and nontender. No organomegaly or masses felt. Normal bowel sounds heard. Central nervous system: Alert and oriented. No focal neurological deficits. Extremities: Symmetric 5 x 5 power. Skin: No rashes, lesions or ulcers Psychiatry: Judgement and insight appear normal. Mood & affect appropriate.  Data Reviewed: I have personally reviewed following labs and imaging studies  CBC: Recent Labs  Lab 05/05/22 1735 05/06/22 0558 05/07/22 0241  WBC 7.0 13.6* 8.2  NEUTROABS 6.7 12.2* 6.1  HGB 11.9* 11.1* 10.0*  HCT 35.1* 33.4* 29.4*   MCV 97.2 98.2 95.8  PLT 105* 113* 85*   Basic Metabolic Panel: Recent Labs  Lab 05/05/22 1735 05/06/22 0558 05/07/22 0241  NA 140 141 140  K 4.3 4.4 4.0  CL 106 108 108  CO2 '26 28 27  '$ GLUCOSE 107* 113* 108*  BUN 28* 25* 23  CREATININE 1.63* 1.57* 1.44*  CALCIUM 8.4* 8.3* 7.8*  MG  --  1.8 1.9  PHOS  --   --  2.9   GFR: Estimated Creatinine Clearance: 40.2 mL/min (A) (by C-G formula based on SCr of 1.44 mg/dL (H)). Liver Function Tests: Recent Labs  Lab 05/05/22 1735 05/06/22 0558 05/07/22 0241  AST '26 30 26  '$ ALT '22 26 21  '$ ALKPHOS 43 40 35*  BILITOT 1.2 1.4* 0.8  PROT 6.2* 5.6* 5.1*  ALBUMIN 3.6 3.3* 2.7*   No results for input(s): "LIPASE", "AMYLASE" in the last 168 hours. No results for input(s): "AMMONIA" in the last 168 hours. Coagulation Profile: Recent Labs  Lab 05/05/22 1735 05/06/22 0558  INR 1.1 1.4*   Cardiac Enzymes: No results for input(s): "CKTOTAL", "CKMB", "CKMBINDEX", "TROPONINI" in the last 168 hours. BNP (last 3 results) No results for input(s): "PROBNP" in the last 8760 hours. HbA1C: No results for input(s): "HGBA1C" in the last 72 hours. CBG: Recent Labs  Lab 05/06/22 0555  GLUCAP 107*   Lipid Profile: No results for input(s): "CHOL", "HDL", "LDLCALC", "TRIG", "CHOLHDL", "LDLDIRECT" in the last 72 hours. Thyroid Function Tests: No results for input(s): "TSH", "T4TOTAL", "FREET4", "T3FREE", "THYROIDAB" in the last 72 hours. Anemia Panel: No results for input(s): "VITAMINB12", "FOLATE", "FERRITIN", "TIBC", "IRON", "RETICCTPCT" in the last 72 hours. Sepsis Labs: Recent Labs  Lab 05/05/22 1735 05/05/22 2100  PROCALCITON 1.23  --   LATICACIDVEN 2.3* 1.7    Recent Results (from the past 240 hour(s))  Culture, blood (Routine x 2)     Status: Abnormal (Preliminary result)   Collection Time: 05/05/22  5:35 PM   Specimen: BLOOD  Result Value Ref Range Status   Specimen Description   Final    BLOOD BLOOD RIGHT FOREARM Performed  at [facility] [address], [place], [name] [address]    Special Requests   Final    BOTTLES DRAWN AEROBIC AND ANAEROBIC Blood Culture adequate volume Performed at [facility] [address] [place], [place] [address]    Culture  Setup Time   Final    GRAM NEGATIVE RODS IN BOTH AEROBIC AND ANAEROBIC BOTTLES CRITICAL RESULT CALLED TO, READ BACK BY AND VERIFIED WITH: Melodye Ped Gays, AT 6433 05/06/22 D. VANHOOK    Culture (A)  Final    ESCHERICHIA COLI SUSCEPTIBILITIES TO FOLLOW Performed at [facility], [place] [address], [place], [place] [address]    Report Status PENDING  Incomplete  Blood Culture ID Panel (Reflexed)     Status: Abnormal   Collection Time: 05/05/22  5:35 PM  Result Value Ref Range Status   Enterococcus faecalis NOT DETECTED NOT DETECTED Final   Enterococcus Faecium NOT DETECTED NOT DETECTED Final   Listeria monocytogenes NOT DETECTED NOT DETECTED Final   Staphylococcus species NOT DETECTED NOT DETECTED Final   Staphylococcus aureus (BCID) NOT DETECTED NOT DETECTED Final  Staphylococcus epidermidis NOT DETECTED NOT DETECTED Final   Staphylococcus lugdunensis NOT DETECTED NOT DETECTED Final   Streptococcus species NOT DETECTED NOT DETECTED Final   Streptococcus agalactiae NOT DETECTED NOT DETECTED Final   Streptococcus pneumoniae NOT DETECTED NOT DETECTED Final   Streptococcus pyogenes NOT DETECTED NOT DETECTED Final   A.calcoaceticus-baumannii NOT DETECTED NOT DETECTED Final   Bacteroides fragilis NOT DETECTED NOT DETECTED Final   Enterobacterales DETECTED (A) NOT DETECTED Final    Comment: Enterobacterales represent a large order of gram negative bacteria, not a single organism. CRITICAL RESULT CALLED TO, READ BACK BY AND VERIFIED WITH: Gaston, AT 0037 05/06/22 D. VANHOOK    Enterobacter cloacae complex NOT DETECTED NOT DETECTED Final   Escherichia coli DETECTED (A) NOT DETECTED Final    Comment:  CRITICAL RESULT CALLED TO, READ BACK BY AND VERIFIED WITHMelodye Ped PHARMD, AT 0488 05/06/22 D. VANHOOK    Klebsiella aerogenes NOT DETECTED NOT DETECTED Final   Klebsiella oxytoca NOT DETECTED NOT DETECTED Final   Klebsiella pneumoniae NOT DETECTED NOT DETECTED Final   Proteus species NOT DETECTED NOT DETECTED Final   Salmonella species NOT DETECTED NOT DETECTED Final   Serratia marcescens NOT DETECTED NOT DETECTED Final   Haemophilus influenzae NOT DETECTED NOT DETECTED Final   Neisseria meningitidis NOT DETECTED NOT DETECTED Final   Pseudomonas aeruginosa NOT DETECTED NOT DETECTED Final   Stenotrophomonas maltophilia NOT DETECTED NOT DETECTED Final   Candida albicans NOT DETECTED NOT DETECTED Final   Candida auris NOT DETECTED NOT DETECTED Final   Candida glabrata NOT DETECTED NOT DETECTED Final   Candida krusei NOT DETECTED NOT DETECTED Final   Candida parapsilosis NOT DETECTED NOT DETECTED Final   Candida tropicalis NOT DETECTED NOT DETECTED Final   Cryptococcus neoformans/gattii NOT DETECTED NOT DETECTED Final   CTX-M ESBL NOT DETECTED NOT DETECTED Final   Carbapenem resistance IMP NOT DETECTED NOT DETECTED Final   Carbapenem resistance KPC NOT DETECTED NOT DETECTED Final   Carbapenem resistance NDM NOT DETECTED NOT DETECTED Final   Carbapenem resist OXA 48 LIKE NOT DETECTED NOT DETECTED Final   Carbapenem resistance VIM NOT DETECTED NOT DETECTED Final    Comment: Performed at [facility], [address], [place], [place] [address]  Culture, blood (Routine x 2)     Status: None (Preliminary result)   Collection Time: 05/05/22  6:15 PM   Specimen: BLOOD  Result Value Ref Range Status   Specimen Description   Final    BLOOD BLOOD LEFT HAND Performed at [facility] [address], [place], [place] [address]    Special Requests   Final    BOTTLES DRAWN AEROBIC ONLY Blood Culture adequate volume Performed at [facility]  [address], [place], [place] [address]    Culture  Setup Time   Final    GRAM NEGATIVE RODS AEROBIC BOTTLE ONLY CRITICAL VALUE NOTED.  VALUE IS CONSISTENT WITH PREVIOUSLY REPORTED AND CALLED VALUE.    Culture   Final    GRAM NEGATIVE RODS IDENTIFICATION TO FOLLOW Performed at [facility], [place] [address], [place], [place] [address]    Report Status PENDING  Incomplete  Resp Panel by RT-PCR (Flu A&B, Covid) Anterior Nasal Swab     Status: None   Collection Time: 05/05/22  6:30 PM   Specimen: Anterior Nasal Swab  Result Value Ref Range Status   SARS Coronavirus 2 by RT PCR NEGATIVE NEGATIVE Final    Comment: (  NOTE) SARS-CoV-2 target nucleic acids are NOT DETECTED.  The SARS-CoV-2 RNA is generally detectable in upper respiratory specimens during the acute phase of infection. The lowest concentration of SARS-CoV-2 viral copies this assay can detect is 138 copies/mL. A negative result does not preclude SARS-Cov-2 infection and should not be used as the sole basis for treatment or other patient management decisions. A negative result may occur with  improper specimen collection/handling, submission of specimen other than nasopharyngeal swab, presence of viral mutation(s) within the areas targeted by this assay, and inadequate number of viral copies(<138 copies/mL). A negative result must be combined with clinical observations, patient history, and epidemiological information. The expected result is Negative.  Fact Sheet for Patients:  EntrepreneurPulse.com.au  Fact Sheet for Healthcare Providers:  IncredibleEmployment.be  This test is no t yet approved or cleared by the [place] FDA and  has been authorized for detection and/or diagnosis of SARS-CoV-2 by FDA under an Emergency Use Authorization (EUA). This EUA will remain  in effect (meaning this test can be used) for the duration of the COVID-19 declaration under Section 564(b)(1) of  the Act, 21 U.S.C.section 360bbb-3(b)(1), unless the authorization is terminated  or revoked sooner.       Influenza A by PCR NEGATIVE NEGATIVE Final   Influenza B by PCR NEGATIVE NEGATIVE Final    Comment: (NOTE) The Xpert Xpress SARS-CoV-2/FLU/RSV plus assay is intended as an aid in the diagnosis of influenza from Nasopharyngeal swab specimens and should not be used as a sole basis for treatment. Nasal washings and aspirates are unacceptable for Xpert Xpress SARS-CoV-2/FLU/RSV testing.  Fact Sheet for Patients: EntrepreneurPulse.com.au  Fact Sheet for Healthcare Providers: IncredibleEmployment.be  This test is not yet approved or cleared by the [place] FDA and has been authorized for detection and/or diagnosis of SARS-CoV-2 by FDA under an Emergency Use Authorization (EUA). This EUA will remain in effect (meaning this test can be used) for the duration of the COVID-19 declaration under Section 564(b)(1) of the Act, 21 U.S.C. section 360bbb-3(b)(1), unless the authorization is terminated or revoked.  Performed at [facility], [place] [address] [place], [place] [address]   Respiratory (~20 pathogens) panel by PCR     Status: None   Collection Time: 05/05/22  9:00 PM   Specimen: Nasopharyngeal Swab; Respiratory  Result Value Ref Range Status   Adenovirus NOT DETECTED NOT DETECTED Final   Coronavirus 229E NOT DETECTED NOT DETECTED Final    Comment: (NOTE) The Coronavirus on the Respiratory Panel, DOES NOT test for the novel  Coronavirus (2019 nCoV)    Coronavirus HKU1 NOT DETECTED NOT DETECTED Final   Coronavirus NL63 NOT DETECTED NOT DETECTED Final   Coronavirus OC43 NOT DETECTED NOT DETECTED Final   Metapneumovirus NOT DETECTED NOT DETECTED Final   Rhinovirus / Enterovirus NOT DETECTED NOT DETECTED Final   Influenza A NOT DETECTED NOT DETECTED Final   Influenza B NOT DETECTED NOT DETECTED Final   Parainfluenza  Virus 1 NOT DETECTED NOT DETECTED Final   Parainfluenza Virus 2 NOT DETECTED NOT DETECTED Final   Parainfluenza Virus 3 NOT DETECTED NOT DETECTED Final   Parainfluenza Virus 4 NOT DETECTED NOT DETECTED Final   Respiratory Syncytial Virus NOT DETECTED NOT DETECTED Final   Bordetella pertussis NOT DETECTED NOT DETECTED Final   Bordetella Parapertussis NOT DETECTED NOT DETECTED Final   Chlamydophila pneumoniae NOT DETECTED NOT DETECTED Final   Mycoplasma pneumoniae NOT DETECTED NOT DETECTED Final    Comment: Performed at [facility], [place] [address], [place], [place] [address]  MRSA Next Gen by PCR, Nasal     Status: None   Collection Time: 05/06/22  8:25 AM   Specimen: Nasal Mucosa; Nasal Swab  Result Value Ref Range Status   MRSA by PCR Next Gen NOT DETECTED NOT DETECTED Final    Comment: (NOTE) The GeneXpert MRSA Assay (FDA approved for NASAL specimens only), is one component of a comprehensive MRSA colonization surveillance program. It is not intended to diagnose MRSA infection nor to guide or monitor treatment for MRSA infections. Test performance is not FDA approved in patients less than 42 years old. Performed at [facility], [place] [address] [place], [place] [address]   Urine Culture     Status: None   Collection Time: 05/06/22 10:33 AM   Specimen: Urine, Clean Catch  Result Value Ref Range Status   Specimen Description   Final    URINE, CLEAN CATCH Performed at [facility], [place] [address], [place], [place] [address]    Special Requests   Final    NONE Performed at [facility], [place] [address] [place], [place] [address]    Culture   Final    NO GROWTH Performed at [facility], [place] [address], [place], [place] [address]    Report Status 05/07/2022 FINAL  Final         Radiology Studies: US Abdomen Limited RUQ (LIVER/GB)  Result Date: 05/06/2022 CLINICAL DATA:  [address], bacteremia. EXAM:  ULTRASOUND ABDOMEN LIMITED RIGHT UPPER QUADRANT COMPARISON:  Cm pelvic 05/05/2022 FINDINGS: Gallbladder: Gallstone noted within the gallbladder lumen. Gallbladder sludge within the gallbladder lumen. Non-mobile impacted gallbladder stone noted within the neck of the gallbladder. Gallbladder wall thickening is noted. No air no sonographic Murphy sign noted by sonographer. Common bile duct: Diameter: 6 mm. Liver: No focal lesion identified. Increased parenchymal echogenicity. Portal vein is patent on color Doppler imaging with normal direction of blood flow towards the liver. Other: None. IMPRESSION: 1. Cholelithiasis and gallbladder sludge with a gallbladder stone impacted within the gallbladder neck. Associated nonspecific gallbladder wall thickening. Negative sonographic Murphy sign. No definite pericholecystic fluid. Correlate clinically for acute cholecystitis. 2. Hepatic steatosis. Please note limited evaluation for focal hepatic masses in a patient with hepatic steatosis due to decreased penetration of the acoustic ultrasound waves. Electronically Signed   By: Iven Finn M.D.   On: 05/06/2022 19:22   CT Chest Wo Contrast  Result Date: 05/05/2022 CLINICAL DATA:  Difficulty breathing for 2 days with possible sepsis, initial encounter EXAM: CT CHEST WITHOUT CONTRAST TECHNIQUE: Multidetector CT imaging of the chest was performed following the standard protocol without IV contrast. RADIATION DOSE REDUCTION: This exam was performed according to the departmental dose-optimization program which includes automated exposure control, adjustment of the mA and/or kV according to patient size and/or use of iterative reconstruction technique. COMPARISON:  Chest x-ray from earlier in the same day. FINDINGS: Cardiovascular: Atherosclerotic calcifications of the aorta are noted. No aneurysmal dilatation is seen. No cardiac enlargement is noted. Coronary calcifications are seen. Pulmonary artery is not significantly  enlarged. Mediastinum/Nodes: Thoracic inlet is unremarkable. No hilar or mediastinal adenopathy is noted. The esophagus demonstrates some circumferential thickening as well as a small hiatal hernia. These changes may be related to reflux. Lungs/Pleura: Lungs are well aerated bilaterally. Scattered emphysematous changes are noted primarily within the apices. No focal infiltrate or sizable effusion is seen. No parenchymal nodules are seen. Upper Abdomen: Visualized  upper abdomen again demonstrates cholelithiasis similar to that seen on recent CT. Musculoskeletal: No rib abnormality is noted. Degenerative changes of the thoracic spine are seen. IMPRESSION: Cholelithiasis similar to that seen on recent CT of the abdomen. No acute intrathoracic abnormality is noted to correspond with the given clinical history. Aortic Atherosclerosis (ICD10-I70.0) and Emphysema (ICD10-J43.9). Electronically Signed   By: Inez Catalina M.D.   On: 05/05/2022 22:23   CT Abdomen Pelvis W Contrast  Result Date: 05/05/2022 CLINICAL DATA:  Acute abdominal pain with nausea and vomiting EXAM: CT ABDOMEN AND PELVIS WITH CONTRAST TECHNIQUE: Multidetector CT imaging of the abdomen and pelvis was performed using the standard protocol following bolus administration of intravenous contrast. RADIATION DOSE REDUCTION: This exam was performed according to the departmental dose-optimization program which includes automated exposure control, adjustment of the mA and/or kV according to patient size and/or use of iterative reconstruction technique. CONTRAST:  126m OMNIPAQUE IOHEXOL 300 MG/ML  SOLN COMPARISON:  Ultrasound from 02/14/2022 FINDINGS: Lower chest: No acute abnormality. Hepatobiliary: Fatty infiltration of the liver is noted. The gallbladder is well distended with multiple gallstones within. No wall thickening or pericholecystic fluid is noted. No in ductal dilatation is seen. Pancreas: Unremarkable. No pancreatic ductal dilatation or  surrounding inflammatory changes. Spleen: Normal in size without focal abnormality. Adrenals/Urinary Tract: Adrenal glands are within normal limits. Kidneys are well visualized bilaterally. Simple appearing renal cysts are seen bilaterally. The largest of these measures up to 5.5 cm in the left kidney. No follow-up is recommended. No calculi or obstructive changes are seen. The bladder is well distended. Stomach/Bowel: Appendix is well visualized and within normal limits. No obstructive or inflammatory changes of the colon are seen. Small bowel is within normal limits. Small hiatal hernia is seen. Vascular/Lymphatic: Aortic atherosclerosis. No enlarged abdominal or pelvic lymph nodes. Reproductive: Prostate is unremarkable. Other: No abdominal wall hernia or abnormality. No abdominopelvic ascites. Musculoskeletal: Degenerative changes of lumbar spine are seen. No acute abnormality noted. IMPRESSION: Cholelithiasis without complicating factors. Fatty liver. No acute abnormality is noted. Electronically Signed   By: MInez CatalinaM.D.   On: 05/05/2022 21:26   CT Head Wo Contrast  Result Date: 05/05/2022 CLINICAL DATA:  Dizziness EXAM: CT HEAD WITHOUT CONTRAST TECHNIQUE: Contiguous axial images were obtained from the base of the skull through the vertex without intravenous contrast. RADIATION DOSE REDUCTION: This exam was performed according to the departmental dose-optimization program which includes automated exposure control, adjustment of the mA and/or kV according to patient size and/or use of iterative reconstruction technique. COMPARISON:  09/08/2005 FINDINGS: Brain: No evidence of acute infarction, hemorrhage, hydrocephalus, extra-axial collection or mass lesion/mass effect. Vascular: No hyperdense vessel or unexpected calcification. Skull: Normal. Negative for fracture or focal lesion. Sinuses/Orbits: No acute finding. Other: None. IMPRESSION: No acute intracranial abnormality noted. Electronically Signed    By: MInez CatalinaM.D.   On: 05/05/2022 21:23   DG Chest 2 View  Result Date: 05/05/2022 CLINICAL DATA:  Suspected sepsis dizziness, weakness EXAM: CHEST - 2 VIEW COMPARISON:  Chest radiograph 02/14/2022 FINDINGS: The heart size is at the upper limits of normal for size. The upper mediastinal contours are normal. There is no focal consolidation or pulmonary edema. There is no pleural effusion or pneumothorax. There is no acute osseous abnormality. IMPRESSION: Stable chest with no radiographic evidence of acute cardiopulmonary process. Electronically Signed   By: PValetta MoleM.D.   On: 05/05/2022 18:09        Scheduled Meds:  aspirin  81  mg Oral Daily   atorvastatin  40 mg Oral Daily   Chlorhexidine Gluconate Cloth  6 each Topical Daily   enoxaparin (LOVENOX) injection  40 mg Subcutaneous Q24H   escitalopram  20 mg Oral QHS   metoprolol succinate  25 mg Oral BID   pantoprazole  20 mg Oral Daily   tamsulosin  0.4 mg Oral Daily   Continuous Infusions:  cefTRIAXone (ROCEPHIN)  IV Stopped (05/06/22 1857)     LOS: 2 days    Time spent: 35 minutes    Barb Merino, MD [facility] Pager [phone]

## 2022-05-07 NOTE — Plan of Care (Signed)

## 2022-05-08 LAB — BASIC METABOLIC PANEL
Anion gap: 6 (ref 5–15)
BUN: 21 mg/dL (ref 8–23)
CO2: 27 mmol/L (ref 22–32)
Calcium: 8.5 mg/dL — ABNORMAL LOW (ref 8.9–10.3)
Chloride: 109 mmol/L (ref 98–111)
Creatinine, Ser: 1.27 mg/dL — ABNORMAL HIGH (ref 0.61–1.24)
GFR, Estimated: 57 mL/min — ABNORMAL LOW (ref >60)
Glucose, Bld: 111 mg/dL — ABNORMAL HIGH (ref 70–99)
Potassium: 4.2 mmol/L (ref 3.5–5.1)
Sodium: 142 mmol/L (ref 135–145)

## 2022-05-08 LAB — CULTURE, BLOOD (ROUTINE X 2)
Special Requests: ADEQUATE
Special Requests: ADEQUATE

## 2022-05-08 MED ORDER — CEFADROXIL 1 G PO TABS: 1.0000 g | ORAL_TABLET | Freq: Two times a day (BID) | ORAL | 0 refills | Status: AC

## 2022-05-08 MED ORDER — CEFDINIR 300 MG PO CAPS
300.0000 mg | ORAL_CAPSULE | Freq: Two times a day (BID) | ORAL | 0 refills | Status: DC
Start: 2022-05-08 — End: 2022-05-08

## 2022-05-08 MED ORDER — CEFADROXIL 1 G PO TABS
1.0000 g | ORAL_TABLET | Freq: Two times a day (BID) | ORAL | 0 refills | Status: DC
Start: 2022-05-08 — End: 2022-05-08

## 2022-05-08 MED ORDER — SODIUM CHLORIDE 0.9 % IV SOLN: 2.0000 g | Freq: Once | INTRAVENOUS | Status: DC

## 2022-05-11 ENCOUNTER — Telehealth: Payer: Self-pay

## 2022-05-11 NOTE — Telephone Encounter (Signed)
Transition Care Management Follow-up Telephone Call Date of discharge and from where: 05/08/22 - [facility] - Sepsis How have you been since you were released from the hospital? Much better Any questions or concerns? No  Items Reviewed: Did the pt receive and understand the discharge instructions provided? Yes  Medications obtained and verified? Yes  Other? No  Any new allergies since your discharge? No  Dietary orders reviewed? Yes Do you have support at home? Yes   Home Care and Equipment/Supplies: Were home health services ordered? no  Were any new equipment or medical supplies ordered?  No  Functional Questionnaire: (I = Independent and D = Dependent) ADLs: I  Bathing/Dressing- I  Meal Prep- I  Eating- I  Maintaining continence- I  Transferring/Ambulation- I  Managing Meds- I  Follow up appointments reviewed:  PCP Hospital f/u appt confirmed? Yes  Already Scheduled to see Gennette Pac on 05/22/22 @ 11:30 - Would be better if patient was seen this week - his daughter, Lawanna Kobus is going to check with him and make sure he can come this Thursday and call back to confirm new appt Specialist Hospital f/u appt confirmed? No   Are transportation arrangements needed? No  If their condition worsens, is the pt aware to call PCP or go to the Emergency Dept.? Yes Was the patient provided with contact information for the PCP's office or ED? Yes Was to pt encouraged to call back with questions or concerns? Yes

## 2022-05-14 ENCOUNTER — Encounter: Payer: Self-pay | Admitting: Nurse Practitioner

## 2022-05-14 ENCOUNTER — Ambulatory Visit (INDEPENDENT_AMBULATORY_CARE_PROVIDER_SITE_OTHER): Payer: Medicare Other | Admitting: Nurse Practitioner

## 2022-05-14 VITALS — BP 155/66 | HR 58 | Temp 97.6°F | Resp 20 | Ht 69.0 in

## 2022-05-14 DIAGNOSIS — A4151 Sepsis due to Escherichia coli [E. coli]: Secondary | ICD-10-CM

## 2022-05-14 DIAGNOSIS — Z7689 Persons encountering health services in other specified circumstances: Secondary | ICD-10-CM

## 2022-05-14 NOTE — Progress Notes (Signed)
Subjective:    Patient ID: Francisco [name], male    DOB: 1941/05/25, 81 y.o.   MRN: [id number]   Chief Complaint: hospital follow and transition of care  HPI  Today's visit was for Transitional Care Management.  The patient was discharged from [facility] on 05/11/22 with a primary diagnosis of sepsis.   Contact with the patient and/or caregiver, by a clinical staff member, was made on 05/11/22 and was documented as a telephone encounter within the EMR.  Through chart review and discussion with the patient I have determined that management of their condition is of moderate complexity.   Since going home, patient says he still feels weak, but overall is better.   He has completed his antibiotic prescribed for home. No complaints today.     Review of Systems  Constitutional:  Positive for fatigue. Negative for chills and fever.  Respiratory:  Negative for cough, shortness of breath and wheezing.   Neurological:  Positive for weakness. Negative for dizziness.       Objective:   Physical Exam Vitals and nursing note reviewed.  Constitutional:      Appearance: Normal appearance. He is well-developed.  HENT:     Head: Normocephalic.     Nose: Nose normal.     Mouth/Throat:     Mouth: Mucous membranes are moist.     Pharynx: Oropharynx is clear.  Eyes:     Pupils: Pupils are equal, round, and reactive to light.  Neck:     Thyroid: No thyroid mass or thyromegaly.     Vascular: No carotid bruit or JVD.     Trachea: Phonation normal.  Cardiovascular:     Rate and Rhythm: Normal rate and regular rhythm.  Pulmonary:     Effort: Pulmonary effort is normal. No respiratory distress.     Breath sounds: Normal breath sounds.  Abdominal:     General: Bowel sounds are normal.     Palpations: Abdomen is soft.     Tenderness: There is no abdominal tenderness.  Musculoskeletal:        General: Normal range of motion.     Cervical back: Normal range of motion and neck supple.   Lymphadenopathy:     Cervical: No cervical adenopathy.  Skin:    General: Skin is warm and dry.  Neurological:     Mental Status: He is alert and oriented to person, place, and time.  Psychiatric:        Behavior: Behavior normal.        Thought Content: Thought content normal.        Judgment: Judgment normal.    BP (!) 155/66   Pulse (!) 58   Temp 97.6 F (36.4 C) (Temporal)   Resp 20   Ht '5\' 9"'$  (1.753 m)   BMI 24.96 kg/m         Assessment & Plan:   Francisco [name] in today with chief complaint of Hospitalization Follow-up   1. Encounter for support and coordination of transition of care  2. Sepsis due to Escherichia coli without acute organ dysfunction [facility]) Hospital records reviewed [facility] prn    The above assessment and management plan was discussed with the patient. The patient verbalized understanding of and has agreed to the management plan. Patient is aware to call the clinic if symptoms persist or worsen. Patient is aware when to return to the clinic for a follow-up visit. Patient educated on when it is appropriate to go to  the emergency department.   [name], FNP

## 2022-05-22 ENCOUNTER — Ambulatory Visit: Payer: Medicare Other | Admitting: Nurse Practitioner

## 2022-05-24 ENCOUNTER — Other Ambulatory Visit: Payer: Self-pay | Admitting: Nurse Practitioner

## 2022-05-24 DIAGNOSIS — F411 Generalized anxiety disorder: Secondary | ICD-10-CM

## 2022-06-04 ENCOUNTER — Other Ambulatory Visit: Payer: Self-pay | Admitting: Nurse Practitioner

## 2022-06-04 DIAGNOSIS — F411 Generalized anxiety disorder: Secondary | ICD-10-CM

## 2022-06-14 ENCOUNTER — Other Ambulatory Visit: Payer: Self-pay | Admitting: Nurse Practitioner

## 2022-06-14 DIAGNOSIS — I10 Essential (primary) hypertension: Secondary | ICD-10-CM

## 2022-06-24 ENCOUNTER — Other Ambulatory Visit: Payer: Self-pay | Admitting: Nurse Practitioner

## 2022-06-28 ENCOUNTER — Other Ambulatory Visit: Payer: Self-pay | Admitting: Nurse Practitioner

## 2022-06-28 DIAGNOSIS — F411 Generalized anxiety disorder: Secondary | ICD-10-CM

## 2022-07-05 ENCOUNTER — Inpatient Hospital Stay (HOSPITAL_COMMUNITY)
Admission: EM | Admit: 2022-07-05 | Discharge: 2022-07-14 | DRG: 418 | Disposition: A | Discharge status: Home Health | Payer: Medicare Other | Attending: Surgery | Admitting: Surgery

## 2022-07-05 ENCOUNTER — Other Ambulatory Visit: Payer: Self-pay

## 2022-07-05 ENCOUNTER — Encounter (HOSPITAL_COMMUNITY): Admission: EM | Disposition: A | Discharge status: Home Health | Payer: Self-pay | Source: Home / Self Care

## 2022-07-05 ENCOUNTER — Emergency Department (HOSPITAL_BASED_OUTPATIENT_CLINIC_OR_DEPARTMENT_OTHER): Payer: Medicare Other | Admitting: Certified Registered Nurse Anesthetist

## 2022-07-05 ENCOUNTER — Emergency Department (HOSPITAL_COMMUNITY): Payer: Medicare Other | Admitting: Certified Registered Nurse Anesthetist

## 2022-07-05 ENCOUNTER — Emergency Department (HOSPITAL_COMMUNITY): Payer: Medicare Other

## 2022-07-05 ENCOUNTER — Encounter (HOSPITAL_COMMUNITY): Payer: Self-pay

## 2022-07-05 DIAGNOSIS — R0602 Shortness of breath: Secondary | ICD-10-CM | POA: Diagnosis not present

## 2022-07-05 DIAGNOSIS — Z7982 Long term (current) use of aspirin: Secondary | ICD-10-CM

## 2022-07-05 DIAGNOSIS — Z880 Allergy status to penicillin: Secondary | ICD-10-CM | POA: Diagnosis not present

## 2022-07-05 DIAGNOSIS — R42 Dizziness and giddiness: Secondary | ICD-10-CM | POA: Diagnosis not present

## 2022-07-05 DIAGNOSIS — R32 Unspecified urinary incontinence: Secondary | ICD-10-CM | POA: Diagnosis present

## 2022-07-05 DIAGNOSIS — R739 Hyperglycemia, unspecified: Secondary | ICD-10-CM | POA: Diagnosis not present

## 2022-07-05 DIAGNOSIS — F411 Generalized anxiety disorder: Secondary | ICD-10-CM | POA: Diagnosis present

## 2022-07-05 DIAGNOSIS — K8070 Calculus of gallbladder and bile duct without cholecystitis without obstruction: Principal | ICD-10-CM

## 2022-07-05 DIAGNOSIS — Z20822 Contact with and (suspected) exposure to covid-19: Secondary | ICD-10-CM | POA: Diagnosis present

## 2022-07-05 DIAGNOSIS — E669 Obesity, unspecified: Secondary | ICD-10-CM | POA: Diagnosis present

## 2022-07-05 DIAGNOSIS — N1831 Chronic kidney disease, stage 3a: Secondary | ICD-10-CM | POA: Diagnosis present

## 2022-07-05 DIAGNOSIS — R7989 Other specified abnormal findings of blood chemistry: Secondary | ICD-10-CM | POA: Diagnosis present

## 2022-07-05 DIAGNOSIS — R110 Nausea: Secondary | ICD-10-CM | POA: Diagnosis not present

## 2022-07-05 DIAGNOSIS — Z888 Allergy status to other drugs, medicaments and biological substances status: Secondary | ICD-10-CM | POA: Diagnosis not present

## 2022-07-05 DIAGNOSIS — D631 Anemia in chronic kidney disease: Secondary | ICD-10-CM | POA: Diagnosis present

## 2022-07-05 DIAGNOSIS — K812 Acute cholecystitis with chronic cholecystitis: Secondary | ICD-10-CM | POA: Diagnosis not present

## 2022-07-05 DIAGNOSIS — Z87891 Personal history of nicotine dependence: Secondary | ICD-10-CM | POA: Diagnosis not present

## 2022-07-05 DIAGNOSIS — K219 Gastro-esophageal reflux disease without esophagitis: Secondary | ICD-10-CM | POA: Diagnosis not present

## 2022-07-05 DIAGNOSIS — E782 Mixed hyperlipidemia: Secondary | ICD-10-CM | POA: Diagnosis present

## 2022-07-05 DIAGNOSIS — K222 Esophageal obstruction: Secondary | ICD-10-CM | POA: Diagnosis not present

## 2022-07-05 DIAGNOSIS — I739 Peripheral vascular disease, unspecified: Secondary | ICD-10-CM | POA: Diagnosis not present

## 2022-07-05 DIAGNOSIS — R1011 Right upper quadrant pain: Secondary | ICD-10-CM | POA: Diagnosis not present

## 2022-07-05 DIAGNOSIS — Z79899 Other long term (current) drug therapy: Secondary | ICD-10-CM

## 2022-07-05 DIAGNOSIS — D649 Anemia, unspecified: Secondary | ICD-10-CM | POA: Diagnosis present

## 2022-07-05 DIAGNOSIS — K838 Other specified diseases of biliary tract: Secondary | ICD-10-CM

## 2022-07-05 DIAGNOSIS — K8000 Calculus of gallbladder with acute cholecystitis without obstruction: Secondary | ICD-10-CM

## 2022-07-05 DIAGNOSIS — Y838 Other surgical procedures as the cause of abnormal reaction of the patient, or of later complication, without mention of misadventure at the time of the procedure: Secondary | ICD-10-CM | POA: Diagnosis not present

## 2022-07-05 DIAGNOSIS — Z89022 Acquired absence of left finger(s): Secondary | ICD-10-CM | POA: Diagnosis not present

## 2022-07-05 DIAGNOSIS — R109 Unspecified abdominal pain: Secondary | ICD-10-CM | POA: Diagnosis not present

## 2022-07-05 DIAGNOSIS — I10 Essential (primary) hypertension: Secondary | ICD-10-CM | POA: Diagnosis not present

## 2022-07-05 DIAGNOSIS — K5909 Other constipation: Secondary | ICD-10-CM | POA: Diagnosis not present

## 2022-07-05 DIAGNOSIS — Z6831 Body mass index (BMI) 31.0-31.9, adult: Secondary | ICD-10-CM

## 2022-07-05 DIAGNOSIS — I129 Hypertensive chronic kidney disease with stage 1 through stage 4 chronic kidney disease, or unspecified chronic kidney disease: Secondary | ICD-10-CM | POA: Diagnosis present

## 2022-07-05 DIAGNOSIS — R079 Chest pain, unspecified: Secondary | ICD-10-CM | POA: Diagnosis not present

## 2022-07-05 DIAGNOSIS — K8066 Calculus of gallbladder and bile duct with acute and chronic cholecystitis without obstruction: Secondary | ICD-10-CM | POA: Diagnosis not present

## 2022-07-05 DIAGNOSIS — N400 Enlarged prostate without lower urinary tract symptoms: Secondary | ICD-10-CM | POA: Diagnosis present

## 2022-07-05 DIAGNOSIS — K8020 Calculus of gallbladder without cholecystitis without obstruction: Secondary | ICD-10-CM | POA: Diagnosis not present

## 2022-07-05 DIAGNOSIS — Z9889 Other specified postprocedural states: Secondary | ICD-10-CM | POA: Diagnosis not present

## 2022-07-05 DIAGNOSIS — K839 Disease of biliary tract, unspecified: Secondary | ICD-10-CM | POA: Diagnosis not present

## 2022-07-05 DIAGNOSIS — D696 Thrombocytopenia, unspecified: Secondary | ICD-10-CM | POA: Diagnosis present

## 2022-07-05 DIAGNOSIS — K9189 Other postprocedural complications and disorders of digestive system: Secondary | ICD-10-CM | POA: Diagnosis not present

## 2022-07-05 DIAGNOSIS — K5900 Constipation, unspecified: Secondary | ICD-10-CM

## 2022-07-05 DIAGNOSIS — E8809 Other disorders of plasma-protein metabolism, not elsewhere classified: Secondary | ICD-10-CM | POA: Diagnosis not present

## 2022-07-05 DIAGNOSIS — I700 Atherosclerosis of aorta: Secondary | ICD-10-CM | POA: Diagnosis not present

## 2022-07-05 HISTORY — PX: CHOLECYSTECTOMY: SHX55

## 2022-07-05 LAB — COMPREHENSIVE METABOLIC PANEL
ALT: 12 U/L (ref 0–44)
AST: 15 U/L (ref 15–41)
Albumin: 3.2 g/dL — ABNORMAL LOW (ref 3.5–5.0)
Alkaline Phosphatase: 30 U/L — ABNORMAL LOW (ref 38–126)
Anion gap: 6 (ref 5–15)
BUN: 26 mg/dL — ABNORMAL HIGH (ref 8–23)
CO2: 26 mmol/L (ref 22–32)
Calcium: 8.1 mg/dL — ABNORMAL LOW (ref 8.9–10.3)
Chloride: 109 mmol/L (ref 98–111)
Creatinine, Ser: 1.49 mg/dL — ABNORMAL HIGH (ref 0.61–1.24)
GFR, Estimated: 47 mL/min — ABNORMAL LOW (ref >60)
Glucose, Bld: 117 mg/dL — ABNORMAL HIGH (ref 70–99)
Potassium: 3.7 mmol/L (ref 3.5–5.1)
Sodium: 141 mmol/L (ref 135–145)
Total Bilirubin: 0.6 mg/dL (ref 0.3–1.2)
Total Protein: 5.1 g/dL — ABNORMAL LOW (ref 6.5–8.1)

## 2022-07-05 LAB — CBC WITH DIFFERENTIAL/PLATELET
Abs Immature Granulocytes: 0.02 10*3/uL (ref 0.00–0.07)
Basophils Absolute: 0 10*3/uL (ref 0.0–0.1)
Basophils Relative: 0 %
Eosinophils Absolute: 0.1 10*3/uL (ref 0.0–0.5)
Eosinophils Relative: 1 %
HCT: 34.2 % — ABNORMAL LOW (ref 39.0–52.0)
Hemoglobin: 11.5 g/dL — ABNORMAL LOW (ref 13.0–17.0)
Immature Granulocytes: 0 %
Lymphocytes Relative: 9 %
Lymphs Abs: 0.9 10*3/uL (ref 0.7–4.0)
MCH: 32.2 pg (ref 26.0–34.0)
MCHC: 33.6 g/dL (ref 30.0–36.0)
MCV: 95.8 fL (ref 80.0–100.0)
Monocytes Absolute: 0.3 10*3/uL (ref 0.1–1.0)
Monocytes Relative: 3 %
Neutro Abs: 8 10*3/uL — ABNORMAL HIGH (ref 1.7–7.7)
Neutrophils Relative %: 87 %
Platelets: 135 10*3/uL — ABNORMAL LOW (ref 150–400)
RBC: 3.57 MIL/uL — ABNORMAL LOW (ref 4.22–5.81)
RDW: 13.2 % (ref 11.5–15.5)
WBC: 9.3 10*3/uL (ref 4.0–10.5)
nRBC: 0 % (ref 0.0–0.2)

## 2022-07-05 LAB — PROTIME-INR
INR: 1.2 (ref 0.8–1.2)
Prothrombin Time: 14.7 seconds (ref 11.4–15.2)

## 2022-07-05 LAB — TROPONIN I (HIGH SENSITIVITY)
Troponin I (High Sensitivity): 3 ng/L (ref <18)
Troponin I (High Sensitivity): 3 ng/L (ref <18)

## 2022-07-05 LAB — APTT: aPTT: <20 seconds — ABNORMAL LOW (ref 24–36)

## 2022-07-05 LAB — RESP PANEL BY RT-PCR (FLU A&B, COVID) ARPGX2
Influenza A by PCR: NEGATIVE
Influenza B by PCR: NEGATIVE
SARS Coronavirus 2 by RT PCR: NEGATIVE

## 2022-07-05 LAB — LIPASE, BLOOD: Lipase: 32 U/L (ref 11–51)

## 2022-07-05 LAB — LACTIC ACID, PLASMA
Lactic Acid, Venous: 2.8 mmol/L (ref 0.5–1.9)
Lactic Acid, Venous: 1.2 mmol/L (ref 0.5–1.9)

## 2022-07-05 SURGERY — LAPAROSCOPIC CHOLECYSTECTOMY
Anesthesia: General | Site: Abdomen

## 2022-07-05 MED ORDER — PHENYLEPHRINE HCL (PRESSORS) 10 MG/ML IV SOLN
INTRAVENOUS | Status: AC
  Filled 2022-07-05: qty 1

## 2022-07-05 MED ORDER — DEXAMETHASONE SODIUM PHOSPHATE 10 MG/ML IJ SOLN
INTRAMUSCULAR | Status: DC | PRN
  Administered 2022-07-05: 5 mg via INTRAVENOUS

## 2022-07-05 MED ORDER — FENTANYL CITRATE (PF) 250 MCG/5ML IJ SOLN
INTRAMUSCULAR | Status: AC
  Filled 2022-07-05: qty 5

## 2022-07-05 MED ORDER — ASPIRIN 81 MG PO TBEC
81.0000 mg | DELAYED_RELEASE_TABLET | Freq: Every day | ORAL | Status: DC
  Administered 2022-07-06 – 2022-07-14 (×7): 81 mg via ORAL
  Filled 2022-07-05 (×7): qty 1

## 2022-07-05 MED ORDER — LIDOCAINE 2% (20 MG/ML) 5 ML SYRINGE
INTRAMUSCULAR | Status: DC | PRN
  Administered 2022-07-05: 100 mg via INTRAVENOUS

## 2022-07-05 MED ORDER — MECLIZINE HCL 25 MG PO TABS
25.0000 mg | ORAL_TABLET | Freq: Three times a day (TID) | ORAL | Status: DC | PRN
Start: 2022-07-05 — End: 2022-07-14

## 2022-07-05 MED ORDER — FENTANYL CITRATE (PF) 100 MCG/2ML IJ SOLN
INTRAMUSCULAR | Status: DC | PRN
  Administered 2022-07-05: 100 ug via INTRAVENOUS

## 2022-07-05 MED ORDER — 0.9 % SODIUM CHLORIDE (POUR BTL) OPTIME
TOPICAL | Status: DC | PRN
  Administered 2022-07-05: 1000 mL

## 2022-07-05 MED ORDER — SUCCINYLCHOLINE CHLORIDE 200 MG/10ML IV SOSY
PREFILLED_SYRINGE | INTRAVENOUS | Status: DC | PRN
  Administered 2022-07-05: 120 mg via INTRAVENOUS

## 2022-07-05 MED ORDER — ENOXAPARIN SODIUM 40 MG/0.4ML IJ SOSY
40.0000 mg | PREFILLED_SYRINGE | INTRAMUSCULAR | Status: DC
  Administered 2022-07-06 – 2022-07-07 (×2): 40 mg via SUBCUTANEOUS
  Filled 2022-07-05 (×3): qty 0.4

## 2022-07-05 MED ORDER — ONDANSETRON HCL 4 MG/2ML IJ SOLN
4.0000 mg | Freq: Once | INTRAMUSCULAR | Status: AC
  Administered 2022-07-05: 4 mg via INTRAVENOUS
  Filled 2022-07-05: qty 2

## 2022-07-05 MED ORDER — OXYCODONE HCL 5 MG/5ML PO SOLN: 5.0000 mg | Freq: Once | ORAL | Status: DC | PRN

## 2022-07-05 MED ORDER — METOPROLOL SUCCINATE ER 25 MG PO TB24
25.0000 mg | ORAL_TABLET | Freq: Two times a day (BID) | ORAL | Status: DC
  Administered 2022-07-06 – 2022-07-14 (×17): 25 mg via ORAL
  Filled 2022-07-05 (×17): qty 1

## 2022-07-05 MED ORDER — ACETAMINOPHEN 500 MG PO TABS
1000.0000 mg | ORAL_TABLET | Freq: Three times a day (TID) | ORAL | Status: DC
  Administered 2022-07-05 – 2022-07-14 (×25): 1000 mg via ORAL
  Filled 2022-07-05 (×25): qty 2

## 2022-07-05 MED ORDER — ALBUMIN HUMAN 5 % IV SOLN
INTRAVENOUS | Status: AC
  Filled 2022-07-05: qty 250

## 2022-07-05 MED ORDER — LACTATED RINGERS IV SOLN: INTRAVENOUS | Status: DC | PRN

## 2022-07-05 MED ORDER — METRONIDAZOLE 500 MG/100ML IV SOLN
500.0000 mg | Freq: Two times a day (BID) | INTRAVENOUS | Status: DC
  Administered 2022-07-05 – 2022-07-10 (×11): 500 mg via INTRAVENOUS
  Filled 2022-07-05 (×11): qty 100

## 2022-07-05 MED ORDER — ACETAMINOPHEN 10 MG/ML IV SOLN
1000.0000 mg | Freq: Once | INTRAVENOUS | Status: AC
  Administered 2022-07-05: 1000 mg via INTRAVENOUS

## 2022-07-05 MED ORDER — ACETAMINOPHEN 10 MG/ML IV SOLN
INTRAVENOUS | Status: AC
  Filled 2022-07-05: qty 100

## 2022-07-05 MED ORDER — PHENYLEPHRINE HCL-NACL 20-0.9 MG/250ML-% IV SOLN
INTRAVENOUS | Status: DC | PRN
  Administered 2022-07-05: 40 ug/min via INTRAVENOUS

## 2022-07-05 MED ORDER — EPHEDRINE SULFATE-NACL 50-0.9 MG/10ML-% IV SOSY
PREFILLED_SYRINGE | INTRAVENOUS | Status: DC | PRN
  Administered 2022-07-05: 5 mg via INTRAVENOUS
  Administered 2022-07-05: 15 mg via INTRAVENOUS

## 2022-07-05 MED ORDER — OXYCODONE HCL 5 MG PO TABS: 5.0000 mg | ORAL_TABLET | Freq: Once | ORAL | Status: DC | PRN

## 2022-07-05 MED ORDER — SODIUM CHLORIDE 0.9 % IV SOLN
INTRAVENOUS | Status: DC
Start: 2022-07-05 — End: 2022-07-06

## 2022-07-05 MED ORDER — ONDANSETRON HCL 4 MG/2ML IJ SOLN
4.0000 mg | Freq: Four times a day (QID) | INTRAMUSCULAR | Status: DC | PRN
  Administered 2022-07-08 – 2022-07-09 (×3): 4 mg via INTRAVENOUS
  Filled 2022-07-05 (×2): qty 2

## 2022-07-05 MED ORDER — PROPOFOL 10 MG/ML IV BOLUS
INTRAVENOUS | Status: DC | PRN
  Administered 2022-07-05: 120 mg via INTRAVENOUS

## 2022-07-05 MED ORDER — CHLORHEXIDINE GLUCONATE CLOTH 2 % EX PADS: 6.0000 | MEDICATED_PAD | Freq: Once | CUTANEOUS | Status: DC

## 2022-07-05 MED ORDER — PHENYLEPHRINE 80 MCG/ML (10ML) SYRINGE FOR IV PUSH (FOR BLOOD PRESSURE SUPPORT)
PREFILLED_SYRINGE | INTRAVENOUS | Status: DC | PRN
  Administered 2022-07-05 (×2): 240 ug via INTRAVENOUS
  Administered 2022-07-05: 160 ug via INTRAVENOUS

## 2022-07-05 MED ORDER — BUPIVACAINE HCL (PF) 0.5 % IJ SOLN
INTRAMUSCULAR | Status: DC | PRN
  Administered 2022-07-05: 9 mL
  Administered 2022-07-05: 21 mL

## 2022-07-05 MED ORDER — LACTATED RINGERS IV SOLN
INTRAVENOUS | Status: AC | PRN
  Administered 2022-07-05: 1000 mL

## 2022-07-05 MED ORDER — CEFEPIME HCL 2 G IV SOLR
2.0000 g | Freq: Once | INTRAVENOUS | Status: AC
  Administered 2022-07-05: 2 g via INTRAVENOUS
  Filled 2022-07-05: qty 12.5

## 2022-07-05 MED ORDER — ATORVASTATIN CALCIUM 40 MG PO TABS
40.0000 mg | ORAL_TABLET | Freq: Every day | ORAL | Status: DC
  Administered 2022-07-06 – 2022-07-14 (×9): 40 mg via ORAL
  Filled 2022-07-05 (×9): qty 1

## 2022-07-05 MED ORDER — HYDROMORPHONE HCL 2 MG/ML IJ SOLN
1.0000 mg | Freq: Once | INTRAMUSCULAR | Status: AC
  Administered 2022-07-05: 1 mg via INTRAVENOUS
  Filled 2022-07-05: qty 1

## 2022-07-05 MED ORDER — BUPIVACAINE HCL (PF) 0.5 % IJ SOLN
INTRAMUSCULAR | Status: AC
  Filled 2022-07-05: qty 30

## 2022-07-05 MED ORDER — FENTANYL CITRATE PF 50 MCG/ML IJ SOSY: 25.0000 ug | PREFILLED_SYRINGE | INTRAMUSCULAR | Status: DC | PRN

## 2022-07-05 MED ORDER — SODIUM CHLORIDE 0.9 % IV BOLUS (SEPSIS)
1000.0000 mL | Freq: Once | INTRAVENOUS | Status: AC
  Administered 2022-07-05: 1000 mL via INTRAVENOUS

## 2022-07-05 MED ORDER — POLYETHYLENE GLYCOL 3350 17 G PO PACK: 17.0000 g | PACK | Freq: Every day | ORAL | Status: DC | PRN

## 2022-07-05 MED ORDER — SUGAMMADEX SODIUM 200 MG/2ML IV SOLN
INTRAVENOUS | Status: DC | PRN
  Administered 2022-07-05: 200 mg via INTRAVENOUS

## 2022-07-05 MED ORDER — DOCUSATE SODIUM 100 MG PO CAPS
100.0000 mg | ORAL_CAPSULE | Freq: Two times a day (BID) | ORAL | Status: DC
  Administered 2022-07-06 – 2022-07-14 (×17): 100 mg via ORAL
  Filled 2022-07-05 (×16): qty 1

## 2022-07-05 MED ORDER — AMISULPRIDE (ANTIEMETIC) 5 MG/2ML IV SOLN: 10.0000 mg | Freq: Once | INTRAVENOUS | Status: DC | PRN

## 2022-07-05 MED ORDER — PANTOPRAZOLE SODIUM 20 MG PO TBEC
20.0000 mg | DELAYED_RELEASE_TABLET | Freq: Every day | ORAL | Status: DC
  Administered 2022-07-06 – 2022-07-14 (×8): 20 mg via ORAL
  Filled 2022-07-05 (×9): qty 1

## 2022-07-05 MED ORDER — LACTATED RINGERS IV SOLN: INTRAVENOUS | Status: DC

## 2022-07-05 MED ORDER — MORPHINE SULFATE (PF) 4 MG/ML IV SOLN
4.0000 mg | Freq: Once | INTRAVENOUS | Status: AC
  Administered 2022-07-05: 4 mg via INTRAVENOUS
  Filled 2022-07-05: qty 1

## 2022-07-05 MED ORDER — HYDROMORPHONE HCL 1 MG/ML IJ SOLN: 0.2000 mg | INTRAMUSCULAR | Status: DC | PRN

## 2022-07-05 MED ORDER — ONDANSETRON 4 MG PO TBDP
4.0000 mg | ORAL_TABLET | Freq: Four times a day (QID) | ORAL | Status: DC | PRN
  Administered 2022-07-07: 4 mg via ORAL
  Filled 2022-07-05: qty 1

## 2022-07-05 MED ORDER — ONDANSETRON HCL 4 MG/2ML IJ SOLN
INTRAMUSCULAR | Status: DC | PRN
  Administered 2022-07-05: 4 mg via INTRAVENOUS

## 2022-07-05 MED ORDER — TRAMADOL HCL 50 MG PO TABS
50.0000 mg | ORAL_TABLET | Freq: Three times a day (TID) | ORAL | Status: DC | PRN
  Administered 2022-07-05 – 2022-07-11 (×7): 50 mg via ORAL
  Filled 2022-07-05 (×7): qty 1

## 2022-07-05 MED ORDER — PROPOFOL 10 MG/ML IV BOLUS
INTRAVENOUS | Status: AC
  Filled 2022-07-05: qty 20

## 2022-07-05 MED ORDER — TAMSULOSIN HCL 0.4 MG PO CAPS
0.4000 mg | ORAL_CAPSULE | Freq: Every day | ORAL | Status: DC
  Administered 2022-07-06 – 2022-07-14 (×9): 0.4 mg via ORAL
  Filled 2022-07-05 (×9): qty 1

## 2022-07-05 MED ORDER — ESCITALOPRAM OXALATE 20 MG PO TABS
20.0000 mg | ORAL_TABLET | Freq: Every day | ORAL | Status: DC
  Administered 2022-07-06 – 2022-07-14 (×9): 20 mg via ORAL
  Filled 2022-07-05 (×9): qty 1

## 2022-07-05 MED ORDER — ALBUMIN HUMAN 5 % IV SOLN
12.5000 g | Freq: Once | INTRAVENOUS | Status: AC
  Administered 2022-07-05: 12.5 g via INTRAVENOUS

## 2022-07-05 MED ORDER — ROCURONIUM BROMIDE 10 MG/ML (PF) SYRINGE
PREFILLED_SYRINGE | INTRAVENOUS | Status: DC | PRN
  Administered 2022-07-05: 40 mg via INTRAVENOUS
  Administered 2022-07-05: 10 mg via INTRAVENOUS

## 2022-07-05 MED ORDER — TRAMADOL HCL 50 MG PO TABS
50.0000 mg | ORAL_TABLET | Freq: Four times a day (QID) | ORAL | Status: DC | PRN
Start: 2022-07-05 — End: 2022-07-05

## 2022-07-05 SURGICAL SUPPLY — 56 items
ADH SKN CLS APL DERMABOND .7 (GAUZE/BANDAGES/DRESSINGS) ×1
APL PRP STRL LF DISP 70% ISPRP (MISCELLANEOUS) ×1
APPLIER CLIP 5 13 M/L LIGAMAX5 (MISCELLANEOUS) ×1
APR CLP MED LRG 5 ANG JAW (MISCELLANEOUS) ×1
BAG COUNTER SPONGE SURGICOUNT (BAG) IMPLANT
BAG SPEC RTRVL 10 TROC 200 (ENDOMECHANICALS)
BAG SPNG CNTER NS LX DISP (BAG)
CHLORAPREP W/TINT 26 (MISCELLANEOUS) ×1 IMPLANT
CLIP APPLIE 5 13 M/L LIGAMAX5 (MISCELLANEOUS) ×1 IMPLANT
COVER MAYO STAND XLG (MISCELLANEOUS) ×1 IMPLANT
COVER SURGICAL LIGHT HANDLE (MISCELLANEOUS) ×1 IMPLANT
DERMABOND ADVANCED (GAUZE/BANDAGES/DRESSINGS) ×1
DERMABOND ADVANCED .7 DNX12 (GAUZE/BANDAGES/DRESSINGS) ×1 IMPLANT
DRAIN CHANNEL 19F RND (DRAIN) IMPLANT
DRAPE C-ARM 42X120 X-RAY (DRAPES) IMPLANT
DRSG TEGADERM 4X4.75 (GAUZE/BANDAGES/DRESSINGS) IMPLANT
ELECT L-HOOK LAP 45CM DISP (ELECTROSURGICAL)
ELECT PENCIL ROCKER SW 15FT (MISCELLANEOUS) ×1 IMPLANT
ELECT REM PT RETURN 15FT ADLT (MISCELLANEOUS) ×1 IMPLANT
ELECTRODE L-HOOK LAP 45CM DISP (ELECTROSURGICAL) IMPLANT
EVACUATOR SILICONE 100CC (DRAIN) IMPLANT
GLOVE BIOGEL PI IND STRL 6 (GLOVE) ×1 IMPLANT
GLOVE BIOGEL PI INDICATOR 6 (GLOVE) ×1
GLOVE BIOGEL PI MICRO 5.5 (GLOVE) ×1
GLOVE BIOGEL PI MICRO STRL 5.5 (GLOVE) ×1 IMPLANT
GOWN STRL REUS W/ TWL LRG LVL3 (GOWN DISPOSABLE) ×1 IMPLANT
GOWN STRL REUS W/TWL LRG LVL3 (GOWN DISPOSABLE) ×1
GRASPER SUT TROCAR 14GX15 (MISCELLANEOUS) IMPLANT
HEMOSTAT SNOW SURGICEL 2X4 (HEMOSTASIS) IMPLANT
IRRIG SUCT STRYKERFLOW 2 WTIP (MISCELLANEOUS) ×1
IRRIGATION SUCT STRKRFLW 2 WTP (MISCELLANEOUS) ×1 IMPLANT
KIT BASIN OR (CUSTOM PROCEDURE TRAY) ×1 IMPLANT
KIT TURNOVER KIT A (KITS) IMPLANT
L-HOOK LAP DISP 36CM (ELECTROSURGICAL) ×1
LHOOK LAP DISP 36CM (ELECTROSURGICAL) ×1 IMPLANT
NDL INSUFFLATION 14GA 120MM (NEEDLE) IMPLANT
NEEDLE INSUFFLATION 14GA 120MM (NEEDLE) IMPLANT
POUCH RETRIEVAL ECOSAC 10 (ENDOMECHANICALS) IMPLANT
POUCH RETRIEVAL ECOSAC 10MM (ENDOMECHANICALS)
SCISSORS LAP 5X35 DISP (ENDOMECHANICALS) ×1 IMPLANT
SET CHOLANGIOGRAPH MIX (MISCELLANEOUS) IMPLANT
SET TUBE SMOKE EVAC HIGH FLOW (TUBING) ×1 IMPLANT
SLEEVE Z-THREAD 5X100MM (TROCAR) ×2 IMPLANT
SPIKE FLUID TRANSFER (MISCELLANEOUS) ×1 IMPLANT
SPONGE DRAIN TRACH 4X4 STRL 2S (GAUZE/BANDAGES/DRESSINGS) IMPLANT
SUT ETHILON 2 0 PS N (SUTURE) IMPLANT
SUT MNCRL AB 4-0 PS2 18 (SUTURE) ×1 IMPLANT
SUT VICRYL 0 UR6 27IN ABS (SUTURE) IMPLANT
SYS BAG RETRIEVAL 10MM (BASKET)
SYSTEM BAG RETRIEVAL 10MM (BASKET) IMPLANT
TOWEL OR 17X26 10 PK STRL BLUE (TOWEL DISPOSABLE) ×1 IMPLANT
TOWEL OR NON WOVEN STRL DISP B (DISPOSABLE) IMPLANT
TRAY LAPAROSCOPIC (CUSTOM PROCEDURE TRAY) ×1 IMPLANT
TROCAR ADV FIXATION 12X100MM (TROCAR) IMPLANT
TROCAR BALLN 12MMX100 BLUNT (TROCAR) IMPLANT
TROCAR Z-THREAD OPTICAL 5X100M (TROCAR) ×1 IMPLANT

## 2022-07-05 NOTE — Anesthesia Procedure Notes (Signed)
Procedure Name: Intubation Date/Time: 07/05/2022 2:11 PM  Performed by: Gean Maidens, CRNAPre-anesthesia Checklist: Patient identified, Emergency Drugs available, Suction available, Patient being monitored and Timeout performed Patient Re-evaluated:Patient Re-evaluated prior to induction Oxygen Delivery Method: Circle system utilized Preoxygenation: Pre-oxygenation with 100% oxygen Induction Type: IV induction and Rapid sequence Laryngoscope Size: Mac and 4 Grade View: Grade II Tube type: Oral Tube size: 7.5 mm Number of attempts: 1 Airway Equipment and Method: Stylet Placement Confirmation: ETT inserted through vocal cords under direct vision, positive ETCO2 and breath sounds checked- equal and bilateral Secured at: 23 cm Tube secured with: Tape Dental Injury: Teeth and Oropharynx as per pre-operative assessment

## 2022-07-05 NOTE — Plan of Care (Signed)
  Problem: Education: Goal: Knowledge of General Education information will improve Description: Including pain rating scale, medication(s)/side effects and non-pharmacologic comfort measures Outcome: Progressing   Problem: Activity: Goal: Risk for activity intolerance will decrease Outcome: Progressing   Problem: Pain Managment: Goal: General experience of comfort will improve Outcome: Progressing   

## 2022-07-05 NOTE — H&P (Addendum)
Francisco [name] Jun 28, 1941  [id number].    Requesting MD: Paulita Cradle, PA-C Chief Complaint/Reason for Consult: gallstones  HPI:  Francisco [name] is an 81 yo male who presented to the ED today with RUQ abdominal pain. The pain began around 6am this morning and has persisted. He has also had nausea and vomiting. He has not eaten anything today. He denies fevers but has had chills. Labs in the ED, including LFTs, WBC, and lipase, are normal. A RUQ US showed multiple large gallstones without signs of acute cholecystitis. The patient has been treated for pain but has continued to have symptoms and rigors, so general surgery was consulted to consider cholecystectomy.  Of note, he was admitted in June with Gram-negative bacteremia (E coli). No source was identified and he was treated with antibiotics, however a RUQ [place] at that time did show cholelithiasis with a large stone in the neck of the gallbladder. He says he has known for many years he had gallstones but did not previously have symptoms. I spoke with his daughter via phone, who reports he has recently had several episodes of RUQ pain.  The patient has a history of CKD and creatinine is at his baseline of 1.4 today. He has no known cardiopulmonary disease and had an echo in February of this year that showed a normal EF.  ROS: Review of Systems  Constitutional:  Positive for chills and malaise/fatigue. Negative for fever.  Respiratory:  Negative for shortness of breath and wheezing.   Cardiovascular:  Negative for chest pain.  Gastrointestinal:  Positive for abdominal pain, nausea and vomiting.  Musculoskeletal:  Negative for back pain.  Neurological:  Negative for dizziness.    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 His smoking use included cigarettes. He has never used smokeless tobacco. He reports current alcohol use of about 1.0 standard drink of alcohol per week. He reports that he does not use drugs.  Allergies:  Allergies  Allergen Reactions   Crestor [Rosuvastatin]    Lipitor [Atorvastatin]    Penicillins     REACTION: swelling/hives Has patient had a PCN reaction causing immediate rash, facial/tongue/throat swelling, SOB or lightheadedness with hypotension:yes Has patient had a PCN reaction causing severe rash involving mucus membranes or skin necrosis: Yes Has patient had a PCN reaction that required hospitalization No Has patient had a PCN reaction occurring within the last 10 years: No If all of the above answers are "NO", then may proceed with Cephalosporin use.    Symbicort [Budesonide-Formoterol Fumarate]     Pain  Behind ribs    (  Not in a hospital admission)    Physical Exam: Blood pressure 116/61, pulse 73, temperature 97.8 F (36.6 C), resp. rate 19, SpO2 94 %. General: resting comfortably, NAD Neuro: alert and oriented, no focal deficits Resp: normal work of breathing on 2L nasal cannula CV: RRR Abdomen: soft, nondistended, mildly tender in RUQ, no rebound tenderness or guarding Extremities: warm and well-perfused   Results for orders placed or performed during the hospital encounter of 07/05/22 (from the past 48 hour(s))  CBC with Differential     Status:  Abnormal   Collection Time: 07/05/22 10:49 AM  Result Value Ref Range   WBC 9.3 4.0 - 10.5 K/uL   RBC 3.57 (L) 4.22 - 5.81 MIL/uL   Hemoglobin 11.5 (L) 13.0 - 17.0 g/dL   HCT 34.2 (L) 39.0 - 52.0 %   MCV 95.8 80.0 - 100.0 fL   MCH 32.2 26.0 - 34.0 pg   MCHC 33.6 30.0 - 36.0 g/dL   RDW 13.2 11.5 - 15.5 %   Platelets 135 (L) 150 - 400 K/uL   nRBC 0.0 0.0 - 0.2 %   Neutrophils Relative % 87 %   Neutro Abs 8.0 (H) 1.7 - 7.7 K/uL   Lymphocytes Relative 9 %   Lymphs Abs 0.9 0.7 - 4.0 K/uL   Monocytes Relative 3 %   Monocytes Absolute 0.3 0.1 - 1.0 K/uL   Eosinophils Relative 1 %   Eosinophils Absolute 0.1 0.0 - 0.5 K/uL   Basophils Relative 0 %   Basophils Absolute 0.0 0.0 - 0.1 K/uL   Immature Granulocytes 0 %   Abs Immature Granulocytes 0.02 0.00 - 0.07 K/uL    Comment: Performed at [facility], [place] [address], [place], [place] [address]  Comprehensive metabolic panel     Status: Abnormal   Collection Time: 07/05/22 10:49 AM  Result Value Ref Range   Sodium 141 135 - 145 mmol/L   Potassium 3.7 3.5 - 5.1 mmol/L   Chloride 109 98 - 111 mmol/L   CO2 26 22 - 32 mmol/L   Glucose, Bld 117 (H) 70 - 99 mg/dL    Comment: Glucose reference range applies only to samples taken after fasting for at least 8 hours.   BUN 26 (H) 8 - 23 mg/dL   Creatinine, Ser 1.49 (H) 0.61 - 1.24 mg/dL   Calcium 8.1 (L) 8.9 - 10.3 mg/dL   Total Protein 5.1 (L) 6.5 - 8.1 g/dL   Albumin 3.2 (L) 3.5 - 5.0 g/dL   AST 15 15 - 41 U/L   ALT 12 0 - 44 U/L   Alkaline Phosphatase 30 (L) 38 - 126 U/L   Total Bilirubin 0.6 0.3 - 1.2 mg/dL   GFR, Estimated 47 (L) >60 mL/min    Comment: (NOTE) Calculated using the CKD-EPI Creatinine Equation (2021)    Anion gap 6 5 - 15    Comment: Performed at [facility], [place] [address], [place], [place] [address]  Lipase, blood     Status: None   Collection Time: 07/05/22 10:49 AM  Result Value Ref Range   Lipase 32 11 - 51 U/L     Comment: Performed at [facility], [place] [address] [place], [place] [address]  Troponin I (High Sensitivity)     Status: None   Collection Time: 07/05/22 10:49 AM  Result Value Ref Range   Troponin I (High Sensitivity) 3 <18 ng/L    Comment: (NOTE) Elevated high sensitivity troponin I (  hsTnI) values and significant  changes across serial measurements may suggest ACS but many other  chronic and acute conditions are known to elevate hsTnI results.  Refer to the "Links" section for chest pain algorithms and additional  guidance. Performed at [facility], [place] [address], [place], [place] [address]   Protime-INR     Status: None   Collection Time: 07/05/22 12:32 PM  Result Value Ref Range   Prothrombin Time 14.7 11.4 - 15.2 seconds   INR 1.2 0.8 - 1.2    Comment: (NOTE) INR goal varies based on device and disease states. Performed at [facility], [place] [address] [place], [place] [address]    US Abdomen Limited RUQ (LIVER/GB)  Result Date: 07/05/2022 CLINICAL DATA:  Right upper quadrant pain EXAM: ULTRASOUND ABDOMEN LIMITED RIGHT UPPER QUADRANT COMPARISON:  None Available. FINDINGS: Gallbladder: Cholelithiasis with the largest gallstone measuring 2.4 cm. No pericholecystic fluid. Top-normal gallbladder wall thickness. Negative sonographic Murphy sign. Common bile duct: Diameter: 5 mm Liver: No focal lesion identified. Within normal limits in parenchymal echogenicity. Portal vein is patent on color Doppler imaging with normal direction of blood flow towards the liver. Other: None. IMPRESSION: 1. Cholelithiasis without sonographic evidence of acute cholecystitis. Electronically Signed   By: Kathreen Devoid M.D.   On: 07/05/2022 11:24   DG Chest 2 View  Result Date: 07/05/2022 CLINICAL DATA:  Pleuritic chest pain. EXAM: CHEST - 2 VIEW COMPARISON:  05/05/2022 FINDINGS: The heart size and mediastinal contours are unremarkable. There is no  pleural effusion or edema. No airspace opacities identified. Signs of previous ACDF within the cervical spine. IMPRESSION: No active cardiopulmonary abnormalities. Electronically Signed   By: Kerby Moors M.D.   On: 07/05/2022 11:12      Assessment/Plan This is an 81 yo male presenting with severe RUQ pain, which has persisted despite symptomatic treatment. I have reviewed his previous imaging and his [place] today, which shows multiple large stones in the gallbladder. He does not have signs of acute cholecystitis, but given the persistence and severity of his symptoms, with a stone possibly lodged in the neck of the gallbladder, I recommended cholecystectomy. It is possible his recent episode of E coli bacteremia was related to his gallbladder. The details of laparoscopic cholecystectomy were reviewed and the patient agrees to proceed with surgery. He will likely be admitted for overnight observation postoperatively. I also discussed the plan of care with his daughter Levada Dy via phone, and she is agreeable to him proceeding with surgery.  Patient noted to desat to 80% on room air during my exam, and was very somnolent but arousable. Sats improved on 2L nasal cannula. CXR was clear, and he had very recently received '1mg'$  of dilaudid prior to my exam. I suspect this is secondary to narcotic administration and not an underlying respiratory issue.   Michaelle Birks, MD [facility] General, Hepatobiliary and Pancreatic Surgery 07/05/22 1:12 PM

## 2022-07-05 NOTE — Progress Notes (Signed)
I called patient's daughter [name] postop to update her and clarify code status. Patient previously had a DNR order during admission in June. His daughter stated that he previously signed a DNR order, but said that although he would not want prolonged life support, he does want CPR, shocks, intubation, etc initially if he were to have a cardiac arrest. I clarified with her what DNR vs full code means, and she would like to keep him full code for now. We will revisit this with the patient tomorrow morning when he is fully awake from anesthesia to confirm he wants a full code status.

## 2022-07-05 NOTE — Plan of Care (Signed)

## 2022-07-05 NOTE — Anesthesia Preprocedure Evaluation (Signed)
Anesthesia Evaluation  Patient identified by MRN, date of birth, ID band Patient awake    Reviewed: Allergy & Precautions, NPO status , Patient's Chart, lab work & pertinent test results, reviewed documented beta blocker date and time   History of Anesthesia Complications Negative for: history of anesthetic complications  Airway Mallampati: II  TM Distance: >3 FB Neck ROM: Full    Dental  (+) Missing,    Pulmonary asthma , former smoker,    Pulmonary exam normal        Cardiovascular hypertension, Pt. on home beta blockers and Pt. on medications + Peripheral Vascular Disease  Normal cardiovascular exam     Neuro/Psych Anxiety negative neurological ROS     GI/Hepatic Neg liver ROS, GERD  Medicated and Controlled,ACUTE CHOLECYSTITIS   Endo/Other  negative endocrine ROS  Renal/GU Renal InsufficiencyRenal disease (Cr 1.49)  negative genitourinary   Musculoskeletal negative musculoskeletal ROS (+)   Abdominal   Peds  Hematology  (+) Blood dyscrasia (Hgb 11.5, Plt 135k), anemia ,   Anesthesia Other Findings Day of surgery medications reviewed with patient.  Reproductive/Obstetrics negative OB ROS                             Anesthesia Physical Anesthesia Plan  ASA: 2 and emergent  Anesthesia Plan: General   Post-op Pain Management: Ofirmev IV (intra-op)*   Induction: Intravenous, Rapid sequence and Cricoid pressure planned  PONV Risk Score and Plan: Treatment may vary due to age or medical condition, Ondansetron and Dexamethasone  Airway Management Planned: Oral ETT  Additional Equipment: None  Intra-op Plan:   Post-operative Plan: Extubation in OR  Informed Consent: I have reviewed the patients History and Physical, chart, labs and discussed the procedure including the risks, benefits and alternatives for the proposed anesthesia with the patient or authorized representative who  has indicated his/her understanding and acceptance.     Dental advisory given  Plan Discussed with: CRNA  Anesthesia Plan Comments:         Anesthesia Quick Evaluation

## 2022-07-05 NOTE — Progress Notes (Signed)
A consult was received from an ED physician for cefepime per pharmacy dosing.  The patient's profile has been reviewed for ht/wt/allergies/indication/available labs.   A one time order has been placed for cefepime 2g IV x1.    Further antibiotics/pharmacy consults should be ordered by admitting physician if indicated.                       Thank you,  Dimple Nanas, PharmD, BCPS 07/05/2022 12:04 PM

## 2022-07-05 NOTE — ED Triage Notes (Signed)
EMS reports from home, woke up with URQ abdominal pain. Dx with Gallstones 3 weeks ago.  BP 98/49 HR 50 RR 16 Sp02 98 RA CBG 138  20ga LAC 532m NS enroute

## 2022-07-05 NOTE — Op Note (Signed)
Date: 07/05/22  Patient: Francisco [name] MRN: [id number]  Preoperative Diagnosis: Symptomatic cholelithiasis Postoperative Diagnosis: Acute on chronic cholecystitis  Procedure: Laparoscopic subtotal fenestrating cholecystectomy with drain placement  Surgeon: Michaelle Birks, MD  EBL: Minimal  Anesthesia: General endotracheal  Specimens: Gallbladder  Indications: Francisco [name] is an 81 yo male who presented to the ED with several hours of constant RUQ abdominal pain. He has previously had similar episodes and 2 months ago was admitted with Gram-negative bacteremia. RUQ [place] today showed multiple large stones in the gallbladder. After a discussion of the risks and benefits of surgery, the patient agreed to proceed with cholecystectomy.  Findings: Acute on chronic cholecystitis with pus within the gallbladder. Fibrotic inflammation of the cystic triangle prevented safe dissection of the cystic duct, so a subtotal cholecystectomy was performed.  Procedure details: Informed consent was obtained in the preoperative area prior to the procedure. The patient was brought to the operating room and placed on the table in the supine position. General anesthesia was induced and appropriate lines and drains were placed for intraoperative monitoring. Perioperative antibiotics were administered per SCIP guidelines. The abdomen was prepped and draped in the usual sterile fashion. A pre-procedure timeout was taken verifying patient identity, surgical site and procedure to be performed.  A small supraumbilical skin incision was made, the subcutaneous tissue was divided with cautery, and the fascia was grasped and elevated. The fascia was incised and the peritoneal cavity was directly visualized. A 45m Hassan trocar was placed and the abdomen was insufflated. The peritoneal cavity was inspected with no evidence of visceral or vascular injury. Three 570mports were placed in the right subcostal margin, all under direct  visualization. The gallbladder was distended and tense with wall-thickening, consistent with acute cholecystitis. Needle decompression was performed to aid in retraction. The fundus of the gallbladder was grasped and retracted cephalad. There were omental adhesions to the gallbladder, which were bluntly dissected off. The infundibulum was retracted laterally. Large stones were palpable within the lumen of the gallbladder. Calot's node was visible but the cystic triangle was covered with thick fibrotic tissue, which appeared to have fused with the porta hepatis. It was clear that the cystic duct could not be safely dissected out without significant risk of injury to the common bile duct, so the decision was made to proceed with a subtotal cholecystectomy. The wall of the gallbladder was opened with cautery and purulent fluid was evacuated. The anterior wall of the gallbladder was then excised with cautery, starting at the infundibulum and working up to the dome. Bleeding from the cystic artery was controlled with placement of two clips. Multiple large and small stones were extracted from the lumen. The small remnant at the infundibulum was irrigated and examined and no stones remained. There was no visible return of bile, consistent with cystic duct occlusion. The mucosa on the posterior wall of the gallbladder was fulgurated with cautery. All stones and the excised wall of the gallbadder were placed in an Ecosac. The surgical site was irrigated with saline until the effluent was clear.  A 19-Fr JP drain was placed adjacent to the remnant gallbladder and brought out through the right lateral port site, and secured to the skin with 2-0 Nylon suture. The ports were removed under direct visualization and the abdomen was desufflated. The specimen was extracted via the umbilical port site. The umbilical port site fascia was closed with 0 vicryl figure-of-eight sutures. The skin at all port sites was closed with 4-0  monocryl  subcuticular suture. Dermabond was applied.  The patient tolerated the procedure well with no apparent complications. All counts were correct x2 at the end of the procedure. The patient was extubated and taken to PACU in stable condition.  Michaelle Birks, MD 07/05/22 3:09 PM

## 2022-07-05 NOTE — Transfer of Care (Signed)
Immediate Anesthesia Transfer of Care Note  Patient: Francisco [name]  Procedure(s) Performed: LAPAROSCOPIC CHOLECYSTECTOMY (Abdomen)  Patient Location: PACU  Anesthesia Type:General  Level of Consciousness: sedated, patient cooperative and responds to stimulation  Airway & Oxygen Therapy: Patient Spontanous Breathing and Patient connected to face mask oxygen  Post-op Assessment: Report given to RN and Post -op Vital signs reviewed and stable  Post vital signs: Reviewed and stable  Last Vitals:  Vitals Value Taken Time  BP 110/51 07/05/22 1510  Temp    Pulse 78 07/05/22 1512  Resp 12 07/05/22 1512  SpO2 99 % 07/05/22 1512  Vitals shown include unvalidated device data.  Last Pain:  Vitals:   07/05/22 1023  PainSc: 6          Complications: No notable events documented.

## 2022-07-05 NOTE — Anesthesia Postprocedure Evaluation (Signed)
Anesthesia Post Note  Patient: Francisco [name]  Procedure(s) Performed: LAPAROSCOPIC CHOLECYSTECTOMY (Abdomen)     Patient location during evaluation: PACU Anesthesia Type: General Level of consciousness: awake and alert Pain management: pain level controlled Vital Signs Assessment: post-procedure vital signs reviewed and stable Respiratory status: spontaneous breathing, nonlabored ventilation and respiratory function stable Cardiovascular status: blood pressure returned to baseline Postop Assessment: no apparent nausea or vomiting Anesthetic complications: no   No notable events documented.  Last Vitals:  Vitals:   07/05/22 1615 07/05/22 1630  BP: (!) 84/51 (!) 88/51  Pulse: 67 69  Resp: 12 12  Temp:  37.1 C  SpO2: 96% 96%    Last Pain:  Vitals:   07/05/22 1615  PainSc: Asleep                 Marthenia Rolling

## 2022-07-05 NOTE — Progress Notes (Signed)
Pt stable on arrival to floor. Pt sleepy though able to wake up easily. Pt denies pain at this time. Pt incisions are clean, dry, and intact. Pt does appear to have some memory issues repeating stories with nursing staff. Rn will continue to monitor.

## 2022-07-05 NOTE — ED Notes (Signed)
CRITICAL VALUE STICKER  CRITICAL VALUE: lactic acid of 2.8  RECEIVER (on-site recipient of call): Cache Bills, [place] NOTIFIED: 07/05/22 @ 13:29  MESSENGER (representative from lab): Claiborne Billings, [facility]  MD NOTIFIED: Shirlee Limerick, [place] made aware  TIME OF NOTIFICATION: 13:31  RESPONSE:  Provider verbalized acknowledgement of lab value.

## 2022-07-05 NOTE — ED Provider Notes (Signed)
[facility] DEPT Provider Note   CSN: [id number] Arrival date & time: 07/05/22  1019     History PMH: HTN, GERD, cholelithiasis, HLD, CKD stage 3 Chief Complaint  Patient presents with  . Abdominal Pain    Francisco [name] is a 81 y.o. male. Patient presents the ED with right upper and epigastric abdominal pain.  Says this started suddenly around 6 AM and woke him up from sleep.  The pain is remained constant and has gotten worse.  It is worse with deep breaths and movement.  He said that he tried to drink some water but this did not help.  He says it feels similar to when he was diagnosed with pleurisy.  He has no other associated symptoms such as fever, chills, nausea, vomiting, diarrhea, constipation, urinary symptoms, flank pain, chest pain, or shortness of breath.  He says that he has been diagnosed with gallstones and has not had his gallbladder removed.  He has no history of intra-abdominal surgeries.  He was recently admitted last month for sepsis with E. coli bacteremia.   Abdominal Pain Associated symptoms: no chest pain, no chills, no constipation, no cough, no diarrhea, no dysuria, no fever, no hematuria, no nausea, no shortness of breath and no vomiting        Home Medications Prior to Admission medications   Medication Sig Start Date End Date Taking? Authorizing Provider  acetaminophen (TYLENOL) 325 MG tablet Take 650 mg by mouth every 6 (six) hours as needed for mild pain or fever.    [provider]  ALPRAZolam Duanne Moron) 1 MG tablet TAKE 1 TABLET BY MOUTH TWICE  DAILY AS NEEDED FOR ANXIETY OR  SLEEP 06/29/22   Hassell Done Mary-Margaret, FNP  aspirin 81 MG tablet Take 81 mg by mouth daily.    [provider]  escitalopram (LEXAPRO) 10 MG tablet TAKE 2 TABLETS BY MOUTH DAILY 06/05/22   Hassell Done, Mary-Margaret, FNP  fish oil-omega-3 fatty acids 1000 MG capsule Take 2 g by mouth daily.    [provider]  furosemide (LASIX)  20 MG tablet TAKE 1 TABLET BY MOUTH DAILY 06/15/22   Hassell Done, Mary-Margaret, FNP  meclizine (ANTIVERT) 25 MG tablet TAKE 1 TABLET BY MOUTH 3  TIMES DAILY AS NEEDED FOR  DIZZINESS Patient taking differently: Take 25 mg by mouth 3 (three) times daily as needed for dizziness. 03/02/22   Hassell Done Mary-Margaret, FNP  meloxicam (MOBIC) 7.5 MG tablet TAKE 1 TABLET BY MOUTH DAILY 06/25/22   Hassell Done, Mary-Margaret, FNP  metoprolol succinate (TOPROL-XL) 25 MG 24 hr tablet Take 1 tablet (25 mg total) by mouth 2 (two) times daily. 05/04/22   Park Liter, MD  pantoprazole (PROTONIX) 20 MG tablet TAKE 1 TABLET BY MOUTH DAILY Patient taking differently: Take 20 mg by mouth daily. 05/01/22   Hassell Done, Mary-Margaret, FNP  simvastatin (ZOCOR) 80 MG tablet Take 1 tablet (80 mg total) by mouth daily. 12/16/21   Park Liter, MD  tamsulosin (FLOMAX) 0.4 MG CAPS capsule TAKE 1 CAPSULE BY MOUTH DAILY Patient taking differently: Take 0.4 mg by mouth daily. 03/20/22   [name], FNP      Allergies    Crestor [rosuvastatin], Lipitor [atorvastatin], Penicillins, and Symbicort [budesonide-formoterol fumarate]    Review of Systems   Review of Systems  Constitutional:  Negative for chills and fever.  Respiratory:  Negative for cough and shortness of breath.   Cardiovascular:  Negative for chest pain.  Gastrointestinal:  Positive for abdominal pain. Negative  for abdominal distention, constipation, diarrhea, nausea and vomiting.  Genitourinary:  Negative for dysuria, flank pain and hematuria.  Neurological:  Negative for dizziness and weakness.  Psychiatric/Behavioral:  Negative for confusion.   All other systems reviewed and are negative.   Physical Exam Updated Vital Signs BP (!) 107/52   Pulse 71   Temp 99 F (37.2 C)   Resp 15   SpO2 95%  Physical Exam Vitals and nursing note reviewed.  Constitutional:      General: He is not in acute distress.    Appearance: Normal appearance. He is not  ill-appearing, toxic-appearing or diaphoretic.  HENT:     Head: Normocephalic and atraumatic.     Nose: No nasal deformity.     Mouth/Throat:     Lips: Pink. No lesions.     Mouth: Mucous membranes are moist. No injury, lacerations, oral lesions or angioedema.     Pharynx: Oropharynx is clear. Uvula midline. No pharyngeal swelling, oropharyngeal exudate, posterior oropharyngeal erythema or uvula swelling.  Eyes:     General: Gaze aligned appropriately. No scleral icterus.       Right eye: No discharge.        Left eye: No discharge.     Conjunctiva/sclera: Conjunctivae normal.     Right eye: Right conjunctiva is not injected. No exudate or hemorrhage.    Left eye: Left conjunctiva is not injected. No exudate or hemorrhage.    Pupils: Pupils are equal, round, and reactive to light.  Cardiovascular:     Rate and Rhythm: Normal rate and regular rhythm.     Pulses: Normal pulses.          Radial pulses are 2+ on the right side and 2+ on the left side.       Dorsalis pedis pulses are 2+ on the right side and 2+ on the left side.     Heart sounds: Normal heart sounds, S1 normal and S2 normal. Heart sounds not distant. No murmur heard.    No friction rub. No gallop. No S3 or S4 sounds.  Pulmonary:     Effort: Pulmonary effort is normal. No accessory muscle usage or respiratory distress.     Breath sounds: Normal breath sounds. No stridor. No wheezing, rhonchi or rales.  Chest:     Chest wall: No tenderness.  Abdominal:     General: Abdomen is flat. There is no distension.     Palpations: Abdomen is soft. There is no mass or pulsatile mass.     Tenderness: There is abdominal tenderness in the right upper quadrant and epigastric area. There is no right CVA tenderness, left CVA tenderness, guarding or rebound. Positive signs include Murphy's sign. Negative signs include McBurney's sign.     Hernia: No hernia is present.  Musculoskeletal:     Right lower leg: No edema.     Left lower leg: No  edema.  Skin:    General: Skin is warm and dry.     Coloration: Skin is not jaundiced or pale.     Findings: No bruising, erythema, lesion or rash.  Neurological:     General: No focal deficit present.     Mental Status: He is alert and oriented to person, place, and time.     GCS: GCS eye subscore is 4. GCS verbal subscore is 5. GCS motor subscore is 6.  Psychiatric:        Mood and Affect: Mood normal.        Behavior: Behavior  normal. Behavior is cooperative.     ED Results / Procedures / Treatments   Labs (all labs ordered are listed, but only abnormal results are displayed) Labs Reviewed  CBC WITH DIFFERENTIAL/PLATELET - Abnormal; Notable for the following components:      Result Value   RBC 3.57 (*)    Hemoglobin 11.5 (*)    HCT 34.2 (*)    Platelets 135 (*)    Neutro Abs 8.0 (*)    All other components within normal limits  COMPREHENSIVE METABOLIC PANEL - Abnormal; Notable for the following components:   Glucose, Bld 117 (*)    BUN 26 (*)    Creatinine, Ser 1.49 (*)    Calcium 8.1 (*)    Total Protein 5.1 (*)    Albumin 3.2 (*)    Alkaline Phosphatase 30 (*)    GFR, Estimated 47 (*)    All other components within normal limits  LACTIC ACID, PLASMA - Abnormal; Notable for the following components:   Lactic Acid, Venous 2.8 (*)    All other components within normal limits  APTT - Abnormal; Notable for the following components:   aPTT <20 (*)    All other components within normal limits  RESP PANEL BY RT-PCR (FLU A&B, COVID) ARPGX2  CULTURE, BLOOD (ROUTINE X 2)  CULTURE, BLOOD (ROUTINE X 2)  URINE CULTURE  LIPASE, BLOOD  PROTIME-INR  URINALYSIS, ROUTINE W REFLEX MICROSCOPIC  LACTIC ACID, PLASMA  BASIC METABOLIC PANEL  CBC  SURGICAL PATHOLOGY  TROPONIN I (HIGH SENSITIVITY)  TROPONIN I (HIGH SENSITIVITY)    EKG None  Radiology US Abdomen Limited RUQ (LIVER/GB)  Result Date: 07/05/2022 CLINICAL DATA:  Right upper quadrant pain EXAM: ULTRASOUND ABDOMEN  LIMITED RIGHT UPPER QUADRANT COMPARISON:  None Available. FINDINGS: Gallbladder: Cholelithiasis with the largest gallstone measuring 2.4 cm. No pericholecystic fluid. Top-normal gallbladder wall thickness. Negative sonographic Murphy sign. Common bile duct: Diameter: 5 mm Liver: No focal lesion identified. Within normal limits in parenchymal echogenicity. Portal vein is patent on color Doppler imaging with normal direction of blood flow towards the liver. Other: None. IMPRESSION: 1. Cholelithiasis without sonographic evidence of acute cholecystitis. Electronically Signed   By: Kathreen Devoid M.D.   On: 07/05/2022 11:24   DG Chest 2 View  Result Date: 07/05/2022 CLINICAL DATA:  Pleuritic chest pain. EXAM: CHEST - 2 VIEW COMPARISON:  05/05/2022 FINDINGS: The heart size and mediastinal contours are unremarkable. There is no pleural effusion or edema. No airspace opacities identified. Signs of previous ACDF within the cervical spine. IMPRESSION: No active cardiopulmonary abnormalities. Electronically Signed   By: Kerby Moors M.D.   On: 07/05/2022 11:12    Procedures Procedures  This patient was on telemetry or cardiac monitoring during their time in the ED.    Medications Ordered in ED Medications  lactated ringers infusion (has no administration in time range)  metroNIDAZOLE (FLAGYL) IVPB 500 mg ( Intravenous MAR Unhold 07/05/22 1734)  enoxaparin (LOVENOX) injection 40 mg (has no administration in time range)  0.9 %  sodium chloride infusion (has no administration in time range)  acetaminophen (TYLENOL) tablet 1,000 mg (has no administration in time range)  docusate sodium (COLACE) capsule 100 mg (has no administration in time range)  ondansetron (ZOFRAN-ODT) disintegrating tablet 4 mg (has no administration in time range)    Or  ondansetron (ZOFRAN) injection 4 mg (has no administration in time range)  polyethylene glycol (MIRALAX / GLYCOLAX) packet 17 g (has no administration in time range)   aspirin  EC tablet 81 mg (has no administration in time range)  escitalopram (LEXAPRO) tablet 20 mg (has no administration in time range)  meclizine (ANTIVERT) tablet 25 mg (has no administration in time range)  pantoprazole (PROTONIX) EC tablet 20 mg (has no administration in time range)  metoprolol succinate (TOPROL-XL) 24 hr tablet 25 mg (has no administration in time range)  atorvastatin (LIPITOR) tablet 40 mg (has no administration in time range)  tamsulosin (FLOMAX) capsule 0.4 mg (has no administration in time range)  acetaminophen (OFIRMEV) 10 MG/ML IV (has no administration in time range)  traMADol (ULTRAM) tablet 50 mg (has no administration in time range)  albumin human 5 % solution (has no administration in time range)  ondansetron (ZOFRAN) injection 4 mg (4 mg Intravenous Given 07/05/22 1049)  morphine (PF) 4 MG/ML injection 4 mg (4 mg Intravenous Given 07/05/22 1049)  sodium chloride 0.9 % bolus 1,000 mL (0 mLs Intravenous Stopped 07/05/22 1313)  ceFEPIme (MAXIPIME) 2 g in sodium chloride 0.9 % 100 mL IVPB (0 g Intravenous Stopped 07/05/22 1313)  HYDROmorphone (DILAUDID) injection 1 mg (1 mg Intravenous Given 07/05/22 1228)  lactated ringers infusion (1,000 mLs  New Bag/Given 07/05/22 1417)  acetaminophen (OFIRMEV) IV 1,000 mg (1,000 mg Intravenous New Bag/Given 07/05/22 1526)  albumin human 5 % solution 12.5 g (12.5 g Intravenous New Bag/Given 07/05/22 1619)    ED Course/ Medical Decision Making/ A&P                            Medical Decision Making Amount and/or Complexity of Data Reviewed Labs: ordered. Radiology: ordered.  Risk Prescription drug management.    MDM  This is a 81 y.o. male who presents to the ED with RUQ/epigastric abdominal pain gradually worsening over the past 4.5 hours The differential of this patient includes but is not limited to Aortic Dissection, ACS/MI, Gallbladder etiology (cholecystitis, cholangitis, biliary colic), GERD, Pancreatitis, PNA,  PE, PUD, and perforation.   Initial Impression  Well appearing and in no acute distress Stable vitals at this time. He has isolated RUQ/epigastric pain for about 4.5 hours now. No other associated symptoms.  I personally ordered, reviewed, and interpreted all laboratory work and imaging and agree with radiologist interpretation. Results interpreted below:  No leukocytosis, LFTs are normal, kidney function stable, lipase 32, troponins negative, initial lactic acid 2.8, blood cultures in process. Right upper quadrant ultrasound notable for cholelithiasis without signs of cholecystitis Chest x-ray without acute abnormality EKG nonischemic  Assessment/Plan:  On reassessment, patient has not had an improvement in his pain is now developed rigors.  Vitals remain stable. On chart review, he does have a recent history of sepsis so we went ahead and started him on broad-spectrum antibiotics, blood cultures, and lactate.  I suspect he could have early cholecystitis versus symptomatic cholelithiasis causing his symptoms I discussed this case with Michaelle Birks with general surgery who has evaluated this patient.  Plan is for him to go to the operating room for cholecystectomy.     Charting Requirements Additional history is obtained from:  Independent historian External Records from outside source obtained and reviewed including: Prior CT imaging, [place] from June 2023, recent admission note Social Determinants of Health:  none Pertinant PMH that complicates patient's illness: Cholelithiasis  Patient Care Problems that were addressed during this visit: - cholelithiasis: Acute illness with systemic symptoms This patient was maintained on a cardiac monitor/telemetry. I personally viewed and interpreted the cardiac monitor which  reveals an underlying rhythm of NSR Medications given in ED: Morphine, Zofran, dilaudid, Cefepime, flagyl, IVF Reevaluation of the patient after these medicines showed that the  patient stayed the same I have reviewed home medications and made changes accordingly.  Critical Care Interventions: n/a Consultations: General Surgery Disposition: Admit  This is a shared visit with my attending physician, Dr. Kathrynn Humble.  We have discussed this patient and they have independently evaluated this patient. The plan was altered or changed as needed.  Portions of this note were generated with [employer]. Dictation errors may occur despite best attempts at proofreading.    Final Clinical Impression(s) / ED Diagnoses Final diagnoses:  Calculus of gallbladder and bile duct without cholecystitis or obstruction    Rx / DC Orders ED Discharge Orders     None         Adolphus Birchwood, PA-C 07/05/22 1740    Varney Biles, MD 07/10/22 1501

## 2022-07-06 ENCOUNTER — Encounter (HOSPITAL_COMMUNITY): Payer: Self-pay | Admitting: Surgery

## 2022-07-06 DIAGNOSIS — K812 Acute cholecystitis with chronic cholecystitis: Secondary | ICD-10-CM | POA: Diagnosis not present

## 2022-07-06 DIAGNOSIS — R7989 Other specified abnormal findings of blood chemistry: Secondary | ICD-10-CM | POA: Diagnosis present

## 2022-07-06 DIAGNOSIS — D649 Anemia, unspecified: Secondary | ICD-10-CM | POA: Diagnosis present

## 2022-07-06 LAB — CBC
HCT: 31.8 % — ABNORMAL LOW (ref 39.0–52.0)
Hemoglobin: 10.9 g/dL — ABNORMAL LOW (ref 13.0–17.0)
MCH: 33.1 pg (ref 26.0–34.0)
MCHC: 34.3 g/dL (ref 30.0–36.0)
MCV: 96.7 fL (ref 80.0–100.0)
Platelets: 116 10*3/uL — ABNORMAL LOW (ref 150–400)
RBC: 3.29 MIL/uL — ABNORMAL LOW (ref 4.22–5.81)
RDW: 13.6 % (ref 11.5–15.5)
WBC: 11.7 10*3/uL — ABNORMAL HIGH (ref 4.0–10.5)
nRBC: 0 % (ref 0.0–0.2)

## 2022-07-06 LAB — BASIC METABOLIC PANEL
Anion gap: 8 (ref 5–15)
BUN: 30 mg/dL — ABNORMAL HIGH (ref 8–23)
CO2: 25 mmol/L (ref 22–32)
Calcium: 7.9 mg/dL — ABNORMAL LOW (ref 8.9–10.3)
Chloride: 108 mmol/L (ref 98–111)
Creatinine, Ser: 1.72 mg/dL — ABNORMAL HIGH (ref 0.61–1.24)
GFR, Estimated: 39 mL/min — ABNORMAL LOW (ref >60)
Glucose, Bld: 198 mg/dL — ABNORMAL HIGH (ref 70–99)
Potassium: 4.4 mmol/L (ref 3.5–5.1)
Sodium: 141 mmol/L (ref 135–145)

## 2022-07-06 LAB — URINALYSIS, ROUTINE W REFLEX MICROSCOPIC
Bilirubin Urine: NEGATIVE
Glucose, UA: NEGATIVE mg/dL
Hgb urine dipstick: NEGATIVE
Ketones, ur: NEGATIVE mg/dL
Leukocytes,Ua: NEGATIVE
Nitrite: NEGATIVE
Protein, ur: NEGATIVE mg/dL
Specific Gravity, Urine: 1.026 (ref 1.005–1.030)
pH: 5 (ref 5.0–8.0)

## 2022-07-06 LAB — GLUCOSE, CAPILLARY
Glucose-Capillary: 160 mg/dL — ABNORMAL HIGH (ref 70–99)
Glucose-Capillary: 132 mg/dL — ABNORMAL HIGH (ref 70–99)
Glucose-Capillary: 180 mg/dL — ABNORMAL HIGH (ref 70–99)

## 2022-07-06 LAB — HEMOGLOBIN A1C
Hgb A1c MFr Bld: 5.5 % (ref 4.8–5.6)
Mean Plasma Glucose: 111.15 mg/dL

## 2022-07-06 MED ORDER — INSULIN ASPART 100 UNIT/ML IJ SOLN
0.0000 [IU] | Freq: Three times a day (TID) | INTRAMUSCULAR | Status: DC
  Administered 2022-07-06: 2 [IU] via SUBCUTANEOUS
  Administered 2022-07-06 – 2022-07-07 (×2): 3 [IU] via SUBCUTANEOUS
  Administered 2022-07-07: 2 [IU] via SUBCUTANEOUS
  Administered 2022-07-07: 3 [IU] via SUBCUTANEOUS
  Administered 2022-07-08 (×2): 2 [IU] via SUBCUTANEOUS
  Administered 2022-07-08: 3 [IU] via SUBCUTANEOUS
  Administered 2022-07-09 – 2022-07-12 (×9): 2 [IU] via SUBCUTANEOUS
  Administered 2022-07-12: 3 [IU] via SUBCUTANEOUS
  Administered 2022-07-12 – 2022-07-13 (×2): 2 [IU] via SUBCUTANEOUS
  Administered 2022-07-13: 3 [IU] via SUBCUTANEOUS
  Administered 2022-07-14: 2 [IU] via SUBCUTANEOUS

## 2022-07-06 MED ORDER — PHENOL 1.4 % MT LIQD: 2.0000 | OROMUCOSAL | Status: DC | PRN

## 2022-07-06 MED ORDER — HYDROMORPHONE HCL 1 MG/ML IJ SOLN: 0.5000 mg | INTRAMUSCULAR | Status: DC | PRN

## 2022-07-06 MED ORDER — OXYCODONE HCL 5 MG PO TABS
2.5000 mg | ORAL_TABLET | ORAL | Status: DC | PRN
  Administered 2022-07-06: 2.5 mg via ORAL
  Filled 2022-07-06: qty 1

## 2022-07-06 MED ORDER — MAGIC MOUTHWASH: 15.0000 mL | Freq: Four times a day (QID) | ORAL | Status: DC | PRN

## 2022-07-06 MED ORDER — LACTATED RINGERS IV SOLN: INTRAVENOUS | Status: DC

## 2022-07-06 MED ORDER — METOPROLOL TARTRATE 5 MG/5ML IV SOLN
5.0000 mg | Freq: Four times a day (QID) | INTRAVENOUS | Status: DC | PRN
  Administered 2022-07-10 – 2022-07-11 (×2): 5 mg via INTRAVENOUS
  Filled 2022-07-06 (×2): qty 5

## 2022-07-06 MED ORDER — ENALAPRILAT 1.25 MG/ML IV SOLN: 0.6250 mg | Freq: Four times a day (QID) | INTRAVENOUS | Status: DC | PRN

## 2022-07-06 MED ORDER — MENTHOL 3 MG MT LOZG
1.0000 | LOZENGE | OROMUCOSAL | Status: DC | PRN
  Administered 2022-07-06 – 2022-07-08 (×2): 3 mg via ORAL
  Filled 2022-07-06 (×2): qty 9

## 2022-07-06 MED ORDER — ALUM & MAG HYDROXIDE-SIMETH 200-200-20 MG/5ML PO SUSP: 30.0000 mL | Freq: Four times a day (QID) | ORAL | Status: DC | PRN

## 2022-07-06 MED ORDER — DIPHENHYDRAMINE HCL 50 MG/ML IJ SOLN: 12.5000 mg | Freq: Four times a day (QID) | INTRAMUSCULAR | Status: DC | PRN

## 2022-07-06 MED ORDER — LACTATED RINGERS IV BOLUS: 1000.0000 mL | Freq: Three times a day (TID) | INTRAVENOUS | Status: AC | PRN

## 2022-07-06 MED ORDER — OXYCODONE HCL 5 MG PO TABS
5.0000 mg | ORAL_TABLET | ORAL | Status: DC | PRN
  Administered 2022-07-06: 5 mg via ORAL
  Administered 2022-07-06: 10 mg via ORAL
  Administered 2022-07-07: 5 mg via ORAL
  Administered 2022-07-07: 10 mg via ORAL
  Filled 2022-07-06: qty 1
  Filled 2022-07-06: qty 2
  Filled 2022-07-06: qty 1
  Filled 2022-07-06: qty 2

## 2022-07-06 MED ORDER — SODIUM CHLORIDE 0.9 % IV SOLN
2.0000 g | Freq: Two times a day (BID) | INTRAVENOUS | Status: DC
Start: 2022-07-06 — End: 2022-07-10
  Administered 2022-07-06 – 2022-07-10 (×9): 2 g via INTRAVENOUS
  Filled 2022-07-06 (×9): qty 12.5

## 2022-07-06 MED ORDER — BISACODYL 10 MG RE SUPP: 10.0000 mg | Freq: Two times a day (BID) | RECTAL | Status: DC | PRN

## 2022-07-06 MED ORDER — METHOCARBAMOL 500 MG PO TABS
1000.0000 mg | ORAL_TABLET | Freq: Four times a day (QID) | ORAL | Status: DC | PRN
  Administered 2022-07-06 – 2022-07-11 (×2): 1000 mg via ORAL
  Filled 2022-07-06 (×3): qty 2

## 2022-07-06 MED ORDER — PROCHLORPERAZINE EDISYLATE 10 MG/2ML IJ SOLN
5.0000 mg | INTRAMUSCULAR | Status: DC | PRN
  Administered 2022-07-08: 10 mg via INTRAVENOUS
  Filled 2022-07-06: qty 2

## 2022-07-06 MED ORDER — INSULIN ASPART 100 UNIT/ML IJ SOLN: 0.0000 [IU] | Freq: Every day | INTRAMUSCULAR | Status: DC

## 2022-07-06 MED ORDER — METHOCARBAMOL 1000 MG/10ML IJ SOLN: 1000.0000 mg | Freq: Four times a day (QID) | INTRAVENOUS | Status: DC | PRN

## 2022-07-06 MED ORDER — CALCIUM POLYCARBOPHIL 625 MG PO TABS
625.0000 mg | ORAL_TABLET | Freq: Two times a day (BID) | ORAL | Status: DC
  Administered 2022-07-06 – 2022-07-14 (×16): 625 mg via ORAL
  Filled 2022-07-06 (×16): qty 1

## 2022-07-06 MED ORDER — LIP MEDEX EX OINT
TOPICAL_OINTMENT | Freq: Two times a day (BID) | CUTANEOUS | Status: DC
  Administered 2022-07-06: 1 via TOPICAL
  Administered 2022-07-06: 75 via TOPICAL
  Administered 2022-07-07 (×2): 1 via TOPICAL
  Administered 2022-07-08 – 2022-07-13 (×4): 75 via TOPICAL
  Administered 2022-07-14: 1 via TOPICAL
  Filled 2022-07-06 (×2): qty 7

## 2022-07-06 MED ORDER — SIMETHICONE 40 MG/0.6ML PO SUSP: 80.0000 mg | Freq: Four times a day (QID) | ORAL | Status: DC | PRN

## 2022-07-06 NOTE — Progress Notes (Signed)
Transition of Care (TOC) Screening Note  Patient Details  Name: Francisco [name] Date of Birth: June 09, 1941  Transition of Care [facility]) CM/SW Contact:    Sherie Don, LCSW Phone Number: 07/06/2022, 10:03 AM  Transition of Care Department [facility]) has reviewed patient and no TOC needs have been identified at this time. We will continue to monitor patient advancement through interdisciplinary progression rounds. If new patient transition needs arise, please place a TOC consult.

## 2022-07-06 NOTE — Plan of Care (Signed)

## 2022-07-06 NOTE — Progress Notes (Signed)
Francisco [name] [id number] [birth year]  CARE TEAM:  PCP: [name], FNP  Outpatient Care Team: Patient Care Team: [name], FNP as PCP - General (Nurse Practitioner)  Inpatient Treatment Team: Treatment Team: Attending Provider: Nolon Nations, MD; Consulting Physician: Edison Pace, Md, MD; Technician: Wright, [place] E, NT; Pharmacist: Karren Cobble, [facility]; Registered Nurse: Sharmon Leyden, RN; Utilization Review: Micah Noel, RN; Social Worker: Sherie Don, [place]   Problem List:   Principal Problem:   RUQ abdominal pain Active Problems:   Calculus of gallbladder without cholecystitis without obstruction   Acute calculous cholecystitis   1 Day Post-Op  07/05/2022  Preoperative Diagnosis: Symptomatic cholelithiasis Postoperative Diagnosis: Acute on chronic cholecystitis   Procedure: Laparoscopic subtotal fenestrating cholecystectomy with drain placement   Surgeon: Michaelle Birks, MD   Findings:  Acute on chronic cholecystitis with pus within the gallbladder. Fibrotic inflammation of the cystic triangle prevented safe dissection of the cystic duct, so a subtotal cholecystectomy was performed.   Assessment  Wiped out but stable  [facility] Stay = 0 days)  Plan:  -Continue drain for now.  High risk for leak given the need of subtotal cholecystectomy given poor anatomy.  Will defer to Dr. Zenia Resides need for ERCP/stent/HIDA scan in the future.  We will see.  -Continue antibiotics.  -Solid diet as tolerated.  Improved pain control.  Tramadol did not seem to be adequate.  We will try oxycodone.  Scheduled Tylenol but follow LFTs.  Follow pathology.  He is rather hyperglycemic but denies diabetes.  Might do sliding scale insulin for now.  He has chronic kidney disease and creatinine is increased.  Do not want to fluid overload him nor do I want him dehydrated.  I think it be helpful to have the internal medicine TRH service help follow the patient with this  since he is elderly and also more complicated.  -VTE prophylaxis- SCDs, etc  -mobilize as tolerated to help recovery.  See what physical Occupational Therapy think.  Disposition:  Disposition:  The patient is from: Home  Anticipate discharge to:  Home with Home Health  Anticipated Date of Discharge is:  August 23,2023    Barriers to discharge:  Pending Clinical improvement (more likely than not)  Patient currently is NOT MEDICALLY STABLE for discharge from the hospital from a surgery standpoint.      I reviewed nursing notes, last 24 h vitals and pain scores, last 48 h intake and output, last 24 h labs and trends, and last 24 h imaging results. I have reviewed this patient's available data, including medical history, events of note, test results, etc as part of my evaluation.  A significant portion of that time was spent in counseling.  Care during the described time interval was provided by me.  This care required high  level of medical decision making.  07/06/2022    Subjective: (Chief complaint)  Feeling rather sore.  Sitting up in chair.  Tolerated some liquids.  No severe nausea.  Objective:  Vital signs:  Vitals:   07/05/22 2134 07/06/22 0225 07/06/22 0615 07/06/22 0849  BP: (!) 150/95 (!) 102/51 114/66 111/62  Pulse: 79 61 68 66  Resp: '18 18 18 17  '$ Temp: 97.8 F (36.6 C) 98.3 F (36.8 C) 98.9 F (37.2 C) 99 F (37.2 C)  TempSrc: Oral Oral Oral Oral  SpO2: 99% 92% 98% 95%  Weight:      Height:        Last BM Date : 07/04/22  Intake/Output   Yesterday:  08/20 0701 - 08/21 0700 In: 3135.4 [P.O.:600; I.V.:2051; IV Piggyback:484.3] Out: 920 [Urine:700; Drains:210; Blood:10] This shift:  Total I/O In: 415.1 [P.O.:240; I.V.:175.1] Out: 200 [Urine:200]  Bowel function:  Flatus: No  BM:  No  Drain:  Thinly bilious.  Mostly serous.   Physical Exam:  General: Pt awake/alert in mild acute distress Eyes: PERRL, normal EOM.  Sclera clear.  No  icterus Neuro: CN II-XII intact w/o focal sensory/motor deficits. Lymph: No head/neck/groin lymphadenopathy Psych:  No delerium/psychosis/paranoia.  Oriented x 4 HENT: Normocephalic, Mucus membranes moist.  No thrush Neck: Supple, No tracheal deviation.  No obvious thyromegaly Chest: No pain to chest wall compression.  Good respiratory excursion.  No audible wheezing CV:  Pulses intact.  Regular rhythm.  No major extremity edema MS: Normal AROM mjr joints.  No obvious deformity  Abdomen: Soft.  Obese.  Mildy distended.  Tenderness at right upper quadrant.  Specially at drain site. .  No evidence of peritonitis.  No incarcerated hernias.  Ext:   No deformity.  No mjr edema.  No cyanosis Skin: No petechiae / purpurea.  No major sores.  Warm and dry    Results:   Cultures: Recent Results (from the past 720 hour(s))  Resp Panel by RT-PCR (Flu A&B, Covid)     Status: None   Collection Time: 07/05/22 12:32 PM   Specimen: Nasal Swab  Result Value Ref Range Status   SARS Coronavirus 2 by RT PCR NEGATIVE NEGATIVE Final    Comment: (NOTE) SARS-CoV-2 target nucleic acids are NOT DETECTED.  The SARS-CoV-2 RNA is generally detectable in upper respiratory specimens during the acute phase of infection. The lowest concentration of SARS-CoV-2 viral copies this assay can detect is 138 copies/mL. A negative result does not preclude SARS-Cov-2 infection and should not be used as the sole basis for treatment or other patient management decisions. A negative result may occur with  improper specimen collection/handling, submission of specimen other than nasopharyngeal swab, presence of viral mutation(s) within the areas targeted by this assay, and inadequate number of viral copies(<138 copies/mL). A negative result must be combined with clinical observations, patient history, and epidemiological information. The expected result is Negative.  Fact Sheet for Patients:   EntrepreneurPulse.com.au  Fact Sheet for Healthcare Providers:  IncredibleEmployment.be  This test is no t yet approved or cleared by the [place] FDA and  has been authorized for detection and/or diagnosis of SARS-CoV-2 by FDA under an Emergency Use Authorization (EUA). This EUA will remain  in effect (meaning this test can be used) for the duration of the COVID-19 declaration under Section 564(b)(1) of the Act, 21 U.S.C.section 360bbb-3(b)(1), unless the authorization is terminated  or revoked sooner.       Influenza A by PCR NEGATIVE NEGATIVE Final   Influenza B by PCR NEGATIVE NEGATIVE Final    Comment: (NOTE) The Xpert Xpress SARS-CoV-2/FLU/RSV plus assay is intended as an aid in the diagnosis of influenza from Nasopharyngeal swab specimens and should not be used as a sole basis for treatment. Nasal washings and aspirates are unacceptable for Xpert Xpress SARS-CoV-2/FLU/RSV testing.  Fact Sheet for Patients: EntrepreneurPulse.com.au  Fact Sheet for Healthcare Providers: IncredibleEmployment.be  This test is not yet approved or cleared by the [place] FDA and has been authorized for detection and/or diagnosis of SARS-CoV-2 by FDA under an Emergency Use Authorization (EUA). This EUA will remain in effect (meaning this test can be used) for the duration of the COVID-19  declaration under Section 564(b)(1) of the Act, 21 U.S.C. section 360bbb-3(b)(1), unless the authorization is terminated or revoked.  Performed at [facility], [place] [address], [place], [place] [address]   Blood Culture (routine x 2)     Status: None (Preliminary result)   Collection Time: 07/05/22 12:32 PM   Specimen: BLOOD  Result Value Ref Range Status   Specimen Description   Final    BLOOD LEFT ANTECUBITAL Performed at [place] [address] [place], [place] [address]     Special Requests   Final    BOTTLES DRAWN AEROBIC AND ANAEROBIC Blood Culture adequate volume Performed at [place] [address] [place], [place] [address]    Culture   Final    NO GROWTH < 24 HOURS Performed at [place] [address], [place], [place] [address]    Report Status PENDING  Incomplete    Labs: Results for orders placed or performed during the hospital encounter of 07/05/22 (from the past 48 hour(s))  CBC with Differential     Status: Abnormal   Collection Time: 07/05/22 10:49 AM  Result Value Ref Range   WBC 9.3 4.0 - 10.5 K/uL   RBC 3.57 (L) 4.22 - 5.81 MIL/uL   Hemoglobin 11.5 (L) 13.0 - 17.0 g/dL   HCT 34.2 (L) 39.0 - 52.0 %   MCV 95.8 80.0 - 100.0 fL   MCH 32.2 26.0 - 34.0 pg   MCHC 33.6 30.0 - 36.0 g/dL   RDW 13.2 11.5 - 15.5 %   Platelets 135 (L) 150 - 400 K/uL   nRBC 0.0 0.0 - 0.2 %   Neutrophils Relative % 87 %   Neutro Abs 8.0 (H) 1.7 - 7.7 K/uL   Lymphocytes Relative 9 %   Lymphs Abs 0.9 0.7 - 4.0 K/uL   Monocytes Relative 3 %   Monocytes Absolute 0.3 0.1 - 1.0 K/uL   Eosinophils Relative 1 %   Eosinophils Absolute 0.1 0.0 - 0.5 K/uL   Basophils Relative 0 %   Basophils Absolute 0.0 0.0 - 0.1 K/uL   Immature Granulocytes 0 %   Abs Immature Granulocytes 0.02 0.00 - 0.07 K/uL    Comment: Performed at [facility], [place] [address], [place], [place] [address]  Comprehensive metabolic panel     Status: Abnormal   Collection Time: 07/05/22 10:49 AM  Result Value Ref Range   Sodium 141 135 - 145 mmol/L   Potassium 3.7 3.5 - 5.1 mmol/L   Chloride 109 98 - 111 mmol/L   CO2 26 22 - 32 mmol/L   Glucose, Bld 117 (H) 70 - 99 mg/dL    Comment: Glucose reference range applies only to samples taken after fasting for at least 8 hours.   BUN 26 (H) 8 - 23 mg/dL   Creatinine, Ser 1.49 (H) 0.61 - 1.24 mg/dL   Calcium 8.1 (L) 8.9 - 10.3 mg/dL   Total Protein 5.1 (L) 6.5 - 8.1 g/dL   Albumin 3.2 (L) 3.5 -  5.0 g/dL   AST 15 15 - 41 U/L   ALT 12 0 - 44 U/L   Alkaline Phosphatase 30 (L) 38 - 126 U/L   Total Bilirubin 0.6 0.3 - 1.2 mg/dL   GFR, Estimated 47 (L) >60 mL/min    Comment: (NOTE) Calculated using the CKD-EPI Creatinine Equation (2021)    Anion gap 6 5 - 15    Comment: Performed at [facility], [place] [address], [place], [place] [address]  Lipase, blood     Status: None   Collection Time: 07/05/22 10:49 AM  Result Value Ref Range   Lipase 32 11 - 51 U/L    Comment: Performed at [facility], [place] [address] [place], [place] [address]  Troponin I (High Sensitivity)     Status: None   Collection Time: 07/05/22 10:49 AM  Result Value Ref Range   Troponin I (High Sensitivity) 3 <18 ng/L    Comment: (NOTE) Elevated high sensitivity troponin I (hsTnI) values and significant  changes across serial measurements may suggest ACS but many other  chronic and acute conditions are known to elevate hsTnI results.  Refer to the "Links" section for chest pain algorithms and additional  guidance. Performed at [facility], [place] [address] [place], [place] [address]   Resp Panel by RT-PCR (Flu A&B, Covid)     Status: None   Collection Time: 07/05/22 12:32 PM   Specimen: Nasal Swab  Result Value Ref Range   SARS Coronavirus 2 by RT PCR NEGATIVE NEGATIVE    Comment: (NOTE) SARS-CoV-2 target nucleic acids are NOT DETECTED.  The SARS-CoV-2 RNA is generally detectable in upper respiratory specimens during the acute phase of infection. The lowest concentration of SARS-CoV-2 viral copies this assay can detect is 138 copies/mL. A negative result does not preclude SARS-Cov-2 infection and should not be used as the sole basis for treatment or other patient management decisions. A negative result may occur with  improper specimen collection/handling, submission of specimen other than nasopharyngeal swab, presence of viral mutation(s) within  the areas targeted by this assay, and inadequate number of viral copies(<138 copies/mL). A negative result must be combined with clinical observations, patient history, and epidemiological information. The expected result is Negative.  Fact Sheet for Patients:  EntrepreneurPulse.com.au  Fact Sheet for Healthcare Providers:  IncredibleEmployment.be  This test is no t yet approved or cleared by the [place] FDA and  has been authorized for detection and/or diagnosis of SARS-CoV-2 by FDA under an Emergency Use Authorization (EUA). This EUA will remain  in effect (meaning this test can be used) for the duration of the COVID-19 declaration under Section 564(b)(1) of the Act, 21 U.S.C.section 360bbb-3(b)(1), unless the authorization is terminated  or revoked sooner.       Influenza A by PCR NEGATIVE NEGATIVE   Influenza B by PCR NEGATIVE NEGATIVE    Comment: (NOTE) The Xpert Xpress SARS-CoV-2/FLU/RSV plus assay is intended as an aid in the diagnosis of influenza from Nasopharyngeal swab specimens and should not be used as a sole basis for treatment. Nasal washings and aspirates are unacceptable for Xpert Xpress SARS-CoV-2/FLU/RSV testing.  Fact Sheet for Patients: EntrepreneurPulse.com.au  Fact Sheet for Healthcare Providers: IncredibleEmployment.be  This test is not yet approved or cleared by the [place] FDA and has been authorized for detection and/or diagnosis of SARS-CoV-2 by FDA under an Emergency Use Authorization (EUA). This EUA will remain in effect (meaning this test can be used) for the duration of the COVID-19 declaration under Section 564(b)(1) of the Act, 21 U.S.C. section 360bbb-3(b)(1), unless the authorization is terminated or revoked.  Performed at [facility], [place] [address] [place], [place] [address]   Lactic acid, plasma     Status: Abnormal   Collection  Time: 07/05/22 12:32 PM  Result Value Ref Range   Lactic Acid, Venous 2.8 (HH) 0.5 - 1.9 mmol/L  Comment: CRITICAL RESULT CALLED TO, READ BACK BY AND VERIFIED WITH PRESSCOTT,M RN AT 1329 07/05/22 BY TIBBITTS, K Performed at [facility], [place] [address] [place], [place] [address]   Protime-INR     Status: None   Collection Time: 07/05/22 12:32 PM  Result Value Ref Range   Prothrombin Time 14.7 11.4 - 15.2 seconds   INR 1.2 0.8 - 1.2    Comment: (NOTE) INR goal varies based on device and disease states. Performed at [facility], [place] [address], [place], [place] [address]   APTT     Status: Abnormal   Collection Time: 07/05/22 12:32 PM  Result Value Ref Range   aPTT <20 (L) 24 - 36 seconds    Comment: Performed at [facility], [place] [address] [place], [place] [address]  Blood Culture (routine x 2)     Status: None (Preliminary result)   Collection Time: 07/05/22 12:32 PM   Specimen: BLOOD  Result Value Ref Range   Specimen Description      BLOOD LEFT ANTECUBITAL Performed at [facility], [place] [address], [place], [place] [address]    Special Requests      BOTTLES DRAWN AEROBIC AND ANAEROBIC Blood Culture adequate volume Performed at [place] [address] [place], [place] [address]    Culture      NO GROWTH < 24 HOURS Performed at [place] [address] [place], [place] [address]    Report Status PENDING   Troponin I (High Sensitivity)     Status: None   Collection Time: 07/05/22 12:32 PM  Result Value Ref Range   Troponin I (High Sensitivity) 3 <18 ng/L    Comment: (NOTE) Elevated high sensitivity troponin I (hsTnI) values and significant  changes across serial measurements may suggest ACS but many other  chronic and acute conditions are known to elevate hsTnI results.  Refer to the "Links" section for chest pain algorithms and additional   guidance. Performed at [facility], [place] [address] [address], [place], [place] [address]   Lactic acid, plasma     Status: None   Collection Time: 07/05/22  7:00 PM  Result Value Ref Range   Lactic Acid, Venous 1.2 0.5 - 1.9 mmol/L    Comment: Performed at [facility], [place] [address], [place], [place] [address]  Basic metabolic panel     Status: Abnormal   Collection Time: 07/06/22  3:29 AM  Result Value Ref Range   Sodium 141 135 - 145 mmol/L   Potassium 4.4 3.5 - 5.1 mmol/L   Chloride 108 98 - 111 mmol/L   CO2 25 22 - 32 mmol/L   Glucose, Bld 198 (H) 70 - 99 mg/dL    Comment: Glucose reference range applies only to samples taken after fasting for at least 8 hours.   BUN 30 (H) 8 - 23 mg/dL   Creatinine, Ser 1.72 (H) 0.61 - 1.24 mg/dL   Calcium 7.9 (L) 8.9 - 10.3 mg/dL   GFR, Estimated 39 (L) >60 mL/min    Comment: (NOTE) Calculated using the CKD-EPI Creatinine Equation (2021)    Anion gap 8 5 - 15    Comment: Performed at [facility], [place] [address], [place],  [address]  CBC     Status: Abnormal   Collection Time: 07/06/22  3:29 AM  Result Value Ref Range   WBC 11.7 (H) 4.0 - 10.5 K/uL  RBC 3.29 (L) 4.22 - 5.81 MIL/uL   Hemoglobin 10.9 (L) 13.0 - 17.0 g/dL   HCT 31.8 (L) 39.0 - 52.0 %   MCV 96.7 80.0 - 100.0 fL   MCH 33.1 26.0 - 34.0 pg   MCHC 34.3 30.0 - 36.0 g/dL   RDW 13.6 11.5 - 15.5 %   Platelets 116 (L) 150 - 400 K/uL   nRBC 0.0 0.0 - 0.2 %    Comment: Performed at [facility], [place] [address] [place], [place] [address]    Imaging / Studies: US Abdomen Limited RUQ (LIVER/GB)  Result Date: 07/05/2022 CLINICAL DATA:  Right upper quadrant pain EXAM: ULTRASOUND ABDOMEN LIMITED RIGHT UPPER QUADRANT COMPARISON:  None Available. FINDINGS: Gallbladder: Cholelithiasis with the largest gallstone measuring 2.4 cm. No pericholecystic fluid. Top-normal gallbladder wall thickness. Negative  sonographic Murphy sign. Common bile duct: Diameter: 5 mm Liver: No focal lesion identified. Within normal limits in parenchymal echogenicity. Portal vein is patent on color Doppler imaging with normal direction of blood flow towards the liver. Other: None. IMPRESSION: 1. Cholelithiasis without sonographic evidence of acute cholecystitis. Electronically Signed   By: Kathreen Devoid M.D.   On: 07/05/2022 11:24   DG Chest 2 View  Result Date: 07/05/2022 CLINICAL DATA:  Pleuritic chest pain. EXAM: CHEST - 2 VIEW COMPARISON:  05/05/2022 FINDINGS: The heart size and mediastinal contours are unremarkable. There is no pleural effusion or edema. No airspace opacities identified. Signs of previous ACDF within the cervical spine. IMPRESSION: No active cardiopulmonary abnormalities. Electronically Signed   By: Kerby Moors M.D.   On: 07/05/2022 11:12    Medications / Allergies: per chart  Antibiotics: Anti-infectives (From admission, onward)    Start     Dose/Rate Route Frequency Ordered Stop   07/05/22 1215  ceFEPIme (MAXIPIME) 2 g in sodium chloride 0.9 % 100 mL IVPB        2 g 200 mL/hr over 30 Minutes Intravenous  Once 07/05/22 1201 07/05/22 1313   07/05/22 1215  metroNIDAZOLE (FLAGYL) IVPB 500 mg        500 mg 100 mL/hr over 60 Minutes Intravenous Every 12 hours 07/05/22 1203           Note: Portions of this report may have been transcribed using voice recognition software. Every effort was made to ensure accuracy; however, inadvertent computerized transcription errors may be present.   Any transcriptional errors that result from this process are unintentional.    Adin Hector, MD, FACS, MASCRS Esophageal, Gastrointestinal & Colorectal Surgery Robotic and Minimally Invasive Surgery  [facility]. [address], [place], [place] [address] [phone] Fax [phone] Main  CONTACT INFORMATION:  Weekday (9AM-5PM):  Call [facility] main office at [phone]  Weeknight (5PM-9AM) or Weekend/Holiday: Check [email or web address] (password " [email or web address]") for General Surgery [facility] coverage  (Please, do not use SecureChat as it is not reliable communication to reach operating surgeons for immediate patient care)      07/06/2022  10:06 AM

## 2022-07-06 NOTE — Consult Note (Signed)
Initial Consultation Note   Patient: Francisco [name] IEP:[id number] DOB: 11-10-1941 PCP: [name], FNP DOA: 07/05/2022 DOS: the patient was seen and examined on 07/06/2022 Primary service: Edison Pace Md, MD Referring physician: Michael Boston, MD Reason for consult: Elevated creatinine, hyperglycemia and medical management.  Assessment and Plan: Principal Problem:   RUQ abdominal pain   Acute calculous cholecystitis   Acute cholecystitis with chronic cholecystitis Continue IV fluids. Continue cefepime 2 g every 8 hours.   Continue metronidazole 500 mg IVPB q 12 hr. Follow CBC and CMP in a.m. Post-op management per general surgery.  Active Problems:   Elevated serum creatinine Superimposed on;   Chronic kidney disease, stage 3a (HCC) Continue IV fluids. Hold diuretic. Avoid hypotension. Avoid nephrotoxins. Monitor intake and output. Monitor renal function electrolytes.    Essential hypertension Continue holding furosemide. Continue metoprolol IVP as needed. Monitor blood pressure and heart rate.    Mixed hyperlipidemia Continue simvastatin 80 mg p.o. daily.    GERD without esophagitis Continue pantoprazole 20 mg p.o. daily.    GAD (generalized anxiety disorder) Advised to request pain control to avoid anxiety. Continue holding alprazolam while on narcotics.    Hyperglycemia Check fasting glucose.    TRH will continue to follow the patient.  HPI: [name] is a 81 y.o. male with past medical history of .  Anxiety, GERD, hypertension, hyperlipidemia who underwent partial cholecystectomy yesterday due to acute on chronic cholecystitis and we are being consulted for hyperglycemia, elevated creatinine level medical management.  He is complaining of surgical site pain, but stated that he has not asked for analgesic recently. He denied fever, chills, rhinorrhea, sore throat, wheezing or hemoptysis.  No chest pain, palpitations, diaphoresis, PND, orthopnea or pitting  edema of the lower extremities.  No flank pain, dysuria, frequency or hematuria.  No polyuria, polydipsia, polyphagia or blurred vision.   Review of Systems: As mentioned in the history of present illness. All other systems reviewed and are negative. Past Medical History:  Diagnosis Date   Anxiety    GERD (gastroesophageal reflux disease)    Hypertension    Past Surgical History:  Procedure Laterality Date   AMPUTATION Left 08/05/2016   Procedure: REVISION AMPUTATION LEFT INDEX FINGER REPAIR LACERATION LEFT THUMB;  Surgeon: [name], MD;  Location: [facility];  Service: Orthopedics;  Laterality: Left;   CHOLECYSTECTOMY N/A 07/05/2022   Procedure: LAPAROSCOPIC CHOLECYSTECTOMY;  Surgeon: Dwan Bolt, MD;  Location: [facility];  Service: General;  Laterality: N/A;   SPINE SURGERY     Social History:  reports that he quit smoking about 50 years ago. His smoking use included cigarettes. He has never used smokeless tobacco. He reports current alcohol use of about 1.0 standard drink of alcohol per week. He reports that he does not use drugs.  Allergies  Allergen Reactions   Crestor [Rosuvastatin] Other (See Comments)    Unknown per Pt   Lipitor [Atorvastatin] Other (See Comments)    Unknown per Pt   Penicillins     REACTION: swelling/hives Has patient had a PCN reaction causing immediate rash, facial/tongue/throat swelling, SOB or lightheadedness with hypotension:yes Has patient had a PCN reaction causing severe rash involving mucus membranes or skin necrosis: Yes Has patient had a PCN reaction that required hospitalization No Has patient had a PCN reaction occurring within the last 10 years: No If all of the above answers are "NO", then may proceed with Cephalosporin use.    Symbicort [Budesonide-Formoterol Fumarate] Other (See Comments)  Pain Behind ribs Unknown per Pt    Family History  Problem Relation Age of Onset   Healthy Mother    Transient ischemic attack Father    Rheum  arthritis Daughter    Amblyopia Neg Hx    Blindness Neg Hx    Cataracts Neg Hx    Glaucoma Neg Hx    Macular degeneration Neg Hx    Retinal detachment Neg Hx    Strabismus Neg Hx    Retinitis pigmentosa Neg Hx     Prior to Admission medications   Medication Sig Start Date End Date Taking? Authorizing Provider  ALPRAZolam (XANAX) 1 MG tablet TAKE 1 TABLET BY MOUTH TWICE  DAILY AS NEEDED FOR ANXIETY OR  SLEEP Patient taking differently: Take 1 mg by mouth 2 (two) times daily. 06/29/22  Yes Hassell Done, Mary-Margaret, FNP  aspirin 81 MG tablet Take 81 mg by mouth daily.   Yes [provider]  escitalopram (LEXAPRO) 10 MG tablet TAKE 2 TABLETS BY MOUTH DAILY Patient taking differently: Take 10 mg by mouth daily. 06/05/22  Yes Martin, Mary-Margaret, FNP  fish oil-omega-3 fatty acids 1000 MG capsule Take 2 g by mouth daily.   Yes [provider]  furosemide (LASIX) 20 MG tablet TAKE 1 TABLET BY MOUTH DAILY 06/15/22  Yes Hassell Done, Mary-Margaret, FNP  meclizine (ANTIVERT) 25 MG tablet TAKE 1 TABLET BY MOUTH 3  TIMES DAILY AS NEEDED FOR  DIZZINESS Patient taking differently: Take 25 mg by mouth 2 (two) times daily. 03/02/22  Yes Hassell Done, Mary-Margaret, FNP  meloxicam (MOBIC) 7.5 MG tablet TAKE 1 TABLET BY MOUTH DAILY 06/25/22  Yes Hassell Done, Mary-Margaret, FNP  metoprolol succinate (TOPROL-XL) 25 MG 24 hr tablet Take 1 tablet (25 mg total) by mouth 2 (two) times daily. 05/04/22  Yes Park Liter, MD  pantoprazole (PROTONIX) 20 MG tablet TAKE 1 TABLET BY MOUTH DAILY Patient taking differently: Take 20 mg by mouth daily. 05/01/22  Yes Martin, Mary-Margaret, FNP  simvastatin (ZOCOR) 80 MG tablet Take 1 tablet (80 mg total) by mouth daily. 12/16/21  Yes Park Liter, MD  tamsulosin (FLOMAX) 0.4 MG CAPS capsule TAKE 1 CAPSULE BY MOUTH DAILY Patient taking differently: Take 0.4 mg by mouth daily. 03/20/22  Yes Hassell Done, Mary-Margaret, FNP  acetaminophen (TYLENOL) 325 MG tablet Take 650 mg by  mouth every 6 (six) hours as needed for mild pain or fever.    [provider]    Physical Exam: Vitals:   07/06/22 0615 07/06/22 0849 07/06/22 1354 07/06/22 1403  BP: 114/66 111/62  (!) 120/57  Pulse: 68 66  62  Resp: '18 17  18  '$ Temp: 98.9 F (37.2 C) 99 F (37.2 C)  97.8 F (36.6 C)  TempSrc: Oral Oral  Oral  SpO2: 98% 95%  97%  Weight:   91 kg   Height:       Physical Exam Vitals and nursing note reviewed.  Constitutional:      General: He is awake.     Appearance: He is well-developed. He is not ill-appearing.  HENT:     Head: Normocephalic.     Mouth/Throat:     Mouth: Mucous membranes are moist.  Eyes:     General: No scleral icterus.    Pupils: Pupils are equal, round, and reactive to light.  Cardiovascular:     Rate and Rhythm: Normal rate and regular rhythm.  Pulmonary:     Effort: Pulmonary effort is normal.     Breath sounds: Normal breath  sounds.  Abdominal:     General: Bowel sounds are normal.     Palpations: Abdomen is soft.     Tenderness: There is abdominal tenderness in the right upper quadrant. There is right CVA tenderness. There is no guarding or rebound.     Hernia: No hernia is present.  Musculoskeletal:     Right lower leg: No edema.     Left lower leg: No edema.  Skin:    General: Skin is warm and dry.  Neurological:     General: No focal deficit present.     Mental Status: He is alert and oriented to person, place, and time.  Psychiatric:        Mood and Affect: Mood normal.        Behavior: Behavior normal. Behavior is cooperative.    Data Reviewed:   Results are pending, will review when available.   Family Communication:  Primary team communication:  Thank you very much for involving [place] in the care of your patient.  Author: Reubin Milan, MD 07/06/2022 2:29 PM  For on call review [email or web address].   This document was prepared using Dragon voice recognition software and may contain some unintended transcription  errors.

## 2022-07-06 NOTE — Progress Notes (Signed)
   07/06/22 1516  Mobility  Activity Refused mobility   Mobility Specialist Cancellation/Refusal Note:   Reason for Cancellation/Refusal: Pt declined mobility at this time. Pt c/o pain. Will check back tomorrow.   [facility]

## 2022-07-06 NOTE — Progress Notes (Addendum)
JP drain dressing soaked with serosangenous, greenish drainage. Dressing changed, pt repositioned comfortably on bed. Needs addressed. Call bell placed with in reach.   Dressing soaked again with greenish,yellowish drainage. Dressing changed again at 6:30 am.

## 2022-07-07 ENCOUNTER — Inpatient Hospital Stay (HOSPITAL_COMMUNITY): Payer: Medicare Other

## 2022-07-07 DIAGNOSIS — N1831 Chronic kidney disease, stage 3a: Secondary | ICD-10-CM

## 2022-07-07 DIAGNOSIS — I700 Atherosclerosis of aorta: Secondary | ICD-10-CM | POA: Diagnosis not present

## 2022-07-07 DIAGNOSIS — R1011 Right upper quadrant pain: Secondary | ICD-10-CM | POA: Diagnosis not present

## 2022-07-07 DIAGNOSIS — D631 Anemia in chronic kidney disease: Secondary | ICD-10-CM | POA: Diagnosis present

## 2022-07-07 DIAGNOSIS — Y838 Other surgical procedures as the cause of abnormal reaction of the patient, or of later complication, without mention of misadventure at the time of the procedure: Secondary | ICD-10-CM | POA: Diagnosis not present

## 2022-07-07 DIAGNOSIS — I129 Hypertensive chronic kidney disease with stage 1 through stage 4 chronic kidney disease, or unspecified chronic kidney disease: Secondary | ICD-10-CM | POA: Diagnosis present

## 2022-07-07 DIAGNOSIS — I739 Peripheral vascular disease, unspecified: Secondary | ICD-10-CM | POA: Diagnosis present

## 2022-07-07 DIAGNOSIS — Z87891 Personal history of nicotine dependence: Secondary | ICD-10-CM | POA: Diagnosis not present

## 2022-07-07 DIAGNOSIS — D649 Anemia, unspecified: Secondary | ICD-10-CM | POA: Diagnosis not present

## 2022-07-07 DIAGNOSIS — K219 Gastro-esophageal reflux disease without esophagitis: Secondary | ICD-10-CM | POA: Diagnosis present

## 2022-07-07 DIAGNOSIS — K9189 Other postprocedural complications and disorders of digestive system: Secondary | ICD-10-CM | POA: Diagnosis not present

## 2022-07-07 DIAGNOSIS — K838 Other specified diseases of biliary tract: Secondary | ICD-10-CM | POA: Diagnosis not present

## 2022-07-07 DIAGNOSIS — F411 Generalized anxiety disorder: Secondary | ICD-10-CM | POA: Diagnosis present

## 2022-07-07 DIAGNOSIS — N400 Enlarged prostate without lower urinary tract symptoms: Secondary | ICD-10-CM | POA: Diagnosis present

## 2022-07-07 DIAGNOSIS — E8809 Other disorders of plasma-protein metabolism, not elsewhere classified: Secondary | ICD-10-CM | POA: Diagnosis present

## 2022-07-07 DIAGNOSIS — R110 Nausea: Secondary | ICD-10-CM | POA: Diagnosis not present

## 2022-07-07 DIAGNOSIS — R32 Unspecified urinary incontinence: Secondary | ICD-10-CM | POA: Diagnosis present

## 2022-07-07 DIAGNOSIS — K812 Acute cholecystitis with chronic cholecystitis: Secondary | ICD-10-CM | POA: Diagnosis present

## 2022-07-07 DIAGNOSIS — I10 Essential (primary) hypertension: Secondary | ICD-10-CM | POA: Diagnosis not present

## 2022-07-07 DIAGNOSIS — E782 Mixed hyperlipidemia: Secondary | ICD-10-CM | POA: Diagnosis present

## 2022-07-07 DIAGNOSIS — R739 Hyperglycemia, unspecified: Secondary | ICD-10-CM | POA: Diagnosis present

## 2022-07-07 DIAGNOSIS — Z79899 Other long term (current) drug therapy: Secondary | ICD-10-CM | POA: Diagnosis not present

## 2022-07-07 DIAGNOSIS — Z9889 Other specified postprocedural states: Secondary | ICD-10-CM | POA: Diagnosis not present

## 2022-07-07 DIAGNOSIS — Z880 Allergy status to penicillin: Secondary | ICD-10-CM | POA: Diagnosis not present

## 2022-07-07 DIAGNOSIS — R0602 Shortness of breath: Secondary | ICD-10-CM | POA: Diagnosis not present

## 2022-07-07 DIAGNOSIS — Z89022 Acquired absence of left finger(s): Secondary | ICD-10-CM | POA: Diagnosis not present

## 2022-07-07 DIAGNOSIS — R7989 Other specified abnormal findings of blood chemistry: Secondary | ICD-10-CM

## 2022-07-07 DIAGNOSIS — R109 Unspecified abdominal pain: Secondary | ICD-10-CM | POA: Diagnosis not present

## 2022-07-07 DIAGNOSIS — D696 Thrombocytopenia, unspecified: Secondary | ICD-10-CM | POA: Diagnosis present

## 2022-07-07 DIAGNOSIS — K8000 Calculus of gallbladder with acute cholecystitis without obstruction: Secondary | ICD-10-CM | POA: Diagnosis not present

## 2022-07-07 DIAGNOSIS — K839 Disease of biliary tract, unspecified: Secondary | ICD-10-CM | POA: Diagnosis not present

## 2022-07-07 DIAGNOSIS — K5909 Other constipation: Secondary | ICD-10-CM | POA: Diagnosis present

## 2022-07-07 DIAGNOSIS — E669 Obesity, unspecified: Secondary | ICD-10-CM | POA: Diagnosis present

## 2022-07-07 DIAGNOSIS — Z7982 Long term (current) use of aspirin: Secondary | ICD-10-CM | POA: Diagnosis not present

## 2022-07-07 DIAGNOSIS — Z888 Allergy status to other drugs, medicaments and biological substances status: Secondary | ICD-10-CM | POA: Diagnosis not present

## 2022-07-07 DIAGNOSIS — K222 Esophageal obstruction: Secondary | ICD-10-CM | POA: Diagnosis present

## 2022-07-07 DIAGNOSIS — Z20822 Contact with and (suspected) exposure to covid-19: Secondary | ICD-10-CM | POA: Diagnosis present

## 2022-07-07 LAB — BASIC METABOLIC PANEL
Anion gap: 6 (ref 5–15)
BUN: 25 mg/dL — ABNORMAL HIGH (ref 8–23)
CO2: 24 mmol/L (ref 22–32)
Calcium: 8.1 mg/dL — ABNORMAL LOW (ref 8.9–10.3)
Chloride: 109 mmol/L (ref 98–111)
Creatinine, Ser: 1.5 mg/dL — ABNORMAL HIGH (ref 0.61–1.24)
GFR, Estimated: 46 mL/min — ABNORMAL LOW (ref >60)
Glucose, Bld: 141 mg/dL — ABNORMAL HIGH (ref 70–99)
Potassium: 4.3 mmol/L (ref 3.5–5.1)
Sodium: 139 mmol/L (ref 135–145)

## 2022-07-07 LAB — GLUCOSE, CAPILLARY
Glucose-Capillary: 126 mg/dL — ABNORMAL HIGH (ref 70–99)
Glucose-Capillary: 155 mg/dL — ABNORMAL HIGH (ref 70–99)
Glucose-Capillary: 152 mg/dL — ABNORMAL HIGH (ref 70–99)
Glucose-Capillary: 150 mg/dL — ABNORMAL HIGH (ref 70–99)

## 2022-07-07 LAB — URINE CULTURE: Culture: NO GROWTH

## 2022-07-07 LAB — CBC
HCT: 33.9 % — ABNORMAL LOW (ref 39.0–52.0)
Hemoglobin: 10.9 g/dL — ABNORMAL LOW (ref 13.0–17.0)
MCH: 32.7 pg (ref 26.0–34.0)
MCHC: 32.2 g/dL (ref 30.0–36.0)
MCV: 101.8 fL — ABNORMAL HIGH (ref 80.0–100.0)
Platelets: 107 10*3/uL — ABNORMAL LOW (ref 150–400)
RBC: 3.33 MIL/uL — ABNORMAL LOW (ref 4.22–5.81)
RDW: 14 % (ref 11.5–15.5)
WBC: 10.4 10*3/uL (ref 4.0–10.5)
nRBC: 0 % (ref 0.0–0.2)

## 2022-07-07 LAB — PHOSPHORUS: Phosphorus: 2.5 mg/dL (ref 2.5–4.6)

## 2022-07-07 LAB — PREALBUMIN: Prealbumin: 16 mg/dL — ABNORMAL LOW (ref 18–38)

## 2022-07-07 LAB — MAGNESIUM: Magnesium: 2.1 mg/dL (ref 1.7–2.4)

## 2022-07-07 LAB — SURGICAL PATHOLOGY

## 2022-07-07 MED ORDER — TECHNETIUM TC 99M MEBROFENIN IV KIT
5.3100 | PACK | Freq: Once | INTRAVENOUS | Status: AC | PRN
  Administered 2022-07-07: 5.31 via INTRAVENOUS

## 2022-07-07 NOTE — Progress Notes (Signed)
Mobility Specialist - Progress Note   07/07/22 1037  Mobility  HOB Elevated/Bed Position Self regulated  Activity Ambulated with assistance in hallway  Range of Motion/Exercises Active  Level of Assistance Contact guard assist, steadying assist  Assistive Device Front wheel walker  Distance Ambulated (ft) 250 ft  Activity Response Tolerated well  Transport method Ambulatory  $Mobility charge 1 Mobility   Pt received in recliner and agreeable to mobility. Pt to bed after session with all needs met.    Francisco [name] Mobility Specialist  

## 2022-07-07 NOTE — Progress Notes (Signed)
PHARMACY - PHYSICIAN COMMUNICATION CRITICAL VALUE ALERT - BLOOD CULTURE IDENTIFICATION (BCID)  Francisco [name] is an 81 y.o. 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  Name of physician (or Provider) Contacted: [place]  Current antibiotics: cefepime  Changes to prescribed antibiotics recommended:  Considered a contaminant, no changes needed  Results for orders placed or performed during the hospital encounter of 05/05/22  Blood Culture ID Panel (Reflexed) (Collected: 05/05/2022  5:35 PM)  Result Value Ref Range   Enterococcus faecalis NOT DETECTED NOT DETECTED   Enterococcus Faecium NOT DETECTED NOT DETECTED   Listeria monocytogenes NOT DETECTED NOT DETECTED   Staphylococcus species NOT DETECTED NOT DETECTED   Staphylococcus aureus (BCID) NOT DETECTED NOT DETECTED   Staphylococcus epidermidis NOT DETECTED NOT DETECTED   Staphylococcus lugdunensis NOT DETECTED NOT DETECTED   Streptococcus species NOT DETECTED NOT DETECTED   Streptococcus agalactiae NOT DETECTED NOT DETECTED   Streptococcus pneumoniae NOT DETECTED NOT DETECTED   Streptococcus pyogenes NOT DETECTED NOT DETECTED   A.calcoaceticus-baumannii NOT DETECTED NOT DETECTED   Bacteroides fragilis NOT DETECTED NOT DETECTED   Enterobacterales DETECTED (A) NOT DETECTED   Enterobacter cloacae complex NOT DETECTED NOT DETECTED   Escherichia coli DETECTED (A) NOT DETECTED   Klebsiella aerogenes NOT DETECTED NOT DETECTED   Klebsiella oxytoca NOT DETECTED NOT DETECTED   Klebsiella pneumoniae NOT DETECTED NOT DETECTED   Proteus species NOT DETECTED NOT DETECTED   Salmonella species NOT DETECTED NOT DETECTED   Serratia marcescens NOT DETECTED NOT DETECTED   Haemophilus influenzae NOT DETECTED NOT DETECTED   Neisseria meningitidis NOT DETECTED NOT DETECTED   Pseudomonas aeruginosa NOT DETECTED NOT DETECTED   Stenotrophomonas  maltophilia NOT DETECTED NOT DETECTED   Candida albicans NOT DETECTED NOT DETECTED   Candida auris NOT DETECTED NOT DETECTED   Candida glabrata NOT DETECTED NOT DETECTED   Candida krusei NOT DETECTED NOT DETECTED   Candida parapsilosis NOT DETECTED NOT DETECTED   Candida tropicalis NOT DETECTED NOT DETECTED   Cryptococcus neoformans/gattii NOT DETECTED NOT DETECTED   CTX-M ESBL NOT DETECTED NOT DETECTED   Carbapenem resistance IMP NOT DETECTED NOT DETECTED   Carbapenem resistance KPC NOT DETECTED NOT DETECTED   Carbapenem resistance NDM NOT DETECTED NOT DETECTED   Carbapenem resist OXA 48 LIKE NOT DETECTED NOT DETECTED   Carbapenem resistance VIM NOT DETECTED NOT DETECTED   Dolly Rias RPh 07/07/2022, 2:09 AM

## 2022-07-07 NOTE — Progress Notes (Signed)
PROGRESS NOTE    Francisco [name]  VEH:[id number] DOB: 09-Oct-1941 DOA: 07/05/2022 PCP: [name], FNP    Brief Narrative:  81 year old male with a history of hypertension, hyperlipidemia, GERD, chronic kidney disease stage IIIa, admitted to the hospital under surgical service for acute on chronic cholecystitis and underwent cholecystectomy.  Hospitalist service was consulted to help address medical issues.   Assessment & Plan:   Principal Problem:   Acute cholecystitis with chronic cholecystitis Active Problems:   Thrombocytopenia (HCC)   Essential hypertension   Chronic kidney disease, stage 3a (HCC)   Mixed hyperlipidemia   GERD without esophagitis   GAD (generalized anxiety disorder)   RUQ abdominal pain   Acute calculous cholecystitis   Elevated serum creatinine   Normocytic anemia   Acute on chronic cholecystitis -Status post cholecystectomy with drain placement -Currently on IV antibiotics with cefepime and metronidazole -Postoperative care per general surgery  Chronic kidney disease stage IIIa -Baseline creatinine approximately 1.5 -Creatinine appears to be near baseline -Continue to monitor -Appears that he is chronically on meloxicam as an outpatient.  Would not resume this on discharge -Outpatient Lasix dose currently on hold.  Resume once he has adequate p.o. intake  Hyperlipidemia -Continue statin  Hypertension -Continued on Toprol -Blood pressure currently stable  GERD -Continue PPI  BPH -Continued on Flomax  Hyperglycemia -Noted to have fasting blood sugar of 126 this -Check A1c   DVT prophylaxis: enoxaparin (LOVENOX) injection 40 mg Start: 07/06/22 0800 SCD's Start: 07/05/22 1507    Subjective: Says he does not feel well today.  Does have some abdominal discomfort today.  He also has a mild nonproductive cough.  Says he has not had a bowel movement  Objective: Vitals:   07/06/22 2101 07/07/22 0500 07/07/22 0506 07/07/22 1129   BP: 125/61  (!) 117/59 136/63  Pulse: 65  60 68  Resp: '18  18 18  '$ Temp: 98.9 F (37.2 C)  98.6 F (37 C) 98 F (36.7 C)  TempSrc: Oral  Oral Oral  SpO2: 94%  94% 96%  Weight:  95 kg    Height:        Intake/Output Summary (Last 24 hours) at 07/07/2022 1542 Last data filed at 07/07/2022 1500 Gross per 24 hour  Intake 1952.56 ml  Output 1120 ml  Net 832.56 ml   Filed Weights   07/05/22 1741 07/06/22 1354 07/07/22 0500  Weight: (!) 173.5 kg 91 kg 95 kg    Examination:  General exam: Appears calm and comfortable  Respiratory system: Clear to auscultation. Respiratory effort normal. Cardiovascular system: S1 & S2 heard, RRR. No JVD, murmurs, rubs, gallops or clicks. No pedal edema. Gastrointestinal system: Abdomen is distended, soft and tender in right upper quadrant. No organomegaly or masses felt. Normal bowel sounds heard.  Drain in place [place] nervous system: Alert and oriented. No focal neurological deficits. Extremities: Symmetric 5 x 5 power. Skin: No rashes, lesions or ulcers Psychiatry: Judgement and insight appear normal. Mood & affect appropriate.     Data Reviewed: I have personally reviewed following labs and imaging studies  CBC: Recent Labs  Lab 07/05/22 1049 07/06/22 0329 07/07/22 0033  WBC 9.3 11.7* 10.4  NEUTROABS 8.0*  --   --   HGB 11.5* 10.9* 10.9*  HCT 34.2* 31.8* 33.9*  MCV 95.8 96.7 101.8*  PLT 135* 116* 836*   Basic Metabolic Panel: Recent Labs  Lab 07/05/22 1049 07/06/22 0329 07/07/22 0033  NA 141 141 139  K 3.7 4.4 4.3  CL 109 108 109  CO2 '26 25 24  '$ GLUCOSE 117* 198* 141*  BUN 26* 30* 25*  CREATININE 1.49* 1.72* 1.50*  CALCIUM 8.1* 7.9* 8.1*  MG  --   --  2.1  PHOS  --   --  2.5   GFR: Estimated Creatinine Clearance: 43.9 mL/min (A) (by C-G formula based on SCr of 1.5 mg/dL (H)). Liver Function Tests: Recent Labs  Lab 07/05/22 1049  AST 15  ALT 12  ALKPHOS 30*  BILITOT 0.6  PROT 5.1*  ALBUMIN 3.2*   Recent Labs   Lab 07/05/22 1049  LIPASE 32   No results for input(s): "AMMONIA" in the last 168 hours. Coagulation Profile: Recent Labs  Lab 07/05/22 1232  INR 1.2   Cardiac Enzymes: No results for input(s): "CKTOTAL", "CKMB", "CKMBINDEX", "TROPONINI" in the last 168 hours. BNP (last 3 results) No results for input(s): "PROBNP" in the last 8760 hours. HbA1C: Recent Labs    07/06/22 0329  HGBA1C 5.5   CBG: Recent Labs  Lab 07/06/22 1138 07/06/22 1713 07/06/22 2055 07/07/22 0720 07/07/22 1131  GLUCAP 160* 132* 180* 126* 155*   Lipid Profile: No results for input(s): "CHOL", "HDL", "LDLCALC", "TRIG", "CHOLHDL", "LDLDIRECT" in the last 72 hours. Thyroid Function Tests: No results for input(s): "TSH", "T4TOTAL", "FREET4", "T3FREE", "THYROIDAB" in the last 72 hours. Anemia Panel: No results for input(s): "VITAMINB12", "FOLATE", "FERRITIN", "TIBC", "IRON", "RETICCTPCT" in the last 72 hours. Sepsis Labs: Recent Labs  Lab 07/05/22 1232 07/05/22 1900  LATICACIDVEN 2.8* 1.2    Recent Results (from the past 240 hour(s))  Resp Panel by RT-PCR (Flu A&B, Covid)     Status: None   Collection Time: 07/05/22 12:32 PM   Specimen: Nasal Swab  Result Value Ref Range Status   SARS Coronavirus 2 by RT PCR NEGATIVE NEGATIVE Final    Comment: (NOTE) SARS-CoV-2 target nucleic acids are NOT DETECTED.  The SARS-CoV-2 RNA is generally detectable in upper respiratory specimens during the acute phase of infection. The lowest concentration of SARS-CoV-2 viral copies this assay can detect is 138 copies/mL. A negative result does not preclude SARS-Cov-2 infection and should not be used as the sole basis for treatment or other patient management decisions. A negative result may occur with  improper specimen collection/handling, submission of specimen other than nasopharyngeal swab, presence of viral mutation(s) within the areas targeted by this assay, and inadequate number of viral copies(<138  copies/mL). A negative result must be combined with clinical observations, patient history, and epidemiological information. The expected result is Negative.  Fact Sheet for Patients:  EntrepreneurPulse.com.au  Fact Sheet for Healthcare Providers:  IncredibleEmployment.be  This test is no t yet approved or cleared by the [place] FDA and  has been authorized for detection and/or diagnosis of SARS-CoV-2 by FDA under an Emergency Use Authorization (EUA). This EUA will remain  in effect (meaning this test can be used) for the duration of the COVID-19 declaration under Section 564(b)(1) of the Act, 21 U.S.C.section 360bbb-3(b)(1), unless the authorization is terminated  or revoked sooner.       Influenza A by PCR NEGATIVE NEGATIVE Final   Influenza B by PCR NEGATIVE NEGATIVE Final    Comment: (NOTE) The Xpert Xpress SARS-CoV-2/FLU/RSV plus assay is intended as an aid in the diagnosis of influenza from Nasopharyngeal swab specimens and should not be used as a sole basis for treatment. Nasal washings and aspirates are unacceptable for Xpert Xpress SARS-CoV-2/FLU/RSV testing.  Fact Sheet for Patients: EntrepreneurPulse.com.au  Fact  Sheet for Healthcare Providers: IncredibleEmployment.be  This test is not yet approved or cleared by the [place] and has been authorized for detection and/or diagnosis of SARS-CoV-2 by FDA under an Emergency Use Authorization (EUA). This EUA will remain in effect (meaning this test can be used) for the duration of the COVID-19 declaration under Section 564(b)(1) of the Act, 21 U.S.C. section 360bbb-3(b)(1), unless the authorization is terminated or revoked.  Performed at [facility] [place] [address], [place], [place] [address]   Blood Culture (routine x 2)     Status: None (Preliminary result)   Collection Time: 07/05/22 12:32 PM   Specimen:  BLOOD  Result Value Ref Range Status   Specimen Description   Final    BLOOD LEFT ANTECUBITAL Performed at [facility] [address] [place], [place] [address]    Special Requests   Final    BOTTLES DRAWN AEROBIC AND ANAEROBIC Blood Culture adequate volume Performed at [facility] [address], [place], [place] [address]    Culture  Setup Time   Final    GRAM POSITIVE RODS AEROBIC BOTTLE ONLY CRITICAL RESULT CALLED TO, READ BACK BY AND VERIFIED WITH: E JACKSON,PHARMD'@0200'$  07/07/22 [place]    Culture   Final    GRAM POSITIVE RODS CULTURE REINCUBATED FOR BETTER GROWTH Performed at [facility], [address], [place], [place] [address]    Report Status PENDING  Incomplete  Blood Culture (routine x 2)     Status: None (Preliminary result)   Collection Time: 07/06/22  3:29 AM   Specimen: BLOOD  Result Value Ref Range Status   Specimen Description   Final    BLOOD BLOOD RIGHT HAND Performed at [facility] [address] [place], [place] [address]    Special Requests   Final    BOTTLES DRAWN AEROBIC ONLY Blood Culture adequate volume Performed at [facility] [address] [place], [place] [address]    Culture   Final    NO GROWTH 1 DAY Performed at [facility], [place] [address], [place], [place] [address]    Report Status PENDING  Incomplete  Urine Culture     Status: None   Collection Time: 07/06/22  2:00 PM   Specimen: Urine, Clean Catch  Result Value Ref Range Status   Specimen Description   Final    URINE, CLEAN CATCH Performed at [facility], [place] [address], [place], [place] [address]    Special Requests   Final    NONE Performed at [facility], [place] [address], [place], [place] [address]    Culture   Final    NO GROWTH Performed at [facility], [place] [address], [place], [place] [address]    Report Status 07/07/2022 FINAL   Final         Radiology Studies: No results found.      Scheduled Meds:  acetaminophen  1,000 mg Oral TID   aspirin EC  81 mg Oral Daily   atorvastatin  40 mg Oral Daily   docusate sodium  100 mg Oral BID   enoxaparin (LOVENOX) injection  40 mg Subcutaneous Q24H   escitalopram  20 mg Oral Daily   insulin aspart  0-15 Units Subcutaneous TID WC   insulin aspart  0-5 Units Subcutaneous QHS   lip balm   Topical BID   metoprolol succinate  25 mg Oral BID  pantoprazole  20 mg Oral Daily   polycarbophil  625 mg Oral BID   tamsulosin  0.4 mg Oral Daily   Continuous Infusions:  ceFEPime (MAXIPIME) IV 2 g (07/07/22 0920)   lactated ringers     lactated ringers Stopped (07/06/22 1119)   methocarbamol (ROBAXIN) IV     metronidazole 500 mg (07/07/22 1146)     LOS: 0 days    Time spent: 43mns    JKathie Dike MD [facility]   If 7PM-7AM, please contact night-coverage [email or web address]  07/07/2022, 3:42 PM

## 2022-07-07 NOTE — Progress Notes (Signed)
Mobility Specialist - Progress Note   07/07/22 1010  Mobility  HOB Elevated/Bed Position Self regulated  Activity Ambulated with assistance in hallway  Range of Motion/Exercises Active  Level of Assistance Contact guard assist, steadying assist  Assistive Device Front wheel walker  Distance Ambulated (ft) 125 ft  Activity Response Tolerated well  Transport method Ambulatory  $Mobility charge 1 Mobility   Pt received in bed and agreeable to mobility.  Pt to recliner after session with all needs met.    [facility]

## 2022-07-07 NOTE — Progress Notes (Addendum)
Nuclear medicine HIDA scan done just finished.  Concerning for leak.  Unfortunately not surprising given his severe cholecystitis and very difficult subtotal Cholecystectomy done by my partner Dr. Zenia Resides.  We will ask gastroenterology to evaluate and see if the patient could benefit from ERCP/stenting to give a chance for the bile leak to heal, especially in this elderly fragile patient.  Looks like the patient has had GI care done by [facility], Dr. Scarlette Shorts.  I was able to reach Dr. Rush Landmark.  He believes Dr. Fuller Plan is covering ERCP at [facility] long.  He thinks it is reasonable to make the patient n.p.o. for possible ERCP tomorrow afternoon, although Thursday is more likely.  We will see.

## 2022-07-08 DIAGNOSIS — E782 Mixed hyperlipidemia: Secondary | ICD-10-CM

## 2022-07-08 DIAGNOSIS — K8000 Calculus of gallbladder with acute cholecystitis without obstruction: Secondary | ICD-10-CM | POA: Diagnosis not present

## 2022-07-08 DIAGNOSIS — K838 Other specified diseases of biliary tract: Secondary | ICD-10-CM | POA: Diagnosis not present

## 2022-07-08 DIAGNOSIS — I10 Essential (primary) hypertension: Secondary | ICD-10-CM

## 2022-07-08 DIAGNOSIS — D649 Anemia, unspecified: Secondary | ICD-10-CM

## 2022-07-08 DIAGNOSIS — R1011 Right upper quadrant pain: Secondary | ICD-10-CM

## 2022-07-08 DIAGNOSIS — K812 Acute cholecystitis with chronic cholecystitis: Secondary | ICD-10-CM | POA: Diagnosis not present

## 2022-07-08 DIAGNOSIS — R7989 Other specified abnormal findings of blood chemistry: Secondary | ICD-10-CM | POA: Diagnosis not present

## 2022-07-08 DIAGNOSIS — N1831 Chronic kidney disease, stage 3a: Secondary | ICD-10-CM | POA: Diagnosis not present

## 2022-07-08 DIAGNOSIS — K219 Gastro-esophageal reflux disease without esophagitis: Secondary | ICD-10-CM

## 2022-07-08 DIAGNOSIS — F411 Generalized anxiety disorder: Secondary | ICD-10-CM

## 2022-07-08 DIAGNOSIS — K9189 Other postprocedural complications and disorders of digestive system: Secondary | ICD-10-CM

## 2022-07-08 DIAGNOSIS — D696 Thrombocytopenia, unspecified: Secondary | ICD-10-CM

## 2022-07-08 LAB — GLUCOSE, CAPILLARY
Glucose-Capillary: 138 mg/dL — ABNORMAL HIGH (ref 70–99)
Glucose-Capillary: 140 mg/dL — ABNORMAL HIGH (ref 70–99)
Glucose-Capillary: 175 mg/dL — ABNORMAL HIGH (ref 70–99)

## 2022-07-08 LAB — BASIC METABOLIC PANEL
Anion gap: 7 (ref 5–15)
BUN: 19 mg/dL (ref 8–23)
CO2: 25 mmol/L (ref 22–32)
Calcium: 8.7 mg/dL — ABNORMAL LOW (ref 8.9–10.3)
Chloride: 110 mmol/L (ref 98–111)
Creatinine, Ser: 1.2 mg/dL (ref 0.61–1.24)
GFR, Estimated: >60 mL/min (ref >60)
Glucose, Bld: 130 mg/dL — ABNORMAL HIGH (ref 70–99)
Potassium: 4.1 mmol/L (ref 3.5–5.1)
Sodium: 142 mmol/L (ref 135–145)

## 2022-07-08 LAB — PHOSPHORUS: Phosphorus: 2.4 mg/dL — ABNORMAL LOW (ref 2.5–4.6)

## 2022-07-08 LAB — HEPATIC FUNCTION PANEL
ALT: 180 U/L — ABNORMAL HIGH (ref 0–44)
AST: 39 U/L (ref 15–41)
Albumin: 3 g/dL — ABNORMAL LOW (ref 3.5–5.0)
Alkaline Phosphatase: 51 U/L (ref 38–126)
Bilirubin, Direct: 0.2 mg/dL (ref 0.0–0.2)
Indirect Bilirubin: 0.4 mg/dL (ref 0.3–0.9)
Total Bilirubin: 0.6 mg/dL (ref 0.3–1.2)
Total Protein: 5.6 g/dL — ABNORMAL LOW (ref 6.5–8.1)

## 2022-07-08 LAB — CBC WITH DIFFERENTIAL/PLATELET
Abs Immature Granulocytes: 0.07 10*3/uL (ref 0.00–0.07)
Basophils Absolute: 0 10*3/uL (ref 0.0–0.1)
Basophils Relative: 0 %
Eosinophils Absolute: 0.1 10*3/uL (ref 0.0–0.5)
Eosinophils Relative: 1 %
HCT: 33.3 % — ABNORMAL LOW (ref 39.0–52.0)
Hemoglobin: 11.4 g/dL — ABNORMAL LOW (ref 13.0–17.0)
Immature Granulocytes: 1 %
Lymphocytes Relative: 6 %
Lymphs Abs: 0.6 10*3/uL — ABNORMAL LOW (ref 0.7–4.0)
MCH: 32.6 pg (ref 26.0–34.0)
MCHC: 34.2 g/dL (ref 30.0–36.0)
MCV: 95.1 fL (ref 80.0–100.0)
Monocytes Absolute: 0.5 10*3/uL (ref 0.1–1.0)
Monocytes Relative: 5 %
Neutro Abs: 8.9 10*3/uL — ABNORMAL HIGH (ref 1.7–7.7)
Neutrophils Relative %: 87 %
Platelets: 126 10*3/uL — ABNORMAL LOW (ref 150–400)
RBC: 3.5 MIL/uL — ABNORMAL LOW (ref 4.22–5.81)
RDW: 13.3 % (ref 11.5–15.5)
WBC: 10.1 10*3/uL (ref 4.0–10.5)
nRBC: 0 % (ref 0.0–0.2)

## 2022-07-08 LAB — HEMOGLOBIN A1C
Hgb A1c MFr Bld: 5.3 % (ref 4.8–5.6)
Mean Plasma Glucose: 105.41 mg/dL

## 2022-07-08 LAB — MAGNESIUM: Magnesium: 2 mg/dL (ref 1.7–2.4)

## 2022-07-08 MED ORDER — ALPRAZOLAM 0.5 MG PO TABS
1.0000 mg | ORAL_TABLET | Freq: Two times a day (BID) | ORAL | Status: DC | PRN
  Administered 2022-07-08 – 2022-07-13 (×4): 1 mg via ORAL
  Filled 2022-07-08 (×4): qty 2

## 2022-07-08 MED ORDER — BISACODYL 10 MG RE SUPP
10.0000 mg | Freq: Once | RECTAL | Status: AC
Start: 2022-07-08 — End: 2022-07-08
  Administered 2022-07-08: 10 mg via RECTAL

## 2022-07-08 MED ORDER — INDOMETHACIN 50 MG RE SUPP: 100.0000 mg | Freq: Once | RECTAL | Status: DC | PRN

## 2022-07-08 NOTE — Progress Notes (Signed)
Spoke w pt's daugther [name], who is pt's POA [phone]). Discussed pt's ERCP for tomorrow and also d/c plans, as pt lives alone. She stated that her sister Randell Patient is close by and is willing to stay w pt after d/c (bc pt is confused and will have 2 separate drains to take care of).

## 2022-07-08 NOTE — Progress Notes (Signed)
PROGRESS NOTE    Francisco [name]  LAG:[id number] DOB: Jun 12, 1941 DOA: 07/05/2022 PCP: [name], FNP   Brief Narrative:  The patient is an 81 year old Caucasian male with past medical history significant for but limited to hypertension, hyperlipidemia, GERD, chronic kidney disease stage IIIa, as well as other comorbidities who was admitted to the hospital under surgical service for acute on chronic cholecystitis and underwent cholecystectomy.  Postoperatively patient ended up having a bile leak and hospitalist service was consulted to help address his medical issues.  He has been consulted for his subsequent bile leak and he has a percutaneous biliary drain in place.  He continues to have significant discomfort and is on IV cefepime and Flagyl and the plan is for ERCP tomorrow with possibly biliary stenting and decompression of bile duct to help the bile leak seal.  Assessment and Plan: No notes have been filed under this hospital service. Service: Hospitalist  Acute on chronic cholecystitis with complication of a bile leak -Status post cholecystectomy with drain placement -Currently on IV antibiotics with cefepime and metronidazole which we will continue -Patient underwent a nuclear medicine HIDA scan which was concerning for a leak and general surgery is not a surprise given his severe cholecystitis and difficult subtotal cholecystectomy and have asked GI to evaluate for ERCP and stenting and plan is for ERCP with likely biliary stent and decompress the bile duct to help the bile leak still. -Postoperative care per general surgery   Chronic kidney disease stage IIIa -Baseline creatinine approximately 1.5 -Creatinine appears to be near baseline and now BUNs/creatinine was 19/1.20 -Continue to monitor -Appears that he is chronically on meloxicam as an outpatient.  Would not resume this on discharge -Outpatient Lasix dose currently on hold.  Resume once he has adequate p.o.  intake -Avoid further nephrotoxic medications, contrast dyes, hypotension and dehydration to ensure adequate renal perfusion if possible and renally dose medications -Repeat CMP in a.m.   Hyperlipidemia -Continued statin but may need to hold given his slightly abnormal LFTs with an elevated ALT   Hypertension -Continued on Toprol -Blood pressure currently stable we will need to monitor blood pressures carefully   GERD/GI prophylaxis -Continue PPI   BPH -Continued on Flomax   Hyperglycemia -Noted to have fasting blood sugar of 126 this -Check A1c and was not elevated and was 5.3  Hypophosphatemia -Patient's Phos level is now 2.4 -Replete with p.o. K Phos neutral 500 mg x 1 -Continue monitor and trend and repeat Phos level in a.m.  Hypoalbuminemia -Patient's albumin level is 3.0 -Continue monitor and trend and repeat CMP in a.m.  Macrocytic anemia/normocytic anemia -Patient's hemoglobin/hematocrit went from 10.9/33.9 with a MCV of 101.8 and is now 11.4/33.3 with an MCV of 95.1 -Check anemia panel in the a.m. -Continue monitor for signs and symptoms bleeding; no overt bleeding noted -Repeat CBC in a.m.  Thrombocytopenia -Patient's platelet count has gone from 107 is now 126 -Continue monitor and trend and evaluate for signs and symptoms of bleeding and repeat CBC in a.m.  DVT prophylaxis: SCD's Start: 07/05/22 1507    Code Status: Full Code Family Communication: No family currently at bedside  Disposition Plan:  Level of care: Med-Surg Status is: Inpatient Remains inpatient appropriate because: He is further clinical evaluation for his bile leak and work-up and will need to be stable from a GI perspective as well as a surgery perspective prior to discharge    Consultants:  General surgery Gastroenterology [facility] hospitalist  Procedures:  Procedure: Laparoscopic  subtotal fenestrating cholecystectomy with drain placement  Antimicrobials:  Anti-infectives (From  admission, onward)    Start     Dose/Rate Route Frequency Ordered Stop   07/06/22 1245  ceFEPIme (MAXIPIME) 2 g in sodium chloride 0.9 % 100 mL IVPB        2 g 200 mL/hr over 30 Minutes Intravenous Every 12 hours 07/06/22 1158     07/05/22 1215  ceFEPIme (MAXIPIME) 2 g in sodium chloride 0.9 % 100 mL IVPB        2 g 200 mL/hr over 30 Minutes Intravenous  Once 07/05/22 1201 07/05/22 1313   07/05/22 1215  metroNIDAZOLE (FLAGYL) IVPB 500 mg        500 mg 100 mL/hr over 60 Minutes Intravenous Every 12 hours 07/05/22 1203         Subjective: Seen and examined at bedside and he was still having some abdominal discomfort.  Denies any chest pain or shortness of breath.  Awaiting to go to ERCP and understand that he has a bile leak.  No other concerns or complaints at this time.  Objective: Vitals:   07/08/22 0413 07/08/22 0416 07/08/22 0425 07/08/22 1431  BP:  (!) 167/95 (!) 150/90 (!) 180/95  Pulse:  66  70  Resp:  20  20  Temp:  98.9 F (37.2 C)  99.1 F (37.3 C)  TempSrc:    Oral  SpO2:  95%  97%  Weight: 95.7 kg     Height:        Intake/Output Summary (Last 24 hours) at 07/08/2022 1739 Last data filed at 07/08/2022 1719 Gross per 24 hour  Intake 2427.28 ml  Output 1255 ml  Net 1172.28 ml   Filed Weights   07/06/22 1354 07/07/22 0500 07/08/22 0413  Weight: 91 kg 95 kg 95.7 kg   Examination: Physical Exam:  Constitutional: WN/WD obese elderly Caucasian male in no acute distress Respiratory: Slightly diminished to auscultation bilaterally, no wheezing, rales, rhonchi or crackles. Normal respiratory effort and patient is not tachypenic. No accessory muscle use.  Unlabored breathing Cardiovascular: RRR, no murmurs / rubs / gallops. S1 and S2 auscultated. No extremity edema.  Abdomen: Soft, non-tender distended secondary body habitus and has a drain in place.  Bowel sounds positive.  GU: Deferred. Musculoskeletal: No clubbing / cyanosis of digits/nails. No joint deformity  upper and lower extremities.  Skin: No rashes, lesions, ulcers on limited skin evaluation. No induration; Warm and dry.  Neurologic: CN 2-12 grossly intact with no focal deficits.  Romberg sign and cerebellar reflexes not assessed.  Psychiatric: Normal judgment and insight. Alert and oriented x 3. Normal mood and appropriate affect.   Data Reviewed: I have personally reviewed following labs and imaging studies  CBC: Recent Labs  Lab 07/05/22 1049 07/06/22 0329 07/07/22 0033 07/08/22 1024  WBC 9.3 11.7* 10.4 10.1  NEUTROABS 8.0*  --   --  8.9*  HGB 11.5* 10.9* 10.9* 11.4*  HCT 34.2* 31.8* 33.9* 33.3*  MCV 95.8 96.7 101.8* 95.1  PLT 135* 116* 107* 627*   Basic Metabolic Panel: Recent Labs  Lab 07/05/22 1049 07/06/22 0329 07/07/22 0033 07/08/22 0453  NA 141 141 139 142  K 3.7 4.4 4.3 4.1  CL 109 108 109 110  CO2 '26 25 24 25  '$ GLUCOSE 117* 198* 141* 130*  BUN 26* 30* 25* 19  CREATININE 1.49* 1.72* 1.50* 1.20  CALCIUM 8.1* 7.9* 8.1* 8.7*  MG  --   --  2.1 2.0  PHOS  --   --  2.5 2.4*   GFR: Estimated Creatinine Clearance: 55.1 mL/min (by C-G formula based on SCr of 1.2 mg/dL). Liver Function Tests: Recent Labs  Lab 07/05/22 1049 07/08/22 0453  AST 15 39  ALT 12 180*  ALKPHOS 30* 51  BILITOT 0.6 0.6  PROT 5.1* 5.6*  ALBUMIN 3.2* 3.0*   Recent Labs  Lab 07/05/22 1049  LIPASE 32   No results for input(s): "AMMONIA" in the last 168 hours. Coagulation Profile: Recent Labs  Lab 07/05/22 1232  INR 1.2   Cardiac Enzymes: No results for input(s): "CKTOTAL", "CKMB", "CKMBINDEX", "TROPONINI" in the last 168 hours. BNP (last 3 results) No results for input(s): "PROBNP" in the last 8760 hours. HbA1C: Recent Labs    07/06/22 0329 07/08/22 0453  HGBA1C 5.5 5.3   CBG: Recent Labs  Lab 07/07/22 1653 07/07/22 2240 07/08/22 0732 07/08/22 1117 07/08/22 1624  GLUCAP 152* 150* 138* 140* 175*   Lipid Profile: No results for input(s): "CHOL", "HDL", "LDLCALC",  "TRIG", "CHOLHDL", "LDLDIRECT" in the last 72 hours. Thyroid Function Tests: No results for input(s): "TSH", "T4TOTAL", "FREET4", "T3FREE", "THYROIDAB" in the last 72 hours. Anemia Panel: No results for input(s): "VITAMINB12", "FOLATE", "FERRITIN", "TIBC", "IRON", "RETICCTPCT" in the last 72 hours. Sepsis Labs: Recent Labs  Lab 07/05/22 1232 07/05/22 1900  LATICACIDVEN 2.8* 1.2    Recent Results (from the past 240 hour(s))  Resp Panel by RT-PCR (Flu A&B, Covid)     Status: None   Collection Time: 07/05/22 12:32 PM   Specimen: Nasal Swab  Result Value Ref Range Status   SARS Coronavirus 2 by RT PCR NEGATIVE NEGATIVE Final    Comment: (NOTE) SARS-CoV-2 target nucleic acids are NOT DETECTED.  The SARS-CoV-2 RNA is generally detectable in upper respiratory specimens during the acute phase of infection. The lowest concentration of SARS-CoV-2 viral copies this assay can detect is 138 copies/mL. A negative result does not preclude SARS-Cov-2 infection and should not be used as the sole basis for treatment or other patient management decisions. A negative result may occur with  improper specimen collection/handling, submission of specimen other than nasopharyngeal swab, presence of viral mutation(s) within the areas targeted by this assay, and inadequate number of viral copies(<138 copies/mL). A negative result must be combined with clinical observations, patient history, and epidemiological information. The expected result is Negative.  Fact Sheet for Patients:  EntrepreneurPulse.com.au  Fact Sheet for Healthcare Providers:  IncredibleEmployment.be  This test is no t yet approved or cleared by the [place] FDA and  has been authorized for detection and/or diagnosis of SARS-CoV-2 by FDA under an Emergency Use Authorization (EUA). This EUA will remain  in effect (meaning this test can be used) for the duration of the COVID-19 declaration  under Section 564(b)(1) of the Act, 21 U.S.C.section 360bbb-3(b)(1), unless the authorization is terminated  or revoked sooner.       Influenza A by PCR NEGATIVE NEGATIVE Final   Influenza B by PCR NEGATIVE NEGATIVE Final    Comment: (NOTE) The Xpert Xpress SARS-CoV-2/FLU/RSV plus assay is intended as an aid in the diagnosis of influenza from Nasopharyngeal swab specimens and should not be used as a sole basis for treatment. Nasal washings and aspirates are unacceptable for Xpert Xpress SARS-CoV-2/FLU/RSV testing.  Fact Sheet for Patients: EntrepreneurPulse.com.au  Fact Sheet for Healthcare Providers: IncredibleEmployment.be  This test is not yet approved or cleared by the [place] FDA and has been authorized for detection and/or diagnosis of SARS-CoV-2 by FDA under an Emergency Use Authorization (  EUA). This EUA will remain in effect (meaning this test can be used) for the duration of the COVID-19 declaration under Section 564(b)(1) of the Act, 21 U.S.C. section 360bbb-3(b)(1), unless the authorization is terminated or revoked.  Performed at [facility], [place] [address] [place], [place] [address]   Blood Culture (routine x 2)     Status: None (Preliminary result)   Collection Time: 07/05/22 12:32 PM   Specimen: BLOOD  Result Value Ref Range Status   Specimen Description   Final    BLOOD LEFT ANTECUBITAL Performed at [facility] [address], [place], [place] [address]    Special Requests   Final    BOTTLES DRAWN AEROBIC AND ANAEROBIC Blood Culture adequate volume Performed at [facility] [address], [place], [place] [address]    Culture  Setup Time   Final    GRAM POSITIVE RODS AEROBIC BOTTLE ONLY CRITICAL RESULT CALLED TO, READ BACK BY AND VERIFIED WITH: E JACKSON,PHARMD'@0200'$  07/07/22 Ko [place]    Culture   Final    GRAM POSITIVE RODS CULTURE REINCUBATED FOR BETTER  GROWTH Performed at [facility], [place] [address], [place], [place] [address]    Report Status PENDING  Incomplete  Blood Culture (routine x 2)     Status: None (Preliminary result)   Collection Time: 07/06/22  3:29 AM   Specimen: BLOOD  Result Value Ref Range Status   Specimen Description   Final    BLOOD BLOOD RIGHT HAND Performed at [facility] [address], [place], [place] [address]    Special Requests   Final    BOTTLES DRAWN AEROBIC ONLY Blood Culture adequate volume Performed at [facility] [address] [place], [place] [address]    Culture   Final    NO GROWTH 2 DAYS Performed at [facility] [address], [place], [place] [address]    Report Status PENDING  Incomplete  Urine Culture     Status: None   Collection Time: 07/06/22  2:00 PM   Specimen: Urine, Clean Catch  Result Value Ref Range Status   Specimen Description   Final    URINE, CLEAN CATCH Performed at [facility], [place] [address], [place], [place] [address]    Special Requests   Final    NONE Performed at [facility], [place] [address], [place], [place] [address]    Culture   Final    NO GROWTH Performed at [facility], [place] [address] [place], [place] [address]    Report Status 07/07/2022 FINAL  Final     Radiology Studies: NM HEPATOBILIARY LEAK (POST-SURGICAL)  Result Date: 07/07/2022 CLINICAL DATA:  Subtotal cholecystectomy performed. Postop day 1. Surgical drain in place. Concern for biliary leak EXAM: NUCLEAR MEDICINE HEPATOBILIARY IMAGING TECHNIQUE: Sequential images of the abdomen were obtained out to 60 minutes following intravenous administration of radiopharmaceutical. RADIOPHARMACEUTICALS:  5.3 mCi Tc-42m Choletec IV COMPARISON:  Ultrasound 07/05/2022 FINDINGS: Prompt clearance of radiotracer from blood pool and homogeneous uptake in liver. Counts are evident in the common bile duct and  small bowel by 20 minutes. At 15 minutes, counts begin to collect along the inferior margin of the RIGHT hepatic lobe. These counts spill into the peritoneal space along the RIGHT pericolic gutter consistent with biliary leak. IMPRESSION: Biliary leak with biliary excreted radiotracer collecting in the gallbladder fossa and spilling into the RIGHT pericolic gutter. Patent  common bile duct. These results will be called to the ordering clinician or representative by the Radiologist Assistant, and communication documented in the PACS or [employer]. Electronically Signed   By: Suzy Bouchard M.D.   On: 07/07/2022 16:56     Scheduled Meds:  acetaminophen  1,000 mg Oral TID   aspirin EC  81 mg Oral Daily   atorvastatin  40 mg Oral Daily   docusate sodium  100 mg Oral BID   escitalopram  20 mg Oral Daily   insulin aspart  0-15 Units Subcutaneous TID WC   insulin aspart  0-5 Units Subcutaneous QHS   lip balm   Topical BID   metoprolol succinate  25 mg Oral BID   pantoprazole  20 mg Oral Daily   polycarbophil  625 mg Oral BID   tamsulosin  0.4 mg Oral Daily   Continuous Infusions:  ceFEPime (MAXIPIME) IV 2 g (07/08/22 0907)   lactated ringers 75 mL/hr at 07/07/22 2125   methocarbamol (ROBAXIN) IV     metronidazole 500 mg (07/08/22 1300)    LOS: 1 day   Raiford Noble, DO [facility] Available via Epic secure chat 7am-7pm After these hours, please refer to coverage provider listed on [email or web address] 07/08/2022, 5:39 PM

## 2022-07-08 NOTE — Progress Notes (Signed)
CAMRY THEISS [id number] 30-Oct-1941  CARE TEAM:  PCP: [name], FNP  Outpatient Care Team: Patient Care Team: [name], FNP as PCP - General (Nurse Practitioner)  Inpatient Treatment Team: Treatment Team: Attending Provider: Nolon Nations, MD; Consulting Physician: Edison Pace, Md, MD; Rounding Team: Fatima Blank, MD; Consulting Physician: Kathie Dike, MD; Consulting Physician: Mansouraty, Telford Nab., MD; Nurse Practitioner: Willia Craze, NP; Consulting Physician: Ladene Artist, MD; Registered Nurse: Dorinda Hill, RN; Social Worker: Evans Lance; Pharmacist: Eudelia Bunch [facility]; Mobility Specialist: Trevor Mace   Problem List:   Principal Problem:   Acute cholecystitis with chronic cholecystitis Active Problems:   Essential hypertension   GAD (generalized anxiety disorder)   GERD without esophagitis   Mixed hyperlipidemia   Thrombocytopenia (HCC)   Chronic kidney disease, stage 3a ([place])   RUQ abdominal pain   Acute calculous cholecystitis   Elevated serum creatinine   Normocytic anemia   3 Days Post-Op  07/05/2022  Preoperative Diagnosis: Symptomatic cholelithiasis  Postoperative Diagnosis: Acute on chronic cholecystitis   Procedure: Laparoscopic subtotal fenestrating cholecystectomy with drain placement   Surgeon: Michaelle Birks, MD   Findings:  Acute on chronic cholecystitis with pus within the gallbladder. Fibrotic inflammation of the cystic triangle prevented safe dissection of the cystic duct, so a subtotal cholecystectomy was performed.   Assessment  Bile leak controlled with drain for now  [facility] Stay = 1 days)  Plan:  -Continue drain for now.    Nuclear medicine HIDA scan confirms bile leak in surgical field which enforces not surprising given the severely difficult subtotal cholecystectomy that was possible.  Awaiting input from gastroenterology to consider ERCP stenting in the next day or so.     -Continue antibiotics.  -Solid diet as tolerated.  Improved pain control.   Better with oxycodone.  Scheduled Tylenol but follow LFTs.  Follow up pathology.  He is rather hyperglycemic but denies diabetes.  Sliding scale insulin for now.  He has chronic kidney disease -creatinine seems to be normalizing to its 1.3 which is a guardedly hopeful sign.    Although the patient is worried and concerned and disappointed he is not normal, he is stable for now.  Hopefully with the ERCP and stenting and drain things will gradually improve.  He is very deconditioned which means his recovery will be prolonged.  But so far no major scary events.  I tried to reassure him.  He seemed mostly reassured   -VTE prophylaxis- SCDs, etc  -mobilize as tolerated to help recovery.  See what physical Occupational Therapy think.  Disposition: Patient normally lives by himself.  Apparently their daughter is nearby.  I do not think it safe for him to go home by himself.  Suspect he may need a SNF.  Will need input from therapy to see.  I have asked social work to again see this patient and not sign off.  Disposition:  The patient is from: Home  Anticipate discharge to:  [place] (SNF)  Anticipated Date of Discharge is:  August 25,2023    Barriers to discharge:  Pending Clinical improvement (more likely than not)  Patient currently is NOT MEDICALLY STABLE for discharge from the hospital from a surgery standpoint.      I reviewed nursing notes, last 24 h vitals and pain scores, last 48 h intake and output, last 24 h labs and trends, and last 24 h imaging results. I have reviewed this patient's available data, including medical history,  events of note, test results, etc as part of my evaluation.  A significant portion of that time was spent in counseling.  Care during the described time interval was provided by me.  This care required high  level of medical decision making.   07/08/2022    Subjective: (Chief complaint)  Worried that he is not getting better fast enough.  Some nausea and soreness but medicines help.  Sitting up in bed.  Objective:  Vital signs:  Vitals:   07/07/22 2225 07/08/22 0413 07/08/22 0416 07/08/22 0425  BP: (!) 152/77  (!) 167/95 (!) 150/90  Pulse: (!) 56  66   Resp: 18  20   Temp: 98.4 F (36.9 C)  98.9 F (37.2 C)   TempSrc: Oral     SpO2: 97%  95%   Weight:  95.7 kg    Height:        Last BM Date : 07/04/22  Intake/Output   Yesterday:  08/22 0701 - 08/23 0700 In: 2950.5 [P.O.:300; I.V.:2034.8; IV Piggyback:615.7] Out: 875 [Urine:750; Drains:125] This shift:  Total I/O In: 0  Out: 390 [Urine:350; Drains:40]  Bowel function:  Flatus: No  BM:  No  Drain:  Thinly bilious.   Physical Exam:  General: Pt awake/alert in mild acute distress.  Anxious but consolable. Eyes: PERRL, normal EOM.  Sclera clear.  No icterus Neuro: CN II-XII intact w/o focal sensory/motor deficits. Lymph: No head/neck/groin lymphadenopathy Psych:  No delerium/psychosis/paranoia.  Oriented x 4 HENT: Normocephalic, Mucus membranes moist.  No thrush Neck: Supple, No tracheal deviation.  No obvious thyromegaly Chest: No pain to chest wall compression.  Good respiratory excursion.  No audible wheezing CV:  Pulses intact.  Regular rhythm.  No major extremity edema MS: Normal AROM mjr joints.  No obvious deformity  Abdomen: Soft.  Obese.  Mildy distended.  Tenderness at right upper quadrant drain site. .  No evidence of peritonitis.  No incarcerated hernias.  Ext:   No deformity.  No mjr edema.  No cyanosis Skin: No petechiae / purpurea.  No major sores.  Warm and dry    Results:   Cultures: Recent Results (from the past 720 hour(s))  Resp Panel by RT-PCR (Flu A&B, Covid)     Status: None   Collection Time: 07/05/22 12:32 PM   Specimen: Nasal Swab  Result Value Ref Range Status   SARS Coronavirus 2 by RT PCR NEGATIVE NEGATIVE  Final    Comment: (NOTE) SARS-CoV-2 target nucleic acids are NOT DETECTED.  The SARS-CoV-2 RNA is generally detectable in upper respiratory specimens during the acute phase of infection. The lowest concentration of SARS-CoV-2 viral copies this assay can detect is 138 copies/mL. A negative result does not preclude SARS-Cov-2 infection and should not be used as the sole basis for treatment or other patient management decisions. A negative result may occur with  improper specimen collection/handling, submission of specimen other than nasopharyngeal swab, presence of viral mutation(s) within the areas targeted by this assay, and inadequate number of viral copies(<138 copies/mL). A negative result must be combined with clinical observations, patient history, and epidemiological information. The expected result is Negative.  Fact Sheet for Patients:  EntrepreneurPulse.com.au  Fact Sheet for Healthcare Providers:  IncredibleEmployment.be  This test is no t yet approved or cleared by the [place] FDA and  has been authorized for detection and/or diagnosis of SARS-CoV-2 by FDA under an Emergency Use Authorization (EUA). This EUA will remain  in effect (meaning this test can be used)  for the duration of the COVID-19 declaration under Section 564(b)(1) of the Act, 21 U.S.C.section 360bbb-3(b)(1), unless the authorization is terminated  or revoked sooner.       Influenza A by PCR NEGATIVE NEGATIVE Final   Influenza B by PCR NEGATIVE NEGATIVE Final    Comment: (NOTE) The Xpert Xpress SARS-CoV-2/FLU/RSV plus assay is intended as an aid in the diagnosis of influenza from Nasopharyngeal swab specimens and should not be used as a sole basis for treatment. Nasal washings and aspirates are unacceptable for Xpert Xpress SARS-CoV-2/FLU/RSV testing.  Fact Sheet for Patients: EntrepreneurPulse.com.au  Fact Sheet for Healthcare  Providers: IncredibleEmployment.be  This test is not yet approved or cleared by the [place] FDA and has been authorized for detection and/or diagnosis of SARS-CoV-2 by FDA under an Emergency Use Authorization (EUA). This EUA will remain in effect (meaning this test can be used) for the duration of the COVID-19 declaration under Section 564(b)(1) of the Act, 21 U.S.C. section 360bbb-3(b)(1), unless the authorization is terminated or revoked.  Performed at [facility], [place] [address] [place], [place] [address]   Blood Culture (routine x 2)     Status: None (Preliminary result)   Collection Time: 07/05/22 12:32 PM   Specimen: BLOOD  Result Value Ref Range Status   Specimen Description   Final    BLOOD LEFT ANTECUBITAL Performed at [place] [address], [place], [place] [address]    Special Requests   Final    BOTTLES DRAWN AEROBIC AND ANAEROBIC Blood Culture adequate volume Performed at [facility] [address] [place], [place] [address]    Culture  Setup Time   Final    GRAM POSITIVE RODS AEROBIC BOTTLE ONLY CRITICAL RESULT CALLED TO, READ BACK BY AND VERIFIED WITH: E JACKSON,PHARMD'@0200'$  07/07/22 [place]    Culture   Final    GRAM POSITIVE RODS CULTURE REINCUBATED FOR BETTER GROWTH Performed at [facility], [address], [place], [place] [address]    Report Status PENDING  Incomplete  Blood Culture (routine x 2)     Status: None (Preliminary result)   Collection Time: 07/06/22  3:29 AM   Specimen: BLOOD  Result Value Ref Range Status   Specimen Description   Final    BLOOD BLOOD RIGHT HAND Performed at [place] [address], [place], [place] [address]    Special Requests   Final    BOTTLES DRAWN AEROBIC ONLY Blood Culture adequate volume Performed at [facility] [address], [place], [place] [address]    Culture   Final     NO GROWTH 2 DAYS Performed at [place] [address], [place], [place] [address]    Report Status PENDING  Incomplete  Urine Culture     Status: None   Collection Time: 07/06/22  2:00 PM   Specimen: Urine, Clean Catch  Result Value Ref Range Status   Specimen Description   Final    URINE, CLEAN CATCH Performed at [facility], [place] [address] [place], [place] [address]    Special Requests   Final    NONE Performed at [facility], [place] [address], [place], [place] [address]    Culture   Final    NO GROWTH Performed at [facility], [place] [address], [place], [place] [address]    Report Status 07/07/2022 FINAL  Final    Labs: Results for  orders placed or performed during the hospital encounter of 07/05/22 (from the past 48 hour(s))  Glucose, capillary     Status: Abnormal   Collection Time: 07/06/22  5:13 PM  Result Value Ref Range   Glucose-Capillary 132 (H) 70 - 99 mg/dL    Comment: Glucose reference range applies only to samples taken after fasting for at least 8 hours.  Glucose, capillary     Status: Abnormal   Collection Time: 07/06/22  8:55 PM  Result Value Ref Range   Glucose-Capillary 180 (H) 70 - 99 mg/dL    Comment: Glucose reference range applies only to samples taken after fasting for at least 8 hours.  Prealbumin     Status: Abnormal   Collection Time: 07/07/22 12:32 AM  Result Value Ref Range   Prealbumin 16 (L) 18 - 38 mg/dL    Comment: Performed at [facility], [place] [address] [place], [place] [address]  Basic metabolic panel     Status: Abnormal   Collection Time: 07/07/22 12:33 AM  Result Value Ref Range   Sodium 139 135 - 145 mmol/L   Potassium 4.3 3.5 - 5.1 mmol/L   Chloride 109 98 - 111 mmol/L   CO2 24 22 - 32 mmol/L   Glucose, Bld 141 (H) 70 - 99 mg/dL    Comment: Glucose reference range applies only to samples taken after fasting for at least 8 hours.   BUN 25 (H) 8 - 23 mg/dL    Creatinine, Ser 1.50 (H) 0.61 - 1.24 mg/dL   Calcium 8.1 (L) 8.9 - 10.3 mg/dL   GFR, Estimated 46 (L) >60 mL/min    Comment: (NOTE) Calculated using the CKD-EPI Creatinine Equation (2021)    Anion gap 6 5 - 15    Comment: Performed at [facility], [place] [address] [place] [address] [place], [place] [address]  CBC     Status: Abnormal   Collection Time: 07/07/22 12:33 AM  Result Value Ref Range   WBC 10.4 4.0 - 10.5 K/uL   RBC 3.33 (L) 4.22 - 5.81 MIL/uL   Hemoglobin 10.9 (L) 13.0 - 17.0 g/dL   HCT 33.9 (L) 39.0 - 52.0 %   MCV 101.8 (H) 80.0 - 100.0 fL   MCH 32.7 26.0 - 34.0 pg   MCHC 32.2 30.0 - 36.0 g/dL   RDW 14.0 11.5 - 15.5 %   Platelets 107 (L) 150 - 400 K/uL   nRBC 0.0 0.0 - 0.2 %    Comment: Performed at [facility], [place] [address], [place], [place] [address]  Magnesium     Status: None   Collection Time: 07/07/22 12:33 AM  Result Value Ref Range   Magnesium 2.1 1.7 - 2.4 mg/dL    Comment: Performed at [facility], [place] [address], [place], [place] [address]  Phosphorus     Status: None   Collection Time: 07/07/22 12:33 AM  Result Value Ref Range   Phosphorus 2.5 2.5 - 4.6 mg/dL    Comment: Performed at [facility], [place] [address], [place], [place] [address]  Glucose, capillary     Status: Abnormal   Collection Time: 07/07/22  7:20 AM  Result Value Ref Range   Glucose-Capillary 126 (H) 70 - 99 mg/dL    Comment: Glucose reference range applies only to samples taken after fasting for at least 8 hours.  Glucose, capillary     Status: Abnormal   Collection Time: 07/07/22 11:31 AM  Result Value  Ref Range   Glucose-Capillary 155 (H) 70 - 99 mg/dL    Comment: Glucose reference range applies only to samples taken after fasting for at least 8 hours.  Glucose, capillary     Status: Abnormal   Collection Time: 07/07/22  4:53 PM  Result Value Ref Range   Glucose-Capillary 152 (H) 70 - 99 mg/dL    Comment: Glucose  reference range applies only to samples taken after fasting for at least 8 hours.  Glucose, capillary     Status: Abnormal   Collection Time: 07/07/22 10:40 PM  Result Value Ref Range   Glucose-Capillary 150 (H) 70 - 99 mg/dL    Comment: Glucose reference range applies only to samples taken after fasting for at least 8 hours.  Basic metabolic panel     Status: Abnormal   Collection Time: 07/08/22  4:53 AM  Result Value Ref Range   Sodium 142 135 - 145 mmol/L   Potassium 4.1 3.5 - 5.1 mmol/L   Chloride 110 98 - 111 mmol/L   CO2 25 22 - 32 mmol/L   Glucose, Bld 130 (H) 70 - 99 mg/dL    Comment: Glucose reference range applies only to samples taken after fasting for at least 8 hours.   BUN 19 8 - 23 mg/dL   Creatinine, Ser 1.20 0.61 - 1.24 mg/dL   Calcium 8.7 (L) 8.9 - 10.3 mg/dL   GFR, Estimated >60 >60 mL/min    Comment: (NOTE) Calculated using the CKD-EPI Creatinine Equation (2021)    Anion gap 7 5 - 15    Comment: Performed at [facility], [place] [address], [place], [place] [address]  Hemoglobin A1c     Status: None   Collection Time: 07/08/22  4:53 AM  Result Value Ref Range   Hgb A1c MFr Bld 5.3 4.8 - 5.6 %    Comment: (NOTE) Pre diabetes:          5.7%-6.4%  Diabetes:              >6.4%  Glycemic control for   <7.0% adults with diabetes    Mean Plasma Glucose 105.41 mg/dL    Comment: Performed at [place] [address], [place], [place] [address]  Magnesium     Status: None   Collection Time: 07/08/22  4:53 AM  Result Value Ref Range   Magnesium 2.0 1.7 - 2.4 mg/dL    Comment: Performed at [facility], [place] [address], [place], [place] [address]  Phosphorus     Status: Abnormal   Collection Time: 07/08/22  4:53 AM  Result Value Ref Range   Phosphorus 2.4 (L) 2.5 - 4.6 mg/dL    Comment: Performed at [facility], [place] [address], [place], [place] [address]  Hepatic function panel     Status: Abnormal    Collection Time: 07/08/22  4:53 AM  Result Value Ref Range   Total Protein 5.6 (L) 6.5 - 8.1 g/dL   Albumin 3.0 (L) 3.5 - 5.0 g/dL   AST 39 15 - 41 U/L   ALT 180 (H) 0 - 44 U/L   Alkaline Phosphatase 51 38 - 126 U/L   Total Bilirubin 0.6 0.3 - 1.2 mg/dL   Bilirubin, Direct 0.2 0.0 - 0.2 mg/dL   Indirect Bilirubin 0.4 0.3 - 0.9 mg/dL    Comment: Performed at [facility], [place] [address] [place], [place] [address]  Glucose, capillary     Status:  Abnormal   Collection Time: 07/08/22  7:32 AM  Result Value Ref Range   Glucose-Capillary 138 (H) 70 - 99 mg/dL    Comment: Glucose reference range applies only to samples taken after fasting for at least 8 hours.  CBC with Differential/Platelet     Status: Abnormal   Collection Time: 07/08/22 10:24 AM  Result Value Ref Range   WBC 10.1 4.0 - 10.5 K/uL   RBC 3.50 (L) 4.22 - 5.81 MIL/uL   Hemoglobin 11.4 (L) 13.0 - 17.0 g/dL   HCT 33.3 (L) 39.0 - 52.0 %   MCV 95.1 80.0 - 100.0 fL   MCH 32.6 26.0 - 34.0 pg   MCHC 34.2 30.0 - 36.0 g/dL   RDW 13.3 11.5 - 15.5 %   Platelets 126 (L) 150 - 400 K/uL   nRBC 0.0 0.0 - 0.2 %   Neutrophils Relative % 87 %   Neutro Abs 8.9 (H) 1.7 - 7.7 K/uL   Lymphocytes Relative 6 %   Lymphs Abs 0.6 (L) 0.7 - 4.0 K/uL   Monocytes Relative 5 %   Monocytes Absolute 0.5 0.1 - 1.0 K/uL   Eosinophils Relative 1 %   Eosinophils Absolute 0.1 0.0 - 0.5 K/uL   Basophils Relative 0 %   Basophils Absolute 0.0 0.0 - 0.1 K/uL   Immature Granulocytes 1 %   Abs Immature Granulocytes 0.07 0.00 - 0.07 K/uL    Comment: Performed at [facility], [place] [address] [place], [place] [address]  Glucose, capillary     Status: Abnormal   Collection Time: 07/08/22 11:17 AM  Result Value Ref Range   Glucose-Capillary 140 (H) 70 - 99 mg/dL    Comment: Glucose reference range applies only to samples taken after fasting for at least 8 hours.    Imaging / Studies: NM HEPATOBILIARY LEAK  (POST-SURGICAL)  Result Date: 07/07/2022 CLINICAL DATA:  Subtotal cholecystectomy performed. Postop day 1. Surgical drain in place. Concern for biliary leak EXAM: NUCLEAR MEDICINE HEPATOBILIARY IMAGING TECHNIQUE: Sequential images of the abdomen were obtained out to 60 minutes following intravenous administration of radiopharmaceutical. RADIOPHARMACEUTICALS:  5.3 mCi Tc-76m Choletec IV COMPARISON:  Ultrasound 07/05/2022 FINDINGS: Prompt clearance of radiotracer from blood pool and homogeneous uptake in liver. Counts are evident in the common bile duct and small bowel by 20 minutes. At 15 minutes, counts begin to collect along the inferior margin of the RIGHT hepatic lobe. These counts spill into the peritoneal space along the RIGHT pericolic gutter consistent with biliary leak. IMPRESSION: Biliary leak with biliary excreted radiotracer collecting in the gallbladder fossa and spilling into the RIGHT pericolic gutter. Patent common bile duct. These results will be called to the ordering clinician or representative by the Radiologist Assistant, and communication documented in the PACS or [employer] Electronically Signed   By: SSuzy BouchardM.D.   On: 07/07/2022 16:56    Medications / Allergies: per chart  Antibiotics: Anti-infectives (From admission, onward)    Start     Dose/Rate Route Frequency Ordered Stop   07/06/22 1245  ceFEPIme (MAXIPIME) 2 g in sodium chloride 0.9 % 100 mL IVPB        2 g 200 mL/hr over 30 Minutes Intravenous Every 12 hours 07/06/22 1158     07/05/22 1215  ceFEPIme (MAXIPIME) 2 g in sodium chloride 0.9 % 100 mL IVPB        2 g 200 mL/hr over 30 Minutes Intravenous  Once 07/05/22 1201 07/05/22 1313  07/05/22 1215  metroNIDAZOLE (FLAGYL) IVPB 500 mg        500 mg 100 mL/hr over 60 Minutes Intravenous Every 12 hours 07/05/22 1203           Note: Portions of this report may have been transcribed using voice recognition software. Every effort was made to ensure  accuracy; however, inadvertent computerized transcription errors may be present.   Any transcriptional errors that result from this process are unintentional.    Adin Hector, MD, FACS, MASCRS Esophageal, Gastrointestinal & Colorectal Surgery Robotic and Minimally Invasive Surgery  [facility]. [address] [place], [place] [address] [phone] Fax [phone] Main  CONTACT INFORMATION:  Weekday (9AM-5PM): Call [facility] main office at [phone]  Weeknight (5PM-9AM) or Weekend/Holiday: Check [email or web address] (password " [email or web address]") for General Surgery [facility] coverage  (Please, do not use SecureChat as it is not reliable communication to reach operating surgeons for immediate patient care)      07/08/2022  2:13 PM

## 2022-07-08 NOTE — Progress Notes (Signed)
Mobility Specialist - Progress Note    07/08/22 1044  Mobility  HOB Elevated/Bed Position Self regulated  Activity Ambulated with assistance in hallway  Range of Motion/Exercises Active  Level of Assistance Contact guard assist, steadying assist  Assistive Device Front wheel walker  Distance Ambulated (ft) 150 ft  Activity Response Tolerated well  Transport method Ambulatory  $Mobility charge 1 Mobility   Pt received in bed and agreeable to mobility.  Pt a little shaky during ambulation. Pt to bed after session with all needs met.    [facility]

## 2022-07-08 NOTE — Consult Note (Addendum)
Consultation Note   Referring Provider: [facility] PCP: Francisco [name], [place] Primary Gastroenterologist: Francisco Shorts, MD  Reason for consultation: Bile duct leak  Hospital Day: 4  Assessment / Plan   # 81 yo male with bile duct lead following subtotal cholecystectomy on 8/20 done for symptomatic cholelithiasis  Patient will need an ERCP with stent placement. The benefits and risks of the procedure not limited to cardiopulmonary complications of sedation, bleeding, infection, perforation, and pancreatitis were discussed with the patient who agrees to proceed.  Timing of the procedure to be determined. It could possibly be done late this afternoon. I stopped SQ Lovenox ( hasn't received today's dose) Currently getting Maxipime and Flagyl  # Chronic [place] anemia, likely secondary to CKD  # Thrombocytopenia, ? Etiology. Platelets stable at 107. No reported cirrhosis or splenomegaly on CT scan in June 2023  # Constipation, no BM in several days.  Continue colace and fiber Dulcolax suppository now  # See PMH for additional medical problems   HPI   Francisco [name] is a 81 y.o. male with a past medical history significant for HLD, HTN, PVD, CKD 3a, GERD, adenomatous colon polys.   See PMH for any additional medical problems.  Mr. Francisco [name] was last seen in 2011 by Dr. Henrene [name] for history of colon polyps.   Patient presented to ED 8/20 with RUQ / epigastric pain.  LFTs were normal. WBC normal. RUQ showed cholelithiasis without cholecystitis. EKG without ischemic changes. Developed rigors, started on antibiotics. Of note he was admitted in June with gram negative bacteremia ( E.Coli). Patient admitted by General Surgery and underwent a laparoscopic subtotal cholecystectomy with drain placement on 8/20. He subsequently developed a bile duct leak.  Mr Francisco [name] if having persistent abdominal discomfort. He no other GI complaints. He hasn't had a BM in  several days.    Previous GI Evaluation      Recent Labs and Imaging NM HEPATOBILIARY LEAK (POST-SURGICAL)  Result Date: 07/07/2022 CLINICAL DATA:  Subtotal cholecystectomy performed. Postop day 1. Surgical drain in place. Concern for biliary leak EXAM: NUCLEAR MEDICINE HEPATOBILIARY IMAGING TECHNIQUE: Sequential images of the abdomen were obtained out to 60 minutes following intravenous administration of radiopharmaceutical. RADIOPHARMACEUTICALS:  5.3 mCi Tc-61m Choletec IV COMPARISON:  Ultrasound 07/05/2022 FINDINGS: Prompt clearance of radiotracer from blood pool and homogeneous uptake in liver. Counts are evident in the common bile duct and small bowel by 20 minutes. At 15 minutes, counts begin to collect along the inferior margin of the RIGHT hepatic lobe. These counts spill into the peritoneal space along the RIGHT pericolic gutter consistent with biliary leak. IMPRESSION: Biliary leak with biliary excreted radiotracer collecting in the gallbladder fossa and spilling into the RIGHT pericolic gutter. Patent common bile duct. These results will be called to the ordering clinician or representative by the Radiologist Assistant, and communication documented in the PACS or [employer] Electronically Signed   By: SSuzy BouchardM.D.   On: 07/07/2022 16:56   [place]domen Limited RUQ (LIVER/GB)  Result Date: 07/05/2022 CLINICAL DATA:  Right upper quadrant pain EXAM: ULTRASOUND ABDOMEN LIMITED RIGHT UPPER QUADRANT COMPARISON:  None Available. FINDINGS: Gallbladder: Cholelithiasis with the largest gallstone measuring 2.4 cm. No pericholecystic fluid. Top-normal gallbladder wall thickness.  Negative sonographic Murphy sign. Common bile duct: Diameter: 5 mm Liver: No focal lesion identified. Within normal limits in parenchymal echogenicity. Portal vein is patent on color Doppler imaging with normal direction of blood flow towards the liver. Other: None. IMPRESSION: 1. Cholelithiasis without sonographic  evidence of acute cholecystitis. Electronically Signed   By: Kathreen Devoid M.D.   On: 07/05/2022 11:24   DG Chest 2 View  Result Date: 07/05/2022 CLINICAL DATA:  Pleuritic chest pain. EXAM: CHEST - 2 VIEW COMPARISON:  05/05/2022 FINDINGS: The heart size and mediastinal contours are unremarkable. There is no pleural effusion or edema. No airspace opacities identified. Signs of previous ACDF within the cervical spine. IMPRESSION: No active cardiopulmonary abnormalities. Electronically Signed   By: Kerby Moors M.D.   On: 07/05/2022 11:12    Labs:  Recent Labs    07/05/22 1049 07/06/22 0329 07/07/22 0033  WBC 9.3 11.7* 10.4  HGB 11.5* 10.9* 10.9*  HCT 34.2* 31.8* 33.9*  PLT 135* 116* 107*   Recent Labs    07/06/22 0329 07/07/22 0033 07/08/22 0453  NA 141 139 142  K 4.4 4.3 4.1  CL 108 109 110  CO2 '25 24 25  '$ GLUCOSE 198* 141* 130*  BUN 30* 25* 19  CREATININE 1.72* 1.50* 1.20  CALCIUM 7.9* 8.1* 8.7*   Recent Labs    07/05/22 1049  PROT 5.1*  ALBUMIN 3.2*  AST 15  ALT 12  ALKPHOS 30*  BILITOT 0.6   No results for input(s): "HEPBSAG", "HCVAB", "HEPAIGM", "HEPBIGM" in the last 72 hours. Recent Labs    07/05/22 1232  LABPROT 14.7  INR 1.2    Past Medical History:  Diagnosis Date   Anxiety    GERD (gastroesophageal reflux disease)    Hypertension     Past Surgical History:  Procedure Laterality Date   AMPUTATION Left 08/05/2016   Procedure: REVISION AMPUTATION LEFT INDEX FINGER REPAIR LACERATION LEFT THUMB;  Surgeon: [name], MD;  Location: [facility];  Service: Orthopedics;  Laterality: Left;   CHOLECYSTECTOMY N/A 07/05/2022   Procedure: LAPAROSCOPIC CHOLECYSTECTOMY;  Surgeon: Dwan Bolt, MD;  Location: [facility];  Service: General;  Laterality: N/A;   SPINE SURGERY      Family History  Problem Relation Age of Onset   Healthy Mother    Transient ischemic attack Father    Rheum arthritis Daughter    Amblyopia Neg Hx    Blindness Neg Hx    Cataracts Neg  Hx    Glaucoma Neg Hx    Macular degeneration Neg Hx    Retinal detachment Neg Hx    Strabismus Neg Hx    Retinitis pigmentosa Neg Hx     Prior to Admission medications   Medication Sig Start Date End Date Taking? Authorizing Provider  ALPRAZolam (XANAX) 1 MG tablet TAKE 1 TABLET BY MOUTH TWICE  DAILY AS NEEDED FOR ANXIETY OR  SLEEP Patient taking differently: Take 1 mg by mouth 2 (two) times daily. 06/29/22  Yes Hassell Done, Mary-Margaret, FNP  aspirin 81 MG tablet Take 81 mg by mouth daily.   Yes [provider]  escitalopram (LEXAPRO) 10 MG tablet TAKE 2 TABLETS BY MOUTH DAILY Patient taking differently: Take 10 mg by mouth daily. 06/05/22  Yes Martin, Mary-Margaret, FNP  fish oil-omega-3 fatty acids 1000 MG capsule Take 2 g by mouth daily.   Yes [provider]  furosemide (LASIX) 20 MG tablet TAKE 1 TABLET BY MOUTH DAILY 06/15/22  Yes Hassell Done Mary-Margaret, FNP  meclizine (ANTIVERT)  25 MG tablet TAKE 1 TABLET BY MOUTH 3  TIMES DAILY AS NEEDED FOR  DIZZINESS Patient taking differently: Take 25 mg by mouth 2 (two) times daily. 03/02/22  Yes Hassell Done, Mary-Margaret, FNP  meloxicam (MOBIC) 7.5 MG tablet TAKE 1 TABLET BY MOUTH DAILY 06/25/22  Yes Hassell Done, Mary-Margaret, FNP  metoprolol succinate (TOPROL-XL) 25 MG 24 hr tablet Take 1 tablet (25 mg total) by mouth 2 (two) times daily. 05/04/22  Yes Park Liter, MD  pantoprazole (PROTONIX) 20 MG tablet TAKE 1 TABLET BY MOUTH DAILY Patient taking differently: Take 20 mg by mouth daily. 05/01/22  Yes Martin, Mary-Margaret, FNP  simvastatin (ZOCOR) 80 MG tablet Take 1 tablet (80 mg total) by mouth daily. 12/16/21  Yes Park Liter, MD  tamsulosin (FLOMAX) 0.4 MG CAPS capsule TAKE 1 CAPSULE BY MOUTH DAILY Patient taking differently: Take 0.4 mg by mouth daily. 03/20/22  Yes Hassell Done, Mary-Margaret, FNP  acetaminophen (TYLENOL) 325 MG tablet Take 650 mg by mouth every 6 (six) hours as needed for mild pain or fever.    [provider]    Current Facility-Administered Medications  Medication Dose Route Frequency Provider Last Rate Last Admin   acetaminophen (TYLENOL) tablet 1,000 mg  1,000 mg Oral TID Michael Boston, MD   1,000 mg at 07/07/22 2128   alum & mag hydroxide-simeth (MAALOX/MYLANTA) 200-200-20 MG/5ML suspension 30 mL  30 mL Oral Q6H PRN Michael Boston, MD       aspirin EC tablet 81 mg  81 mg Oral Daily Michael Boston, MD   81 mg at 07/07/22 0917   atorvastatin (LIPITOR) tablet 40 mg  40 mg Oral Daily Michael Boston, MD   40 mg at 07/07/22 0258   bisacodyl (DULCOLAX) suppository 10 mg  10 mg Rectal Q12H PRN Michael Boston, MD       ceFEPIme (MAXIPIME) 2 g in sodium chloride 0.9 % 100 mL IVPB  2 g Intravenous Gorden Harms, MD 200 mL/hr at 07/08/22 0907 2 g at 07/08/22 0907   diphenhydrAMINE (BENADRYL) injection 12.5-25 mg  12.5-25 mg Intravenous Q6H PRN Michael Boston, MD       docusate sodium (COLACE) capsule 100 mg  100 mg Oral BID Michael Boston, MD   100 mg at 07/07/22 2128   enalaprilat (VASOTEC) injection 0.625-1.25 mg  0.625-1.25 mg Intravenous Q6H PRN Michael Boston, MD       enoxaparin (LOVENOX) injection 40 mg  40 mg Subcutaneous Q24H Michael Boston, MD   40 mg at 07/07/22 0741   escitalopram (LEXAPRO) tablet 20 mg  20 mg Oral Daily Michael Boston, MD   20 mg at 07/07/22 5277   HYDROmorphone (DILAUDID) injection 0.5 mg  0.5 mg Intravenous Q2H PRN Reubin Milan, MD       insulin aspart (novoLOG) injection 0-15 Units  0-15 Units Subcutaneous TID Daun Peacock, MD   3 Units at 07/07/22 1706   insulin aspart (novoLOG) injection 0-5 Units  0-5 Units Subcutaneous QHS Michael Boston, MD       lactated ringers bolus 1,000 mL  1,000 mL Intravenous Q8H PRN Michael Boston, MD       lactated ringers infusion   Intravenous Continuous Michael Boston, MD 75 mL/hr at 07/07/22 2125 New Bag at 07/07/22 2125   lip balm (CARMEX) ointment   Topical BID Michael Boston, MD   1 Application at 82/42/35 2129   magic  mouthwash  15 mL Oral QID PRN Michael Boston, MD  meclizine (ANTIVERT) tablet 25 mg  25 mg Oral TID PRN Michael Boston, MD       menthol-cetylpyridinium (CEPACOL) lozenge 3 mg  1 lozenge Oral PRN Michael Boston, MD   3 mg at 07/08/22 0558   methocarbamol (ROBAXIN) 1,000 mg in dextrose 5 % 100 mL IVPB  1,000 mg Intravenous Q6H PRN Michael Boston, MD       methocarbamol (ROBAXIN) tablet 1,000 mg  1,000 mg Oral Q6H PRN Michael Boston, MD   1,000 mg at 07/06/22 2129   metoprolol succinate (TOPROL-XL) 24 hr tablet 25 mg  25 mg Oral BID Michael Boston, MD   25 mg at 07/07/22 2128   metoprolol tartrate (LOPRESSOR) injection 5 mg  5 mg Intravenous Q6H PRN Michael Boston, MD       metroNIDAZOLE (FLAGYL) IVPB 500 mg  500 mg Intravenous Gorden Harms, MD 100 mL/hr at 07/08/22 0050 500 mg at 07/08/22 0050   ondansetron (ZOFRAN-ODT) disintegrating tablet 4 mg  4 mg Oral Q6H PRN Michael Boston, MD   4 mg at 07/07/22 1846   Or   ondansetron (ZOFRAN) injection 4 mg  4 mg Intravenous Q6H PRN Michael Boston, MD   4 mg at 07/08/22 3086   oxyCODONE (Oxy IR/ROXICODONE) immediate release tablet 5-10 mg  5-10 mg Oral Q4H PRN Michael Boston, MD   10 mg at 07/07/22 1152   pantoprazole (PROTONIX) EC tablet 20 mg  20 mg Oral Daily Michael Boston, MD   20 mg at 07/07/22 0916   phenol (CHLORASEPTIC) mouth spray 2 spray  2 spray Mouth/Throat PRN Michael Boston, MD       polycarbophil (FIBERCON) tablet 625 mg  625 mg Oral BID Michael Boston, MD   625 mg at 07/07/22 2128   polyethylene glycol (MIRALAX / GLYCOLAX) packet 17 g  17 g Oral Daily PRN Michael Boston, MD       prochlorperazine (COMPAZINE) injection 5-10 mg  5-10 mg Intravenous Q4H PRN Michael Boston, MD   10 mg at 07/08/22 5784   simethicone (MYLICON) 40 ON/6.2XB suspension 80 mg  80 mg Oral QID PRN Michael Boston, MD       tamsulosin [facility]) capsule 0.4 mg  0.4 mg Oral Daily Michael Boston, MD   0.4 mg at 07/07/22 2841   traMADol (ULTRAM) tablet 50 mg  50 mg Oral Q8H PRN  Michael Boston, MD   50 mg at 07/08/22 0409    Allergies as of 07/05/2022 - Review Complete 07/05/2022  Allergen Reaction Noted   Crestor [rosuvastatin]  04/12/2013   Lipitor [atorvastatin]  04/12/2013   Penicillins  07/15/2010   Symbicort [budesonide-formoterol fumarate]  03/25/2015    Social History   Socioeconomic History   Marital status: Widowed    Spouse name: Not on file   Number of children: 2   Years of education: Not on file   Highest education level: Not on file  Occupational History   Occupation: Retired    Fish farm manager: [employer]  Tobacco Use   Smoking status: Former    Types: Cigarettes    Quit date: 02/23/1972    Years since quitting: 50.4   Smokeless tobacco: Never  Vaping Use   Vaping Use: Never used  Substance and Sexual Activity   Alcohol use: Yes    Alcohol/week: 1.0 standard drink of alcohol    Types: 1 Standard drinks or equivalent per week    Comment: occasional beer   Drug use: No   Sexual activity: Not Currently  Other  Topics Concern   Not on file  Social History Narrative   2 daughters nearby   Social Determinants of Health   Financial Resource Strain: Low Risk  (08/08/2021)   Overall Financial Resource Strain (CARDIA)    Difficulty of Paying Living Expenses: Not hard at all  Food Insecurity: No Food Insecurity (08/08/2021)   Hunger Vital Sign    Worried About Running Out of Food in the Last Year: Never true    Ran Out of Food in the Last Year: Never true  Transportation Needs: No Transportation Needs (08/08/2021)   PRAPARE - Hydrologist (Medical): No    Lack of Transportation (Non-Medical): No  Physical Activity: Inactive (08/08/2021)   Exercise Vital Sign    Days of Exercise per Week: 0 days    Minutes of Exercise per Session: 0 min  Stress: No Stress Concern Present (08/08/2021)   [place]    Feeling of Stress : Not at all  Social  Connections: Socially Isolated (08/08/2021)   Social Connection and Isolation Panel [NHANES]    Frequency of Communication with Friends and Family: More than three times a week    Frequency of Social Gatherings with Friends and Family: More than three times a week    Attends Religious Services: Never    Marine scientist or Organizations: No    Attends Archivist Meetings: Never    Marital Status: Widowed  Intimate Partner Violence: Not At Risk (08/08/2021)   Humiliation, Afraid, Rape, and Kick questionnaire    Fear of Current or Ex-Partner: No    Emotionally Abused: No    Physically Abused: No    Sexually Abused: No    Review of Systems: All systems reviewed and negative except where noted in HPI.  Physical Exam: Vital signs in last 24 hours: Temp:  [98 F (36.7 C)-98.9 F (37.2 C)] 98.9 F (37.2 C) (08/23 0416) Pulse Rate:  [56-68] 66 (08/23 0416) Resp:  [18-20] 20 (08/23 0416) BP: (136-167)/(63-95) 150/90 (08/23 0425) SpO2:  [95 %-97 %] 95 % (08/23 0416) Weight:  [95.7 kg] 95.7 kg (08/23 0413) Last BM Date : 07/04/22  General:  Alert male in NAD Psych:  Pleasant, cooperative. Normal mood and affect Eyes: Pupils equal, no icterus. Conjunctive pink Ears:  Normal auditory acuity Nose: No deformity, discharge or lesions Neck:  Supple, no masses felt Lungs:  Clear to auscultation.  Heart:  Regular rate, regular rhythm. No lower extremity edema Abdomen:  Soft, nondistended, mild RUQ tenderness, active bowel sounds.  Perc drain with scan bilious drainage  Rectal :  Deferred Msk: Symmetrical without gross deformities.  Neurologic:  Alert, oriented, grossly normal neurologically Skin:  Intact without significant lesions.    Intake/Output from previous day: 08/22 0701 - 08/23 0700 In: 2950.5 [P.O.:300; I.V.:2034.8; IV Piggyback:615.7] Out: 875 [Urine:750; Drains:125] Intake/Output this shift:  Total I/O In: 0  Out: 240 [Urine:200;  Drains:40]    Principal Problem:   Acute cholecystitis with chronic cholecystitis Active Problems:   Essential hypertension   GAD (generalized anxiety disorder)   GERD without esophagitis   Mixed hyperlipidemia   Thrombocytopenia (HCC)   Chronic kidney disease, stage 3a (HCC)   RUQ abdominal pain   Acute calculous cholecystitis   Elevated serum creatinine   Normocytic anemia    Tye Savoy, NP-C @  07/08/2022, 9:18 AM

## 2022-07-09 ENCOUNTER — Inpatient Hospital Stay (HOSPITAL_COMMUNITY): Payer: Medicare Other | Admitting: Anesthesiology

## 2022-07-09 ENCOUNTER — Encounter (HOSPITAL_COMMUNITY): Admission: EM | Disposition: A | Discharge status: Home Health | Payer: Self-pay | Source: Home / Self Care

## 2022-07-09 ENCOUNTER — Encounter (HOSPITAL_COMMUNITY): Payer: Self-pay

## 2022-07-09 ENCOUNTER — Inpatient Hospital Stay (HOSPITAL_COMMUNITY): Payer: Medicare Other

## 2022-07-09 DIAGNOSIS — K8000 Calculus of gallbladder with acute cholecystitis without obstruction: Secondary | ICD-10-CM | POA: Diagnosis not present

## 2022-07-09 DIAGNOSIS — Z87891 Personal history of nicotine dependence: Secondary | ICD-10-CM

## 2022-07-09 DIAGNOSIS — D649 Anemia, unspecified: Secondary | ICD-10-CM

## 2022-07-09 DIAGNOSIS — N1831 Chronic kidney disease, stage 3a: Secondary | ICD-10-CM | POA: Diagnosis not present

## 2022-07-09 DIAGNOSIS — K9189 Other postprocedural complications and disorders of digestive system: Secondary | ICD-10-CM

## 2022-07-09 DIAGNOSIS — K812 Acute cholecystitis with chronic cholecystitis: Secondary | ICD-10-CM | POA: Diagnosis not present

## 2022-07-09 DIAGNOSIS — I10 Essential (primary) hypertension: Secondary | ICD-10-CM

## 2022-07-09 DIAGNOSIS — R7989 Other specified abnormal findings of blood chemistry: Secondary | ICD-10-CM | POA: Diagnosis not present

## 2022-07-09 DIAGNOSIS — K838 Other specified diseases of biliary tract: Secondary | ICD-10-CM

## 2022-07-09 DIAGNOSIS — K5900 Constipation, unspecified: Secondary | ICD-10-CM

## 2022-07-09 DIAGNOSIS — K839 Disease of biliary tract, unspecified: Secondary | ICD-10-CM

## 2022-07-09 HISTORY — PX: ERCP: SHX5425

## 2022-07-09 HISTORY — PX: BILIARY STENT PLACEMENT: SHX5538

## 2022-07-09 HISTORY — PX: SPHINCTEROTOMY: SHX5544

## 2022-07-09 LAB — CBC WITH DIFFERENTIAL/PLATELET
Abs Immature Granulocytes: 0.04 10*3/uL (ref 0.00–0.07)
Basophils Absolute: 0 10*3/uL (ref 0.0–0.1)
Basophils Relative: 0 %
Eosinophils Absolute: 0.2 10*3/uL (ref 0.0–0.5)
Eosinophils Relative: 2 %
HCT: 33.8 % — ABNORMAL LOW (ref 39.0–52.0)
Hemoglobin: 11.7 g/dL — ABNORMAL LOW (ref 13.0–17.0)
Immature Granulocytes: 0 %
Lymphocytes Relative: 12 %
Lymphs Abs: 1.1 10*3/uL (ref 0.7–4.0)
MCH: 32.6 pg (ref 26.0–34.0)
MCHC: 34.6 g/dL (ref 30.0–36.0)
MCV: 94.2 fL (ref 80.0–100.0)
Monocytes Absolute: 0.7 10*3/uL (ref 0.1–1.0)
Monocytes Relative: 8 %
Neutro Abs: 6.9 10*3/uL (ref 1.7–7.7)
Neutrophils Relative %: 78 %
Platelets: 160 10*3/uL (ref 150–400)
RBC: 3.59 MIL/uL — ABNORMAL LOW (ref 4.22–5.81)
RDW: 13.2 % (ref 11.5–15.5)
WBC: 9 10*3/uL (ref 4.0–10.5)
nRBC: 0 % (ref 0.0–0.2)

## 2022-07-09 LAB — IRON AND TIBC
Iron: 62 ug/dL (ref 45–182)
Saturation Ratios: 30 % (ref 17.9–39.5)
TIBC: 209 ug/dL — ABNORMAL LOW (ref 250–450)
UIBC: 147 ug/dL

## 2022-07-09 LAB — VITAMIN B12: Vitamin B-12: 1111 pg/mL — ABNORMAL HIGH (ref 180–914)

## 2022-07-09 LAB — GLUCOSE, CAPILLARY
Glucose-Capillary: 111 mg/dL — ABNORMAL HIGH (ref 70–99)
Glucose-Capillary: 112 mg/dL — ABNORMAL HIGH (ref 70–99)
Glucose-Capillary: 119 mg/dL — ABNORMAL HIGH (ref 70–99)
Glucose-Capillary: 125 mg/dL — ABNORMAL HIGH (ref 70–99)
Glucose-Capillary: 125 mg/dL — ABNORMAL HIGH (ref 70–99)
Glucose-Capillary: 141 mg/dL — ABNORMAL HIGH (ref 70–99)

## 2022-07-09 LAB — COMPREHENSIVE METABOLIC PANEL
ALT: 128 U/L — ABNORMAL HIGH (ref 0–44)
AST: 29 U/L (ref 15–41)
Albumin: 3.2 g/dL — ABNORMAL LOW (ref 3.5–5.0)
Alkaline Phosphatase: 50 U/L (ref 38–126)
Anion gap: 6 (ref 5–15)
BUN: 17 mg/dL (ref 8–23)
CO2: 26 mmol/L (ref 22–32)
Calcium: 8.7 mg/dL — ABNORMAL LOW (ref 8.9–10.3)
Chloride: 108 mmol/L (ref 98–111)
Creatinine, Ser: 1.4 mg/dL — ABNORMAL HIGH (ref 0.61–1.24)
GFR, Estimated: 50 mL/min — ABNORMAL LOW (ref >60)
Glucose, Bld: 123 mg/dL — ABNORMAL HIGH (ref 70–99)
Potassium: 4.1 mmol/L (ref 3.5–5.1)
Sodium: 140 mmol/L (ref 135–145)
Total Bilirubin: 0.8 mg/dL (ref 0.3–1.2)
Total Protein: 5.9 g/dL — ABNORMAL LOW (ref 6.5–8.1)

## 2022-07-09 LAB — PHOSPHORUS: Phosphorus: 2.3 mg/dL — ABNORMAL LOW (ref 2.5–4.6)

## 2022-07-09 LAB — CULTURE, BLOOD (ROUTINE X 2): Special Requests: ADEQUATE

## 2022-07-09 LAB — RETICULOCYTES
Immature Retic Fract: 14.3 % (ref 2.3–15.9)
RBC.: 3.59 MIL/uL — ABNORMAL LOW (ref 4.22–5.81)
Retic Count, Absolute: 56 10*3/uL (ref 19.0–186.0)
Retic Ct Pct: 1.6 % (ref 0.4–3.1)

## 2022-07-09 LAB — MAGNESIUM: Magnesium: 2 mg/dL (ref 1.7–2.4)

## 2022-07-09 LAB — FERRITIN: Ferritin: 257 ng/mL (ref 24–336)

## 2022-07-09 LAB — FOLATE: Folate: 8.7 ng/mL (ref >5.9)

## 2022-07-09 SURGERY — ERCP, WITH INTERVENTION IF INDICATED
Anesthesia: General

## 2022-07-09 MED ORDER — GLUCAGON HCL RDNA (DIAGNOSTIC) 1 MG IJ SOLR
INTRAMUSCULAR | Status: DC | PRN
  Administered 2022-07-09: .25 mg via INTRAVENOUS

## 2022-07-09 MED ORDER — LIDOCAINE 2% (20 MG/ML) 5 ML SYRINGE
INTRAMUSCULAR | Status: DC | PRN
  Administered 2022-07-09: 80 mg via INTRAVENOUS

## 2022-07-09 MED ORDER — DICLOFENAC SUPPOSITORY 100 MG
RECTAL | Status: AC
  Filled 2022-07-09: qty 1

## 2022-07-09 MED ORDER — PHENYLEPHRINE HCL-NACL 20-0.9 MG/250ML-% IV SOLN
INTRAVENOUS | Status: DC | PRN
  Administered 2022-07-09: 30 ug/min via INTRAVENOUS

## 2022-07-09 MED ORDER — SUGAMMADEX SODIUM 200 MG/2ML IV SOLN
INTRAVENOUS | Status: DC | PRN
  Administered 2022-07-09: 300 mg via INTRAVENOUS

## 2022-07-09 MED ORDER — HYDRALAZINE HCL 20 MG/ML IJ SOLN
INTRAMUSCULAR | Status: AC
  Filled 2022-07-09: qty 1

## 2022-07-09 MED ORDER — GLUCAGON HCL RDNA (DIAGNOSTIC) 1 MG IJ SOLR
INTRAMUSCULAR | Status: AC
  Filled 2022-07-09: qty 1

## 2022-07-09 MED ORDER — CIPROFLOXACIN IN D5W 400 MG/200ML IV SOLN
INTRAVENOUS | Status: AC
  Filled 2022-07-09: qty 200

## 2022-07-09 MED ORDER — K PHOS MONO-SOD PHOS DI & MONO 155-852-130 MG PO TABS
500.0000 mg | ORAL_TABLET | Freq: Once | ORAL | Status: AC
  Administered 2022-07-09: 500 mg via ORAL
  Filled 2022-07-09: qty 2

## 2022-07-09 MED ORDER — HYDRALAZINE HCL 20 MG/ML IJ SOLN
10.0000 mg | Freq: Once | INTRAMUSCULAR | Status: AC
  Administered 2022-07-09: 10 mg via INTRAVENOUS

## 2022-07-09 MED ORDER — DICLOFENAC SUPPOSITORY 100 MG
RECTAL | Status: DC | PRN
  Administered 2022-07-09: 100 mg via RECTAL

## 2022-07-09 MED ORDER — SODIUM CHLORIDE 0.9 % IV SOLN
INTRAVENOUS | Status: DC | PRN
  Administered 2022-07-09: 10 mL

## 2022-07-09 MED ORDER — ROCURONIUM BROMIDE 10 MG/ML (PF) SYRINGE
PREFILLED_SYRINGE | INTRAVENOUS | Status: DC | PRN
  Administered 2022-07-09: 70 mg via INTRAVENOUS

## 2022-07-09 MED ORDER — PROPOFOL 10 MG/ML IV BOLUS
INTRAVENOUS | Status: DC | PRN
  Administered 2022-07-09: 120 mg via INTRAVENOUS

## 2022-07-09 NOTE — Progress Notes (Signed)
Pt returned from ERCP, VSS. Made Pt comfortable in bed.

## 2022-07-09 NOTE — Transfer of Care (Signed)
Immediate Anesthesia Transfer of Care Note  Patient: Francisco [name]  Procedure(s) Performed: ENDOSCOPIC RETROGRADE CHOLANGIOPANCREATOGRAPHY (ERCP) SPHINCTEROTOMY BILIARY STENT PLACEMENT  Patient Location: PACU  Anesthesia Type:General  Level of Consciousness: awake  Airway & Oxygen Therapy: Patient Spontanous Breathing  Post-op Assessment: Report given to RN and Post -op Vital signs reviewed and stable  Post vital signs: Reviewed and stable  Last Vitals:  Vitals Value Taken Time  BP 146/60 07/09/22 1625  Temp    Pulse 80 07/09/22 1626  Resp 14 07/09/22 1626  SpO2 97 % 07/09/22 1626  Vitals shown include unvalidated device data.  Last Pain:  Vitals:   07/09/22 1418  TempSrc: Temporal  PainSc: 0-No pain      Patients Stated Pain Goal: 2 (27/51/70 0174)  Complications: No notable events documented.

## 2022-07-09 NOTE — Progress Notes (Signed)
Francisco [name] [id number] 12/02/1940  CARE TEAM:  PCP: [name], FNP  Outpatient Care Team: Patient Care Team: [name], FNP as PCP - General (Nurse Practitioner)  Inpatient Treatment Team: Treatment Team: Attending Provider: Nolon Nations, MD; Consulting Physician: Edison Pace, Md, MD; Rounding Team: Fatima Blank, MD; Consulting Physician: Kathie Dike, MD; Consulting Physician: Mansouraty, Telford Nab., MD; Nurse Practitioner: Willia Craze, NP; Consulting Physician: Ladene Artist, MD; Mobility Specialist: Trevor Mace; Technician: Jeralyn Ruths, NT; Registered Nurse: Keturah Shavers, RN; Pharmacist: Lenis Noon, [facility]; Occupational Therapist: Julien Girt, OT; Utilization Review: Micah Noel, RN; Social Worker: Sherie Don, LCSW   Problem List:   Principal Problem:   Acute cholecystitis with chronic cholecystitis Active Problems:   Essential hypertension   GAD (generalized anxiety disorder)   GERD without esophagitis   Mixed hyperlipidemia   Thrombocytopenia ([place])   Chronic kidney disease, stage 3a ([place])   RUQ abdominal pain   Acute calculous cholecystitis   Elevated serum creatinine   Normocytic anemia   4 Days Post-Op  07/05/2022  Preoperative Diagnosis: Symptomatic cholelithiasis  Postoperative Diagnosis: Acute on chronic cholecystitis   Procedure: Laparoscopic subtotal fenestrating cholecystectomy with drain placement   Surgeon: Michaelle Birks, MD   Findings:  Acute on chronic cholecystitis with pus within the gallbladder. Fibrotic inflammation of the cystic triangle prevented safe dissection of the cystic duct, so a subtotal cholecystectomy was performed.   Assessment  Bile leak controlled with drain for now  [facility] Stay = 2 days)  Plan:  -Continue drain for now.    Nuclear medicine HIDA scan confirms bile leak in surgical field which enforces not surprising given the severely difficult subtotal cholecystectomy  that was possible.  Gastroenterology planning ERCP stenting later today.    -Continue antibiotics. - most likely can stop 23hour after ERCP  -Solid diet as tolerated post study  Improved pain control.   Better with oxycodone.  Scheduled Tylenol but follow LFTs.  Follow up pathology = benign.  Copy of path report on chart  Bowel regimen.  Doing [place] for now.  MiraLAX as needed.  He is rather hyperglycemic but denies diabetes.  Sliding scale insulin for now.  He has chronic kidney disease -creatinine seems to be normalizing to its 1.3 which is a guardedly hopeful sign.    Although the patient is worried and concerned and disappointed he is not normal, he is stable for now.  Hopefully with the ERCP and stenting and drain things will gradually improve.  He is very deconditioned which means his recovery will be prolonged.  But so far no major scary events.  I tried to reassure him.  He seemed mostly reassured   -VTE prophylaxis- SCDs, etc  -mobilize as tolerated to help recovery.  See what physical Occupational Therapy think.  Disposition: Patient normally lives by himself.  Apparently their daughter is nearby.  I do not think it safe for him to go home by himself.  Suspect he may need a SNF.  Will need input from therapy to see.  I have asked social work to again see this patient and not sign off.  Disposition:  The patient is from: Home  Anticipate discharge to:  [place] (SNF)  Anticipated Date of Discharge is:  August 25,2023    Barriers to discharge:  Pending Clinical improvement (more likely than not)  Patient currently is NOT MEDICALLY STABLE for discharge from the hospital from a surgery standpoint.  I reviewed nursing notes, last 24 h vitals and pain scores, last 48 h intake and output, last 24 h labs and trends, and last 24 h imaging results. I have reviewed this patient's available data, including medical history, events of note, test results, etc  as part of my evaluation.  A significant portion of that time was spent in counseling.  Care during the described time interval was provided by me.  This care required high  level of medical decision making.  07/09/2022    Subjective: (Chief complaint)  Patient back on his home Xanax and feels better.  Sitting up.  Or vomiting.  Pain better controlled.  Awaiting ERCP  Objective:  Vital signs:  Vitals:   07/08/22 0425 07/08/22 1431 07/08/22 2102 07/09/22 0525  BP: (!) 150/90 (!) 180/95 (!) 154/71 (!) 174/87  Pulse:  70 70 60  Resp:  '20 18 18  '$ Temp:  99.1 F (37.3 C) 98.4 F (36.9 C) 97.9 F (36.6 C)  TempSrc:  Oral Oral Oral  SpO2:  97% 97% 97%  Weight:      Height:        Last BM Date : 07/04/22  Intake/Output   Yesterday:  08/23 0701 - 08/24 0700 In: 2384.3 [P.O.:990; I.V.:900; IV Piggyback:494.3] Out: 2350 [Urine:2250; Drains:100] This shift:  Total I/O In: 0  Out: 320 [Urine:300; Drains:20]  Bowel function:  Flatus: YES  BM:  No  Drain:  Thinly bilious.   Physical Exam:  General: Pt awake/alert in mild acute distress.  Anxious but consolable. Eyes: PERRL, normal EOM.  Sclera clear.  No icterus Neuro: CN II-XII intact w/o focal sensory/motor deficits. Lymph: No head/neck/groin lymphadenopathy Psych:  No delerium/psychosis/paranoia.  Oriented x 4 HENT: Normocephalic, Mucus membranes moist.  No thrush Neck: Supple, No tracheal deviation.  No obvious thyromegaly Chest: No pain to chest wall compression.  Good respiratory excursion.  No audible wheezing CV:  Pulses intact.  Regular rhythm.  No major extremity edema MS: Normal AROM mjr joints.  No obvious deformity  Abdomen: Soft.  Obese.  Mildy distended.  Tenderness at right upper quadrant drain site. .  No evidence of peritonitis.  No incarcerated hernias.  Ext:   No deformity.  No mjr edema.  No cyanosis Skin: No petechiae / purpurea.  No major sores.  Warm and dry    Results:   SURGICAL  PATHOLOGY  CASE: [id number]  PATIENT: Francisco [name]  Surgical Pathology Report      Clinical History: Acute Cholecystitis      FINAL MICROSCOPIC DIAGNOSIS:   A. GALLBLADDER, CHOLECYSTECTOMY:  - Acute and chronic cholecystitis with cholelithiasis       Trypp Heckmann DESCRIPTION:   Size/?Intact: Received fresh is a disrupted gallbladder measuring 6.7 x  3.3 x 1.0 cm  Serosal surface: Pink-tan and smooth to mildly roughened  Mucosa/Wall: Pink and velvety without distinct lesions and a wall  thickness of 0.2 cm  Contents: Minimal amount of blood-tinged, viscous bile and 2 irregularly  shaped, green-black, roughened calculi measuring 1.7 and 2.4 cm  Cystic duct: The gallbladder is disrupted at the level of the cystic  duct and true patency cannot be determined  Block Summary:  Representative wall and the disruption at the level of the cystic duct  are submitted in 1 block.  Amparo Bristol, 07/06/2022)      Final Diagnosis performed by Jaquita Folds, MD.   Electronically  signed 07/07/2022  Technical and / or Professional components performed at [facility], [address] [place], [place] [address].   Immunohistochemistry Technical component (if applicable) was performed  at [facility]. [address], [place],  [place], [place] [address].   IMMUNOHISTOCHEMISTRY DISCLAIMER (if applicable):  Some of these immunohistochemical stains may have been developed and the  performance characteristics determine by [facility]. Some  may not have been cleared or approved by the U.S. Food and Drug  Administration. The FDA has determined that such clearance or approval  is not necessary. This test is used for clinical purposes. It should not  be regarded as investigational or for research. This laboratory is  certified under the [place]  (CLIA-88) as qualified to perform high complexity clinical  laboratory  testing.  The controls stained appropriately.   Cultures: Recent Results (from the past 720 hour(s))  Resp Panel by RT-PCR (Flu A&B, Covid)     Status: None   Collection Time: 07/05/22 12:32 PM   Specimen: Nasal Swab  Result Value Ref Range Status   SARS Coronavirus 2 by RT PCR NEGATIVE NEGATIVE Final    Comment: (NOTE) SARS-CoV-2 target nucleic acids are NOT DETECTED.  The SARS-CoV-2 RNA is generally detectable in upper respiratory specimens during the acute phase of infection. The lowest concentration of SARS-CoV-2 viral copies this assay can detect is 138 copies/mL. A negative result does not preclude SARS-Cov-2 infection and should not be used as the sole basis for treatment or other patient management decisions. A negative result may occur with  improper specimen collection/handling, submission of specimen other than nasopharyngeal swab, presence of viral mutation(s) within the areas targeted by this assay, and inadequate number of viral copies(<138 copies/mL). A negative result must be combined with clinical observations, patient history, and epidemiological information. The expected result is Negative.  Fact Sheet for Patients:  EntrepreneurPulse.com.au  Fact Sheet for Healthcare Providers:  IncredibleEmployment.be  This test is no t yet approved or cleared by the [place] FDA and  has been authorized for detection and/or diagnosis of SARS-CoV-2 by FDA under an Emergency Use Authorization (EUA). This EUA will remain  in effect (meaning this test can be used) for the duration of the COVID-19 declaration under Section 564(b)(1) of the Act, 21 U.S.C.section 360bbb-3(b)(1), unless the authorization is terminated  or revoked sooner.       Influenza A by PCR NEGATIVE NEGATIVE Final   Influenza B by PCR NEGATIVE NEGATIVE Final    Comment: (NOTE) The Xpert Xpress SARS-CoV-2/FLU/RSV plus assay is intended as an aid in the  diagnosis of influenza from Nasopharyngeal swab specimens and should not be used as a sole basis for treatment. Nasal washings and aspirates are unacceptable for Xpert Xpress SARS-CoV-2/FLU/RSV testing.  Fact Sheet for Patients: EntrepreneurPulse.com.au  Fact Sheet for Healthcare Providers: IncredibleEmployment.be  This test is not yet approved or cleared by the [place] FDA and has been authorized for detection and/or diagnosis of SARS-CoV-2 by FDA under an Emergency Use Authorization (EUA). This EUA will remain in effect (meaning this test can be used) for the duration of the COVID-19 declaration under Section 564(b)(1) of the Act, 21 U.S.C. section 360bbb-3(b)(1), unless the authorization is terminated or revoked.  Performed at [facility], [place] [address] [place], [place] [address]   Blood Culture (routine x 2)     Status: None   Collection Time: 07/05/22 12:32 PM   Specimen: BLOOD  Result Value Ref Range Status   Specimen Description   Final  BLOOD LEFT ANTECUBITAL Performed at [facility] [address], [place], [place] [address]    Special Requests   Final    BOTTLES DRAWN AEROBIC AND ANAEROBIC Blood Culture adequate volume Performed at [place] [address] [place], [place] [address]    Culture  Setup Time   Final    GRAM POSITIVE RODS AEROBIC BOTTLE ONLY CRITICAL RESULT CALLED TO, READ BACK BY AND VERIFIED WITH: E JACKSON,PHARMD'@0200'$  07/07/22 [place]    Culture   Final    TURICELLA OTITIDIS Standardized susceptibility testing for this organism is not available. Performed at [facility], [place] [address] [place], [place] [address]    Report Status 07/09/2022 FINAL  Final  Blood Culture (routine x 2)     Status: None (Preliminary result)   Collection Time: 07/06/22  3:29 AM   Specimen: BLOOD  Result Value Ref Range Status   Specimen Description    Final    BLOOD BLOOD RIGHT HAND Performed at [facility] [address], [place], [place] [address]    Special Requests   Final    BOTTLES DRAWN AEROBIC ONLY Blood Culture adequate volume Performed at [facility] [address] [place], [place] [address]    Culture   Final    NO GROWTH 3 DAYS Performed at [facility], [place] [address] [place], [place] [address]    Report Status PENDING  Incomplete  Urine Culture     Status: None   Collection Time: 07/06/22  2:00 PM   Specimen: Urine, Clean Catch  Result Value Ref Range Status   Specimen Description   Final    URINE, CLEAN CATCH Performed at [facility], [place] [address] [place], [place] [address]    Special Requests   Final    NONE Performed at [facility], [place] [address], [place], [place] [address]    Culture   Final    NO GROWTH Performed at [facility], [place] [address], [place], [place] [address]    Report Status 07/07/2022 FINAL  Final    Labs: Results for orders placed or performed during the hospital encounter of 07/05/22 (from the past 48 hour(s))  Glucose, capillary     Status: Abnormal   Collection Time: 07/07/22 11:31 AM  Result Value Ref Range   Glucose-Capillary 155 (H) 70 - 99 mg/dL    Comment: Glucose reference range applies only to samples taken after fasting for at least 8 hours.  Glucose, capillary     Status: Abnormal   Collection Time: 07/07/22  4:53 PM  Result Value Ref Range   Glucose-Capillary 152 (H) 70 - 99 mg/dL    Comment: Glucose reference range applies only to samples taken after fasting for at least 8 hours.  Glucose, capillary     Status: Abnormal   Collection Time: 07/07/22 10:40 PM  Result Value Ref Range   Glucose-Capillary 150 (H) 70 - 99 mg/dL    Comment: Glucose reference range applies only to samples taken after fasting for at least 8 hours.  Basic metabolic panel     Status: Abnormal    Collection Time: 07/08/22  4:53 AM  Result Value Ref Range   Sodium 142 135 - 145 mmol/L   Potassium 4.1 3.5 - 5.1 mmol/L   Chloride 110 98 - 111 mmol/L   CO2 25 22 - 32 mmol/L   Glucose, Bld 130 (H) 70 - 99  mg/dL    Comment: Glucose reference range applies only to samples taken after fasting for at least 8 hours.   BUN 19 8 - 23 mg/dL   Creatinine, Ser 1.20 0.61 - 1.24 mg/dL   Calcium 8.7 (L) 8.9 - 10.3 mg/dL   GFR, Estimated >60 >60 mL/min    Comment: (NOTE) Calculated using the CKD-EPI Creatinine Equation (2021)    Anion gap 7 5 - 15    Comment: Performed at [facility], [place] [address], [place], [place] [address]  Hemoglobin A1c     Status: None   Collection Time: 07/08/22  4:53 AM  Result Value Ref Range   Hgb A1c MFr Bld 5.3 4.8 - 5.6 %    Comment: (NOTE) Pre diabetes:          5.7%-6.4%  Diabetes:              >6.4%  Glycemic control for   <7.0% adults with diabetes    Mean Plasma Glucose 105.41 mg/dL    Comment: Performed at [place] [address] [place], [place] [address]  Magnesium     Status: None   Collection Time: 07/08/22  4:53 AM  Result Value Ref Range   Magnesium 2.0 1.7 - 2.4 mg/dL    Comment: Performed at [facility], [place] [address] [place], [place] [address]  Phosphorus     Status: Abnormal   Collection Time: 07/08/22  4:53 AM  Result Value Ref Range   Phosphorus 2.4 (L) 2.5 - 4.6 mg/dL    Comment: Performed at [facility], [place] [address] [place], [place] [address]  Hepatic function panel     Status: Abnormal   Collection Time: 07/08/22  4:53 AM  Result Value Ref Range   Total Protein 5.6 (L) 6.5 - 8.1 g/dL   Albumin 3.0 (L) 3.5 - 5.0 g/dL   AST 39 15 - 41 U/L   ALT 180 (H) 0 - 44 U/L   Alkaline Phosphatase 51 38 - 126 U/L   Total Bilirubin 0.6 0.3 - 1.2 mg/dL   Bilirubin, Direct 0.2 0.0 - 0.2 mg/dL   Indirect Bilirubin 0.4 0.3 - 0.9 mg/dL    Comment: Performed at [facility], [place] [address], [place], [place] [address]  Glucose, capillary     Status: Abnormal   Collection Time: 07/08/22  7:32 AM  Result Value Ref Range   Glucose-Capillary 138 (H) 70 - 99 mg/dL    Comment: Glucose reference range applies only to samples taken after fasting for at least 8 hours.  CBC with Differential/Platelet     Status: Abnormal   Collection Time: 07/08/22 10:24 AM  Result Value Ref Range   WBC 10.1 4.0 - 10.5 K/uL   RBC 3.50 (L) 4.22 - 5.81 MIL/uL   Hemoglobin 11.4 (L) 13.0 - 17.0 g/dL   HCT 33.3 (L) 39.0 - 52.0 %   MCV 95.1 80.0 - 100.0 fL   MCH 32.6 26.0 - 34.0 pg   MCHC 34.2 30.0 - 36.0 g/dL   RDW 13.3 11.5 - 15.5 %   Platelets 126 (L) 150 - 400 K/uL   nRBC 0.0 0.0 - 0.2 %   Neutrophils Relative % 87 %   Neutro Abs 8.9 (H) 1.7 - 7.7 K/uL   Lymphocytes Relative 6 %   Lymphs Abs 0.6 (L) 0.7 - 4.0 K/uL   Monocytes Relative 5 %   Monocytes Absolute 0.5 0.1 -  1.0 K/uL   Eosinophils Relative 1 %   Eosinophils Absolute 0.1 0.0 - 0.5 K/uL   Basophils Relative 0 %   Basophils Absolute 0.0 0.0 - 0.1 K/uL   Immature Granulocytes 1 %   Abs Immature Granulocytes 0.07 0.00 - 0.07 K/uL    Comment: Performed at [facility], [place] [address] [place], [place] [address]  Glucose, capillary     Status: Abnormal   Collection Time: 07/08/22 11:17 AM  Result Value Ref Range   Glucose-Capillary 140 (H) 70 - 99 mg/dL    Comment: Glucose reference range applies only to samples taken after fasting for at least 8 hours.  Glucose, capillary     Status: Abnormal   Collection Time: 07/08/22  4:24 PM  Result Value Ref Range   Glucose-Capillary 175 (H) 70 - 99 mg/dL    Comment: Glucose reference range applies only to samples taken after fasting for at least 8 hours.  Glucose, capillary     Status: Abnormal   Collection Time: 07/09/22 12:13 AM  Result Value Ref Range   Glucose-Capillary 119 (H) 70 - 99 mg/dL    Comment: Glucose reference range  applies only to samples taken after fasting for at least 8 hours.  CBC with Differential/Platelet     Status: Abnormal   Collection Time: 07/09/22  4:47 AM  Result Value Ref Range   WBC 9.0 4.0 - 10.5 K/uL   RBC 3.59 (L) 4.22 - 5.81 MIL/uL   Hemoglobin 11.7 (L) 13.0 - 17.0 g/dL   HCT 33.8 (L) 39.0 - 52.0 %   MCV 94.2 80.0 - 100.0 fL   MCH 32.6 26.0 - 34.0 pg   MCHC 34.6 30.0 - 36.0 g/dL   RDW 13.2 11.5 - 15.5 %   Platelets 160 150 - 400 K/uL   nRBC 0.0 0.0 - 0.2 %   Neutrophils Relative % 78 %   Neutro Abs 6.9 1.7 - 7.7 K/uL   Lymphocytes Relative 12 %   Lymphs Abs 1.1 0.7 - 4.0 K/uL   Monocytes Relative 8 %   Monocytes Absolute 0.7 0.1 - 1.0 K/uL   Eosinophils Relative 2 %   Eosinophils Absolute 0.2 0.0 - 0.5 K/uL   Basophils Relative 0 %   Basophils Absolute 0.0 0.0 - 0.1 K/uL   Immature Granulocytes 0 %   Abs Immature Granulocytes 0.04 0.00 - 0.07 K/uL    Comment: Performed at [facility], [place] [address], [place], [place] [address]  Comprehensive metabolic panel     Status: Abnormal   Collection Time: 07/09/22  4:47 AM  Result Value Ref Range   Sodium 140 135 - 145 mmol/L   Potassium 4.1 3.5 - 5.1 mmol/L   Chloride 108 98 - 111 mmol/L   CO2 26 22 - 32 mmol/L   Glucose, Bld 123 (H) 70 - 99 mg/dL    Comment: Glucose reference range applies only to samples taken after fasting for at least 8 hours.   BUN 17 8 - 23 mg/dL   Creatinine, Ser 1.40 (H) 0.61 - 1.24 mg/dL   Calcium 8.7 (L) 8.9 - 10.3 mg/dL   Total Protein 5.9 (L) 6.5 - 8.1 g/dL   Albumin 3.2 (L) 3.5 - 5.0 g/dL   AST 29 15 - 41 U/L   ALT 128 (H) 0 - 44 U/L   Alkaline Phosphatase 50 38 - 126 U/L   Total Bilirubin 0.8 0.3 - 1.2 mg/dL   GFR, Estimated 50 (L) >  60 mL/min    Comment: (NOTE) Calculated using the CKD-EPI Creatinine Equation (2021)    Anion gap 6 5 - 15    Comment: Performed at [facility], [place] [address], [place], [place] [address]  Phosphorus     Status:  Abnormal   Collection Time: 07/09/22  4:47 AM  Result Value Ref Range   Phosphorus 2.3 (L) 2.5 - 4.6 mg/dL    Comment: Performed at [facility], [place] [address] [place], [place] [address]  Magnesium     Status: None   Collection Time: 07/09/22  4:47 AM  Result Value Ref Range   Magnesium 2.0 1.7 - 2.4 mg/dL    Comment: Performed at [facility], [place] [address], [place], [place] [address]  Vitamin B12     Status: Abnormal   Collection Time: 07/09/22  4:47 AM  Result Value Ref Range   Vitamin B-12 1,111 (H) 180 - 914 pg/mL    Comment: (NOTE) This assay is not validated for testing neonatal or myeloproliferative syndrome specimens for Vitamin B12 levels. Performed at [facility], [place] [address], [place], [place] [address]   Folate     Status: None   Collection Time: 07/09/22  4:47 AM  Result Value Ref Range   Folate 8.7 >5.9 ng/mL    Comment: Performed at [facility], [place] [address], [place], [place] [address]  Iron and TIBC     Status: Abnormal   Collection Time: 07/09/22  4:47 AM  Result Value Ref Range   Iron 62 45 - 182 ug/dL   TIBC 209 (L) 250 - 450 ug/dL   Saturation Ratios 30 17.9 - 39.5 %   UIBC 147 ug/dL    Comment: Performed at [facility], [place] [address] [place], [place] [address]  Ferritin     Status: None   Collection Time: 07/09/22  4:47 AM  Result Value Ref Range   Ferritin 257 24 - 336 ng/mL    Comment: Performed at [facility], [place] [address] [place], [place] [address]  Reticulocytes     Status: Abnormal   Collection Time: 07/09/22  4:47 AM  Result Value Ref Range   Retic Ct Pct 1.6 0.4 - 3.1 %   RBC. 3.59 (L) 4.22 - 5.81 MIL/uL   Retic Count, Absolute 56.0 19.0 - 186.0 K/uL   Immature Retic Fract 14.3 2.3 - 15.9 %    Comment: Performed at [facility], [place] [address] [place], [place] [address]  Glucose, capillary      Status: Abnormal   Collection Time: 07/09/22  7:24 AM  Result Value Ref Range   Glucose-Capillary 125 (H) 70 - 99 mg/dL    Comment: Glucose reference range applies only to samples taken after fasting for at least 8 hours.    Imaging / Studies: NM HEPATOBILIARY LEAK (POST-SURGICAL)  Result Date: 07/07/2022 CLINICAL DATA:  Subtotal cholecystectomy performed. Postop day 1. Surgical drain in place. Concern for biliary leak EXAM: NUCLEAR MEDICINE HEPATOBILIARY IMAGING TECHNIQUE: Sequential images of the abdomen were obtained out to 60 minutes following intravenous administration of radiopharmaceutical. RADIOPHARMACEUTICALS:  5.3 mCi Tc-89m Choletec IV COMPARISON:  Ultrasound 07/05/2022 FINDINGS: Prompt clearance of radiotracer from blood pool and homogeneous uptake in liver. Counts are evident in the common bile duct and small bowel by 20 minutes. At 15 minutes, counts begin to collect along the inferior margin of the RIGHT hepatic lobe. These counts  spill into the peritoneal space along the RIGHT pericolic gutter consistent with biliary leak. IMPRESSION: Biliary leak with biliary excreted radiotracer collecting in the gallbladder fossa and spilling into the RIGHT pericolic gutter. Patent common bile duct. These results will be called to the ordering clinician or representative by the Radiologist Assistant, and communication documented in the PACS or [employer]. Electronically Signed   By: Suzy Bouchard M.D.   On: 07/07/2022 16:56    Medications / Allergies: per chart  Antibiotics: Anti-infectives (From admission, onward)    Start     Dose/Rate Route Frequency Ordered Stop   07/06/22 1245  ceFEPIme (MAXIPIME) 2 g in sodium chloride 0.9 % 100 mL IVPB        2 g 200 mL/hr over 30 Minutes Intravenous Every 12 hours 07/06/22 1158     07/05/22 1215  ceFEPIme (MAXIPIME) 2 g in sodium chloride 0.9 % 100 mL IVPB        2 g 200 mL/hr over 30 Minutes Intravenous  Once 07/05/22 1201 07/05/22 1313    07/05/22 1215  metroNIDAZOLE (FLAGYL) IVPB 500 mg        500 mg 100 mL/hr over 60 Minutes Intravenous Every 12 hours 07/05/22 1203           Note: Portions of this report may have been transcribed using voice recognition software. Every effort was made to ensure accuracy; however, inadvertent computerized transcription errors may be present.   Any transcriptional errors that result from this process are unintentional.    Adin Hector, MD, FACS, MASCRS Esophageal, Gastrointestinal & Colorectal Surgery Robotic and Minimally Invasive Surgery  [facility] [place] [facility]. [address] [place], [place] [address] [phone] Fax [phone] Main  CONTACT INFORMATION:  Weekday (9AM-5PM): Call [facility] main office at [phone]  Weeknight (5PM-9AM) or Weekend/Holiday: Check [email or web address] (password " [email or web address]") for General Surgery [facility] coverage  (Please, do not use SecureChat as it is not reliable communication to reach operating surgeons for immediate patient care)      07/09/2022  10:21 AM

## 2022-07-09 NOTE — Progress Notes (Addendum)
Progress Note  Primary GI: Dr. Henrene Pastor   Subjective  Chief Complaint: Bile leak  Continues to have some abdominal discomfort. Tmax 99.1. Patient currently n.p.o., denies nausea, vomiting. Took a Xanax last night and this morning had urinary incontinence, felt that he was still draining. Still not had a bowel movement, continue Colace, fiber.   Objective   Vital signs in last 24 hours: Temp:  [97.9 F (36.6 C)-99.1 F (37.3 C)] 97.9 F (36.6 C) (08/24 0525) Pulse Rate:  [60-70] 60 (08/24 0525) Resp:  [18-20] 18 (08/24 0525) BP: (154-180)/(71-95) 174/87 (08/24 0525) SpO2:  [97 %] 97 % (08/24 0525) Last BM Date : 07/04/22 Last BM recorded by nurses in past 5 days No data recorded  General:   male in no acute distress  Heart:  Regular rate and rhythm; no murmurs Pulm: Clear anteriorly; no wheezing Abdomen:  Soft, Obese AB, Active bowel sounds. mild tenderness in the upper abdomen. Without guarding and Without rebound, No organomegaly appreciated. Extremities:  with  trace edema. Neurologic:  Alert and  oriented x4;  No focal deficits.  Psych:  Cooperative. Normal mood and affect.  Intake/Output from previous day: 08/23 0701 - 08/24 0700 In: 2384.3 [P.O.:990; I.V.:900; IV Piggyback:494.3] Out: 2350 [Urine:2250; Drains:100] Intake/Output this shift: No intake/output data recorded.  Studies/Results: NM HEPATOBILIARY LEAK (POST-SURGICAL)  Result Date: 07/07/2022 CLINICAL DATA:  Subtotal cholecystectomy performed. Postop day 1. Surgical drain in place. Concern for biliary leak EXAM: NUCLEAR MEDICINE HEPATOBILIARY IMAGING TECHNIQUE: Sequential images of the abdomen were obtained out to 60 minutes following intravenous administration of radiopharmaceutical. RADIOPHARMACEUTICALS:  5.3 mCi Tc-75m Choletec IV COMPARISON:  Ultrasound 07/05/2022 FINDINGS: Prompt clearance of radiotracer from blood pool and homogeneous uptake in liver. Counts are evident in the common bile duct and  small bowel by 20 minutes. At 15 minutes, counts begin to collect along the inferior margin of the RIGHT hepatic lobe. These counts spill into the peritoneal space along the RIGHT pericolic gutter consistent with biliary leak. IMPRESSION: Biliary leak with biliary excreted radiotracer collecting in the gallbladder fossa and spilling into the RIGHT pericolic gutter. Patent common bile duct. These results will be called to the ordering clinician or representative by the Radiologist Assistant, and communication documented in the PACS or [employer] Electronically Signed   By: SSuzy BouchardM.D.   On: 07/07/2022 16:56    Lab Results: Recent Labs    07/07/22 0033 07/08/22 1024 07/09/22 0447  WBC 10.4 10.1 9.0  HGB 10.9* 11.4* 11.7*  HCT 33.9* 33.3* 33.8*  PLT 107* 126* 160   BMET Recent Labs    07/07/22 0033 07/08/22 0453 07/09/22 0447  NA 139 142 140  K 4.3 4.1 4.1  CL 109 110 108  CO2 '24 25 26  '$ GLUCOSE 141* 130* 123*  BUN 25* 19 17  CREATININE 1.50* 1.20 1.40*  CALCIUM 8.1* 8.7* 8.7*   LFT Recent Labs    07/08/22 0453 07/09/22 0447  PROT 5.6* 5.9*  ALBUMIN 3.0* 3.2*  AST 39 29  ALT 180* 128*  ALKPHOS 51 50  BILITOT 0.6 0.8  BILIDIR 0.2  --   IBILI 0.4  --       Patient profile:   81year old male with bile duct leak following subtotal cholecystectomy on 8/20   Impression/Plan:   Bile duct leak s/p cholecystectomy On maxipime and flagyl On lovenox for VCE - on hold for ERCP NPO at this time Plan for ERCP today - reviewed with patient.  Anemia Normal iron/ferritin likely secondary CKD  Thrombocytopenia  Improved, likely secondary inflammation/ABX  Chronic constipation Continue current medications Can add miralax   LOS: 2 days   Vladimir Crofts  07/09/2022, 8:13 AM   Attending Physician Note   I have taken an interval history, reviewed the chart and examined the patient. I performed a substantive portion of this encounter, including  complete performance of at least one of the key components, in conjunction with the APP. I agree with the APP's note, impression and recommendations with my edits. My additional impressions and recommendations are as follows.   Bile leak S/P lap subtotal cholecystectomy on 8/20 for cholelithiasis, cholecystitis. For ERCP with biliary stent placement today.   Lucio Edward, MD [facility], [facility], for our on call provider

## 2022-07-09 NOTE — Plan of Care (Signed)
  Problem: Clinical Measurements: Goal: Ability to maintain clinical measurements within normal limits will improve Outcome: Progressing   Problem: Activity: Goal: Risk for activity intolerance will decrease Outcome: Progressing   Problem: Pain Managment: Goal: General experience of comfort will improve Outcome: Progressing   

## 2022-07-09 NOTE — Evaluation (Signed)
Occupational Therapy Evaluation Patient Details Name: Francisco [name] MRN: [id number] DOB: [birth year] Today's Date: 07/09/2022   History of Present Illness Pt admitted with abdominal pain and now s/p lap chole 07/05/22 and with hx of back surgery   Clinical Impression   Patient evaluated by Occupational Therapy with no further acute OT needs identified. All education has been completed and the patient has no further questions. Pt to receive 24/7 supervision at home from family which is recommended due to memory/cognitive deficits.  See below for any follow-up Occupational Therapy or equipment needs. OT is signing off. Thank you for this referral.       Recommendations for follow up therapy are one component of a multi-disciplinary discharge planning process, led by the attending physician.  Recommendations may be updated based on patient status, additional functional criteria and insurance authorization.   Follow Up Recommendations  No OT follow up    Assistance Recommended at Discharge Frequent or constant Supervision/Assistance  Patient can return home with the following Direct supervision/assist for financial management;Assist for transportation;Assistance with cooking/housework;Direct supervision/assist for medications management    Functional Status Assessment  Patient has had a recent decline in their functional status and demonstrates the ability to make significant improvements in function in a reasonable and predictable amount of time.  Equipment Recommendations       Recommendations for Other Services       Precautions / Restrictions Precautions Precautions: Fall Precaution Comments: JP drain on R Restrictions Weight Bearing Restrictions: No      Mobility Bed Mobility Overal bed mobility: Needs Assistance Bed Mobility: Supine to Sit     Supine to sit: Supervision     General bed mobility comments: Increased time with supervision for safety only.  No physical  assist    Transfers                          Balance Overall balance assessment: Needs assistance   Sitting balance-Leahy Scale: Good     Standing balance support: No upper extremity supported, During functional activity Standing balance-Leahy Scale: Fair                             ADL either performed or assessed with clinical judgement   ADL Overall ADL's : At baseline                                     Functional mobility during ADLs: Supervision/safety;Rolling walker (2 wheels) General ADL Comments: Pt is able to demonstrate his basic functional mobility and basic ADLs at or near his baseline. No family present and pt is confused, perseverating on a memory vs dream he had, but redirectable. Pt confirmed that his daughter will be able to provide 24/7 supervision.     Vision Baseline Vision/History: 1 Wears glasses Ability to See in Adequate Light: 0 Adequate Vision Assessment?: No apparent visual deficits     Perception     Praxis      Pertinent Vitals/Pain Pain Assessment Pain Assessment: 0-10 Pain Score: 3  Pain Location: lower back and JP site Pain Descriptors / Indicators: Aching Pain Intervention(s): Limited activity within patient's tolerance, Monitored during session, Repositioned     Hand Dominance Right   Extremity/Trunk Assessment Upper Extremity Assessment Upper Extremity Assessment: Overall WFL for tasks assessed   Lower Extremity Assessment  Lower Extremity Assessment: Overall WFL for tasks assessed       Communication Communication Communication: No difficulties   Cognition Arousal/Alertness: Awake/alert Behavior During Therapy: Anxious, WFL for tasks assessed/performed Overall Cognitive Status: History of cognitive impairments - at baseline (Memory deficits but oriented and following instructions well.)                                 General Comments: Pt alert and oriented but  perseverating on incident of incontinence this am, redirectable.     General Comments       Exercises     Shoulder Instructions      Home Living Family/patient expects to be discharged to:: Private residence Living Arrangements: Alone Available Help at Discharge: Family;Available 24 hours/day Type of Home: House Home Access: Stairs to enter [employer] of Steps: 3 Entrance Stairs-Rails: Right Home Layout: One level     Bathroom Shower/Tub: Teacher, early years/pre: Handicapped height     Home Equipment: Conservation officer, nature (2 wheels);Cane - single point;Shower seat;BSC/3in1   Additional Comments: Pt states youngest dtr will be staying with him and older dtr is available near by      Prior Functioning/Environment Prior Level of Function : Independent/Modified Independent                        OT Problem List: Decreased activity tolerance;Decreased cognition      OT Treatment/Interventions:      OT Goals(Current goals can be found in the care plan section) Acute Rehab OT Goals OT Goal Formulation: All assessment and education complete, DC therapy Potential to Achieve Goals: Good  OT Frequency:      Co-evaluation PT/OT/SLP Co-Evaluation/Treatment: Yes Reason for Co-Treatment: For patient/therapist safety PT goals addressed during session: Mobility/safety with mobility OT goals addressed during session: ADL's and self-care      AM-PAC OT "6 Clicks" Daily Activity     Outcome Measure Help from another person eating meals?: None Help from another person taking care of personal grooming?: None Help from another person toileting, which includes using toliet, bedpan, or urinal?: A Little Help from another person bathing (including washing, rinsing, drying)?: A Little Help from another person to put on and taking off regular upper body clothing?: None Help from another person to put on and taking off regular lower body clothing?: A Little 6  Click Score: 21   End of Session Equipment Utilized During Treatment: Gait belt;Rolling walker (2 wheels) Nurse Communication: Mobility status  Activity Tolerance: Patient tolerated treatment well Patient left: in chair;with call bell/phone within reach;with chair alarm set  OT Visit Diagnosis: Other symptoms and signs involving cognitive function                Time: 4270-6237 OT Time Calculation (min): 24 min Charges:  OT General Charges $OT Visit: 1 Visit OT Evaluation $OT Eval Low Complexity: 1 Low  Chyenne Sobczak, OT Acute Rehab Services Office: [phone] 07/09/2022  Julien Girt 07/09/2022, 11:09 AM

## 2022-07-09 NOTE — Anesthesia Preprocedure Evaluation (Addendum)
Anesthesia Evaluation  Patient identified by MRN, date of birth, ID band Patient awake    Reviewed: Allergy & Precautions, NPO status , Patient's Chart, lab work & pertinent test results, reviewed documented beta blocker date and time   History of Anesthesia Complications Negative for: history of anesthetic complications  Airway Mallampati: II  TM Distance: >3 FB Neck ROM: Full    Dental  (+) Dental Advisory Given, Missing   Pulmonary asthma , former smoker,    Pulmonary exam normal        Cardiovascular hypertension, Pt. on home beta blockers and Pt. on medications + Peripheral Vascular Disease  Normal cardiovascular exam   '23 TTE - Right Carotid: Velocities in the right ICA are consistent with a 1-39% stenosis.  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 Non-hemodynamically significant plaque <50% in the CCA. 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   '23 TTE - EF 60 to 65%. Grade I diastolic dysfunction (impaired relaxation). No valvulopathy    Neuro/Psych PSYCHIATRIC DISORDERS Anxiety negative neurological ROS     GI/Hepatic Neg liver ROS, GERD  Controlled,  Endo/Other   Obesity   Renal/GU Renal InsufficiencyRenal disease     Musculoskeletal negative musculoskeletal ROS (+)   Abdominal   Peds  Hematology  (+) Blood dyscrasia, anemia ,   Anesthesia Other Findings   Reproductive/Obstetrics                            Anesthesia Physical Anesthesia Plan  ASA: 2  Anesthesia Plan: General   Post-op Pain Management: Minimal or no pain anticipated   Induction: Intravenous  PONV Risk Score and Plan: 2 and Treatment may vary due to age or medical condition and Ondansetron  Airway Management Planned: Oral ETT  Additional Equipment: None  Intra-op Plan:   Post-operative Plan: Extubation in OR  Informed Consent: I have reviewed the patients History and  Physical, chart, labs and discussed the procedure including the risks, benefits and alternatives for the proposed anesthesia with the patient or authorized representative who has indicated his/her understanding and acceptance.     Dental advisory given  Plan Discussed with: CRNA and Anesthesiologist  Anesthesia Plan Comments:        Anesthesia Quick Evaluation

## 2022-07-09 NOTE — Evaluation (Signed)
Physical Therapy Evaluation Patient Details Name: Francisco [name] MRN: [id number] DOB: [birth year] Today's Date: 07/09/2022  History of Present Illness  Pt admitted with abdominal pain and now s/p lap chole 07/05/22 and with hx of back surgery  Clinical Impression  Pt admitted as above and presenting with functional mobility limitations 2* post op pain and ambulatory balance deficits.  Pt plans dc home with temporary assist of dtr and would benefit from follow up HHPT to regain prior level of IND function.     Recommendations for follow up therapy are one component of a multi-disciplinary discharge planning process, led by the attending physician.  Recommendations may be updated based on patient status, additional functional criteria and insurance authorization.  Follow Up Recommendations Home health PT      Assistance Recommended at Discharge Frequent or constant Supervision/Assistance  Patient can return home with the following  A little help with walking and/or transfers;A little help with bathing/dressing/bathroom;Assist for transportation;Help with stairs or ramp for entrance;Assistance with cooking/housework    Equipment Recommendations None recommended by PT  Recommendations for Other Services       Functional Status Assessment Patient has had a recent decline in their functional status and demonstrates the ability to make significant improvements in function in a reasonable and predictable amount of time.     Precautions / Restrictions Precautions Precautions: Fall Precaution Comments: JP drain on R Restrictions Weight Bearing Restrictions: No      Mobility  Bed Mobility Overal bed mobility: Needs Assistance Bed Mobility: Supine to Sit     Supine to sit: Supervision     General bed mobility comments: Increased time with supervision for safety only.  No physical assist    Transfers Overall transfer level: Needs assistance Equipment used: Rolling walker (2  wheels) Transfers: Sit to/from Stand Sit to Stand: Min guard, Supervision           General transfer comment: for safety only; no physical assist    Ambulation/Gait Ambulation/Gait assistance: Min guard, Supervision Gait Distance (Feet): 400 Feet Assistive device: Rolling walker (2 wheels) Gait Pattern/deviations: Step-through pattern, Decreased step length - right, Decreased step length - left, Shuffle, Trunk flexed       General Gait Details: min cues for posture and position from RW; Pt declines to attempt ambulation sans AD stating he felt safer/steadier with it  Stairs            Wheelchair Mobility    Modified Rankin (Stroke Patients Only)       Balance Overall balance assessment: Needs assistance Sitting-balance support: Feet supported, No upper extremity supported Sitting balance-Leahy Scale: Good     Standing balance support: No upper extremity supported Standing balance-Leahy Scale: Fair                               Pertinent Vitals/Pain Pain Assessment Pain Assessment: 0-10 Pain Score: 3  Pain Location: lower back Pain Descriptors / Indicators: Aching Pain Intervention(s): Limited activity within patient's tolerance, Monitored during session, Repositioned    Home Living Family/patient expects to be discharged to:: Private residence Living Arrangements: Alone Available Help at Discharge: Family;Available 24 hours/day Type of Home: House Home Access: Stairs to enter Entrance Stairs-Rails: Right Entrance Stairs-Number of Steps: 3   Home Layout: One level Home Equipment: Conservation officer, nature (2 wheels);Cane - single point;Shower seat;BSC/3in1 Additional Comments: Pt states youngest dtr will be staying with him and older dtr is available near by  Prior Function Prior Level of Function : Independent/Modified Independent                     Hand Dominance        Extremity/Trunk Assessment   Upper Extremity  Assessment Upper Extremity Assessment: Overall WFL for tasks assessed    Lower Extremity Assessment Lower Extremity Assessment: Overall WFL for tasks assessed       Communication   Communication: No difficulties  Cognition Arousal/Alertness: Awake/alert Behavior During Therapy: Anxious, WFL for tasks assessed/performed Overall Cognitive Status: Within Functional Limits for tasks assessed                                 General Comments: Pt alert and oriented but perseverating on incident of incontinence this am        General Comments      Exercises     Assessment/Plan    PT Assessment Patient needs continued PT services  PT Problem List Decreased activity tolerance;Decreased balance;Decreased mobility;Decreased knowledge of use of DME;Pain       PT Treatment Interventions DME instruction;Gait training;Stair training;Functional mobility training;Therapeutic activities;Therapeutic exercise;Balance training;Patient/family education    PT Goals (Current goals can be found in the Care Plan section)  Acute Rehab PT Goals Patient Stated Goal: Regain IND PT Goal Formulation: With patient Time For Goal Achievement: 07/22/22 Potential to Achieve Goals: Good    Frequency Min 3X/week     Co-evaluation               AM-PAC PT "6 Clicks" Mobility  Outcome Measure Help needed turning from your back to your side while in a flat bed without using bedrails?: None Help needed moving from lying on your back to sitting on the side of a flat bed without using bedrails?: None Help needed moving to and from a bed to a chair (including a wheelchair)?: A Little Help needed standing up from a chair using your arms (e.g., wheelchair or bedside chair)?: A Little Help needed to walk in hospital room?: A Little Help needed climbing 3-5 steps with a railing? : A Lot 6 Click Score: 19    End of Session Equipment Utilized During Treatment: Gait belt Activity Tolerance:  Patient tolerated treatment well Patient left: in chair;with call bell/phone within reach;with chair alarm set Nurse Communication: Mobility status PT Visit Diagnosis: Unsteadiness on feet (R26.81);Pain    Time: 4888-9169 PT Time Calculation (min) (ACUTE ONLY): 25 min   Charges:   PT Evaluation $PT Eval Low Complexity: 1 Low          [place] Pager [phone] Office [phone]   Vearl Allbaugh 07/09/2022, 10:04 AM

## 2022-07-09 NOTE — Anesthesia Procedure Notes (Addendum)
Procedure Name: Intubation Date/Time: 07/09/2022 3:36 PM  Performed by: Milford Cage, CRNAPre-anesthesia Checklist: Patient identified, Emergency Drugs available, Suction available and Patient being monitored Patient Re-evaluated:Patient Re-evaluated prior to induction Oxygen Delivery Method: Circle system utilized Preoxygenation: Pre-oxygenation with 100% oxygen Induction Type: IV induction Ventilation: Mask ventilation without difficulty Laryngoscope Size: Glidescope and 4 Grade View: Grade I Tube type: Oral Tube size: 7.5 mm Number of attempts: 1 Airway Equipment and Method: Stylet Placement Confirmation: ETT inserted through vocal cords under direct vision, positive ETCO2 and breath sounds checked- equal and bilateral Secured at: 23 cm Tube secured with: Tape Dental Injury: Teeth and Oropharynx as per pre-operative assessment  Difficulty Due To: Difficult Airway- due to reduced neck mobility and Difficult Airway- due to anterior larynx Future Recommendations: Recommend- induction with short-acting agent, and alternative techniques readily available Comments: Grade 2b with Mil 2. Grade 1 with GS4

## 2022-07-09 NOTE — TOC Initial Note (Signed)
Transition of Care [facility]) - Initial/Assessment Note   Patient Details  Name: Francisco [name] MRN: [id number] Date of Birth: 1941-05-20  Transition of Care [facility]) CM/SW Contact:    Sherie Don, LCSW Phone Number: 07/09/2022, 2:38 PM  Clinical Narrative: [facility] consulted for SNF, but patient does not meet criteria medically or therapeutically. PT evaluation recommended HHPT. CSW met with patient to discuss recommendations. Patient is agreeable to [facility] and reported his daughter will be staying with him at home after discharge. Patient has a cane, wheelchair, and grab bar in his bathroom so he reported no DME needs at this time.  CSW made [facility] referral to Cindie with [facility], which was accepted. TOC awaiting HHPT orders.  Expected Discharge Plan: [place] Barriers to Discharge: Continued Medical Work up  Patient Goals and CMS Choice Patient states their goals for this hospitalization and ongoing recovery are:: Return home with daughter CMS Medicare.gov Compare Post Acute Care list provided to:: Patient Choice offered to / list presented to : Patient  Expected Discharge Plan and Services Expected Discharge Plan: [place] In-house Referral: Clinical Social Work Post Acute Care Choice: [place] arrangements for the past 2 months: Single Family Home           DME Arranged: N/A DME Agency: NA HH Arranged: PT HH Agency: [place] Date [place]: 07/09/22 Time [place]: [id number] Representative spoke with at [place]: Cindie  Prior Living Arrangements/Services Living arrangements for the past 2 months: [place] Patient language and need for interpreter reviewed:: Yes Do you feel safe going back to the place where you live?: Yes      Need for Family Participation in Patient Care: No (Comment) Care giver support system in place?: Yes (comment) Current home services: DME (Cane, wheelchair) Criminal  Activity/Legal Involvement Pertinent to Current Situation/Hospitalization: No - Comment as needed  Activities of Daily Living Home Assistive Devices/Equipment: Cane (specify quad or straight), Eyeglasses, Grab bars in shower ADL Screening (condition at time of admission) Patient's cognitive ability adequate to safely complete daily activities?: Yes Is the patient deaf or have difficulty hearing?: No Does the patient have difficulty seeing, even when wearing glasses/contacts?: No Does the patient have difficulty concentrating, remembering, or making decisions?: Yes Patient able to express need for assistance with ADLs?: Yes Does the patient have difficulty dressing or bathing?: No Independently performs ADLs?: Yes (appropriate for developmental age) Does the patient have difficulty walking or climbing stairs?: Yes Weakness of Legs: None Weakness of Arms/Hands: None  Permission Sought/Granted Permission sought to share information with : Other (comment) Permission granted to share information with : Yes, Verbal Permission Granted Permission granted to share info w AGENCY: [place] agencies  Emotional Assessment Appearance:: Appears stated age Attitude/Demeanor/Rapport: Engaged Affect (typically observed): Accepting Orientation: : Oriented to Self, Oriented to Place, Oriented to  Time, Oriented to Situation Alcohol / Substance Use: Not Applicable  Admission diagnosis:  Acute calculous cholecystitis [K80.00] Patient Active Problem List   Diagnosis Date Noted   Bile leak, postoperative    Constipation    Elevated serum creatinine 07/06/2022   Normocytic anemia 07/06/2022   RUQ abdominal pain 07/05/2022   Acute calculous cholecystitis 07/05/2022   Lactic acidosis 05/06/2022   Thrombocytopenia ([place]) 05/06/2022   Chronic kidney disease, stage 3a ([place]) 05/06/2022   Acute cholecystitis with chronic cholecystitis 03/17/2022   Peripheral vascular disease, unspecified ([place]) carotic arterial  disease 12/16/2021   Chest  pain of uncertain etiology 97/12/6376   Anxiety 12/10/2021   BMI 31.0-31.9,adult 09/06/2015   BPH (benign prostatic hyperplasia) 08/06/2015   Asthma, chronic 04/04/2015   Essential hypertension 04/12/2013   GAD (generalized anxiety disorder) 04/12/2013   GERD without esophagitis 04/12/2013   Mixed hyperlipidemia 04/12/2013   PCP:  [name], FNP Pharmacy:   [facility] ([address] [place] [address] Phone: [phone] Fax: [phone]  [facility] ([facility]) - [place], [address] [place] [address] Phone: [phone] Fax: [place] [address] [address] [place] [address] Phone: [phone] Fax: [phone]  Readmission Risk Interventions     No data to display

## 2022-07-09 NOTE — Interval H&P Note (Signed)
History and Physical Interval Note:  07/09/2022 3:13 PM  Francisco [name]  has presented today for surgery, with the diagnosis of bile duct leak.  The various methods of treatment have been discussed with the patient and family. After consideration of risks, benefits and other options for treatment, the patient has consented to  Procedure(s): ENDOSCOPIC RETROGRADE CHOLANGIOPANCREATOGRAPHY (ERCP) (N/A) as a surgical intervention.  The patient's history has been reviewed, patient examined, no change in status, stable for surgery.  I have reviewed the patient's chart and labs.  Questions were answered to the patient's satisfaction.     Pricilla Riffle. [place] Plan

## 2022-07-09 NOTE — Evaluation (Signed)
SLP Cancellation Note  Patient Details Name: Francisco [name] MRN: [id number] DOB: 09/28/1941   Cancelled treatment:       Reason Eval/Treat Not Completed: Other (comment) (pt npo for procedure today per notes)   Kathleen Lime, MS [place] Office [phone] Pager [phone]  Macario Golds 07/09/2022, 7:54 AM

## 2022-07-09 NOTE — H&P (View-Only) (Signed)
Progress Note  Primary GI: Dr. Henrene Pastor   Subjective  Chief Complaint: Bile leak  Continues to have some abdominal discomfort. Tmax 99.1. Patient currently n.p.o., denies nausea, vomiting. Took a Xanax last night and this morning had urinary incontinence, felt that he was still draining. Still not had a bowel movement, continue Colace, fiber.   Objective   Vital signs in last 24 hours: Temp:  [97.9 F (36.6 C)-99.1 F (37.3 C)] 97.9 F (36.6 C) (08/24 0525) Pulse Rate:  [60-70] 60 (08/24 0525) Resp:  [18-20] 18 (08/24 0525) BP: (154-180)/(71-95) 174/87 (08/24 0525) SpO2:  [97 %] 97 % (08/24 0525) Last BM Date : 07/04/22 Last BM recorded by nurses in past 5 days No data recorded  General:   male in no acute distress  Heart:  Regular rate and rhythm; no murmurs Pulm: Clear anteriorly; no wheezing Abdomen:  Soft, Obese AB, Active bowel sounds. mild tenderness in the upper abdomen. Without guarding and Without rebound, No organomegaly appreciated. Extremities:  with  trace edema. Neurologic:  Alert and  oriented x4;  No focal deficits.  Psych:  Cooperative. Normal mood and affect.  Intake/Output from previous day: 08/23 0701 - 08/24 0700 In: 2384.3 [P.O.:990; I.V.:900; IV Piggyback:494.3] Out: 2350 [Urine:2250; Drains:100] Intake/Output this shift: No intake/output data recorded.  Studies/Results: NM HEPATOBILIARY LEAK (POST-SURGICAL)  Result Date: 07/07/2022 CLINICAL DATA:  Subtotal cholecystectomy performed. Postop day 1. Surgical drain in place. Concern for biliary leak EXAM: NUCLEAR MEDICINE HEPATOBILIARY IMAGING TECHNIQUE: Sequential images of the abdomen were obtained out to 60 minutes following intravenous administration of radiopharmaceutical. RADIOPHARMACEUTICALS:  5.3 mCi Tc-33m Choletec IV COMPARISON:  Ultrasound 07/05/2022 FINDINGS: Prompt clearance of radiotracer from blood pool and homogeneous uptake in liver. Counts are evident in the common bile duct and  small bowel by 20 minutes. At 15 minutes, counts begin to collect along the inferior margin of the RIGHT hepatic lobe. These counts spill into the peritoneal space along the RIGHT pericolic gutter consistent with biliary leak. IMPRESSION: Biliary leak with biliary excreted radiotracer collecting in the gallbladder fossa and spilling into the RIGHT pericolic gutter. Patent common bile duct. These results will be called to the ordering clinician or representative by the Radiologist Assistant, and communication documented in the PACS or [employer] Electronically Signed   By: SSuzy BouchardM.D.   On: 07/07/2022 16:56    Lab Results: Recent Labs    07/07/22 0033 07/08/22 1024 07/09/22 0447  WBC 10.4 10.1 9.0  HGB 10.9* 11.4* 11.7*  HCT 33.9* 33.3* 33.8*  PLT 107* 126* 160   BMET Recent Labs    07/07/22 0033 07/08/22 0453 07/09/22 0447  NA 139 142 140  K 4.3 4.1 4.1  CL 109 110 108  CO2 '24 25 26  '$ GLUCOSE 141* 130* 123*  BUN 25* 19 17  CREATININE 1.50* 1.20 1.40*  CALCIUM 8.1* 8.7* 8.7*   LFT Recent Labs    07/08/22 0453 07/09/22 0447  PROT 5.6* 5.9*  ALBUMIN 3.0* 3.2*  AST 39 29  ALT 180* 128*  ALKPHOS 51 50  BILITOT 0.6 0.8  BILIDIR 0.2  --   IBILI 0.4  --       Patient profile:   81year old male with bile duct leak following subtotal cholecystectomy on 8/20   Impression/Plan:   Bile duct leak s/p cholecystectomy On maxipime and flagyl On lovenox for VCE - on hold for ERCP NPO at this time Plan for ERCP today - reviewed with patient.  Anemia Normal iron/ferritin likely secondary CKD  Thrombocytopenia  Improved, likely secondary inflammation/ABX  Chronic constipation Continue current medications Can add miralax   LOS: 2 days   Vladimir Crofts  07/09/2022, 8:13 AM   Attending Physician Note   I have taken an interval history, reviewed the chart and examined the patient. I performed a substantive portion of this encounter, including  complete performance of at least one of the key components, in conjunction with the APP. I agree with the APP's note, impression and recommendations with my edits. My additional impressions and recommendations are as follows.   Bile leak S/P lap subtotal cholecystectomy on 8/20 for cholelithiasis, cholecystitis. For ERCP with biliary stent placement today.   Lucio Edward, MD [facility] See [facility], [facility], for our on call provider

## 2022-07-09 NOTE — Progress Notes (Signed)
PROGRESS NOTE    Francisco [name]  [name] DOB: 23-Nov-1940 DOA: 07/05/2022 PCP: Francisco Pretty, FNP   Brief Narrative:  The patient is an 81 year old Caucasian male with past medical history significant for but limited to hypertension, hyperlipidemia, GERD, chronic kidney disease stage IIIa, as well as other comorbidities who was admitted to the hospital under surgical service for acute on chronic cholecystitis and underwent cholecystectomy.  Postoperatively patient ended up having a bile leak and hospitalist service was consulted to help address his medical issues.  He has been consulted for his subsequent bile leak and he has a percutaneous biliary drain in place.  He continues to have significant discomfort and is on IV cefepime and Flagyl and the plan is for ERCP tomorrow with possibly biliary stenting and decompression of bile duct to help the bile leak seal.   Assessment and Plan:  Acute on chronic cholecystitis with complication of a bile leak Abnormal LFTs -Status post cholecystectomy with drain placement: General surgery recommending continuing drain today -Currently on IV antibiotics with cefepime and metronidazole which we will continue -Patient underwent a nuclear medicine HIDA scan which was concerning for a leak and general surgery is not a surprise given his severe cholecystitis and difficult subtotal cholecystectomy and have asked GI to evaluate for ERCP and stenting and plan is for ERCP with likely biliary stent and decompress the bile duct to help the bile leak still. -AST went from 39 -> 29 -ALT went from 180 -> 128 -Plan is for ERCP today as he is NPO and Lovenox held for ERCP; after his ERCP they are recommending a solid diet -Pathology has been benign from his surgery -Postoperative care per general surgery -PT/OT recommending Home Health    Chronic Kidney Disease Stage IIIa -Baseline creatinine approximately 1.5 -Creatinine appears to be near baseline and  now BUNs/creatinine has gone from 19/1.20 -> 17/1.40 -Continue to monitor -Appears that he is chronically on meloxicam as an outpatient.  Would not resume this on discharge -Outpatient Lasix dose currently on hold.  Resume once he has adequate p.o. intake -Avoid further nephrotoxic medications, contrast dyes, hypotension and dehydration to ensure adequate renal perfusion if possible and renally dose medications -Repeat CMP in a.m.   Hyperlipidemia -Continued statin but may need to hold given his slightly abnormal LFTs with an elevated ALT    Hypertension -Continued on Toprol -Blood pressure currently stable we will need to monitor blood pressures carefully -Last BP reading was elevated at 174/87   GERD/GI prophylaxis -Continue PPI  Constipation -GI and general surgery following and he is on fiber and MiraLAX   BPH -Continued on Tamsulosin    Hyperglycemia -Noted to have fasting blood sugar of 126 this -Check A1c and was not elevated and was 5.3   Hypophosphatemia -Patient's Phos level is now 2.3 -Replete with p.o. K Phos neutral 500 mg x 1 -Continue monitor and trend and repeat Phos level in a.m.   Hypoalbuminemia -Patient's albumin level is 3.0 -> 3.2 -Continue monitor and trend and repeat CMP in a.m.   Macrocytic anemia/normocytic anemia -Patient's hemoglobin/hematocrit went from 10.9/33.9 with a MCV of 101.8 -> 11.4/33.3 with an MCV of 95.1 and is now 11.7/33.8 with an MVC of 94.2 -Checked EMEA panel and showed an iron level of 62, U IBC 147, TIBC 209, saturation ratio 30%, ferritin level 257, folate of 8.7, vitamin B12 1111 -Continue monitor for signs and symptoms bleeding; no overt bleeding noted -Repeat CBC in a.m.   Thrombocytopenia - Improved  as Patient's platelet count has gone from 107 -> 126 -> 160 -Continue monitor and trend and evaluate for signs and symptoms of bleeding and repeat CBC in a.m.   DVT prophylaxis: SCD's Start: 07/05/22 1507    Code Status:  Full Code Family Communication: No family currently at bedside  Disposition Plan:  Level of care: Med-Surg Status is: Inpatient Remains inpatient appropriate because: Needs further clinical work-up and clearance by general surgery and gastroenterology; undergoing ERCP later this afternoon   Consultants:  General surgery Gastroenterology [facility] hospitalist  Procedures:  Procedure: Laparoscopic subtotal fenestrating cholecystectomy with drain placement  Antimicrobials:  Anti-infectives (From admission, onward)    Start     Dose/Rate Route Frequency Ordered Stop   07/06/22 1245  ceFEPIme (MAXIPIME) 2 g in sodium chloride 0.9 % 100 mL IVPB        2 g 200 mL/hr over 30 Minutes Intravenous Every 12 hours 07/06/22 1158     07/05/22 1215  ceFEPIme (MAXIPIME) 2 g in sodium chloride 0.9 % 100 mL IVPB        2 g 200 mL/hr over 30 Minutes Intravenous  Once 07/05/22 1201 07/05/22 1313   07/05/22 1215  metroNIDAZOLE (FLAGYL) IVPB 500 mg        500 mg 100 mL/hr over 60 Minutes Intravenous Every 12 hours 07/05/22 1203         Subjective: Seen and examined and he is sitting in the chair at bedside awaiting to go for his ERCP.  States that he felt tired and wanted to get back in the bed.  No chest pain or shortness breath.  States his abdominal discomfort is improving.  Denies any other concerns or complaints at this time.  Objective: Vitals:   07/08/22 0425 07/08/22 1431 07/08/22 2102 07/09/22 0525  BP: (!) 150/90 (!) 180/95 (!) 154/71 (!) 174/87  Pulse:  70 70 60  Resp:  '20 18 18  '$ Temp:  99.1 F (37.3 C) 98.4 F (36.9 C) 97.9 F (36.6 C)  TempSrc:  Oral Oral Oral  SpO2:  97% 97% 97%  Weight:      Height:        Intake/Output Summary (Last 24 hours) at 07/09/2022 1339 Last data filed at 07/09/2022 1000 Gross per 24 hour  Intake 2338.82 ml  Output 2280 ml  Net 58.82 ml   Filed Weights   07/06/22 1354 07/07/22 0500 07/08/22 0413  Weight: 91 kg 95 kg 95.7 kg    Examination: Physical Exam:  Constitutional: WN/WD obese elderly Caucasian male currently no acute distress in a chair bedside  Respiratory: Clear to auscultation bilaterally, no wheezing, rales, rhonchi or crackles. Normal respiratory effort and patient is not tachypenic. No accessory muscle use.  Cardiovascular: RRR, no murmurs / rubs / gallops. S1 and S2 auscultated. No extremity edema.  Abdomen: Soft, non-tender, distended second body habitus and has a drain in place. Bowel sounds positive.  GU: Deferred. Musculoskeletal: No clubbing / cyanosis of digits/nails. No joint deformity upper and lower extremities.  Skin: No rashes, lesions, ulcers on limited skin evaluation. No induration; Warm and dry.  Neurologic: CN 2-12 grossly intact with no focal deficits. Romberg sign and cerebellar reflexes not assessed.  Psychiatric: Normal judgment and insight. Alert and oriented x 3. Normal mood and appropriate affect.   Data Reviewed: I have personally reviewed following labs and imaging studies  CBC: Recent Labs  Lab 07/05/22 1049 07/06/22 0329 07/07/22 0033 07/08/22 1024 07/09/22 0447  WBC 9.3 11.7* 10.4 10.1  9.0  NEUTROABS 8.0*  --   --  8.9* 6.9  HGB 11.5* 10.9* 10.9* 11.4* 11.7*  HCT 34.2* 31.8* 33.9* 33.3* 33.8*  MCV 95.8 96.7 101.8* 95.1 94.2  PLT 135* 116* 107* 126* 270   Basic Metabolic Panel: Recent Labs  Lab 07/05/22 1049 07/06/22 0329 07/07/22 0033 07/08/22 0453 07/09/22 0447  NA 141 141 139 142 140  K 3.7 4.4 4.3 4.1 4.1  CL 109 108 109 110 108  CO2 '26 25 24 25 26  '$ GLUCOSE 117* 198* 141* 130* 123*  BUN 26* 30* 25* 19 17  CREATININE 1.49* 1.72* 1.50* 1.20 1.40*  CALCIUM 8.1* 7.9* 8.1* 8.7* 8.7*  MG  --   --  2.1 2.0 2.0  PHOS  --   --  2.5 2.4* 2.3*   GFR: Estimated Creatinine Clearance: 47.2 mL/min (A) (by C-G formula based on SCr of 1.4 mg/dL (H)). Liver Function Tests: Recent Labs  Lab 07/05/22 1049 07/08/22 0453 07/09/22 0447  AST 15 39 29  ALT  12 180* 128*  ALKPHOS 30* 51 50  BILITOT 0.6 0.6 0.8  PROT 5.1* 5.6* 5.9*  ALBUMIN 3.2* 3.0* 3.2*   Recent Labs  Lab 07/05/22 1049  LIPASE 32   No results for input(s): "AMMONIA" in the last 168 hours. Coagulation Profile: Recent Labs  Lab 07/05/22 1232  INR 1.2   Cardiac Enzymes: No results for input(s): "CKTOTAL", "CKMB", "CKMBINDEX", "TROPONINI" in the last 168 hours. BNP (last 3 results) No results for input(s): "PROBNP" in the last 8760 hours. HbA1C: Recent Labs    07/08/22 0453  HGBA1C 5.3   CBG: Recent Labs  Lab 07/08/22 1117 07/08/22 1624 07/09/22 0013 07/09/22 0724 07/09/22 1137  GLUCAP 140* 175* 119* 125* 141*   Lipid Profile: No results for input(s): "CHOL", "HDL", "LDLCALC", "TRIG", "CHOLHDL", "LDLDIRECT" in the last 72 hours. Thyroid Function Tests: No results for input(s): "TSH", "T4TOTAL", "FREET4", "T3FREE", "THYROIDAB" in the last 72 hours. Anemia Panel: Recent Labs    07/09/22 0447  VITAMINB12 1,111*  FOLATE 8.7  FERRITIN 257  TIBC 209*  IRON 62  RETICCTPCT 1.6   Sepsis Labs: Recent Labs  Lab 07/05/22 1232 07/05/22 1900  LATICACIDVEN 2.8* 1.2    Recent Results (from the past 240 hour(s))  Resp Panel by RT-PCR (Flu A&B, Covid)     Status: None   Collection Time: 07/05/22 12:32 PM   Specimen: Nasal Swab  Result Value Ref Range Status   SARS Coronavirus 2 by RT PCR NEGATIVE NEGATIVE Final    Comment: (NOTE) SARS-CoV-2 target nucleic acids are NOT DETECTED.  The SARS-CoV-2 RNA is generally detectable in upper respiratory specimens during the acute phase of infection. The lowest concentration of SARS-CoV-2 viral copies this assay can detect is 138 copies/mL. A negative result does not preclude SARS-Cov-2 infection and should not be used as the sole basis for treatment or other patient management decisions. A negative result may occur with  improper specimen collection/handling, submission of specimen other than nasopharyngeal  swab, presence of viral mutation(s) within the areas targeted by this assay, and inadequate number of viral copies(<138 copies/mL). A negative result must be combined with clinical observations, patient history, and epidemiological information. The expected result is Negative.  Fact Sheet for Patients:  EntrepreneurPulse.com.au  Fact Sheet for Healthcare Providers:  IncredibleEmployment.be  This test is no t yet approved or cleared by the [place] FDA and  has been authorized for detection and/or diagnosis of SARS-CoV-2 by FDA under an Emergency  Use Authorization (EUA). This EUA will remain  in effect (meaning this test can be used) for the duration of the COVID-19 declaration under Section 564(b)(1) of the Act, 21 U.S.C.section 360bbb-3(b)(1), unless the authorization is terminated  or revoked sooner.       Influenza A by PCR NEGATIVE NEGATIVE Final   Influenza B by PCR NEGATIVE NEGATIVE Final    Comment: (NOTE) The Xpert Xpress SARS-CoV-2/FLU/RSV plus assay is intended as an aid in the diagnosis of influenza from Nasopharyngeal swab specimens and should not be used as a sole basis for treatment. Nasal washings and aspirates are unacceptable for Xpert Xpress SARS-CoV-2/FLU/RSV testing.  Fact Sheet for Patients: EntrepreneurPulse.com.au  Fact Sheet for Healthcare Providers: IncredibleEmployment.be  This test is not yet approved or cleared by the [place] FDA and has been authorized for detection and/or diagnosis of SARS-CoV-2 by FDA under an Emergency Use Authorization (EUA). This EUA will remain in effect (meaning this test can be used) for the duration of the COVID-19 declaration under Section 564(b)(1) of the Act, 21 U.S.C. section 360bbb-3(b)(1), unless the authorization is terminated or revoked.  Performed at [facility], [place] [address] [place], [place] [address]    Blood Culture (routine x 2)     Status: None   Collection Time: 07/05/22 12:32 PM   Specimen: BLOOD  Result Value Ref Range Status   Specimen Description   Final    BLOOD LEFT ANTECUBITAL Performed at [facility] [address] [place], [place] [address]    Special Requests   Final    BOTTLES DRAWN AEROBIC AND ANAEROBIC Blood Culture adequate volume Performed at [facility] [address], [place], [place] [address]    Culture  Setup Time   Final    GRAM POSITIVE RODS AEROBIC BOTTLE ONLY CRITICAL RESULT CALLED TO, READ BACK BY AND VERIFIED WITH: E JACKSON,PHARMD'@0200'$  07/07/22 [place]    Culture   Final    TURICELLA OTITIDIS Standardized susceptibility testing for this organism is not available. Performed at [facility], [place] [address] [place], [place] [address]    Report Status 07/09/2022 FINAL  Final  Blood Culture (routine x 2)     Status: None (Preliminary result)   Collection Time: 07/06/22  3:29 AM   Specimen: BLOOD  Result Value Ref Range Status   Specimen Description   Final    BLOOD BLOOD RIGHT HAND Performed at [facility] [address] [place], [place] [address]    Special Requests   Final    BOTTLES DRAWN AEROBIC ONLY Blood Culture adequate volume Performed at [facility] [address], [place], [place] [address]    Culture   Final    NO GROWTH 3 DAYS Performed at [facility], [place] [address], [place], [place] [address]    Report Status PENDING  Incomplete  Urine Culture     Status: None   Collection Time: 07/06/22  2:00 PM   Specimen: Urine, Clean Catch  Result Value Ref Range Status   Specimen Description   Final    URINE, CLEAN CATCH Performed at [facility], [place] [address], [place], [place] [address]    Special Requests   Final    NONE Performed at [facility], [place] [address] [place], [place] [address]    Culture    Final    NO GROWTH Performed at [facility], [place] [address]  [place], [place] [address]    Report Status 07/07/2022 FINAL  Final     Radiology Studies: NM HEPATOBILIARY LEAK (POST-SURGICAL)  Result Date: 07/07/2022 CLINICAL DATA:  Subtotal cholecystectomy performed. Postop day 1. Surgical drain in place. Concern for biliary leak EXAM: NUCLEAR MEDICINE HEPATOBILIARY IMAGING TECHNIQUE: Sequential images of the abdomen were obtained out to 60 minutes following intravenous administration of radiopharmaceutical. RADIOPHARMACEUTICALS:  5.3 mCi Tc-28m Choletec IV COMPARISON:  Ultrasound 07/05/2022 FINDINGS: Prompt clearance of radiotracer from blood pool and homogeneous uptake in liver. Counts are evident in the common bile duct and small bowel by 20 minutes. At 15 minutes, counts begin to collect along the inferior margin of the RIGHT hepatic lobe. These counts spill into the peritoneal space along the RIGHT pericolic gutter consistent with biliary leak. IMPRESSION: Biliary leak with biliary excreted radiotracer collecting in the gallbladder fossa and spilling into the RIGHT pericolic gutter. Patent common bile duct. These results will be called to the ordering clinician or representative by the Radiologist Assistant, and communication documented in the PACS or [employer] Electronically Signed   By: SSuzy BouchardM.D.   On: 07/07/2022 16:56    Scheduled Meds:  acetaminophen  1,000 mg Oral TID   aspirin EC  81 mg Oral Daily   atorvastatin  40 mg Oral Daily   docusate sodium  100 mg Oral BID   escitalopram  20 mg Oral Daily   insulin aspart  0-15 Units Subcutaneous TID WC   insulin aspart  0-5 Units Subcutaneous QHS   lip balm   Topical BID   metoprolol succinate  25 mg Oral BID   pantoprazole  20 mg Oral Daily   polycarbophil  625 mg Oral BID   tamsulosin  0.4 mg Oral Daily   Continuous Infusions:  ceFEPime (MAXIPIME) IV Stopped (07/09/22 0931)   lactated ringers 75 mL/hr at  07/09/22 0000   methocarbamol (ROBAXIN) IV     metronidazole 500 mg (07/09/22 1214)    LOS: 2 days   ORaiford Noble DO [facility] Available via Epic secure chat 7am-7pm After these hours, please refer to coverage provider listed on [email or web address] 07/09/2022, 1:39 PM

## 2022-07-09 NOTE — Progress Notes (Signed)
Mobility Specialist Cancellation/Refusal Note:    07/09/22 0949  Mobility  Activity Refused mobility     Reason for Cancellation/Refusal: Pt declined mobility at this time. Pt just got up w/ PT. Will check back as schedule permits.     [facility]

## 2022-07-09 NOTE — Op Note (Signed)
[facility] Patient Name: Francisco [name] Procedure Date: 07/09/2022 MRN: [id number] Attending MD: Ladene Artist , MD Date of Birth: 1941-07-02 CSN: [id number] Age: 81 Admit Type: Inpatient Procedure:                ERCP Indications:              Bile leak Providers:                Pricilla Riffle. Fuller Plan, MD, Jaci Carrel, RN, Cletis Athens, Technician Referring MD:             Blanchard Mane. Zenia Resides, MD Medicines:                General Anesthesia Complications:            No immediate complications. Estimated Blood Loss:     Estimated blood loss: none. Procedure:                Pre-Anesthesia Assessment:                           - Prior to the procedure, a History and Physical                            was performed, and patient medications and                            allergies were reviewed. The patient's tolerance of                            previous anesthesia was also reviewed. The risks                            and benefits of the procedure and the sedation                            options and risks were discussed with the patient.                            All questions were answered, and informed consent                            was obtained. Prior Anticoagulants: The patient has                            taken no previous anticoagulant or antiplatelet                            agents. ASA Grade Assessment: III - A patient with                            severe systemic disease. After reviewing the risks  and benefits, the patient was deemed in                            satisfactory condition to undergo the procedure.                           After obtaining informed consent, the scope was                            passed under direct vision. Throughout the                            procedure, the patient's blood pressure, pulse, and                            oxygen saturations were monitored  continuously. The                            [id number]-[id number] ([id number]) Olympus duodenoscope was                            introduced through the mouth, and used to inject                            contrast into and used to inject contrast into the                            bile duct. The ERCP was accomplished without                            difficulty. The patient tolerated the procedure                            well. Scope In: Scope Out: Findings:      The scout film showed a RUQ drain and was otherwise normal. The scope       was advanced to a normal major papilla with a hooded fold in the       descending duodenum. Limited examination of the pharynx, larynx and       associated structures, and upper GI tract showed a mild distal       esophageal stricture and otherwise appeared normal. A straight       Roadrunner wire was passed into the biliary tree. The short-nosed       traction sphincterotome was passed over the guidewire and the bile duct       was then deeply cannulated. Contrast was injected. I personally       interpreted the bile duct images. There was appropriate flow of contrast       through the ducts. Extravasation of contrast originating from the       gallbladder remnant was observed. A subtotal cholecystectomy had been       performed. The biliary tree otherwise appeared normal. The intrahepatic       ducts were not fully filled with contrast flowing to the leak. A 5 mm       biliary sphincterotomy was made with  a traction (standard)       sphincterotome using ERBE electrocautery to facilitate stent placement.       There was no post-sphincterotomy bleeding. One 10 Fr by 5 cm plastic       stent with a single external flap and a single internal flap was placed       4 cm into the common bile duct. Bile flowed through the stent. The stent       was in good position. The PD was not cannulated or injected by intention. Impression:               - A bile leak was  found.                           - Prior subtotal cholecystectomy.                           - A biliary sphincterotomy was performed.                           - One plastic stent was placed into the common bile                            duct.                           - Non obstructing distal esophageal stricture. Moderate Sedation:      Not Applicable - Patient had care per Anesthesia. Recommendation:           - Return patient to hospital ward for ongoing care.                           - Avoid aspirin and nonsteroidal anti-inflammatory                            medicines for 1 week.                           - Observe patient's clinical course following                            today's ERCP with therapeutic intervention.                           - ERCP with biliary stent removal in 3 months with                            Dr. Henrene Pastor. Procedure Code(s):        --- Professional ---                           [phone], Endoscopic retrograde                            cholangiopancreatography (ERCP); with placement of  endoscopic stent into biliary or pancreatic duct,                            including pre- and post-dilation and guide wire                            passage, when performed, including sphincterotomy,                            when performed, each stent Diagnosis Code(s):        --- Professional ---                           K83.9, Disease of biliary tract, unspecified                           Z90.49, Acquired absence of other specified parts                            of digestive tract                           K83.8, Other specified diseases of biliary tract CPT copyright 2019 American Medical Association. All rights reserved. The codes documented in this report are preliminary and upon coder review may  be revised to meet current compliance requirements. Ladene Artist, MD 07/09/2022 4:23:06 PM This report has been signed  electronically. Number of Addenda: 0

## 2022-07-10 ENCOUNTER — Inpatient Hospital Stay (HOSPITAL_COMMUNITY): Payer: Medicare Other

## 2022-07-10 DIAGNOSIS — N1831 Chronic kidney disease, stage 3a: Secondary | ICD-10-CM | POA: Diagnosis not present

## 2022-07-10 DIAGNOSIS — K8000 Calculus of gallbladder with acute cholecystitis without obstruction: Secondary | ICD-10-CM | POA: Diagnosis not present

## 2022-07-10 DIAGNOSIS — R7989 Other specified abnormal findings of blood chemistry: Secondary | ICD-10-CM | POA: Diagnosis not present

## 2022-07-10 DIAGNOSIS — K812 Acute cholecystitis with chronic cholecystitis: Secondary | ICD-10-CM | POA: Diagnosis not present

## 2022-07-10 DIAGNOSIS — K9189 Other postprocedural complications and disorders of digestive system: Secondary | ICD-10-CM | POA: Diagnosis not present

## 2022-07-10 DIAGNOSIS — K838 Other specified diseases of biliary tract: Secondary | ICD-10-CM | POA: Diagnosis not present

## 2022-07-10 LAB — MAGNESIUM: Magnesium: 2.2 mg/dL (ref 1.7–2.4)

## 2022-07-10 LAB — CBC WITH DIFFERENTIAL/PLATELET
Abs Immature Granulocytes: 0.03 10*3/uL (ref 0.00–0.07)
Basophils Absolute: 0 10*3/uL (ref 0.0–0.1)
Basophils Relative: 0 %
Eosinophils Absolute: 0.1 10*3/uL (ref 0.0–0.5)
Eosinophils Relative: 1 %
HCT: 32.6 % — ABNORMAL LOW (ref 39.0–52.0)
Hemoglobin: 11.2 g/dL — ABNORMAL LOW (ref 13.0–17.0)
Immature Granulocytes: 0 %
Lymphocytes Relative: 11 %
Lymphs Abs: 0.9 10*3/uL (ref 0.7–4.0)
MCH: 32.1 pg (ref 26.0–34.0)
MCHC: 34.4 g/dL (ref 30.0–36.0)
MCV: 93.4 fL (ref 80.0–100.0)
Monocytes Absolute: 0.6 10*3/uL (ref 0.1–1.0)
Monocytes Relative: 8 %
Neutro Abs: 6.8 10*3/uL (ref 1.7–7.7)
Neutrophils Relative %: 80 %
Platelets: 163 10*3/uL (ref 150–400)
RBC: 3.49 MIL/uL — ABNORMAL LOW (ref 4.22–5.81)
RDW: 13.4 % (ref 11.5–15.5)
WBC: 8.4 10*3/uL (ref 4.0–10.5)
nRBC: 0 % (ref 0.0–0.2)

## 2022-07-10 LAB — COMPREHENSIVE METABOLIC PANEL
ALT: 102 U/L — ABNORMAL HIGH (ref 0–44)
AST: 43 U/L — ABNORMAL HIGH (ref 15–41)
Albumin: 3 g/dL — ABNORMAL LOW (ref 3.5–5.0)
Alkaline Phosphatase: 49 U/L (ref 38–126)
Anion gap: 7 (ref 5–15)
BUN: 21 mg/dL (ref 8–23)
CO2: 26 mmol/L (ref 22–32)
Calcium: 8.5 mg/dL — ABNORMAL LOW (ref 8.9–10.3)
Chloride: 110 mmol/L (ref 98–111)
Creatinine, Ser: 1.34 mg/dL — ABNORMAL HIGH (ref 0.61–1.24)
GFR, Estimated: 53 mL/min — ABNORMAL LOW (ref >60)
Glucose, Bld: 125 mg/dL — ABNORMAL HIGH (ref 70–99)
Potassium: 3.8 mmol/L (ref 3.5–5.1)
Sodium: 143 mmol/L (ref 135–145)
Total Bilirubin: 0.9 mg/dL (ref 0.3–1.2)
Total Protein: 5.6 g/dL — ABNORMAL LOW (ref 6.5–8.1)

## 2022-07-10 LAB — LIPASE, BLOOD: Lipase: 32 U/L (ref 11–51)

## 2022-07-10 LAB — PHOSPHORUS: Phosphorus: 3.9 mg/dL (ref 2.5–4.6)

## 2022-07-10 LAB — GLUCOSE, CAPILLARY
Glucose-Capillary: 119 mg/dL — ABNORMAL HIGH (ref 70–99)
Glucose-Capillary: 132 mg/dL — ABNORMAL HIGH (ref 70–99)
Glucose-Capillary: 140 mg/dL — ABNORMAL HIGH (ref 70–99)
Glucose-Capillary: 123 mg/dL — ABNORMAL HIGH (ref 70–99)

## 2022-07-10 MED ORDER — BISACODYL 10 MG RE SUPP: 10.0000 mg | Freq: Once | RECTAL | Status: DC

## 2022-07-10 MED ORDER — POLYETHYLENE GLYCOL 3350 17 G PO PACK
17.0000 g | PACK | Freq: Two times a day (BID) | ORAL | Status: DC
  Administered 2022-07-10 – 2022-07-14 (×8): 17 g via ORAL
  Filled 2022-07-10 (×8): qty 1

## 2022-07-10 MED ORDER — ENOXAPARIN SODIUM 40 MG/0.4ML IJ SOSY
40.0000 mg | PREFILLED_SYRINGE | INTRAMUSCULAR | Status: DC
  Administered 2022-07-10 – 2022-07-13 (×4): 40 mg via SUBCUTANEOUS
  Filled 2022-07-10 (×4): qty 0.4

## 2022-07-10 MED ORDER — HYDRALAZINE HCL 20 MG/ML IJ SOLN
10.0000 mg | Freq: Four times a day (QID) | INTRAMUSCULAR | Status: DC | PRN
Start: 2022-07-10 — End: 2022-07-14
  Administered 2022-07-12: 10 mg via INTRAVENOUS
  Filled 2022-07-10: qty 1

## 2022-07-10 MED ORDER — ONDANSETRON 4 MG PO TBDP
4.0000 mg | ORAL_TABLET | Freq: Two times a day (BID) | ORAL | Status: DC
  Administered 2022-07-10 – 2022-07-14 (×8): 4 mg via ORAL
  Filled 2022-07-10 (×8): qty 1

## 2022-07-10 MED ORDER — ONDANSETRON HCL 4 MG/2ML IJ SOLN
4.0000 mg | Freq: Two times a day (BID) | INTRAMUSCULAR | Status: DC
  Administered 2022-07-12: 4 mg via INTRAVENOUS
  Filled 2022-07-10: qty 2

## 2022-07-10 NOTE — Progress Notes (Signed)
PROGRESS NOTE    Francisco [name]  SAY:[id number] DOB: [birth year] DOA: 07/05/2022 PCP: [name], FNP   Brief Narrative:  Brief Narrative:  The patient is an 81 year old Caucasian male with past medical history significant for but limited to hypertension, hyperlipidemia, GERD, chronic kidney disease stage IIIa, as well as other comorbidities who was admitted to the hospital under surgical service for acute on chronic cholecystitis and underwent cholecystectomy.  Postoperatively patient ended up having a bile leak and hospitalist service was consulted to help address his medical issues.  He has been consulted for his subsequent bile leak and he has a percutaneous biliary drain in place.  He continues to have significant discomfort and is on IV cefepime and Flagyl and the plan is for ERCP and patient underwent biliary stenting with a plastic stent to help seal the bile leak.  Today he continues to have some abdominal discomfort and increased nausea as well as some slight shortness of breath.  Gastroenterology is recommending scheduled Zofran and Compazine as needed and they are going to advance his diet to full liquid diet and advance as tolerated.  There are planning ERCP biliary stent removal within 3 months. SLP evaluated and he was a mild aspiration risk.  Assessment and Plan:  Acute on chronic cholecystitis with complication of a bile leak Abnormal LFTs Abdominal Pain -Status post cholecystectomy with drain placement: General surgery recommending continuing drain today -Currently on IV antibiotics with cefepime and metronidazole which we will continue but neurosurgery stopping today -Patient underwent a nuclear medicine HIDA scan which was concerning for a leak and general surgery is not a surprise given his severe cholecystitis and difficult subtotal cholecystectomy and have asked GI to evaluate for ERCP and stenting and plan is for ERCP with likely biliary stent and decompress the  bile duct to help the bile leak still.  Patient is status post plastic stenting of his biliary duct and is postoperative day 1 and he continues to have some abdominal discomfort and nausea -GI is following and recommending a full liquid diet and advancing as tolerated and have scheduled Zofran and added as needed Compazine -Lipase is being checked -AST went from 39 -> 29 -> 43 -ALT went from 180 -> 128 -> 102 -Plan is for ERCP today as he is NPO and Lovenox held for ERCP; after his ERCP they are recommending a solid diet -Pathology has been benign from his surgery -Postoperative care per general surgery and they are recommending continuing the drain for now -PT/OT recommending Home Health  -GI recommending repeating ERCP as an outpatient with Dr. Henrene Pastor in 3 months with cholangiogram and stent pull -GI has recommended bowel regimen and have signed off the case now -Continue to monitor carefully   Dyspnea with cough -States this happened post ERCP and chest x-ray done and showed no acute cardiopulmonary disease -Continue with incentive spirometry and monitoring -SLP shows that he is a minimal aspiration risk -Recommend ambulatory home O2 screen prior to discharge  Chronic Kidney Disease Stage IIIa -Baseline creatinine approximately 1.5 -Creatinine appears to be near baseline and now BUNs/creatinine has gone from 19/1.20 -> 17/1.40 and is now 21/1.34 -Continue to monitor -Appears that he is chronically on meloxicam as an outpatient.  Would not resume this on discharge -Outpatient Lasix dose currently on hold.  Resume once he has adequate p.o. intake -Avoid further nephrotoxic medications, contrast dyes, hypotension and dehydration to ensure adequate renal perfusion if possible and renally dose medications -Repeat CMP in a.m.  Hyperlipidemia -Continued statin but may need to hold given his abnormal LFTs as AST is now 43 and ALT is 102 -Continue monitor and trend and repeat CMP in the a.m.  and if necessary obtain a right upper quadrant ultrasound and acute hepatitis panel   Hypertension -Continued on Toprol -Blood pressure currently stable we will need to monitor blood pressures carefully -Last BP reading was elevated at 191/84 -We will add IV hydralazine 10 mg every 6 as needed for SBP greater than 846 or diastolic blood pressure greater 100   GERD/GI prophylaxis -Continue PPI   Constipation -GI and general surgery following and he is on fiber and MiraLAX   BPH -Continued on Tamsulosin    Hyperglycemia -CBGs ranging from 111-141 -Check A1c and was not elevated and was 5.3   Hypophosphatemia -Patient's Phos level is now gone from 2.3-3.9 -Replete with p.o. K Phos neutral 500 mg x 1 yesterday -Continue monitor and trend and repeat Phos level in a.m.   Hypoalbuminemia -Patient's albumin level is 3.0 -> 3.2 -> 3.0 -Continue monitor and trend and repeat CMP in a.m.   Normocytic anemia -Patient's hemoglobin/hematocrit went from 10.9/33.9 with a MCV of 101.8 and is now 11.2/32.6 with an MCV of 93.4 -Checked Anemia panel and showed an iron level of 62, U IBC 147, TIBC 209, saturation ratio 30%, ferritin level 257, folate of 8.7, vitamin B12 1111 -Continue monitor for signs and symptoms bleeding; no overt bleeding noted -Repeat CBC in a.m.   Thrombocytopenia - Improved as Patient's platelet count has gone from 107 -> 126 -> 160 and is now 163 -Continue monitor and trend and evaluate for signs and symptoms of bleeding and repeat CBC in a.m.  DVT prophylaxis: SCD's Start: 07/05/22 1507    Code Status: Full Code Family Communication: No family currently at bedside  Disposition Plan:  Level of care: Med-Surg Status is: Inpatient Remains inpatient appropriate because: Pending further surgery and GI clearance   Consultants:  General surgery Gastroenterology [facility]  Procedures:  Procedure: Laparoscopic subtotal fenestrating cholecystectomy with drain  placement  ERCP Findings:      The scout film showed a RUQ drain and was otherwise normal. The scope       was advanced to a normal major papilla with a hooded fold in the       descending duodenum. Limited examination of the pharynx, larynx and       associated structures, and upper GI tract showed a mild distal       esophageal stricture and otherwise appeared normal. A straight       Roadrunner wire was passed into the biliary tree. The short-nosed       traction sphincterotome was passed over the guidewire and the bile duct       was then deeply cannulated. Contrast was injected. I personally       interpreted the bile duct images. There was appropriate flow of contrast       through the ducts. Extravasation of contrast originating from the       gallbladder remnant was observed. A subtotal cholecystectomy had been       performed. The biliary tree otherwise appeared normal. The intrahepatic       ducts were not fully filled with contrast flowing to the leak. A 5 mm       biliary sphincterotomy was made with a traction (standard)       sphincterotome using ERBE electrocautery to facilitate stent placement.  There was no post-sphincterotomy bleeding. One 10 Fr by 5 cm plastic       stent with a single external flap and a single internal flap was placed       4 cm into the common bile duct. Bile flowed through the stent. The stent       was in good position. The PD was not cannulated or injected by intention. Impression:               - A bile leak was found.                           - Prior subtotal cholecystectomy.                           - A biliary sphincterotomy was performed.                           - One plastic stent was placed into the common bile                            duct.                           - Non obstructing distal esophageal stricture. Moderate Sedation:      Not Applicable - Patient had care per Anesthesia. Recommendation:           - Return  patient to hospital ward for ongoing care.                           - Avoid aspirin and nonsteroidal anti-inflammatory                            medicines for 1 week.                           - Observe patient's clinical course following                            today's ERCP with therapeutic intervention.                           - ERCP with biliary stent removal in 3 months with                            Dr. Henrene Pastor.  Antimicrobials:  Anti-infectives (From admission, onward)    Start     Dose/Rate Route Frequency Ordered Stop   07/06/22 1245  ceFEPIme (MAXIPIME) 2 g in sodium chloride 0.9 % 100 mL IVPB        2 g 200 mL/hr over 30 Minutes Intravenous Every 12 hours 07/06/22 1158     07/05/22 1215  ceFEPIme (MAXIPIME) 2 g in sodium chloride 0.9 % 100 mL IVPB        2 g 200 mL/hr over 30 Minutes Intravenous  Once 07/05/22 1201 07/05/22 1313   07/05/22 1215  metroNIDAZOLE (FLAGYL) IVPB 500 mg        500 mg 100 mL/hr over 60 Minutes Intravenous Every  12 hours 07/05/22 1203         Subjective: Seen and examined at bedside and he was having some abdominal discomfort and nausea.  States that he is coughing up some phlegm after his ERCP.  Did not feel as well.  States it was too hot in the room.  He denies chest pain.  No other concerns or complaints at this time.  Objective: Vitals:   07/10/22 0435 07/10/22 0500 07/10/22 0546 07/10/22 1127  BP: (!) 188/90  (!) 158/88 (!) 191/84  Pulse: 78  70 66  Resp: 18   18  Temp: 98.7 F (37.1 C)   98.7 F (37.1 C)  TempSrc: Oral   Oral  SpO2: 97%   97%  Weight:  94.2 kg    Height:        Intake/Output Summary (Last 24 hours) at 07/10/2022 1339 Last data filed at 07/10/2022 1122 Gross per 24 hour  Intake 1602.32 ml  Output 1335 ml  Net 267.32 ml   Filed Weights   07/08/22 0413 07/09/22 1418 07/10/22 0500  Weight: 95.7 kg 95.7 kg 94.2 kg   Examination: Physical Exam:  Constitutional: WN/WD obese elderly Caucasian male currently  no acute distress Respiratory: Diminished to auscultation bilaterally, no wheezing, rales, rhonchi or crackles. Normal respiratory effort and patient is not tachypenic. No accessory muscle use.  Unlabored breathing Cardiovascular: RRR, no murmurs / rubs / gallops. S1 and S2 auscultated. No extremity edema. Abdomen: Soft, it is somewhat tender and distended secondary body habitus and has a drain in place.  Bowel sounds positive.  GU: Deferred. Musculoskeletal: No clubbing / cyanosis of digits/nails. No joint deformity upper and lower extremities.  Skin: No rashes, lesions, ulcers on limited skin evaluation. No induration; Warm and dry.  Neurologic: CN 2-12 grossly intact with no focal deficits. Romberg sign and cerebellar reflexes not assessed.  Psychiatric: Normal judgment and insight. Alert and oriented x 3. Normal mood and appropriate affect.   Data Reviewed: I have personally reviewed following labs and imaging studies  CBC: Recent Labs  Lab 07/05/22 1049 07/06/22 0329 07/07/22 0033 07/08/22 1024 07/09/22 0447 07/10/22 0445  WBC 9.3 11.7* 10.4 10.1 9.0 8.4  NEUTROABS 8.0*  --   --  8.9* 6.9 6.8  HGB 11.5* 10.9* 10.9* 11.4* 11.7* 11.2*  HCT 34.2* 31.8* 33.9* 33.3* 33.8* 32.6*  MCV 95.8 96.7 101.8* 95.1 94.2 93.4  PLT 135* 116* 107* 126* 160 119   Basic Metabolic Panel: Recent Labs  Lab 07/06/22 0329 07/07/22 0033 07/08/22 0453 07/09/22 0447 07/10/22 0445  NA 141 139 142 140 143  K 4.4 4.3 4.1 4.1 3.8  CL 108 109 110 108 110  CO2 '25 24 25 26 26  '$ GLUCOSE 198* 141* 130* 123* 125*  BUN 30* 25* '19 17 21  '$ CREATININE 1.72* 1.50* 1.20 1.40* 1.34*  CALCIUM 7.9* 8.1* 8.7* 8.7* 8.5*  MG  --  2.1 2.0 2.0 2.2  PHOS  --  2.5 2.4* 2.3* 3.9   GFR: Estimated Creatinine Clearance: 49 mL/min (A) (by C-G formula based on SCr of 1.34 mg/dL (H)). Liver Function Tests: Recent Labs  Lab 07/05/22 1049 07/08/22 0453 07/09/22 0447 07/10/22 0445  AST 15 39 29 43*  ALT 12 180* 128* 102*   ALKPHOS 30* 51 50 49  BILITOT 0.6 0.6 0.8 0.9  PROT 5.1* 5.6* 5.9* 5.6*  ALBUMIN 3.2* 3.0* 3.2* 3.0*   Recent Labs  Lab 07/05/22 1049 07/10/22 0445  LIPASE 32 32   No  results for input(s): "AMMONIA" in the last 168 hours. Coagulation Profile: Recent Labs  Lab 07/05/22 1232  INR 1.2   Cardiac Enzymes: No results for input(s): "CKTOTAL", "CKMB", "CKMBINDEX", "TROPONINI" in the last 168 hours. BNP (last 3 results) No results for input(s): "PROBNP" in the last 8760 hours. HbA1C: Recent Labs    07/08/22 0453  HGBA1C 5.3   CBG: Recent Labs  Lab 07/09/22 1419 07/09/22 1726 07/09/22 2042 07/10/22 0716 07/10/22 1122  GLUCAP 112* 125* 111* 119* 132*   Lipid Profile: No results for input(s): "CHOL", "HDL", "LDLCALC", "TRIG", "CHOLHDL", "LDLDIRECT" in the last 72 hours. Thyroid Function Tests: No results for input(s): "TSH", "T4TOTAL", "FREET4", "T3FREE", "THYROIDAB" in the last 72 hours. Anemia Panel: Recent Labs    07/09/22 0447  VITAMINB12 1,111*  FOLATE 8.7  FERRITIN 257  TIBC 209*  IRON 62  RETICCTPCT 1.6   Sepsis Labs: Recent Labs  Lab 07/05/22 1232 07/05/22 1900  LATICACIDVEN 2.8* 1.2    Recent Results (from the past 240 hour(s))  Resp Panel by RT-PCR (Flu A&B, Covid)     Status: None   Collection Time: 07/05/22 12:32 PM   Specimen: Nasal Swab  Result Value Ref Range Status   SARS Coronavirus 2 by RT PCR NEGATIVE NEGATIVE Final    Comment: (NOTE) SARS-CoV-2 target nucleic acids are NOT DETECTED.  The SARS-CoV-2 RNA is generally detectable in upper respiratory specimens during the acute phase of infection. The lowest concentration of SARS-CoV-2 viral copies this assay can detect is 138 copies/mL. A negative result does not preclude SARS-Cov-2 infection and should not be used as the sole basis for treatment or other patient management decisions. A negative result may occur with  improper specimen collection/handling, submission of specimen  other than nasopharyngeal swab, presence of viral mutation(s) within the areas targeted by this assay, and inadequate number of viral copies(<138 copies/mL). A negative result must be combined with clinical observations, patient history, and epidemiological information. The expected result is Negative.  Fact Sheet for Patients:  EntrepreneurPulse.com.au  Fact Sheet for Healthcare Providers:  IncredibleEmployment.be  This test is no t yet approved or cleared by the [place] FDA and  has been authorized for detection and/or diagnosis of SARS-CoV-2 by FDA under an Emergency Use Authorization (EUA). This EUA will remain  in effect (meaning this test can be used) for the duration of the COVID-19 declaration under Section 564(b)(1) of the Act, 21 U.S.C.section 360bbb-3(b)(1), unless the authorization is terminated  or revoked sooner.       Influenza A by PCR NEGATIVE NEGATIVE Final   Influenza B by PCR NEGATIVE NEGATIVE Final    Comment: (NOTE) The Xpert Xpress SARS-CoV-2/FLU/RSV plus assay is intended as an aid in the diagnosis of influenza from Nasopharyngeal swab specimens and should not be used as a sole basis for treatment. Nasal washings and aspirates are unacceptable for Xpert Xpress SARS-CoV-2/FLU/RSV testing.  Fact Sheet for Patients: EntrepreneurPulse.com.au  Fact Sheet for Healthcare Providers: IncredibleEmployment.be  This test is not yet approved or cleared by the [place] FDA and has been authorized for detection and/or diagnosis of SARS-CoV-2 by FDA under an Emergency Use Authorization (EUA). This EUA will remain in effect (meaning this test can be used) for the duration of the COVID-19 declaration under Section 564(b)(1) of the Act, 21 U.S.C. section 360bbb-3(b)(1), unless the authorization is terminated or revoked.  Performed at [facility], [place] [address], [place], [place] [address]   Blood Culture (routine x 2)  Status: None   Collection Time: 07/05/22 12:32 PM   Specimen: BLOOD  Result Value Ref Range Status   Specimen Description   Final    BLOOD LEFT ANTECUBITAL Performed at [facility] [address] [place], [place] [address]    Special Requests   Final    BOTTLES DRAWN AEROBIC AND ANAEROBIC Blood Culture adequate volume Performed at [facility] [address] [place], [place] [address]    Culture  Setup Time   Final    GRAM POSITIVE RODS AEROBIC BOTTLE ONLY CRITICAL RESULT CALLED TO, READ BACK BY AND VERIFIED WITH: E JACKSON,PHARMD'@0200'$  07/07/22 [place]    Culture   Final    TURICELLA OTITIDIS Standardized susceptibility testing for this organism is not available. Performed at [facility], [place] [address], [place], [place] [address]    Report Status 07/09/2022 FINAL  Final  Blood Culture (routine x 2)     Status: None (Preliminary result)   Collection Time: 07/06/22  3:29 AM   Specimen: BLOOD  Result Value Ref Range Status   Specimen Description   Final    BLOOD BLOOD RIGHT HAND Performed at [facility] [address] [place], [place] [address]    Special Requests   Final    BOTTLES DRAWN AEROBIC ONLY Blood Culture adequate volume Performed at [facility] [address], [place], [place] [address]    Culture   Final    NO GROWTH 4 DAYS Performed at [facility], [place] [address] [place], [place] [address]    Report Status PENDING  Incomplete  Urine Culture     Status: None   Collection Time: 07/06/22  2:00 PM   Specimen: Urine, Clean Catch  Result Value Ref Range Status   Specimen Description   Final    URINE, CLEAN CATCH Performed at [facility], [place] [address] [place], [place] [address]    Special Requests   Final    NONE Performed at [facility], [place] [address]  [place], [place] [address]    Culture   Final    NO GROWTH Performed at [facility], [place] [address], [place],  [address]    Report Status 07/07/2022 FINAL  Final     Radiology Studies: DG CHEST PORT 1 VIEW  Result Date: 07/10/2022 CLINICAL DATA:  10026; shortness of breath EXAM: PORTABLE CHEST 1 VIEW COMPARISON:  July 05, 2022 FINDINGS: The heart size and mediastinal contours are within normal limits. Stable atheromatous calcifications of the arch of the aorta. Both lungs are clear and stable. The visualized skeletal structures are unremarkable. Again seen are the ACDF changes of the lower cervical spine. IMPRESSION: No active cardiopulmonary disease. Electronically Signed   By: Frazier Richards M.D.   On: 07/10/2022 13:18   DG Abd 1 View  Result Date: 07/10/2022 CLINICAL DATA:  Nausea and abdominal pain EXAM: ABDOMEN - 1 VIEW COMPARISON:  2014 FINDINGS: Common bile duct stent is noted. Right upper quadrant drain. Bowel gas pattern is unremarkable. IMPRESSION: Unremarkable bowel gas pattern. Electronically Signed   By: Macy Mis M.D.   On: 07/10/2022 10:09   DG ERCP  Result Date: 07/09/2022 CLINICAL DATA:  ERCP following subtotal cholecystectomy and concern for bile leak EXAM: ERCP TECHNIQUE: Multiple spot images obtained with the fluoroscopic device and submitted for interpretation post-procedure. FLUOROSCOPY TIME: FLUOROSCOPY TIME 1 minute, 47 seconds (22 mGy) COMPARISON:  Right upper quadrant  abdominal ultrasound-07/05/2022 Nuclear medicine HIDA scan-07/09/2022 FINDINGS: Twelve spot intraoperative fluoroscopic images of the right upper abdominal quadrant during ERCP are provided for review. Initial image demonstrates an ERCP probe overlying the right upper abdominal quadrant. Cholecystectomy clips overlies expected location of the gallbladder fossa. A large bore surgical drainage catheter overlies the gallbladder fossa Subsequent images demonstrate selective cannulation and  opacification of the CBD. There is opacification of the residual cystic duct with extravasation of contrast adjacent to the end of the large bore surgical drainage catheter (representative image 5). Subsequent images demonstrate placement of an internal biliary stent though the superior landing zone of the stent is suboptimally visualized. IMPRESSION: 1. ERCP with findings suggestive of a biliary leak from the cystic duct remnant. 2. Post biliary stent placement though the superior landing zone of the stent is suboptimally visualized, not definitively across the origin of the cystic duct. Further evaluation with CT could be performed as indicated. These images were submitted for radiologic interpretation only. Please see the procedural report for the amount of contrast and the fluoroscopy time utilized. Electronically Signed   By: Sandi Mariscal M.D.   On: 07/09/2022 16:45     Scheduled Meds:  acetaminophen  1,000 mg Oral TID   aspirin EC  81 mg Oral Daily   atorvastatin  40 mg Oral Daily   bisacodyl  10 mg Rectal Once   docusate sodium  100 mg Oral BID   escitalopram  20 mg Oral Daily   insulin aspart  0-15 Units Subcutaneous TID WC   insulin aspart  0-5 Units Subcutaneous QHS   lip balm   Topical BID   metoprolol succinate  25 mg Oral BID   ondansetron  4 mg Oral Q12H   Or   ondansetron (ZOFRAN) IV  4 mg Intravenous Q12H   pantoprazole  20 mg Oral Daily   polycarbophil  625 mg Oral BID   polyethylene glycol  17 g Oral BID   tamsulosin  0.4 mg Oral Daily   Continuous Infusions:  ceFEPime (MAXIPIME) IV 2 g (07/10/22 1004)   lactated ringers Stopped (07/09/22 1627)   methocarbamol (ROBAXIN) IV     metronidazole 500 mg (07/10/22 1313)    LOS: 3 days   Raiford Noble, DO [facility] Available via Epic secure chat 7am-7pm After these hours, please refer to coverage provider listed on [email or web address] 07/10/2022, 1:39 PM

## 2022-07-10 NOTE — TOC Progression Note (Signed)
Transition of Care [facility]) - Progression Note   Patient Details  Name: Francisco [name] MRN: [id number] Date of Birth: 1941/10/25  Transition of Care [facility]) CM/SW [place], LCSW Phone Number: 07/10/2022, 1:13 PM  Clinical Narrative: HHPT orders have been placed; CSW updated Cindie with [facility]. CSW notified patient that Alvis Lemmings will provide HHPT after discharge.  Expected Discharge Plan: [place] Barriers to Discharge: Continued Medical Work up  Expected Discharge Plan and Services Expected Discharge Plan: [place] In-house Referral: Clinical Social Work Post Acute Care Choice: [place] arrangements for the past 2 months: Single Family Home           DME Arranged: N/A DME Agency: NA HH Arranged: PT HH Agency: [place] Date [place]: 07/09/22 Time [employer]: 1856 Representative spoke with at [place]: Cindie  Readmission Risk Interventions     No data to display

## 2022-07-10 NOTE — Progress Notes (Signed)
Mobility Specialist - Progress Note    07/10/22 1350  Mobility  HOB Elevated/Bed Position Self regulated  Activity Ambulated with assistance in hallway;Ambulated with assistance to bathroom  Range of Motion/Exercises Active  Level of Assistance Contact guard assist, steadying assist  Assistive Device Front wheel walker  Distance Ambulated (ft) 70 ft  Activity Response Tolerated well  Transport method Ambulatory  $Mobility charge 1 Mobility   Pt received in bed and agreeable to mobility. Assisted pt to restroom for a bowel movement. C/o abdomen pain they rated 8/10 during mobility. Prior to hallway ambulation, pt complained of dizziness; one seated rest break was taken prior to ambulation in the hallway. Pt was very anxious throughout ambulation. Pt to bed after session with all needs met.     [facility]

## 2022-07-10 NOTE — Progress Notes (Signed)
Progress Note  1 Day Post-Op  Subjective: Continues to have some postsurgical abdominal pain today but now with nausea and shortness of breath.  Has been tolerating liquids.  No cough.  Objective: Vital signs in last 24 hours: Temp:  [97.7 F (36.5 C)-99.3 F (37.4 C)] 98.7 F (37.1 C) (08/25 1127) Pulse Rate:  [66-83] 66 (08/25 1127) Resp:  [13-20] 18 (08/25 1127) BP: (125-225)/(54-92) 191/84 (08/25 1127) SpO2:  [95 %-100 %] 97 % (08/25 1127) Weight:  [94.2 kg-95.7 kg] 94.2 kg (08/25 0500) Last BM Date : 07/05/22  Intake/Output from previous day: 08/24 0701 - 08/25 0700 In: 2002.3 [P.O.:720; I.V.:960; IV Piggyback:322.3] Out: 1305 [Urine:1250; Drains:55] Intake/Output this shift: Total I/O In: -  Out: 350 [Urine:350]  PE: General: pleasant, WD, male who is laying in bed in NAD HEENT:  Mouth is pink and moist Lungs: CTAB, no wheezes, rhonchi, or rales noted.  Respiratory effort nonlabored.  Able to pull 1000 on IS Abd: soft, ND, +BS, mild expected postoperative tenderness over incisions which have dressings C/D/I MSK: all 4 extremities are symmetrical with no cyanosis, clubbing, or edema. Skin: warm and dry with no masses, lesions, or rashes Psych: A&Ox3 with an appropriate affect.    Lab Results:  Recent Labs    07/09/22 0447 07/10/22 0445  WBC 9.0 8.4  HGB 11.7* 11.2*  HCT 33.8* 32.6*  PLT 160 163   BMET Recent Labs    07/09/22 0447 07/10/22 0445  NA 140 143  K 4.1 3.8  CL 108 110  CO2 26 26  GLUCOSE 123* 125*  BUN 17 21  CREATININE 1.40* 1.34*  CALCIUM 8.7* 8.5*   PT/INR No results for input(s): "LABPROT", "INR" in the last 72 hours. CMP     Component Value Date/Time   NA 143 07/10/2022 0445   NA 141 11/21/2021 1225   K 3.8 07/10/2022 0445   CL 110 07/10/2022 0445   CO2 26 07/10/2022 0445   GLUCOSE 125 (H) 07/10/2022 0445   BUN 21 07/10/2022 0445   BUN 22 11/21/2021 1225   CREATININE 1.34 (H) 07/10/2022 0445   CREATININE 0.86  04/12/2013 1145   CALCIUM 8.5 (L) 07/10/2022 0445   PROT 5.6 (L) 07/10/2022 0445   PROT 6.2 11/21/2021 1225   ALBUMIN 3.0 (L) 07/10/2022 0445   ALBUMIN 4.4 11/21/2021 1225   AST 43 (H) 07/10/2022 0445   ALT 102 (H) 07/10/2022 0445   ALKPHOS 49 07/10/2022 0445   BILITOT 0.9 07/10/2022 0445   BILITOT 0.6 11/21/2021 1225   GFRNONAA 53 (L) 07/10/2022 0445   GFRNONAA 87 04/12/2013 1145   GFRAA 57 (L) 10/08/2020 1346   GFRAA >89 04/12/2013 1145   Lipase     Component Value Date/Time   LIPASE 32 07/10/2022 0445       Studies/Results: DG CHEST PORT 1 VIEW  Result Date: 07/10/2022 CLINICAL DATA:  10026; shortness of breath EXAM: PORTABLE CHEST 1 VIEW COMPARISON:  July 05, 2022 FINDINGS: The heart size and mediastinal contours are within normal limits. Stable atheromatous calcifications of the arch of the aorta. Both lungs are clear and stable. The visualized skeletal structures are unremarkable. Again seen are the ACDF changes of the lower cervical spine. IMPRESSION: No active cardiopulmonary disease. Electronically Signed   By: Frazier Richards M.D.   On: 07/10/2022 13:18   DG Abd 1 View  Result Date: 07/10/2022 CLINICAL DATA:  Nausea and abdominal pain EXAM: ABDOMEN - 1 VIEW COMPARISON:  2014 FINDINGS: Common bile duct  stent is noted. Right upper quadrant drain. Bowel gas pattern is unremarkable. IMPRESSION: Unremarkable bowel gas pattern. Electronically Signed   By: Macy Mis M.D.   On: 07/10/2022 10:09   DG ERCP  Result Date: 07/09/2022 CLINICAL DATA:  ERCP following subtotal cholecystectomy and concern for bile leak EXAM: ERCP TECHNIQUE: Multiple spot images obtained with the fluoroscopic device and submitted for interpretation post-procedure. FLUOROSCOPY TIME: FLUOROSCOPY TIME 1 minute, 47 seconds (22 mGy) COMPARISON:  Right upper quadrant abdominal ultrasound-07/05/2022 Nuclear medicine HIDA scan-07/09/2022 FINDINGS: Twelve spot intraoperative fluoroscopic images of the right  upper abdominal quadrant during ERCP are provided for review. Initial image demonstrates an ERCP probe overlying the right upper abdominal quadrant. Cholecystectomy clips overlies expected location of the gallbladder fossa. A large bore surgical drainage catheter overlies the gallbladder fossa Subsequent images demonstrate selective cannulation and opacification of the CBD. There is opacification of the residual cystic duct with extravasation of contrast adjacent to the end of the large bore surgical drainage catheter (representative image 5). Subsequent images demonstrate placement of an internal biliary stent though the superior landing zone of the stent is suboptimally visualized. IMPRESSION: 1. ERCP with findings suggestive of a biliary leak from the cystic duct remnant. 2. Post biliary stent placement though the superior landing zone of the stent is suboptimally visualized, not definitively across the origin of the cystic duct. Further evaluation with CT could be performed as indicated. These images were submitted for radiologic interpretation only. Please see the procedural report for the amount of contrast and the fluoroscopy time utilized. Electronically Signed   By: Sandi Mariscal M.D.   On: 07/09/2022 16:45    Anti-infectives: Anti-infectives (From admission, onward)    Start     Dose/Rate Route Frequency Ordered Stop   07/06/22 1245  ceFEPIme (MAXIPIME) 2 g in sodium chloride 0.9 % 100 mL IVPB        2 g 200 mL/hr over 30 Minutes Intravenous Every 12 hours 07/06/22 1158     07/05/22 1215  ceFEPIme (MAXIPIME) 2 g in sodium chloride 0.9 % 100 mL IVPB        2 g 200 mL/hr over 30 Minutes Intravenous  Once 07/05/22 1201 07/05/22 1313   07/05/22 1215  metroNIDAZOLE (FLAGYL) IVPB 500 mg        500 mg 100 mL/hr over 60 Minutes Intravenous Every 12 hours 07/05/22 1203          Assessment/Plan Acute on chronic cholecystitis POD 5 s/p laparoscopic subtotal fenestrated cholecystectomy with drain  placement Dr. Zenia Resides 07/05/2022 -HIDA positive for bile leak and underwent ERCP 8/24 by Dr. Fuller Plan -Continue drain for now -Continue antibiotics for 24 hours post ERCP -Having some abdominal pain and nausea advance to full's per GI.  Okay to continue to advance as tolerated -SLP evaluated -patient with mild aspiration risk but cleared for diet advancement per SLP -Shortness of breath and cough this a.m.  Having good effort with IS.  Chest x-ray obtained negative for cardiopulmonary disease -Multimodal pain control -Mobilize with PT/OT, recommending home health -POC following to arrange home health  FEN: Dysphagia 1 advance as tolerated dysphagia 3 ID: [email or web address]/[email or web address] to stop today VTE: Lovenox  GERD -Protonix Anemia-stable CKD  Hypertension -Home medications GAD -Home medications   LOS: 3 days   Winferd Humphrey, [facility] 07/10/2022, 1:36 PM Please see Amion for pager number during day hours 7:00am-4:30pm

## 2022-07-10 NOTE — Progress Notes (Addendum)
Attending physician's note   I have taken a history, reviewed the chart, and examined the patient. I performed a substantive portion of this encounter, including complete performance of at least one of the key components, in conjunction with the APP. I agree with the APP's note, impression, and recommendations with my edits.   ERCP yesterday with sphincterotomy and plastic CBD stent placement.  He reports pain overall improving this afternoon.  Abdominal x-ray earlier today essentially normal.  ALT downtrending, mild increase in AST, but in the setting of instrumentation, otherwise normal T. bili and ALP.  Lipase normal.  - Plan for repeat ERCP as outpatient with Dr. Henrene Pastor in 3 months with cholangiogram and stent pull - ABX per primary Medicine service and Surgical service - Bowel regimen as outlined - Inpatient GI service will sign off at this time.  Please do not hesitate to contact on-call GI with additional questions or concerns  Gerrit Heck, DO, FACG [phone] office          Progress Note  Primary GI: Dr. Henrene Pastor   Subjective  Chief Complaint:bile leak  Patient lying in bed, states has had nausea increasing shortness of breath with abdominal pain. He is asking when he is can be able to eat but he states uncertain if he would be able to. Denies vomiting, fever or chills. Patient is also had a bowel movement.    Objective   Vital signs in last 24 hours: Temp:  [97.7 F (36.5 C)-99.3 F (37.4 C)] 98.7 F (37.1 C) (08/25 0435) Pulse Rate:  [66-83] 70 (08/25 0546) Resp:  [13-20] 18 (08/25 0435) BP: (125-225)/(54-92) 158/88 (08/25 0546) SpO2:  [95 %-100 %] 97 % (08/25 0435) Weight:  [94.2 kg-95.7 kg] 94.2 kg (08/25 0500) Last BM Date : 07/05/22 Last BM recorded by nurses in past 5 days No data recorded  General:   male in no acute distress  Heart:  Regular rate and rhythm; no murmurs Pulm: Clear anteriorly; no wheezing Abdomen:  Soft, Obese AB, sluggish  bowel sounds. mild tenderness in the upper abdomen and LLQ quadrant. Without guarding and Without rebound, No organomegaly appreciated. Extremities:  with  trace edema. Neurologic:  Alert and  oriented x4;  No focal deficits.  Psych:  Cooperative. depressed mood and flat affect.  Intake/Output from previous day: 08/24 0701 - 08/25 0700 In: 2002.3 [P.O.:720; I.V.:960; IV Piggyback:322.3] Out: 1305 [Urine:1250; Drains:55] Intake/Output this shift: No intake/output data recorded.  Studies/Results: DG ERCP  Result Date: 07/09/2022 CLINICAL DATA:  ERCP following subtotal cholecystectomy and concern for bile leak EXAM: ERCP TECHNIQUE: Multiple spot images obtained with the fluoroscopic device and submitted for interpretation post-procedure. FLUOROSCOPY TIME: FLUOROSCOPY TIME 1 minute, 47 seconds (22 mGy) COMPARISON:  Right upper quadrant abdominal ultrasound-07/05/2022 Nuclear medicine HIDA scan-07/09/2022 FINDINGS: Twelve spot intraoperative fluoroscopic images of the right upper abdominal quadrant during ERCP are provided for review. Initial image demonstrates an ERCP probe overlying the right upper abdominal quadrant. Cholecystectomy clips overlies expected location of the gallbladder fossa. A large bore surgical drainage catheter overlies the gallbladder fossa Subsequent images demonstrate selective cannulation and opacification of the CBD. There is opacification of the residual cystic duct with extravasation of contrast adjacent to the end of the large bore surgical drainage catheter (representative image 5). Subsequent images demonstrate placement of an internal biliary stent though the superior landing zone of the stent is suboptimally visualized. IMPRESSION: 1. ERCP with findings suggestive of a biliary leak from the cystic duct remnant.  2. Post biliary stent placement though the superior landing zone of the stent is suboptimally visualized, not definitively across the origin of the cystic duct.  Further evaluation with CT could be performed as indicated. These images were submitted for radiologic interpretation only. Please see the procedural report for the amount of contrast and the fluoroscopy time utilized. Electronically Signed   By: Sandi Mariscal M.D.   On: 07/09/2022 16:45    Lab Results: Recent Labs    07/08/22 1024 07/09/22 0447 07/10/22 0445  WBC 10.1 9.0 8.4  HGB 11.4* 11.7* 11.2*  HCT 33.3* 33.8* 32.6*  PLT 126* 160 163   BMET Recent Labs    07/08/22 0453 07/09/22 0447 07/10/22 0445  NA 142 140 143  K 4.1 4.1 3.8  CL 110 108 110  CO2 '25 26 26  '$ GLUCOSE 130* 123* 125*  BUN '19 17 21  '$ CREATININE 1.20 1.40* 1.34*  CALCIUM 8.7* 8.7* 8.5*   LFT Recent Labs    07/08/22 0453 07/09/22 0447 07/10/22 0445  PROT 5.6*   < > 5.6*  ALBUMIN 3.0*   < > 3.0*  AST 39   < > 43*  ALT 180*   < > 102*  ALKPHOS 51   < > 49  BILITOT 0.6   < > 0.9  BILIDIR 0.2  --   --   IBILI 0.4  --   --    < > = values in this interval not displayed.   PT/INR No results for input(s): "LABPROT", "INR" in the last 72 hours.    Patient profile:   81 year old male with bile duct leak following subtotal cholecystectomy on 8/20   Impression/Plan:   Bile leak s/p ERCP 08/24 WBC 8.4 - no leukocytosis AST 43 ALT 102- down trending ALT, slight increase in AST can be expected after ERCP  Alkphos 49 TBili 0.9- normal On maxipime and flagyl status post ERCP 08/24 with Dr. Fuller Plan biliary sphincterotomy was performed, plastic stent placed CBD, non obstructing distal esophageal stricture Some nausea and worsening abdominal pain status post ERCP, improving LFTs, no leukocytosis, will add on lipase, continue supportive care. We will do scheduled Zofran, Compazine as needed We will do full liquid diet, advanced as tolerated. We will add on lipase due to nausea/AB pain, small elevation to be expected Will need to arrange 3 month ERCP biliary stent removal 3 months with Dr. Henrene Pastor  Anemia HGB  11.2  Normal iron/ferritin likely secondary CKD   Thrombocytopenia  Platelets 163 Improved, likely secondary inflammation/ABX   Chronic constipation Possibly contributing to nausea and abdominal pain Can add miralax, get KUB, Dulcolax suppository today.    LOS: 3 days   Vladimir Crofts  07/10/2022, 8:12 AM

## 2022-07-10 NOTE — Evaluation (Signed)
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  Pt found to have nonobstructing distal esopahgeal stricture. PMH + for asthma, HTN, PVD, GERD, anemia, constipation, left parietal hemorrhage, cervical spine imaging showed "Foraminal narrowing at C3-4, primarily on the right due to spurs.     2.  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  No acute abnormalities".  Swallow evaluation ordered. Pt reported abdomen pain to GI and is interetested in eating s/p ERCP. Most recent CXR negative.    Assessment / Plan / Recommendation  Clinical Impression  Pt without clinical indications of aspiration/penetration or dysphagia with all po observed including ice chips, water, jello and icecream. Swallow is timely and voice is clear - mildly hypophonic.  Did not see pt with solids given his full liquid diet restriction.  Pt admits to some issues with swallow prior to admit and states they are better today.  He does have h/o cervical spine fusion and admits to dental pain - thus advised he eat softer foods if  tolerated better *when advanced* and masticate to mush.  No indication of neuro based dysphagia.  Recommend diet as MD indicates, education completed and sign posted in his room. No SLP follow up needed SLP Visit Diagnosis: Dysphagia, unspecified (R13.10)    Aspiration Risk  Mild aspiration risk    Diet Recommendation Other (Comment);NPO (as MD indicates)   Medication Administration: Other (Comment) (as tolerated) Supervision: Patient able to self feed Compensations: Slow rate;Small sips/bites Postural Changes: Seated upright at 90 degrees;Remain upright for at least 30 minutes after po intake    Other  Recommendations Oral Care Recommendations: Oral care BID    Recommendations for follow up therapy are one component of a multi-disciplinary discharge planning process, led by the attending physician.  Recommendations may be updated based on patient status, additional functional criteria and insurance authorization.  Follow up Recommendations No SLP follow up      Assistance Recommended at Discharge None  Functional Status Assessment Patient has not had a recent decline in their functional status  Frequency and Duration     N/a       Prognosis    N/a    Swallow Study   General Date of Onset: 07/10/22 HPI: Pt is an 81 yo male adm to [facility] diagnosed with Acute cholecystitis with chronic cholecystitis, s/p ECRP on 8/24.  Pt found to have nonobstructing distal esopahgeal stricture. PMH + for asthma, HTN, PVD, GERD, anemia, constipation, left parietal hemorrhage, cervical spine imaging showed "Foraminal narrowing at C3-4, primarily on the right due to spurs.  2.  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  No acute abnormalities".  Swallow evaluation ordered. Pt reported abdomen pain to GI and is interetested in eating s/p ERCP. Most recent CXR negative. Type of Study: Bedside Swallow Evaluation Diet Prior to this Study: Thin liquids Temperature Spikes Noted: No History of Recent Intubation:  No Behavior/Cognition: Alert;Cooperative;Pleasant mood Oral Cavity Assessment: Erythema Oral Care Completed by SLP: No Oral Cavity - Dentition: Adequate natural dentition Vision: Functional for self-feeding Self-Feeding Abilities: Able to feed self Patient Positioning: Upright in bed Baseline Vocal Quality: Low vocal intensity Volitional Cough: Other (Comment) (dnt due to abd discomfort with movement) Volitional Swallow: Able to elicit    Oral/Motor/Sensory Function Overall Oral Motor/Sensory Function: Within functional limits   Ice Chips Ice chips: Within functional limits Presentation: Spoon   Thin Liquid Thin Liquid: Within functional limits Presentation: Cup;Self Fed    Nectar Thick Nectar Thick Liquid: Not tested   Honey Thick Honey Thick Liquid: Not tested   Puree Puree: Within functional limits Presentation: Self Fed;Spoon   Solid     Solid: Not tested      Francisco [name] 07/10/2022,12:15 PM  Francisco Lime, MS [facility] SLP [place] Office [phone] Pager [phone]

## 2022-07-10 NOTE — Anesthesia Postprocedure Evaluation (Signed)
Anesthesia Post Note  Patient: Francisco [name]  Procedure(s) Performed: ENDOSCOPIC RETROGRADE CHOLANGIOPANCREATOGRAPHY (ERCP) [place] PLACEMENT     Patient location during evaluation: PACU Anesthesia Type: General Level of consciousness: awake and alert Pain management: pain level controlled Vital Signs Assessment: post-procedure vital signs reviewed and stable Respiratory status: spontaneous breathing, nonlabored ventilation and respiratory function stable Cardiovascular status: stable and blood pressure returned to baseline Anesthetic complications: no   No notable events documented.  Last Vitals:  Vitals:   07/10/22 0546 07/10/22 1127  BP: (!) 158/88 (!) 191/84  Pulse: 70 66  Resp:  18  Temp:  37.1 C  SpO2:  97%    Last Pain:  Vitals:   07/10/22 1127  TempSrc: Oral  PainSc:                  Audry Pili

## 2022-07-11 DIAGNOSIS — R7989 Other specified abnormal findings of blood chemistry: Secondary | ICD-10-CM | POA: Diagnosis not present

## 2022-07-11 DIAGNOSIS — K8000 Calculus of gallbladder with acute cholecystitis without obstruction: Secondary | ICD-10-CM | POA: Diagnosis not present

## 2022-07-11 DIAGNOSIS — K812 Acute cholecystitis with chronic cholecystitis: Secondary | ICD-10-CM | POA: Diagnosis not present

## 2022-07-11 DIAGNOSIS — N1831 Chronic kidney disease, stage 3a: Secondary | ICD-10-CM | POA: Diagnosis not present

## 2022-07-11 LAB — CBC WITH DIFFERENTIAL/PLATELET
Abs Immature Granulocytes: 0.06 10*3/uL (ref 0.00–0.07)
Basophils Absolute: 0.1 10*3/uL (ref 0.0–0.1)
Basophils Relative: 1 %
Eosinophils Absolute: 0.2 10*3/uL (ref 0.0–0.5)
Eosinophils Relative: 2 %
HCT: 33.6 % — ABNORMAL LOW (ref 39.0–52.0)
Hemoglobin: 11.6 g/dL — ABNORMAL LOW (ref 13.0–17.0)
Immature Granulocytes: 1 %
Lymphocytes Relative: 14 %
Lymphs Abs: 1.2 10*3/uL (ref 0.7–4.0)
MCH: 32.6 pg (ref 26.0–34.0)
MCHC: 34.5 g/dL (ref 30.0–36.0)
MCV: 94.4 fL (ref 80.0–100.0)
Monocytes Absolute: 0.7 10*3/uL (ref 0.1–1.0)
Monocytes Relative: 8 %
Neutro Abs: 6.8 10*3/uL (ref 1.7–7.7)
Neutrophils Relative %: 74 %
Platelets: 165 10*3/uL (ref 150–400)
RBC: 3.56 MIL/uL — ABNORMAL LOW (ref 4.22–5.81)
RDW: 13.2 % (ref 11.5–15.5)
WBC: 9.1 10*3/uL (ref 4.0–10.5)
nRBC: 0 % (ref 0.0–0.2)

## 2022-07-11 LAB — COMPREHENSIVE METABOLIC PANEL
ALT: 77 U/L — ABNORMAL HIGH (ref 0–44)
AST: 27 U/L (ref 15–41)
Albumin: 3 g/dL — ABNORMAL LOW (ref 3.5–5.0)
Alkaline Phosphatase: 47 U/L (ref 38–126)
Anion gap: 6 (ref 5–15)
BUN: 18 mg/dL (ref 8–23)
CO2: 29 mmol/L (ref 22–32)
Calcium: 8.4 mg/dL — ABNORMAL LOW (ref 8.9–10.3)
Chloride: 106 mmol/L (ref 98–111)
Creatinine, Ser: 1.19 mg/dL (ref 0.61–1.24)
GFR, Estimated: >60 mL/min (ref >60)
Glucose, Bld: 111 mg/dL — ABNORMAL HIGH (ref 70–99)
Potassium: 3.7 mmol/L (ref 3.5–5.1)
Sodium: 141 mmol/L (ref 135–145)
Total Bilirubin: 0.7 mg/dL (ref 0.3–1.2)
Total Protein: 5.5 g/dL — ABNORMAL LOW (ref 6.5–8.1)

## 2022-07-11 LAB — GLUCOSE, CAPILLARY
Glucose-Capillary: 123 mg/dL — ABNORMAL HIGH (ref 70–99)
Glucose-Capillary: 139 mg/dL — ABNORMAL HIGH (ref 70–99)
Glucose-Capillary: 121 mg/dL — ABNORMAL HIGH (ref 70–99)
Glucose-Capillary: 198 mg/dL — ABNORMAL HIGH (ref 70–99)

## 2022-07-11 LAB — CULTURE, BLOOD (ROUTINE X 2)
Culture: NO GROWTH
Special Requests: ADEQUATE

## 2022-07-11 LAB — MAGNESIUM: Magnesium: 2.1 mg/dL (ref 1.7–2.4)

## 2022-07-11 LAB — PHOSPHORUS: Phosphorus: 3.2 mg/dL (ref 2.5–4.6)

## 2022-07-11 MED ORDER — OXYCODONE HCL 5 MG PO TABS
5.0000 mg | ORAL_TABLET | ORAL | Status: DC | PRN
  Administered 2022-07-11 – 2022-07-13 (×4): 5 mg via ORAL
  Filled 2022-07-11 (×4): qty 1

## 2022-07-11 NOTE — Progress Notes (Signed)
2 Days Post-Op   Subjective/Chief Complaint: Nauseated, right side hurts, up and around, didn't eat dinner last night, urinating   Objective: Vital signs in last 24 hours: Temp:  [97.8 F (36.6 C)-98.7 F (37.1 C)] 97.8 F (36.6 C) (08/26 0429) Pulse Rate:  [66-69] 68 (08/26 0429) Resp:  [16-18] 16 (08/26 0429) BP: (181-191)/(77-85) 184/85 (08/26 0429) SpO2:  [97 %] 97 % (08/26 0429) Last BM Date : 07/10/22  Intake/Output from previous day: 08/25 0701 - 08/26 0700 In: 720 [P.O.:720] Out: 1275 [Urine:1250; Drains:25] Intake/Output this shift: Total I/O In: -  Out: 300 [Urine:300]  General: NAD Lungs: clear, effort normal Abd: soft, ND, +BS, mild expected postoperative tenderness over incisions which have dressings C/D/I, drain with minimal output mild bilious     Lab Results:  Recent Labs    07/10/22 0445 07/11/22 0521  WBC 8.4 9.1  HGB 11.2* 11.6*  HCT 32.6* 33.6*  PLT 163 165   BMET Recent Labs    07/10/22 0445 07/11/22 0521  NA 143 141  K 3.8 3.7  CL 110 106  CO2 26 29  GLUCOSE 125* 111*  BUN 21 18  CREATININE 1.34* 1.19  CALCIUM 8.5* 8.4*   PT/INR No results for input(s): "LABPROT", "INR" in the last 72 hours. ABG No results for input(s): "PHART", "HCO3" in the last 72 hours.  Invalid input(s): "PCO2", "PO2"  Studies/Results: DG CHEST PORT 1 VIEW  Result Date: 07/10/2022 CLINICAL DATA:  10026; shortness of breath EXAM: PORTABLE CHEST 1 VIEW COMPARISON:  July 05, 2022 FINDINGS: The heart size and mediastinal contours are within normal limits. Stable atheromatous calcifications of the arch of the aorta. Both lungs are clear and stable. The visualized skeletal structures are unremarkable. Again seen are the ACDF changes of the lower cervical spine. IMPRESSION: No active cardiopulmonary disease. Electronically Signed   By: Frazier Richards M.D.   On: 07/10/2022 13:18   DG Abd 1 View  Result Date: 07/10/2022 CLINICAL DATA:  Nausea and abdominal pain  EXAM: ABDOMEN - 1 VIEW COMPARISON:  2014 FINDINGS: Common bile duct stent is noted. Right upper quadrant drain. Bowel gas pattern is unremarkable. IMPRESSION: Unremarkable bowel gas pattern. Electronically Signed   By: Macy Mis M.D.   On: 07/10/2022 10:09   DG ERCP  Result Date: 07/09/2022 CLINICAL DATA:  ERCP following subtotal cholecystectomy and concern for bile leak EXAM: ERCP TECHNIQUE: Multiple spot images obtained with the fluoroscopic device and submitted for interpretation post-procedure. FLUOROSCOPY TIME: FLUOROSCOPY TIME 1 minute, 47 seconds (22 mGy) COMPARISON:  Right upper quadrant abdominal ultrasound-07/05/2022 Nuclear medicine HIDA scan-07/09/2022 FINDINGS: Twelve spot intraoperative fluoroscopic images of the right upper abdominal quadrant during ERCP are provided for review. Initial image demonstrates an ERCP probe overlying the right upper abdominal quadrant. Cholecystectomy clips overlies expected location of the gallbladder fossa. A large bore surgical drainage catheter overlies the gallbladder fossa Subsequent images demonstrate selective cannulation and opacification of the CBD. There is opacification of the residual cystic duct with extravasation of contrast adjacent to the end of the large bore surgical drainage catheter (representative image 5). Subsequent images demonstrate placement of an internal biliary stent though the superior landing zone of the stent is suboptimally visualized. IMPRESSION: 1. ERCP with findings suggestive of a biliary leak from the cystic duct remnant. 2. Post biliary stent placement though the superior landing zone of the stent is suboptimally visualized, not definitively across the origin of the cystic duct. Further evaluation with CT could be performed as indicated. These  images were submitted for radiologic interpretation only. Please see the procedural report for the amount of contrast and the fluoroscopy time utilized. Electronically Signed   By:  Sandi Mariscal M.D.   On: 07/09/2022 16:45    Anti-infectives: Anti-infectives (From admission, onward)    Start     Dose/Rate Route Frequency Ordered Stop   07/06/22 1245  ceFEPIme (MAXIPIME) 2 g in sodium chloride 0.9 % 100 mL IVPB  Status:  Discontinued        2 g 200 mL/hr over 30 Minutes Intravenous Every 12 hours 07/06/22 1158 07/10/22 1345   07/05/22 1215  ceFEPIme (MAXIPIME) 2 g in sodium chloride 0.9 % 100 mL IVPB        2 g 200 mL/hr over 30 Minutes Intravenous  Once 07/05/22 1201 07/05/22 1313   07/05/22 1215  metroNIDAZOLE (FLAGYL) IVPB 500 mg  Status:  Discontinued        500 mg 100 mL/hr over 60 Minutes Intravenous Every 12 hours 07/05/22 1203 07/10/22 1345       Assessment/Plan: POD 6 s/p laparoscopic subtotal fenestrated cholecystectomy with drain placement Dr. Zenia Resides 07/05/2022 -HIDA positive for bile leak and underwent ERCP 8/24 by Dr. Fuller Plan -Continue drain for now -Having some abdominal pain and nausea advance to full's per GI.  Okay to continue to advance as tolerated if continues with nausea and pain will consider ct to ensure no undrained bile -SLP evaluated -patient with mild aspiration risk but cleared for diet advancement per SLP -Multimodal pain control -Mobilize with PT/OT, recommending home health -POC following to arrange home health, cannot be discharged until taking po and good dc plan   FEN: Dysphagia 1 advance as tolerated dysphagia 3 VTE: Lovenox   GERD -Protonix Anemia-stable CKD  Hypertension -Home medications GAD -Home medications    Rolm Bookbinder 07/11/2022

## 2022-07-11 NOTE — Progress Notes (Signed)
PROGRESS NOTE    Francisco [name]  NWG:[id number] DOB: 1940-12-25 DOA: 07/05/2022 PCP: [name], FNP   Brief Narrative:  The patient is an 81 year old Caucasian male with past medical history significant for but limited to hypertension, hyperlipidemia, GERD, chronic kidney disease stage IIIa, as well as other comorbidities who was admitted to the hospital under surgical service for acute on chronic cholecystitis and underwent cholecystectomy.  Postoperatively patient ended up having a bile leak and hospitalist service was consulted to help address his medical issues.  He has been consulted for his subsequent bile leak and he has a percutaneous biliary drain in place.  He continues to have significant discomfort and is on IV cefepime and Flagyl and the plan is for ERCP and patient underwent biliary stenting with a plastic stent to help seal the bile leak.   Today he continues to have some abdominal discomfort and increased nausea as well as some slight shortness of breath.  Gastroenterology is recommending scheduled Zofran and Compazine as needed and they are going to advance his diet to full liquid diet and advance as tolerated.  There are planning ERCP biliary stent removal within 3 months. SLP evaluated and he was a mild aspiration risk.  Patient remains little bit nauseous and continues to have some abdominal discomfort.  Per general surgery if he continues to be nauseous and pain is uncontrolled still will consider a repeat CT scan to ensure there is no undrained bile  Assessment and Plan:  Acute on chronic cholecystitis with complication of a bile leak Abnormal LFTs Abdominal Pain -Status post cholecystectomy with drain placement: General surgery recommending continuing drain today -Currently on IV antibiotics with cefepime and metronidazole which we will continue but neurosurgery stopping today -Patient underwent a nuclear medicine HIDA scan which was concerning for a leak and  general surgery is not a surprise given his severe cholecystitis and difficult subtotal cholecystectomy and have asked GI to evaluate for ERCP and stenting and plan is for ERCP with likely biliary stent and decompress the bile duct to help the bile leak still.  Patient is status post plastic stenting of his biliary duct and is postoperative day 2 and he continues to have some abdominal discomfort and nausea -GI is following and recommending a full liquid diet and advancing as tolerated and have scheduled Zofran and added as needed Compazine -Lipase is being checked -AST went from 39 -> 29 -> 43 and is now 27 -ALT went from 180 -> 128 -> 102 and is now 62 -Plan is for ERCP today as he is NPO and Lovenox held for ERCP; after his ERCP they are recommending a solid diet -Pathology has been benign from his surgery -Postoperative care per general surgery and they are recommending continuing the drain for now -PT/OT recommending Home Health  -GI recommending repeating ERCP as an outpatient with Dr. Henrene Pastor in 3 months with cholangiogram and stent pull -GI has recommended bowel regimen and have signed off the case now -Given that patient continue to be nauseous and continues to have some abdominal pain there considering a repeat CT scan of the abdomen pelvis to ensure that there is no undrained bile -Continue to monitor carefully   Dyspnea with cough, improving -States this happened post ERCP and chest x-ray done and showed no acute cardiopulmonary disease -Continue with incentive spirometry and monitoring -SLP shows that he is a minimal aspiration risk -Recommend ambulatory home O2 screen prior to discharge and will do 1 tomorrow morning  Chronic Kidney Disease Stage IIIa -Baseline creatinine approximately 1.5 -Creatinine appears to be near baseline and now BUNs/creatinine has gone from 19/1.20 -> 17/1.40 -> 21/1.34 -> 18/1.19 -Continue to monitor -Appears that he is chronically on meloxicam as an  outpatient.  Would not resume this on discharge -Outpatient Lasix dose currently on hold.  Resume once he has adequate p.o. intake -Avoid further nephrotoxic medications, contrast dyes, hypotension and dehydration to ensure adequate renal perfusion if possible and renally dose medications -Repeat CMP in a.m.   Hyperlipidemia -Continued statin but may need to hold given his abnormal LFTs as AST is now 27 and ALT is 77 -Continue monitor and trend and repeat CMP in the a.m. and if necessary obtain a right upper quadrant ultrasound and acute hepatitis panel   Hypertension -Continued on Toprol -Blood pressure currently stable we will need to monitor blood pressures carefully -Last BP reading was elevated at 135/72 -We will add IV hydralazine 10 mg every 6 as needed for SBP greater than 161 or diastolic blood pressure greater 100   GERD/GI prophylaxis -Continue PPI   Constipation -GI and general surgery following and he is on fiber and MiraLAX   BPH -Continued on Tamsulosin    Hyperglycemia -CBGs ranging from 111-141 -Check A1c and was not elevated and was 5.3   Hypophosphatemia -Patient's Phos level is 3.2 -Continue monitor and trend and repeat Phos level in a.m.   Hypoalbuminemia -Patient's albumin level is 3.0 -> 3.2 -> 3.0 x2 -Continue monitor and trend and repeat CMP in a.m.   Normocytic anemia -Patient's hemoglobin/hematocrit is now 11.6/33.6 with an MCV of 94.4 -Checked Anemia panel and showed an iron level of 62, U IBC 147, TIBC 209, saturation ratio 30%, ferritin level 257, folate of 8.7, vitamin B12 1111 -Continue monitor for signs and symptoms bleeding; no overt bleeding noted -Repeat CBC in a.m.   Thrombocytopenia - Improved as Patient's platelet count has gone from 107 -> 126 -> 160 -> 163 -> 165 -Continue monitor and trend and evaluate for signs and symptoms of bleeding and repeat CBC in a.m.  DVT prophylaxis: enoxaparin (LOVENOX) injection 40 mg Start: 07/10/22  1500 SCD's Start: 07/05/22 1507    Code Status: Full Code Family Communication: No family currently at bedside  Disposition Plan:  Level of care: Med-Surg Status is: Inpatient Remains inpatient appropriate because: Cannot be discharged until he is taking p.o. and has a good discharge disposition planning   Consultants:  General surgery Gastroenterology [facility]  Procedures:  Procedure: Laparoscopic subtotal fenestrating cholecystectomy with drain placement   ERCP Findings:      The scout film showed a RUQ drain and was otherwise normal. The scope       was advanced to a normal major papilla with a hooded fold in the       descending duodenum. Limited examination of the pharynx, larynx and       associated structures, and upper GI tract showed a mild distal       esophageal stricture and otherwise appeared normal. A straight       Roadrunner wire was passed into the biliary tree. The short-nosed       traction sphincterotome was passed over the guidewire and the bile duct       was then deeply cannulated. Contrast was injected. I personally       interpreted the bile duct images. There was appropriate flow of contrast       through the ducts. Extravasation of  contrast originating from the       gallbladder remnant was observed. A subtotal cholecystectomy had been       performed. The biliary tree otherwise appeared normal. The intrahepatic       ducts were not fully filled with contrast flowing to the leak. A 5 mm       biliary sphincterotomy was made with a traction (standard)       sphincterotome using ERBE electrocautery to facilitate stent placement.       There was no post-sphincterotomy bleeding. One 10 Fr by 5 cm plastic       stent with a single external flap and a single internal flap was placed       4 cm into the common bile duct. Bile flowed through the stent. The stent       was in good position. The PD was not cannulated or injected by  intention. Impression:               - A bile leak was found.                           - Prior subtotal cholecystectomy.                           - A biliary sphincterotomy was performed.                           - One plastic stent was placed into the common bile                            duct.                           - Non obstructing distal esophageal stricture. Moderate Sedation:      Not Applicable - Patient had care per Anesthesia. Recommendation:           - Return patient to hospital ward for ongoing care.                           - Avoid aspirin and nonsteroidal anti-inflammatory                            medicines for 1 week.                           - Observe patient's clinical course following                            today's ERCP with therapeutic intervention.                           - ERCP with biliary stent removal in 3 months with                            Dr. Henrene Pastor.   Antimicrobials:  Anti-infectives (From admission, onward)    Start     Dose/Rate Route Frequency Ordered Stop   07/06/22 1245  ceFEPIme (MAXIPIME) 2 g in sodium chloride 0.9 %  100 mL IVPB  Status:  Discontinued        2 g 200 mL/hr over 30 Minutes Intravenous Every 12 hours 07/06/22 1158 07/10/22 1345   07/05/22 1215  ceFEPIme (MAXIPIME) 2 g in sodium chloride 0.9 % 100 mL IVPB        2 g 200 mL/hr over 30 Minutes Intravenous  Once 07/05/22 1201 07/05/22 1313   07/05/22 1215  metroNIDAZOLE (FLAGYL) IVPB 500 mg  Status:  Discontinued        500 mg 100 mL/hr over 60 Minutes Intravenous Every 12 hours 07/05/22 1203 07/10/22 1345       Subjective: Seen and examined at bedside and continues to have some nausea states his abdomen was hurting today.  Feels okay and thinks he ambulated the halls without issues yesterday.  No lightheadedness or dizziness.  Denies any other concerns or complaints at this time.  Objective: Vitals:   07/10/22 1127 07/10/22 2134 07/11/22 0429 07/11/22 1113  BP:  (!) 191/84 (!) 181/77 (!) 184/85 135/72  Pulse: 66 69 68 66  Resp: '18 18 16 16  '$ Temp: 98.7 F (37.1 C) 98.6 F (37 C) 97.8 F (36.6 C) 98.1 F (36.7 C)  TempSrc: Oral Oral Oral Oral  SpO2: 97% 97% 97% 97%  Weight:      Height:        Intake/Output Summary (Last 24 hours) at 07/11/2022 1704 Last data filed at 07/11/2022 1653 Gross per 24 hour  Intake 600 ml  Output 1495 ml  Net -895 ml   Filed Weights   07/08/22 0413 07/09/22 1418 07/10/22 0500  Weight: 95.7 kg 95.7 kg 94.2 kg   Examination: Physical Exam:  Constitutional: WN/WD obese elderly Caucasian male currently no acute distress Respiratory: Diminished to auscultation bilaterally with coarse breath sounds, no wheezing, rales, rhonchi or crackles. Normal respiratory effort and patient is not tachypenic. No accessory muscle use.  Unlabored breathing and is not wearing supplemental oxygen at the cannula Cardiovascular: RRR, no murmurs / rubs / gallops. S1 and S2 auscultated. No extremity edema. Abdomen: Soft, slightly-tender, distended secondary body habitus and has a drain in place.  Bowel sounds positive.  GU: Deferred. Musculoskeletal: No clubbing / cyanosis of digits/nails. No joint deformity upper and lower extremities.  Skin: No rashes, lesions, ulcers on limited skin evaluation. No induration; Warm and dry.  Neurologic: CN 2-12 grossly intact with no focal deficits. Romberg sign and cerebellar reflexes not assessed.  Psychiatric: Normal judgment and insight. Alert and oriented x 3. Normal mood and appropriate affect.   Data Reviewed: I have personally reviewed following labs and imaging studies  CBC: Recent Labs  Lab 07/05/22 1049 07/06/22 0329 07/07/22 0033 07/08/22 1024 07/09/22 0447 07/10/22 0445 07/11/22 0521  WBC 9.3   < > 10.4 10.1 9.0 8.4 9.1  NEUTROABS 8.0*  --   --  8.9* 6.9 6.8 6.8  HGB 11.5*   < > 10.9* 11.4* 11.7* 11.2* 11.6*  HCT 34.2*   < > 33.9* 33.3* 33.8* 32.6* 33.6*  MCV 95.8   < > 101.8*  95.1 94.2 93.4 94.4  PLT 135*   < > 107* 126* 160 163 165   < > = values in this interval not displayed.   Basic Metabolic Panel: Recent Labs  Lab 07/07/22 0033 07/08/22 0453 07/09/22 0447 07/10/22 0445 07/11/22 0521  NA 139 142 140 143 141  K 4.3 4.1 4.1 3.8 3.7  CL 109 110 108 110 106  CO2 '24 25 26 '$ 26  29  GLUCOSE 141* 130* 123* 125* 111*  BUN 25* '19 17 21 18  '$ CREATININE 1.50* 1.20 1.40* 1.34* 1.19  CALCIUM 8.1* 8.7* 8.7* 8.5* 8.4*  MG 2.1 2.0 2.0 2.2 2.1  PHOS 2.5 2.4* 2.3* 3.9 3.2   GFR: Estimated Creatinine Clearance: 55.2 mL/min (by C-G formula based on SCr of 1.19 mg/dL). Liver Function Tests: Recent Labs  Lab 07/05/22 1049 07/08/22 0453 07/09/22 0447 07/10/22 0445 07/11/22 0521  AST 15 39 29 43* 27  ALT 12 180* 128* 102* 77*  ALKPHOS 30* 51 50 49 47  BILITOT 0.6 0.6 0.8 0.9 0.7  PROT 5.1* 5.6* 5.9* 5.6* 5.5*  ALBUMIN 3.2* 3.0* 3.2* 3.0* 3.0*   Recent Labs  Lab 07/05/22 1049 07/10/22 0445  LIPASE 32 32   No results for input(s): "AMMONIA" in the last 168 hours. Coagulation Profile: Recent Labs  Lab 07/05/22 1232  INR 1.2   Cardiac Enzymes: No results for input(s): "CKTOTAL", "CKMB", "CKMBINDEX", "TROPONINI" in the last 168 hours. BNP (last 3 results) No results for input(s): "PROBNP" in the last 8760 hours. HbA1C: No results for input(s): "HGBA1C" in the last 72 hours. CBG: Recent Labs  Lab 07/10/22 1122 07/10/22 1651 07/10/22 2138 07/11/22 0719 07/11/22 1111  GLUCAP 132* 140* 123* 123* 139*   Lipid Profile: No results for input(s): "CHOL", "HDL", "LDLCALC", "TRIG", "CHOLHDL", "LDLDIRECT" in the last 72 hours. Thyroid Function Tests: No results for input(s): "TSH", "T4TOTAL", "FREET4", "T3FREE", "THYROIDAB" in the last 72 hours. Anemia Panel: Recent Labs    07/09/22 0447  VITAMINB12 1,111*  FOLATE 8.7  FERRITIN 257  TIBC 209*  IRON 62  RETICCTPCT 1.6   Sepsis Labs: Recent Labs  Lab 07/05/22 1232 07/05/22 1900  LATICACIDVEN  2.8* 1.2    Recent Results (from the past 240 hour(s))  Resp Panel by RT-PCR (Flu A&B, Covid)     Status: None   Collection Time: 07/05/22 12:32 PM   Specimen: Nasal Swab  Result Value Ref Range Status   SARS Coronavirus 2 by RT PCR NEGATIVE NEGATIVE Final    Comment: (NOTE) SARS-CoV-2 target nucleic acids are NOT DETECTED.  The SARS-CoV-2 RNA is generally detectable in upper respiratory specimens during the acute phase of infection. The lowest concentration of SARS-CoV-2 viral copies this assay can detect is 138 copies/mL. A negative result does not preclude SARS-Cov-2 infection and should not be used as the sole basis for treatment or other patient management decisions. A negative result may occur with  improper specimen collection/handling, submission of specimen other than nasopharyngeal swab, presence of viral mutation(s) within the areas targeted by this assay, and inadequate number of viral copies(<138 copies/mL). A negative result must be combined with clinical observations, patient history, and epidemiological information. The expected result is Negative.  Fact Sheet for Patients:  EntrepreneurPulse.com.au  Fact Sheet for Healthcare Providers:  IncredibleEmployment.be  This test is no t yet approved or cleared by the [place] FDA and  has been authorized for detection and/or diagnosis of SARS-CoV-2 by FDA under an Emergency Use Authorization (EUA). This EUA will remain  in effect (meaning this test can be used) for the duration of the COVID-19 declaration under Section 564(b)(1) of the Act, 21 U.S.C.section 360bbb-3(b)(1), unless the authorization is terminated  or revoked sooner.       Influenza A by PCR NEGATIVE NEGATIVE Final   Influenza B by PCR NEGATIVE NEGATIVE Final    Comment: (NOTE) The Xpert Xpress SARS-CoV-2/FLU/RSV plus assay is intended as an aid in  the diagnosis of influenza from Nasopharyngeal swab specimens  and should not be used as a sole basis for treatment. Nasal washings and aspirates are unacceptable for Xpert Xpress SARS-CoV-2/FLU/RSV testing.  Fact Sheet for Patients: EntrepreneurPulse.com.au  Fact Sheet for Healthcare Providers: IncredibleEmployment.be  This test is not yet approved or cleared by the [place] FDA and has been authorized for detection and/or diagnosis of SARS-CoV-2 by FDA under an Emergency Use Authorization (EUA). This EUA will remain in effect (meaning this test can be used) for the duration of the COVID-19 declaration under Section 564(b)(1) of the Act, 21 U.S.C. section 360bbb-3(b)(1), unless the authorization is terminated or revoked.  Performed at [facility], [place] [address], [place], [place] [address]   Blood Culture (routine x 2)     Status: None   Collection Time: 07/05/22 12:32 PM   Specimen: BLOOD  Result Value Ref Range Status   Specimen Description   Final    BLOOD LEFT ANTECUBITAL Performed at [facility] [address] [place], [place] [address]    Special Requests   Final    BOTTLES DRAWN AEROBIC AND ANAEROBIC Blood Culture adequate volume Performed at [facility] [address] [place], [place] [address]    Culture  Setup Time   Final    GRAM POSITIVE RODS AEROBIC BOTTLE ONLY CRITICAL RESULT CALLED TO, READ BACK BY AND VERIFIED WITH: E JACKSON,PHARMD'@0200'$  07/07/22 [place]    Culture   Final    TURICELLA OTITIDIS Standardized susceptibility testing for this organism is not available. Performed at [facility], [place] [address] [place], [place] [address]    Report Status 07/09/2022 FINAL  Final  Blood Culture (routine x 2)     Status: None   Collection Time: 07/06/22  3:29 AM   Specimen: BLOOD  Result Value Ref Range Status   Specimen Description   Final    BLOOD BLOOD RIGHT HAND Performed at [facility] [address] [place], [place] [address]    Special Requests   Final    BOTTLES DRAWN AEROBIC ONLY Blood Culture adequate volume Performed at [facility] [address], [place], [place] [address]    Culture   Final    NO GROWTH 5 DAYS Performed at [facility], [place] [address] [place], [place] [address]    Report Status 07/11/2022 FINAL  Final  Urine Culture     Status: None   Collection Time: 07/06/22  2:00 PM   Specimen: Urine, Clean Catch  Result Value Ref Range Status   Specimen Description   Final    URINE, CLEAN CATCH Performed at [facility], [place] [address], [place], [place] [address]    Special Requests   Final    NONE Performed at [facility], [place] [address], [place], [place] [address]    Culture   Final    NO GROWTH Performed at [facility], [place] [address] [place], [place] [address]    Report Status 07/07/2022 FINAL  Final     Radiology Studies: DG CHEST PORT 1 VIEW  Result Date: 07/10/2022 CLINICAL DATA:  10026; shortness of breath EXAM: PORTABLE CHEST 1 VIEW COMPARISON:  July 05, 2022 FINDINGS: The heart size and mediastinal contours are within normal limits. Stable atheromatous calcifications of the arch of the aorta. Both lungs are clear and stable. The visualized skeletal structures are unremarkable. Again seen are the ACDF  changes of the lower cervical spine. IMPRESSION: No active cardiopulmonary disease. Electronically Signed   By: Frazier Richards M.D.   On: 07/10/2022 13:18   DG Abd 1 View  Result Date: 07/10/2022 CLINICAL DATA:  Nausea and abdominal pain EXAM: ABDOMEN - 1 VIEW COMPARISON:  2014 FINDINGS: Common bile duct stent is noted. Right upper quadrant drain. Bowel gas pattern is unremarkable. IMPRESSION: Unremarkable bowel gas pattern. Electronically Signed   By: Macy Mis M.D.   On: 07/10/2022 10:09     Scheduled Meds:  acetaminophen  1,000 mg Oral TID   aspirin EC  81 mg  Oral Daily   atorvastatin  40 mg Oral Daily   docusate sodium  100 mg Oral BID   enoxaparin (LOVENOX) injection  40 mg Subcutaneous Q24H   escitalopram  20 mg Oral Daily   insulin aspart  0-15 Units Subcutaneous TID WC   insulin aspart  0-5 Units Subcutaneous QHS   lip balm   Topical BID   metoprolol succinate  25 mg Oral BID   ondansetron  4 mg Oral Q12H   Or   ondansetron (ZOFRAN) IV  4 mg Intravenous Q12H   pantoprazole  20 mg Oral Daily   polycarbophil  625 mg Oral BID   polyethylene glycol  17 g Oral BID   tamsulosin  0.4 mg Oral Daily   Continuous Infusions:   LOS: 4 days   Raiford Noble, DO [facility] Available via Epic secure chat 7am-7pm After these hours, please refer to coverage provider listed on [email or web address] 07/11/2022, 5:04 PM

## 2022-07-12 ENCOUNTER — Other Ambulatory Visit: Payer: Self-pay | Admitting: Cardiology

## 2022-07-12 ENCOUNTER — Encounter (HOSPITAL_COMMUNITY): Payer: Self-pay | Admitting: Gastroenterology

## 2022-07-12 ENCOUNTER — Inpatient Hospital Stay (HOSPITAL_COMMUNITY): Payer: Medicare Other

## 2022-07-12 DIAGNOSIS — K8000 Calculus of gallbladder with acute cholecystitis without obstruction: Secondary | ICD-10-CM | POA: Diagnosis not present

## 2022-07-12 DIAGNOSIS — R7989 Other specified abnormal findings of blood chemistry: Secondary | ICD-10-CM | POA: Diagnosis not present

## 2022-07-12 DIAGNOSIS — K812 Acute cholecystitis with chronic cholecystitis: Secondary | ICD-10-CM | POA: Diagnosis not present

## 2022-07-12 DIAGNOSIS — N1831 Chronic kidney disease, stage 3a: Secondary | ICD-10-CM | POA: Diagnosis not present

## 2022-07-12 LAB — MAGNESIUM: Magnesium: 2 mg/dL (ref 1.7–2.4)

## 2022-07-12 LAB — CREATININE, SERUM
Creatinine, Ser: 1.11 mg/dL (ref 0.61–1.24)
GFR, Estimated: >60 mL/min (ref >60)

## 2022-07-12 LAB — COMPREHENSIVE METABOLIC PANEL
ALT: 59 U/L — ABNORMAL HIGH (ref 0–44)
AST: 20 U/L (ref 15–41)
Albumin: 3.2 g/dL — ABNORMAL LOW (ref 3.5–5.0)
Alkaline Phosphatase: 52 U/L (ref 38–126)
Anion gap: 7 (ref 5–15)
BUN: 21 mg/dL (ref 8–23)
CO2: 28 mmol/L (ref 22–32)
Calcium: 8.1 mg/dL — ABNORMAL LOW (ref 8.9–10.3)
Chloride: 101 mmol/L (ref 98–111)
Creatinine, Ser: 1.05 mg/dL (ref 0.61–1.24)
GFR, Estimated: >60 mL/min (ref >60)
Glucose, Bld: 163 mg/dL — ABNORMAL HIGH (ref 70–99)
Potassium: 3.8 mmol/L (ref 3.5–5.1)
Sodium: 136 mmol/L (ref 135–145)
Total Bilirubin: 0.6 mg/dL (ref 0.3–1.2)
Total Protein: 5.9 g/dL — ABNORMAL LOW (ref 6.5–8.1)

## 2022-07-12 LAB — GLUCOSE, CAPILLARY
Glucose-Capillary: 147 mg/dL — ABNORMAL HIGH (ref 70–99)
Glucose-Capillary: 134 mg/dL — ABNORMAL HIGH (ref 70–99)
Glucose-Capillary: 176 mg/dL — ABNORMAL HIGH (ref 70–99)
Glucose-Capillary: 139 mg/dL — ABNORMAL HIGH (ref 70–99)

## 2022-07-12 LAB — CBC WITH DIFFERENTIAL/PLATELET
Abs Immature Granulocytes: 0.06 10*3/uL (ref 0.00–0.07)
Basophils Absolute: 0 10*3/uL (ref 0.0–0.1)
Basophils Relative: 0 %
Eosinophils Absolute: 0.3 10*3/uL (ref 0.0–0.5)
Eosinophils Relative: 3 %
HCT: 35.7 % — ABNORMAL LOW (ref 39.0–52.0)
Hemoglobin: 12.1 g/dL — ABNORMAL LOW (ref 13.0–17.0)
Immature Granulocytes: 1 %
Lymphocytes Relative: 8 %
Lymphs Abs: 0.9 10*3/uL (ref 0.7–4.0)
MCH: 32 pg (ref 26.0–34.0)
MCHC: 33.9 g/dL (ref 30.0–36.0)
MCV: 94.4 fL (ref 80.0–100.0)
Monocytes Absolute: 0.8 10*3/uL (ref 0.1–1.0)
Monocytes Relative: 7 %
Neutro Abs: 10.1 10*3/uL — ABNORMAL HIGH (ref 1.7–7.7)
Neutrophils Relative %: 81 %
Platelets: 209 10*3/uL (ref 150–400)
RBC: 3.78 MIL/uL — ABNORMAL LOW (ref 4.22–5.81)
RDW: 13.8 % (ref 11.5–15.5)
WBC: 12.2 10*3/uL — ABNORMAL HIGH (ref 4.0–10.5)
nRBC: 0 % (ref 0.0–0.2)

## 2022-07-12 LAB — PHOSPHORUS: Phosphorus: 3.1 mg/dL (ref 2.5–4.6)

## 2022-07-12 MED ORDER — IOHEXOL 300 MG/ML  SOLN
100.0000 mL | Freq: Once | INTRAMUSCULAR | Status: AC | PRN
  Administered 2022-07-12: 100 mL via INTRAVENOUS

## 2022-07-12 MED ORDER — SODIUM CHLORIDE (PF) 0.9 % IJ SOLN
INTRAMUSCULAR | Status: AC
  Filled 2022-07-12: qty 50

## 2022-07-12 MED ORDER — HYDROMORPHONE HCL 1 MG/ML IJ SOLN: 0.5000 mg | INTRAMUSCULAR | Status: DC | PRN

## 2022-07-12 MED ORDER — MENTHOL 3 MG MT LOZG: 1.0000 | LOZENGE | OROMUCOSAL | Status: DC | PRN

## 2022-07-12 MED ORDER — IOHEXOL 9 MG/ML PO SOLN
ORAL | Status: AC
  Administered 2022-07-12: 500 mL
  Filled 2022-07-12: qty 1000

## 2022-07-12 NOTE — Progress Notes (Signed)
3 Days Post-Op   Subjective/Chief Complaint: Still with ruq pain and nausea, at some yesterday   Objective: Vital signs in last 24 hours: Temp:  [98.1 F (36.7 C)-98.2 F (36.8 C)] 98.2 F (36.8 C) (08/27 0540) Pulse Rate:  [62-66] 62 (08/27 0650) Resp:  [16-18] 18 (08/27 0540) BP: (135-181)/(64-79) 177/79 (08/27 0650) SpO2:  [96 %-97 %] 96 % (08/27 0540) Last BM Date : 07/10/22  Intake/Output from previous day: 08/26 0701 - 08/27 0700 In: 480 [P.O.:480] Out: 1125 [Urine:1100; Drains:25] Intake/Output this shift: No intake/output data recorded.  General: NAD Lungs: clear, effort normal Abd: soft, ND, +BS, mild expected postoperative tenderness over incisions which have dressings C/D/I, drain with nothing in bulb    Lab Results:  Recent Labs    07/11/22 0521 07/12/22 0851  WBC 9.1 12.2*  HGB 11.6* 12.1*  HCT 33.6* 35.7*  PLT 165 209   BMET Recent Labs    07/10/22 0445 07/11/22 0521 07/12/22 0439  NA 143 141  --   K 3.8 3.7  --   CL 110 106  --   CO2 26 29  --   GLUCOSE 125* 111*  --   BUN 21 18  --   CREATININE 1.34* 1.19 1.11  CALCIUM 8.5* 8.4*  --    PT/INR No results for input(s): "LABPROT", "INR" in the last 72 hours. ABG No results for input(s): "PHART", "HCO3" in the last 72 hours.  Invalid input(s): "PCO2", "PO2"  Studies/Results: DG CHEST PORT 1 VIEW  Result Date: 07/10/2022 CLINICAL DATA:  10026; shortness of breath EXAM: PORTABLE CHEST 1 VIEW COMPARISON:  July 05, 2022 FINDINGS: The heart size and mediastinal contours are within normal limits. Stable atheromatous calcifications of the arch of the aorta. Both lungs are clear and stable. The visualized skeletal structures are unremarkable. Again seen are the ACDF changes of the lower cervical spine. IMPRESSION: No active cardiopulmonary disease. Electronically Signed   By: Frazier Richards M.D.   On: 07/10/2022 13:18   DG Abd 1 View  Result Date: 07/10/2022 CLINICAL DATA:  Nausea and  abdominal pain EXAM: ABDOMEN - 1 VIEW COMPARISON:  2014 FINDINGS: Common bile duct stent is noted. Right upper quadrant drain. Bowel gas pattern is unremarkable. IMPRESSION: Unremarkable bowel gas pattern. Electronically Signed   By: Macy Mis M.D.   On: 07/10/2022 10:09    Anti-infectives: Anti-infectives (From admission, onward)    Start     Dose/Rate Route Frequency Ordered Stop   07/06/22 1245  ceFEPIme (MAXIPIME) 2 g in sodium chloride 0.9 % 100 mL IVPB  Status:  Discontinued        2 g 200 mL/hr over 30 Minutes Intravenous Every 12 hours 07/06/22 1158 07/10/22 1345   07/05/22 1215  ceFEPIme (MAXIPIME) 2 g in sodium chloride 0.9 % 100 mL IVPB        2 g 200 mL/hr over 30 Minutes Intravenous  Once 07/05/22 1201 07/05/22 1313   07/05/22 1215  metroNIDAZOLE (FLAGYL) IVPB 500 mg  Status:  Discontinued        500 mg 100 mL/hr over 60 Minutes Intravenous Every 12 hours 07/05/22 1203 07/10/22 1345       Assessment/Plan: POD 7 s/p laparoscopic subtotal fenestrated cholecystectomy with drain placement Dr. Zenia [name] 07/05/2022 -HIDA positive for bile leak and underwent ERCP 8/24 by Dr. Fuller Plan -Continue drain for now -still with pain although not concerning on exam, with wbc up today will check ct scan to ensure no undrained collection when he  leaked although drain should have taken care of it.  -SLP evaluated -patient with mild aspiration risk but cleared for diet advancement per SLP -Multimodal pain control -Mobilize with PT/OT, recommending home health -POC following to arrange home health, cannot be discharged until taking po and good dc plan   FEN: dysphagia 3 VTE: Lovenox   GERD -Protonix Anemia-stable CKD  Hypertension -Home medications GAD -Home medications  Francisco [name] 07/12/2022

## 2022-07-12 NOTE — Progress Notes (Signed)
PROGRESS NOTE    Francisco [name]  GUR:[id number] DOB: [birth year] DOA: 07/05/2022 PCP: [name], FNP   Brief Narrative:  The patient is an 81 year old Caucasian male with past medical history significant for but limited to hypertension, hyperlipidemia, GERD, chronic kidney disease stage IIIa, as well as other comorbidities who was admitted to the hospital under surgical service for acute on chronic cholecystitis and underwent cholecystectomy.  Postoperatively patient ended up having a bile leak and hospitalist service was consulted to help address his medical issues.  He has been consulted for his subsequent bile leak and he has a percutaneous biliary drain in place.  He continues to have significant discomfort and is on IV cefepime and Flagyl and the plan is for ERCP and patient underwent biliary stenting with a plastic stent to help seal the bile leak.   Today he continues to have some abdominal discomfort and increased nausea as well as some slight shortness of breath.  Gastroenterology is recommending scheduled Zofran and Compazine as needed and they are going to advance his diet to full liquid diet and advance as tolerated.  There are planning ERCP biliary stent removal within 3 months. SLP evaluated and he was a mild aspiration risk.   Patient remains little bit nauseous and continues to have some abdominal discomfort.  Per general surgery since he continues to be nauseous and pain is still there, they are going to repeat a CT scan.  Assessment and Plan:  Acute on chronic cholecystitis with complication of a bile leak Abnormal LFTs Abdominal Pain -Status post cholecystectomy with drain placement: General surgery recommending continuing drain today -Currently on IV antibiotics with cefepime and metronidazole which we will continue but neurosurgery stopping today -Patient underwent a nuclear medicine HIDA scan which was concerning for a leak and general surgery is not a surprise  given his severe cholecystitis and difficult subtotal cholecystectomy and have asked GI to evaluate for ERCP and stenting and plan is for ERCP with likely biliary stent and decompress the bile duct to help the bile leak still.  Patient is status post plastic stenting of his biliary duct and is postoperative day 2 and he continues to have some abdominal discomfort and nausea -GI is following and recommending a full liquid diet and advancing as tolerated and have scheduled Zofran and added as needed Compazine -Lipase is being checked -AST went from 39 -> 29 -> 43 -> 27 -> 20 -ALT went from 180 -> 128 -> 102 -> 77 -> 59 -Plan is for ERCP today as he is NPO and Lovenox held for ERCP; after his ERCP they are recommending a solid diet -Pathology has been benign from his surgery -Postoperative care per general surgery and they are recommending continuing the drain for now -PT/OT recommending Home Health  -GI recommending repeating ERCP as an outpatient with Dr. Henrene Pastor in 3 months with cholangiogram and stent pull -WBC went from 9.1 -> 12.2 -GI has recommended bowel regimen and have signed off the case now -Given that patient continue to be nauseous and continues to have some abdominal pain he will undergo a repeat CT scan of the abdomen pelvis to ensure that there is no undrained bile -Continue to monitor carefully   Dyspnea with cough, improving -States this happened post ERCP and chest x-ray done and showed no acute cardiopulmonary disease -Continue with incentive spirometry and monitoring -SLP shows that he is a minimal aspiration risk -Recommend ambulatory home O2 screen prior to discharge and will do it soon  Chronic Kidney Disease Stage IIIa -Baseline creatinine approximately 1.5 -Creatinine appears to be near baseline and now BUNs/creatinine has gone from 19/1.20 -> 17/1.40 -> 21/1.34 -> 18/1.19 -> 21/1.05 -Continue to monitor -Appears that he is chronically on meloxicam as an outpatient.   Would not resume this on discharge -Outpatient Lasix dose currently on hold.  Resume once he has adequate p.o. intake -Avoid further nephrotoxic medications, contrast dyes, hypotension and dehydration to ensure adequate renal perfusion if possible and renally dose medications -Repeat CMP in a.m.   Hyperlipidemia -Continued statin but may need to hold given his abnormal LFTs as AST is now 20 and ALT is 59 -Continue monitor and trend and repeat CMP in the a.m. and if necessary obtain a right upper quadrant ultrasound and acute hepatitis panel   Hypertension -Continued on Toprol -Blood pressure currently stable we will need to monitor blood pressures carefully -Last BP reading was elevated at 127/70 -We will add IV hydralazine 10 mg every 6 as needed for SBP greater than 096 or diastolic blood pressure greater 100   GERD/GI prophylaxis -Continue PPI   Constipation -GI and general surgery following and he is on fiber and MiraLAX   BPH -Continued on Tamsulosin    Hyperglycemia -CBGs ranging from 121-198 -Check A1c and was not elevated and was 5.3   Hypophosphatemia -Patient's Phos level is 3.1 -Continue monitor and trend and repeat Phos level in a.m.   Hypoalbuminemia -Patient's albumin level is 3.0 -> 3.2 -> 3.0 x2 -> 3.2 -Continue monitor and trend and repeat CMP in a.m.   Normocytic anemia -Patient's hemoglobin/hematocrit is now gone from 11.6/33.6 -> 12.1/35.7 with an MCV of 94.4 -Checked Anemia panel and showed an iron level of 62, U IBC 147, TIBC 209, saturation ratio 30%, ferritin level 257, folate of 8.7, vitamin B12 1111 -Continue monitor for signs and symptoms bleeding; no overt bleeding noted -Repeat CBC in a.m.   Thrombocytopenia - Improved as Patient's platelet count has gone from 107 -> 126 -> 160 -> 163 -> 165 -> 209 -Continue monitor and trend and evaluate for signs and symptoms of bleeding and repeat CBC in a.m.   DVT prophylaxis: enoxaparin (LOVENOX)  injection 40 mg Start: 07/10/22 1500 SCD's Start: 07/05/22 1507    Code Status: Full Code Family Communication: No family currently at bedside  Disposition Plan:  Level of care: Med-Surg Status is: Inpatient Remains inpatient appropriate because: Needs further general surgery clearance   Consultants:  General Surgery Gastroenterology [facility]  Procedures:  Procedure: Laparoscopic subtotal fenestrating cholecystectomy with drain placement   ERCP Findings:      The scout film showed a RUQ drain and was otherwise normal. The scope       was advanced to a normal major papilla with a hooded fold in the       descending duodenum. Limited examination of the pharynx, larynx and       associated structures, and upper GI tract showed a mild distal       esophageal stricture and otherwise appeared normal. A straight       Roadrunner wire was passed into the biliary tree. The short-nosed       traction sphincterotome was passed over the guidewire and the bile duct       was then deeply cannulated. Contrast was injected. I personally       interpreted the bile duct images. There was appropriate flow of contrast       through the ducts. Extravasation  of contrast originating from the       gallbladder remnant was observed. A subtotal cholecystectomy had been       performed. The biliary tree otherwise appeared normal. The intrahepatic       ducts were not fully filled with contrast flowing to the leak. A 5 mm       biliary sphincterotomy was made with a traction (standard)       sphincterotome using ERBE electrocautery to facilitate stent placement.       There was no post-sphincterotomy bleeding. One 10 Fr by 5 cm plastic       stent with a single external flap and a single internal flap was placed       4 cm into the common bile duct. Bile flowed through the stent. The stent       was in good position. The PD was not cannulated or injected by intention. Impression:               -  A bile leak was found.                           - Prior subtotal cholecystectomy.                           - A biliary sphincterotomy was performed.                           - One plastic stent was placed into the common bile                            duct.                           - Non obstructing distal esophageal stricture. Moderate Sedation:      Not Applicable - Patient had care per Anesthesia. Recommendation:           - Return patient to hospital ward for ongoing care.                           - Avoid aspirin and nonsteroidal anti-inflammatory                            medicines for 1 week.                           - Observe patient's clinical course following                            today's ERCP with therapeutic intervention.                           - ERCP with biliary stent removal in 3 months with                            Dr. Henrene Pastor.  Antimicrobials:  Anti-infectives (From admission, onward)    Start     Dose/Rate Route Frequency Ordered Stop   07/06/22 1245  ceFEPIme (MAXIPIME) 2 g in sodium chloride 0.9 %  100 mL IVPB  Status:  Discontinued        2 g 200 mL/hr over 30 Minutes Intravenous Every 12 hours 07/06/22 1158 07/10/22 1345   07/05/22 1215  ceFEPIme (MAXIPIME) 2 g in sodium chloride 0.9 % 100 mL IVPB        2 g 200 mL/hr over 30 Minutes Intravenous  Once 07/05/22 1201 07/05/22 1313   07/05/22 1215  metroNIDAZOLE (FLAGYL) IVPB 500 mg  Status:  Discontinued        500 mg 100 mL/hr over 60 Minutes Intravenous Every 12 hours 07/05/22 1203 07/10/22 1345        Subjective:  And examined at bedside and he states that he still having some abdominal discomfort and pain.  Also had some nausea.  Also complaining about a sore throat and requesting a lozenge.  States that he still does not feel as well and wants to figure out what is going on.  General surgery is repeating a CT scan of the abdomen pelvis.  Denies any other concerns or complaints at this  time.  Objective: Vitals:   07/11/22 2022 07/12/22 0540 07/12/22 0650 07/12/22 1247  BP: (!) 149/64 (!) 181/77 (!) 177/79 127/70  Pulse: 62 65 62 66  Resp: '18 18  17  '$ Temp: 98.1 F (36.7 C) 98.2 F (36.8 C)  98.1 F (36.7 C)  TempSrc: Oral Oral  Oral  SpO2: 96% 96%  99%  Weight:      Height:        Intake/Output Summary (Last 24 hours) at 07/12/2022 1249 Last data filed at 07/12/2022 1213 Gross per 24 hour  Intake 720 ml  Output 585 ml  Net 135 ml   Filed Weights   07/08/22 0413 07/09/22 1418 07/10/22 0500  Weight: 95.7 kg 95.7 kg 94.2 kg   Examination: Physical Exam:  Constitutional: WN/WD obese Caucasian male currently no acute distress Respiratory: Diminished to auscultation bilaterally with coarse breath sounds, no wheezing, rales, rhonchi or crackles. Normal respiratory effort and patient is not tachypenic. No accessory muscle use.  Unlabored breathing and not wearing supplemental oxygen via nasal cannula Cardiovascular: RRR, no murmurs / rubs / gallops. S1 and S2 auscultated.  Abdomen: Soft, tender to palpate and distended secondary body habitus and has a drain in place. Bowel sounds positive.  GU: Deferred. Musculoskeletal: No clubbing / cyanosis of digits/nails. No joint deformity upper and lower extremities.  Skin: No rashes, lesions, ulcers on limited skin evaluation but does have a lesion on his lip. No induration; Warm and dry.  Neurologic: CN 2-12 grossly intact with no focal deficits. Romberg sign and cerebellar reflexes not assessed.  Psychiatric: Normal judgment and insight. Alert and oriented x 3. Normal mood and appropriate affect.   Data Reviewed: I have personally reviewed following labs and imaging studies  CBC: Recent Labs  Lab 07/08/22 1024 07/09/22 0447 07/10/22 0445 07/11/22 0521 07/12/22 0851  WBC 10.1 9.0 8.4 9.1 12.2*  NEUTROABS 8.9* 6.9 6.8 6.8 10.1*  HGB 11.4* 11.7* 11.2* 11.6* 12.1*  HCT 33.3* 33.8* 32.6* 33.6* 35.7*  MCV 95.1 94.2  93.4 94.4 94.4  PLT 126* 160 163 165 226   Basic Metabolic Panel: Recent Labs  Lab 07/08/22 0453 07/09/22 0447 07/10/22 0445 07/11/22 0521 07/12/22 0439 07/12/22 0851  NA 142 140 143 141  --  136  K 4.1 4.1 3.8 3.7  --  3.8  CL 110 108 110 106  --  101  CO2 '25 26 26 29  '$ --  28  GLUCOSE 130* 123* 125* 111*  --  163*  BUN '19 17 21 18  '$ --  21  CREATININE 1.20 1.40* 1.34* 1.19 1.11 1.05  CALCIUM 8.7* 8.7* 8.5* 8.4*  --  8.1*  MG 2.0 2.0 2.2 2.1  --  2.0  PHOS 2.4* 2.3* 3.9 3.2  --  3.1   GFR: Estimated Creatinine Clearance: 62.5 mL/min (by C-G formula based on SCr of 1.05 mg/dL). Liver Function Tests: Recent Labs  Lab 07/08/22 0453 07/09/22 0447 07/10/22 0445 07/11/22 0521 07/12/22 0851  AST 39 29 43* 27 20  ALT 180* 128* 102* 77* 59*  ALKPHOS 51 50 49 47 52  BILITOT 0.6 0.8 0.9 0.7 0.6  PROT 5.6* 5.9* 5.6* 5.5* 5.9*  ALBUMIN 3.0* 3.2* 3.0* 3.0* 3.2*   Recent Labs  Lab 07/10/22 0445  LIPASE 32   No results for input(s): "AMMONIA" in the last 168 hours. Coagulation Profile: No results for input(s): "INR", "PROTIME" in the last 168 hours. Cardiac Enzymes: No results for input(s): "CKTOTAL", "CKMB", "CKMBINDEX", "TROPONINI" in the last 168 hours. BNP (last 3 results) No results for input(s): "PROBNP" in the last 8760 hours. HbA1C: No results for input(s): "HGBA1C" in the last 72 hours. CBG: Recent Labs  Lab 07/11/22 1111 07/11/22 1650 07/11/22 2107 07/12/22 0727 07/12/22 1212  GLUCAP 139* 121* 198* 147* 134*   Lipid Profile: No results for input(s): "CHOL", "HDL", "LDLCALC", "TRIG", "CHOLHDL", "LDLDIRECT" in the last 72 hours. Thyroid Function Tests: No results for input(s): "TSH", "T4TOTAL", "FREET4", "T3FREE", "THYROIDAB" in the last 72 hours. Anemia Panel: No results for input(s): "VITAMINB12", "FOLATE", "FERRITIN", "TIBC", "IRON", "RETICCTPCT" in the last 72 hours. Sepsis Labs: Recent Labs  Lab 07/05/22 1900  LATICACIDVEN 1.2    Recent Results  (from the past 240 hour(s))  Resp Panel by RT-PCR (Flu A&B, Covid)     Status: None   Collection Time: 07/05/22 12:32 PM   Specimen: Nasal Swab  Result Value Ref Range Status   SARS Coronavirus 2 by RT PCR NEGATIVE NEGATIVE Final    Comment: (NOTE) SARS-CoV-2 target nucleic acids are NOT DETECTED.  The SARS-CoV-2 RNA is generally detectable in upper respiratory specimens during the acute phase of infection. The lowest concentration of SARS-CoV-2 viral copies this assay can detect is 138 copies/mL. A negative result does not preclude SARS-Cov-2 infection and should not be used as the sole basis for treatment or other patient management decisions. A negative result may occur with  improper specimen collection/handling, submission of specimen other than nasopharyngeal swab, presence of viral mutation(s) within the areas targeted by this assay, and inadequate number of viral copies(<138 copies/mL). A negative result must be combined with clinical observations, patient history, and epidemiological information. The expected result is Negative.  Fact Sheet for Patients:  EntrepreneurPulse.com.au  Fact Sheet for Healthcare Providers:  IncredibleEmployment.be  This test is no t yet approved or cleared by the [place] FDA and  has been authorized for detection and/or diagnosis of SARS-CoV-2 by FDA under an Emergency Use Authorization (EUA). This EUA will remain  in effect (meaning this test can be used) for the duration of the COVID-19 declaration under Section 564(b)(1) of the Act, 21 U.S.C.section 360bbb-3(b)(1), unless the authorization is terminated  or revoked sooner.       Influenza A by PCR NEGATIVE NEGATIVE Final   Influenza B by PCR NEGATIVE NEGATIVE Final    Comment: (NOTE) The Xpert Xpress SARS-CoV-2/FLU/RSV plus assay is intended as an aid in the diagnosis  of influenza from Nasopharyngeal swab specimens and should not be used as a  sole basis for treatment. Nasal washings and aspirates are unacceptable for Xpert Xpress SARS-CoV-2/FLU/RSV testing.  Fact Sheet for Patients: EntrepreneurPulse.com.au  Fact Sheet for Healthcare Providers: IncredibleEmployment.be  This test is not yet approved or cleared by the [place] FDA and has been authorized for detection and/or diagnosis of SARS-CoV-2 by FDA under an Emergency Use Authorization (EUA). This EUA will remain in effect (meaning this test can be used) for the duration of the COVID-19 declaration under Section 564(b)(1) of the Act, 21 U.S.C. section 360bbb-3(b)(1), unless the authorization is terminated or revoked.  Performed at [facility], [place] [address], [place], [place] [address]   Blood Culture (routine x 2)     Status: None   Collection Time: 07/05/22 12:32 PM   Specimen: BLOOD  Result Value Ref Range Status   Specimen Description   Final    BLOOD LEFT ANTECUBITAL Performed at [facility] [address], [place], [place] [address]    Special Requests   Final    BOTTLES DRAWN AEROBIC AND ANAEROBIC Blood Culture adequate volume Performed at [facility] [address], [place], [place] [address]    Culture  Setup Time   Final    GRAM POSITIVE RODS AEROBIC BOTTLE ONLY CRITICAL RESULT CALLED TO, READ BACK BY AND VERIFIED WITH: E JACKSON,PHARMD'@0200'$  07/07/22 [place]    Culture   Final    TURICELLA OTITIDIS Standardized susceptibility testing for this organism is not available. Performed at [facility], [place] [address] [place], [place] [address]    Report Status 07/09/2022 FINAL  Final  Blood Culture (routine x 2)     Status: None   Collection Time: 07/06/22  3:29 AM   Specimen: BLOOD  Result Value Ref Range Status   Specimen Description   Final    BLOOD BLOOD RIGHT HAND Performed at [facility] [address],  [place], [place] [address]    Special Requests   Final    BOTTLES DRAWN AEROBIC ONLY Blood Culture adequate volume Performed at [facility] [address], [place], [place] [address]    Culture   Final    NO GROWTH 5 DAYS Performed at [facility], [place] [address], [place], [place] [address]    Report Status 07/11/2022 FINAL  Final  Urine Culture     Status: None   Collection Time: 07/06/22  2:00 PM   Specimen: Urine, Clean Catch  Result Value Ref Range Status   Specimen Description   Final    URINE, CLEAN CATCH Performed at [facility], [place] [address] [place], [place] [address]    Special Requests   Final    NONE Performed at [facility], [place] [address] [place], [place] [address]    Culture   Final    NO GROWTH Performed at [facility], [place] [address], [place], [place] [address]    Report Status 07/07/2022 FINAL  Final    Radiology Studies: DG CHEST PORT 1 VIEW  Result Date: 07/10/2022 CLINICAL DATA:  10026; shortness of breath EXAM: PORTABLE CHEST 1 VIEW COMPARISON:  July 05, 2022 FINDINGS: The heart size and mediastinal contours are within normal limits. Stable atheromatous calcifications of the arch of the aorta. Both lungs are clear and stable. The visualized skeletal structures are unremarkable. Again seen are the ACDF changes of the  lower cervical spine. IMPRESSION: No active cardiopulmonary disease. Electronically Signed   By: Frazier Richards M.D.   On: 07/10/2022 13:18    Scheduled Meds:  acetaminophen  1,000 mg Oral TID   aspirin EC  81 mg Oral Daily   atorvastatin  40 mg Oral Daily   docusate sodium  100 mg Oral BID   enoxaparin (LOVENOX) injection  40 mg Subcutaneous Q24H   escitalopram  20 mg Oral Daily   insulin aspart  0-15 Units Subcutaneous TID WC   insulin aspart  0-5 Units Subcutaneous QHS   lip balm   Topical BID   metoprolol succinate  25 mg Oral BID   ondansetron  4 mg Oral Q12H    Or   ondansetron (ZOFRAN) IV  4 mg Intravenous Q12H   pantoprazole  20 mg Oral Daily   polycarbophil  625 mg Oral BID   polyethylene glycol  17 g Oral BID   tamsulosin  0.4 mg Oral Daily   Continuous Infusions:   LOS: 5 days   Raiford Noble, DO [facility] Available via Epic secure chat 7am-7pm After these hours, please refer to coverage provider listed on [email or web address] 07/12/2022, 12:49 PM

## 2022-07-13 DIAGNOSIS — N1831 Chronic kidney disease, stage 3a: Secondary | ICD-10-CM | POA: Diagnosis not present

## 2022-07-13 DIAGNOSIS — R7989 Other specified abnormal findings of blood chemistry: Secondary | ICD-10-CM | POA: Diagnosis not present

## 2022-07-13 DIAGNOSIS — K812 Acute cholecystitis with chronic cholecystitis: Secondary | ICD-10-CM | POA: Diagnosis not present

## 2022-07-13 DIAGNOSIS — K8000 Calculus of gallbladder with acute cholecystitis without obstruction: Secondary | ICD-10-CM | POA: Diagnosis not present

## 2022-07-13 LAB — COMPREHENSIVE METABOLIC PANEL
ALT: 51 U/L — ABNORMAL HIGH (ref 0–44)
AST: 25 U/L (ref 15–41)
Albumin: 3.1 g/dL — ABNORMAL LOW (ref 3.5–5.0)
Alkaline Phosphatase: 49 U/L (ref 38–126)
Anion gap: 7 (ref 5–15)
BUN: 17 mg/dL (ref 8–23)
CO2: 28 mmol/L (ref 22–32)
Calcium: 8.7 mg/dL — ABNORMAL LOW (ref 8.9–10.3)
Chloride: 107 mmol/L (ref 98–111)
Creatinine, Ser: 1.17 mg/dL (ref 0.61–1.24)
GFR, Estimated: >60 mL/min (ref >60)
Glucose, Bld: 145 mg/dL — ABNORMAL HIGH (ref 70–99)
Potassium: 3.9 mmol/L (ref 3.5–5.1)
Sodium: 142 mmol/L (ref 135–145)
Total Bilirubin: 0.3 mg/dL (ref 0.3–1.2)
Total Protein: 5.7 g/dL — ABNORMAL LOW (ref 6.5–8.1)

## 2022-07-13 LAB — CBC WITH DIFFERENTIAL/PLATELET
Abs Immature Granulocytes: 0.07 10*3/uL (ref 0.00–0.07)
Basophils Absolute: 0 10*3/uL (ref 0.0–0.1)
Basophils Relative: 0 %
Eosinophils Absolute: 0.4 10*3/uL (ref 0.0–0.5)
Eosinophils Relative: 4 %
HCT: 34.4 % — ABNORMAL LOW (ref 39.0–52.0)
Hemoglobin: 11.7 g/dL — ABNORMAL LOW (ref 13.0–17.0)
Immature Granulocytes: 1 %
Lymphocytes Relative: 12 %
Lymphs Abs: 1.4 10*3/uL (ref 0.7–4.0)
MCH: 32.3 pg (ref 26.0–34.0)
MCHC: 34 g/dL (ref 30.0–36.0)
MCV: 95 fL (ref 80.0–100.0)
Monocytes Absolute: 0.8 10*3/uL (ref 0.1–1.0)
Monocytes Relative: 7 %
Neutro Abs: 8.4 10*3/uL — ABNORMAL HIGH (ref 1.7–7.7)
Neutrophils Relative %: 76 %
Platelets: 214 10*3/uL (ref 150–400)
RBC: 3.62 MIL/uL — ABNORMAL LOW (ref 4.22–5.81)
RDW: 13.6 % (ref 11.5–15.5)
WBC: 11.2 10*3/uL — ABNORMAL HIGH (ref 4.0–10.5)
nRBC: 0 % (ref 0.0–0.2)

## 2022-07-13 LAB — GLUCOSE, CAPILLARY
Glucose-Capillary: 154 mg/dL — ABNORMAL HIGH (ref 70–99)
Glucose-Capillary: 134 mg/dL — ABNORMAL HIGH (ref 70–99)
Glucose-Capillary: 117 mg/dL — ABNORMAL HIGH (ref 70–99)
Glucose-Capillary: 122 mg/dL — ABNORMAL HIGH (ref 70–99)

## 2022-07-13 LAB — PHOSPHORUS: Phosphorus: 3.4 mg/dL (ref 2.5–4.6)

## 2022-07-13 LAB — MAGNESIUM: Magnesium: 2.2 mg/dL (ref 1.7–2.4)

## 2022-07-13 NOTE — Plan of Care (Signed)
  Problem: Clinical Measurements: Goal: Ability to maintain clinical measurements within normal limits will improve Outcome: Progressing   

## 2022-07-13 NOTE — Progress Notes (Signed)
Mobility Specialist - Progress Note    07/13/22 1400  Mobility  HOB Elevated/Bed Position Self regulated  Activity Ambulated with assistance to bathroom  Range of Motion/Exercises Active  Level of Assistance Contact guard assist, steadying assist  Assistive Device Front wheel walker  Distance Ambulated (ft) 45 ft  Activity Response Tolerated well  Transport method Ambulatory  $Mobility charge 1 Mobility   Pt received in bed and agreeable to mobility. Pt ambulated to bathroom. Pt to recliner after session with all needs met.     [facility]

## 2022-07-13 NOTE — Progress Notes (Signed)
Mobility Specialist - Progress Note   07/13/22 1034  Mobility  HOB Elevated/Bed Position Self regulated  Activity Ambulated with assistance in hallway  Range of Motion/Exercises Active  Level of Assistance Contact guard assist, steadying assist  Assistive Device Front wheel walker  Distance Ambulated (ft) 250 ft  Activity Response Tolerated well  Transport method Ambulatory  $Mobility charge 1 Mobility   Pt received in recliner and agreeable to mobility. Ambulated to bathroom for a bowel movement. C/o dizziness during hallway ambulation, one standing rest break was taken where symptoms subsided afterwards. Pt to recliner with all necessities in reach.   [facility]

## 2022-07-13 NOTE — Progress Notes (Signed)
PROGRESS NOTE    GLENWOOD REVOIR  ZOX:[id number] DOB: 1941-03-28 DOA: 07/05/2022 PCP: [name], FNP   Brief Narrative:  Brief Narrative:  The patient is an 81 year old Caucasian male with past medical history significant for but limited to hypertension, hyperlipidemia, GERD, chronic kidney disease stage IIIa, as well as other comorbidities who was admitted to the hospital under surgical service for acute on chronic cholecystitis and underwent cholecystectomy.  Postoperatively patient ended up having a bile leak and hospitalist service was consulted to help address his medical issues.  He has been consulted for his subsequent bile leak and he has a percutaneous biliary drain in place.  He continues to have significant discomfort and is on IV cefepime and Flagyl and the plan is for ERCP and patient underwent biliary stenting with a plastic stent to help seal the bile leak.   Today he continues to have some abdominal discomfort and increased nausea as well as some slight shortness of breath.  Gastroenterology is recommending scheduled Zofran and Compazine as needed and they are going to advance his diet to full liquid diet and advance as tolerated.  There are planning ERCP biliary stent removal within 3 months. SLP evaluated and he was a mild aspiration risk.   Patient remains little bit nauseous and continues to have some abdominal discomfort.  Per general surgery since he continues to be nauseous and pain is still there, they are going to repeat a CT scan.  Repeat CT scan showed a small amount of fluid and gas in the gallbladder fossa but his WBC is trending down and diet has been advanced.  General surgery feels that he may be able to be discharged as soon as tomorrow  Assessment and Plan:  Acute on chronic cholecystitis with complication of a bile leak Abnormal LFTs Abdominal Pain -Status post cholecystectomy with drain placement: General surgery recommending continuing drain  today -Currently on IV antibiotics with cefepime and metronidazole which we will continue but neurosurgery stopping today -Patient underwent a nuclear medicine HIDA scan which was concerning for a leak and general surgery is not a surprise given his severe cholecystitis and difficult subtotal cholecystectomy and have asked GI to evaluate for ERCP and stenting and plan is for ERCP with likely biliary stent and decompress the bile duct to help the bile leak still.  Patient is status post plastic stenting of his biliary duct and is postoperative day 2 and he continues to have some abdominal discomfort and nausea -GI is following and recommending a full liquid diet and advancing as tolerated and have scheduled Zofran and added as needed Compazine -Lipase is being checked -AST went from 39 -> 29 -> 43 -> 27 -> 20 -ALT went from 180 -> 128 -> 102 -> 77 -> 59 -Plan is for ERCP today as he is NPO and Lovenox held for ERCP; after his ERCP they are recommending a solid diet -Pathology has been benign from his surgery -Postoperative care per general surgery and they are recommending continuing the drain for now -PT/OT recommending Home Health  -GI recommending repeating ERCP as an outpatient with Dr. Henrene Pastor in 3 months with cholangiogram and stent pull -WBC went from 9.1 -> 12.2 -> 11.2 -GI has recommended bowel regimen and have signed off the case now -Given that patient continue to be nauseous and continues to have some abdominal pain he will undergo a repeat CT scan of the abdomen pelvis to ensure that there is no undrained bile -CT Scan of the Abdomen  showed "Postop changes from cholecystectomy. No evidence of biliary ductal dilatation. Small amount of fluid and gas within the gallbladder fossa, which may represent a postop fluid collection or biloma from bile leak. Nuclear medicine hepatobiliary scan could be performed for further evaluation if clinically warranted. New small right pleural effusion and  dependent atelectasis. Colonic diverticulosis, without radiographic evidence of diverticulitis. Small hiatal hernia.Aortic Atherosclerosis." -General surgery is advancing his diet and they may be able to discharge him home tomorrow -Continue to monitor carefully   Dyspnea with cough, improving -States this happened post ERCP and chest x-ray done and showed no acute cardiopulmonary disease -Continue with incentive spirometry and monitoring -SLP shows that he is a minimal aspiration risk -Recommend ambulatory home O2 screen prior to discharge and will order it for the a.m. as he may be discharged   Chronic Kidney Disease Stage IIIa -Baseline creatinine approximately 1.5 -Creatinine appears to be near baseline and now BUNs/creatinine has gone from 19/1.20 -> 17/1.40 -> 21/1.34 -> 18/1.19 -> 21/1.05 -> 17/1.17 -Continue to monitor -Appears that he is chronically on meloxicam as an outpatient.  Would not resume this on discharge -Outpatient Lasix dose currently on hold.  Resume once he has adequate p.o. intake -Avoid further nephrotoxic medications, contrast dyes, hypotension and dehydration to ensure adequate renal perfusion if possible and renally dose medications -Repeat CMP in a.m.   Hyperlipidemia -Continued statin but may need to hold given his abnormal LFTs as AST is now 25 and ALT is 51 -Continue monitor and trend and repeat CMP in the a.m. and if necessary obtain a right upper quadrant ultrasound and acute hepatitis panel   Hypertension -Continued on Toprol -Blood pressure currently stable we will need to monitor blood pressures carefully -Last BP reading was elevated at 145/60 -We will add IV hydralazine 10 mg every 6 as needed for SBP greater than 119 or diastolic blood pressure greater 100   GERD/GI prophylaxis -Continue PPI   Constipation -GI and general surgery following and he is on fiber and MiraLAX   BPH -Continued on Tamsulosin    Hyperglycemia -CBGs ranging from  134-176 -Check A1c and was not elevated and was 5.3   Hypophosphatemia -Patient's Phos level is 3.4 -Continue monitor and trend and repeat Phos level in a.m.   Hypoalbuminemia -Patient's albumin level is 3.0 -> 3.2 -> 3.0 x2 -> 3.2 -> 3.1 -Continue monitor and trend and repeat CMP in a.m.   Normocytic anemia -Patient's hemoglobin/hematocrit is now gone from 11.6/33.6 -> 12.1/35.7 -> 11.7/34.4 with an MCV of 95.0 -Checked Anemia panel and showed an iron level of 62, U IBC 147, TIBC 209, saturation ratio 30%, ferritin level 257, folate of 8.7, vitamin B12 1111 -Continue monitor for signs and symptoms bleeding; no overt bleeding noted -Repeat CBC in a.m.   Thrombocytopenia - Improved as Patient's platelet count has gone from 107 -> 126 -> 160 -> 163 -> 165 -> 209 -Continue monitor and trend and evaluate for signs and symptoms of bleeding and repeat CBC in a.m.  DVT prophylaxis: enoxaparin (LOVENOX) injection 40 mg Start: 07/10/22 1500 SCD's Start: 07/05/22 1507    Code Status: Full Code Family Communication: No family currently at bedside  Disposition Plan:  Level of care: Med-Surg Status is: Inpatient Remains inpatient appropriate because: Needs general surgery clearance and likely can be discharged home tomorrow   Consultants:  General Surgery Gastroenterology [facility]  Procedures:  Procedure: Laparoscopic subtotal fenestrating cholecystectomy with drain placement   ERCP Findings:  The scout film showed a RUQ drain and was otherwise normal. The scope       was advanced to a normal major papilla with a hooded fold in the       descending duodenum. Limited examination of the pharynx, larynx and       associated structures, and upper GI tract showed a mild distal       esophageal stricture and otherwise appeared normal. A straight       Roadrunner wire was passed into the biliary tree. The short-nosed       traction sphincterotome was passed over the guidewire  and the bile duct       was then deeply cannulated. Contrast was injected. I personally       interpreted the bile duct images. There was appropriate flow of contrast       through the ducts. Extravasation of contrast originating from the       gallbladder remnant was observed. A subtotal cholecystectomy had been       performed. The biliary tree otherwise appeared normal. The intrahepatic       ducts were not fully filled with contrast flowing to the leak. A 5 mm       biliary sphincterotomy was made with a traction (standard)       sphincterotome using ERBE electrocautery to facilitate stent placement.       There was no post-sphincterotomy bleeding. One 10 Fr by 5 cm plastic       stent with a single external flap and a single internal flap was placed       4 cm into the common bile duct. Bile flowed through the stent. The stent       was in good position. The PD was not cannulated or injected by intention. Impression:               - A bile leak was found.                           - Prior subtotal cholecystectomy.                           - A biliary sphincterotomy was performed.                           - One plastic stent was placed into the common bile                            duct.                           - Non obstructing distal esophageal stricture. Moderate Sedation:      Not Applicable - Patient had care per Anesthesia. Recommendation:           - Return patient to hospital ward for ongoing care.                           - Avoid aspirin and nonsteroidal anti-inflammatory                            medicines for 1 week.                           -  Observe patient's clinical course following                            today's ERCP with therapeutic intervention.                           - ERCP with biliary stent removal in 3 months with                            Dr. Henrene Pastor.    Antimicrobials:  Anti-infectives (From admission, onward)    Start     Dose/Rate Route  Frequency Ordered Stop   07/06/22 1245  ceFEPIme (MAXIPIME) 2 g in sodium chloride 0.9 % 100 mL IVPB  Status:  Discontinued        2 g 200 mL/hr over 30 Minutes Intravenous Every 12 hours 07/06/22 1158 07/10/22 1345   07/05/22 1215  ceFEPIme (MAXIPIME) 2 g in sodium chloride 0.9 % 100 mL IVPB        2 g 200 mL/hr over 30 Minutes Intravenous  Once 07/05/22 1201 07/05/22 1313   07/05/22 1215  metroNIDAZOLE (FLAGYL) IVPB 500 mg  Status:  Discontinued        500 mg 100 mL/hr over 60 Minutes Intravenous Every 12 hours 07/05/22 1203 07/10/22 1345       Subjective: Seen and examined at bedside and states that his abdominal pain was a little bit better but he is still hurting.  Denies any nausea or lightheadedness or dizziness.  Feels okay.  No other concerns at this time.  Objective: Vitals:   07/12/22 2204 07/12/22 2330 07/13/22 0416 07/13/22 1358  BP: (!) 187/90 (!) 178/77 (!) 153/85 (!) 145/60  Pulse: 67 68 73 70  Resp: '18  18 18  '$ Temp: 98.1 F (36.7 C)  97.9 F (36.6 C) (!) 97.4 F (36.3 C)  TempSrc: Oral  Oral Oral  SpO2: 97%  97% 95%  Weight:      Height:        Intake/Output Summary (Last 24 hours) at 07/13/2022 1648 Last data filed at 07/13/2022 1400 Gross per 24 hour  Intake 840 ml  Output 712 ml  Net 128 ml   Filed Weights   07/08/22 0413 07/09/22 1418 07/10/22 0500  Weight: 95.7 kg 95.7 kg 94.2 kg   Examination: Physical Exam:  Constitutional: WN/WD obese Caucasian male currently no acute distress appears calm Respiratory: Slightly diminished to auscultation bilaterally, no wheezing, rales, rhonchi or crackles. Normal respiratory effort and patient is not tachypenic. No accessory muscle use.  Unlabored breathing Cardiovascular: RRR, no murmurs / rubs / gallops. S1 and S2 auscultated. No extremity edema. Abdomen: Soft, tender to palpate, distended secondary body habitus.  Drain in place bowel sounds positive.  GU: Deferred. Musculoskeletal: No clubbing / cyanosis  of digits/nails. No joint deformity upper and lower extremities.  Neurologic: CN 2-12 grossly intact with no focal deficits. Romberg sign and cerebellar reflexes not assessed.  Psychiatric: Normal judgment and insight. Alert and oriented x 3. Normal mood and appropriate affect.   Data Reviewed: I have personally reviewed following labs and imaging studies  CBC: Recent Labs  Lab 07/09/22 0447 07/10/22 0445 07/11/22 0521 07/12/22 0851 07/13/22 0405  WBC 9.0 8.4 9.1 12.2* 11.2*  NEUTROABS 6.9 6.8 6.8 10.1* 8.4*  HGB 11.7* 11.2* 11.6* 12.1* 11.7*  HCT 33.8* 32.6* 33.6* 35.7* 34.4*  MCV 94.2 93.4 94.4 94.4 95.0  PLT 160 163 165 209 132   Basic Metabolic Panel: Recent Labs  Lab 07/09/22 0447 07/10/22 0445 07/11/22 0521 07/12/22 0439 07/12/22 0851 07/13/22 0405  NA 140 143 141  --  136 142  K 4.1 3.8 3.7  --  3.8 3.9  CL 108 110 106  --  101 107  CO2 '26 26 29  '$ --  28 28  GLUCOSE 123* 125* 111*  --  163* 145*  BUN '17 21 18  '$ --  21 17  CREATININE 1.40* 1.34* 1.19 1.11 1.05 1.17  CALCIUM 8.7* 8.5* 8.4*  --  8.1* 8.7*  MG 2.0 2.2 2.1  --  2.0 2.2  PHOS 2.3* 3.9 3.2  --  3.1 3.4   GFR: Estimated Creatinine Clearance: 56.1 mL/min (by C-G formula based on SCr of 1.17 mg/dL). Liver Function Tests: Recent Labs  Lab 07/09/22 0447 07/10/22 0445 07/11/22 0521 07/12/22 0851 07/13/22 0405  AST 29 43* '27 20 25  '$ ALT 128* 102* 77* 59* 51*  ALKPHOS 50 49 47 52 49  BILITOT 0.8 0.9 0.7 0.6 0.3  PROT 5.9* 5.6* 5.5* 5.9* 5.7*  ALBUMIN 3.2* 3.0* 3.0* 3.2* 3.1*   Recent Labs  Lab 07/10/22 0445  LIPASE 32   No results for input(s): "AMMONIA" in the last 168 hours. Coagulation Profile: No results for input(s): "INR", "PROTIME" in the last 168 hours. Cardiac Enzymes: No results for input(s): "CKTOTAL", "CKMB", "CKMBINDEX", "TROPONINI" in the last 168 hours. BNP (last 3 results) No results for input(s): "PROBNP" in the last 8760 hours. HbA1C: No results for input(s): "HGBA1C" in  the last 72 hours. CBG: Recent Labs  Lab 07/12/22 1212 07/12/22 1644 07/12/22 2208 07/13/22 0736 07/13/22 1145  GLUCAP 134* 176* 139* 154* 134*   Lipid Profile: No results for input(s): "CHOL", "HDL", "LDLCALC", "TRIG", "CHOLHDL", "LDLDIRECT" in the last 72 hours. Thyroid Function Tests: No results for input(s): "TSH", "T4TOTAL", "FREET4", "T3FREE", "THYROIDAB" in the last 72 hours. Anemia Panel: No results for input(s): "VITAMINB12", "FOLATE", "FERRITIN", "TIBC", "IRON", "RETICCTPCT" in the last 72 hours. Sepsis Labs: No results for input(s): "PROCALCITON", "LATICACIDVEN" in the last 168 hours.  Recent Results (from the past 240 hour(s))  Resp Panel by RT-PCR (Flu A&B, Covid)     Status: None   Collection Time: 07/05/22 12:32 PM   Specimen: Nasal Swab  Result Value Ref Range Status   SARS Coronavirus 2 by RT PCR NEGATIVE NEGATIVE Final    Comment: (NOTE) SARS-CoV-2 target nucleic acids are NOT DETECTED.  The SARS-CoV-2 RNA is generally detectable in upper respiratory specimens during the acute phase of infection. The lowest concentration of SARS-CoV-2 viral copies this assay can detect is 138 copies/mL. A negative result does not preclude SARS-Cov-2 infection and should not be used as the sole basis for treatment or other patient management decisions. A negative result may occur with  improper specimen collection/handling, submission of specimen other than nasopharyngeal swab, presence of viral mutation(s) within the areas targeted by this assay, and inadequate number of viral copies(<138 copies/mL). A negative result must be combined with clinical observations, patient history, and epidemiological information. The expected result is Negative.  Fact Sheet for Patients:  EntrepreneurPulse.com.au  Fact Sheet for Healthcare Providers:  IncredibleEmployment.be  This test is no t yet approved or cleared by the [place] FDA and  has  been authorized for detection and/or diagnosis of SARS-CoV-2 by FDA under an Emergency Use Authorization (EUA). This EUA will remain  in effect (meaning this test can be used) for the duration of the COVID-19 declaration under Section 564(b)(1) of the Act, 21 U.S.C.section 360bbb-3(b)(1), unless the authorization is terminated  or revoked sooner.       Influenza A by PCR NEGATIVE NEGATIVE Final   Influenza B by PCR NEGATIVE NEGATIVE Final    Comment: (NOTE) The Xpert Xpress SARS-CoV-2/FLU/RSV plus assay is intended as an aid in the diagnosis of influenza from Nasopharyngeal swab specimens and should not be used as a sole basis for treatment. Nasal washings and aspirates are unacceptable for Xpert Xpress SARS-CoV-2/FLU/RSV testing.  Fact Sheet for Patients: EntrepreneurPulse.com.au  Fact Sheet for Healthcare Providers: IncredibleEmployment.be  This test is not yet approved or cleared by the [place] FDA and has been authorized for detection and/or diagnosis of SARS-CoV-2 by FDA under an Emergency Use Authorization (EUA). This EUA will remain in effect (meaning this test can be used) for the duration of the COVID-19 declaration under Section 564(b)(1) of the Act, 21 U.S.C. section 360bbb-3(b)(1), unless the authorization is terminated or revoked.  Performed at [facility], [place] [address], [place], [place] [address]   Blood Culture (routine x 2)     Status: None   Collection Time: 07/05/22 12:32 PM   Specimen: BLOOD  Result Value Ref Range Status   Specimen Description   Final    BLOOD LEFT ANTECUBITAL Performed at [facility] [address], [place], [place] [address]    Special Requests   Final    BOTTLES DRAWN AEROBIC AND ANAEROBIC Blood Culture adequate volume Performed at [facility] [address] [place], [place] [address]    Culture  Setup Time   Final    GRAM  POSITIVE RODS AEROBIC BOTTLE ONLY CRITICAL RESULT CALLED TO, READ BACK BY AND VERIFIED WITH: E JACKSON,PHARMD'@0200'$  07/07/22 [place]    Culture   Final    TURICELLA OTITIDIS Standardized susceptibility testing for this organism is not available. Performed at [facility], [place] [address] [place], [place] [address]    Report Status 07/09/2022 FINAL  Final  Blood Culture (routine x 2)     Status: None   Collection Time: 07/06/22  3:29 AM   Specimen: BLOOD  Result Value Ref Range Status   Specimen Description   Final    BLOOD BLOOD RIGHT HAND Performed at [facility] [address], [place], [place] [address]    Special Requests   Final    BOTTLES DRAWN AEROBIC ONLY Blood Culture adequate volume Performed at [facility] [address], [place], [place] [address]    Culture   Final    NO GROWTH 5 DAYS Performed at [facility], [place] [address], [place], [place] [address]    Report Status 07/11/2022 FINAL  Final  Urine Culture     Status: None   Collection Time: 07/06/22  2:00 PM   Specimen: Urine, Clean Catch  Result Value Ref Range Status   Specimen Description   Final    URINE, CLEAN CATCH Performed at [facility], [place] [address] [place], [place] [address]    Special Requests   Final    NONE Performed at [facility], [place] [address], [place], [place] [address]    Culture   Final    NO GROWTH Performed at [facility], [place] [address], [place],  [address]    Report Status 07/07/2022 FINAL  Final    Radiology Studies: CT ABDOMEN PELVIS W CONTRAST  Result Date: 07/12/2022 CLINICAL DATA:  One week postop from cholecystectomy. Worsening abdominal pain. EXAM: CT ABDOMEN AND PELVIS WITH CONTRAST TECHNIQUE: Multidetector CT imaging of the abdomen and pelvis was performed using the standard protocol following bolus administration of intravenous contrast. RADIATION DOSE REDUCTION:  This exam was performed according to the departmental dose-optimization program which includes automated exposure control, adjustment of the mA and/or kV according to patient size and/or use of iterative reconstruction technique. CONTRAST:  156m OMNIPAQUE IOHEXOL 300 MG/ML  SOLN COMPARISON:  05/05/2022 FINDINGS: Lower Chest: New small right pleural effusion and dependent atelectasis are seen. Hepatobiliary: Several sub-centimeter low-attenuation lesions in the left hepatic lobe show no significant change, likely representing tiny cysts. Patient has undergone cholecystectomy since prior exam. Surgical drain is seen in the gallbladder fossa and internal biliary stent is also seen in the distal common bile duct. No evidence of biliary ductal dilatation. A small amount of fluid and gas is seen within the cholecystectomy bed. Pancreas:  No mass or inflammatory changes. Spleen: Within normal limits in size and appearance. Adrenals/Urinary Tract: No suspicious masses identified. No evidence of ureteral calculi or hydronephrosis. Stomach/Bowel: Small hiatal hernia again seen. No evidence of obstruction, inflammatory process or abnormal fluid collections. Normal appendix visualized. Colonic diverticulosis is again noted, without evidence of diverticulitis. Vascular/Lymphatic: No pathologically enlarged lymph nodes. No acute vascular findings. Aortic atherosclerotic calcification incidentally noted. Reproductive:  No mass or other significant abnormality. Other:  None. Musculoskeletal:  No suspicious bone lesions identified. IMPRESSION: Postop changes from cholecystectomy. No evidence of biliary ductal dilatation. Small amount of fluid and gas within the gallbladder fossa, which may represent a postop fluid collection or biloma from bile leak. Nuclear medicine hepatobiliary scan could be performed for further evaluation if clinically warranted. New small right pleural effusion and dependent atelectasis. Colonic  diverticulosis, without radiographic evidence of diverticulitis. Small hiatal hernia. Aortic Atherosclerosis (ICD10-I70.0). Electronically Signed   By: JMarlaine HindM.D.   On: 07/12/2022 15:07    Scheduled Meds:  acetaminophen  1,000 mg Oral TID   aspirin EC  81 mg Oral Daily   atorvastatin  40 mg Oral Daily   docusate sodium  100 mg Oral BID   enoxaparin (LOVENOX) injection  40 mg Subcutaneous Q24H   escitalopram  20 mg Oral Daily   insulin aspart  0-15 Units Subcutaneous TID WC   insulin aspart  0-5 Units Subcutaneous QHS   lip balm   Topical BID   metoprolol succinate  25 mg Oral BID   ondansetron  4 mg Oral Q12H   Or   ondansetron (ZOFRAN) IV  4 mg Intravenous Q12H   pantoprazole  20 mg Oral Daily   polycarbophil  625 mg Oral BID   polyethylene glycol  17 g Oral BID   tamsulosin  0.4 mg Oral Daily    LOS: 6 days   ORaiford Noble DO [facility] Available via Epic secure chat 7am-7pm After these hours, please refer to coverage provider listed on [email or web address] 07/13/2022, 4:48 PM

## 2022-07-13 NOTE — Progress Notes (Signed)
[facility] Progress Note  4 Days Post-Op  Subjective: CC-  Up in chair, about to go on a walk. Tolerated a puree diet this morning without any nausea. Passing flatus, feels like he is going to have a bowel movement. Continues to have some RUQ discomfort, but states it is better than 2 days ago. WBC down 11.2, afebrile  Objective: Vital signs in last 24 hours: Temp:  [97.9 F (36.6 C)-98.1 F (36.7 C)] 97.9 F (36.6 C) (08/28 0416) Pulse Rate:  [66-73] 73 (08/28 0416) Resp:  [17-18] 18 (08/28 0416) BP: (127-187)/(70-90) 153/85 (08/28 0416) SpO2:  [97 %-99 %] 97 % (08/28 0416) Last BM Date : 07/12/22  Intake/Output from previous day: 08/27 0701 - 08/28 0700 In: 2180 [P.O.:2180] Out: 970 [Urine:950; Drains:20] Intake/Output this shift: Total I/O In: 240 [P.O.:240] Out: -   PE: Gen:  Alert, NAD, pleasant Abd: soft, mild distension, +BS, mild RUQ TTP without peritonitis, incisions cdi, JP drain is empty  Lab Results:  Recent Labs    07/12/22 0851 07/13/22 0405  WBC 12.2* 11.2*  HGB 12.1* 11.7*  HCT 35.7* 34.4*  PLT 209 214   BMET Recent Labs    07/12/22 0851 07/13/22 0405  NA 136 142  K 3.8 3.9  CL 101 107  CO2 28 28  GLUCOSE 163* 145*  BUN 21 17  CREATININE 1.05 1.17  CALCIUM 8.1* 8.7*   PT/INR No results for input(s): "LABPROT", "INR" in the last 72 hours. CMP     Component Value Date/Time   NA 142 07/13/2022 0405   NA 141 11/21/2021 1225   K 3.9 07/13/2022 0405   CL 107 07/13/2022 0405   CO2 28 07/13/2022 0405   GLUCOSE 145 (H) 07/13/2022 0405   BUN 17 07/13/2022 0405   BUN 22 11/21/2021 1225   CREATININE 1.17 07/13/2022 0405   CREATININE 0.86 04/12/2013 1145   CALCIUM 8.7 (L) 07/13/2022 0405   PROT 5.7 (L) 07/13/2022 0405   PROT 6.2 11/21/2021 1225   ALBUMIN 3.1 (L) 07/13/2022 0405   ALBUMIN 4.4 11/21/2021 1225   AST 25 07/13/2022 0405   ALT 51 (H) 07/13/2022 0405   ALKPHOS 49 07/13/2022 0405   BILITOT 0.3 07/13/2022 0405    BILITOT 0.6 11/21/2021 1225   GFRNONAA >60 07/13/2022 0405   GFRNONAA 87 04/12/2013 1145   GFRAA 57 (L) 10/08/2020 1346   GFRAA >89 04/12/2013 1145   Lipase     Component Value Date/Time   LIPASE 32 07/10/2022 0445       Studies/Results: CT ABDOMEN PELVIS W CONTRAST  Result Date: 07/12/2022 CLINICAL DATA:  One week postop from cholecystectomy. Worsening abdominal pain. EXAM: CT ABDOMEN AND PELVIS WITH CONTRAST TECHNIQUE: Multidetector CT imaging of the abdomen and pelvis was performed using the standard protocol following bolus administration of intravenous contrast. RADIATION DOSE REDUCTION: This exam was performed according to the departmental dose-optimization program which includes automated exposure control, adjustment of the mA and/or kV according to [name] size and/or use of iterative reconstruction technique. CONTRAST:  141m OMNIPAQUE IOHEXOL 300 MG/ML  SOLN COMPARISON:  05/05/2022 FINDINGS: Lower Chest: New small right pleural effusion and dependent atelectasis are seen. Hepatobiliary: Several sub-centimeter low-attenuation lesions in the left hepatic lobe show no significant change, likely representing tiny cysts. [name] has undergone cholecystectomy since prior exam. Surgical drain is seen in the gallbladder fossa and internal biliary stent is also seen in the distal common bile duct. No evidence of biliary ductal dilatation. A small amount of  fluid and gas is seen within the cholecystectomy bed. Pancreas:  No mass or inflammatory changes. Spleen: Within normal limits in size and appearance. Adrenals/Urinary Tract: No suspicious masses identified. No evidence of ureteral calculi or hydronephrosis. Stomach/Bowel: Small hiatal hernia again seen. No evidence of obstruction, inflammatory process or abnormal fluid collections. Normal appendix visualized. Colonic diverticulosis is again noted, without evidence of diverticulitis. Vascular/Lymphatic: No pathologically enlarged lymph nodes.  No acute vascular findings. Aortic atherosclerotic calcification incidentally noted. Reproductive:  No mass or other significant abnormality. Other:  None. Musculoskeletal:  No suspicious bone lesions identified. IMPRESSION: Postop changes from cholecystectomy. No evidence of biliary ductal dilatation. Small amount of fluid and gas within the gallbladder fossa, which may represent a postop fluid collection or biloma from bile leak. Nuclear medicine hepatobiliary scan could be performed for further evaluation if clinically warranted. New small right pleural effusion and dependent atelectasis. Colonic diverticulosis, without radiographic evidence of diverticulitis. Small hiatal hernia. Aortic Atherosclerosis (ICD10-I70.0). Electronically Signed   By: Marlaine Hind M.D.   On: 07/12/2022 15:07    Anti-infectives: Anti-infectives (From admission, onward)    Start     Dose/Rate Route Frequency Ordered Stop   07/06/22 1245  ceFEPIme (MAXIPIME) 2 g in sodium chloride 0.9 % 100 mL IVPB  Status:  Discontinued        2 g 200 mL/hr over 30 Minutes Intravenous Every 12 hours 07/06/22 1158 07/10/22 1345   07/05/22 1215  ceFEPIme (MAXIPIME) 2 g in sodium chloride 0.9 % 100 mL IVPB        2 g 200 mL/hr over 30 Minutes Intravenous  Once 07/05/22 1201 07/05/22 1313   07/05/22 1215  metroNIDAZOLE (FLAGYL) IVPB 500 mg  Status:  Discontinued        500 mg 100 mL/hr over 60 Minutes Intravenous Every 12 hours 07/05/22 1203 07/10/22 1345        Assessment/Plan POD #8 s/p laparoscopic subtotal fenestrated cholecystectomy with drain placement Dr. Zenia [name] 07/05/2022 - HIDA positive for bile leak and underwent ERCP 8/24 by Dr. Fuller Plan - CT obtained yesterday for leukocytosis: small amount of fluid and gas in the gallbladder fossa - Continue drain for now - WBC trending down today and he is afebrile.  - Advance to carb mod diet.  - Mobilize with PT/OT, recommending home health - Called and discussed with daughter,  Francisco [name]. Planning to stay with daughter, Francisco [name], upon discharge. Another daughter as well as other family members will be available to help. He may be ready as early as tomorrow.   ID: Maxipime/flagyl 8/20>>8/25 FEN: CM diet VTE: Lovenox   GERD -Protonix Anemia-stable CKD  HLD Hypertension -Home medications GAD -Home medications    LOS: 6 days    Wellington Hampshire, [facility] 07/13/2022, 10:40 AM Please see Amion for pager number during day hours 7:00am-4:30pm

## 2022-07-13 NOTE — Progress Notes (Signed)
Physical Therapy Treatment Patient Details Name: Francisco [name] MRN: [id number] DOB: 08-30-1941 Today's Date: 07/13/2022   History of Present Illness Pt admitted with abdominal pain and now s/p lap chole 07/05/22 and with hx of back surgery    PT Comments    Pt progressing toward PT goals. Improving gait stability however pt does not feel comfortable attempting amb without RW.  Pt will benefit from HHPT, plans to go home with his dtr.  Recommendations for follow up therapy are one component of a multi-disciplinary discharge planning process, led by the attending physician.  Recommendations may be updated based on patient status, additional functional criteria and insurance authorization.  Follow Up Recommendations  Home health PT     Assistance Recommended at Discharge Frequent or constant Supervision/Assistance  Patient can return home with the following A little help with walking and/or transfers;A little help with bathing/dressing/bathroom;Assist for transportation;Help with stairs or ramp for entrance;Assistance with cooking/housework   Equipment Recommendations  None recommended by PT    Recommendations for Other Services       Precautions / Restrictions Precautions Precautions: Fall Restrictions Weight Bearing Restrictions: No     Mobility  Bed Mobility   Bed Mobility: Sidelying to Sit, Sit to Supine, Rolling Rolling: Supervision, Min guard Sidelying to sit: Min guard   Sit to supine: Min guard   General bed mobility comments: Increased time with min/guard for  safety to elevate trunk and lift LEs on to bed.  No physical assist    Transfers   Equipment used: Rolling walker (2 wheels) Transfers: Sit to/from Stand Sit to Stand: Min guard, Supervision           General transfer comment: for safety only; no physical assist    Ambulation/Gait Ambulation/Gait assistance: Min guard, Supervision Gait Distance (Feet): 380 Feet Assistive device: Rolling walker  (2 wheels) Gait Pattern/deviations: Step-through pattern, Decreased stride length       General Gait Details: min cues for posture and position from RW; Pt declines amb sans walker. slight dirfiting however no overt LOB, able to negotiate multiple obstacles in hallway with occasional cues for safety and RW direction   Stairs             Wheelchair Mobility    Modified Rankin (Stroke Patients Only)       Balance   Sitting-balance support: Feet supported, No upper extremity supported Sitting balance-Leahy Scale: Good     Standing balance support: No upper extremity supported, During functional activity Standing balance-Leahy Scale: Fair                              Cognition Arousal/Alertness: Awake/alert Behavior During Therapy: WFL for tasks assessed/performed Overall Cognitive Status: Within Functional Limits for tasks assessed                                          Exercises      General Comments        Pertinent Vitals/Pain Pain Assessment Pain Assessment: Faces Faces Pain Scale: Hurts a little bit Pain Location: abd adn back with movement Pain Descriptors / Indicators: Discomfort Pain Intervention(s): Limited activity within patient's tolerance, Monitored during session    Home Living  Prior Function            PT Goals (current goals can now be found in the care plan section) Acute Rehab PT Goals Patient Stated Goal: Regain IND PT Goal Formulation: With patient Time For Goal Achievement: 07/22/22 Potential to Achieve Goals: Good Progress towards PT goals: Progressing toward goals    Frequency    Min 3X/week      PT Plan Current plan remains appropriate    Co-evaluation              AM-PAC PT "6 Clicks" Mobility   Outcome Measure  Help needed turning from your back to your side while in a flat bed without using bedrails?: None Help needed moving from lying  on your back to sitting on the side of a flat bed without using bedrails?: A Little Help needed moving to and from a bed to a chair (including a wheelchair)?: A Little Help needed standing up from a chair using your arms (e.g., wheelchair or bedside chair)?: A Little Help needed to walk in hospital room?: A Little Help needed climbing 3-5 steps with a railing? : A Little 6 Click Score: 19    End of Session Equipment Utilized During Treatment: Gait belt Activity Tolerance: Patient tolerated treatment well Patient left: in bed;with call bell/phone within reach;with bed alarm set   PT Visit Diagnosis: Unsteadiness on feet (R26.81)     Time: 1510-1531 PT Time Calculation (min) (ACUTE ONLY): 21 min  Charges:  $Gait Training: 8-22 mins                     Baxter Flattery, PT  Acute Rehab Dept [facility]) [phone]  [facility] Weekend Pager (Saturday/Sunday only)  [phone]  07/13/2022    [facility] 07/13/2022, 3:39 PM

## 2022-07-13 NOTE — Care Management Important Message (Signed)
Important Message  Patient Details IM Letter given to the Patient. Name: Francisco [name] MRN: [id number] Date of Birth: November 27, 1940   Medicare Important Message Given:  Yes     Kerin Salen 07/13/2022, 2:50 PM

## 2022-07-13 NOTE — Telephone Encounter (Signed)
Rx refill sent to pharmacy. 

## 2022-07-14 DIAGNOSIS — N1831 Chronic kidney disease, stage 3a: Secondary | ICD-10-CM | POA: Diagnosis not present

## 2022-07-14 DIAGNOSIS — R7989 Other specified abnormal findings of blood chemistry: Secondary | ICD-10-CM | POA: Diagnosis not present

## 2022-07-14 DIAGNOSIS — K812 Acute cholecystitis with chronic cholecystitis: Secondary | ICD-10-CM | POA: Diagnosis not present

## 2022-07-14 DIAGNOSIS — K8000 Calculus of gallbladder with acute cholecystitis without obstruction: Secondary | ICD-10-CM | POA: Diagnosis not present

## 2022-07-14 LAB — CBC WITH DIFFERENTIAL/PLATELET
Abs Immature Granulocytes: 0.07 10*3/uL (ref 0.00–0.07)
Basophils Absolute: 0 10*3/uL (ref 0.0–0.1)
Basophils Relative: 0 %
Eosinophils Absolute: 0.5 10*3/uL (ref 0.0–0.5)
Eosinophils Relative: 4 %
HCT: 33.8 % — ABNORMAL LOW (ref 39.0–52.0)
Hemoglobin: 11.3 g/dL — ABNORMAL LOW (ref 13.0–17.0)
Immature Granulocytes: 1 %
Lymphocytes Relative: 10 %
Lymphs Abs: 1.1 10*3/uL (ref 0.7–4.0)
MCH: 32.4 pg (ref 26.0–34.0)
MCHC: 33.4 g/dL (ref 30.0–36.0)
MCV: 96.8 fL (ref 80.0–100.0)
Monocytes Absolute: 0.7 10*3/uL (ref 0.1–1.0)
Monocytes Relative: 6 %
Neutro Abs: 8.4 10*3/uL — ABNORMAL HIGH (ref 1.7–7.7)
Neutrophils Relative %: 79 %
Platelets: 216 10*3/uL (ref 150–400)
RBC: 3.49 MIL/uL — ABNORMAL LOW (ref 4.22–5.81)
RDW: 13.6 % (ref 11.5–15.5)
WBC: 10.7 10*3/uL — ABNORMAL HIGH (ref 4.0–10.5)
nRBC: 0 % (ref 0.0–0.2)

## 2022-07-14 LAB — PHOSPHORUS: Phosphorus: 3.7 mg/dL (ref 2.5–4.6)

## 2022-07-14 LAB — COMPREHENSIVE METABOLIC PANEL
ALT: 48 U/L — ABNORMAL HIGH (ref 0–44)
AST: 26 U/L (ref 15–41)
Albumin: 3.1 g/dL — ABNORMAL LOW (ref 3.5–5.0)
Alkaline Phosphatase: 48 U/L (ref 38–126)
Anion gap: 6 (ref 5–15)
BUN: 18 mg/dL (ref 8–23)
CO2: 30 mmol/L (ref 22–32)
Calcium: 8.7 mg/dL — ABNORMAL LOW (ref 8.9–10.3)
Chloride: 107 mmol/L (ref 98–111)
Creatinine, Ser: 1.24 mg/dL (ref 0.61–1.24)
GFR, Estimated: 58 mL/min — ABNORMAL LOW (ref >60)
Glucose, Bld: 143 mg/dL — ABNORMAL HIGH (ref 70–99)
Potassium: 3.9 mmol/L (ref 3.5–5.1)
Sodium: 143 mmol/L (ref 135–145)
Total Bilirubin: 0.6 mg/dL (ref 0.3–1.2)
Total Protein: 5.6 g/dL — ABNORMAL LOW (ref 6.5–8.1)

## 2022-07-14 LAB — GLUCOSE, CAPILLARY
Glucose-Capillary: 130 mg/dL — ABNORMAL HIGH (ref 70–99)
Glucose-Capillary: 107 mg/dL — ABNORMAL HIGH (ref 70–99)

## 2022-07-14 LAB — PREALBUMIN: Prealbumin: 18 mg/dL (ref 18–38)

## 2022-07-14 LAB — MAGNESIUM: Magnesium: 2.1 mg/dL (ref 1.7–2.4)

## 2022-07-14 MED ORDER — OXYCODONE HCL 5 MG PO TABS: 5.0000 mg | ORAL_TABLET | Freq: Four times a day (QID) | ORAL | 0 refills | Status: DC | PRN

## 2022-07-14 MED ORDER — DOCUSATE SODIUM 100 MG PO CAPS: 100.0000 mg | ORAL_CAPSULE | Freq: Two times a day (BID) | ORAL | 0 refills | Status: DC

## 2022-07-14 MED ORDER — POLYETHYLENE GLYCOL 3350 17 G PO PACK: 17.0000 g | PACK | Freq: Two times a day (BID) | ORAL | 0 refills | Status: DC

## 2022-07-14 NOTE — Discharge Instructions (Signed)
[facility]  Please arrive at least 30 min before your appointment to complete your check in paperwork.  If you are unable to arrive 30 min prior to your appointment time we may have to cancel or reschedule you. LAPAROSCOPIC SURGERY: POST OP INSTRUCTIONS Always review your discharge instruction sheet given to you by the facility where your surgery was performed. IF YOU HAVE DISABILITY OR FAMILY LEAVE FORMS, YOU MUST BRING THEM TO THE OFFICE FOR PROCESSING.   DO NOT GIVE THEM TO YOUR DOCTOR.  PAIN CONTROL  First take acetaminophen (Tylenol) AND/or ibuprofen (Advil) to control your pain after surgery.  Follow directions on package.  Taking acetaminophen (Tylenol) and/or ibuprofen (Advil) regularly after surgery will help to control your pain and lower the amount of prescription pain medication you may need.  You should not take more than 4,000 mg (4 grams) of acetaminophen (Tylenol) in 24 hours.  You should not take ibuprofen (Advil), aleve, motrin, naprosyn or other NSAIDS if you have a history of stomach ulcers or chronic kidney disease.  A prescription for pain medication may be given to you upon discharge.  Take your pain medication as prescribed, if you still have uncontrolled pain after taking acetaminophen (Tylenol) or ibuprofen (Advil). Use ice packs to help control pain. If you need a refill on your pain medication, please contact your pharmacy.  They will contact our office to request authorization. Prescriptions will not be filled after 5pm or on week-ends.  HOME MEDICATIONS Take your usually prescribed medications unless otherwise directed.  DIET You should follow a light diet the first few days after arrival home.  Be sure to include lots of fluids daily. Avoid fatty, fried foods.   CONSTIPATION It is common to experience some constipation after surgery and if you are taking pain medication.  Increasing fluid intake and taking a stool softener (such as Colace)  will usually help or prevent this problem from occurring.  A mild laxative (Milk of Magnesia or Miralax) should be taken according to package instructions if there are no bowel movements after 48 hours.  WOUND/INCISION CARE Most patients will experience some swelling and bruising in the area of the incisions.  Ice packs will help.  Swelling and bruising can take several days to resolve.  Unless discharge instructions indicate otherwise, follow guidelines below  STERI-STRIPS - you may remove your outer bandages 48 hours after surgery, and you may shower at that time.  You have steri-strips (small skin tapes) in place directly over the incision.  These strips should be left on the skin for 7-10 days.   DERMABOND/SKIN GLUE - you may shower in 24 hours.  The glue will flake off over the next 2-3 weeks. Any sutures or staples will be removed at the office during your follow-up visit.  ACTIVITIES You may resume regular (light) daily activities beginning the next day--such as daily self-care, walking, climbing stairs--gradually increasing activities as tolerated.  You may have sexual intercourse when it is comfortable.  Refrain from any heavy lifting or straining until approved by your doctor. You may drive when you are no longer taking prescription pain medication, you can comfortably wear a seatbelt, and you can safely maneuver your car and apply brakes.  FOLLOW-UP You should see your doctor in the office for a follow-up appointment approximately 2-3 weeks after your surgery.  You should have been given your post-op/follow-up appointment when your surgery was scheduled.  If you did not receive a post-op/follow-up appointment, make sure   that you call for this appointment within a day or two after you arrive home to insure a convenient appointment time.  OTHER INSTRUCTIONS  WHEN TO CALL YOUR DOCTOR: Fever over 101.0 Inability to urinate Continued bleeding from incision. Increased pain, redness, or  drainage from the incision. Increasing abdominal pain  The clinic staff is available to answer your questions during regular business hours.  Please don't hesitate to call and ask to speak to one of the nurses for clinical concerns.  If you have a medical emergency, go to the nearest emergency room or call 911.  A surgeon from [facility] is always on call at the hospital. [address], [address], [place], [place]  [address] ? [address] [address], [place], [place]   [address] [phone] ? [phone] ? [phone] [phone]   

## 2022-07-14 NOTE — Progress Notes (Signed)
SATURATION QUALIFICATIONS: (This note is used to comply with regulatory documentation for home oxygen)  Patient Saturations on Room Air at Rest = 100%  Patient Saturations on Room Air while Ambulating = 94%  Patient Saturations on 2 Liters of oxygen while Ambulating = 100%  Please briefly explain why patient needs home oxygen: Patient does not qualify for home o2

## 2022-07-14 NOTE — Plan of Care (Signed)

## 2022-07-14 NOTE — Progress Notes (Signed)
24 hour chart audit completed 

## 2022-07-14 NOTE — Discharge Summary (Signed)
[facility] Discharge Summary   Patient ID: AUNDREY ELAHI MRN: [id number] DOB/AGE: 81-Feb-1942 81 y.o.  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 Worsening abdominal pain. EXAM: CT ABDOMEN AND PELVIS WITH CONTRAST TECHNIQUE: Multidetector CT imaging of the abdomen and pelvis was performed using the standard protocol following bolus administration of intravenous contrast. RADIATION DOSE REDUCTION: This exam was performed according to the departmental dose-optimization program which includes automated exposure control, adjustment of the mA and/or kV according to patient size and/or use of iterative reconstruction technique. CONTRAST:  118m OMNIPAQUE IOHEXOL 300 MG/ML  SOLN COMPARISON:  05/05/2022 FINDINGS: Lower Chest: New small right pleural effusion and dependent atelectasis are seen. Hepatobiliary: Several sub-centimeter low-attenuation lesions in the  left hepatic lobe show no significant change, likely representing tiny cysts. Patient has undergone cholecystectomy since prior exam. Surgical drain is seen in the gallbladder fossa and internal biliary stent is also seen in the distal common bile duct. No evidence of biliary ductal dilatation. A small amount of fluid and gas is seen within the cholecystectomy bed. Pancreas:  No mass or inflammatory changes. Spleen: Within normal limits in size and appearance. Adrenals/Urinary Tract: No suspicious masses identified. No evidence of ureteral calculi or hydronephrosis. Stomach/Bowel: Small hiatal hernia again seen. No evidence of obstruction, inflammatory process or abnormal fluid collections. Normal appendix visualized. Colonic diverticulosis is again noted, without evidence of diverticulitis. Vascular/Lymphatic: No pathologically enlarged lymph nodes. No acute vascular findings. Aortic atherosclerotic calcification incidentally noted. Reproductive:  No mass or other significant abnormality. Other:  None. Musculoskeletal:  No suspicious bone lesions identified. IMPRESSION: Postop changes from cholecystectomy. No evidence of biliary ductal dilatation. Small amount of fluid and gas within the gallbladder fossa, which may represent a postop fluid collection or biloma from bile leak. Nuclear medicine hepatobiliary scan could be performed for further evaluation if clinically warranted. New small right pleural effusion and dependent atelectasis. Colonic diverticulosis, without radiographic evidence of diverticulitis. Small hiatal hernia. Aortic Atherosclerosis (ICD10-I70.0). Electronically Signed   By: JMarlaine HindM.D.   On: 07/12/2022 15:07    Procedures Dr. AZenia Resides(07/05/2022) - Laparoscopic subtotal fenestrating cholecystectomy with drain placement  Dr. SFuller Plan(07/09/22) - ERCP  Hospital Course:  CBERYL BALZis a 81y.o. male PMH CKD, HLD, HTN, GAD who [facility] RUQ abdominal pain, nausea, and  vomiting.  Workup included u/s which showed multiple large gallstones without signs of acute cholecystitis.  Patient was admitted and taken to the OR. Intraoperatively he was found to have Acute on chronic cholecystitis. He required a laparoscopic  subtotal fenestrating cholecystectomy with drain placement.  Tolerated procedure well and was transferred to the floor.  Hospitalist was consulted postoperatively for assistance with management of his multiple medical issues. On 8/22 he had a HIDA that was concerning for bile leak. Gastroenterology was consulted and took the patient for ERCP 8/24 and placed one plastic stent into the common bile duct. Patient tolerated this procedure well. On POD9, the patient was voiding well, tolerating diet, ambulating well, pain well controlled, vital signs stable, incisions c/d/i and felt stable for discharge home. He will follow up in our office in 1 week for drain check.  Patient will follow up as below and knows to call with questions or concerns.      Allergies as of 07/14/2022       Reactions   Crestor [rosuvastatin] Other (See Comments)   Unknown per Pt   Lipitor [atorvastatin] Other (See Comments)   Unknown per Pt   Penicillins    REACTION: swelling/hives Has patient had a PCN reaction causing immediate rash, facial/tongue/throat swelling, SOB or lightheadedness with hypotension:yes Has patient had a PCN reaction causing severe rash involving mucus membranes or skin necrosis: Yes Has patient had a PCN reaction that required hospitalization No Has patient had a PCN reaction occurring within the last 10 years: No If all of the above answers are "NO", then may proceed with Cephalosporin use.   Symbicort [budesonide-formoterol Fumarate] Other (See Comments)   Pain Behind ribs Unknown per Pt        Medication List     STOP taking these medications    meloxicam 7.5 MG tablet Commonly known as: MOBIC       TAKE these medications    acetaminophen  325 MG tablet Commonly known as: TYLENOL Take 650 mg by mouth every 6 (six) hours as needed for mild pain or fever.   ALPRAZolam 1 MG tablet Commonly known as: XANAX TAKE 1 TABLET BY MOUTH TWICE  DAILY AS NEEDED FOR ANXIETY OR  SLEEP What changed: See the new instructions.   aspirin 81 MG tablet Take 81 mg by mouth daily.   docusate sodium 100 MG capsule Commonly known as: COLACE Take 1 capsule (100 mg total) by mouth 2 (two) times daily.   escitalopram 10 MG tablet Commonly known as: LEXAPRO TAKE 2 TABLETS BY MOUTH DAILY What changed: how much to take   fish oil-omega-3 fatty acids 1000 MG capsule Take 2 g by mouth daily.   furosemide 20 MG tablet Commonly known as: LASIX TAKE 1 TABLET BY MOUTH DAILY   meclizine 25 MG tablet Commonly known as: ANTIVERT TAKE 1 TABLET BY MOUTH 3  TIMES DAILY AS NEEDED FOR  DIZZINESS What changed: See the new instructions.   metoprolol succinate 25 MG 24 hr tablet Commonly known as: TOPROL-XL TAKE 1 TABLET BY MOUTH TWICE  DAILY   oxyCODONE 5 MG immediate release tablet Commonly known as: Oxy IR/ROXICODONE Take 1 tablet (5 mg total) by mouth every 6 (six) hours as needed for moderate pain or severe pain.   pantoprazole 20 MG tablet Commonly known as: PROTONIX TAKE 1 TABLET BY MOUTH DAILY   polyethylene glycol 17 g packet Commonly known as: MIRALAX / GLYCOLAX Take 17 g by mouth 2 (two) times daily.   simvastatin 80 MG tablet Commonly known as: Zocor Take 1 tablet (80 mg total) by mouth daily.   tamsulosin 0.4 MG Caps capsule Commonly known as: FLOMAX TAKE 1 CAPSULE BY MOUTH DAILY  Follow-up Information     [facility] Follow up.   Specialty: Home Health Services Why: Alvis Lemmings will provide PT in the home after discharge. Contact information: [address] [place] [address] [phone]         Dwan Bolt, MD. Go on 07/21/2022.   Specialty: General Surgery Why: Your  appointment is 9/5 at 9:20am Arrive 52mn early to check in, fill out paperwork, Bring photo ID and insurance information Contact information: [address] [place] [phone]         MChevis Pretty FNP. Call.   Specialty: Family Medicine Why: Call for post-hospitalization follow up appointment with your primary care physician Contact information: [address] [phone]                 Signed: BW[facility] 07/14/2022, 1:32 PM Please see Amion for pager number during day hours 7:00am-4:30pm

## 2022-07-14 NOTE — Progress Notes (Signed)
[facility] Progress Note  5 Days Post-Op  Subjective: CC-  Up in chair. Feeling better and tolerating diet. Having bowel movements and feels like he needs to have another one.   Objective: Vital signs in last 24 hours: Temp:  [97.4 F (36.3 C)-98.2 F (36.8 C)] 97.8 F (36.6 C) (08/29 0450) Pulse Rate:  [61-70] 61 (08/29 0450) Resp:  [16-18] 18 (08/29 0450) BP: (141-153)/(60-68) 153/65 (08/29 0450) SpO2:  [95 %-100 %] 100 % (08/29 0450) Weight:  [92.9 kg] 92.9 kg (08/29 0500) Last BM Date : 07/14/22  Intake/Output from previous day: 08/28 0701 - 08/29 0700 In: 720 [P.O.:720] Out: 3 [Urine:1; Stool:2] Intake/Output this shift: Total I/O In: 60 [P.O.:60] Out: 0   PE: Gen:  Alert, NAD, pleasant Abd: soft, mild distension, +BS, mild RUQ TTP without peritonitis, incisions cdi, JP drain drain with scant brown tinged fluid  Lab Results:  Recent Labs    07/13/22 0405 07/14/22 0525  WBC 11.2* 10.7*  HGB 11.7* 11.3*  HCT 34.4* 33.8*  PLT 214 216    BMET Recent Labs    07/13/22 0405 07/14/22 0525  NA 142 143  K 3.9 3.9  CL 107 107  CO2 28 30  GLUCOSE 145* 143*  BUN 17 18  CREATININE 1.17 1.24  CALCIUM 8.7* 8.7*    PT/INR No results for input(s): "LABPROT", "INR" in the last 72 hours. CMP     Component Value Date/Time   NA 143 07/14/2022 0525   NA 141 11/21/2021 1225   K 3.9 07/14/2022 0525   CL 107 07/14/2022 0525   CO2 30 07/14/2022 0525   GLUCOSE 143 (H) 07/14/2022 0525   BUN 18 07/14/2022 0525   BUN 22 11/21/2021 1225   CREATININE 1.24 07/14/2022 0525   CREATININE 0.86 04/12/2013 1145   CALCIUM 8.7 (L) 07/14/2022 0525   PROT 5.6 (L) 07/14/2022 0525   PROT 6.2 11/21/2021 1225   ALBUMIN 3.1 (L) 07/14/2022 0525   ALBUMIN 4.4 11/21/2021 1225   AST 26 07/14/2022 0525   ALT 48 (H) 07/14/2022 0525   ALKPHOS 48 07/14/2022 0525   BILITOT 0.6 07/14/2022 0525   BILITOT 0.6 11/21/2021 1225   GFRNONAA 58 (L) 07/14/2022 0525   GFRNONAA 87  04/12/2013 1145   GFRAA 57 (L) 10/08/2020 1346   GFRAA >89 04/12/2013 1145   Lipase     Component Value Date/Time   LIPASE 32 07/10/2022 0445       Studies/Results: CT ABDOMEN PELVIS W CONTRAST  Result Date: 07/12/2022 CLINICAL DATA:  One week postop from cholecystectomy. Worsening abdominal pain. EXAM: CT ABDOMEN AND PELVIS WITH CONTRAST TECHNIQUE: Multidetector CT imaging of the abdomen and pelvis was performed using the standard protocol following bolus administration of intravenous contrast. RADIATION DOSE REDUCTION: This exam was performed according to the departmental dose-optimization program which includes automated exposure control, adjustment of the mA and/or kV according to patient size and/or use of iterative reconstruction technique. CONTRAST:  161m OMNIPAQUE IOHEXOL 300 MG/ML  SOLN COMPARISON:  05/05/2022 FINDINGS: Lower Chest: New small right pleural effusion and dependent atelectasis are seen. Hepatobiliary: Several sub-centimeter low-attenuation lesions in the left hepatic lobe show no significant change, likely representing tiny cysts. Patient has undergone cholecystectomy since prior exam. Surgical drain is seen in the gallbladder fossa and internal biliary stent is also seen in the distal common bile duct. No evidence of biliary ductal dilatation. A small amount of fluid and gas is seen within the cholecystectomy bed. Pancreas:  No mass  or inflammatory changes. Spleen: Within normal limits in size and appearance. Adrenals/Urinary Tract: No suspicious masses identified. No evidence of ureteral calculi or hydronephrosis. Stomach/Bowel: Small hiatal hernia again seen. No evidence of obstruction, inflammatory process or abnormal fluid collections. Normal appendix visualized. Colonic diverticulosis is again noted, without evidence of diverticulitis. Vascular/Lymphatic: No pathologically enlarged lymph nodes. No acute vascular findings. Aortic atherosclerotic calcification incidentally  noted. Reproductive:  No mass or other significant abnormality. Other:  None. Musculoskeletal:  No suspicious bone lesions identified. IMPRESSION: Postop changes from cholecystectomy. No evidence of biliary ductal dilatation. Small amount of fluid and gas within the gallbladder fossa, which may represent a postop fluid collection or biloma from bile leak. Nuclear medicine hepatobiliary scan could be performed for further evaluation if clinically warranted. New small right pleural effusion and dependent atelectasis. Colonic diverticulosis, without radiographic evidence of diverticulitis. Small hiatal hernia. Aortic Atherosclerosis (ICD10-I70.0). Electronically Signed   By: Marlaine Hind M.D.   On: 07/12/2022 15:07    Anti-infectives: Anti-infectives (From admission, onward)    Start     Dose/Rate Route Frequency Ordered Stop   07/06/22 1245  ceFEPIme (MAXIPIME) 2 g in sodium chloride 0.9 % 100 mL IVPB  Status:  Discontinued        2 g 200 mL/hr over 30 Minutes Intravenous Every 12 hours 07/06/22 1158 07/10/22 1345   07/05/22 1215  ceFEPIme (MAXIPIME) 2 g in sodium chloride 0.9 % 100 mL IVPB        2 g 200 mL/hr over 30 Minutes Intravenous  Once 07/05/22 1201 07/05/22 1313   07/05/22 1215  metroNIDAZOLE (FLAGYL) IVPB 500 mg  Status:  Discontinued        500 mg 100 mL/hr over 60 Minutes Intravenous Every 12 hours 07/05/22 1203 07/10/22 1345        Assessment/Plan POD #9 s/p laparoscopic subtotal fenestrated cholecystectomy with drain placement Dr. Zenia Resides 07/05/2022 - HIDA positive for bile leak and underwent ERCP 8/24 by Dr. Fuller Plan - CT obtained post op for leukocytosis: small amount of fluid and gas in the gallbladder fossa - Continue drain at discharge - WBC trending down and he is afebrile.  - tolerating carb mod diet.  - Mobilize with PT/OT, recommending home health   ID: Maxipime/flagyl 8/20>>8/25 FEN: CM diet VTE: Lovenox   GERD -Protonix Anemia-stable CKD  HLD Hypertension -Home  medications GAD -Home medications  Discharge today    LOS: 7 days    Winferd Humphrey, [facility] [address] 07/14/2022, 9:54 AM Please see Amion for pager number during day hours 7:00am-4:30pm

## 2022-07-14 NOTE — Progress Notes (Signed)
Mobility Specialist Cancellation/Refusal Note:    07/14/22 1119  Mobility  Activity Refused mobility     Reason for Cancellation/Refusal: Pt declined mobility at this time. Pt just walked.Will check back as schedule permits.     [facility]

## 2022-07-14 NOTE — Progress Notes (Signed)
PROGRESS NOTE    Francisco [name]  FBP:[id number] DOB: 1941-10-17 DOA: 07/05/2022 PCP: [name], FNP   Brief Narrative:  The patient is an 81 year old Caucasian male with past medical history significant for but limited to hypertension, hyperlipidemia, GERD, chronic kidney disease stage IIIa, as well as other comorbidities who was admitted to the hospital under surgical service for acute on chronic cholecystitis and underwent cholecystectomy.  Postoperatively patient ended up having a bile leak and hospitalist service was consulted to help address his medical issues.  He has been consulted for his subsequent bile leak and he has a percutaneous biliary drain in place.  He continues to have significant discomfort and is on IV cefepime and Flagyl and the plan is for ERCP and patient underwent biliary stenting with a plastic stent to help seal the bile leak.   Today he continues to have some abdominal discomfort and increased nausea as well as some slight shortness of breath.  Gastroenterology is recommending scheduled Zofran and Compazine as needed and they are going to advance his diet to full liquid diet and advance as tolerated.  There are planning ERCP biliary stent removal within 3 months. SLP evaluated and he was a mild aspiration risk.   Patient remains little bit nauseous and continues to have some abdominal discomfort.  Per general surgery since he continues to be nauseous and pain is still there, they are going to repeat a CT scan.  Repeat CT scan showed a small amount of fluid and gas in the gallbladder fossa but his WBC is trending down and diet has been advanced.  General surgery feels that he may be able to be discharged as soon as tomorrow  Assessment and Plan:  Acute on chronic cholecystitis with complication of a bile leak Abnormal LFTs, improving  Abdominal Pain, improving slowly  -Status post cholecystectomy with drain placement: General surgery recommending continuing  drain today -Currently on IV antibiotics with cefepime and metronidazole which we will continue but neurosurgery stopping today -Patient underwent a nuclear medicine HIDA scan which was concerning for a leak and general surgery is not a surprise given his severe cholecystitis and difficult subtotal cholecystectomy and have asked GI to evaluate for ERCP and stenting and plan is for ERCP with likely biliary stent and decompress the bile duct to help the bile leak still.  Patient is status post plastic stenting of his biliary duct and is postoperative day 2 and he continues to have some abdominal discomfort and nausea -GI is following and recommending a full liquid diet and advancing as tolerated and have scheduled Zofran and added as needed Compazine -Lipase is being checked -AST went from 39 -> 29 -> 43 -> 27 -> 20 -> 26 -ALT went from 180 -> 128 -> 102 -> 77 -> 59 -> 48 -Plan is for ERCP today as he is NPO and Lovenox held for ERCP; after his ERCP they are recommending a solid diet -Pathology has been benign from his surgery -Postoperative care per general surgery and they are recommending continuing the drain for now -PT/OT recommending Home Health  -GI recommending repeating ERCP as an outpatient with Dr. Henrene Pastor in 3 months with cholangiogram and stent pull -WBC went from 9.1 -> 12.2 -> 11.2 -> 10.7 -GI has recommended bowel regimen and have signed off the case now -Given that patient continue to be nauseous and continues to have some abdominal pain he will undergo a repeat CT scan of the abdomen pelvis to ensure that there is  no undrained bile -CT Scan of the Abdomen showed "Postop changes from cholecystectomy. No evidence of biliary ductal dilatation. Small amount of fluid and gas within the gallbladder fossa, which may represent a postop fluid collection or biloma from bile leak. Nuclear medicine hepatobiliary scan could be performed for further evaluation if clinically warranted. New small right  pleural effusion and dependent atelectasis. Colonic diverticulosis, without radiographic evidence of diverticulitis. Small hiatal hernia.Aortic Atherosclerosis." -General surgery is advancing his diet and they may be able to discharge him home tomorrow -Continue to monitor carefully   Dyspnea with cough, improving -States this happened post ERCP and chest x-ray done and showed no acute cardiopulmonary disease -Continue with incentive spirometry and monitoring -SLP shows that he is a minimal aspiration risk -Recommend ambulatory home O2 screen prior to discharge and he did not Desaturate   Chronic Kidney Disease Stage IIIa -Baseline creatinine approximately 1.5 -Creatinine appears to be near baseline and now BUNs/creatinine has gone from 19/1.20 -> 17/1.40 -> 21/1.34 -> 18/1.19 -> 21/1.05 -> 17/1.17 -> 18/1.24 -Continue to monitor -Appears that he is chronically on meloxicam as an outpatient.  Would not resume this on discharge -Outpatient Lasix dose currently on hold.  Resume once he has adequate p.o. intake -Avoid further nephrotoxic medications, contrast dyes, hypotension and dehydration to ensure adequate renal perfusion if possible and renally dose medications -Repeat CMP in a.m.   Hyperlipidemia -Continued statin but may need to hold given his abnormal LFTs as AST is now 26 and ALT is 48 -Continue monitor and trend and repeat CMP in the a.m. and if necessary obtain a right upper quadrant ultrasound and acute hepatitis panel   Hypertension -Continued on Toprol -Blood pressure currently stable we will need to monitor blood pressures carefully -Last BP reading was elevated at 153/65 -We will add IV hydralazine 10 mg every 6 as needed for SBP greater than 244 or diastolic blood pressure greater 100   GERD/GI prophylaxis -Continue PPI   Constipation -GI and general surgery following and he is on fiber and MiraLAX   BPH -Continued on Tamsulosin    Hyperglycemia -CBGs ranging  from 107-154 -Check A1c and was not elevated and was 5.3   Hypophosphatemia -Patient's Phos level is now 3.7 -Continue monitor and trend and repeat Phos level in a.m.   Hypoalbuminemia -Patient's albumin level is 3.0 -> 3.2 -> 3.0 x2 -> 3.2 -> 3.1 x2 -Continue monitor and trend and repeat CMP in a.m.   Normocytic anemia -Patient's hemoglobin/hematocrit is now gone from 11.6/33.6 -> 12.1/35.7 -> 11.7/34.4 -> 11.3/33.8 with an MCV of 33.8 -Checked Anemia panel and showed an iron level of 62, U IBC 147, TIBC 209, saturation ratio 30%, ferritin level 257, folate of 8.7, vitamin B12 1111 -Continue monitor for signs and symptoms bleeding; no overt bleeding noted -Repeat CBC in a.m.   Thrombocytopenia - Improved as Patient's platelet count has gone from 107 -> 126 -> 160 -> 163 -> 165 -> 209 -> 214 -> 216 -Continue monitor and trend and evaluate for signs and symptoms of bleeding and repeat CBC in a.m.  DVT prophylaxis: enoxaparin (LOVENOX) injection 40 mg Start: 07/10/22 1500 SCD's Start: 07/05/22 1507    Code Status: Full Code Family Communication: No family present at bedside  Disposition Plan:  Level of care: Med-Surg Status is: Inpatient Remains inpatient appropriate because: Patient is going to be discharged later today by general surgery   Consultants:  General Surgery Gastroenterology [facility]  Procedures:  Procedure: Laparoscopic subtotal fenestrating  cholecystectomy with drain placement   ERCP Findings:      The scout film showed a RUQ drain and was otherwise normal. The scope       was advanced to a normal major papilla with a hooded fold in the       descending duodenum. Limited examination of the pharynx, larynx and       associated structures, and upper GI tract showed a mild distal       esophageal stricture and otherwise appeared normal. A straight       Roadrunner wire was passed into the biliary tree. The short-nosed       traction sphincterotome  was passed over the guidewire and the bile duct       was then deeply cannulated. Contrast was injected. I personally       interpreted the bile duct images. There was appropriate flow of contrast       through the ducts. Extravasation of contrast originating from the       gallbladder remnant was observed. A subtotal cholecystectomy had been       performed. The biliary tree otherwise appeared normal. The intrahepatic       ducts were not fully filled with contrast flowing to the leak. A 5 mm       biliary sphincterotomy was made with a traction (standard)       sphincterotome using ERBE electrocautery to facilitate stent placement.       There was no post-sphincterotomy bleeding. One 10 Fr by 5 cm plastic       stent with a single external flap and a single internal flap was placed       4 cm into the common bile duct. Bile flowed through the stent. The stent       was in good position. The PD was not cannulated or injected by intention. Impression:               - A bile leak was found.                           - Prior subtotal cholecystectomy.                           - A biliary sphincterotomy was performed.                           - One plastic stent was placed into the common bile                            duct.                           - Non obstructing distal esophageal stricture. Moderate Sedation:      Not Applicable - Patient had care per Anesthesia. Recommendation:           - Return patient to hospital ward for ongoing care.                           - Avoid aspirin and nonsteroidal anti-inflammatory                            medicines for  1 week.                           - Observe patient's clinical course following                            today's ERCP with therapeutic intervention.                           - ERCP with biliary stent removal in 3 months with                            Dr. Henrene Pastor.    Antimicrobials:  Anti-infectives (From admission, onward)     Start     Dose/Rate Route Frequency Ordered Stop   07/06/22 1245  ceFEPIme (MAXIPIME) 2 g in sodium chloride 0.9 % 100 mL IVPB  Status:  Discontinued        2 g 200 mL/hr over 30 Minutes Intravenous Every 12 hours 07/06/22 1158 07/10/22 1345   07/05/22 1215  ceFEPIme (MAXIPIME) 2 g in sodium chloride 0.9 % 100 mL IVPB        2 g 200 mL/hr over 30 Minutes Intravenous  Once 07/05/22 1201 07/05/22 1313   07/05/22 1215  metroNIDAZOLE (FLAGYL) IVPB 500 mg  Status:  Discontinued        500 mg 100 mL/hr over 60 Minutes Intravenous Every 12 hours 07/05/22 1203 07/10/22 1345        Subjective: Seen and examined at bedside and states his abdominal pain is doing a little bit better today but he continues to still have some pain.  Denies any nausea or vomiting.  Complain about some slight arm swelling.  Denies any other concerns or complaints at this time and had a bowel movement.  Objective: Vitals:   07/13/22 1358 07/13/22 2201 07/14/22 0450 07/14/22 0500  BP: (!) 145/60 (!) 141/68 (!) 153/65   Pulse: 70 64 61   Resp: '18 16 18   '$ Temp: (!) 97.4 F (36.3 C) 98.2 F (36.8 C) 97.8 F (36.6 C)   TempSrc: Oral Oral Oral   SpO2: 95% 97% 100%   Weight:    92.9 kg  Height:        Intake/Output Summary (Last 24 hours) at 07/14/2022 1305 Last data filed at 07/14/2022 1113 Gross per 24 hour  Intake 840 ml  Output 2 ml  Net 838 ml   Filed Weights   07/09/22 1418 07/10/22 0500 07/14/22 0500  Weight: 95.7 kg 94.2 kg 92.9 kg   Examination: Physical Exam:  Constitutional: WN/WD obese Caucasian male, in no acute distress Respiratory: Diminished to auscultation bilaterally with coarse breath sounds, no wheezing, rales, rhonchi or crackles. Normal respiratory effort and patient is not tachypenic. No accessory muscle use.  Unlabored breathing Cardiovascular: RRR, no murmurs / rubs / gallops. S1 and S2 auscultated.  Abdomen: Soft, a little-tender, distended secondary body habitus.  Bowel sounds  positive.  GU: Deferred. Musculoskeletal: No clubbing / cyanosis of digits/nails. No joint deformity upper and lower extremities.  Skin: No rashes, lesions, ulcers on limited skin evaluation but does have a lip lesion. No induration; Warm and dry.  Neurologic: CN 2-12 grossly intact with no focal deficits. Sensation intact in all 4 Extremities, DTR normal. Psychiatric: Normal judgment and insight. Alert and oriented x 3. Normal mood and  appropriate affect.   Data Reviewed: I have personally reviewed following labs and imaging studies  CBC: Recent Labs  Lab 07/10/22 0445 07/11/22 0521 07/12/22 0851 07/13/22 0405 07/14/22 0525  WBC 8.4 9.1 12.2* 11.2* 10.7*  NEUTROABS 6.8 6.8 10.1* 8.4* 8.4*  HGB 11.2* 11.6* 12.1* 11.7* 11.3*  HCT 32.6* 33.6* 35.7* 34.4* 33.8*  MCV 93.4 94.4 94.4 95.0 96.8  PLT 163 165 209 214 735   Basic Metabolic Panel: Recent Labs  Lab 07/10/22 0445 07/11/22 0521 07/12/22 0439 07/12/22 0851 07/13/22 0405 07/14/22 0525  NA 143 141  --  136 142 143  K 3.8 3.7  --  3.8 3.9 3.9  CL 110 106  --  101 107 107  CO2 26 29  --  '28 28 30  '$ GLUCOSE 125* 111*  --  163* 145* 143*  BUN 21 18  --  '21 17 18  '$ CREATININE 1.34* 1.19 1.11 1.05 1.17 1.24  CALCIUM 8.5* 8.4*  --  8.1* 8.7* 8.7*  MG 2.2 2.1  --  2.0 2.2 2.1  PHOS 3.9 3.2  --  3.1 3.4 3.7   GFR: Estimated Creatinine Clearance: 52.6 mL/min (by C-G formula based on SCr of 1.24 mg/dL). Liver Function Tests: Recent Labs  Lab 07/10/22 0445 07/11/22 0521 07/12/22 0851 07/13/22 0405 07/14/22 0525  AST 43* '27 20 25 26  '$ ALT 102* 77* 59* 51* 48*  ALKPHOS 49 47 52 49 48  BILITOT 0.9 0.7 0.6 0.3 0.6  PROT 5.6* 5.5* 5.9* 5.7* 5.6*  ALBUMIN 3.0* 3.0* 3.2* 3.1* 3.1*   Recent Labs  Lab 07/10/22 0445  LIPASE 32   No results for input(s): "AMMONIA" in the last 168 hours. Coagulation Profile: No results for input(s): "INR", "PROTIME" in the last 168 hours. Cardiac Enzymes: No results for input(s): "CKTOTAL",  "CKMB", "CKMBINDEX", "TROPONINI" in the last 168 hours. BNP (last 3 results) No results for input(s): "PROBNP" in the last 8760 hours. HbA1C: No results for input(s): "HGBA1C" in the last 72 hours. CBG: Recent Labs  Lab 07/13/22 1145 07/13/22 1659 07/13/22 2203 07/14/22 0737 07/14/22 1147  GLUCAP 134* 117* 122* 130* 107*   Lipid Profile: No results for input(s): "CHOL", "HDL", "LDLCALC", "TRIG", "CHOLHDL", "LDLDIRECT" in the last 72 hours. Thyroid Function Tests: No results for input(s): "TSH", "T4TOTAL", "FREET4", "T3FREE", "THYROIDAB" in the last 72 hours. Anemia Panel: No results for input(s): "VITAMINB12", "FOLATE", "FERRITIN", "TIBC", "IRON", "RETICCTPCT" in the last 72 hours. Sepsis Labs: No results for input(s): "PROCALCITON", "LATICACIDVEN" in the last 168 hours.  Recent Results (from the past 240 hour(s))  Resp Panel by RT-PCR (Flu A&B, Covid)     Status: None   Collection Time: 07/05/22 12:32 PM   Specimen: Nasal Swab  Result Value Ref Range Status   SARS Coronavirus 2 by RT PCR NEGATIVE NEGATIVE Final    Comment: (NOTE) SARS-CoV-2 target nucleic acids are NOT DETECTED.  The SARS-CoV-2 RNA is generally detectable in upper respiratory specimens during the acute phase of infection. The lowest concentration of SARS-CoV-2 viral copies this assay can detect is 138 copies/mL. A negative result does not preclude SARS-Cov-2 infection and should not be used as the sole basis for treatment or other patient management decisions. A negative result may occur with  improper specimen collection/handling, submission of specimen other than nasopharyngeal swab, presence of viral mutation(s) within the areas targeted by this assay, and inadequate number of viral copies(<138 copies/mL). A negative result must be combined with clinical observations, patient history, and epidemiological information.  The expected result is Negative.  Fact Sheet for Patients:   EntrepreneurPulse.com.au  Fact Sheet for Healthcare Providers:  IncredibleEmployment.be  This test is no t yet approved or cleared by the [place] FDA and  has been authorized for detection and/or diagnosis of SARS-CoV-2 by FDA under an Emergency Use Authorization (EUA). This EUA will remain  in effect (meaning this test can be used) for the duration of the COVID-19 declaration under Section 564(b)(1) of the Act, 21 U.S.C.section 360bbb-3(b)(1), unless the authorization is terminated  or revoked sooner.       Influenza A by PCR NEGATIVE NEGATIVE Final   Influenza B by PCR NEGATIVE NEGATIVE Final    Comment: (NOTE) The Xpert Xpress SARS-CoV-2/FLU/RSV plus assay is intended as an aid in the diagnosis of influenza from Nasopharyngeal swab specimens and should not be used as a sole basis for treatment. Nasal washings and aspirates are unacceptable for Xpert Xpress SARS-CoV-2/FLU/RSV testing.  Fact Sheet for Patients: EntrepreneurPulse.com.au  Fact Sheet for Healthcare Providers: IncredibleEmployment.be  This test is not yet approved or cleared by the [place] FDA and has been authorized for detection and/or diagnosis of SARS-CoV-2 by FDA under an Emergency Use Authorization (EUA). This EUA will remain in effect (meaning this test can be used) for the duration of the COVID-19 declaration under Section 564(b)(1) of the Act, 21 U.S.C. section 360bbb-3(b)(1), unless the authorization is terminated or revoked.  Performed at [facility], [place] [address], [place], [place] [address]   Blood Culture (routine x 2)     Status: None   Collection Time: 07/05/22 12:32 PM   Specimen: BLOOD  Result Value Ref Range Status   Specimen Description   Final    BLOOD LEFT ANTECUBITAL Performed at [facility] [address], [place], [place] [address]    Special Requests    Final    BOTTLES DRAWN AEROBIC AND ANAEROBIC Blood Culture adequate volume Performed at [facility] [address] [place], [place] [address]    Culture  Setup Time   Final    GRAM POSITIVE RODS AEROBIC BOTTLE ONLY CRITICAL RESULT CALLED TO, READ BACK BY AND VERIFIED WITH: E JACKSON,PHARMD'@0200'$  07/07/22 [place]    Culture   Final    TURICELLA OTITIDIS Standardized susceptibility testing for this organism is not available. Performed at [facility], [place] [address], [place], [place] [address]    Report Status 07/09/2022 FINAL  Final  Blood Culture (routine x 2)     Status: None   Collection Time: 07/06/22  3:29 AM   Specimen: BLOOD  Result Value Ref Range Status   Specimen Description   Final    BLOOD BLOOD RIGHT HAND Performed at [facility] [address], [place], [place] [address]    Special Requests   Final    BOTTLES DRAWN AEROBIC ONLY Blood Culture adequate volume Performed at [facility] [address], [place], [place] [address]    Culture   Final    NO GROWTH 5 DAYS Performed at [facility], [place] [address] [place], [place] [address]    Report Status 07/11/2022 FINAL  Final  Urine Culture     Status: None   Collection Time: 07/06/22  2:00 PM   Specimen: Urine, Clean Catch  Result Value Ref Range Status   Specimen Description   Final    URINE, CLEAN CATCH Performed at [facility], [place] [address]  [place], [place] [address]    Special Requests   Final    NONE Performed at [facility], [place] [address] [place], [place] [address]    Culture   Final    NO GROWTH Performed at [facility], [place] [address] [place], [place] [address]    Report Status 07/07/2022 FINAL  Final     Radiology Studies: CT ABDOMEN PELVIS W CONTRAST  Result Date: 07/12/2022 CLINICAL DATA:  One week postop from cholecystectomy. Worsening abdominal pain. EXAM: CT ABDOMEN AND  PELVIS WITH CONTRAST TECHNIQUE: Multidetector CT imaging of the abdomen and pelvis was performed using the standard protocol following bolus administration of intravenous contrast. RADIATION DOSE REDUCTION: This exam was performed according to the departmental dose-optimization program which includes automated exposure control, adjustment of the mA and/or kV according to patient size and/or use of iterative reconstruction technique. CONTRAST:  156m OMNIPAQUE IOHEXOL 300 MG/ML  SOLN COMPARISON:  05/05/2022 FINDINGS: Lower Chest: New small right pleural effusion and dependent atelectasis are seen. Hepatobiliary: Several sub-centimeter low-attenuation lesions in the left hepatic lobe show no significant change, likely representing tiny cysts. Patient has undergone cholecystectomy since prior exam. Surgical drain is seen in the gallbladder fossa and internal biliary stent is also seen in the distal common bile duct. No evidence of biliary ductal dilatation. A small amount of fluid and gas is seen within the cholecystectomy bed. Pancreas:  No mass or inflammatory changes. Spleen: Within normal limits in size and appearance. Adrenals/Urinary Tract: No suspicious masses identified. No evidence of ureteral calculi or hydronephrosis. Stomach/Bowel: Small hiatal hernia again seen. No evidence of obstruction, inflammatory process or abnormal fluid collections. Normal appendix visualized. Colonic diverticulosis is again noted, without evidence of diverticulitis. Vascular/Lymphatic: No pathologically enlarged lymph nodes. No acute vascular findings. Aortic atherosclerotic calcification incidentally noted. Reproductive:  No mass or other significant abnormality. Other:  None. Musculoskeletal:  No suspicious bone lesions identified. IMPRESSION: Postop changes from cholecystectomy. No evidence of biliary ductal dilatation. Small amount of fluid and gas within the gallbladder fossa, which may represent a postop fluid collection or  biloma from bile leak. Nuclear medicine hepatobiliary scan could be performed for further evaluation if clinically warranted. New small right pleural effusion and dependent atelectasis. Colonic diverticulosis, without radiographic evidence of diverticulitis. Small hiatal hernia. Aortic Atherosclerosis (ICD10-I70.0). Electronically Signed   By: JMarlaine HindM.D.   On: 07/12/2022 15:07    Scheduled Meds:  acetaminophen  1,000 mg Oral TID   aspirin EC  81 mg Oral Daily   atorvastatin  40 mg Oral Daily   docusate sodium  100 mg Oral BID   enoxaparin (LOVENOX) injection  40 mg Subcutaneous Q24H   escitalopram  20 mg Oral Daily   insulin aspart  0-15 Units Subcutaneous TID WC   insulin aspart  0-5 Units Subcutaneous QHS   lip balm   Topical BID   metoprolol succinate  25 mg Oral BID   ondansetron  4 mg Oral Q12H   Or   ondansetron (ZOFRAN) IV  4 mg Intravenous Q12H   pantoprazole  20 mg Oral Daily   polycarbophil  625 mg Oral BID   polyethylene glycol  17 g Oral BID   tamsulosin  0.4 mg Oral Daily   Continuous Infusions:   LOS: 7 days   ORaiford Noble DO [facility] Available via Epic secure chat 7am-7pm After these hours, please refer to coverage provider listed on [email or web address] 07/14/2022, 1:05 PM

## 2022-07-17 ENCOUNTER — Encounter: Payer: Self-pay | Admitting: Family

## 2022-07-17 ENCOUNTER — Ambulatory Visit (INDEPENDENT_AMBULATORY_CARE_PROVIDER_SITE_OTHER): Payer: Medicare Other | Admitting: Family

## 2022-07-17 VITALS — BP 144/57 | HR 55 | Temp 98.0°F | Ht 69.0 in

## 2022-07-17 DIAGNOSIS — I10 Essential (primary) hypertension: Secondary | ICD-10-CM | POA: Diagnosis not present

## 2022-07-17 DIAGNOSIS — Z4803 Encounter for change or removal of drains: Secondary | ICD-10-CM | POA: Diagnosis not present

## 2022-07-17 DIAGNOSIS — N1831 Chronic kidney disease, stage 3a: Secondary | ICD-10-CM | POA: Diagnosis not present

## 2022-07-17 DIAGNOSIS — K812 Acute cholecystitis with chronic cholecystitis: Secondary | ICD-10-CM | POA: Diagnosis not present

## 2022-07-17 NOTE — Patient Instructions (Signed)
Cholecystostomy, Care After The following information offers guidance on how to care for yourself after your procedure. Your health care provider may also give you more specific instructions. If you have problems or questions, contact your health care provider. What can I expect after the procedure? After your procedure, it is common to have soreness near the incision site of your drainage tube (catheter). You may also have a small amount of fluid or blood coming from the catheter incision site. Follow these instructions at home: Incision care  Follow instructions from your health care provider about how to take care of your incision site where the catheter was inserted. Make sure you: Wash your hands with soap and water for at least 20 seconds before and after you change your bandage (dressing). If soap and water are not available, use hand sanitizer. Change your dressing as told by your health care provider. Check the catheter incision site every day for signs of infection. Check for: Redness, swelling, or pain. More fluid or blood. Warmth. Pus or a bad smell. Do not take baths, swim, or use a hot tub until your health care provider approves. Ask your health care provider if you may take showers. You may only be allowed to take sponge baths. General instructions Follow instructions from your health care provider about how to care for your catheter and collection bag at home. Your health care provider will show you: How to record the amount of drainage from the catheter. How to flush the catheter. How to care for the catheter incision site. Follow instructions from your health care provider about eating or drinking restrictions. Take over-the-counter and prescription medicines only as told by your health care provider. Keep all follow-up visits. This is important. Contact a health care provider if: You have a fever. Your soreness at the incision site gets worse or does not get better with  medicine. You have redness, swelling, or pain around the catheter incision site. You have more fluid or blood coming from the catheter incision site. You have nausea or vomiting. Get help right away if: You feel dizzy or you faint while standing. The area around the catheter incision site feels warm to the touch. You have pus or a bad smell coming from the catheter incision site. You have shortness of breath. You have a rapid heartbeat. Your catheter becomes blocked. Your catheter comes out of your abdomen. These symptoms may be an emergency. Get help right away. Call 911. Do not wait to see if the symptoms will go away. Do not drive yourself to the hospital. Do not drive yourself to the hospital. Summary After your procedure, it is common to have soreness near the catheter incision site. Change your dressing as told by your health care provider. Check the catheter incision site every day for signs of infection. Check for redness, swelling, pain, fluid, blood, pus, warmth, or a bad smell. Follow instructions from your health care provider about eating or drinking restrictions. This information is not intended to replace advice given to you by your health care provider. Make sure you discuss any questions you have with your health care provider. Document Revised: 05/06/2021 Document Reviewed: 05/06/2021 Elsevier Patient Education  [place].

## 2022-07-17 NOTE — Progress Notes (Signed)
Subjective:    Patient ID: Francisco [name], male    DOB: [birth year], 81 y.o.   MRN: [id number]  Chief Complaint  Patient presents with   Hospitalization Follow-up    HPI PT presents to the office today for hospital follow up. He went to the ED on 07/05/22 for abdominal pain found to have acute cholecystitis. He has this removed 07/12/22. He had a JP drained placed. He was discharged on 07/14/22. Was given IV antibiotics in hospital. Not discharged on any antibiotics. Since being home the JP drain had 2 tablespoons the first night. Yesterday, no discharge in JP tube in the last 48 hours.   He has home health pending. He has a follow up with General Surgery on 07/21/22.   Stopped his Mobic. Gave him oxycodone, but has not been taking it.   He reports his pain is a mild aching pain of 5 out 10. Denies any nausea, vomiting, diarrhea, or fevers.   He has CKD, HTN, and hyperlipidemia.   Review of Systems     Objective:   Physical Exam Vitals reviewed.  Constitutional:      General: He is not in acute distress.    Appearance: He is well-developed. He is obese.  HENT:     Head: Normocephalic.  Eyes:     General:        Right eye: No discharge.        Left eye: No discharge.     Pupils: Pupils are equal, round, and reactive to light.  Neck:     Thyroid: No thyromegaly.  Cardiovascular:     Rate and Rhythm: Normal rate and regular rhythm.     Heart sounds: Normal heart sounds. No murmur heard. Pulmonary:     Effort: Pulmonary effort is normal. No respiratory distress.     Breath sounds: Normal breath sounds. No wheezing.  Abdominal:     General: Bowel sounds are normal. There is no distension.     Palpations: Abdomen is soft.     Tenderness: There is no abdominal tenderness.  Musculoskeletal:        General: No tenderness. Normal range of motion.     Cervical back: Normal range of motion and neck supple.  Skin:    General: Skin is warm and dry.     Findings: No erythema or  rash.     Comments: 3 trochar sites, no erythemas or discharge. Jp drain RUQ, no erythemas, slightly golden discharge  Neurological:     Mental Status: He is alert and oriented to person, place, and time.     Cranial Nerves: No cranial nerve deficit.     Deep Tendon Reflexes: Reflexes are normal and symmetric.  Psychiatric:        Behavior: Behavior normal.        Thought Content: Thought content normal.        Judgment: Judgment normal.      Area cleaned, suture removed and JP drain removed. Intact ends. Pressure dressing applied. Pt tolerated well.     BP (!) 144/57   Pulse (!) 55   Temp 98 F (36.7 C) (Temporal)   Ht 5' 9"  (1.753 m)   BMI 30.24 kg/m   Assessment & Plan:  Francisco [name] comes in today with chief complaint of Hospitalization Follow-up   Diagnosis and orders addressed:  1. Acute cholecystitis with chronic cholecystitis Resolved - CMP14+EGFR - CBC with Differential/Platelet  2. Chronic kidney disease, stage 3a ([place]) - CMP14+EGFR -  CBC with Differential/Platelet  3. Essential hypertension - CMP14+EGFR - CBC with Differential/Platelet  4. Change or removal of drains - CMP14+EGFR - CBC with Differential/Platelet  5. Encounter for change or removal of drains - CMP14+EGFR - CBC with Differential/Platelet -Discussed importance of monitoring site for s/s of infection  Labs pending Health Maintenance reviewed Diet and exercise encouraged  Follow up plan: Keep follow up with PCP and surgeon.   Evelina Dun, FNP

## 2022-07-18 LAB — CBC WITH DIFFERENTIAL/PLATELET
Basophils Absolute: 0.1 10*3/uL (ref 0.0–0.2)
Basos: 1 %
EOS (ABSOLUTE): 0.4 10*3/uL (ref 0.0–0.4)
Eos: 4 %
Hematocrit: 33.6 % — ABNORMAL LOW (ref 37.5–51.0)
Hemoglobin: 11.1 g/dL — ABNORMAL LOW (ref 13.0–17.7)
Immature Grans (Abs): 0 10*3/uL (ref 0.0–0.1)
Immature Granulocytes: 0 %
Lymphocytes Absolute: 1 10*3/uL (ref 0.7–3.1)
Lymphs: 10 %
MCH: 31.5 pg (ref 26.6–33.0)
MCHC: 33 g/dL (ref 31.5–35.7)
MCV: 96 fL (ref 79–97)
Monocytes Absolute: 0.5 10*3/uL (ref 0.1–0.9)
Monocytes: 5 %
Neutrophils Absolute: 8 10*3/uL — ABNORMAL HIGH (ref 1.4–7.0)
Neutrophils: 80 %
Platelets: 288 10*3/uL (ref 150–450)
RBC: 3.52 x10E6/uL — ABNORMAL LOW (ref 4.14–5.80)
RDW: 13.5 % (ref 11.6–15.4)
WBC: 9.9 10*3/uL (ref 3.4–10.8)

## 2022-07-18 LAB — CMP14+EGFR
ALT: 48 IU/L — ABNORMAL HIGH (ref 0–44)
AST: 29 IU/L (ref 0–40)
Albumin/Globulin Ratio: 1.8 (ref 1.2–2.2)
Albumin: 3.6 g/dL — ABNORMAL LOW (ref 3.7–4.7)
Alkaline Phosphatase: 61 IU/L (ref 44–121)
BUN/Creatinine Ratio: 12 (ref 10–24)
BUN: 13 mg/dL (ref 8–27)
Bilirubin Total: 0.4 mg/dL (ref 0.0–1.2)
CO2: 24 mmol/L (ref 20–29)
Calcium: 8.4 mg/dL — ABNORMAL LOW (ref 8.6–10.2)
Chloride: 105 mmol/L (ref 96–106)
Creatinine, Ser: 1.1 mg/dL (ref 0.76–1.27)
Globulin, Total: 2 g/dL (ref 1.5–4.5)
Glucose: 143 mg/dL — ABNORMAL HIGH (ref 70–99)
Potassium: 4.5 mmol/L (ref 3.5–5.2)
Sodium: 142 mmol/L (ref 134–144)
Total Protein: 5.6 g/dL — ABNORMAL LOW (ref 6.0–8.5)
eGFR: 67 mL/min/{1.73_m2} (ref >59)

## 2022-07-19 ENCOUNTER — Other Ambulatory Visit: Payer: Self-pay | Admitting: Nurse Practitioner

## 2022-07-20 DIAGNOSIS — I129 Hypertensive chronic kidney disease with stage 1 through stage 4 chronic kidney disease, or unspecified chronic kidney disease: Secondary | ICD-10-CM | POA: Diagnosis not present

## 2022-07-20 DIAGNOSIS — K219 Gastro-esophageal reflux disease without esophagitis: Secondary | ICD-10-CM | POA: Diagnosis not present

## 2022-07-20 DIAGNOSIS — N189 Chronic kidney disease, unspecified: Secondary | ICD-10-CM | POA: Diagnosis not present

## 2022-07-20 DIAGNOSIS — Z48815 Encounter for surgical aftercare following surgery on the digestive system: Secondary | ICD-10-CM | POA: Diagnosis not present

## 2022-07-21 NOTE — Progress Notes (Signed)
Patient returning call. Please call back

## 2022-07-22 ENCOUNTER — Telehealth: Payer: Self-pay | Admitting: Family Medicine

## 2022-07-23 DIAGNOSIS — N189 Chronic kidney disease, unspecified: Secondary | ICD-10-CM | POA: Diagnosis not present

## 2022-07-23 DIAGNOSIS — K219 Gastro-esophageal reflux disease without esophagitis: Secondary | ICD-10-CM | POA: Diagnosis not present

## 2022-07-23 DIAGNOSIS — I129 Hypertensive chronic kidney disease with stage 1 through stage 4 chronic kidney disease, or unspecified chronic kidney disease: Secondary | ICD-10-CM | POA: Diagnosis not present

## 2022-07-23 DIAGNOSIS — Z48815 Encounter for surgical aftercare following surgery on the digestive system: Secondary | ICD-10-CM | POA: Diagnosis not present

## 2022-07-23 NOTE — Telephone Encounter (Signed)
Pt presents to his appointment very adamant on removing drained and told me he was told his PCP would remove at appointment as long as it had no drainage for 48 hours.

## 2022-07-27 DIAGNOSIS — I129 Hypertensive chronic kidney disease with stage 1 through stage 4 chronic kidney disease, or unspecified chronic kidney disease: Secondary | ICD-10-CM | POA: Diagnosis not present

## 2022-07-27 DIAGNOSIS — Z48815 Encounter for surgical aftercare following surgery on the digestive system: Secondary | ICD-10-CM | POA: Diagnosis not present

## 2022-07-27 DIAGNOSIS — N189 Chronic kidney disease, unspecified: Secondary | ICD-10-CM | POA: Diagnosis not present

## 2022-07-27 DIAGNOSIS — K219 Gastro-esophageal reflux disease without esophagitis: Secondary | ICD-10-CM | POA: Diagnosis not present

## 2022-07-30 ENCOUNTER — Ambulatory Visit (INDEPENDENT_AMBULATORY_CARE_PROVIDER_SITE_OTHER): Payer: Medicare Other

## 2022-07-30 DIAGNOSIS — Z9049 Acquired absence of other specified parts of digestive tract: Secondary | ICD-10-CM | POA: Diagnosis not present

## 2022-07-30 DIAGNOSIS — Z87891 Personal history of nicotine dependence: Secondary | ICD-10-CM | POA: Diagnosis not present

## 2022-07-30 DIAGNOSIS — K219 Gastro-esophageal reflux disease without esophagitis: Secondary | ICD-10-CM

## 2022-07-30 DIAGNOSIS — N189 Chronic kidney disease, unspecified: Secondary | ICD-10-CM | POA: Diagnosis not present

## 2022-07-30 DIAGNOSIS — I129 Hypertensive chronic kidney disease with stage 1 through stage 4 chronic kidney disease, or unspecified chronic kidney disease: Secondary | ICD-10-CM

## 2022-07-30 DIAGNOSIS — Z48815 Encounter for surgical aftercare following surgery on the digestive system: Secondary | ICD-10-CM | POA: Diagnosis not present

## 2022-07-30 DIAGNOSIS — F419 Anxiety disorder, unspecified: Secondary | ICD-10-CM

## 2022-07-31 DIAGNOSIS — K219 Gastro-esophageal reflux disease without esophagitis: Secondary | ICD-10-CM | POA: Diagnosis not present

## 2022-07-31 DIAGNOSIS — I129 Hypertensive chronic kidney disease with stage 1 through stage 4 chronic kidney disease, or unspecified chronic kidney disease: Secondary | ICD-10-CM | POA: Diagnosis not present

## 2022-07-31 DIAGNOSIS — Z48815 Encounter for surgical aftercare following surgery on the digestive system: Secondary | ICD-10-CM | POA: Diagnosis not present

## 2022-07-31 DIAGNOSIS — N189 Chronic kidney disease, unspecified: Secondary | ICD-10-CM | POA: Diagnosis not present

## 2022-08-03 DIAGNOSIS — K219 Gastro-esophageal reflux disease without esophagitis: Secondary | ICD-10-CM | POA: Diagnosis not present

## 2022-08-03 DIAGNOSIS — N189 Chronic kidney disease, unspecified: Secondary | ICD-10-CM | POA: Diagnosis not present

## 2022-08-03 DIAGNOSIS — I129 Hypertensive chronic kidney disease with stage 1 through stage 4 chronic kidney disease, or unspecified chronic kidney disease: Secondary | ICD-10-CM | POA: Diagnosis not present

## 2022-08-03 DIAGNOSIS — Z48815 Encounter for surgical aftercare following surgery on the digestive system: Secondary | ICD-10-CM | POA: Diagnosis not present

## 2022-08-06 DIAGNOSIS — I129 Hypertensive chronic kidney disease with stage 1 through stage 4 chronic kidney disease, or unspecified chronic kidney disease: Secondary | ICD-10-CM | POA: Diagnosis not present

## 2022-08-06 DIAGNOSIS — K219 Gastro-esophageal reflux disease without esophagitis: Secondary | ICD-10-CM | POA: Diagnosis not present

## 2022-08-06 DIAGNOSIS — Z48815 Encounter for surgical aftercare following surgery on the digestive system: Secondary | ICD-10-CM | POA: Diagnosis not present

## 2022-08-06 DIAGNOSIS — N189 Chronic kidney disease, unspecified: Secondary | ICD-10-CM | POA: Diagnosis not present

## 2022-08-07 ENCOUNTER — Other Ambulatory Visit: Payer: Self-pay | Admitting: Cardiology

## 2022-08-10 DIAGNOSIS — Z48815 Encounter for surgical aftercare following surgery on the digestive system: Secondary | ICD-10-CM | POA: Diagnosis not present

## 2022-08-10 DIAGNOSIS — I129 Hypertensive chronic kidney disease with stage 1 through stage 4 chronic kidney disease, or unspecified chronic kidney disease: Secondary | ICD-10-CM | POA: Diagnosis not present

## 2022-08-10 DIAGNOSIS — N189 Chronic kidney disease, unspecified: Secondary | ICD-10-CM | POA: Diagnosis not present

## 2022-08-10 DIAGNOSIS — K219 Gastro-esophageal reflux disease without esophagitis: Secondary | ICD-10-CM | POA: Diagnosis not present

## 2022-08-12 ENCOUNTER — Emergency Department (HOSPITAL_COMMUNITY)
Admission: EM | Admit: 2022-08-12 | Discharge: 2022-08-12 | Disposition: A | Discharge status: Home / Self Care | Payer: Medicare Other | Attending: Emergency Medicine | Admitting: Emergency Medicine

## 2022-08-12 ENCOUNTER — Telehealth: Payer: Self-pay | Admitting: Nurse Practitioner

## 2022-08-12 ENCOUNTER — Emergency Department (HOSPITAL_COMMUNITY): Payer: Medicare Other

## 2022-08-12 ENCOUNTER — Encounter (HOSPITAL_COMMUNITY): Payer: Self-pay | Admitting: Emergency Medicine

## 2022-08-12 ENCOUNTER — Other Ambulatory Visit: Payer: Self-pay

## 2022-08-12 DIAGNOSIS — R519 Headache, unspecified: Secondary | ICD-10-CM | POA: Diagnosis not present

## 2022-08-12 DIAGNOSIS — S12300A Unspecified displaced fracture of fourth cervical vertebra, initial encounter for closed fracture: Secondary | ICD-10-CM | POA: Insufficient documentation

## 2022-08-12 DIAGNOSIS — S134XXA Sprain of ligaments of cervical spine, initial encounter: Secondary | ICD-10-CM | POA: Diagnosis not present

## 2022-08-12 DIAGNOSIS — M542 Cervicalgia: Secondary | ICD-10-CM | POA: Diagnosis not present

## 2022-08-12 DIAGNOSIS — Y92002 Bathroom of unspecified non-institutional (private) residence single-family (private) house as the place of occurrence of the external cause: Secondary | ICD-10-CM | POA: Diagnosis not present

## 2022-08-12 DIAGNOSIS — W0110XA Fall on same level from slipping, tripping and stumbling with subsequent striking against unspecified object, initial encounter: Secondary | ICD-10-CM | POA: Diagnosis not present

## 2022-08-12 DIAGNOSIS — W182XXA Fall in (into) shower or empty bathtub, initial encounter: Secondary | ICD-10-CM | POA: Diagnosis not present

## 2022-08-12 DIAGNOSIS — G9589 Other specified diseases of spinal cord: Secondary | ICD-10-CM | POA: Diagnosis not present

## 2022-08-12 DIAGNOSIS — R930 Abnormal findings on diagnostic imaging of skull and head, not elsewhere classified: Secondary | ICD-10-CM | POA: Diagnosis not present

## 2022-08-12 DIAGNOSIS — S199XXA Unspecified injury of neck, initial encounter: Secondary | ICD-10-CM | POA: Diagnosis not present

## 2022-08-12 DIAGNOSIS — Z7982 Long term (current) use of aspirin: Secondary | ICD-10-CM | POA: Diagnosis not present

## 2022-08-12 DIAGNOSIS — S0990XA Unspecified injury of head, initial encounter: Secondary | ICD-10-CM | POA: Diagnosis not present

## 2022-08-12 DIAGNOSIS — S12301A Unspecified nondisplaced fracture of fourth cervical vertebra, initial encounter for closed fracture: Secondary | ICD-10-CM | POA: Diagnosis not present

## 2022-08-12 DIAGNOSIS — S299XXA Unspecified injury of thorax, initial encounter: Secondary | ICD-10-CM | POA: Diagnosis present

## 2022-08-12 DIAGNOSIS — M47812 Spondylosis without myelopathy or radiculopathy, cervical region: Secondary | ICD-10-CM | POA: Diagnosis not present

## 2022-08-12 HISTORY — DX: Dizziness and giddiness: R42

## 2022-08-12 NOTE — Telephone Encounter (Signed)
  Prescription Request  08/12/2022  Is this a "Controlled Substance" medicine? yes  Have you seen your PCP in the last 2 weeks? Appt 9/29  If YES, route message to pool  -  If NO, patient needs to be scheduled for appointment.  What is the name of the medication or equipment? ALPRAZolam Duanne Moron) 1 MG tablet  Have you contacted your pharmacy to request a refill? yes   Which pharmacy would you like this sent to? [facility] in [place]     Patient notified that their request is being sent to the clinical staff for review and that they should receive a response within 2 business days.

## 2022-08-12 NOTE — ED Notes (Signed)
Patient transported to CT 

## 2022-08-12 NOTE — ED Provider Notes (Addendum)
[facility] Provider Note   CSN: [id number] Arrival date & time: 08/12/22  0321     History  Chief Complaint  Patient presents with   Francisco [name] is a 81 y.o. male.  Patient presents to the emergency department for evaluation after a fall.  Patient fell in the bathroom.  Patient reports he lost his balance, causing him to fall backwards.  He hit the back of his head on the bathtub.  No loss of consciousness.  Patient complaining of posterior headache and neck pain, no other injury.       Home Medications Prior to Admission medications   Medication Sig Start Date End Date Taking? Authorizing Provider  acetaminophen (TYLENOL) 325 MG tablet Take 650 mg by mouth every 6 (six) hours as needed for mild pain or fever.    [provider]  ALPRAZolam (XANAX) 1 MG tablet TAKE 1 TABLET BY MOUTH TWICE  DAILY AS NEEDED FOR ANXIETY OR  SLEEP Patient taking differently: Take 1 mg by mouth 2 (two) times daily. 06/29/22   Hassell Done Mary-Margaret, FNP  aspirin 81 MG tablet Take 81 mg by mouth daily.    [provider]  docusate sodium (COLACE) 100 MG capsule Take 1 capsule (100 mg total) by mouth 2 (two) times daily. 07/14/22   Meuth, Brooke A, PA-C  escitalopram (LEXAPRO) 10 MG tablet TAKE 2 TABLETS BY MOUTH DAILY Patient taking differently: Take 10 mg by mouth daily. 06/05/22   Hassell Done Mary-Margaret, FNP  fish oil-omega-3 fatty acids 1000 MG capsule Take 2 g by mouth daily.    [provider]  furosemide (LASIX) 20 MG tablet TAKE 1 TABLET BY MOUTH DAILY 06/15/22   Hassell Done, Mary-Margaret, FNP  meclizine (ANTIVERT) 25 MG tablet TAKE 1 TABLET BY MOUTH 3  TIMES DAILY AS NEEDED FOR  DIZZINESS Patient taking differently: Take 25 mg by mouth 2 (two) times daily. 03/02/22   [name], FNP  metoprolol succinate (TOPROL-XL) 25 MG 24 hr tablet TAKE 1 TABLET BY MOUTH TWICE  DAILY 07/13/22   Park Liter, MD  oxyCODONE (OXY IR/ROXICODONE)  5 MG immediate release tablet Take 1 tablet (5 mg total) by mouth every 6 (six) hours as needed for moderate pain or severe pain. 07/14/22   Meuth, Brooke A, PA-C  pantoprazole (PROTONIX) 20 MG tablet TAKE 1 TABLET BY MOUTH DAILY 07/20/22   Hassell Done, Mary-Margaret, FNP  polyethylene glycol (MIRALAX / GLYCOLAX) 17 g packet Take 17 g by mouth 2 (two) times daily. 07/14/22   Meuth, Brooke A, PA-C  simvastatin (ZOCOR) 80 MG tablet Take 1 tablet (80 mg total) by mouth daily. 12/16/21   Park Liter, MD  tamsulosin (FLOMAX) 0.4 MG CAPS capsule TAKE 1 CAPSULE BY MOUTH DAILY Patient taking differently: Take 0.4 mg by mouth daily. 03/20/22   Hassell Done Mary-Margaret, FNP      Allergies    Crestor [rosuvastatin], Lipitor [atorvastatin], Penicillins, and Symbicort [budesonide-formoterol fumarate]    Review of Systems   Review of Systems  Physical Exam Updated Vital Signs BP 115/63   Pulse (!) 50   Temp 97.8 F (36.6 C) (Oral)   Resp 14   Ht '5\' 9"'$  (1.753 m)   Wt 81.6 kg   SpO2 97%   BMI 26.58 kg/m  Physical Exam Vitals and nursing note reviewed.  Constitutional:      General: He is not in acute distress.    Appearance: He is well-developed.  HENT:  Head: Normocephalic and atraumatic.     Mouth/Throat:     Mouth: Mucous membranes are moist.  Eyes:     General: Vision grossly intact. Gaze aligned appropriately.     Extraocular Movements: Extraocular movements intact.     Conjunctiva/sclera: Conjunctivae normal.  Cardiovascular:     Rate and Rhythm: Normal rate and regular rhythm.     Pulses: Normal pulses.     Heart sounds: Normal heart sounds, S1 normal and S2 normal. No murmur heard.    No friction rub. No gallop.  Pulmonary:     Effort: Pulmonary effort is normal. No respiratory distress.     Breath sounds: Normal breath sounds.  Abdominal:     Palpations: Abdomen is soft.     Tenderness: There is no abdominal tenderness. There is no guarding or rebound.     Hernia: No hernia is  present.  Musculoskeletal:        General: No swelling.     Cervical back: Full passive range of motion without pain, normal range of motion and neck supple. No pain with movement, spinous process tenderness or muscular tenderness. Normal range of motion.     Right lower leg: No edema.     Left lower leg: No edema.  Skin:    General: Skin is warm and dry.     Capillary Refill: Capillary refill takes less than 2 seconds.     Findings: No ecchymosis, erythema, lesion or wound.  Neurological:     Mental Status: He is alert and oriented to person, place, and time.     GCS: GCS eye subscore is 4. GCS verbal subscore is 5. GCS motor subscore is 6.     Cranial Nerves: Cranial nerves 2-12 are intact.     Sensory: Sensation is intact.     Motor: Motor function is intact. No weakness or abnormal muscle tone.     Coordination: Coordination is intact.  Psychiatric:        Mood and Affect: Mood normal.        Speech: Speech normal.        Behavior: Behavior normal.     ED Results / Procedures / Treatments   Labs (all labs ordered are listed, but only abnormal results are displayed) Labs Reviewed - No data to display  EKG None  Radiology CT CERVICAL SPINE WO CONTRAST  Addendum Date: 08/12/2022   ADDENDUM REPORT: 08/12/2022 04:13 ADDENDUM: These results were called by telephone at the time of interpretation on 08/12/2022 at 4:11 am to provider [facility] , who verbally acknowledged these results. Electronically Signed   By: Placido Sou M.D.   On: 08/12/2022 04:13   Result Date: 08/12/2022 CLINICAL DATA:  Neck trauma EXAM: CT CERVICAL SPINE WITHOUT CONTRAST TECHNIQUE: Multidetector CT imaging of the cervical spine was performed without intravenous contrast. Multiplanar CT image reconstructions were also generated. RADIATION DOSE REDUCTION: This exam was performed according to the departmental dose-optimization program which includes automated exposure control, adjustment of the mA  and/or kV according to patient size and/or use of iterative reconstruction technique. COMPARISON:  None available FINDINGS: Alignment: Slight anterolisthesis C7 on T1, presumed chronic. Skull base and vertebrae: Acute mildly displaced oblique fracture through the anterior inferior endplate of C4. Questionable widening of the C4-C5 interspace anteriorly. No retropulsion. No additional fractures. Soft tissues and spinal canal: Mild prevertebral soft tissue thickening about the C4 inferior endplate fracture. No visible canal hematoma. Disc levels: ACDF C5-C7. Multilevel advanced spondylosis and disc space height  loss. Multilevel advanced facet arthropathy. No high-grade spinal canal narrowing. Posterior disc osteophyte complex at C3-C4 causes mild effacement of the ventral thecal sac. Uncovertebral spurring and facet arthropathy cause neural foraminal narrowing most marked at C3-C4 bilaterally where it is advanced. Upper chest: Emphysema.  No acute abnormality. Other: None. IMPRESSION: Acute mildly displaced fracture of the anterior inferior corner of C4 with adjacent prevertebral edema. Questionable widening of the C4-C5 disc space anteriorly. MRI may be helpful for further evaluation for ligamentous injury. Electronically Signed: By: Placido Sou M.D. On: 08/12/2022 04:05   CT HEAD WO CONTRAST (5MM)  Result Date: 08/12/2022 CLINICAL DATA:  Fall, head injury EXAM: CT HEAD WITHOUT CONTRAST TECHNIQUE: Contiguous axial images were obtained from the base of the skull through the vertex without intravenous contrast. RADIATION DOSE REDUCTION: This exam was performed according to the departmental dose-optimization program which includes automated exposure control, adjustment of the mA and/or kV according to patient size and/or use of iterative reconstruction technique. COMPARISON:  None Available. FINDINGS: Brain: Normal anatomic configuration. Parenchymal volume loss is commensurate with the patient's age. Mild  periventricular white matter changes are present likely reflecting the sequela of small vessel ischemia. No abnormal intra or extra-axial mass lesion or fluid collection. No abnormal mass effect or midline shift. No evidence of acute intracranial hemorrhage or infarct. Ventricular size is normal. Cerebellum unremarkable. Vascular: No asymmetric hyperdense vasculature at the skull base. Skull: Intact Sinuses/Orbits: Moderate mucosal thickening within the visualized left maxillary sinus. Small mucous retention cyst or polyp within the left ethmoid air cells. No air-fluid levels. Remaining paranasal sinuses are clear. Orbits are unremarkable. Other: Mastoid air cells and middle ear cavities are clear. IMPRESSION: 1. No acute intracranial abnormality. No calvarial fracture. 2. Mild senescent change. 3. Moderate left maxillary sinus disease. Electronically Signed   By: Fidela Salisbury M.D.   On: 08/12/2022 04:02    Procedures Procedures    Medications Ordered in ED Medications - No data to display  ED Course/ Medical Decision Making/ A&P Clinical Course as of 08/12/22 0842  Wed Aug 12, 2022  0817 Pt w/ fall in bathroom, fell backwards and hit his head. Mainly complaining of neck pain w/ prior h/o surgery. No neurologic deficit or sxs. CT showed prevertebral swelling w/ C4 fracture.  [HN]  0818 1. Prior cervical spine surgery with solid fusion C5 through C7. 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 2. Subsequent C4-C5 disc space injury with fractured anterior inferior C4 endplate as demonstrated by CT. Evidence of disrupted C4-C5 anterior longitudinal ligament, disc, and bilateral facet joints (with increased STIR signal throughout), but maintained alignment. Multifactorial mild spinal stenosis and mild spinal cord mass effect there, but NO cord contusion or edema identified. This could be an unstable injury and Recommend  Neurosurgery consultation if not already done.  3. Prevertebral edema/hematoma associated with #2. No epidural hematoma identified. 4. 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 Severe bilateral C4 foraminal stenosis.   [HN]  2979 Pending neurosurgery consult. Patient is in a [place] J collar. [HN]    Clinical Course User Index [HN] Audley Hose, MD                           Medical Decision Making Amount and/or Complexity of Data Reviewed Radiology: ordered.   Patient presents with headache and neck pain after a fall.  Patient had a mechanical fall, caused by losing his balance.  He is awake and alert at arrival.  He has normal strength and sensation in all 4 extremities.  CT head does not show any acute injury.  CT cervical spine, however, shows anterior fracture of C4.  There is prevertebral swelling and possible widening of C4-C5, Querry ligamentous injury.  Discussed with patient and family.  It is currently 4:15 AM.  Options would be to transfer to [facility].  I suspect that it will be quicker, however, to wait until MRI is here at 730 and perform the MRI here.  Patient placed in [place] J collar.  The fracture that he has as well as if there is a ligamentous injury will likely be conservative treatment with collar.  Family has opted to stay here and have the MRI performed.  Will sign out to oncoming ER physician.  Consultation to neurosurgery based on imaging findings prior to disposition.   Addendum: Discussed with Dr. Christella Noa. Confirms that the patient needs to be strictly maintained in a cervical collar cannot remove it.  He will have his office contact the patient and try and get him in the office tomorrow for recheck.  Family reports that he does not do with pain medication would prefer to use Tylenol as needed for pain.     Final Clinical Impression(s) / ED Diagnoses Final diagnoses:  Closed displaced fracture of  fourth cervical vertebra, unspecified fracture morphology, initial encounter [facility])    Rx / [place] Orders ED Discharge Orders     None         Dabney Dever, Gwenyth Allegra, MD 08/12/22 0425    Orpah Greek, MD 08/12/22 0960    Orpah Greek, MD 08/12/22 [phone]

## 2022-08-12 NOTE — ED Triage Notes (Signed)
Pt to ed via [facility] from home. Pt fell while in the bathroom, back of head hit the bathtub. Denies any LOC/dizziness. C-Collar placed by EMS. C/o pain back of neck. Pt states they have been weak since gallbladder surgery back in August. Pt getting PT since surgery.   EMS 114/62 HR 55 99% RA CBG 108

## 2022-08-12 NOTE — ED Notes (Signed)
[employer] to pt

## 2022-08-12 NOTE — Telephone Encounter (Signed)
Spoke w/ pt, informed him that the requested medication was for a controlled med, that he had a signed contract w/ his PCP and that it could only be filled during a visit which he has this Friday, he verbalized understanding and was ok with this.

## 2022-08-12 NOTE — Discharge Instructions (Signed)
The collar cannot be removed at any time for any reason

## 2022-08-12 NOTE — ED Notes (Signed)
ED Provider at bedside. 

## 2022-08-13 ENCOUNTER — Other Ambulatory Visit: Payer: Self-pay | Admitting: Neurosurgery

## 2022-08-13 DIAGNOSIS — S12390A Other displaced fracture of fourth cervical vertebra, initial encounter for closed fracture: Secondary | ICD-10-CM | POA: Diagnosis not present

## 2022-08-14 ENCOUNTER — Encounter: Payer: Self-pay | Admitting: Nurse Practitioner

## 2022-08-14 ENCOUNTER — Ambulatory Visit (INDEPENDENT_AMBULATORY_CARE_PROVIDER_SITE_OTHER): Payer: Medicare Other | Admitting: Nurse Practitioner

## 2022-08-14 VITALS — BP 116/65 | HR 57 | Temp 97.7°F

## 2022-08-14 DIAGNOSIS — I739 Peripheral vascular disease, unspecified: Secondary | ICD-10-CM | POA: Diagnosis not present

## 2022-08-14 DIAGNOSIS — D649 Anemia, unspecified: Secondary | ICD-10-CM | POA: Diagnosis not present

## 2022-08-14 DIAGNOSIS — E782 Mixed hyperlipidemia: Secondary | ICD-10-CM

## 2022-08-14 DIAGNOSIS — D696 Thrombocytopenia, unspecified: Secondary | ICD-10-CM

## 2022-08-14 DIAGNOSIS — N400 Enlarged prostate without lower urinary tract symptoms: Secondary | ICD-10-CM

## 2022-08-14 DIAGNOSIS — F411 Generalized anxiety disorder: Secondary | ICD-10-CM

## 2022-08-14 DIAGNOSIS — I10 Essential (primary) hypertension: Secondary | ICD-10-CM | POA: Diagnosis not present

## 2022-08-14 DIAGNOSIS — Z6831 Body mass index (BMI) 31.0-31.9, adult: Secondary | ICD-10-CM

## 2022-08-14 DIAGNOSIS — N1831 Chronic kidney disease, stage 3a: Secondary | ICD-10-CM | POA: Diagnosis not present

## 2022-08-14 DIAGNOSIS — K219 Gastro-esophageal reflux disease without esophagitis: Secondary | ICD-10-CM

## 2022-08-14 DIAGNOSIS — Z23 Encounter for immunization: Secondary | ICD-10-CM | POA: Diagnosis not present

## 2022-08-14 MED ORDER — TAMSULOSIN HCL 0.4 MG PO CAPS: 0.4000 mg | ORAL_CAPSULE | Freq: Every day | ORAL | 2 refills | Status: DC

## 2022-08-14 MED ORDER — ESCITALOPRAM OXALATE 10 MG PO TABS: 20.0000 mg | ORAL_TABLET | Freq: Every day | ORAL | 1 refills | Status: DC

## 2022-08-14 MED ORDER — FUROSEMIDE 20 MG PO TABS: 20.0000 mg | ORAL_TABLET | Freq: Every day | ORAL | 1 refills | Status: DC

## 2022-08-14 MED ORDER — ALPRAZOLAM 1 MG PO TABS: 1.0000 mg | ORAL_TABLET | Freq: Two times a day (BID) | ORAL | 2 refills | Status: DC | PRN

## 2022-08-14 MED ORDER — PANTOPRAZOLE SODIUM 20 MG PO TBEC: 20.0000 mg | DELAYED_RELEASE_TABLET | Freq: Every day | ORAL | 1 refills | Status: DC

## 2022-08-14 MED ORDER — SIMVASTATIN 80 MG PO TABS: 80.0000 mg | ORAL_TABLET | Freq: Every day | ORAL | 2 refills | Status: DC

## 2022-08-14 NOTE — Patient Instructions (Signed)

## 2022-08-14 NOTE — Progress Notes (Signed)
Subjective:    Patient ID: Francisco [name], male    DOB: February 05, 1941, 81 y.o.   MRN: [id number]   Chief Complaint: medical management of chronic issues     HPI:  Francisco [name] is a 81 y.o. who identifies as a male who was assigned male at birth.   Social history: Lives with: by hisself- his daughter s check on him daily Work history: retired   Scientist, forensic in today for follow up of the following chronic medical issues:  1. Essential hypertension No c/o chest pain, sob or headache. Does not check blood pressure at home very often BP Readings from Last 3 Encounters:  08/12/22 119/66  07/17/22 (!) 144/57  07/14/22 (!) 153/65     2. Mixed hyperlipidemia Dos try to watch diet but does no exercise at all. Lab Results  Component Value Date   CHOL 157 11/21/2021   HDL 39 (L) 11/21/2021   LDLCALC 87 11/21/2021   TRIG 180 (H) 11/21/2021   CHOLHDL 4.0 11/21/2021     3. 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 They made no changes to plan of care and he is to follow up yearly.  4. GERD without esophagitis Is on protonix daily and seems to work well for him  5. Chronic kidney disease, stage 3a ([place]) No voiding issues Lab Results  Component Value Date   CREATININE 1.10 07/17/2022     6. Benign prostatic hyperplasia, unspecified whether lower urinary tract symptoms present Lab Results  Component Value Date   PSA1 2.2 04/09/2020   PSA1 1.3 02/09/2018   PSA 1.2 12/27/2014   PSA 1.0 07/14/2013      7. Thrombocytopenia ([place]) Lab Results  Component Value Date   PLT 288 07/17/2022     8. GAD (generalized anxiety disorder) Is on xanax and lexapro. Is a Research officer, trade [employer]. He says he is ok right now  9. Normocytic anemia Lab Results  Component Value Date   HGB 11.1 (L) 07/17/2022     10. BMI 31.0-31.9,adult Weight is down 24lbs Wt Readings from Last 3 Encounters:  08/12/22 180 lb (81.6  kg)  07/14/22 204 lb 12.9 oz (92.9 kg)  05/05/22 169 lb (76.7 kg)   BMI Readings from Last 3 Encounters:  08/12/22 26.58 kg/m  07/17/22 30.24 kg/m  07/14/22 30.24 kg/m      New complaints: He had his gallbladder removed on 07/05/22. He is doing well from that. He is scheduled to have cervical neck surgery next week.  Allergies  Allergen Reactions   Crestor [Rosuvastatin] Other (See Comments)    Unknown per Pt   Lipitor [Atorvastatin] Other (See Comments)    Unknown per Pt   Penicillins     REACTION: swelling/hives Has patient had a PCN reaction causing immediate rash, facial/tongue/throat swelling, SOB or lightheadedness with hypotension:yes Has patient had a PCN reaction causing severe rash involving mucus membranes or skin necrosis: Yes Has patient had a PCN reaction that required hospitalization No Has patient had a PCN reaction occurring within the last 10 years: No If all of the above answers are "NO", then may proceed with Cephalosporin use.    Symbicort [Budesonide-Formoterol Fumarate] Other (See Comments)    Pain Behind ribs Unknown per Pt   Outpatient Encounter Medications as of 08/14/2022  Medication Sig   acetaminophen (TYLENOL) 325 MG tablet Take 650 mg by mouth every 6 (six) hours as needed for mild pain or fever.  ALPRAZolam (XANAX) 1 MG tablet TAKE 1 TABLET BY MOUTH TWICE  DAILY AS NEEDED FOR ANXIETY OR  SLEEP (Patient taking differently: Take 1 mg by mouth 2 (two) times daily.)   aspirin 81 MG tablet Take 81 mg by mouth daily.   docusate sodium (COLACE) 100 MG capsule Take 1 capsule (100 mg total) by mouth 2 (two) times daily. (Patient taking differently: Take 100 mg by mouth daily as needed for mild constipation or moderate constipation.)   escitalopram (LEXAPRO) 10 MG tablet TAKE 2 TABLETS BY MOUTH DAILY (Patient taking differently: Take 10 mg by mouth 2 (two) times daily.)   fish oil-omega-3 fatty acids 1000 MG capsule Take 2 g by mouth daily.    furosemide (LASIX) 20 MG tablet TAKE 1 TABLET BY MOUTH DAILY   meclizine (ANTIVERT) 25 MG tablet TAKE 1 TABLET BY MOUTH 3  TIMES DAILY AS NEEDED FOR  DIZZINESS (Patient taking differently: Take 25 mg by mouth 3 (three) times daily as needed for dizziness.)   metoprolol succinate (TOPROL-XL) 25 MG 24 hr tablet TAKE 1 TABLET BY MOUTH TWICE  DAILY   oxyCODONE (OXY IR/ROXICODONE) 5 MG immediate release tablet Take 1 tablet (5 mg total) by mouth every 6 (six) hours as needed for moderate pain or severe pain.   pantoprazole (PROTONIX) 20 MG tablet TAKE 1 TABLET BY MOUTH DAILY   polyethylene glycol (MIRALAX / GLYCOLAX) 17 g packet Take 17 g by mouth 2 (two) times daily. (Patient taking differently: Take 17 g by mouth 2 (two) times daily as needed for mild constipation or moderate constipation.)   simvastatin (ZOCOR) 80 MG tablet Take 1 tablet (80 mg total) by mouth daily.   tamsulosin (FLOMAX) 0.4 MG CAPS capsule TAKE 1 CAPSULE BY MOUTH DAILY (Patient taking differently: Take 0.4 mg by mouth daily.)   No facility-administered encounter medications on file as of 08/14/2022.    Past Surgical History:  Procedure Laterality Date   AMPUTATION Left 08/05/2016   Procedure: REVISION AMPUTATION LEFT INDEX FINGER REPAIR LACERATION LEFT THUMB;  Surgeon: [name], MD;  Location: [facility];  Service: Orthopedics;  Laterality: Left;   BILIARY STENT PLACEMENT N/A 07/09/2022   Procedure: BILIARY STENT PLACEMENT;  Surgeon: Ladene Artist, MD;  Location: [facility];  Service: Gastroenterology;  Laterality: N/A;   CHOLECYSTECTOMY N/A 07/05/2022   Procedure: LAPAROSCOPIC CHOLECYSTECTOMY;  Surgeon: Dwan Bolt, MD;  Location: [facility];  Service: General;  Laterality: N/A;   ERCP N/A 07/09/2022   Procedure: ENDOSCOPIC RETROGRADE CHOLANGIOPANCREATOGRAPHY (ERCP);  Surgeon: Ladene Artist, MD;  Location: [facility];  Service: Gastroenterology;  Laterality: N/A;   SPHINCTEROTOMY  07/09/2022   Procedure: SPHINCTEROTOMY;   Surgeon: Ladene Artist, MD;  Location: [facility];  Service: Gastroenterology;;   SPINE SURGERY      Family History  Problem Relation Age of Onset   Healthy Mother    Transient ischemic attack Father    Rheum arthritis Daughter    Amblyopia Neg Hx    Blindness Neg Hx    Cataracts Neg Hx    Glaucoma Neg Hx    Macular degeneration Neg Hx    Retinal detachment Neg Hx    Strabismus Neg Hx    Retinitis pigmentosa Neg Hx       Review of Systems  Constitutional:  Negative for diaphoresis.  Eyes:  Negative for pain.  Respiratory:  Negative for shortness of breath.   Cardiovascular:  Negative for chest pain, palpitations and leg swelling.  Gastrointestinal:  Negative  for abdominal pain.  Endocrine: Negative for polydipsia.  Skin:  Negative for rash.  Neurological:  Negative for dizziness, weakness and headaches.  Hematological:  Does not bruise/bleed easily.  All other systems reviewed and are negative.      Objective:   Physical Exam Vitals and nursing note reviewed.  Constitutional:      Appearance: Normal appearance. He is well-developed.  HENT:     Head: Normocephalic.     Nose: Nose normal.     Mouth/Throat:     Mouth: Mucous membranes are moist.     Pharynx: Oropharynx is clear.  Eyes:     Pupils: Pupils are equal, round, and reactive to light.  Neck:     Thyroid: No thyroid mass or thyromegaly.     Vascular: No carotid bruit or JVD.     Trachea: Phonation normal.  Cardiovascular:     Rate and Rhythm: Normal rate and regular rhythm.  Pulmonary:     Effort: Pulmonary effort is normal. No respiratory distress.     Breath sounds: Normal breath sounds.  Abdominal:     General: Bowel sounds are normal.     Palpations: Abdomen is soft.     Tenderness: There is no abdominal tenderness.  Musculoskeletal:        General: Normal range of motion.     Cervical back: Normal range of motion and neck supple.     Comments: in wheel chair_0 cervical collar in place   Lymphadenopathy:     Cervical: No cervical adenopathy.  Skin:    General: Skin is warm and dry.     Comments: abrasion on left knee  Neurological:     Mental Status: He is alert and oriented to person, place, and time.  Psychiatric:        Behavior: Behavior normal.        Thought Content: Thought content normal.        Judgment: Judgment normal.     BP 116/65   Pulse (!) 57   Temp 97.7 F (36.5 C) (Temporal)   SpO2 98%        Assessment & Plan:   JARRIS KORTZ comes in today with chief complaint of Medical Management of Chronic Issues   Diagnosis and orders addressed:  1. Primary hypertension Low sodium ediet - furosemide (LASIX) 20 MG tablet; Take 1 tablet (20 mg total) by mouth daily.  Dispense: 100 tablet; Refill: 1 - CBC with Differential/Platelet - CMP14+EGFR  2. Mixed hyperlipidemia Low fat diet - simvastatin (ZOCOR) 80 MG tablet; Take 1 tablet (80 mg total) by mouth daily.  Dispense: 90 tablet; Refill: 2 - Lipid panel  3. 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 GERD without esophagitis Avoid spicy foods Do not eat 2 hours prior to bedtime  - pantoprazole (PROTONIX) 20 MG tablet; Take 1 tablet (20 mg total) by mouth daily.  Dispense: 90 tablet; Refill: 1  5. Chronic kidney disease, stage 3a ([place]) Labs pending  6. Benign prostatic hyperplasia, unspecified whether lower urinary tract symptoms present Report any voiding issues - tamsulosin (FLOMAX) 0.4 MG CAPS capsule; Take 1 capsule (0.4 mg total) by mouth daily.  Dispense: 100 capsule; Refill: 2  7. Thrombocytopenia ([place]) Labs pending  8. GAD (generalized anxiety disorder) Stress management - ALPRAZolam (XANAX) 1 MG tablet; Take 1 tablet (1 mg total) by mouth 2 (two) times daily as needed for anxiety. TAKE 1 TABLET BY MOUTH TWICE  DAILY AS NEEDED FOR ANXIETY  OR  SLEEP Strength: 1 mg  Dispense: 60 tablet; Refill: 2 - escitalopram (LEXAPRO) 10 MG tablet; Take 2  tablets (20 mg total) by mouth daily.  Dispense: 180 tablet; Refill: 1  9. Normocytic anemia Labs pending  10. BMI 31.0-31.9,adult Discussed diet and exercise for person with BMI >25 Will recheck weight in 3-6 months    Labs pending Health Maintenance reviewed Diet and exercise encouraged  Follow up plan: 6 months   [name], FNP

## 2022-08-15 LAB — CBC WITH DIFFERENTIAL/PLATELET
Basophils Absolute: 0 10*3/uL (ref 0.0–0.2)
Basos: 1 %
EOS (ABSOLUTE): 0.3 10*3/uL (ref 0.0–0.4)
Eos: 3 %
Hematocrit: 35.5 % — ABNORMAL LOW (ref 37.5–51.0)
Hemoglobin: 11.9 g/dL — ABNORMAL LOW (ref 13.0–17.7)
Immature Grans (Abs): 0 10*3/uL (ref 0.0–0.1)
Immature Granulocytes: 0 %
Lymphocytes Absolute: 1.1 10*3/uL (ref 0.7–3.1)
Lymphs: 14 %
MCH: 31.6 pg (ref 26.6–33.0)
MCHC: 33.5 g/dL (ref 31.5–35.7)
MCV: 94 fL (ref 79–97)
Monocytes Absolute: 0.6 10*3/uL (ref 0.1–0.9)
Monocytes: 7 %
Neutrophils Absolute: 6.3 10*3/uL (ref 1.4–7.0)
Neutrophils: 75 %
Platelets: 181 10*3/uL (ref 150–450)
RBC: 3.77 x10E6/uL — ABNORMAL LOW (ref 4.14–5.80)
RDW: 14.4 % (ref 11.6–15.4)
WBC: 8.4 10*3/uL (ref 3.4–10.8)

## 2022-08-15 LAB — CMP14+EGFR
ALT: 17 IU/L (ref 0–44)
AST: 21 IU/L (ref 0–40)
Albumin/Globulin Ratio: 1.9 (ref 1.2–2.2)
Albumin: 3.9 g/dL (ref 3.7–4.7)
Alkaline Phosphatase: 62 IU/L (ref 44–121)
BUN/Creatinine Ratio: 17 (ref 10–24)
BUN: 20 mg/dL (ref 8–27)
Bilirubin Total: 0.7 mg/dL (ref 0.0–1.2)
CO2: 25 mmol/L (ref 20–29)
Calcium: 8.9 mg/dL (ref 8.6–10.2)
Chloride: 100 mmol/L (ref 96–106)
Creatinine, Ser: 1.2 mg/dL (ref 0.76–1.27)
Globulin, Total: 2.1 g/dL (ref 1.5–4.5)
Glucose: 114 mg/dL — ABNORMAL HIGH (ref 70–99)
Potassium: 4.3 mmol/L (ref 3.5–5.2)
Sodium: 139 mmol/L (ref 134–144)
Total Protein: 6 g/dL (ref 6.0–8.5)
eGFR: 61 mL/min/{1.73_m2} (ref >59)

## 2022-08-15 LAB — LIPID PANEL
Chol/HDL Ratio: 3.2 ratio (ref 0.0–5.0)
Cholesterol, Total: 133 mg/dL (ref 100–199)
HDL: 41 mg/dL (ref >39)
LDL Chol Calc (NIH): 63 mg/dL (ref 0–99)
Triglycerides: 171 mg/dL — ABNORMAL HIGH (ref 0–149)
VLDL Cholesterol Cal: 29 mg/dL (ref 5–40)

## 2022-08-17 ENCOUNTER — Telehealth: Payer: Self-pay | Admitting: Nurse Practitioner

## 2022-08-17 DIAGNOSIS — F411 Generalized anxiety disorder: Secondary | ICD-10-CM

## 2022-08-17 MED ORDER — ALPRAZOLAM 1 MG PO TABS: 1.0000 mg | ORAL_TABLET | Freq: Two times a day (BID) | ORAL | 2 refills | Status: DC | PRN

## 2022-08-17 NOTE — Progress Notes (Signed)
No answer left detailed message on patient's answering machine  EKG - 07/07/22 ECHO - 12/17/21  Aspirin Instructions: Instructed to follow his surgeon's instruction for Aspirin ERAS Protcol - Yes  -------------  SDW INSTRUCTIONS:  Your procedure is scheduled on 08/18/22. Please report to [facility] Main Entrance "A" at 1030 A.M., and check in at the Admitting office. Call this number if you have problems the morning of surgery: [phone]   Remember:  You may drink clear liquids until 10am the morning of your surgery.   Clear liquids allowed are: Water, Non-Citrus Juices (without pulp), Carbonated Beverages, Clear Tea, Black Coffee Only, and Gatorade   Medications to take morning of surgery with a sip of water include: Lexapro, metoprolol, protonix, zocor, flomax If needed: Tylenol, Xanax Antivert  Follow your surgeon's instructions on your Aspirin.   As of today, STOP taking any Aspirin (unless otherwise instructed by your surgeon), Aleve, Naproxen, Ibuprofen, Motrin, Advil, Goody's, BC's, all herbal medications, fish oil, and all vitamins.    The Morning of Surgery Do not wear jewelry Do not wear lotions, powders, or colognes, or deodorant Do not bring valuables to the hospital. [facility] is not responsible for any belongings or valuables.  I Please shower the NIGHT BEFORE/MORNING OF SURGERY (use antibacterial soap like DIAL soap if possible). Wear comfortable clothes the morning of surgery. Oral Hygiene is also important to reduce your risk of infection.

## 2022-08-17 NOTE — Telephone Encounter (Signed)
[facility] did not receive Xanax Rx that was sent on Friday. Needs to be resent.

## 2022-08-18 ENCOUNTER — Inpatient Hospital Stay (HOSPITAL_COMMUNITY): Payer: Medicare Other

## 2022-08-18 ENCOUNTER — Inpatient Hospital Stay (HOSPITAL_COMMUNITY): Payer: Medicare Other | Admitting: Anesthesiology

## 2022-08-18 ENCOUNTER — Inpatient Hospital Stay (HOSPITAL_COMMUNITY)
Admission: RE | Admit: 2022-08-18 | Discharge: 2022-08-19 | DRG: 473 | Disposition: A | Discharge status: Home / Self Care | Payer: Medicare Other | Attending: Neurosurgery | Admitting: Neurosurgery

## 2022-08-18 ENCOUNTER — Other Ambulatory Visit: Payer: Self-pay

## 2022-08-18 ENCOUNTER — Encounter (HOSPITAL_COMMUNITY)
Admission: RE | Disposition: A | Discharge status: Home / Self Care | Payer: Self-pay | Source: Home / Self Care | Attending: Neurosurgery

## 2022-08-18 ENCOUNTER — Encounter (HOSPITAL_COMMUNITY): Payer: Self-pay | Admitting: Neurosurgery

## 2022-08-18 DIAGNOSIS — Z880 Allergy status to penicillin: Secondary | ICD-10-CM | POA: Diagnosis not present

## 2022-08-18 DIAGNOSIS — S12300A Unspecified displaced fracture of fourth cervical vertebra, initial encounter for closed fracture: Principal | ICD-10-CM | POA: Diagnosis present

## 2022-08-18 DIAGNOSIS — Z888 Allergy status to other drugs, medicaments and biological substances status: Secondary | ICD-10-CM | POA: Diagnosis not present

## 2022-08-18 DIAGNOSIS — Z7982 Long term (current) use of aspirin: Secondary | ICD-10-CM | POA: Diagnosis not present

## 2022-08-18 DIAGNOSIS — I10 Essential (primary) hypertension: Secondary | ICD-10-CM | POA: Diagnosis present

## 2022-08-18 DIAGNOSIS — Z87891 Personal history of nicotine dependence: Secondary | ICD-10-CM

## 2022-08-18 DIAGNOSIS — K219 Gastro-esophageal reflux disease without esophagitis: Secondary | ICD-10-CM | POA: Diagnosis present

## 2022-08-18 DIAGNOSIS — F419 Anxiety disorder, unspecified: Secondary | ICD-10-CM | POA: Diagnosis present

## 2022-08-18 DIAGNOSIS — W19XXXA Unspecified fall, initial encounter: Secondary | ICD-10-CM | POA: Diagnosis present

## 2022-08-18 DIAGNOSIS — M4322 Fusion of spine, cervical region: Secondary | ICD-10-CM | POA: Diagnosis not present

## 2022-08-18 DIAGNOSIS — S12390A Other displaced fracture of fourth cervical vertebra, initial encounter for closed fracture: Secondary | ICD-10-CM

## 2022-08-18 DIAGNOSIS — I739 Peripheral vascular disease, unspecified: Secondary | ICD-10-CM | POA: Diagnosis present

## 2022-08-18 DIAGNOSIS — Z8261 Family history of arthritis: Secondary | ICD-10-CM

## 2022-08-18 DIAGNOSIS — Z79899 Other long term (current) drug therapy: Secondary | ICD-10-CM

## 2022-08-18 DIAGNOSIS — Z823 Family history of stroke: Secondary | ICD-10-CM

## 2022-08-18 HISTORY — PX: POSTERIOR CERVICAL FUSION/FORAMINOTOMY: SHX5038

## 2022-08-18 LAB — ABO/RH: ABO/RH(D): A POS

## 2022-08-18 LAB — SURGICAL PCR SCREEN
MRSA, PCR: NEGATIVE
Staphylococcus aureus: NEGATIVE

## 2022-08-18 LAB — TYPE AND SCREEN
ABO/RH(D): A POS
Antibody Screen: NEGATIVE

## 2022-08-18 SURGERY — POSTERIOR CERVICAL FUSION/FORAMINOTOMY LEVEL 1
Anesthesia: General | Site: Spine Cervical

## 2022-08-18 MED ORDER — TAMSULOSIN HCL 0.4 MG PO CAPS
0.4000 mg | ORAL_CAPSULE | Freq: Every day | ORAL | Status: DC
  Administered 2022-08-19: 0.4 mg via ORAL
  Filled 2022-08-18: qty 1

## 2022-08-18 MED ORDER — METOPROLOL SUCCINATE ER 25 MG PO TB24
25.0000 mg | ORAL_TABLET | Freq: Two times a day (BID) | ORAL | Status: DC
  Administered 2022-08-18: 25 mg via ORAL
  Filled 2022-08-18: qty 1

## 2022-08-18 MED ORDER — PROPOFOL 10 MG/ML IV BOLUS
INTRAVENOUS | Status: DC | PRN
  Administered 2022-08-18: 20 mg via INTRAVENOUS
  Administered 2022-08-18: 120 mg via INTRAVENOUS
  Administered 2022-08-18: 40 mg via INTRAVENOUS

## 2022-08-18 MED ORDER — HYDROCODONE-ACETAMINOPHEN 5-325 MG PO TABS: 1.0000 | ORAL_TABLET | ORAL | Status: DC | PRN

## 2022-08-18 MED ORDER — ACETAMINOPHEN 325 MG PO TABS: 650.0000 mg | ORAL_TABLET | ORAL | Status: DC | PRN

## 2022-08-18 MED ORDER — ESCITALOPRAM OXALATE 20 MG PO TABS
20.0000 mg | ORAL_TABLET | Freq: Every day | ORAL | Status: DC
  Administered 2022-08-18 – 2022-08-19 (×2): 20 mg via ORAL
  Filled 2022-08-18 (×2): qty 1

## 2022-08-18 MED ORDER — SODIUM CHLORIDE 0.9% FLUSH: 3.0000 mL | Freq: Two times a day (BID) | INTRAVENOUS | Status: DC

## 2022-08-18 MED ORDER — LIDOCAINE 2% (20 MG/ML) 5 ML SYRINGE
INTRAMUSCULAR | Status: DC | PRN
  Administered 2022-08-18: 100 mg via INTRAVENOUS

## 2022-08-18 MED ORDER — SUGAMMADEX SODIUM 200 MG/2ML IV SOLN
INTRAVENOUS | Status: DC | PRN
  Administered 2022-08-18 (×2): 100 mg via INTRAVENOUS

## 2022-08-18 MED ORDER — AMISULPRIDE (ANTIEMETIC) 5 MG/2ML IV SOLN: 10.0000 mg | Freq: Once | INTRAVENOUS | Status: DC | PRN

## 2022-08-18 MED ORDER — ROCURONIUM BROMIDE 10 MG/ML (PF) SYRINGE
PREFILLED_SYRINGE | INTRAVENOUS | Status: AC
  Filled 2022-08-18: qty 10

## 2022-08-18 MED ORDER — LACTATED RINGERS IV SOLN: INTRAVENOUS | Status: DC

## 2022-08-18 MED ORDER — PROPOFOL 10 MG/ML IV BOLUS
INTRAVENOUS | Status: AC
  Filled 2022-08-18: qty 20

## 2022-08-18 MED ORDER — ACETAMINOPHEN 500 MG PO TABS
1000.0000 mg | ORAL_TABLET | Freq: Four times a day (QID) | ORAL | Status: DC | PRN
  Administered 2022-08-18 – 2022-08-19 (×3): 1000 mg via ORAL
  Filled 2022-08-18 (×3): qty 2

## 2022-08-18 MED ORDER — CHLORHEXIDINE GLUCONATE 0.12 % MT SOLN
OROMUCOSAL | Status: AC
  Administered 2022-08-18: 15 mL via OROMUCOSAL
  Filled 2022-08-18: qty 15

## 2022-08-18 MED ORDER — DEXAMETHASONE SODIUM PHOSPHATE 10 MG/ML IJ SOLN
INTRAMUSCULAR | Status: AC
  Filled 2022-08-18: qty 1

## 2022-08-18 MED ORDER — ACETAMINOPHEN 325 MG PO TABS: 325.0000 mg | ORAL_TABLET | ORAL | Status: DC | PRN

## 2022-08-18 MED ORDER — ONDANSETRON HCL 4 MG/2ML IJ SOLN: 4.0000 mg | Freq: Four times a day (QID) | INTRAMUSCULAR | Status: DC | PRN

## 2022-08-18 MED ORDER — MENTHOL 3 MG MT LOZG
1.0000 | LOZENGE | OROMUCOSAL | Status: DC | PRN
  Administered 2022-08-19: 3 mg via ORAL
  Filled 2022-08-18: qty 9

## 2022-08-18 MED ORDER — THROMBIN 20000 UNITS EX SOLR
CUTANEOUS | Status: DC | PRN
  Administered 2022-08-18: 20 mL

## 2022-08-18 MED ORDER — CHLORHEXIDINE GLUCONATE 0.12 % MT SOLN: 15.0000 mL | Freq: Once | OROMUCOSAL | Status: AC

## 2022-08-18 MED ORDER — PROMETHAZINE HCL 25 MG/ML IJ SOLN: 6.2500 mg | INTRAMUSCULAR | Status: DC | PRN

## 2022-08-18 MED ORDER — PHENYLEPHRINE 80 MCG/ML (10ML) SYRINGE FOR IV PUSH (FOR BLOOD PRESSURE SUPPORT)
PREFILLED_SYRINGE | INTRAVENOUS | Status: AC
  Filled 2022-08-18: qty 10

## 2022-08-18 MED ORDER — EPHEDRINE SULFATE-NACL 50-0.9 MG/10ML-% IV SOSY
PREFILLED_SYRINGE | INTRAVENOUS | Status: DC | PRN
  Administered 2022-08-18: 10 mg via INTRAVENOUS
  Administered 2022-08-18: 5 mg via INTRAVENOUS

## 2022-08-18 MED ORDER — LIDOCAINE-EPINEPHRINE 0.5 %-1:200000 IJ SOLN
INTRAMUSCULAR | Status: AC
  Filled 2022-08-18: qty 1

## 2022-08-18 MED ORDER — PHENYLEPHRINE 80 MCG/ML (10ML) SYRINGE FOR IV PUSH (FOR BLOOD PRESSURE SUPPORT)
PREFILLED_SYRINGE | INTRAVENOUS | Status: DC | PRN
  Administered 2022-08-18: 120 ug via INTRAVENOUS
  Administered 2022-08-18: 160 ug via INTRAVENOUS
  Administered 2022-08-18 (×2): 80 ug via INTRAVENOUS
  Administered 2022-08-18: 160 ug via INTRAVENOUS
  Administered 2022-08-18: 120 ug via INTRAVENOUS

## 2022-08-18 MED ORDER — ORAL CARE MOUTH RINSE: 15.0000 mL | Freq: Once | OROMUCOSAL | Status: AC

## 2022-08-18 MED ORDER — ROCURONIUM BROMIDE 10 MG/ML (PF) SYRINGE
PREFILLED_SYRINGE | INTRAVENOUS | Status: DC | PRN
  Administered 2022-08-18: 10 mg via INTRAVENOUS
  Administered 2022-08-18: 50 mg via INTRAVENOUS
  Administered 2022-08-18: 20 mg via INTRAVENOUS

## 2022-08-18 MED ORDER — DOCUSATE SODIUM 100 MG PO CAPS
100.0000 mg | ORAL_CAPSULE | Freq: Two times a day (BID) | ORAL | Status: DC
  Administered 2022-08-18 – 2022-08-19 (×2): 100 mg via ORAL
  Filled 2022-08-18 (×2): qty 1

## 2022-08-18 MED ORDER — SODIUM CHLORIDE 0.9% FLUSH: 3.0000 mL | INTRAVENOUS | Status: DC | PRN

## 2022-08-18 MED ORDER — LIDOCAINE-EPINEPHRINE 0.5 %-1:200000 IJ SOLN
INTRAMUSCULAR | Status: DC | PRN
  Administered 2022-08-18: 28 mL

## 2022-08-18 MED ORDER — ACETAMINOPHEN 650 MG RE SUPP: 650.0000 mg | RECTAL | Status: DC | PRN

## 2022-08-18 MED ORDER — OXYCODONE HCL 5 MG PO TABS: 10.0000 mg | ORAL_TABLET | ORAL | Status: DC | PRN

## 2022-08-18 MED ORDER — FENTANYL CITRATE (PF) 100 MCG/2ML IJ SOLN: 25.0000 ug | INTRAMUSCULAR | Status: DC | PRN

## 2022-08-18 MED ORDER — ACETAMINOPHEN 10 MG/ML IV SOLN: 1000.0000 mg | Freq: Once | INTRAVENOUS | Status: DC | PRN

## 2022-08-18 MED ORDER — LIDOCAINE 2% (20 MG/ML) 5 ML SYRINGE
INTRAMUSCULAR | Status: AC
  Filled 2022-08-18: qty 10

## 2022-08-18 MED ORDER — SUCCINYLCHOLINE CHLORIDE 200 MG/10ML IV SOSY
PREFILLED_SYRINGE | INTRAVENOUS | Status: DC | PRN
  Administered 2022-08-18: 120 mg via INTRAVENOUS

## 2022-08-18 MED ORDER — VANCOMYCIN HCL IN DEXTROSE 1-5 GM/200ML-% IV SOLN
INTRAVENOUS | Status: AC
  Administered 2022-08-18: 1000 mg via INTRAVENOUS
  Filled 2022-08-18: qty 200

## 2022-08-18 MED ORDER — BACITRACIN ZINC 500 UNIT/GM EX OINT
TOPICAL_OINTMENT | CUTANEOUS | Status: DC | PRN
  Administered 2022-08-18: 1 via TOPICAL

## 2022-08-18 MED ORDER — PHENYLEPHRINE HCL-NACL 20-0.9 MG/250ML-% IV SOLN
INTRAVENOUS | Status: DC | PRN
  Administered 2022-08-18: 40 ug/min via INTRAVENOUS

## 2022-08-18 MED ORDER — ONDANSETRON HCL 4 MG/2ML IJ SOLN
INTRAMUSCULAR | Status: DC | PRN
  Administered 2022-08-18: 4 mg via INTRAVENOUS

## 2022-08-18 MED ORDER — VANCOMYCIN HCL IN DEXTROSE 1-5 GM/200ML-% IV SOLN: 1000.0000 mg | INTRAVENOUS | Status: AC

## 2022-08-18 MED ORDER — POTASSIUM CHLORIDE IN NACL 20-0.9 MEQ/L-% IV SOLN: INTRAVENOUS | Status: DC

## 2022-08-18 MED ORDER — FUROSEMIDE 20 MG PO TABS
20.0000 mg | ORAL_TABLET | Freq: Every day | ORAL | Status: DC
  Administered 2022-08-19: 20 mg via ORAL
  Filled 2022-08-18: qty 1

## 2022-08-18 MED ORDER — DEXAMETHASONE SODIUM PHOSPHATE 10 MG/ML IJ SOLN
INTRAMUSCULAR | Status: DC | PRN
  Administered 2022-08-18: 10 mg via INTRAVENOUS

## 2022-08-18 MED ORDER — 0.9 % SODIUM CHLORIDE (POUR BTL) OPTIME
TOPICAL | Status: DC | PRN
  Administered 2022-08-18: 1000 mL

## 2022-08-18 MED ORDER — DIAZEPAM 5 MG PO TABS: 5.0000 mg | ORAL_TABLET | Freq: Four times a day (QID) | ORAL | Status: DC | PRN

## 2022-08-18 MED ORDER — ONDANSETRON HCL 4 MG PO TABS: 4.0000 mg | ORAL_TABLET | Freq: Four times a day (QID) | ORAL | Status: DC | PRN

## 2022-08-18 MED ORDER — ONDANSETRON HCL 4 MG/2ML IJ SOLN
INTRAMUSCULAR | Status: AC
  Filled 2022-08-18: qty 2

## 2022-08-18 MED ORDER — ALUM & MAG HYDROXIDE-SIMETH 200-200-20 MG/5ML PO SUSP: 30.0000 mL | Freq: Four times a day (QID) | ORAL | Status: DC | PRN

## 2022-08-18 MED ORDER — ALPRAZOLAM 0.5 MG PO TABS
1.0000 mg | ORAL_TABLET | Freq: Two times a day (BID) | ORAL | Status: DC | PRN
  Administered 2022-08-19: 1 mg via ORAL
  Filled 2022-08-18: qty 2

## 2022-08-18 MED ORDER — ATORVASTATIN CALCIUM 40 MG PO TABS
40.0000 mg | ORAL_TABLET | Freq: Every day | ORAL | Status: DC
  Administered 2022-08-18 – 2022-08-19 (×2): 40 mg via ORAL
  Filled 2022-08-18 (×2): qty 1

## 2022-08-18 MED ORDER — PANTOPRAZOLE SODIUM 20 MG PO TBEC
20.0000 mg | DELAYED_RELEASE_TABLET | Freq: Every day | ORAL | Status: DC
  Administered 2022-08-19: 20 mg via ORAL
  Filled 2022-08-18: qty 1

## 2022-08-18 MED ORDER — CHLORHEXIDINE GLUCONATE CLOTH 2 % EX PADS: 6.0000 | MEDICATED_PAD | Freq: Once | CUTANEOUS | Status: DC

## 2022-08-18 MED ORDER — FLEET ENEMA 7-19 GM/118ML RE ENEM: 1.0000 | ENEMA | Freq: Once | RECTAL | Status: DC | PRN

## 2022-08-18 MED ORDER — MECLIZINE HCL 25 MG PO TABS: 25.0000 mg | ORAL_TABLET | Freq: Three times a day (TID) | ORAL | Status: DC | PRN

## 2022-08-18 MED ORDER — THROMBIN 20000 UNITS EX SOLR
CUTANEOUS | Status: AC
  Filled 2022-08-18: qty 20000

## 2022-08-18 MED ORDER — BISACODYL 5 MG PO TBEC: 5.0000 mg | DELAYED_RELEASE_TABLET | Freq: Every day | ORAL | Status: DC | PRN

## 2022-08-18 MED ORDER — CELECOXIB 200 MG PO CAPS
200.0000 mg | ORAL_CAPSULE | Freq: Two times a day (BID) | ORAL | Status: DC
  Administered 2022-08-18 – 2022-08-19 (×2): 200 mg via ORAL
  Filled 2022-08-18 (×2): qty 1

## 2022-08-18 MED ORDER — BACITRACIN ZINC 500 UNIT/GM EX OINT
TOPICAL_OINTMENT | CUTANEOUS | Status: AC
  Filled 2022-08-18: qty 28.35

## 2022-08-18 MED ORDER — EPHEDRINE 5 MG/ML INJ
INTRAVENOUS | Status: AC
  Filled 2022-08-18: qty 5

## 2022-08-18 MED ORDER — SUCCINYLCHOLINE CHLORIDE 200 MG/10ML IV SOSY
PREFILLED_SYRINGE | INTRAVENOUS | Status: AC
  Filled 2022-08-18: qty 10

## 2022-08-18 MED ORDER — FENTANYL CITRATE (PF) 250 MCG/5ML IJ SOLN
INTRAMUSCULAR | Status: AC
  Filled 2022-08-18: qty 5

## 2022-08-18 MED ORDER — ACETAMINOPHEN 160 MG/5ML PO SOLN: 325.0000 mg | ORAL | Status: DC | PRN

## 2022-08-18 MED ORDER — POLYETHYLENE GLYCOL 3350 17 G PO PACK: 17.0000 g | PACK | Freq: Every day | ORAL | Status: DC | PRN

## 2022-08-18 MED ORDER — SODIUM CHLORIDE 0.9 % IV SOLN
250.0000 mL | INTRAVENOUS | Status: DC
  Administered 2022-08-18: 250 mL via INTRAVENOUS

## 2022-08-18 MED ORDER — HYDROCODONE-ACETAMINOPHEN 7.5-325 MG PO TABS: 1.0000 | ORAL_TABLET | Freq: Four times a day (QID) | ORAL | Status: DC

## 2022-08-18 MED ORDER — ASPIRIN 81 MG PO TBEC
81.0000 mg | DELAYED_RELEASE_TABLET | Freq: Every day | ORAL | Status: DC
  Administered 2022-08-18 – 2022-08-19 (×2): 81 mg via ORAL
  Filled 2022-08-18 (×2): qty 1

## 2022-08-18 MED ORDER — FENTANYL CITRATE (PF) 250 MCG/5ML IJ SOLN
INTRAMUSCULAR | Status: DC | PRN
  Administered 2022-08-18 (×2): 50 ug via INTRAVENOUS

## 2022-08-18 MED ORDER — PHENOL 1.4 % MT LIQD
1.0000 | OROMUCOSAL | Status: DC | PRN
  Administered 2022-08-19: 1 via OROMUCOSAL
  Filled 2022-08-18: qty 177

## 2022-08-18 SURGICAL SUPPLY — 53 items
BAG COUNTER SPONGE SURGICOUNT (BAG) ×1 IMPLANT
BENZOIN TINCTURE PRP APPL 2/3 (GAUZE/BANDAGES/DRESSINGS) ×2 IMPLANT
BIT DRILL 2.4 (BIT) IMPLANT
BIT DRILL NEURO 2X3.1 SFT TUCH (MISCELLANEOUS) IMPLANT
BLADE CLIPPER SURG (BLADE) ×1 IMPLANT
BLADE ULTRA TIP 2M (BLADE) IMPLANT
BUR MATCHSTICK NEURO 3.0 LAGG (BURR) ×1 IMPLANT
CANISTER SUCT 3000ML PPV (MISCELLANEOUS) ×1 IMPLANT
COVERAGE SUPPORT O-ARM STEALTH (MISCELLANEOUS) IMPLANT
DRAPE C-ARM 42X72 X-RAY (DRAPES) ×2 IMPLANT
DRAPE LAPAROTOMY 100X72 PEDS (DRAPES) ×1 IMPLANT
DRILL NEURO 2X3.1 SOFT TOUCH (MISCELLANEOUS) ×1
DRSG OPSITE POSTOP 4X6 (GAUZE/BANDAGES/DRESSINGS) IMPLANT
DURAPREP 6ML APPLICATOR 50/CS (WOUND CARE) ×1 IMPLANT
ELECT REM PT RETURN 9FT ADLT (ELECTROSURGICAL) ×1
ELECTRODE REM PT RTRN 9FT ADLT (ELECTROSURGICAL) ×1 IMPLANT
FEE COVERAGE SUPPORT O-ARM (MISCELLANEOUS) IMPLANT
GAUZE 4X4 16PLY ~~LOC~~+RFID DBL (SPONGE) IMPLANT
GAUZE SPONGE 4X4 12PLY STRL (GAUZE/BANDAGES/DRESSINGS) IMPLANT
GLOVE ECLIPSE 6.5 STRL STRAW (GLOVE) ×1 IMPLANT
GLOVE EXAM NITRILE XL STR (GLOVE) IMPLANT
GOWN STRL REUS W/ TWL LRG LVL3 (GOWN DISPOSABLE) IMPLANT
GOWN STRL REUS W/ TWL XL LVL3 (GOWN DISPOSABLE) ×1 IMPLANT
GOWN STRL REUS W/TWL 2XL LVL3 (GOWN DISPOSABLE) IMPLANT
GOWN STRL REUS W/TWL LRG LVL3 (GOWN DISPOSABLE)
GOWN STRL REUS W/TWL XL LVL3 (GOWN DISPOSABLE) ×1
KIT BASIN OR (CUSTOM PROCEDURE TRAY) ×1 IMPLANT
KIT INFUSE X SMALL 1.4CC (Orthopedic Implant) IMPLANT
KIT TURNOVER KIT B (KITS) ×1 IMPLANT
NDL HYPO 25X1 1.5 SAFETY (NEEDLE) ×1 IMPLANT
NEEDLE HYPO 25X1 1.5 SAFETY (NEEDLE) ×1 IMPLANT
NS IRRIG 1000ML POUR BTL (IV SOLUTION) ×1 IMPLANT
PACK LAMINECTOMY NEURO (CUSTOM PROCEDURE TRAY) ×1 IMPLANT
PAD ARMBOARD 7.5X6 YLW CONV (MISCELLANEOUS) ×3 IMPLANT
PIN MAYFIELD SKULL DISP (PIN) ×1 IMPLANT
ROD PRE CUT 3.5X25 (Rod) IMPLANT
SCREW MULTI AXIAL 3.5X14MM (Screw) IMPLANT
SET SCREW INFINITY IFIX THOR (Screw) IMPLANT
SPIKE FLUID TRANSFER (MISCELLANEOUS) ×1 IMPLANT
SPONGE SURGIFOAM ABS GEL 100 (HEMOSTASIS) ×1 IMPLANT
SPONGE T-LAP 4X18 ~~LOC~~+RFID (SPONGE) IMPLANT
STAPLER SKIN PROX WIDE 3.9 (STAPLE) ×1 IMPLANT
STRIP CLOSURE SKIN 1/2X4 (GAUZE/BANDAGES/DRESSINGS) IMPLANT
SUT ETHILON 3 0 FSL (SUTURE) IMPLANT
SUT VIC AB 0 CT1 18XCR BRD8 (SUTURE) ×1 IMPLANT
SUT VIC AB 0 CT1 8-18 (SUTURE) ×1
SUT VIC AB 2-0 CT1 18 (SUTURE) ×1 IMPLANT
SUT VIC AB 3-0 SH 8-18 (SUTURE) ×1 IMPLANT
TAPE CLOTH 3X10 TAN LF (GAUZE/BANDAGES/DRESSINGS) ×1 IMPLANT
TOWEL GREEN STERILE (TOWEL DISPOSABLE) ×1 IMPLANT
TOWEL GREEN STERILE FF (TOWEL DISPOSABLE) ×1 IMPLANT
UNDERPAD 30X36 HEAVY ABSORB (UNDERPADS AND DIAPERS) ×1 IMPLANT
WATER STERILE IRR 1000ML POUR (IV SOLUTION) ×1 IMPLANT

## 2022-08-18 NOTE — Anesthesia Procedure Notes (Addendum)
Procedure Name: Intubation Date/Time: 08/18/2022 2:00 PM  Performed by: Erick Colace, CRNAPre-anesthesia Checklist: Patient identified, Emergency Drugs available, Suction available and Patient being monitored Patient Re-evaluated:Patient Re-evaluated prior to induction Oxygen Delivery Method: Circle system utilized Preoxygenation: Pre-oxygenation with 100% oxygen Induction Type: IV induction and Rapid sequence Ventilation: Mask ventilation without difficulty Laryngoscope Size: Glidescope and 4 Grade View: Grade I Tube type: Oral Tube size: 7.5 mm Number of attempts: 1 Airway Equipment and Method: Stylet and Oral airway Placement Confirmation: ETT inserted through vocal cords under direct vision, positive ETCO2 and breath sounds checked- equal and bilateral Secured at: 24 cm Tube secured with: Tape Dental Injury: Teeth and Oropharynx as per pre-operative assessment  Difficulty Due To: Difficulty was anticipated, Difficult Airway- due to cervical collar, Difficult Airway-  due to neck instability and Difficult Airway- due to anterior larynx Comments: Head and neck maintained in C collar and neutral position throughout induction and intubation.

## 2022-08-18 NOTE — Transfer of Care (Signed)
Immediate Anesthesia Transfer of Care Note  Patient: Francisco [name]  Procedure(s) Performed: C4-5 Posterior cervical fusion with lateral mass fixation (Spine Cervical)  Patient Location: PACU  Anesthesia Type:General  Level of Consciousness: drowsy and patient cooperative  Airway & Oxygen Therapy: Patient Spontanous Breathing and Patient connected to face mask oxygen  Post-op Assessment: Report given to RN, Post -op Vital signs reviewed and stable and Patient moving all extremities X 4  Post vital signs: Reviewed and stable  Last Vitals:  Vitals Value Taken Time  BP 128/89 08/18/22 1615  Temp    Pulse 82 08/18/22 1619  Resp 20 08/18/22 1619  SpO2 98 % 08/18/22 1619  Vitals shown include unvalidated device data.  Last Pain:  Vitals:   08/18/22 1124  TempSrc:   PainSc: 0-No pain         Complications:  Encounter Notable Events  Notable Event Outcome Phase Comment  Difficult to intubate - expected  Intraprocedure Filed from anesthesia note documentation.

## 2022-08-18 NOTE — H&P (Signed)
BP (!) 140/83   Pulse (!) 53   Temp 97.9 F (36.6 C) (Oral)   Resp 20   Ht '5\' 9"'$  (1.753 m)   Wt 81.6 kg   SpO2 96%   BMI 26.57 kg/m  Francisco [name] is a 81 y.o. male Who fell this past weekend resulting in a C4 fracture and rupture of the All at C4/5. No neurological deficits from the fracture.  This is and unstable fracture and he has worn a collar.  Allergies  Allergen Reactions   Crestor [Rosuvastatin] Other (See Comments)    Unknown per Pt   Lipitor [Atorvastatin] Other (See Comments)    Unknown per Pt   Penicillins     REACTION: swelling/hives Has patient had a PCN reaction causing immediate rash, facial/tongue/throat swelling, SOB or lightheadedness with hypotension:yes Has patient had a PCN reaction causing severe rash involving mucus membranes or skin necrosis: Yes Has patient had a PCN reaction that required hospitalization No Has patient had a PCN reaction occurring within the last 10 years: No If all of the above answers are "NO", then may proceed with Cephalosporin use.    Symbicort [Budesonide-Formoterol Fumarate] Other (See Comments)    Pain Behind ribs Unknown per Pt   Past Medical History:  Diagnosis Date   Anxiety    GERD (gastroesophageal reflux disease)    Hypertension    Vertigo    Past Surgical History:  Procedure Laterality Date   AMPUTATION Left 08/05/2016   Procedure: REVISION AMPUTATION LEFT INDEX FINGER REPAIR LACERATION LEFT THUMB;  Surgeon: [name], MD;  Location: [facility];  Service: Orthopedics;  Laterality: Left;   BILIARY STENT PLACEMENT N/A 07/09/2022   Procedure: BILIARY STENT PLACEMENT;  Surgeon: Ladene Artist, MD;  Location: [facility];  Service: Gastroenterology;  Laterality: N/A;   CHOLECYSTECTOMY N/A 07/05/2022   Procedure: LAPAROSCOPIC CHOLECYSTECTOMY;  Surgeon: Dwan Bolt, MD;  Location: [facility];  Service: General;  Laterality: N/A;   ERCP N/A 07/09/2022   Procedure: ENDOSCOPIC RETROGRADE CHOLANGIOPANCREATOGRAPHY (ERCP);   Surgeon: Ladene Artist, MD;  Location: [facility];  Service: Gastroenterology;  Laterality: N/A;   SPHINCTEROTOMY  07/09/2022   Procedure: SPHINCTEROTOMY;  Surgeon: Ladene Artist, MD;  Location: [facility];  Service: Gastroenterology;;   SPINE SURGERY     Family History  Problem Relation Age of Onset   Healthy Mother    Transient ischemic attack Father    Rheum arthritis Daughter    Amblyopia Neg Hx    Blindness Neg Hx    Cataracts Neg Hx    Glaucoma Neg Hx    Macular degeneration Neg Hx    Retinal detachment Neg Hx    Strabismus Neg Hx    Retinitis pigmentosa Neg Hx    Social History   Socioeconomic History   Marital status: Widowed    Spouse name: Not on file   Number of children: 2   Years of education: Not on file   Highest education level: Not on file  Occupational History   Occupation: Retired    Fish farm manager: [employer]  Tobacco Use   Smoking status: Former    Types: Cigarettes    Quit date: 02/23/1972    Years since quitting: 50.5   Smokeless tobacco: Never  Vaping Use   Vaping Use: Never used  Substance and Sexual Activity   Alcohol use: Yes    Alcohol/week: 1.0 standard drink of alcohol    Types: 1 Standard drinks or equivalent per week  Comment: occasional beer   Drug use: No   Sexual activity: Not Currently  Other Topics Concern   Not on file  Social History Narrative   2 daughters nearby   Social Determinants of Health   Financial Resource Strain: Low Risk  (08/08/2021)   Overall Financial Resource Strain (CARDIA)    Difficulty of Paying Living Expenses: Not hard at all  Food Insecurity: No Food Insecurity (08/08/2021)   Hunger Vital Sign    Worried About Running Out of Food in the Last Year: Never true    Ran Out of Food in the Last Year: Never true  Transportation Needs: No Transportation Needs (08/08/2021)   PRAPARE - Hydrologist (Medical): No    Lack of Transportation (Non-Medical): No  Physical Activity:  Inactive (08/08/2021)   Exercise Vital Sign    Days of Exercise per Week: 0 days    Minutes of Exercise per Session: 0 min  Stress: No Stress Concern Present (08/08/2021)   [place]    Feeling of Stress : Not at all  Social Connections: Socially Isolated (08/08/2021)   Social Connection and Isolation Panel [NHANES]    Frequency of Communication with Friends and Family: More than three times a week    Frequency of Social Gatherings with Friends and Family: More than three times a week    Attends Religious Services: Never    Marine scientist or Organizations: No    Attends Archivist Meetings: Never    Marital Status: Widowed  Intimate Partner Violence: Not At Risk (08/08/2021)   Humiliation, Afraid, Rape, and Kick questionnaire    Fear of Current or Ex-Partner: No    Emotionally Abused: No    Physically Abused: No    Sexually Abused: No   Prior to Admission medications   Medication Sig Start Date End Date Taking? Authorizing Provider  acetaminophen (TYLENOL) 325 MG tablet Take 650 mg by mouth every 6 (six) hours as needed for mild pain or fever.   Yes [provider]  ALPRAZolam Duanne Moron) 1 MG tablet Take 1 tablet (1 mg total) by mouth 2 (two) times daily as needed for anxiety. TAKE 1 TABLET BY MOUTH TWICE  DAILY AS NEEDED FOR ANXIETY OR  SLEEP 08/17/22  Yes Hassell Done, Mary-Margaret, FNP  aspirin 81 MG tablet Take 81 mg by mouth daily.   Yes [provider]  docusate sodium (COLACE) 100 MG capsule Take 1 capsule (100 mg total) by mouth 2 (two) times daily. Patient taking differently: Take 100 mg by mouth daily as needed for mild constipation or moderate constipation. 07/14/22  Yes Meuth, Brooke A, PA-C  escitalopram (LEXAPRO) 10 MG tablet Take 2 tablets (20 mg total) by mouth daily. 08/14/22  Yes Martin, Mary-Margaret, FNP  fish oil-omega-3 fatty acids 1000 MG capsule Take 2 g by mouth daily.   Yes  [provider]  furosemide (LASIX) 20 MG tablet Take 1 tablet (20 mg total) by mouth daily. 08/14/22  Yes Martin, Mary-Margaret, FNP  meclizine (ANTIVERT) 25 MG tablet TAKE 1 TABLET BY MOUTH 3  TIMES DAILY AS NEEDED FOR  DIZZINESS Patient taking differently: Take 25 mg by mouth 3 (three) times daily as needed for dizziness. 03/02/22  Yes Hassell Done, Mary-Margaret, FNP  metoprolol succinate (TOPROL-XL) 25 MG 24 hr tablet TAKE 1 TABLET BY MOUTH TWICE  DAILY 07/13/22  Yes Park Liter, MD  pantoprazole (PROTONIX) 20 MG tablet Take 1  tablet (20 mg total) by mouth daily. 08/14/22  Yes Martin, Mary-Margaret, FNP  polyethylene glycol (MIRALAX / GLYCOLAX) 17 g packet Take 17 g by mouth 2 (two) times daily. Patient taking differently: Take 17 g by mouth 2 (two) times daily as needed for mild constipation or moderate constipation. 07/14/22  Yes Meuth, Brooke A, PA-C  simvastatin (ZOCOR) 80 MG tablet Take 1 tablet (80 mg total) by mouth daily. 08/14/22  Yes Hassell Done, Mary-Margaret, FNP  tamsulosin (FLOMAX) 0.4 MG CAPS capsule Take 1 capsule (0.4 mg total) by mouth daily. 08/14/22  Yes Hassell Done, Mary-Margaret, FNP  oxyCODONE (OXY IR/ROXICODONE) 5 MG immediate release tablet Take 1 tablet (5 mg total) by mouth every 6 (six) hours as needed for moderate pain or severe pain. Patient not taking: Reported on 08/14/2022 07/14/22   Wellington Hampshire, PA-C   MR Cervical Spine Wo Contrast  Result Date: 08/12/2022 CLINICAL DATA:  81 year old male status post fall. Previous C5 through C7 ACDF with anterior inferior C4 corner fracture on CT this morning. Questionable abnormal widening of the C4-C5 disc space. Subsequent encounter. EXAM: MRI CERVICAL SPINE WITHOUT CONTRAST TECHNIQUE: Multiplanar, multisequence MR imaging of the cervical spine was performed. No intravenous contrast was administered. COMPARISON:  Cervical spine CT 0347 hours today. Cervical spine MRI 09/22/2005. FINDINGS: Alignment: Stable from the CT earlier  today and not significantly changed from the 2006 MRI. Mild degenerative appearing anterolisthesis of C7 on T1. Vertebrae: C4 anterior inferior corner fracture better demonstrated by CT. Mild marrow edema there on MRI (series 7, image 9). C5 through C7 ACDF hardware with mild susceptibility artifact. No other marrow edema identified and normal background bone marrow signal. However, conspicuous increased STIR signal in the bilateral C4-C5 facets (series 7 images 3 and 15. Cord: Spinal cord mass effect at C4-C5 but no associated cord signal abnormality. Hardware artifact on T2 * imaging, no cord contusion is evident. Negative visible upper thoracic spinal cord. Posterior Fossa, vertebral arteries, paraspinal tissues: Cervicomedullary junction is within normal limits. Negative visible posterior fossa. Preserved major vascular flow voids in the neck. Bulky prevertebral edema or hematoma tracking from C5 cephalad (series 7, image 9), up to 14 mm in thickness. Mild mass effect on the posterior pharynx. Evidence of disrupted anterior longitudinal ligament at the inferior C4 level corresponding to the fracture site. And there is conspicuous increased STIR signal in both the C4-C5 disc space, and both facets at that level. Mild superimposed interspinous ligament signal there also. Facets remain normally aligned by CT this morning. No other discrete cervical ligamentous injury. Negative visible lung apices. Disc levels: C2-C3: Interbody and posterior element ankylosis here by CT this morning. No significant spinal stenosis. C3-C4: Severe disc space loss with circumferential disc osteophyte complex. Possible developing interbody ankylosis here by CT. Mild to moderate facet and ligament flavum hypertrophy. Spinal stenosis with mild spinal cord mass effect (series 8, image 12), no cord signal abnormality. Severe bilateral C4 foraminal stenosis. C4-C5: Conspicuous abnormal signal in the disc space and facets here as above.  Circumferential disc osteophyte complex with mild to moderate facet and ligament flavum hypertrophy. No discrete epidural hemorrhage. But mild spinal stenosis and cord mass effect. No cord signal abnormality. Up to moderate bilateral C5 foraminal stenosis. C5-C6:  ACDF with solid arthrodesis and no stenosis. C6-C7: ACDF with solid arthrodesis. Residual facet hypertrophy on the right. Mild right osseous foraminal stenosis. C7-T1: Posterior element ankylosis here by CT. Facet and endplate spurring, no significant stenosis. IMPRESSION: 1. Prior cervical spine surgery  with solid fusion C5 through C7. 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 2. Subsequent C4-C5 disc space injury with fractured anterior inferior C4 endplate as demonstrated by CT. Evidence of disrupted C4-C5 anterior longitudinal ligament, disc, and bilateral facet joints (with increased STIR signal throughout), but maintained alignment. Multifactorial mild spinal stenosis and mild spinal cord mass effect there, but NO cord contusion or edema identified. This could be an unstable injury and Recommend Neurosurgery consultation if not already done. 3. Prevertebral edema/hematoma associated with #2. No epidural hematoma identified. 4. 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 Severe bilateral C4 foraminal stenosis. Electronically Signed   By: Genevie Ann M.D.   On: 08/12/2022 07:58   CT CERVICAL SPINE WO CONTRAST  Addendum Date: 08/12/2022   ADDENDUM REPORT: 08/12/2022 04:13 ADDENDUM: These results were called by telephone at the time of interpretation on 08/12/2022 at 4:11 am to provider [facility] , who verbally acknowledged these results. Electronically Signed   By: Placido Sou M.D.   On: 08/12/2022 04:13   Result Date: 08/12/2022 CLINICAL DATA:  Neck trauma EXAM: CT CERVICAL SPINE WITHOUT CONTRAST  TECHNIQUE: Multidetector CT imaging of the cervical spine was performed without intravenous contrast. Multiplanar CT image reconstructions were also generated. RADIATION DOSE REDUCTION: This exam was performed according to the departmental dose-optimization program which includes automated exposure control, adjustment of the mA and/or kV according to patient size and/or use of iterative reconstruction technique. COMPARISON:  None available FINDINGS: Alignment: Slight anterolisthesis C7 on T1, presumed chronic. Skull base and vertebrae: Acute mildly displaced oblique fracture through the anterior inferior endplate of C4. Questionable widening of the C4-C5 interspace anteriorly. No retropulsion. No additional fractures. Soft tissues and spinal canal: Mild prevertebral soft tissue thickening about the C4 inferior endplate fracture. No visible canal hematoma. Disc levels: ACDF C5-C7. Multilevel advanced spondylosis and disc space height loss. Multilevel advanced facet arthropathy. No high-grade spinal canal narrowing. Posterior disc osteophyte complex at C3-C4 causes mild effacement of the ventral thecal sac. Uncovertebral spurring and facet arthropathy cause neural foraminal narrowing most marked at C3-C4 bilaterally where it is advanced. Upper chest: Emphysema.  No acute abnormality. Other: None. IMPRESSION: Acute mildly displaced fracture of the anterior inferior corner of C4 with adjacent prevertebral edema. Questionable widening of the C4-C5 disc space anteriorly. MRI may be helpful for further evaluation for ligamentous injury. Electronically Signed: By: Placido Sou M.D. On: 08/12/2022 04:05   CT HEAD WO CONTRAST (5MM)  Result Date: 08/12/2022 CLINICAL DATA:  Fall, head injury EXAM: CT HEAD WITHOUT CONTRAST TECHNIQUE: Contiguous axial images were obtained from the base of the skull through the vertex without intravenous contrast. RADIATION DOSE REDUCTION: This exam was performed according to the  departmental dose-optimization program which includes automated exposure control, adjustment of the mA and/or kV according to patient size and/or use of iterative reconstruction technique. COMPARISON:  None Available. FINDINGS: Brain: Normal anatomic configuration. Parenchymal volume loss is commensurate with the patient's age. Mild periventricular white matter changes are present likely reflecting the sequela of small vessel ischemia. No abnormal intra or extra-axial mass lesion or fluid collection. No abnormal mass effect or midline shift. No evidence of acute intracranial hemorrhage or infarct. Ventricular size is normal. Cerebellum unremarkable. Vascular: No asymmetric hyperdense vasculature at the skull base. Skull: Intact Sinuses/Orbits: Moderate mucosal thickening within the visualized left maxillary sinus. Small mucous retention cyst or polyp within the left  ethmoid air cells. No air-fluid levels. Remaining paranasal sinuses are clear. Orbits are unremarkable. Other: Mastoid air cells and middle ear cavities are clear. IMPRESSION: 1. No acute intracranial abnormality. No calvarial fracture. 2. Mild senescent change. 3. Moderate left maxillary sinus disease. Electronically Signed   By: Fidela Salisbury M.D.   On: 08/12/2022 04:02   CT ABDOMEN PELVIS W CONTRAST  Result Date: 07/12/2022 CLINICAL DATA:  One week postop from cholecystectomy. Worsening abdominal pain. EXAM: CT ABDOMEN AND PELVIS WITH CONTRAST TECHNIQUE: Multidetector CT imaging of the abdomen and pelvis was performed using the standard protocol following bolus administration of intravenous contrast. RADIATION DOSE REDUCTION: This exam was performed according to the departmental dose-optimization program which includes automated exposure control, adjustment of the mA and/or kV according to patient size and/or use of iterative reconstruction technique. CONTRAST:  145m OMNIPAQUE IOHEXOL 300 MG/ML  SOLN COMPARISON:  05/05/2022 FINDINGS: Lower  Chest: New small right pleural effusion and dependent atelectasis are seen. Hepatobiliary: Several sub-centimeter low-attenuation lesions in the left hepatic lobe show no significant change, likely representing tiny cysts. Patient has undergone cholecystectomy since prior exam. Surgical drain is seen in the gallbladder fossa and internal biliary stent is also seen in the distal common bile duct. No evidence of biliary ductal dilatation. A small amount of fluid and gas is seen within the cholecystectomy bed. Pancreas:  No mass or inflammatory changes. Spleen: Within normal limits in size and appearance. Adrenals/Urinary Tract: No suspicious masses identified. No evidence of ureteral calculi or hydronephrosis. Stomach/Bowel: Small hiatal hernia again seen. No evidence of obstruction, inflammatory process or abnormal fluid collections. Normal appendix visualized. Colonic diverticulosis is again noted, without evidence of diverticulitis. Vascular/Lymphatic: No pathologically enlarged lymph nodes. No acute vascular findings. Aortic atherosclerotic calcification incidentally noted. Reproductive:  No mass or other significant abnormality. Other:  None. Musculoskeletal:  No suspicious bone lesions identified. IMPRESSION: Postop changes from cholecystectomy. No evidence of biliary ductal dilatation. Small amount of fluid and gas within the gallbladder fossa, which may represent a postop fluid collection or biloma from bile leak. Nuclear medicine hepatobiliary scan could be performed for further evaluation if clinically warranted. New small right pleural effusion and dependent atelectasis. Colonic diverticulosis, without radiographic evidence of diverticulitis. Small hiatal hernia. Aortic Atherosclerosis (ICD10-I70.0). Electronically Signed   By: JMarlaine HindM.D.   On: 07/12/2022 15:07   DG CHEST PORT 1 VIEW  Result Date: 07/10/2022 CLINICAL DATA:  10026; shortness of breath EXAM: PORTABLE CHEST 1 VIEW COMPARISON:   July 05, 2022 FINDINGS: The heart size and mediastinal contours are within normal limits. Stable atheromatous calcifications of the arch of the aorta. Both lungs are clear and stable. The visualized skeletal structures are unremarkable. Again seen are the ACDF changes of the lower cervical spine. IMPRESSION: No active cardiopulmonary disease. Electronically Signed   By: AFrazier RichardsM.D.   On: 07/10/2022 13:18   DG Abd 1 View  Result Date: 07/10/2022 CLINICAL DATA:  Nausea and abdominal pain EXAM: ABDOMEN - 1 VIEW COMPARISON:  2014 FINDINGS: Common bile duct stent is noted. Right upper quadrant drain. Bowel gas pattern is unremarkable. IMPRESSION: Unremarkable bowel gas pattern. Electronically Signed   By: PMacy MisM.D.   On: 07/10/2022 10:09   DG ERCP  Result Date: 07/09/2022 CLINICAL DATA:  ERCP following subtotal cholecystectomy and concern for bile leak EXAM: ERCP TECHNIQUE: Multiple spot images obtained with the fluoroscopic device and submitted for interpretation post-procedure. FLUOROSCOPY TIME: FLUOROSCOPY TIME 1 minute, 47 seconds (22 mGy) COMPARISON:  Right upper quadrant abdominal ultrasound-07/05/2022 Nuclear medicine HIDA scan-07/09/2022 FINDINGS: Twelve spot intraoperative fluoroscopic images of the right upper abdominal quadrant during ERCP are provided for review. Initial image demonstrates an ERCP probe overlying the right upper abdominal quadrant. Cholecystectomy clips overlies expected location of the gallbladder fossa. A large bore surgical drainage catheter overlies the gallbladder fossa Subsequent images demonstrate selective cannulation and opacification of the CBD. There is opacification of the residual cystic duct with extravasation of contrast adjacent to the end of the large bore surgical drainage catheter (representative image 5). Subsequent images demonstrate placement of an internal biliary stent though the superior landing zone of the stent is suboptimally visualized.  IMPRESSION: 1. ERCP with findings suggestive of a biliary leak from the cystic duct remnant. 2. Post biliary stent placement though the superior landing zone of the stent is suboptimally visualized, not definitively across the origin of the cystic duct. Further evaluation with CT could be performed as indicated. These images were submitted for radiologic interpretation only. Please see the procedural report for the amount of contrast and the fluoroscopy time utilized. Electronically Signed   By: Sandi Mariscal M.D.   On: 07/09/2022 16:45   NM HEPATOBILIARY LEAK (POST-SURGICAL)  Result Date: 07/07/2022 CLINICAL DATA:  Subtotal cholecystectomy performed. Postop day 1. Surgical drain in place. Concern for biliary leak EXAM: NUCLEAR MEDICINE HEPATOBILIARY IMAGING TECHNIQUE: Sequential images of the abdomen were obtained out to 60 minutes following intravenous administration of radiopharmaceutical. RADIOPHARMACEUTICALS:  5.3 mCi Tc-77m Choletec IV COMPARISON:  Ultrasound 07/05/2022 FINDINGS: Prompt clearance of radiotracer from blood pool and homogeneous uptake in liver. Counts are evident in the common bile duct and small bowel by 20 minutes. At 15 minutes, counts begin to collect along the inferior margin of the RIGHT hepatic lobe. These counts spill into the peritoneal space along the RIGHT pericolic gutter consistent with biliary leak. IMPRESSION: Biliary leak with biliary excreted radiotracer collecting in the gallbladder fossa and spilling into the RIGHT pericolic gutter. Patent common bile duct. These results will be called to the ordering clinician or representative by the Radiologist Assistant, and communication documented in the PACS or [employer] Electronically Signed   By: SSuzy BouchardM.D.   On: 07/07/2022 16:56   UKoreaAbdomen Limited RUQ (LIVER/GB)  Result Date: 07/05/2022 CLINICAL DATA:  Right upper quadrant pain EXAM: ULTRASOUND ABDOMEN LIMITED RIGHT UPPER QUADRANT COMPARISON:  None  Available. FINDINGS: Gallbladder: Cholelithiasis with the largest gallstone measuring 2.4 cm. No pericholecystic fluid. Top-normal gallbladder wall thickness. Negative sonographic Murphy sign. Common bile duct: Diameter: 5 mm Liver: No focal lesion identified. Within normal limits in parenchymal echogenicity. Portal vein is patent on color Doppler imaging with normal direction of blood flow towards the liver. Other: None. IMPRESSION: 1. Cholelithiasis without sonographic evidence of acute cholecystitis. Electronically Signed   By: HKathreen DevoidM.D.   On: 07/05/2022 11:24   DG Chest 2 View  Result Date: 07/05/2022 CLINICAL DATA:  Pleuritic chest pain. EXAM: CHEST - 2 VIEW COMPARISON:  05/05/2022 FINDINGS: The heart size and mediastinal contours are unremarkable. There is no pleural effusion or edema. No airspace opacities identified. Signs of previous ACDF within the cervical spine. IMPRESSION: No active cardiopulmonary abnormalities. Electronically Signed   By: TKerby MoorsM.D.   On: 07/05/2022 11:12    Physical Exam Constitutional:      Appearance: Normal appearance. He is normal weight.  HENT:     Head: Normocephalic.     Nose: Nose normal.  Mouth/Throat:     Mouth: Mucous membranes are moist.     Pharynx: Oropharynx is clear.  Eyes:     Extraocular Movements: Extraocular movements intact.     Pupils: Pupils are equal, round, and reactive to light.  Neck:     Comments: In collar Pulmonary:     Effort: Pulmonary effort is normal.  Abdominal:     General: Abdomen is flat.  Musculoskeletal:        General: Normal range of motion.  Skin:    General: Skin is warm and dry.  Neurological:     General: No focal deficit present.     Mental Status: He is alert and oriented to person, place, and time. Mental status is at baseline.     Motor: No weakness.     Coordination: Coordination normal.     Gait: Gait normal.  Psychiatric:        Mood and Affect: Mood normal.        Behavior:  Behavior normal.        Thought Content: Thought content normal.        Judgment: Judgment normal.   OR for posterior arthrodesis C4-5 Risks including bleeding, infection, need for further surgery, fusion failure, hardware failure, and non union, along with other risks. He is in agreement and wishes to proceed

## 2022-08-18 NOTE — Anesthesia Preprocedure Evaluation (Addendum)
Anesthesia Evaluation  Patient identified by MRN, date of birth, ID band Patient awake    Reviewed: Allergy & Precautions, NPO status , Patient's Chart, lab work & pertinent test results  Airway Mallampati: IV   Neck ROM: Limited  Mouth opening: Limited Mouth Opening  Dental  (+) Teeth Intact, Dental Advisory Given   Pulmonary asthma , former smoker,    breath sounds clear to auscultation       Cardiovascular hypertension, Pt. on home beta blockers + Peripheral Vascular Disease   Rhythm:Regular Rate:Normal     Neuro/Psych PSYCHIATRIC DISORDERS Anxiety negative neurological ROS     GI/Hepatic Neg liver ROS, GERD  Medicated,  Endo/Other  negative endocrine ROS  Renal/GU negative Renal ROS     Musculoskeletal negative musculoskeletal ROS (+)   Abdominal Normal abdominal exam  (+)   Peds  Hematology negative hematology ROS (+)   Anesthesia Other Findings   Reproductive/Obstetrics                            Anesthesia Physical Anesthesia Plan  ASA: 2  Anesthesia Plan: General   Post-op Pain Management:    Induction: Intravenous  PONV Risk Score and Plan: 3 and Ondansetron, Dexamethasone and Treatment may vary due to age or medical condition  Airway Management Planned: Oral ETT and Video Laryngoscope Planned  Additional Equipment: None  Intra-op Plan:   Post-operative Plan: Extubation in OR  Informed Consent: I have reviewed the patients History and Physical, chart, labs and discussed the procedure including the risks, benefits and alternatives for the proposed anesthesia with the patient or authorized representative who has indicated his/her understanding and acceptance.     Dental advisory given  Plan Discussed with: CRNA  Anesthesia Plan Comments:        Anesthesia Quick Evaluation

## 2022-08-19 ENCOUNTER — Encounter (HOSPITAL_COMMUNITY): Payer: Self-pay | Admitting: Neurosurgery

## 2022-08-19 DIAGNOSIS — F419 Anxiety disorder, unspecified: Secondary | ICD-10-CM | POA: Diagnosis present

## 2022-08-19 DIAGNOSIS — W19XXXA Unspecified fall, initial encounter: Secondary | ICD-10-CM | POA: Diagnosis present

## 2022-08-19 DIAGNOSIS — Z79899 Other long term (current) drug therapy: Secondary | ICD-10-CM | POA: Diagnosis not present

## 2022-08-19 DIAGNOSIS — Z8261 Family history of arthritis: Secondary | ICD-10-CM | POA: Diagnosis not present

## 2022-08-19 DIAGNOSIS — Z823 Family history of stroke: Secondary | ICD-10-CM | POA: Diagnosis not present

## 2022-08-19 DIAGNOSIS — I10 Essential (primary) hypertension: Secondary | ICD-10-CM | POA: Diagnosis present

## 2022-08-19 DIAGNOSIS — K219 Gastro-esophageal reflux disease without esophagitis: Secondary | ICD-10-CM | POA: Diagnosis present

## 2022-08-19 DIAGNOSIS — I739 Peripheral vascular disease, unspecified: Secondary | ICD-10-CM | POA: Diagnosis present

## 2022-08-19 DIAGNOSIS — Z888 Allergy status to other drugs, medicaments and biological substances status: Secondary | ICD-10-CM | POA: Diagnosis not present

## 2022-08-19 DIAGNOSIS — S12300A Unspecified displaced fracture of fourth cervical vertebra, initial encounter for closed fracture: Secondary | ICD-10-CM | POA: Diagnosis present

## 2022-08-19 DIAGNOSIS — Z7982 Long term (current) use of aspirin: Secondary | ICD-10-CM | POA: Diagnosis not present

## 2022-08-19 DIAGNOSIS — Z87891 Personal history of nicotine dependence: Secondary | ICD-10-CM | POA: Diagnosis not present

## 2022-08-19 DIAGNOSIS — Z880 Allergy status to penicillin: Secondary | ICD-10-CM | POA: Diagnosis not present

## 2022-08-19 MED ORDER — OXYCODONE HCL 5 MG PO TABS: 5.0000 mg | ORAL_TABLET | Freq: Four times a day (QID) | ORAL | 0 refills | Status: AC | PRN

## 2022-08-19 NOTE — Evaluation (Signed)
Occupational Therapy Evaluation Patient Details Name: Francisco [name] MRN: [id number] DOB: 08-16-1941 Today's Date: 08/19/2022   History of Present Illness 81 yo M s/p posterior arthrodesis C4-5.  PMH includes: HTN, anxiety, vertigo.   Clinical Impression   Patient admitted for the procedure above.  PTA he was living alone, but after his fall, his two daughter's were rotating days and staying with him.  They were providing assist as needed.  He was receiving HH PT and would like to continue this.  Currently he is needing generalized supervision and verbal cues for ADL completion and toileting/ in room mobility.  Patient with good understanding of precautions, just needing gentle reminders.  All questions answered, and no further OT needs in the acute setting.  Recommend follow up with MD as prescribed.        Recommendations for follow up therapy are one component of a multi-disciplinary discharge planning process, led by the attending physician.  Recommendations may be updated based on patient status, additional functional criteria and insurance authorization.   Follow Up Recommendations  No OT follow up    Assistance Recommended at Discharge Intermittent Supervision/Assistance  Patient can return home with the following Assist for transportation;Assistance with cooking/housework;Direct supervision/assist for medications management    Functional Status Assessment  Patient has had a recent decline in their functional status and demonstrates the ability to make significant improvements in function in a reasonable and predictable amount of time.  Equipment Recommendations  None recommended by OT    Recommendations for Other Services       Precautions / Restrictions Precautions Precautions: Cervical Precaution Booklet Issued: Yes (comment) Required Braces or Orthoses: Cervical Brace Cervical Brace: Soft collar;For comfort Restrictions Weight Bearing Restrictions: No      Mobility  Bed Mobility Overal bed mobility: Needs Assistance Bed Mobility: Sit to Sidelying, Sidelying to Sit   Sidelying to sit: Supervision     Sit to sidelying: Supervision General bed mobility comments: VC's as needed    Transfers Overall transfer level: Needs assistance   Transfers: Sit to/from Stand Sit to Stand: Supervision                  Balance Overall balance assessment: Needs assistance Sitting-balance support: Feet supported Sitting balance-Leahy Scale: Good     Standing balance support: Reliant on assistive device for balance Standing balance-Leahy Scale: Fair                             ADL either performed or assessed with clinical judgement   ADL                                         General ADL Comments: generalized supervision     Vision Baseline Vision/History: 1 Wears glasses Patient Visual Report: No change from baseline       Perception Perception Perception: Within Functional Limits   Praxis Praxis Praxis: Intact    Pertinent Vitals/Pain Pain Assessment Pain Assessment: Faces Faces Pain Scale: Hurts a little bit Pain Location: neck Pain Descriptors / Indicators: Tender Pain Intervention(s): Monitored during session     Hand Dominance Right   Extremity/Trunk Assessment Upper Extremity Assessment Upper Extremity Assessment: Overall WFL for tasks assessed;LUE deficits/detail LUE Deficits / Details: missing pointer finger LUE Sensation: WNL LUE Coordination: WNL   Lower Extremity Assessment Lower Extremity Assessment: Defer  to PT evaluation   Cervical / Trunk Assessment Cervical / Trunk Assessment: Neck Surgery   Communication Communication Communication: No difficulties   Cognition Arousal/Alertness: Awake/alert Behavior During Therapy: WFL for tasks assessed/performed Overall Cognitive Status: Within Functional Limits for tasks assessed                                 General  Comments: STM deficts     General Comments   VSS on RA    Exercises     Shoulder Instructions      Home Living Family/patient expects to be discharged to:: Private residence Living Arrangements: Children Available Help at Discharge: Family;Available 24 hours/day Type of Home: House Home Access: Ramped entrance     Home Layout: One level     Bathroom Shower/Tub: Teacher, early years/pre: Handicapped height Bathroom Accessibility: Yes How Accessible: Accessible via walker Home Equipment: Garvin (2 wheels);Cane - single point;Shower seat;BSC/3in1;Wheelchair - manual;Grab bars - tub/shower   Additional Comments: Pt states youngest dtr will be staying with him and older dtr is available near by      Prior Functioning/Environment Prior Level of Function : Independent/Modified Independent;Driving             Mobility Comments: Recently using RW after fall ADLs Comments: daughter's were assisting with ADL and iADL after fall        OT Problem List: Impaired balance (sitting and/or standing)      OT Treatment/Interventions:      OT Goals(Current goals can be found in the care plan section) Acute Rehab OT Goals Patient Stated Goal: Return home OT Goal Formulation: With patient Time For Goal Achievement: 08/24/22 Potential to Achieve Goals: Good  OT Frequency:      Co-evaluation              AM-PAC OT "6 Clicks" Daily Activity     Outcome Measure Help from another person eating meals?: None Help from another person taking care of personal grooming?: None Help from another person toileting, which includes using toliet, bedpan, or urinal?: A Little Help from another person bathing (including washing, rinsing, drying)?: A Little Help from another person to put on and taking off regular upper body clothing?: None Help from another person to put on and taking off regular lower body clothing?: A Little 6 Click Score: 21   End of Session  Equipment Utilized During Treatment: Gait belt;Rolling walker (2 wheels) Nurse Communication: Mobility status  Activity Tolerance: Patient tolerated treatment well Patient left: in bed;with call bell/phone within reach  OT Visit Diagnosis: Unsteadiness on feet (R26.81)                Time: 2992-4268 OT Time Calculation (min): 20 min Charges:  OT General Charges $OT Visit: 1 Visit OT Evaluation $OT Eval Moderate Complexity: 1 Mod  08/19/2022  RP, OTR/L  Acute Rehabilitation Services  Office:  [phone]   Metta Clines 08/19/2022, 9:31 AM

## 2022-08-19 NOTE — Progress Notes (Signed)
Orthopedic Tech Progress Note Patient Details:  Francisco [name] 04/15/1941 [id number]  PT stopped me while I was going to service another patient with a soft collar, stated patient needed one, so I gave PT that one and went back to get another one for the original patient   Ortho Devices Type of Ortho Device: Soft collar Ortho Device/Splint Location: NECK Ortho Device/Splint Interventions: Ordered   Post Interventions Patient Tolerated: Well Instructions Provided: [facility] 08/19/2022, 9:16 AM

## 2022-08-19 NOTE — Anesthesia Postprocedure Evaluation (Signed)
Anesthesia Post Note  Patient: Filbert L Smeltz  Procedure(s) Performed: C4-5 Posterior cervical fusion with lateral mass fixation (Spine Cervical)     Patient location during evaluation: PACU Anesthesia Type: General Level of consciousness: awake and alert Pain management: pain level controlled Vital Signs Assessment: post-procedure vital signs reviewed and stable Respiratory status: spontaneous breathing, nonlabored ventilation, respiratory function stable and patient connected to nasal cannula oxygen Cardiovascular status: blood pressure returned to baseline and stable Postop Assessment: no apparent nausea or vomiting Anesthetic complications: yes   Encounter Notable Events  Notable Event Outcome Phase Comment  Difficult to intubate - expected  Intraprocedure Filed from anesthesia note documentation.    Last Vitals:  Vitals:   08/19/22 0752 08/19/22 1620  BP: (!) 124/55 (!) 155/73  Pulse: (!) 57 61  Resp: 18 18  Temp: 36.9 C 37.1 C  SpO2: 99% 99%    Last Pain:  Vitals:   08/19/22 1620  TempSrc: Oral  PainSc:                  Effie Berkshire

## 2022-08-19 NOTE — TOC Transition Note (Signed)
Transition of Care [facility]) - CM/SW Discharge Note   Patient Details  Name: Francisco [name] MRN: [id number] Date of Birth: Mar 11, 1941  [facility] CM/SW Contact:  Pollie Friar, RN Phone Number: 08/19/2022, 10:37 AM   Clinical Narrative:    Pt is already active with [facility] for home health services. CM has updated Tommi Rumps with [facility] of his admission and d/c resumption orders.  Pt has transport home when discharged.    Final next level of care: [place] Barriers to Discharge: No Barriers Identified   Patient Goals and CMS Choice        Discharge Placement                       Discharge Plan and Services                                     Social Determinants of Health (SDOH) Interventions     Readmission Risk Interventions     No data to display

## 2022-08-19 NOTE — Discharge Instructions (Addendum)
Wound Care Remove dressing in 3 days Leave incision open to air. You may shower. Do not scrub directly on incision.  Do not put any creams, lotions, or ointments on incision. Activity Walk each and every day, increasing distance each day. No lifting greater than 5 lbs.  Avoid bending, arching, and twisting. No driving for 2 weeks; may ride as a passenger locally. Wear neck brace for comfort Diet Resume your normal diet.   Call Your Doctor If Any of These Occur Redness, drainage, or swelling at the wound.  Temperature greater than 101 degrees. Severe pain not relieved by pain medication. Incision starts to come apart. Follow Up Appt Call today for appointment in 3 weeks ([phone]) or for problems.  If you have any hardware placed in your spine, you will need an x-ray before your appointment.

## 2022-08-19 NOTE — Progress Notes (Signed)
Pt. discharged home accompanied by husband. Prescriptions and discharge instructions given with verbalization of understanding. Incision site on back with no s/s of infection - no swelling, redness, bleeding, and/or drainage noted. Opportunity given to ask questions but no question asked. Pt. transported out of this unit in wheelchair by the unit staff.

## 2022-08-19 NOTE — Discharge Summary (Signed)
Physician Discharge Summary  Patient ID: Francisco [name] MRN: [id number] DOB/AGE: Mar 16, 1941 81 y.o.  Admit date: 08/18/2022 Discharge date: 08/19/2022  Admission Diagnoses:Cervical four fracture  Discharge Diagnoses: same Principal Problem:   C4 cervical fracture [name]   Discharged Condition: good  Hospital Course: Francisco [name] was admitted and taken to the operating room for a posterior cervical arthrodesis due to a ruptured anterior longitudinal ligament, and a cervical four fracture. Post op he is moving all extremities, tolerating a regular diet, and voiding. Wound is clean, dry, without signs of infection   Treatments: surgery: posterior spinal fusion C4-5 medtronic instrumentation  Discharge Exam: Blood pressure (!) 124/55, pulse (!) 57, temperature 98.5 F (36.9 C), resp. rate 18, height '5\' 9"'$  (1.753 m), weight 81.6 kg, SpO2 99 %. General appearance: alert, cooperative, appears stated age, and mild distress  Disposition: Discharge disposition: 01-Home or Self Care      Other closed displaced fracture of fourth cervical vertebra  Allergies as of 08/19/2022       Reactions   Crestor [rosuvastatin] Other (See Comments)   Unknown per Pt   Lipitor [atorvastatin] Other (See Comments)   Unknown per Pt   Penicillins    REACTION: swelling/hives Has patient had a PCN reaction causing immediate rash, facial/tongue/throat swelling, SOB or lightheadedness with hypotension:yes Has patient had a PCN reaction causing severe rash involving mucus membranes or skin necrosis: Yes Has patient had a PCN reaction that required hospitalization No Has patient had a PCN reaction occurring within the last 10 years: No If all of the above answers are "NO", then may proceed with Cephalosporin use.   Symbicort [budesonide-formoterol Fumarate] Other (See Comments)   Pain Behind ribs Unknown per Pt        Medication List     TAKE these medications    acetaminophen 325 MG tablet Commonly  known as: TYLENOL Take 650 mg by mouth every 6 (six) hours as needed for mild pain or fever.   ALPRAZolam 1 MG tablet Commonly known as: XANAX Take 1 tablet (1 mg total) by mouth 2 (two) times daily as needed for anxiety. TAKE 1 TABLET BY MOUTH TWICE  DAILY AS NEEDED FOR ANXIETY OR  SLEEP   aspirin 81 MG tablet Take 81 mg by mouth daily.   docusate sodium 100 MG capsule Commonly known as: COLACE Take 1 capsule (100 mg total) by mouth 2 (two) times daily. What changed:  when to take this reasons to take this   escitalopram 10 MG tablet Commonly known as: LEXAPRO Take 2 tablets (20 mg total) by mouth daily.   fish oil-omega-3 fatty acids 1000 MG capsule Take 2 g by mouth daily.   furosemide 20 MG tablet Commonly known as: LASIX Take 1 tablet (20 mg total) by mouth daily.   meclizine 25 MG tablet Commonly known as: ANTIVERT TAKE 1 TABLET BY MOUTH 3  TIMES DAILY AS NEEDED FOR  DIZZINESS What changed: See the new instructions.   metoprolol succinate 25 MG 24 hr tablet Commonly known as: TOPROL-XL TAKE 1 TABLET BY MOUTH TWICE  DAILY   oxyCODONE 5 MG immediate release tablet Commonly known as: Oxy IR/ROXICODONE Take 1 tablet (5 mg total) by mouth every 6 (six) hours as needed for moderate pain or severe pain. What changed: Another medication with the same name was added. Make sure you understand how and when to take each.   oxyCODONE 5 MG immediate release tablet Commonly known as: Roxicodone Take 1 tablet (5 mg total)  by mouth every 6 (six) hours as needed for up to 8 days for severe pain. What changed: You were already taking a medication with the same name, and this prescription was added. Make sure you understand how and when to take each.   pantoprazole 20 MG tablet Commonly known as: PROTONIX Take 1 tablet (20 mg total) by mouth daily.   polyethylene glycol 17 g packet Commonly known as: MIRALAX / GLYCOLAX Take 17 g by mouth 2 (two) times daily. What changed:   when to take this reasons to take this   simvastatin 80 MG tablet Commonly known as: Zocor Take 1 tablet (80 mg total) by mouth daily.   tamsulosin 0.4 MG Caps capsule Commonly known as: FLOMAX Take 1 capsule (0.4 mg total) by mouth daily.        Follow-up Information     [facility] Follow up.   Specialty: Home Health Services Why: The home health agency will contact you for the next home visit. Contact information: [place] [address] [phone]         Ashok Pall, MD Follow up.   Specialty: Neurosurgery Why: keep your scheduled appointmment Contact information: [address] [place] [address] [phone]                 Signed: Ashok Pall 08/19/2022, 3:24 PM

## 2022-08-19 NOTE — Evaluation (Signed)
Physical Therapy Evaluation  Patient Details Name: Francisco [name] MRN: [id number] DOB: 1941/05/07 Today's Date: 08/19/2022  History of Present Illness  81 yo M s/p posterior arthrodesis C4-5 on 08/18/2022.  PMH includes: HTN, anxiety, vertigo.  Clinical Impression  Pt admitted with above diagnosis. At the time of PT eval, pt was able to demonstrate transfers and ambulation with gross min guard assist and RW for support. Pt was educated on precautions, brace application/wearing schedule, appropriate activity progression, and car transfer. Pt currently with functional limitations due to the deficits listed below (see PT Problem List). Pt will benefit from skilled PT to increase their independence and safety with mobility to allow discharge to the venue listed below.         Recommendations for follow up therapy are one component of a multi-disciplinary discharge planning process, led by the attending physician.  Recommendations may be updated based on patient status, additional functional criteria and insurance authorization.  Follow Up Recommendations Home health PT      Assistance Recommended at Discharge PRN  Patient can return home with the following  A little help with walking and/or transfers;A little help with bathing/dressing/bathroom;Assistance with cooking/housework;Assist for transportation;Help with stairs or ramp for entrance    Equipment Recommendations Rolling walker (2 wheels)  Recommendations for Other Services       Functional Status Assessment Patient has had a recent decline in their functional status and demonstrates the ability to make significant improvements in function in a reasonable and predictable amount of time.     Precautions / Restrictions Precautions Precautions: Cervical Precaution Booklet Issued: Yes (comment) Precaution Comments: Reviewed handout and pt was cued for precautions during functional mobility. Required Braces or Orthoses: Cervical  Brace Cervical Brace: Soft collar;For comfort Restrictions Weight Bearing Restrictions: No      Mobility  Bed Mobility Overal bed mobility: Needs Assistance Bed Mobility: Supine to Sit           General bed mobility comments: Pt sitting up in the bed with HOB elevated. Pt was able to swing LE's around to EOB without assist. Supervision for safety and cues for optimal maintenance of precautions. Pt plans to sleep in the recliner at home.    Transfers Overall transfer level: Needs assistance Equipment used: Rolling walker (2 wheels) Transfers: Sit to/from Stand Sit to Stand: Supervision           General transfer comment: Light supervision for power up to full stand. VC's for hand placement on seated surface for safety.    Ambulation/Gait Ambulation/Gait assistance: Min guard Gait Distance (Feet): 500 Feet Assistive device: Rolling walker (2 wheels) Gait Pattern/deviations: Step-through pattern, Decreased stride length, Trunk flexed Gait velocity: Decreased Gait velocity interpretation: 1.31 - 2.62 ft/sec, indicative of limited community ambulator   General Gait Details: VC's for improved posture, closer walker proximity, and forward gaze.  Stairs            Wheelchair Mobility    Modified Rankin (Stroke Patients Only)       Balance Overall balance assessment: Needs assistance Sitting-balance support: Feet supported Sitting balance-Leahy Scale: Good     Standing balance support: Reliant on assistive device for balance Standing balance-Leahy Scale: Fair                               Pertinent Vitals/Pain Pain Assessment Pain Assessment: Faces Faces Pain Scale: Hurts a little bit Pain Location: neck Pain Descriptors / Indicators:  Tender Pain Intervention(s): Limited activity within patient's tolerance, Monitored during session, Repositioned    Home Living Family/patient expects to be discharged to:: Private residence Living  Arrangements: Children Available Help at Discharge: Family;Available 24 hours/day Type of Home: House Home Access: Ramped entrance       Home Layout: One level Home Equipment: Conservation officer, nature (2 wheels);Cane - single point;Shower seat;BSC/3in1;Wheelchair - manual;Grab bars - tub/shower Additional Comments: Pt states youngest dtr will be staying with him and older dtr is available near by    Prior Function Prior Level of Function : Independent/Modified Independent;Driving             Mobility Comments: Recently using RW after fall ADLs Comments: daughter's were assisting with ADL and iADL after fall     Hand Dominance   Dominant Hand: Right    Extremity/Trunk Assessment   Upper Extremity Assessment Upper Extremity Assessment: Defer to OT evaluation LUE Deficits / Details: missing pointer finger LUE Sensation: WNL LUE Coordination: WNL    Lower Extremity Assessment Lower Extremity Assessment: Generalized weakness (Mild; likely baseline)    Cervical / Trunk Assessment Cervical / Trunk Assessment: Neck Surgery  Communication   Communication: No difficulties  Cognition Arousal/Alertness: Awake/alert Behavior During Therapy: WFL for tasks assessed/performed Overall Cognitive Status: Within Functional Limits for tasks assessed                                          General Comments      Exercises     Assessment/Plan    PT Assessment Patient needs continued PT services  PT Problem List Decreased strength;Decreased activity tolerance;Decreased balance;Decreased mobility;Decreased knowledge of use of DME;Decreased safety awareness;Decreased knowledge of precautions;Pain       PT Treatment Interventions DME instruction;Gait training;Functional mobility training;Therapeutic activities;Therapeutic exercise;Patient/family education;Balance training    PT Goals (Current goals can be found in the Care Plan section)  Acute Rehab PT Goals Patient  Stated Goal: Home today PT Goal Formulation: With patient Time For Goal Achievement: 08/26/22 Potential to Achieve Goals: Good    Frequency Min 5X/week     Co-evaluation               AM-PAC PT "6 Clicks" Mobility  Outcome Measure Help needed turning from your back to your side while in a flat bed without using bedrails?: None Help needed moving from lying on your back to sitting on the side of a flat bed without using bedrails?: A Little Help needed moving to and from a bed to a chair (including a wheelchair)?: A Little Help needed standing up from a chair using your arms (e.g., wheelchair or bedside chair)?: A Little Help needed to walk in hospital room?: A Little Help needed climbing 3-5 steps with a railing? : A Little 6 Click Score: 19    End of Session Equipment Utilized During Treatment: Gait belt;Cervical collar Activity Tolerance: Patient tolerated treatment well Patient left: in chair;with call bell/phone within reach Nurse Communication: Mobility status PT Visit Diagnosis: Unsteadiness on feet (R26.81);Pain Pain - part of body:  (back)    Time: 0928-1000 PT Time Calculation (min) (ACUTE ONLY): 32 min   Charges:   PT Evaluation $PT Eval Low Complexity: 1 Low PT Treatments $Gait Training: 8-22 mins        Rolinda Roan, PT, DPT Acute Rehabilitation Services Secure Chat Preferred Office: [phone]   Thelma Comp 08/19/2022, 10:23  AM

## 2022-08-20 ENCOUNTER — Telehealth: Payer: Self-pay | Admitting: *Deleted

## 2022-08-20 ENCOUNTER — Encounter: Payer: Self-pay | Admitting: *Deleted

## 2022-08-20 NOTE — Patient Outreach (Signed)
  Care Coordination [facility] Note Transition Care Management Follow-up Telephone Call Date of discharge and from where: [facility] 08/19/22 How have you been since you were released from the hospital? "He's doing good. Slept good last night. He is having a little bit of pain but controlling it with Tylenol. He's using his wheelchair for now" Any questions or concerns? No  Items Reviewed: Did the pt receive and understand the discharge instructions provided? Yes  Medications obtained and verified? Yes  Other? Yes . Prescribed oxycodone for pain management but he has taken that in the recent past and had hallucinations so he prefers to not take that. He is taking Tylenol now and it is controlling his pain. Advised to reach out to Neurosurgeon if the tylenol is not adequately managing his pain.  Any new allergies since your discharge? No  Dietary orders reviewed? Yes Do you have support at home? Yes daughter and son-in-law  [place] and Equipment/Supplies: Were home health services ordered? yes If so, what is the name of the agency? [facility]  Has the agency set up a time to come to the patient's home? No. Patient/daughter has their contact number and will reach out if they haven't called before tomorrow. He was having PT through [facility] before his fall and would like to work with the physical therapist, Simona Huh again. Were any new equipment or medical supplies ordered?  Yes: walker What is the name of the medical supply agency? unknown Were you able to get the supplies/equipment? Yes. Given to him at the hospital. Also has a wheelchair at home. Do you have any questions related to the use of the equipment or supplies? No  Functional Questionnaire: (I = Independent and D = Dependent) ADLs: D  Bathing/Dressing- D but assists  Meal Prep- D  Eating- I  Maintaining continence- I  Transferring/Ambulation- I with wheelchair. Too weak for walker right now.  Managing Meds- I  Follow up appointments  reviewed:  PCP Hospital f/u appt confirmed?  Need not indicated in discharge notes   [facility] f/u appt confirmed? Yes  Scheduled to see Dr Christella Noa (neurosurgeon) on 09/08/22 @ 11:30. Are transportation arrangements needed? No  If their condition worsens, is the pt aware to call PCP or go to the Emergency Dept.? Yes Was the patient provided with contact information for the PCP's office or ED? Yes Was to pt encouraged to call back with questions or concerns? Yes  SDOH assessments and interventions completed:   Yes  Care Coordination Interventions Activated:  No   Care Coordination Interventions:  No Care Coordination interventions needed at this time.   Encounter Outcome:  Pt. Visit Completed    Chong Sicilian, BSN, RN-BC RN Care Coordinator [facility] Direct Dial: [phone] Main #: [phone]

## 2022-08-24 DIAGNOSIS — K219 Gastro-esophageal reflux disease without esophagitis: Secondary | ICD-10-CM | POA: Diagnosis not present

## 2022-08-24 DIAGNOSIS — Z48815 Encounter for surgical aftercare following surgery on the digestive system: Secondary | ICD-10-CM | POA: Diagnosis not present

## 2022-08-24 DIAGNOSIS — I129 Hypertensive chronic kidney disease with stage 1 through stage 4 chronic kidney disease, or unspecified chronic kidney disease: Secondary | ICD-10-CM | POA: Diagnosis not present

## 2022-08-24 DIAGNOSIS — N189 Chronic kidney disease, unspecified: Secondary | ICD-10-CM | POA: Diagnosis not present

## 2022-08-26 NOTE — Op Note (Signed)
08/18/2022  3:49 PM  PATIENT:  Francisco [name]  81 y.o. 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  BLOOD ADMINISTERED:none  CELL SAVER GIVEN:none  COUNT:per nursing  DRAINS: none   SPECIMEN:  No Specimen  DICTATION: Francisco [name] was taken to the operating room, intubated, and placed under a general anesthetic without difficulty. His head was placed in a [place] head holder once adequate anestheisia was obtained. He was turned prone onto the operating room table with his head secured to the bed with the [place] head holder attachment. He  was positioned in a military chin tuck position. His neck was prepped and draped in a sterile manner.  I opened his neck with a 10 blade, and carried the incision down through the subcutaneous soft tissue. I used monopolar cautery and exposed the spinous processes and lateral masses of C4 and C5 on the left and right. Once confirming my location I used the drill and placed screws into the lateral masses of C4 and C5. I placed rods and locking caps into the screw heads. I verified the placement with fluoroscopy.   C4 and C5 were arthrodesed via decortication of the C4 and 5 lamina and allograft morsels placed on the bone. BMP I closed the incision with vicryl sutures, and placed a sterile dressing. He was rolled supine onto the stretcher. I removed the head holder. He was extubated in the room.  PLAN OF CARE: Admit to inpatient   PATIENT DISPOSITION:  PACU - hemodynamically stable.   Delay start of Pharmacological VTE agent (>24hrs) due to surgical blood loss or risk of  bleeding:  yes

## 2022-08-28 DIAGNOSIS — I129 Hypertensive chronic kidney disease with stage 1 through stage 4 chronic kidney disease, or unspecified chronic kidney disease: Secondary | ICD-10-CM | POA: Diagnosis not present

## 2022-08-28 DIAGNOSIS — K219 Gastro-esophageal reflux disease without esophagitis: Secondary | ICD-10-CM | POA: Diagnosis not present

## 2022-08-28 DIAGNOSIS — N189 Chronic kidney disease, unspecified: Secondary | ICD-10-CM | POA: Diagnosis not present

## 2022-08-28 DIAGNOSIS — Z48815 Encounter for surgical aftercare following surgery on the digestive system: Secondary | ICD-10-CM | POA: Diagnosis not present

## 2022-08-31 DIAGNOSIS — K219 Gastro-esophageal reflux disease without esophagitis: Secondary | ICD-10-CM | POA: Diagnosis not present

## 2022-08-31 DIAGNOSIS — Z48815 Encounter for surgical aftercare following surgery on the digestive system: Secondary | ICD-10-CM | POA: Diagnosis not present

## 2022-08-31 DIAGNOSIS — I129 Hypertensive chronic kidney disease with stage 1 through stage 4 chronic kidney disease, or unspecified chronic kidney disease: Secondary | ICD-10-CM | POA: Diagnosis not present

## 2022-08-31 DIAGNOSIS — N189 Chronic kidney disease, unspecified: Secondary | ICD-10-CM | POA: Diagnosis not present

## 2022-09-01 ENCOUNTER — Other Ambulatory Visit: Payer: Self-pay | Admitting: Nurse Practitioner

## 2022-09-03 DIAGNOSIS — N189 Chronic kidney disease, unspecified: Secondary | ICD-10-CM | POA: Diagnosis not present

## 2022-09-03 DIAGNOSIS — I129 Hypertensive chronic kidney disease with stage 1 through stage 4 chronic kidney disease, or unspecified chronic kidney disease: Secondary | ICD-10-CM | POA: Diagnosis not present

## 2022-09-03 DIAGNOSIS — Z48815 Encounter for surgical aftercare following surgery on the digestive system: Secondary | ICD-10-CM | POA: Diagnosis not present

## 2022-09-03 DIAGNOSIS — K219 Gastro-esophageal reflux disease without esophagitis: Secondary | ICD-10-CM | POA: Diagnosis not present

## 2022-09-08 DIAGNOSIS — S12390A Other displaced fracture of fourth cervical vertebra, initial encounter for closed fracture: Secondary | ICD-10-CM | POA: Diagnosis not present

## 2022-09-09 DIAGNOSIS — N189 Chronic kidney disease, unspecified: Secondary | ICD-10-CM | POA: Diagnosis not present

## 2022-09-09 DIAGNOSIS — Z48815 Encounter for surgical aftercare following surgery on the digestive system: Secondary | ICD-10-CM | POA: Diagnosis not present

## 2022-09-09 DIAGNOSIS — K219 Gastro-esophageal reflux disease without esophagitis: Secondary | ICD-10-CM | POA: Diagnosis not present

## 2022-09-09 DIAGNOSIS — I129 Hypertensive chronic kidney disease with stage 1 through stage 4 chronic kidney disease, or unspecified chronic kidney disease: Secondary | ICD-10-CM | POA: Diagnosis not present

## 2022-09-16 DIAGNOSIS — I129 Hypertensive chronic kidney disease with stage 1 through stage 4 chronic kidney disease, or unspecified chronic kidney disease: Secondary | ICD-10-CM | POA: Diagnosis not present

## 2022-09-16 DIAGNOSIS — N189 Chronic kidney disease, unspecified: Secondary | ICD-10-CM | POA: Diagnosis not present

## 2022-09-16 DIAGNOSIS — Z48815 Encounter for surgical aftercare following surgery on the digestive system: Secondary | ICD-10-CM | POA: Diagnosis not present

## 2022-09-16 DIAGNOSIS — K219 Gastro-esophageal reflux disease without esophagitis: Secondary | ICD-10-CM | POA: Diagnosis not present

## 2022-09-21 ENCOUNTER — Other Ambulatory Visit: Payer: Self-pay | Admitting: Nurse Practitioner

## 2022-09-21 DIAGNOSIS — R42 Dizziness and giddiness: Secondary | ICD-10-CM

## 2022-09-22 ENCOUNTER — Other Ambulatory Visit: Payer: Self-pay | Admitting: Nurse Practitioner

## 2022-09-22 DIAGNOSIS — R42 Dizziness and giddiness: Secondary | ICD-10-CM

## 2022-09-22 MED ORDER — MECLIZINE HCL 25 MG PO TABS: 25.0000 mg | ORAL_TABLET | Freq: Two times a day (BID) | ORAL | 0 refills | Status: DC

## 2022-09-22 NOTE — Progress Notes (Signed)
Meclizine refilled.

## 2022-09-22 NOTE — Telephone Encounter (Signed)
Pts daughter called to make sure MMM received refill request for pts Meclizine. Says the pharmacy says he is out of refills and needs to be filled asap because pt takes for his dizziness.

## 2022-09-24 ENCOUNTER — Other Ambulatory Visit: Payer: Self-pay | Admitting: Nurse Practitioner

## 2022-09-24 DIAGNOSIS — K219 Gastro-esophageal reflux disease without esophagitis: Secondary | ICD-10-CM

## 2022-11-16 ENCOUNTER — Other Ambulatory Visit: Payer: Self-pay | Admitting: Nurse Practitioner

## 2022-11-16 DIAGNOSIS — F411 Generalized anxiety disorder: Secondary | ICD-10-CM

## 2022-12-02 ENCOUNTER — Ambulatory Visit: Payer: Medicare Other

## 2022-12-06 DIAGNOSIS — R300 Dysuria: Secondary | ICD-10-CM | POA: Diagnosis not present

## 2022-12-06 DIAGNOSIS — R509 Fever, unspecified: Secondary | ICD-10-CM | POA: Diagnosis not present

## 2022-12-06 DIAGNOSIS — R112 Nausea with vomiting, unspecified: Secondary | ICD-10-CM | POA: Diagnosis not present

## 2022-12-07 ENCOUNTER — Inpatient Hospital Stay (HOSPITAL_COMMUNITY)
Admission: EM | Admit: 2022-12-07 | Discharge: 2022-12-14 | DRG: 871 | Disposition: A | Discharge status: Skilled Nursing Facility | Payer: Medicare Other | Attending: Internal Medicine | Admitting: Internal Medicine

## 2022-12-07 ENCOUNTER — Encounter (HOSPITAL_COMMUNITY): Payer: Self-pay | Admitting: Internal Medicine

## 2022-12-07 ENCOUNTER — Other Ambulatory Visit: Payer: Self-pay

## 2022-12-07 ENCOUNTER — Inpatient Hospital Stay (HOSPITAL_COMMUNITY): Payer: Medicare Other

## 2022-12-07 ENCOUNTER — Emergency Department (HOSPITAL_COMMUNITY): Payer: Medicare Other

## 2022-12-07 DIAGNOSIS — R7401 Elevation of levels of liver transaminase levels: Secondary | ICD-10-CM | POA: Diagnosis present

## 2022-12-07 DIAGNOSIS — K224 Dyskinesia of esophagus: Secondary | ICD-10-CM | POA: Diagnosis not present

## 2022-12-07 DIAGNOSIS — R1312 Dysphagia, oropharyngeal phase: Secondary | ICD-10-CM | POA: Diagnosis not present

## 2022-12-07 DIAGNOSIS — Z79899 Other long term (current) drug therapy: Secondary | ICD-10-CM

## 2022-12-07 DIAGNOSIS — Z981 Arthrodesis status: Secondary | ICD-10-CM

## 2022-12-07 DIAGNOSIS — Z1152 Encounter for screening for COVID-19: Secondary | ICD-10-CM | POA: Diagnosis not present

## 2022-12-07 DIAGNOSIS — A4159 Other Gram-negative sepsis: Principal | ICD-10-CM | POA: Diagnosis present

## 2022-12-07 DIAGNOSIS — F411 Generalized anxiety disorder: Secondary | ICD-10-CM | POA: Diagnosis present

## 2022-12-07 DIAGNOSIS — N390 Urinary tract infection, site not specified: Secondary | ICD-10-CM | POA: Diagnosis not present

## 2022-12-07 DIAGNOSIS — Z6825 Body mass index (BMI) 25.0-25.9, adult: Secondary | ICD-10-CM

## 2022-12-07 DIAGNOSIS — N179 Acute kidney failure, unspecified: Secondary | ICD-10-CM | POA: Diagnosis not present

## 2022-12-07 DIAGNOSIS — Z9049 Acquired absence of other specified parts of digestive tract: Secondary | ICD-10-CM

## 2022-12-07 DIAGNOSIS — K439 Ventral hernia without obstruction or gangrene: Secondary | ICD-10-CM | POA: Diagnosis not present

## 2022-12-07 DIAGNOSIS — N1831 Chronic kidney disease, stage 3a: Secondary | ICD-10-CM | POA: Diagnosis not present

## 2022-12-07 DIAGNOSIS — K219 Gastro-esophageal reflux disease without esophagitis: Secondary | ICD-10-CM | POA: Diagnosis not present

## 2022-12-07 DIAGNOSIS — K449 Diaphragmatic hernia without obstruction or gangrene: Secondary | ICD-10-CM | POA: Diagnosis present

## 2022-12-07 DIAGNOSIS — Z87891 Personal history of nicotine dependence: Secondary | ICD-10-CM

## 2022-12-07 DIAGNOSIS — R6520 Severe sepsis without septic shock: Secondary | ICD-10-CM | POA: Diagnosis present

## 2022-12-07 DIAGNOSIS — F419 Anxiety disorder, unspecified: Secondary | ICD-10-CM | POA: Diagnosis not present

## 2022-12-07 DIAGNOSIS — K5909 Other constipation: Secondary | ICD-10-CM | POA: Diagnosis present

## 2022-12-07 DIAGNOSIS — R7309 Other abnormal glucose: Secondary | ICD-10-CM

## 2022-12-07 DIAGNOSIS — Z880 Allergy status to penicillin: Secondary | ICD-10-CM

## 2022-12-07 DIAGNOSIS — R7989 Other specified abnormal findings of blood chemistry: Secondary | ICD-10-CM | POA: Diagnosis present

## 2022-12-07 DIAGNOSIS — I129 Hypertensive chronic kidney disease with stage 1 through stage 4 chronic kidney disease, or unspecified chronic kidney disease: Secondary | ICD-10-CM | POA: Diagnosis not present

## 2022-12-07 DIAGNOSIS — R945 Abnormal results of liver function studies: Secondary | ICD-10-CM | POA: Diagnosis not present

## 2022-12-07 DIAGNOSIS — Z4659 Encounter for fitting and adjustment of other gastrointestinal appliance and device: Secondary | ICD-10-CM | POA: Diagnosis not present

## 2022-12-07 DIAGNOSIS — E43 Unspecified severe protein-calorie malnutrition: Secondary | ICD-10-CM | POA: Diagnosis not present

## 2022-12-07 DIAGNOSIS — R1310 Dysphagia, unspecified: Secondary | ICD-10-CM | POA: Diagnosis not present

## 2022-12-07 DIAGNOSIS — J150 Pneumonia due to Klebsiella pneumoniae: Secondary | ICD-10-CM | POA: Diagnosis present

## 2022-12-07 DIAGNOSIS — E860 Dehydration: Secondary | ICD-10-CM | POA: Diagnosis not present

## 2022-12-07 DIAGNOSIS — R17 Unspecified jaundice: Secondary | ICD-10-CM

## 2022-12-07 DIAGNOSIS — D631 Anemia in chronic kidney disease: Secondary | ICD-10-CM | POA: Diagnosis not present

## 2022-12-07 DIAGNOSIS — I10 Essential (primary) hypertension: Secondary | ICD-10-CM | POA: Diagnosis present

## 2022-12-07 DIAGNOSIS — Z89022 Acquired absence of left finger(s): Secondary | ICD-10-CM

## 2022-12-07 DIAGNOSIS — R509 Fever, unspecified: Secondary | ICD-10-CM | POA: Diagnosis not present

## 2022-12-07 DIAGNOSIS — D649 Anemia, unspecified: Secondary | ICD-10-CM | POA: Diagnosis not present

## 2022-12-07 DIAGNOSIS — F32A Depression, unspecified: Secondary | ICD-10-CM | POA: Diagnosis not present

## 2022-12-07 DIAGNOSIS — N189 Chronic kidney disease, unspecified: Secondary | ICD-10-CM | POA: Diagnosis not present

## 2022-12-07 DIAGNOSIS — N183 Chronic kidney disease, stage 3 unspecified: Secondary | ICD-10-CM | POA: Diagnosis not present

## 2022-12-07 DIAGNOSIS — I700 Atherosclerosis of aorta: Secondary | ICD-10-CM | POA: Diagnosis not present

## 2022-12-07 DIAGNOSIS — R627 Adult failure to thrive: Secondary | ICD-10-CM | POA: Diagnosis present

## 2022-12-07 DIAGNOSIS — R630 Anorexia: Secondary | ICD-10-CM | POA: Diagnosis not present

## 2022-12-07 DIAGNOSIS — Z7982 Long term (current) use of aspirin: Secondary | ICD-10-CM

## 2022-12-07 DIAGNOSIS — A4150 Gram-negative sepsis, unspecified: Secondary | ICD-10-CM | POA: Diagnosis not present

## 2022-12-07 DIAGNOSIS — N400 Enlarged prostate without lower urinary tract symptoms: Secondary | ICD-10-CM | POA: Diagnosis present

## 2022-12-07 DIAGNOSIS — Z66 Do not resuscitate: Secondary | ICD-10-CM | POA: Diagnosis not present

## 2022-12-07 DIAGNOSIS — K838 Other specified diseases of biliary tract: Secondary | ICD-10-CM | POA: Diagnosis not present

## 2022-12-07 DIAGNOSIS — R634 Abnormal weight loss: Secondary | ICD-10-CM | POA: Diagnosis not present

## 2022-12-07 DIAGNOSIS — E785 Hyperlipidemia, unspecified: Secondary | ICD-10-CM | POA: Diagnosis present

## 2022-12-07 DIAGNOSIS — R7881 Bacteremia: Secondary | ICD-10-CM | POA: Diagnosis not present

## 2022-12-07 DIAGNOSIS — J45909 Unspecified asthma, uncomplicated: Secondary | ICD-10-CM | POA: Diagnosis not present

## 2022-12-07 DIAGNOSIS — N182 Chronic kidney disease, stage 2 (mild): Secondary | ICD-10-CM | POA: Diagnosis not present

## 2022-12-07 DIAGNOSIS — Z888 Allergy status to other drugs, medicaments and biological substances status: Secondary | ICD-10-CM

## 2022-12-07 LAB — BLOOD CULTURE ID PANEL (REFLEXED) - BCID2

## 2022-12-07 LAB — COMPREHENSIVE METABOLIC PANEL
ALT: 130 U/L — ABNORMAL HIGH (ref 0–44)
AST: 90 U/L — ABNORMAL HIGH (ref 15–41)
Albumin: 3.2 g/dL — ABNORMAL LOW (ref 3.5–5.0)
Alkaline Phosphatase: 411 U/L — ABNORMAL HIGH (ref 38–126)
Anion gap: 12 (ref 5–15)
BUN: 20 mg/dL (ref 8–23)
CO2: 24 mmol/L (ref 22–32)
Calcium: 8.9 mg/dL (ref 8.9–10.3)
Chloride: 100 mmol/L (ref 98–111)
Creatinine, Ser: 1.88 mg/dL — ABNORMAL HIGH (ref 0.61–1.24)
GFR, Estimated: 35 mL/min — ABNORMAL LOW (ref >60)
Glucose, Bld: 218 mg/dL — ABNORMAL HIGH (ref 70–99)
Potassium: 4 mmol/L (ref 3.5–5.1)
Sodium: 136 mmol/L (ref 135–145)
Total Bilirubin: 3.9 mg/dL — ABNORMAL HIGH (ref 0.3–1.2)
Total Protein: 6.6 g/dL (ref 6.5–8.1)

## 2022-12-07 LAB — URINALYSIS, ROUTINE W REFLEX MICROSCOPIC
Glucose, UA: 100 mg/dL — AB
Hgb urine dipstick: NEGATIVE
Ketones, ur: 40 mg/dL — AB
Leukocytes,Ua: NEGATIVE
Nitrite: POSITIVE — AB
Protein, ur: 30 mg/dL — AB
Specific Gravity, Urine: 1.02 (ref 1.005–1.030)
pH: 5 (ref 5.0–8.0)

## 2022-12-07 LAB — CBC WITH DIFFERENTIAL/PLATELET
Abs Immature Granulocytes: 0.07 10*3/uL (ref 0.00–0.07)
Basophils Absolute: 0 10*3/uL (ref 0.0–0.1)
Basophils Relative: 0 %
Eosinophils Absolute: 0 10*3/uL (ref 0.0–0.5)
Eosinophils Relative: 0 %
HCT: 37.5 % — ABNORMAL LOW (ref 39.0–52.0)
Hemoglobin: 12.2 g/dL — ABNORMAL LOW (ref 13.0–17.0)
Immature Granulocytes: 0 %
Lymphocytes Relative: 2 %
Lymphs Abs: 0.3 10*3/uL — ABNORMAL LOW (ref 0.7–4.0)
MCH: 31.8 pg (ref 26.0–34.0)
MCHC: 32.5 g/dL (ref 30.0–36.0)
MCV: 97.7 fL (ref 80.0–100.0)
Monocytes Absolute: 0.6 10*3/uL (ref 0.1–1.0)
Monocytes Relative: 4 %
Neutro Abs: 14.8 10*3/uL — ABNORMAL HIGH (ref 1.7–7.7)
Neutrophils Relative %: 94 %
Platelets: 246 10*3/uL (ref 150–400)
RBC: 3.84 MIL/uL — ABNORMAL LOW (ref 4.22–5.81)
RDW: 13.5 % (ref 11.5–15.5)
WBC: 15.8 10*3/uL — ABNORMAL HIGH (ref 4.0–10.5)
nRBC: 0 % (ref 0.0–0.2)

## 2022-12-07 LAB — LACTIC ACID, PLASMA: Lactic Acid, Venous: 1.2 mmol/L (ref 0.5–1.9)

## 2022-12-07 LAB — RESP PANEL BY RT-PCR (RSV, FLU A&B, COVID)  RVPGX2
Influenza A by PCR: NEGATIVE
Influenza B by PCR: NEGATIVE
Resp Syncytial Virus by PCR: NEGATIVE
SARS Coronavirus 2 by RT PCR: NEGATIVE

## 2022-12-07 LAB — URINALYSIS, MICROSCOPIC (REFLEX)

## 2022-12-07 MED ORDER — ESCITALOPRAM OXALATE 20 MG PO TABS
20.0000 mg | ORAL_TABLET | Freq: Every day | ORAL | Status: DC
  Administered 2022-12-07 – 2022-12-14 (×8): 20 mg via ORAL
  Filled 2022-12-07 (×2): qty 1
  Filled 2022-12-07: qty 2
  Filled 2022-12-07 (×2): qty 1
  Filled 2022-12-07: qty 2
  Filled 2022-12-07 (×3): qty 1

## 2022-12-07 MED ORDER — ENSURE ENLIVE PO LIQD
237.0000 mL | Freq: Three times a day (TID) | ORAL | Status: DC
  Administered 2022-12-08 – 2022-12-12 (×6): 237 mL via ORAL

## 2022-12-07 MED ORDER — ONDANSETRON HCL 4 MG/2ML IJ SOLN: 4.0000 mg | Freq: Four times a day (QID) | INTRAMUSCULAR | Status: DC | PRN

## 2022-12-07 MED ORDER — PANTOPRAZOLE SODIUM 40 MG PO TBEC
40.0000 mg | DELAYED_RELEASE_TABLET | Freq: Every day | ORAL | Status: DC
  Administered 2022-12-08 – 2022-12-14 (×7): 40 mg via ORAL
  Filled 2022-12-07 (×7): qty 1

## 2022-12-07 MED ORDER — SODIUM CHLORIDE 0.9 % IV SOLN
2.0000 g | Freq: Once | INTRAVENOUS | Status: AC
  Administered 2022-12-07: 2 g via INTRAVENOUS
  Filled 2022-12-07: qty 20

## 2022-12-07 MED ORDER — PANTOPRAZOLE SODIUM 40 MG IV SOLR
40.0000 mg | INTRAVENOUS | Status: DC
  Administered 2022-12-07: 40 mg via INTRAVENOUS
  Filled 2022-12-07: qty 10

## 2022-12-07 MED ORDER — LACTATED RINGERS IV BOLUS
1000.0000 mL | Freq: Once | INTRAVENOUS | Status: AC
  Administered 2022-12-07: 1000 mL via INTRAVENOUS

## 2022-12-07 MED ORDER — METOPROLOL SUCCINATE ER 25 MG PO TB24
25.0000 mg | ORAL_TABLET | Freq: Two times a day (BID) | ORAL | Status: DC
  Administered 2022-12-07 – 2022-12-13 (×13): 25 mg via ORAL
  Filled 2022-12-07 (×15): qty 1

## 2022-12-07 MED ORDER — SODIUM CHLORIDE 0.9 % IV SOLN
2.0000 g | INTRAVENOUS | Status: DC
  Administered 2022-12-08 – 2022-12-09 (×2): 2 g via INTRAVENOUS
  Filled 2022-12-07 (×2): qty 20

## 2022-12-07 MED ORDER — LACTATED RINGERS IV SOLN: INTRAVENOUS | Status: DC

## 2022-12-07 MED ORDER — ALPRAZOLAM 0.5 MG PO TABS
1.0000 mg | ORAL_TABLET | Freq: Two times a day (BID) | ORAL | Status: DC | PRN
  Administered 2022-12-07 – 2022-12-13 (×6): 1 mg via ORAL
  Filled 2022-12-07 (×6): qty 2

## 2022-12-07 MED ORDER — TAMSULOSIN HCL 0.4 MG PO CAPS
0.4000 mg | ORAL_CAPSULE | Freq: Every day | ORAL | Status: DC
  Administered 2022-12-07 – 2022-12-14 (×8): 0.4 mg via ORAL
  Filled 2022-12-07 (×8): qty 1

## 2022-12-07 MED ORDER — ONDANSETRON 4 MG PO TBDP
4.0000 mg | ORAL_TABLET | Freq: Once | ORAL | Status: AC
  Administered 2022-12-07: 4 mg via ORAL
  Filled 2022-12-07: qty 1

## 2022-12-07 MED ORDER — HYDRALAZINE HCL 20 MG/ML IJ SOLN
5.0000 mg | INTRAMUSCULAR | Status: DC | PRN
  Administered 2022-12-09 – 2022-12-11 (×4): 5 mg via INTRAVENOUS
  Filled 2022-12-07 (×5): qty 1

## 2022-12-07 MED ORDER — METRONIDAZOLE 500 MG/100ML IV SOLN
500.0000 mg | Freq: Two times a day (BID) | INTRAVENOUS | Status: DC
  Administered 2022-12-07 – 2022-12-14 (×15): 500 mg via INTRAVENOUS
  Filled 2022-12-07 (×16): qty 100

## 2022-12-07 MED ORDER — ASPIRIN 81 MG PO CHEW
81.0000 mg | CHEWABLE_TABLET | Freq: Every day | ORAL | Status: DC
  Administered 2022-12-08 – 2022-12-14 (×7): 81 mg via ORAL
  Filled 2022-12-07 (×7): qty 1

## 2022-12-07 MED ORDER — ACETAMINOPHEN 325 MG PO TABS
650.0000 mg | ORAL_TABLET | Freq: Four times a day (QID) | ORAL | Status: DC | PRN
  Administered 2022-12-07 – 2022-12-08 (×2): 650 mg via ORAL
  Filled 2022-12-07 (×2): qty 2

## 2022-12-07 MED ORDER — MORPHINE SULFATE (PF) 2 MG/ML IV SOLN: 2.0000 mg | INTRAVENOUS | Status: DC | PRN

## 2022-12-07 MED ORDER — ACETAMINOPHEN 650 MG RE SUPP: 650.0000 mg | Freq: Four times a day (QID) | RECTAL | Status: DC | PRN

## 2022-12-07 MED ORDER — ONDANSETRON HCL 4 MG PO TABS: 4.0000 mg | ORAL_TABLET | Freq: Four times a day (QID) | ORAL | Status: DC | PRN

## 2022-12-07 NOTE — ED Triage Notes (Incomplete)
Presents via EMS for weakness , emesis x2, dark urine  Admitted for sepsis 1 mo ago  EMS assessFebrile 100.40F, 118/72, 84bpm, 97%ra, CBG 233: received 500cc NS, tylenol '1000mg'$  pta, NSR   20G L forearm

## 2022-12-07 NOTE — H&P (Addendum)
History and Physical    Patient: Francisco [name] MWN:[id number] DOB: 1941-03-06 DOA: 12/07/2022 DOS: the patient was seen and examined on 12/07/2022 PCP: [name], FNP  Patient coming from: Home - lives alone but daughters rotate in staying with him a week at a time; NOK: Daughter, Francisco [name], [phone]   Chief Complaint: Weakness  HPI: Francisco [name] is a 82 y.o. male with medical history significant of HTN, HLD, stage 3a CKD, and BPH presenting with weakness. He was last admitted from 10/3-4 with C4 cervical fracture s/p fusion.  Prior to that, he was admitted in June for E coli bacteremia and from 8/20-29 with acute on chronic cholecystitis requiring a laparascopic subtotal fenestrating cholecystectomy with drain placement.  He had a bile leak post-operatively that required placement of a CBD stent via ERCP.  He reports that he previously had sepsis.  He had his gallbladder removed and was doing good but then fell and hit his head on the bathtub and ruptured a disc, requiring fusion.  He hasn't done well since then.  He doesn't feel good, can't eat, doesn't swallow well.  2-3 times, he has had the shakes - and that is why he is here now.  Can't control the shaking.  Yesterday, he had rigors and couldn't stop it, vomiting (no blood).  With his swallow, even water, if feels like it gets halfway down and won't go down.  Food is the same way.  He was supposed to go to Dr. Christella Noa today for f/u and he was going to arrange a swallow test.  He sometimes cough/chokes/sputters, other times it feels like it just won't go down.  He has a stent still in place.  Periodic fever subjectively.  +urinary symptoms - trouble starting.  Mild dysuria at times.      ER Course:  Carryover, per Dr. Marlowe Sax:  Admitted in August 2023 for acute on chronic cholecystitis and underwent laparoscopic subtotal fenestrating cholecystectomy with drain placement.  Postop HIDA scan was concerning for bile leak and  underwent ERCP and CBD stent placement.  He presents to the ED today with fever, chills, and vomiting.  Temperature 100.2 F with EMS.  WBC 15.8, creatinine 1.8 (baseline 1.0-1.2), AST 90, ALT 130, alk phos 411, T. bili 3.9, lactic acid normal, UA with positive nitrite.  Right upper quadrant ultrasound showing no biliary dilation.  [facility] (Dr. Tarri Glenn) consulted via secure chat. He was given ceftriaxone, Zofran, and 1 L LR bolus.      Review of Systems: As mentioned in the history of present illness. All other systems reviewed and are negative. Past Medical History:  Diagnosis Date   Anxiety    GERD (gastroesophageal reflux disease)    Hypertension    Vertigo    Past Surgical History:  Procedure Laterality Date   AMPUTATION Left 08/05/2016   Procedure: REVISION AMPUTATION LEFT INDEX FINGER REPAIR LACERATION LEFT THUMB;  Surgeon: [name], MD;  Location: [facility];  Service: Orthopedics;  Laterality: Left;   BILIARY STENT PLACEMENT N/A 07/09/2022   Procedure: BILIARY STENT PLACEMENT;  Surgeon: Ladene Artist, MD;  Location: [facility];  Service: Gastroenterology;  Laterality: N/A;   CHOLECYSTECTOMY N/A 07/05/2022   Procedure: LAPAROSCOPIC CHOLECYSTECTOMY;  Surgeon: Dwan Bolt, MD;  Location: [facility];  Service: General;  Laterality: N/A;   ERCP N/A 07/09/2022   Procedure: ENDOSCOPIC RETROGRADE CHOLANGIOPANCREATOGRAPHY (ERCP);  Surgeon: Ladene Artist, MD;  Location: [facility];  Service: Gastroenterology;  Laterality: N/A;   POSTERIOR  CERVICAL FUSION/FORAMINOTOMY N/A 08/18/2022   Procedure: C4-5 Posterior cervical fusion with lateral mass fixation;  Surgeon: Ashok Pall, MD;  Location: [facility];  Service: Neurosurgery;  Laterality: N/A;   SPHINCTEROTOMY  07/09/2022   Procedure: SPHINCTEROTOMY;  Surgeon: Ladene Artist, MD;  Location: [facility];  Service: Gastroenterology;;   SPINE SURGERY     Social History:  reports that he quit smoking about 50 years ago. His smoking use  included cigarettes. He has a 15.00 pack-year smoking history. He has never used smokeless tobacco. He reports current alcohol use of about 1.0 standard drink of alcohol per week. He reports that he does not use drugs.  Allergies  Allergen Reactions   Crestor [Rosuvastatin] Other (See Comments)    Unknown per Pt   Lipitor [Atorvastatin] Other (See Comments)    Unknown per Pt   Penicillins     REACTION: swelling/hives Has patient had a PCN reaction causing immediate rash, facial/tongue/throat swelling, SOB or lightheadedness with hypotension:yes Has patient had a PCN reaction causing severe rash involving mucus membranes or skin necrosis: Yes Has patient had a PCN reaction that required hospitalization No Has patient had a PCN reaction occurring within the last 10 years: No If all of the above answers are "NO", then may proceed with Cephalosporin use.    Symbicort [Budesonide-Formoterol Fumarate] Other (See Comments)    Pain Behind ribs Unknown per Pt    Family History  Problem Relation Age of Onset   Healthy Mother    Transient ischemic attack Father    Rheum arthritis Daughter    Amblyopia Neg Hx    Blindness Neg Hx    Cataracts Neg Hx    Glaucoma Neg Hx    Macular degeneration Neg Hx    Retinal detachment Neg Hx    Strabismus Neg Hx    Retinitis pigmentosa Neg Hx     Prior to Admission medications   Medication Sig Start Date End Date Taking? Authorizing Provider  ALPRAZolam (XANAX) 1 MG tablet TAKE 1 TABLET BY MOUTH TWICE DAILY AS NEEDED FOR ANXIETY OR SLEEP Patient taking differently: Take 1 mg by mouth 2 (two) times daily as needed for sleep or anxiety. 11/17/22  Yes Hassell Done, Mary-Margaret, FNP  aspirin 81 MG tablet Take 81 mg by mouth daily.   Yes [provider]  escitalopram (LEXAPRO) 10 MG tablet Take 2 tablets (20 mg total) by mouth daily. 08/14/22  Yes Martin, Mary-Margaret, FNP  furosemide (LASIX) 20 MG tablet Take 1 tablet (20 mg total) by mouth daily.  08/14/22  Yes Hassell Done, Mary-Margaret, FNP  meclizine (ANTIVERT) 25 MG tablet Take 1 tablet (25 mg total) by mouth 2 (two) times daily. 09/22/22  Yes Hassell Done, Mary-Margaret, FNP  metoprolol succinate (TOPROL-XL) 25 MG 24 hr tablet TAKE 1 TABLET BY MOUTH TWICE  DAILY 07/13/22  Yes Park Liter, MD  pantoprazole (PROTONIX) 20 MG tablet TAKE 1 TABLET BY MOUTH DAILY 09/24/22  Yes Hassell Done, Mary-Margaret, FNP  polyethylene glycol (MIRALAX / GLYCOLAX) 17 g packet Take 17 g by mouth 2 (two) times daily. Patient taking differently: Take 17 g by mouth 2 (two) times daily as needed for mild constipation or moderate constipation. 07/14/22  Yes Meuth, Brooke A, PA-C  simvastatin (ZOCOR) 80 MG tablet Take 1 tablet (80 mg total) by mouth daily. 08/14/22  Yes Hassell Done, Mary-Margaret, FNP  tamsulosin (FLOMAX) 0.4 MG CAPS capsule Take 1 capsule (0.4 mg total) by mouth daily. 08/14/22  Yes Hassell Done, Mary-Margaret, FNP  acetaminophen (TYLENOL) 325 MG  tablet Take 650 mg by mouth every 6 (six) hours as needed for mild pain or fever.    [provider]  docusate sodium (COLACE) 100 MG capsule Take 1 capsule (100 mg total) by mouth 2 (two) times daily. Patient not taking: Reported on 12/07/2022 07/14/22   Margie Billet A, PA-C  fish oil-omega-3 fatty acids 1000 MG capsule Take 2 g by mouth daily. Patient not taking: Reported on 12/07/2022    [provider]    Physical Exam: Vitals:   12/07/22 0532 12/07/22 0600 12/07/22 0630 12/07/22 0845  BP:  (!) 142/75 (!) 140/73   Pulse:  74 72   Resp:  13 12   Temp: 97.8 F (36.6 C)   97.9 F (36.6 C)  TempSrc: Oral   Oral  SpO2:  99% 97%   Weight:       General:  Appears frail and chronically ill but in NAD Eyes:  PERRL, EOMI, normal lids, iris ENT:  grossly normal hearing, tongue, apparent hemangioma on left lower lip mmm; poor/absent dentition Neck:  no LAD, masses or thyromegaly Cardiovascular:  RRR, no m/r/g. No LE edema.  Respiratory:   CTA bilaterally  with no wheezes/rales/rhonchi.  Normal respiratory effort. Abdomen:  soft, NT, ND Skin:  no rash or induration seen on limited exam Musculoskeletal:  grossly normal tone BUE/BLE, good ROM, no bony abnormality Psychiatric:  blunted mood and affect, speech fluent and appropriate, AOx3 Neurologic:  CN 2-12 grossly intact, moves all extremities in coordinated fashion   Radiological Exams on Admission: Independently reviewed - see discussion in A/P where applicable  US Abdomen Limited  Result Date: 12/07/2022 CLINICAL DATA:  Elevated liver function tests. History of biliary stent EXAM: ULTRASOUND ABDOMEN LIMITED RIGHT UPPER QUADRANT COMPARISON:  Abdominal CT 07/12/2022 FINDINGS: Gallbladder: Surgically absent Common bile duct: Diameter: 7 mm.  Where visualized, no filling defect. Liver: No focal lesion identified. Within normal limits in parenchymal echogenicity. Portal vein is patent on color Doppler imaging with normal direction of blood flow towards the liver. IMPRESSION: Cholecystectomy without biliary dilatation. Electronically Signed   By: Jorje Guild M.D.   On: 12/07/2022 05:45   DG Chest 2 View  Result Date: 12/07/2022 CLINICAL DATA:  Fever. EXAM: CHEST - 2 VIEW COMPARISON:  July 10, 2022 FINDINGS: The heart size and mediastinal contours are within normal limits. There is moderate severity calcification of the aortic arch. Both lungs are clear. A subcentimeter radiopaque soft tissue foreign body is seen within the anterior aspect of the right shoulder. A radiopaque fusion plate and screws are seen overlying the lower cervical spine. Multilevel degenerative changes are noted throughout the thoracic spine. IMPRESSION: No active cardiopulmonary disease. Electronically Signed   By: Virgina Norfolk M.D.   On: 12/07/2022 01:38    EKG: Independently reviewed.  NSR with rate 88; no evidence of acute ischemia   Labs on Admission: I have personally reviewed the available labs and imaging  studies at the time of the admission.  Pertinent labs:    Glucose 218 BUN 20/Creatinine 1.88/GFR 35; 20/1.2/61 on 9/29 AP 411 Albumin 3.2 AST 90/ALT 130/Bili 3.9 - normal on 9/29 Lactate 1.2 WBC 15.8 Hgb 12.2 COVID/flu/RSV negative UA: large bili, 100 glucose, 40 ketones, + nitrite, 30 protein, few bacteria Blood and urine cultures pending   Assessment and Plan: Principal Problem:   Failure to thrive in adult Active Problems:   Essential hypertension   BPH (benign prostatic hyperplasia)   GAD (generalized anxiety disorder)   UTI (  urinary tract infection)   Elevated LFTs   Acute kidney injury superimposed on chronic kidney disease ([place])   Dysphagia   Dyslipidemia    Failure to thrive -Patient had a rough 2023, with multiple hospitalizations -Since his August cholecystectomy, he has had difficulty with weakness and fell in October with C-spine fracture s/p repair -He reports ongoing issues since with dysphagia and generalized weakness and anorexia -While there may be an organic and reversible process in play (such as UTI, see below), the GI issue is a significant concern -Will admit to med surg with PT/OT/ST/nutrition/TOC evaluations -His family is providing caregiver assistance and yet he may require more care over time -He may benefit from palliative care evaluation for goals of care depending on how his clinical course evolves  AKI on stage 3a CKD -Also with ketonuria -Likely associated with poor PO intake, acute vs. Chronic -Given IVF -Attempt to avoid nephrotoxic medications -Recheck BMP in AM   Dysphagia -He reports liquid/solid pill dysphagia since at least his surgery in October but probably even before then -Will request GI consult, possible need for esophogram -Speech therapy swallow evaluation  Elevated LFTs -Normal in September, now elevated -Hold statin -Has CBD stent in place, ?need for removal -GI consult is pending -RUQ was ok -Still, given LFT  elevation will broaden abx coverage to Rocephin + Flagyl for now -Recheck LFTs in AM  Possible UTI -UA clearly indicates biliary dysfunction and dehydration but also with + nitrites and a few bacteria -Has leukocytosis -He was given Rocephin in the ER -Blood and urine cultures are pending -Abx broadened to Rocephin + Flagyl (PCN allergy) to cover intra-abdominal pathology for now, as well  HTN -Continue Toprol XL  HLD -Hold simvastatin in the setting of elevated LFTs -Continue ASA, resuming tomorrow  Anxiety -Continue Lexepro, prn alprazolam  BPH -Continue tamsulosin  DNR -I have discussed code status with the patient and he would not desire resuscitation and would prefer to die a natural death should that situation arise. -He will need a gold out of facility DNR form at the time of discharge     Advance Care Planning:   Code Status: DNR   Consults: GI; PT/OT/Nutrition/ST/TOC team  DVT Prophylaxis: SCDs  Family Communication: None present; I was unable to reach his daughter by telephone at the time of admission  Severity of Illness: The appropriate patient status for this patient is INPATIENT. Inpatient status is judged to be reasonable and necessary in order to provide the required intensity of service to ensure the patient's safety. The patient's presenting symptoms, physical exam findings, and initial radiographic and laboratory data in the context of their chronic comorbidities is felt to place them at high risk for further clinical deterioration. Furthermore, it is not anticipated that the patient will be medically stable for discharge from the hospital within 2 midnights of admission.   * I certify that at the point of admission it is my clinical judgment that the patient will require inpatient hospital care spanning beyond 2 midnights from the point of admission due to high intensity of service, high risk for further deterioration and high frequency of surveillance  required.*  Author: Karmen Bongo, MD 12/07/2022 10:43 AM  For on call review [email or web address].

## 2022-12-07 NOTE — H&P (View-Only) (Signed)
[facility] Consult: 9:35 AM 12/07/2022  LOS: 0 days    Referring Provider: Dr Thomasenia Bottoms  Primary Care Physician:  [name], FNP Primary Gastroenterologist: Dr Henrene Pastor    Reason for Consultation:  Dysphagia, elevated LFTs,    HPI: Francisco [name] is a 82 y.o. male.  Hx htn.  HLD.  CKD 3a.  Adenomatous colon polyps 2006, 2011.  04/2022 gram-negative bacteremia.  Chronic constipation.  Anemia, Hb 11.2 in late August 2023.  08/2022 cervical spine fusion/fixation after fall, C4 fx, ruptured cervical ligament.  08/2005 colonoscopy with colon polyps (adenomas) and diverticulosis 07/2010 colonoscopy severe diverticulosis throughout colon.  Pedunculated polyp (TA) at mid transverse colon removed.   07/05/2022 Laparoscopic, subtotal cholecystectomy, drain placement.   07/09/2022 ERCP with sphincterotomy, biliary stent placement to address postop bile leak.  The limited esophageal visualization during ERCP demonstrated a mild, nonobstructing, distal esophageal stricture but was otherwise normal. No follow-up with GI since August.  Describes malaise, FTT, anorexia, 40 #wt loss since 06/2022.  C spine surgery precipitated by syncope, fall in tub.  "Years" of liquid and solid dysphagia, progressive.  Feels substances sticking at level of upper cervical esophagus.  Sometimes chokes on saliva.  Belches a lot.  Sometimes regurgitate/vomits up liquids and solids but no events of persistent impaction.  Regurgitant is clear or white/foamy.  Brown stools 2-3 times a week, last BM was 11/2019, Saturday.  Denies abdominal pain.  Endorses small, mid abdominal hernia. Denies use of APAP other than rare, occasional use.    Normotensive.  Afebrile.  Normal heart rate.  Excellent room air sats. T. bili 3.9.  Alk phos 411.  AST/ALT  90/130. GFR 35.  Lactate normal. WBC 15.8.  Hgb 12.2.  MCV 97.  Platelets 246. Urinalysis positive for nitrites, large bilirubin.  Negative for leukocytes.  Proteinuria and ketones present.  Urine culture pending. RUQ ultrasound: 7 mm bile duct.  PV Dopplers normal.  Surgically absent gallbladder.  No stent visualized.  Widower since 2018.  His home is in [facility] outside of [place].  Since return home in August, both of his daughters alternate staying with him for a week at a time.  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  No smoking/tobacco.  Past Medical History:  Diagnosis Date   Anxiety    GERD (gastroesophageal reflux disease)    Hypertension    Vertigo     Past Surgical History:  Procedure Laterality Date   AMPUTATION Left 08/05/2016   Procedure: REVISION AMPUTATION LEFT INDEX FINGER REPAIR LACERATION LEFT THUMB;  Surgeon: [name], MD;  Location: [facility];  Service: Orthopedics;  Laterality: Left;   BILIARY STENT PLACEMENT N/A 07/09/2022   Procedure: BILIARY STENT PLACEMENT;  Surgeon: Ladene Artist, MD;  Location: [facility];  Service: Gastroenterology;  Laterality: N/A;   CHOLECYSTECTOMY N/A 07/05/2022   Procedure: LAPAROSCOPIC CHOLECYSTECTOMY;  Surgeon: Dwan Bolt, MD;  Location: [facility];  Service: General;  Laterality: N/A;   ERCP N/A 07/09/2022   Procedure: ENDOSCOPIC  RETROGRADE CHOLANGIOPANCREATOGRAPHY (ERCP);  Surgeon: Ladene Artist, MD;  Location: [facility];  Service: Gastroenterology;  Laterality: N/A;   POSTERIOR CERVICAL FUSION/FORAMINOTOMY N/A 08/18/2022   Procedure: C4-5 Posterior cervical fusion with lateral mass fixation;  Surgeon: Ashok Pall, MD;  Location: [facility];  Service: Neurosurgery;  Laterality: N/A;   SPHINCTEROTOMY  07/09/2022   Procedure: SPHINCTEROTOMY;  Surgeon: Ladene Artist, MD;  Location: [facility];  Service: Gastroenterology;;   SPINE SURGERY      Prior to  Admission medications   Medication Sig Start Date End Date Taking? Authorizing Provider  ALPRAZolam (XANAX) 1 MG tablet TAKE 1 TABLET BY MOUTH TWICE DAILY AS NEEDED FOR ANXIETY OR SLEEP Patient taking differently: Take 1 mg by mouth 2 (two) times daily as needed for sleep or anxiety. 11/17/22  Yes Hassell Done, Mary-Margaret, FNP  aspirin 81 MG tablet Take 81 mg by mouth daily.   Yes [provider]  escitalopram (LEXAPRO) 10 MG tablet Take 2 tablets (20 mg total) by mouth daily. 08/14/22  Yes Martin, Mary-Margaret, FNP  furosemide (LASIX) 20 MG tablet Take 1 tablet (20 mg total) by mouth daily. 08/14/22  Yes Hassell Done, Mary-Margaret, FNP  meclizine (ANTIVERT) 25 MG tablet Take 1 tablet (25 mg total) by mouth 2 (two) times daily. 09/22/22  Yes Hassell Done, Mary-Margaret, FNP  metoprolol succinate (TOPROL-XL) 25 MG 24 hr tablet TAKE 1 TABLET BY MOUTH TWICE  DAILY 07/13/22  Yes Park Liter, MD  pantoprazole (PROTONIX) 20 MG tablet TAKE 1 TABLET BY MOUTH DAILY 09/24/22  Yes Hassell Done, Mary-Margaret, FNP  polyethylene glycol (MIRALAX / GLYCOLAX) 17 g packet Take 17 g by mouth 2 (two) times daily. Patient taking differently: Take 17 g by mouth 2 (two) times daily as needed for mild constipation or moderate constipation. 07/14/22  Yes Meuth, Brooke A, PA-C  simvastatin (ZOCOR) 80 MG tablet Take 1 tablet (80 mg total) by mouth daily. 08/14/22  Yes Hassell Done, Mary-Margaret, FNP  tamsulosin (FLOMAX) 0.4 MG CAPS capsule Take 1 capsule (0.4 mg total) by mouth daily. 08/14/22  Yes Hassell Done, Mary-Margaret, FNP  acetaminophen (TYLENOL) 325 MG tablet Take 650 mg by mouth every 6 (six) hours as needed for mild pain or fever.    [provider]    Scheduled Meds:  [START ON 12/08/2022] aspirin  81 mg Oral Daily   escitalopram  20 mg Oral Daily   metoprolol succinate  25 mg Oral BID   pantoprazole (PROTONIX) IV  40 mg Intravenous Q24H   tamsulosin  0.4 mg Oral Daily   Infusions:  lactated ringers     PRN  Meds: acetaminophen **OR** acetaminophen, ALPRAZolam, hydrALAZINE, morphine injection, ondansetron **OR** ondansetron (ZOFRAN) IV   Allergies as of 12/07/2022 - Review Complete 12/07/2022  Allergen Reaction Noted   Crestor [rosuvastatin] Other (See Comments) 04/12/2013   Lipitor [atorvastatin] Other (See Comments) 04/12/2013   Penicillins  07/15/2010   Symbicort [budesonide-formoterol fumarate] Other (See Comments) 03/25/2015    Family History  Problem Relation Age of Onset   Healthy Mother    Transient ischemic attack Father    Rheum arthritis Daughter    Amblyopia Neg Hx    Blindness Neg Hx    Cataracts Neg Hx    Glaucoma Neg Hx    Macular degeneration Neg Hx    Retinal detachment Neg Hx    Strabismus Neg Hx    Retinitis pigmentosa Neg Hx     Social History   Socioeconomic History   Marital status: Widowed    Spouse  name: Not on file   Number of children: 2   Years of education: Not on file   Highest education level: Not on file  Occupational History   Occupation: Retired    Fish farm manager: [employer]  Tobacco Use   Smoking status: Former    Packs/day: 1.00    Years: 15.00    Total pack years: 15.00    Types: Cigarettes    Quit date: 02/23/1972    Years since quitting: 50.8   Smokeless tobacco: Never  Vaping Use   Vaping Use: Never used  Substance and Sexual Activity   Alcohol use: Yes    Alcohol/week: 1.0 standard drink of alcohol    Types: 1 Standard drinks or equivalent per week    Comment: occasional beer, very  little   Drug use: No   Sexual activity: Not Currently  Other Topics Concern   Not on file  Social History Narrative   2 daughters nearby   Social Determinants of Health   Financial Resource Strain: Low Risk  (08/20/2022)   Overall Financial Resource Strain (CARDIA)    Difficulty of Paying Living Expenses: Not hard at all  Food Insecurity: No Food Insecurity (08/08/2021)   Hunger Vital Sign    Worried About Running Out of Food in the Last Year:  Never true    Ran Out of Food in the Last Year: Never true  Transportation Needs: No Transportation Needs (08/20/2022)   PRAPARE - Hydrologist (Medical): No    Lack of Transportation (Non-Medical): No  Physical Activity: Inactive (08/08/2021)   Exercise Vital Sign    Days of Exercise per Week: 0 days    Minutes of Exercise per Session: 0 min  Stress: No Stress Concern Present (08/08/2021)   [place]    Feeling of Stress : Not at all  Social Connections: Socially Isolated (08/08/2021)   Social Connection and Isolation Panel [NHANES]    Frequency of Communication with Friends and Family: More than three times a week    Frequency of Social Gatherings with Friends and Family: More than three times a week    Attends Religious Services: Never    Marine scientist or Organizations: No    Attends Archivist Meetings: Never    Marital Status: Widowed  Intimate Partner Violence: Not At Risk (08/08/2021)   Humiliation, Afraid, Rape, and Kick questionnaire    Fear of Current or Ex-Partner: No    Emotionally Abused: No    Physically Abused: No    Sexually Abused: No    REVIEW OF SYSTEMS: Constitutional: Weakness, fatigue. ENT:  No nose bleeds Pulm: Cough productive of clear and white foamy sputum. CV:  No palpitations, no LE edema.  No angina.  No tachycardia. GU:  No hematuria, no frequency GI: See HPI. Heme: Other than purpura on forearms, no unusual bruising or excessive bleeding. Transfusions: No prior transfusion with blood products. Neuro:  No headaches, no peripheral tingling or numbness.  No seizures, no syncope, no memory lapses. Derm:  No itching, no rash or sores.  Endocrine:  No sweats or chills.  No polyuria or dysuria Immunization: Reviewed. Travel: Not queried.   PHYSICAL EXAM: Vital signs in last 24 hours: Vitals:   12/07/22 0600 12/07/22 0630  BP: (!) 142/75  (!) 140/73  Pulse: 74 72  Resp: 13 12  Temp:    SpO2: 99% 97%   Wt Readings from Last 3  Encounters:  12/07/22 77.1 kg  08/18/22 81.6 kg  08/12/22 81.6 kg    General: Patient is frail, aged.  Resting comfortably in bed.  Head: No facial asymmetry or swelling.  No signs of head trauma. Eyes: ? Subtle scleral icterus.  No conjunctival pallor Ears: No obvious hearing loss. Nose: No congestion or discharge Mouth: Tongue midline.  Mucosa pink, moist, clear.  Poor dentition but most native teeth intact. Neck: No JVD, no masses, no thyromegaly Lungs: Clear.  No labored breathing or cough Heart: RRR.  No MRG.  S1, S2 present. Abdomen: Soft without tenderness or distention.  Active bowel sounds.  Small hernia mid abdomen apparent with muscular engagement but not at rest..   Rectal: Deferred. Musc/Skeltl: No joint redness, swelling or gross deformity. Extremities: No CCE. Neurologic: Alert.  Appropriate.  Oriented x 3.  Moves all 4 limbs without tremor or gross weakness.  Formal strength testing not pursued. Skin: No rash, no sores, no telangiectasia. Nodes: No cervical adenopathy Psych: Slightly blunted affect but pleasant, engaged.  Intake/Output from previous day: No intake/output data recorded. Intake/Output this shift: No intake/output data recorded.  LAB RESULTS: Recent Labs    12/07/22 0111  WBC 15.8*  HGB 12.2*  HCT 37.5*  PLT 246   BMET Lab Results  Component Value Date   NA 136 12/07/2022   NA 139 08/14/2022   NA 142 07/17/2022   K 4.0 12/07/2022   K 4.3 08/14/2022   K 4.5 07/17/2022   CL 100 12/07/2022   CL 100 08/14/2022   CL 105 07/17/2022   CO2 24 12/07/2022   CO2 25 08/14/2022   CO2 24 07/17/2022   GLUCOSE 218 (H) 12/07/2022   GLUCOSE 114 (H) 08/14/2022   GLUCOSE 143 (H) 07/17/2022   BUN 20 12/07/2022   BUN 20 08/14/2022   BUN 13 07/17/2022   CREATININE 1.88 (H) 12/07/2022   CREATININE 1.20 08/14/2022   CREATININE 1.10 07/17/2022   CALCIUM 8.9  12/07/2022   CALCIUM 8.9 08/14/2022   CALCIUM 8.4 (L) 07/17/2022   LFT Recent Labs    12/07/22 0111  PROT 6.6  ALBUMIN 3.2*  AST 90*  ALT 130*  ALKPHOS 411*  BILITOT 3.9*   PT/INR Lab Results  Component Value Date   INR 1.2 07/05/2022   INR 1.4 (H) 05/06/2022   INR 1.1 05/05/2022   Hepatitis Panel No results for input(s): "HEPBSAG", "HCVAB", "HEPAIGM", "HEPBIGM" in the last 72 hours. C-Diff No components found for: "CDIFF" Lipase     Component Value Date/Time   LIPASE 32 07/10/2022 0445    Drugs of Abuse  No results found for: "LABOPIA", "COCAINSCRNUR", "LABBENZ", "AMPHETMU", "THCU", "LABBARB"   RADIOLOGY STUDIES: US Abdomen Limited  Result Date: 12/07/2022 CLINICAL DATA:  Elevated liver function tests. History of biliary stent EXAM: ULTRASOUND ABDOMEN LIMITED RIGHT UPPER QUADRANT COMPARISON:  Abdominal CT 07/12/2022 FINDINGS: Gallbladder: Surgically absent Common bile duct: Diameter: 7 mm.  Where visualized, no filling defect. Liver: No focal lesion identified. Within normal limits in parenchymal echogenicity. Portal vein is patent on color Doppler imaging with normal direction of blood flow towards the liver. IMPRESSION: Cholecystectomy without biliary dilatation. Electronically Signed   By: Jorje Guild M.D.   On: 12/07/2022 05:45   DG Chest 2 View  Result Date: 12/07/2022 CLINICAL DATA:  Fever. EXAM: CHEST - 2 VIEW COMPARISON:  July 10, 2022 FINDINGS: The heart size and mediastinal contours are within normal limits. There is moderate severity calcification of the aortic arch. Both  lungs are clear. A subcentimeter radiopaque soft tissue foreign body is seen within the anterior aspect of the right shoulder. A radiopaque fusion plate and screws are seen overlying the lower cervical spine. Multilevel degenerative changes are noted throughout the thoracic spine. IMPRESSION: No active cardiopulmonary disease. Electronically Signed   By: Virgina Norfolk M.D.   On:  12/07/2022 01:38      IMPRESSION:   FTT since subtotal cholecystectomy, bile leak requiring biliary stent placement in August 2023  Dysphagia to solids and liquids.  Rule out dysmotility.  The limited esophageal visualization during ERCP demonstrated a mild, nonobstructing, distal esophageal stricture but was otherwise normal.    Elevated LFTs.  Particularly elevated Alk phos.  Subtotal cholecystectomy and ERCP biliary stent placement for post op bile leak 06/2022.  Stent not present on ultrasound today.    Leukocytosis.  ? UTI.  Got Rocephin today.  Urine and blood clx pndg.      AKI.    Adenomatous colon polyps 2006, 2011  PLAN:       Ordered esophagram.  Allow clears for now, adjust diet based on egram.  Likely EGD tomorrow, orders placed.  May need SLP swallow eval.      Follow LFTs.  ? Need for CT imaging.  Zocor on hold.     Azucena Freed  12/07/2022, 9:35 AM Phone [phone]   Attending physician's note  I have taken a history, reviewed the chart and examined the patient. I performed a substantive portion of this encounter, including complete performance of at least one of the key components, in conjunction with the APP. I agree with the APP's note, impression and recommendations.    52 yr M s/p cervical spine fusion in 08/2022 after C4 fracture and ruptures cervical ligament with c/o worsening both solid and liquid dysphagia, failure to thrive  Will plan to proceed with EGD to exclude peptic stricture or erosive esophagitis. If negative will plan for barium esophagogram to evaluate for any significant esophageal dysmotility or extrinsic compression/osteophyte from C-spine  NPO after midnight  Elevated Bili, alk phose and transaminases RUQ ultrasound negative for CBD dilation or sludge. S/p cholecystetctomy  F/u hepatitis panel  ?Drug induced liver injury  On ceftriaxone for UTI, has mild leucocytosis  Monitor daily LFT  If bili continues to rise,  will plan to  proceed with MRCP to exclude biliary sludge/obstruction   The patient was provided an opportunity to ask questions and all were answered. The patient agreed with the plan and demonstrated an understanding of the instructions.  Damaris Hippo , MD [phone]

## 2022-12-07 NOTE — Evaluation (Addendum)
Clinical/Bedside Swallow Evaluation Patient Details  Name: Francisco [name] MRN: [id number] Date of Birth: Jan 12, 1941  Today's Date: 12/07/2022 Time: SLP Start Time (ACUTE ONLY): 8295 SLP Stop Time (ACUTE ONLY): 1442 SLP Time Calculation (min) (ACUTE ONLY): 14 min  Past Medical History:  Past Medical History:  Diagnosis Date   Anxiety    GERD (gastroesophageal reflux disease)    Hypertension    Vertigo    Past Surgical History:  Past Surgical History:  Procedure Laterality Date   AMPUTATION Left 08/05/2016   Procedure: REVISION AMPUTATION LEFT INDEX FINGER REPAIR LACERATION LEFT THUMB;  Surgeon: [name], MD;  Location: [facility];  Service: Orthopedics;  Laterality: Left;   BILIARY STENT PLACEMENT N/A 07/09/2022   Procedure: BILIARY STENT PLACEMENT;  Surgeon: Ladene Artist, MD;  Location: [facility];  Service: Gastroenterology;  Laterality: N/A;   CHOLECYSTECTOMY N/A 07/05/2022   Procedure: LAPAROSCOPIC CHOLECYSTECTOMY;  Surgeon: Dwan Bolt, MD;  Location: [facility];  Service: General;  Laterality: N/A;   ERCP N/A 07/09/2022   Procedure: ENDOSCOPIC RETROGRADE CHOLANGIOPANCREATOGRAPHY (ERCP);  Surgeon: Ladene Artist, MD;  Location: [facility];  Service: Gastroenterology;  Laterality: N/A;   POSTERIOR CERVICAL FUSION/FORAMINOTOMY N/A 08/18/2022   Procedure: C4-5 Posterior cervical fusion with lateral mass fixation;  Surgeon: Ashok Pall, MD;  Location: [facility];  Service: Neurosurgery;  Laterality: N/A;   SPHINCTEROTOMY  07/09/2022   Procedure: SPHINCTEROTOMY;  Surgeon: Ladene Artist, MD;  Location: [facility];  Service: Gastroenterology;;   SPINE SURGERY     HPI:  [name] who presented with weakness and reported symptoms of dysphagia with emesis and c/o water not going past "halfway down". CXR negative for active disease. GI consulted who recommended esophagram, allowance of clear liquids (full liquid diet ordered), likely EGD on 1/23, and possible SLP swallow  eval which was subsequently ordered by hospitalist. PMH: HTN, HLD, stage 3a CKD, C4 cervical fracture s/p fusion (October, 2023), BPH, mild, nonobstructing, distal esophageal stricture noted on ERCP 07/09/22.    Assessment / Plan / Recommendation  Clinical Impression  Pt was seen for bedside swallow evaluation. He reported globus sensation at/just superior to the sternal notch and stated that solids and liquids "don't go all the way down". Oral mechanism exam was [facility]; dentition was adequate and natural. Trials were limited to full liquids per MD's recommendations. Pt exhibited symptoms of esophageal dysphagia characterized by eructation with all p.o. trials and report of puree and thin liquids "sticking". Per the pt, the sensation was improved slightly with a liquid wash. SLP is in agreement with GI consult. Pt's current diet of full liquids will be continued and SLP will follow pt briefly to assess need for further SLP intervention after esophagram and (possible) EGD are completed. SLP Visit Diagnosis: Dysphagia, unspecified (R13.10)    Aspiration Risk  Mild aspiration risk    Diet Recommendation  (continue full liquids per MD's recommendation)   Liquid Administration via: Cup;Straw Medication Administration: Whole meds with liquid (or srushed; per pt's tolerance) Supervision: Patient able to self feed Compensations: Slow rate;Follow solids with liquid Postural Changes: Seated upright at 90 degrees;Remain upright for at least 30 minutes after po intake    Other  Recommendations Recommended Consults: Consider GI evaluation (GI already ordered) Oral Care Recommendations: Oral care BID    Recommendations for follow up therapy are one component of a multi-disciplinary discharge planning process, led by the attending physician.  Recommendations may be updated based on patient status, additional functional  criteria and insurance authorization.  Follow up Recommendations        Assistance  Recommended at Discharge    Functional Status Assessment    Frequency and Duration min 1 x/week  1 week       Prognosis Prognosis for Safe Diet Advancement: Good      Swallow Study   General Date of Onset: 06/06/22 HPI: [name] who presented with weakness and reported symptoms of dysphagia with emesis and c/o water not going past "halfway down". CXR negative for active disease. GI consulted who recommended esophagram, allowance of clear liquids (full liquid diet ordered), likely EGD on 1/23, and possible SLP swallow eval which was subsequently ordered by hospitalist. PMH: HTN, HLD, stage 3a CKD, C4 cervical fracture s/p fusion (October, 2023), BPH, mild, nonobstructing, distal esophageal stricture noted on ERCP 07/09/22. Type of Study: Bedside Swallow Evaluation Previous Swallow Assessment: none Diet Prior to this Study: Thin liquids (full liquids) Temperature Spikes Noted: No Respiratory Status: Room air History of Recent Intubation: No Behavior/Cognition: Alert;Cooperative;Pleasant mood Oral Cavity Assessment: Within Functional Limits Oral Care Completed by SLP: No Oral Cavity - Dentition: Adequate natural dentition Vision: Functional for self-feeding Self-Feeding Abilities: Able to feed self Patient Positioning: Upright in bed;Postural control adequate for testing Baseline Vocal Quality: Normal Volitional Cough: Strong Volitional Swallow: Able to elicit    Oral/Motor/Sensory Function Overall Oral Motor/Sensory Function: Within functional limits   Ice Chips Ice chips: Within functional limits Presentation: Spoon   Thin Liquid Thin Liquid: Within functional limits Presentation: Straw    Nectar Thick Nectar Thick Liquid: Not tested   Honey Thick Honey Thick Liquid: Not tested   Puree Puree: Within functional limits Presentation: Spoon   Solid     Solid:  (not tested)     Seniya Stoffers I. Hardin Negus, [place], [place] Office number  [phone]  Horton Marshall 12/07/2022,3:41 PM

## 2022-12-07 NOTE — Evaluation (Addendum)
Physical Therapy Evaluation Patient Details Name: Francisco [name] MRN: [id number] DOB: [birth year] Today's Date: 12/07/2022  History of Present Illness  Patient is 82 y.o. male presenting with weakness and c/o difficulty swallowing as well as emesis and rigors... PMH significant of HTN, HLD, stage 3a CKD, and BPH. Recently he was last admitted from 10/3-4 with C4 cervical fracture s/p fusion. Pt reports decline since fusion and difficulty eating/swallowing. Prior to that, he was admitted in June for E coli bacteremia and from 8/20-29 with acute on chronic cholecystitis requiring a laparascopic subtotal fenestrating cholecystectomy with drain placement.   Clinical Impression  Francisco [name] is 82 y.o. male admitted with above HPI and diagnosis. Patient is currently limited by functional impairments below (see PT problem list). Patient lives alone however his daughters have been alternating and staying with him since his surgery in October. He is independent with occasional use of SPC/RW for mobility at baseline. He reports independence with ADL's as well but does not wear shoes as they are too challenging to put or his daughters will help. Currently he requires min guard for transfers with RW and min guard/assist for gait with RW. Cues needed to look up for safe navigation and assist to guide walker position with turns. Patient will benefit from continued skilled PT interventions to address impairments and progress independence with mobility, recommending OPPT vs HHPT if family cannot provide transportation. Acute PT will follow and progress as able.        Recommendations for follow up therapy are one component of a multi-disciplinary discharge planning process, led by the attending physician.  Recommendations may be updated based on patient status, additional functional criteria and insurance authorization.  Follow Up Recommendations Outpatient PT (OPPT vs HHPT pending progress and pt's family's ability  to provide transport)      Assistance Recommended at Discharge Intermittent Supervision/Assistance  Patient can return home with the following  A little help with walking and/or transfers;A little help with bathing/dressing/bathroom;Assistance with cooking/housework;Help with stairs or ramp for entrance    Equipment Recommendations    Recommendations for Other Services       Functional Status Assessment Patient has had a recent decline in their functional status and demonstrates the ability to make significant improvements in function in a reasonable and predictable amount of time.     Precautions / Restrictions Precautions Precautions: Fall Restrictions Weight Bearing Restrictions: No      Mobility  Bed Mobility Overal bed mobility: Needs Assistance Bed Mobility: Supine to Sit, Sit to Supine     Supine to sit: Min guard, HOB elevated Sit to supine: Min guard   General bed mobility comments: guarding to bring LE's off EOB and raise trunk. pt able to lift both LE's onto bed at EOS to return to supine.    Transfers Overall transfer level: Needs assistance Equipment used: Rolling walker (2 wheels) Transfers: Sit to/from Stand Sit to Stand: Min guard           General transfer comment: use of bil UE's for power up from stretcher, guarding for safety.    Ambulation/Gait Ambulation/Gait assistance: Min guard, Min assist Gait Distance (Feet): 60 Feet Assistive device: Rolling walker (2 wheels) Gait Pattern/deviations: Step-through pattern, Decreased stride length Gait velocity: decr        Stairs            Wheelchair Mobility    Modified Rankin (Stroke Patients Only)       Balance Overall balance assessment: Needs assistance  Sitting-balance support: Bilateral upper extremity supported Sitting balance-Leahy Scale: Good     Standing balance support: Reliant on assistive device for balance, Bilateral upper extremity supported, During functional  activity Standing balance-Leahy Scale: Fair                               Pertinent Vitals/Pain Pain Assessment Pain Assessment: No/denies pain    Home Living Family/patient expects to be discharged to:: Private residence Living Arrangements: Children;Alone (2 dtrs take rotations to stay for a few days) Available Help at Discharge: Family;Available 24 hours/day Type of Home: House Home Access: Ramped entrance       Home Layout: One level Home Equipment: Conservation officer, nature (2 wheels);Cane - single point;Shower seat;BSC/3in1;Wheelchair - manual;Grab bars - tub/shower      Prior Function Prior Level of Function : Independent/Modified Independent;Driving             Mobility Comments: Recently using RW after fall ADLs Comments: daughter's were assisting with ADL and iADL after fall     Hand Dominance   Dominant Hand: Right    Extremity/Trunk Assessment   Upper Extremity Assessment Upper Extremity Assessment: Overall WFL for tasks assessed    Lower Extremity Assessment Lower Extremity Assessment: Overall WFL for tasks assessed    Cervical / Trunk Assessment Cervical / Trunk Assessment: Normal;Neck Surgery  Communication   Communication: No difficulties  Cognition Arousal/Alertness: Awake/alert Behavior During Therapy: WFL for tasks assessed/performed Overall Cognitive Status: Within Functional Limits for tasks assessed                                          General Comments      Exercises     Assessment/Plan    PT Assessment Patient needs continued PT services  PT Problem List Decreased strength;Decreased range of motion;Decreased activity tolerance;Decreased balance;Decreased mobility;Decreased knowledge of use of DME;Decreased safety awareness       PT Treatment Interventions DME instruction;Gait training;Stair training;Functional mobility training;Therapeutic activities;Therapeutic exercise;Balance training;Patient/family  education    PT Goals (Current goals can be found in the Care Plan section)  Acute Rehab PT Goals Patient Stated Goal: be able to eat easily and keep strength PT Goal Formulation: With patient Time For Goal Achievement: 12/21/22 Potential to Achieve Goals: Good    Frequency Min 3X/week     Co-evaluation               AM-PAC PT "6 Clicks" Mobility  Outcome Measure Help needed turning from your back to your side while in a flat bed without using bedrails?: A Little Help needed moving from lying on your back to sitting on the side of a flat bed without using bedrails?: A Little Help needed moving to and from a bed to a chair (including a wheelchair)?: A Little Help needed standing up from a chair using your arms (e.g., wheelchair or bedside chair)?: A Little Help needed to walk in hospital room?: A Little Help needed climbing 3-5 steps with a railing? : A Little 6 Click Score: 18    End of Session Equipment Utilized During Treatment: Gait belt Activity Tolerance: Patient tolerated treatment well Patient left: in bed;with call bell/phone within reach Nurse Communication: Mobility status PT Visit Diagnosis: Difficulty in walking, not elsewhere classified (R26.2);Unsteadiness on feet (R26.81)    Time: 7616-0737 PT Time Calculation (min) (ACUTE  ONLY): 20 min   Charges:   PT Evaluation $PT Eval Moderate Complexity: 1 Mod          Verner Mould, DPT Acute Rehabilitation Services Office [phone]  12/07/22 12:55 PM

## 2022-12-07 NOTE — Plan of Care (Signed)

## 2022-12-07 NOTE — ED Notes (Signed)
ED TO INPATIENT HANDOFF REPORT  ED Nurse Name and Phone #: [phone]  S Name/Age/Gender Francisco [name] 82 y.o. male Room/Bed: 043C/043C  Code Status   Code Status: DNR  Home/SNF/Other Home Patient oriented to: self, place, time, and situation Is this baseline? Yes   Triage Complete: Triage complete  Chief Complaint UTI (urinary tract infection) [N39.0] Elevated LFTs [R79.89]  Triage Note No notes on file   Allergies Allergies  Allergen Reactions   Crestor [Rosuvastatin] Other (See Comments)    Unknown per Pt   Lipitor [Atorvastatin] Other (See Comments)    Unknown per Pt   Penicillins     REACTION: swelling/hives Has patient had a PCN reaction causing immediate rash, facial/tongue/throat swelling, SOB or lightheadedness with hypotension:yes Has patient had a PCN reaction causing severe rash involving mucus membranes or skin necrosis: Yes Has patient had a PCN reaction that required hospitalization No Has patient had a PCN reaction occurring within the last 10 years: No If all of the above answers are "NO", then may proceed with Cephalosporin use.    Symbicort [Budesonide-Formoterol Fumarate] Other (See Comments)    Pain Behind ribs Unknown per Pt    Level of Care/Admitting Diagnosis ED Disposition     ED Disposition  Admit   Condition  --   [facility]: [facility] [[id number]]  Level of Care: Med-Surg [16]  May admit patient to [name] or [facility] if equivalent level of care is available:: Yes  Covid Evaluation: Asymptomatic - no recent exposure (last 10 days) testing not required  Diagnosis: Elevated LFTs [562130]  Admitting Physician: Karmen Bongo [[id number]]  Attending Physician: Karmen Bongo [[id number]]  Certification:: I certify this patient will need inpatient services for at least 2 midnights  Estimated Length of Stay: 4          B Medical/Surgery History Past Medical History:  Diagnosis Date   Anxiety    GERD  (gastroesophageal reflux disease)    Hypertension    Vertigo    Past Surgical History:  Procedure Laterality Date   AMPUTATION Left 08/05/2016   Procedure: REVISION AMPUTATION LEFT INDEX FINGER REPAIR LACERATION LEFT THUMB;  Surgeon: [name], MD;  Location: [facility];  Service: Orthopedics;  Laterality: Left;   BILIARY STENT PLACEMENT N/A 07/09/2022   Procedure: BILIARY STENT PLACEMENT;  Surgeon: Ladene Artist, MD;  Location: [facility];  Service: Gastroenterology;  Laterality: N/A;   CHOLECYSTECTOMY N/A 07/05/2022   Procedure: LAPAROSCOPIC CHOLECYSTECTOMY;  Surgeon: Dwan Bolt, MD;  Location: [facility];  Service: General;  Laterality: N/A;   ERCP N/A 07/09/2022   Procedure: ENDOSCOPIC RETROGRADE CHOLANGIOPANCREATOGRAPHY (ERCP);  Surgeon: Ladene Artist, MD;  Location: [facility];  Service: Gastroenterology;  Laterality: N/A;   POSTERIOR CERVICAL FUSION/FORAMINOTOMY N/A 08/18/2022   Procedure: C4-5 Posterior cervical fusion with lateral mass fixation;  Surgeon: Ashok Pall, MD;  Location: [facility];  Service: Neurosurgery;  Laterality: N/A;   SPHINCTEROTOMY  07/09/2022   Procedure: SPHINCTEROTOMY;  Surgeon: Ladene Artist, MD;  Location: [facility];  Service: Gastroenterology;;   SPINE SURGERY       A IV Location/Drains/Wounds Patient Lines/Drains/Airways Status     Active Line/Drains/Airways     Name Placement date Placement time Site Days   Peripheral IV 12/07/22 20 G Left;Posterior Hand 12/07/22  0307  Hand  less than 1   Peripheral IV 12/07/22 20 G Anterior;Left Forearm 12/07/22  --  Forearm  less than 1   Closed System Drain  1 Right;Lateral RLQ Bulb (JP) 19 Fr. 07/05/22  1443  RLQ  155   GI Stent 07/09/22  1612  --  151   Incision (Closed) 07/05/22 Abdomen 07/05/22  1457  -- 155   Incision (Closed) 08/18/22 Neck Other (Comment) 08/18/22  1516  -- 111   Incision - 4 Ports Abdomen Umbilicus Upper;Mid Upper;Right Lower;Right 07/05/22  1422  -- 155             Intake/Output Last 24 hours No intake or output data in the 24 hours ending 12/07/22 1201  Labs/Imaging Results for orders placed or performed during the hospital encounter of 12/07/22 (from the past 48 hour(s))  Lactic acid, plasma     Status: None   Collection Time: 12/07/22  1:11 AM  Result Value Ref Range   Lactic Acid, Venous 1.2 0.5 - 1.9 mmol/L    Comment: Performed at [facility], [place] [address], [place], [place] [address]  Comprehensive metabolic panel     Status: Abnormal   Collection Time: 12/07/22  1:11 AM  Result Value Ref Range   Sodium 136 135 - 145 mmol/L   Potassium 4.0 3.5 - 5.1 mmol/L   Chloride 100 98 - 111 mmol/L   CO2 24 22 - 32 mmol/L   Glucose, Bld 218 (H) 70 - 99 mg/dL    Comment: Glucose reference range applies only to samples taken after fasting for at least 8 hours.   BUN 20 8 - 23 mg/dL   Creatinine, Ser 1.88 (H) 0.61 - 1.24 mg/dL   Calcium 8.9 8.9 - 10.3 mg/dL   Total Protein 6.6 6.5 - 8.1 g/dL   Albumin 3.2 (L) 3.5 - 5.0 g/dL   AST 90 (H) 15 - 41 U/L   ALT 130 (H) 0 - 44 U/L   Alkaline Phosphatase 411 (H) 38 - 126 U/L   Total Bilirubin 3.9 (H) 0.3 - 1.2 mg/dL   GFR, Estimated 35 (L) >60 mL/min    Comment: (NOTE) Calculated using the CKD-EPI Creatinine Equation (2021)    Anion gap 12 5 - 15    Comment: Performed at [facility], [place] [address], [place], [place] [address]  CBC with Differential     Status: Abnormal   Collection Time: 12/07/22  1:11 AM  Result Value Ref Range   WBC 15.8 (H) 4.0 - 10.5 K/uL   RBC 3.84 (L) 4.22 - 5.81 MIL/uL   Hemoglobin 12.2 (L) 13.0 - 17.0 g/dL   HCT 37.5 (L) 39.0 - 52.0 %   MCV 97.7 80.0 - 100.0 fL   MCH 31.8 26.0 - 34.0 pg   MCHC 32.5 30.0 - 36.0 g/dL   RDW 13.5 11.5 - 15.5 %   Platelets 246 150 - 400 K/uL   nRBC 0.0 0.0 - 0.2 %   Neutrophils Relative % 94 %   Neutro Abs 14.8 (H) 1.7 - 7.7 K/uL   Lymphocytes Relative 2 %   Lymphs Abs 0.3 (L) 0.7 - 4.0 K/uL   Monocytes Relative 4 %    Monocytes Absolute 0.6 0.1 - 1.0 K/uL   Eosinophils Relative 0 %   Eosinophils Absolute 0.0 0.0 - 0.5 K/uL   Basophils Relative 0 %   Basophils Absolute 0.0 0.0 - 0.1 K/uL   Immature Granulocytes 0 %   Abs Immature Granulocytes 0.07 0.00 - 0.07 K/uL    Comment: Performed at [facility], [place] [address], [place], [place] [address]  Urinalysis, Routine w  reflex microscopic Urine, Clean Catch     Status: Abnormal   Collection Time: 12/07/22  1:11 AM  Result Value Ref Range   Color, Urine AMBER (A) YELLOW    Comment: BIOCHEMICALS MAY BE AFFECTED BY COLOR   APPearance CLEAR CLEAR   Specific Gravity, Urine 1.020 1.005 - 1.030   pH 5.0 5.0 - 8.0   Glucose, UA 100 (A) NEGATIVE mg/dL   Hgb urine dipstick NEGATIVE NEGATIVE   Bilirubin Urine LARGE (A) NEGATIVE   Ketones, ur 40 (A) NEGATIVE mg/dL   Protein, ur 30 (A) NEGATIVE mg/dL   Nitrite POSITIVE (A) NEGATIVE   Leukocytes,Ua NEGATIVE NEGATIVE    Comment: Performed at [facility] [address], [place], [place] [address]  Resp panel by RT-PCR (RSV, Flu A&B, Covid) Anterior Nasal Swab     Status: None   Collection Time: 12/07/22  1:11 AM   Specimen: Anterior Nasal Swab  Result Value Ref Range   SARS Coronavirus 2 by RT PCR NEGATIVE NEGATIVE    Comment: (NOTE) SARS-CoV-2 target nucleic acids are NOT DETECTED.  The SARS-CoV-2 RNA is generally detectable in upper respiratory specimens during the acute phase of infection. The lowest concentration of SARS-CoV-2 viral copies this assay can detect is 138 copies/mL. A negative result does not preclude SARS-Cov-2 infection and should not be used as the sole basis for treatment or other patient management decisions. A negative result may occur with  improper specimen collection/handling, submission of specimen other than nasopharyngeal swab, presence of viral mutation(s) within the areas targeted by this assay, and inadequate number of viral copies(<138 copies/mL). A negative  result must be combined with clinical observations, patient history, and epidemiological information. The expected result is Negative.  Fact Sheet for Patients:  EntrepreneurPulse.com.au  Fact Sheet for Healthcare Providers:  IncredibleEmployment.be  This test is no t yet approved or cleared by the [place] FDA and  has been authorized for detection and/or diagnosis of SARS-CoV-2 by FDA under an Emergency Use Authorization (EUA). This EUA will remain  in effect (meaning this test can be used) for the duration of the COVID-19 declaration under Section 564(b)(1) of the Act, 21 U.S.C.section 360bbb-3(b)(1), unless the authorization is terminated  or revoked sooner.       Influenza A by PCR NEGATIVE NEGATIVE   Influenza B by PCR NEGATIVE NEGATIVE    Comment: (NOTE) The Xpert Xpress SARS-CoV-2/FLU/RSV plus assay is intended as an aid in the diagnosis of influenza from Nasopharyngeal swab specimens and should not be used as a sole basis for treatment. Nasal washings and aspirates are unacceptable for Xpert Xpress SARS-CoV-2/FLU/RSV testing.  Fact Sheet for Patients: EntrepreneurPulse.com.au  Fact Sheet for Healthcare Providers: IncredibleEmployment.be  This test is not yet approved or cleared by the [place] FDA and has been authorized for detection and/or diagnosis of SARS-CoV-2 by FDA under an Emergency Use Authorization (EUA). This EUA will remain in effect (meaning this test can be used) for the duration of the COVID-19 declaration under Section 564(b)(1) of the Act, 21 U.S.C. section 360bbb-3(b)(1), unless the authorization is terminated or revoked.     Resp Syncytial Virus by PCR NEGATIVE NEGATIVE    Comment: (NOTE) Fact Sheet for Patients: EntrepreneurPulse.com.au  Fact Sheet for Healthcare Providers: IncredibleEmployment.be  This test is not yet  approved or cleared by the [place] FDA and has been authorized for detection and/or diagnosis of SARS-CoV-2 by FDA under an Emergency Use Authorization (EUA). This EUA will remain in effect (meaning  this test can be used) for the duration of the COVID-19 declaration under Section 564(b)(1) of the Act, 21 U.S.C. section 360bbb-3(b)(1), unless the authorization is terminated or revoked.  Performed at [facility], [place] [address], [place], [place] [address]   Urinalysis, Microscopic (reflex)     Status: Abnormal   Collection Time: 12/07/22  1:11 AM  Result Value Ref Range   RBC / HPF 0-5 0 - 5 RBC/hpf   WBC, UA 0-5 0 - 5 WBC/hpf   Bacteria, UA FEW (A) NONE SEEN   Squamous Epithelial / HPF 6-10 0 - 5 /HPF   Mucus PRESENT    Hyaline Casts, UA PRESENT     Comment: Performed at [facility], [place] [address] [place], [place] [address]   US Abdomen Limited  Result Date: 12/07/2022 CLINICAL DATA:  Elevated liver function tests. History of biliary stent EXAM: ULTRASOUND ABDOMEN LIMITED RIGHT UPPER QUADRANT COMPARISON:  Abdominal CT 07/12/2022 FINDINGS: Gallbladder: Surgically absent Common bile duct: Diameter: 7 mm.  Where visualized, no filling defect. Liver: No focal lesion identified. Within normal limits in parenchymal echogenicity. Portal vein is patent on color Doppler imaging with normal direction of blood flow towards the liver. IMPRESSION: Cholecystectomy without biliary dilatation. Electronically Signed   By: Jorje Guild M.D.   On: 12/07/2022 05:45   DG Chest 2 View  Result Date: 12/07/2022 CLINICAL DATA:  Fever. EXAM: CHEST - 2 VIEW COMPARISON:  July 10, 2022 FINDINGS: The heart size and mediastinal contours are within normal limits. There is moderate severity calcification of the aortic arch. Both lungs are clear. A subcentimeter radiopaque soft tissue foreign body is seen within the anterior aspect of the right shoulder. A radiopaque fusion plate and screws are  seen overlying the lower cervical spine. Multilevel degenerative changes are noted throughout the thoracic spine. IMPRESSION: No active cardiopulmonary disease. Electronically Signed   By: Virgina Norfolk M.D.   On: 12/07/2022 01:38    Pending Labs Unresulted Labs (From admission, onward)     Start     Ordered   12/08/22 0500  Comprehensive metabolic panel  Tomorrow morning,   R        12/07/22 0854   12/08/22 0500  CBC  Tomorrow morning,   R        12/07/22 0854   12/07/22 0044  Blood culture (routine x 2)  BLOOD CULTURE X 2,   R      12/07/22 0045   12/07/22 0044  Urine Culture  Once,   URGENT       Question:  Indication  Answer:  Suprapubic pain   12/07/22 0045            Vitals/Pain Today's Vitals   12/07/22 0532 12/07/22 0600 12/07/22 0630 12/07/22 0845  BP:  (!) 142/75 (!) 140/73   Pulse:  74 72   Resp:  13 12   Temp: 97.8 F (36.6 C)   97.9 F (36.6 C)  TempSrc: Oral   Oral  SpO2:  99% 97%   Weight:      PainSc:        Isolation Precautions No active isolations  Medications Medications  aspirin chewable tablet 81 mg (has no administration in time range)  metoprolol succinate (TOPROL-XL) 24 hr tablet 25 mg (has no administration in time range)  ALPRAZolam (XANAX) tablet 1 mg (has no administration in time range)  escitalopram (LEXAPRO) tablet 20 mg (has no administration in time range)  tamsulosin (FLOMAX)  capsule 0.4 mg (has no administration in time range)  pantoprazole (PROTONIX) injection 40 mg (has no administration in time range)  lactated ringers infusion (has no administration in time range)  acetaminophen (TYLENOL) tablet 650 mg (has no administration in time range)    Or  acetaminophen (TYLENOL) suppository 650 mg (has no administration in time range)  morphine (PF) 2 MG/ML injection 2 mg (has no administration in time range)  ondansetron (ZOFRAN) tablet 4 mg (has no administration in time range)    Or  ondansetron (ZOFRAN) injection 4 mg (has  no administration in time range)  hydrALAZINE (APRESOLINE) injection 5 mg (has no administration in time range)  cefTRIAXone (ROCEPHIN) 2 g in sodium chloride 0.9 % 100 mL IVPB (has no administration in time range)  metroNIDAZOLE (FLAGYL) IVPB 500 mg (has no administration in time range)  ondansetron (ZOFRAN-ODT) disintegrating tablet 4 mg (4 mg Oral Given 12/07/22 0102)  lactated ringers bolus 1,000 mL (1,000 mLs Intravenous New Bag/Given 12/07/22 0529)  cefTRIAXone (ROCEPHIN) 2 g in sodium chloride 0.9 % 100 mL IVPB (0 g Intravenous Stopped 12/07/22 0950)    Mobility Has not walked yet therefore, I have not assessed   Focused Assessments    R Recommendations: See Admitting Provider Note  Report given to:   Additional Notes:

## 2022-12-07 NOTE — Consult Note (Addendum)
[facility] Consult: 9:35 AM 12/07/2022  LOS: 0 days    Referring Provider: Dr Thomasenia Bottoms  Primary Care Physician:  [name], FNP Primary Gastroenterologist: Dr Henrene Pastor    Reason for Consultation:  Dysphagia, elevated LFTs,    HPI: Francisco [name] is a 82 y.o. male.  Hx htn.  HLD.  CKD 3a.  Adenomatous colon polyps 2006, 2011.  04/2022 gram-negative bacteremia.  Chronic constipation.  Anemia, Hb 11.2 in late August 2023.  08/2022 cervical spine fusion/fixation after fall, C4 fx, ruptured cervical ligament.  08/2005 colonoscopy with colon polyps (adenomas) and diverticulosis 07/2010 colonoscopy severe diverticulosis throughout colon.  Pedunculated polyp (TA) at mid transverse colon removed.   07/05/2022 Laparoscopic, subtotal cholecystectomy, drain placement.   07/09/2022 ERCP with sphincterotomy, biliary stent placement to address postop bile leak.  The limited esophageal visualization during ERCP demonstrated a mild, nonobstructing, distal esophageal stricture but was otherwise normal. No follow-up with GI since August.  Describes malaise, FTT, anorexia, 40 #wt loss since 06/2022.  C spine surgery precipitated by syncope, fall in tub.  "Years" of liquid and solid dysphagia, progressive.  Feels substances sticking at level of upper cervical esophagus.  Sometimes chokes on saliva.  Belches a lot.  Sometimes regurgitate/vomits up liquids and solids but no events of persistent impaction.  Regurgitant is clear or white/foamy.  Brown stools 2-3 times a week, last BM was 11/2019, Saturday.  Denies abdominal pain.  Endorses small, mid abdominal hernia. Denies use of APAP other than rare, occasional use.    Normotensive.  Afebrile.  Normal heart rate.  Excellent room air sats. T. bili 3.9.  Alk phos 411.  AST/ALT  90/130. GFR 35.  Lactate normal. WBC 15.8.  Hgb 12.2.  MCV 97.  Platelets 246. Urinalysis positive for nitrites, large bilirubin.  Negative for leukocytes.  Proteinuria and ketones present.  Urine culture pending. RUQ ultrasound: 7 mm bile duct.  PV Dopplers normal.  Surgically absent gallbladder.  No stent visualized.  Widower since 2018.  His home is in [facility] outside of [place].  Since return home in August, both of his daughters alternate staying with him for a week at a time.  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  No smoking/tobacco.  Past Medical History:  Diagnosis Date   Anxiety    GERD (gastroesophageal reflux disease)    Hypertension    Vertigo     Past Surgical History:  Procedure Laterality Date   AMPUTATION Left 08/05/2016   Procedure: REVISION AMPUTATION LEFT INDEX FINGER REPAIR LACERATION LEFT THUMB;  Surgeon: [name], MD;  Location: [facility];  Service: Orthopedics;  Laterality: Left;   BILIARY STENT PLACEMENT N/A 07/09/2022   Procedure: BILIARY STENT PLACEMENT;  Surgeon: Ladene Artist, MD;  Location: [facility];  Service: Gastroenterology;  Laterality: N/A;   CHOLECYSTECTOMY N/A 07/05/2022   Procedure: LAPAROSCOPIC CHOLECYSTECTOMY;  Surgeon: Dwan Bolt, MD;  Location: [facility];  Service: General;  Laterality: N/A;   ERCP N/A 07/09/2022   Procedure: ENDOSCOPIC  RETROGRADE CHOLANGIOPANCREATOGRAPHY (ERCP);  Surgeon: Ladene Artist, MD;  Location: [facility];  Service: Gastroenterology;  Laterality: N/A;   POSTERIOR CERVICAL FUSION/FORAMINOTOMY N/A 08/18/2022   Procedure: C4-5 Posterior cervical fusion with lateral mass fixation;  Surgeon: Ashok Pall, MD;  Location: [facility];  Service: Neurosurgery;  Laterality: N/A;   SPHINCTEROTOMY  07/09/2022   Procedure: SPHINCTEROTOMY;  Surgeon: Ladene Artist, MD;  Location: [facility];  Service: Gastroenterology;;   SPINE SURGERY      Prior to  Admission medications   Medication Sig Start Date End Date Taking? Authorizing Provider  ALPRAZolam (XANAX) 1 MG tablet TAKE 1 TABLET BY MOUTH TWICE DAILY AS NEEDED FOR ANXIETY OR SLEEP Patient taking differently: Take 1 mg by mouth 2 (two) times daily as needed for sleep or anxiety. 11/17/22  Yes Hassell Done, Mary-Margaret, FNP  aspirin 81 MG tablet Take 81 mg by mouth daily.   Yes [provider]  escitalopram (LEXAPRO) 10 MG tablet Take 2 tablets (20 mg total) by mouth daily. 08/14/22  Yes Martin, Mary-Margaret, FNP  furosemide (LASIX) 20 MG tablet Take 1 tablet (20 mg total) by mouth daily. 08/14/22  Yes Hassell Done, Mary-Margaret, FNP  meclizine (ANTIVERT) 25 MG tablet Take 1 tablet (25 mg total) by mouth 2 (two) times daily. 09/22/22  Yes Hassell Done, Mary-Margaret, FNP  metoprolol succinate (TOPROL-XL) 25 MG 24 hr tablet TAKE 1 TABLET BY MOUTH TWICE  DAILY 07/13/22  Yes Park Liter, MD  pantoprazole (PROTONIX) 20 MG tablet TAKE 1 TABLET BY MOUTH DAILY 09/24/22  Yes Hassell Done, Mary-Margaret, FNP  polyethylene glycol (MIRALAX / GLYCOLAX) 17 g packet Take 17 g by mouth 2 (two) times daily. Patient taking differently: Take 17 g by mouth 2 (two) times daily as needed for mild constipation or moderate constipation. 07/14/22  Yes Meuth, Brooke A, PA-C  simvastatin (ZOCOR) 80 MG tablet Take 1 tablet (80 mg total) by mouth daily. 08/14/22  Yes Hassell Done, Mary-Margaret, FNP  tamsulosin (FLOMAX) 0.4 MG CAPS capsule Take 1 capsule (0.4 mg total) by mouth daily. 08/14/22  Yes Hassell Done, Mary-Margaret, FNP  acetaminophen (TYLENOL) 325 MG tablet Take 650 mg by mouth every 6 (six) hours as needed for mild pain or fever.    [provider]    Scheduled Meds:  [START ON 12/08/2022] aspirin  81 mg Oral Daily   escitalopram  20 mg Oral Daily   metoprolol succinate  25 mg Oral BID   pantoprazole (PROTONIX) IV  40 mg Intravenous Q24H   tamsulosin  0.4 mg Oral Daily   Infusions:  lactated ringers     PRN  Meds: acetaminophen **OR** acetaminophen, ALPRAZolam, hydrALAZINE, morphine injection, ondansetron **OR** ondansetron (ZOFRAN) IV   Allergies as of 12/07/2022 - Review Complete 12/07/2022  Allergen Reaction Noted   Crestor [rosuvastatin] Other (See Comments) 04/12/2013   Lipitor [atorvastatin] Other (See Comments) 04/12/2013   Penicillins  07/15/2010   Symbicort [budesonide-formoterol fumarate] Other (See Comments) 03/25/2015    Family History  Problem Relation Age of Onset   Healthy Mother    Transient ischemic attack Father    Rheum arthritis Daughter    Amblyopia Neg Hx    Blindness Neg Hx    Cataracts Neg Hx    Glaucoma Neg Hx    Macular degeneration Neg Hx    Retinal detachment Neg Hx    Strabismus Neg Hx    Retinitis pigmentosa Neg Hx     Social History   Socioeconomic History   Marital status: Widowed    Spouse  name: Not on file   Number of children: 2   Years of education: Not on file   Highest education level: Not on file  Occupational History   Occupation: Retired    Fish farm manager: [employer]  Tobacco Use   Smoking status: Former    Packs/day: 1.00    Years: 15.00    Total pack years: 15.00    Types: Cigarettes    Quit date: 02/23/1972    Years since quitting: 50.8   Smokeless tobacco: Never  Vaping Use   Vaping Use: Never used  Substance and Sexual Activity   Alcohol use: Yes    Alcohol/week: 1.0 standard drink of alcohol    Types: 1 Standard drinks or equivalent per week    Comment: occasional beer, very  little   Drug use: No   Sexual activity: Not Currently  Other Topics Concern   Not on file  Social History Narrative   2 daughters nearby   Social Determinants of Health   Financial Resource Strain: Low Risk  (08/20/2022)   Overall Financial Resource Strain (CARDIA)    Difficulty of Paying Living Expenses: Not hard at all  Food Insecurity: No Food Insecurity (08/08/2021)   Hunger Vital Sign    Worried About Running Out of Food in the Last Year:  Never true    Ran Out of Food in the Last Year: Never true  Transportation Needs: No Transportation Needs (08/20/2022)   PRAPARE - Hydrologist (Medical): No    Lack of Transportation (Non-Medical): No  Physical Activity: Inactive (08/08/2021)   Exercise Vital Sign    Days of Exercise per Week: 0 days    Minutes of Exercise per Session: 0 min  Stress: No Stress Concern Present (08/08/2021)   [place]    Feeling of Stress : Not at all  Social Connections: Socially Isolated (08/08/2021)   Social Connection and Isolation Panel [NHANES]    Frequency of Communication with Friends and Family: More than three times a week    Frequency of Social Gatherings with Friends and Family: More than three times a week    Attends Religious Services: Never    Marine scientist or Organizations: No    Attends Archivist Meetings: Never    Marital Status: Widowed  Intimate Partner Violence: Not At Risk (08/08/2021)   Humiliation, Afraid, Rape, and Kick questionnaire    Fear of Current or Ex-Partner: No    Emotionally Abused: No    Physically Abused: No    Sexually Abused: No    REVIEW OF SYSTEMS: Constitutional: Weakness, fatigue. ENT:  No nose bleeds Pulm: Cough productive of clear and white foamy sputum. CV:  No palpitations, no LE edema.  No angina.  No tachycardia. GU:  No hematuria, no frequency GI: See HPI. Heme: Other than purpura on forearms, no unusual bruising or excessive bleeding. Transfusions: No prior transfusion with blood products. Neuro:  No headaches, no peripheral tingling or numbness.  No seizures, no syncope, no memory lapses. Derm:  No itching, no rash or sores.  Endocrine:  No sweats or chills.  No polyuria or dysuria Immunization: Reviewed. Travel: Not queried.   PHYSICAL EXAM: Vital signs in last 24 hours: Vitals:   12/07/22 0600 12/07/22 0630  BP: (!) 142/75  (!) 140/73  Pulse: 74 72  Resp: 13 12  Temp:    SpO2: 99% 97%   Wt Readings from Last 3  Encounters:  12/07/22 77.1 kg  08/18/22 81.6 kg  08/12/22 81.6 kg    General: Patient is frail, aged.  Resting comfortably in bed.  Head: No facial asymmetry or swelling.  No signs of head trauma. Eyes: ? Subtle scleral icterus.  No conjunctival pallor Ears: No obvious hearing loss. Nose: No congestion or discharge Mouth: Tongue midline.  Mucosa pink, moist, clear.  Poor dentition but most native teeth intact. Neck: No JVD, no masses, no thyromegaly Lungs: Clear.  No labored breathing or cough Heart: RRR.  No MRG.  S1, S2 present. Abdomen: Soft without tenderness or distention.  Active bowel sounds.  Small hernia mid abdomen apparent with muscular engagement but not at rest..   Rectal: Deferred. Musc/Skeltl: No joint redness, swelling or gross deformity. Extremities: No CCE. Neurologic: Alert.  Appropriate.  Oriented x 3.  Moves all 4 limbs without tremor or gross weakness.  Formal strength testing not pursued. Skin: No rash, no sores, no telangiectasia. Nodes: No cervical adenopathy Psych: Slightly blunted affect but pleasant, engaged.  Intake/Output from previous day: No intake/output data recorded. Intake/Output this shift: No intake/output data recorded.  LAB RESULTS: Recent Labs    12/07/22 0111  WBC 15.8*  HGB 12.2*  HCT 37.5*  PLT 246   BMET Lab Results  Component Value Date   NA 136 12/07/2022   NA 139 08/14/2022   NA 142 07/17/2022   K 4.0 12/07/2022   K 4.3 08/14/2022   K 4.5 07/17/2022   CL 100 12/07/2022   CL 100 08/14/2022   CL 105 07/17/2022   CO2 24 12/07/2022   CO2 25 08/14/2022   CO2 24 07/17/2022   GLUCOSE 218 (H) 12/07/2022   GLUCOSE 114 (H) 08/14/2022   GLUCOSE 143 (H) 07/17/2022   BUN 20 12/07/2022   BUN 20 08/14/2022   BUN 13 07/17/2022   CREATININE 1.88 (H) 12/07/2022   CREATININE 1.20 08/14/2022   CREATININE 1.10 07/17/2022   CALCIUM 8.9  12/07/2022   CALCIUM 8.9 08/14/2022   CALCIUM 8.4 (L) 07/17/2022   LFT Recent Labs    12/07/22 0111  PROT 6.6  ALBUMIN 3.2*  AST 90*  ALT 130*  ALKPHOS 411*  BILITOT 3.9*   PT/INR Lab Results  Component Value Date   INR 1.2 07/05/2022   INR 1.4 (H) 05/06/2022   INR 1.1 05/05/2022   Hepatitis Panel No results for input(s): "HEPBSAG", "HCVAB", "HEPAIGM", "HEPBIGM" in the last 72 hours. C-Diff No components found for: "CDIFF" Lipase     Component Value Date/Time   LIPASE 32 07/10/2022 0445    Drugs of Abuse  No results found for: "LABOPIA", "COCAINSCRNUR", "LABBENZ", "AMPHETMU", "THCU", "LABBARB"   RADIOLOGY STUDIES: US Abdomen Limited  Result Date: 12/07/2022 CLINICAL DATA:  Elevated liver function tests. History of biliary stent EXAM: ULTRASOUND ABDOMEN LIMITED RIGHT UPPER QUADRANT COMPARISON:  Abdominal CT 07/12/2022 FINDINGS: Gallbladder: Surgically absent Common bile duct: Diameter: 7 mm.  Where visualized, no filling defect. Liver: No focal lesion identified. Within normal limits in parenchymal echogenicity. Portal vein is patent on color Doppler imaging with normal direction of blood flow towards the liver. IMPRESSION: Cholecystectomy without biliary dilatation. Electronically Signed   By: Jorje Guild M.D.   On: 12/07/2022 05:45   DG Chest 2 View  Result Date: 12/07/2022 CLINICAL DATA:  Fever. EXAM: CHEST - 2 VIEW COMPARISON:  July 10, 2022 FINDINGS: The heart size and mediastinal contours are within normal limits. There is moderate severity calcification of the aortic arch. Both  lungs are clear. A subcentimeter radiopaque soft tissue foreign body is seen within the anterior aspect of the right shoulder. A radiopaque fusion plate and screws are seen overlying the lower cervical spine. Multilevel degenerative changes are noted throughout the thoracic spine. IMPRESSION: No active cardiopulmonary disease. Electronically Signed   By: Virgina Norfolk M.D.   On:  12/07/2022 01:38      IMPRESSION:   FTT since subtotal cholecystectomy, bile leak requiring biliary stent placement in August 2023  Dysphagia to solids and liquids.  Rule out dysmotility.  The limited esophageal visualization during ERCP demonstrated a mild, nonobstructing, distal esophageal stricture but was otherwise normal.    Elevated LFTs.  Particularly elevated Alk phos.  Subtotal cholecystectomy and ERCP biliary stent placement for post op bile leak 06/2022.  Stent not present on ultrasound today.    Leukocytosis.  ? UTI.  Got Rocephin today.  Urine and blood clx pndg.      AKI.    Adenomatous colon polyps 2006, 2011  PLAN:       Ordered esophagram.  Allow clears for now, adjust diet based on egram.  Likely EGD tomorrow, orders placed.  May need SLP swallow eval.      Follow LFTs.  ? Need for CT imaging.  Zocor on hold.     Azucena Freed  12/07/2022, 9:35 AM Phone [phone]   Attending physician's note  I have taken a history, reviewed the chart and examined the patient. I performed a substantive portion of this encounter, including complete performance of at least one of the key components, in conjunction with the APP. I agree with the APP's note, impression and recommendations.    33 yr M s/p cervical spine fusion in 08/2022 after C4 fracture and ruptures cervical ligament with c/o worsening both solid and liquid dysphagia, failure to thrive  Will plan to proceed with EGD to exclude peptic stricture or erosive esophagitis. If negative will plan for barium esophagogram to evaluate for any significant esophageal dysmotility or extrinsic compression/osteophyte from C-spine  NPO after midnight  Elevated Bili, alk phose and transaminases RUQ ultrasound negative for CBD dilation or sludge. S/p cholecystetctomy  F/u hepatitis panel  ?Drug induced liver injury  On ceftriaxone for UTI, has mild leucocytosis  Monitor daily LFT  If bili continues to rise,  will plan to  proceed with MRCP to exclude biliary sludge/obstruction   The patient was provided an opportunity to ask questions and all were answered. The patient agreed with the plan and demonstrated an understanding of the instructions.  Damaris Hippo , MD [phone]

## 2022-12-07 NOTE — ED Provider Notes (Signed)
[place] Provider Note   CSN: [id number] Arrival date & time: 12/07/22  0041     History  Chief Complaint  Patient presents with   Weakness    Francisco [name] is a 82 y.o. male.  The history is provided by the patient.  Weakness He has history of hypertension, GERD, biliary stent and comes in because of concern for possible sepsis.  He had been admitted to the hospital last year for sepsis.  Tonight, he had chills and subjective fever and sweats.  He has had a slight cough which is productive of some clear sputum.  He has had nausea and vomiting but no diarrhea.  He denies any urinary urgency, frequency, tenesmus, dysuria.  He is feeling generally weak.   Home Medications Prior to Admission medications   Medication Sig Start Date End Date Taking? Authorizing Provider  ALPRAZolam (XANAX) 1 MG tablet TAKE 1 TABLET BY MOUTH TWICE DAILY AS NEEDED FOR ANXIETY OR SLEEP Patient taking differently: Take 1 mg by mouth 2 (two) times daily as needed for sleep or anxiety. 11/17/22  Yes Hassell Done, Mary-Margaret, FNP  aspirin 81 MG tablet Take 81 mg by mouth daily.   Yes [provider]  escitalopram (LEXAPRO) 10 MG tablet Take 2 tablets (20 mg total) by mouth daily. 08/14/22  Yes Martin, Mary-Margaret, FNP  furosemide (LASIX) 20 MG tablet Take 1 tablet (20 mg total) by mouth daily. 08/14/22  Yes Hassell Done, Mary-Margaret, FNP  meclizine (ANTIVERT) 25 MG tablet Take 1 tablet (25 mg total) by mouth 2 (two) times daily. 09/22/22  Yes Hassell Done, Mary-Margaret, FNP  metoprolol succinate (TOPROL-XL) 25 MG 24 hr tablet TAKE 1 TABLET BY MOUTH TWICE  DAILY 07/13/22  Yes Park Liter, MD  pantoprazole (PROTONIX) 20 MG tablet TAKE 1 TABLET BY MOUTH DAILY 09/24/22  Yes Hassell Done, Mary-Margaret, FNP  polyethylene glycol (MIRALAX / GLYCOLAX) 17 g packet Take 17 g by mouth 2 (two) times daily. Patient taking differently: Take 17 g by mouth 2 (two) times daily as needed  for mild constipation or moderate constipation. 07/14/22  Yes Meuth, Brooke A, PA-C  simvastatin (ZOCOR) 80 MG tablet Take 1 tablet (80 mg total) by mouth daily. 08/14/22  Yes Hassell Done, Mary-Margaret, FNP  tamsulosin (FLOMAX) 0.4 MG CAPS capsule Take 1 capsule (0.4 mg total) by mouth daily. 08/14/22  Yes Hassell Done, Mary-Margaret, FNP  acetaminophen (TYLENOL) 325 MG tablet Take 650 mg by mouth every 6 (six) hours as needed for mild pain or fever.    [provider]  docusate sodium (COLACE) 100 MG capsule Take 1 capsule (100 mg total) by mouth 2 (two) times daily. Patient not taking: Reported on 12/07/2022 07/14/22   Margie Billet A, PA-C  fish oil-omega-3 fatty acids 1000 MG capsule Take 2 g by mouth daily. Patient not taking: Reported on 12/07/2022    [provider]      Allergies    Crestor [rosuvastatin], Lipitor [atorvastatin], Penicillins, and Symbicort [budesonide-formoterol fumarate]    Review of Systems   Review of Systems  Neurological:  Positive for weakness.  All other systems reviewed and are negative.   Physical Exam Updated Vital Signs BP 135/69   Pulse 73   Temp 97.9 F (36.6 C)   Resp 14   Wt 77.1 kg   SpO2 98%   BMI 25.10 kg/m  Physical Exam Vitals and nursing note reviewed.   82 year old male, resting comfortably and in no acute distress. Vital  signs are normal. Oxygen saturation is 98%, which is normal. Head is normocephalic and atraumatic. PERRLA, EOMI. Oropharynx is clear. Neck is nontender and supple without adenopathy or JVD. Back is nontender and there is no CVA tenderness. Lungs are clear without rales, wheezes, or rhonchi. Chest is nontender. Heart has regular rate and rhythm without murmur. Abdomen is soft, flat, nontender.  Midline ventral hernia is present superior to the umbilicus. Extremities have no cyanosis or edema, full range of motion is present. Skin is warm and dry without rash. Neurologic: Mental status is normal, cranial  nerves are intact, moves all extremities equally.  ED Results / Procedures / Treatments   Labs (all labs ordered are listed, but only abnormal results are displayed) Labs Reviewed  COMPREHENSIVE METABOLIC PANEL - Abnormal; Notable for the following components:      Result Value   Glucose, Bld 218 (*)    Creatinine, Ser 1.88 (*)    Albumin 3.2 (*)    AST 90 (*)    ALT 130 (*)    Alkaline Phosphatase 411 (*)    Total Bilirubin 3.9 (*)    GFR, Estimated 35 (*)    All other components within normal limits  CBC WITH DIFFERENTIAL/PLATELET - Abnormal; Notable for the following components:   WBC 15.8 (*)    RBC 3.84 (*)    Hemoglobin 12.2 (*)    HCT 37.5 (*)    Neutro Abs 14.8 (*)    Lymphs Abs 0.3 (*)    All other components within normal limits  URINALYSIS, ROUTINE W REFLEX MICROSCOPIC - Abnormal; Notable for the following components:   Color, Urine AMBER (*)    Glucose, UA 100 (*)    Bilirubin Urine LARGE (*)    Ketones, ur 40 (*)    Protein, ur 30 (*)    Nitrite POSITIVE (*)    All other components within normal limits  URINALYSIS, MICROSCOPIC (REFLEX) - Abnormal; Notable for the following components:   Bacteria, UA FEW (*)    All other components within normal limits  RESP PANEL BY RT-PCR (RSV, FLU A&B, COVID)  RVPGX2  CULTURE, BLOOD (ROUTINE X 2)  CULTURE, BLOOD (ROUTINE X 2)  URINE CULTURE  LACTIC ACID, PLASMA    EKG EKG Interpretation  Date/Time:  Monday December 07 2022 00:33:36 EST Ventricular Rate:  88 PR Interval:  140 QRS Duration: 72 QT Interval:  388 QTC Calculation: 469 R Axis:   67 Text Interpretation: Normal sinus rhythm Normal ECG When compared with ECG of 05-Jul-2022 11:16, HEART RATE has increased Confirmed by Delora Fuel ([id number]) on 12/07/2022 2:38:54 AM  Radiology US Abdomen Limited  Result Date: 12/07/2022 CLINICAL DATA:  Elevated liver function tests. History of biliary stent EXAM: ULTRASOUND ABDOMEN LIMITED RIGHT UPPER QUADRANT COMPARISON:   Abdominal CT 07/12/2022 FINDINGS: Gallbladder: Surgically absent Common bile duct: Diameter: 7 mm.  Where visualized, no filling defect. Liver: No focal lesion identified. Within normal limits in parenchymal echogenicity. Portal vein is patent on color Doppler imaging with normal direction of blood flow towards the liver. IMPRESSION: Cholecystectomy without biliary dilatation. Electronically Signed   By: Jorje Guild M.D.   On: 12/07/2022 05:45   DG Chest 2 View  Result Date: 12/07/2022 CLINICAL DATA:  Fever. EXAM: CHEST - 2 VIEW COMPARISON:  July 10, 2022 FINDINGS: The heart size and mediastinal contours are within normal limits. There is moderate severity calcification of the aortic arch. Both lungs are clear. A subcentimeter radiopaque soft tissue foreign body is seen  within the anterior aspect of the right shoulder. A radiopaque fusion plate and screws are seen overlying the lower cervical spine. Multilevel degenerative changes are noted throughout the thoracic spine. IMPRESSION: No active cardiopulmonary disease. Electronically Signed   By: Virgina Norfolk M.D.   On: 12/07/2022 01:38    Procedures Procedures  Cardiac monitor shows normal sinus rhythm, per my interpretation.  Medications Ordered in ED Medications  ondansetron (ZOFRAN-ODT) disintegrating tablet 4 mg (4 mg Oral Given 12/07/22 0102)  lactated ringers bolus 1,000 mL (1,000 mLs Intravenous New Bag/Given 12/07/22 0529)  cefTRIAXone (ROCEPHIN) 2 g in sodium chloride 0.9 % 100 mL IVPB (2 g Intravenous New Bag/Given 12/07/22 0529)    ED Course/ Medical Decision Making/ A&P                             Medical Decision Making Amount and/or Complexity of Data Reviewed Radiology: ordered.   Subjective fever, chills, vomiting concerning for infection such as influenza, COVID-19, RSV.  Consider pneumonia.  I have reviewed his past records, and he was admitted on 05/05/2022 for E. coli sepsis but urine culture was negative.  He was  admitted on 07/05/2022 for cholelithiasis at which time biliary stent was placed for concern for bile leak.  Chest x-ray today shows no evidence of pneumonia.  I have independently viewed the images, and agree with radiologist's interpretation.  I have reviewed and interpreted his laboratory tests, and my interpretation is elevated random glucose, elevated creatinine indicating acute kidney injury with creatinine 1.88 compared with 1.20 on 08/14/2022, elevated transaminases and alkaline phosphatase which are new compared with 08/14/2022, elevated total bilirubin which is also new compared with 08/14/2022.  Moderate leukocytosis is present with left shift, mild anemia present not significantly changed from baseline, respiratory pathogen panel negative for influenza, COVID-19, RSV, urinalysis significant for positive nitrite and also mild ketonuria.  Lactic acid level is normal, no evidence of sepsis.  I have ordered IV fluids and IV ceftriaxone for urinary tract infection.  Elevated liver enzymes and bilirubin raises concern for occlusion of his biliary stent, but abdominal exam is completely benign.  I have ordered a right upper quadrant ultrasound.  He will need to be admitted, I am paging hospitalist for admission.  I have discussed case with Dr. Marlowe Sax of [facility] hospitalist who states that she wants the ultrasound report to be back before she can come to evaluate the patient.  Right upper quadrant ultrasound is negative.  Common bile duct is normal size without any filling defects, no dilated intrahepatic ducts.  I have independently viewed the images, and agree with radiologist's interpretation.  I have sent a secure text to Dr. Marlowe Sax, who will arrange for hospital admission.  I have also sent a secure text to Dr. Tarri Glenn, on-call for gastroenterology asking her to see the patient in consultation.  Final Clinical Impression(s) / ED Diagnoses Final diagnoses:  Acute kidney injury (nontraumatic) (HCC)   Urinary tract infection without hematuria, site unspecified  Elevated liver function tests  Total bilirubin, elevated  Elevated random blood glucose level  Normochromic normocytic anemia    Rx / DC Orders ED Discharge Orders     None         Delora Fuel, MD 15/72/62 [phone]

## 2022-12-07 NOTE — Plan of Care (Signed)
  Problem: Education: Goal: Knowledge of General Education information will improve Description: Including pain rating scale, medication(s)/side effects and non-pharmacologic comfort measures Outcome: Progressing   Problem: Pain Managment: Goal: General experience of comfort will improve Outcome: Progressing

## 2022-12-07 NOTE — Evaluation (Signed)
Occupational Therapy Evaluation Patient Details Name: Francisco [name] MRN: [id number] DOB: 21-May-1941 Today's Date: 12/07/2022   History of Present Illness Patient is 82 y.o. male presenting with weakness and c/o difficulty swallowing as well as emesis and rigors... PMH significant of HTN, HLD, stage 3a CKD, and BPH. Recently he was last admitted from 10/3-4 with C4 cervical fracture s/p fusion. Pt reports decline since fusion and difficulty eating/swallowing. Prior to that, he was admitted in June for E coli bacteremia and from 8/20-29 with acute on chronic cholecystitis requiring a laparascopic subtotal fenestrating cholecystectomy with drain placement.   Clinical Impression   Patient admitted for the diagnosis above.  PTA he remains at home with the assist of his two daughters'.  Daughter's assist with socks/shoes, meals, iADL, and community mobility.  Patient wants to return home initially, but is beginning to consider ALF for the future.  Currently he is needing Min A for lower body ADL, and general Min Guard for in room mobility.  OT to follow in the acute setting to address deficits listed below, and assist with eventual transition back home.        Recommendations for follow up therapy are one component of a multi-disciplinary discharge planning process, led by the attending physician.  Recommendations may be updated based on patient status, additional functional criteria and insurance authorization.   Follow Up Recommendations  No OT follow up     Assistance Recommended at Discharge Intermittent Supervision/Assistance  Patient can return home with the following Assist for transportation;Assistance with cooking/housework    Functional Status Assessment  Patient has had a recent decline in their functional status and demonstrates the ability to make significant improvements in function in a reasonable and predictable amount of time.  Equipment Recommendations  None recommended by OT     Recommendations for Other Services       Precautions / Restrictions Precautions Precautions: Fall Restrictions Weight Bearing Restrictions: No      Mobility Bed Mobility Overal bed mobility: Needs Assistance Bed Mobility: Supine to Sit, Sit to Supine     Supine to sit: Supervision, HOB elevated Sit to supine: Supervision   General bed mobility comments: cued for log roll, and patient reaching for side rails.    Transfers Overall transfer level: Needs assistance Equipment used: Rolling walker (2 wheels) Transfers: Sit to/from Stand Sit to Stand: Supervision, Min guard                  Balance Overall balance assessment: Needs assistance Sitting-balance support: Feet supported, Bilateral upper extremity supported Sitting balance-Leahy Scale: Good     Standing balance support: Reliant on assistive device for balance Standing balance-Leahy Scale: Poor                             ADL either performed or assessed with clinical judgement   ADL       Grooming: Wash/dry hands;Wash/dry face;Min guard;Standing           Upper Body Dressing : Set up;Sitting   Lower Body Dressing: Minimal assistance;Sit to/from stand   Toilet Transfer: Min guard;Rolling walker (2 wheels);Regular Toilet                   Vision Baseline Vision/History: 1 Wears glasses Patient Visual Report: No change from baseline       Perception     Praxis      Pertinent Vitals/Pain Pain Assessment Pain Assessment: No/denies  pain     Hand Dominance Right   Extremity/Trunk Assessment Upper Extremity Assessment Upper Extremity Assessment: Overall WFL for tasks assessed   Lower Extremity Assessment Lower Extremity Assessment: Defer to PT evaluation   Cervical / Trunk Assessment Cervical / Trunk Assessment: Normal   Communication Communication Communication: No difficulties   Cognition Arousal/Alertness: Awake/alert Behavior During Therapy: WFL for  tasks assessed/performed Overall Cognitive Status: Within Functional Limits for tasks assessed                                       General Comments   VSS on RA    Exercises     Shoulder Instructions      Home Living Family/patient expects to be discharged to:: Private residence Living Arrangements: Alone Available Help at Discharge: Family;Available 24 hours/day Type of Home: House Home Access: Ramped entrance     Home Layout: One level     Bathroom Shower/Tub: Teacher, early years/pre: Handicapped height Bathroom Accessibility: Yes How Accessible: Accessible via walker Home Equipment: [place] (2 wheels);Cane - single point;Shower seat;BSC/3in1;Wheelchair - manual;Grab bars - tub/shower   Additional Comments: Pt states youngest dtr will be staying with him and older dtr is available near by      Prior Functioning/Environment Prior Level of Function : Independent/Modified Independent;Driving             Mobility Comments: Recently using RW after fall ADLs Comments: daughter's were assisting with ADL and iADL after fall, primarily shoes/socks, heavy home management and meals.        OT Problem List: Decreased strength;Decreased activity tolerance;Impaired balance (sitting and/or standing)      OT Treatment/Interventions: Self-care/ADL training;Therapeutic activities;DME and/or AE instruction;Patient/family education;Balance training    OT Goals(Current goals can be found in the care plan section) Acute Rehab OT Goals Patient Stated Goal: Not sure, thinking about ALF OT Goal Formulation: With patient Time For Goal Achievement: 12/21/22 Potential to Achieve Goals: Good ADL Goals Pt Will Perform Grooming: with modified independence;standing Pt Will Perform Lower Body Dressing: with modified independence;sit to/from stand Pt Will Transfer to Toilet: with modified independence;ambulating;regular height toilet  OT Frequency: Min  2X/week    Co-evaluation              AM-PAC OT "6 Clicks" Daily Activity     Outcome Measure Help from another person eating meals?: None Help from another person taking care of personal grooming?: A Little Help from another person toileting, which includes using toliet, bedpan, or urinal?: A Little Help from another person bathing (including washing, rinsing, drying)?: A Little Help from another person to put on and taking off regular upper body clothing?: None Help from another person to put on and taking off regular lower body clothing?: A Little 6 Click Score: 20   End of Session Nurse Communication: Mobility status  Activity Tolerance: Patient tolerated treatment well Patient left: in bed;with call bell/phone within reach  OT Visit Diagnosis: Unsteadiness on feet (R26.81);Muscle weakness (generalized) (M62.81)                Time: 0102-7253 OT Time Calculation (min): 19 min Charges:  OT General Charges $OT Visit: 1 Visit OT Evaluation $OT Eval Moderate Complexity: 1 Mod  12/07/2022  RP, OTR/L  Acute Rehabilitation Services  Office:  [phone]   Metta Clines 12/07/2022, 2:33 PM

## 2022-12-07 NOTE — ED Provider Triage Note (Signed)
Emergency Medicine Provider Triage Evaluation Note  Francisco [name] , a 82 y.o. male  was evaluated in triage.  Pt complains of dark urine, fatigue, generalized weakness states that he has not been eating or drinking as well for the past few days because he has been so tired.  He lives by himself states he is normally able to get around and do his activities of daily living.  He states his urine has been dark-colored.  No history of UTIs and does not indicate any urinary symptoms.  Has been coughing some lately.    Review of Systems  Positive: Cough, Weakness, fatigue Negative: Chest pain  Physical Exam  There were no vitals taken for this visit. Gen:   Awake, no distress   Resp:  Normal effort  MSK:   Moves extremities without difficulty  Other:    Medical Decision Making  Medically screening exam initiated at 12:46 AM.  Appropriate orders placed.  Kinley L Lheureux was informed that the remainder of the evaluation will be completed by another provider, this initial triage assessment does not replace that evaluation, and the importance of remaining in the ED until their evaluation is complete.  Patient appears euvolemic will order labs COVID influenza and RSV given patient's symptoms.  Per EMS patient had temperature of 100.2 will therefore get lact/cultures   Tedd Sias, [place] 12/07/22 [phone]

## 2022-12-07 NOTE — Progress Notes (Addendum)
PHARMACY - PHYSICIAN COMMUNICATION CRITICAL VALUE ALERT - BLOOD CULTURE IDENTIFICATION (BCID)  Ousmane L Stock is an 82 y.o. male who presented to [facility] on 12/07/2022 with a chief complaint of weakness  Assessment:  37 YOM with weakness/vomiting/dysphagia. Recently noted to have E.coli bacteremia with cholecystitis requiring cholecystectomy and drain placement - gallbladder removed. Concerned for intra-abd infection now with 2 of 4 blood cultures growing GNR with BCID detecting Klebsiella PNA - no resistance mechanisms detected.   Name of physician (or Provider) Contacted: Yates  Current antibiotics: Rocephin + Flagyl  Changes to prescribed antibiotics recommended:  Patient is on recommended antibiotics - No changes needed  Results for orders placed or performed during the hospital encounter of 05/05/22  Blood Culture ID Panel (Reflexed) (Collected: 05/05/2022  5:35 PM)  Result Value Ref Range   Enterococcus faecalis NOT DETECTED NOT DETECTED   Enterococcus Faecium NOT DETECTED NOT DETECTED   Listeria monocytogenes NOT DETECTED NOT DETECTED   Staphylococcus species NOT DETECTED NOT DETECTED   Staphylococcus aureus (BCID) NOT DETECTED NOT DETECTED   Staphylococcus epidermidis NOT DETECTED NOT DETECTED   Staphylococcus lugdunensis NOT DETECTED NOT DETECTED   Streptococcus species NOT DETECTED NOT DETECTED   Streptococcus agalactiae NOT DETECTED NOT DETECTED   Streptococcus pneumoniae NOT DETECTED NOT DETECTED   Streptococcus pyogenes NOT DETECTED NOT DETECTED   A.calcoaceticus-baumannii NOT DETECTED NOT DETECTED   Bacteroides fragilis NOT DETECTED NOT DETECTED   Enterobacterales DETECTED (A) NOT DETECTED   Enterobacter cloacae complex NOT DETECTED NOT DETECTED   Escherichia coli DETECTED (A) NOT DETECTED   Klebsiella aerogenes NOT DETECTED NOT DETECTED   Klebsiella oxytoca NOT DETECTED NOT DETECTED   Klebsiella pneumoniae NOT DETECTED NOT DETECTED   Proteus species NOT DETECTED  NOT DETECTED   Salmonella species NOT DETECTED NOT DETECTED   Serratia marcescens NOT DETECTED NOT DETECTED   Haemophilus influenzae NOT DETECTED NOT DETECTED   Neisseria meningitidis NOT DETECTED NOT DETECTED   Pseudomonas aeruginosa NOT DETECTED NOT DETECTED   Stenotrophomonas maltophilia NOT DETECTED NOT DETECTED   Candida albicans NOT DETECTED NOT DETECTED   Candida auris NOT DETECTED NOT DETECTED   Candida glabrata NOT DETECTED NOT DETECTED   Candida krusei NOT DETECTED NOT DETECTED   Candida parapsilosis NOT DETECTED NOT DETECTED   Candida tropicalis NOT DETECTED NOT DETECTED   Cryptococcus neoformans/gattii NOT DETECTED NOT DETECTED   CTX-M ESBL NOT DETECTED NOT DETECTED   Carbapenem resistance IMP NOT DETECTED NOT DETECTED   Carbapenem resistance KPC NOT DETECTED NOT DETECTED   Carbapenem resistance NDM NOT DETECTED NOT DETECTED   Carbapenem resist OXA 48 LIKE NOT DETECTED NOT DETECTED   Carbapenem resistance VIM NOT DETECTED NOT DETECTED    Thank you for allowing pharmacy to be a part of this patient's care.  Alycia Rossetti, PharmD, BCPS Infectious Diseases Clinical Pharmacist 12/07/2022 3:35 PM   **Pharmacist phone directory can now be found on [email or web address] ([email or web address]).  Listed under [place].

## 2022-12-08 ENCOUNTER — Encounter (HOSPITAL_COMMUNITY)
Admission: EM | Disposition: A | Discharge status: Skilled Nursing Facility | Payer: Self-pay | Source: Home / Self Care | Attending: Internal Medicine

## 2022-12-08 ENCOUNTER — Inpatient Hospital Stay (HOSPITAL_COMMUNITY): Payer: Medicare Other

## 2022-12-08 ENCOUNTER — Inpatient Hospital Stay (HOSPITAL_COMMUNITY): Payer: Medicare Other | Admitting: Certified Registered Nurse Anesthetist

## 2022-12-08 ENCOUNTER — Encounter (HOSPITAL_COMMUNITY): Payer: Self-pay | Admitting: Internal Medicine

## 2022-12-08 DIAGNOSIS — N189 Chronic kidney disease, unspecified: Secondary | ICD-10-CM

## 2022-12-08 DIAGNOSIS — I129 Hypertensive chronic kidney disease with stage 1 through stage 4 chronic kidney disease, or unspecified chronic kidney disease: Secondary | ICD-10-CM

## 2022-12-08 DIAGNOSIS — F419 Anxiety disorder, unspecified: Secondary | ICD-10-CM | POA: Diagnosis not present

## 2022-12-08 DIAGNOSIS — Z4659 Encounter for fitting and adjustment of other gastrointestinal appliance and device: Secondary | ICD-10-CM

## 2022-12-08 DIAGNOSIS — R627 Adult failure to thrive: Secondary | ICD-10-CM | POA: Diagnosis not present

## 2022-12-08 DIAGNOSIS — R1310 Dysphagia, unspecified: Secondary | ICD-10-CM | POA: Diagnosis not present

## 2022-12-08 DIAGNOSIS — J45909 Unspecified asthma, uncomplicated: Secondary | ICD-10-CM

## 2022-12-08 DIAGNOSIS — Z87891 Personal history of nicotine dependence: Secondary | ICD-10-CM

## 2022-12-08 DIAGNOSIS — R630 Anorexia: Secondary | ICD-10-CM

## 2022-12-08 DIAGNOSIS — R634 Abnormal weight loss: Secondary | ICD-10-CM

## 2022-12-08 DIAGNOSIS — D631 Anemia in chronic kidney disease: Secondary | ICD-10-CM

## 2022-12-08 DIAGNOSIS — K838 Other specified diseases of biliary tract: Secondary | ICD-10-CM

## 2022-12-08 HISTORY — PX: ESOPHAGOGASTRODUODENOSCOPY (EGD) WITH PROPOFOL: SHX5813

## 2022-12-08 HISTORY — PX: ENDOSCOPIC RETROGRADE CHOLANGIOPANCREATOGRAPHY (ERCP) WITH PROPOFOL: SHX5810

## 2022-12-08 HISTORY — PX: STENT REMOVAL: SHX6421

## 2022-12-08 HISTORY — PX: REMOVAL OF STONES: SHX5545

## 2022-12-08 LAB — COMPREHENSIVE METABOLIC PANEL
ALT: 74 U/L — ABNORMAL HIGH (ref 0–44)
AST: 38 U/L (ref 15–41)
Albumin: 2.5 g/dL — ABNORMAL LOW (ref 3.5–5.0)
Alkaline Phosphatase: 264 U/L — ABNORMAL HIGH (ref 38–126)
Anion gap: 9 (ref 5–15)
BUN: 12 mg/dL (ref 8–23)
CO2: 25 mmol/L (ref 22–32)
Calcium: 8.4 mg/dL — ABNORMAL LOW (ref 8.9–10.3)
Chloride: 101 mmol/L (ref 98–111)
Creatinine, Ser: 1.22 mg/dL (ref 0.61–1.24)
GFR, Estimated: 60 mL/min — ABNORMAL LOW (ref >60)
Glucose, Bld: 127 mg/dL — ABNORMAL HIGH (ref 70–99)
Potassium: 4 mmol/L (ref 3.5–5.1)
Sodium: 135 mmol/L (ref 135–145)
Total Bilirubin: 1.3 mg/dL — ABNORMAL HIGH (ref 0.3–1.2)
Total Protein: 5.2 g/dL — ABNORMAL LOW (ref 6.5–8.1)

## 2022-12-08 LAB — CBC
HCT: 32.3 % — ABNORMAL LOW (ref 39.0–52.0)
Hemoglobin: 10.5 g/dL — ABNORMAL LOW (ref 13.0–17.0)
MCH: 31.7 pg (ref 26.0–34.0)
MCHC: 32.5 g/dL (ref 30.0–36.0)
MCV: 97.6 fL (ref 80.0–100.0)
Platelets: 206 10*3/uL (ref 150–400)
RBC: 3.31 MIL/uL — ABNORMAL LOW (ref 4.22–5.81)
RDW: 13.3 % (ref 11.5–15.5)
WBC: 7.4 10*3/uL (ref 4.0–10.5)
nRBC: 0 % (ref 0.0–0.2)

## 2022-12-08 LAB — URINE CULTURE: Culture: <10000 — AB

## 2022-12-08 SURGERY — ENDOSCOPIC RETROGRADE CHOLANGIOPANCREATOGRAPHY (ERCP) WITH PROPOFOL
Anesthesia: General

## 2022-12-08 SURGERY — ESOPHAGOGASTRODUODENOSCOPY (EGD) WITH PROPOFOL
Anesthesia: Monitor Anesthesia Care

## 2022-12-08 MED ORDER — PROPOFOL 10 MG/ML IV BOLUS
INTRAVENOUS | Status: DC | PRN
  Administered 2022-12-08 (×3): 20 mg via INTRAVENOUS

## 2022-12-08 MED ORDER — DEXAMETHASONE SODIUM PHOSPHATE 10 MG/ML IJ SOLN
INTRAMUSCULAR | Status: DC | PRN
  Administered 2022-12-08: 5 mg via INTRAVENOUS

## 2022-12-08 MED ORDER — LACTATED RINGERS IV SOLN: INTRAVENOUS | Status: DC

## 2022-12-08 MED ORDER — GLUCAGON HCL RDNA (DIAGNOSTIC) 1 MG IJ SOLR
INTRAMUSCULAR | Status: AC
  Filled 2022-12-08: qty 1

## 2022-12-08 MED ORDER — SUGAMMADEX SODIUM 200 MG/2ML IV SOLN
INTRAVENOUS | Status: DC | PRN
  Administered 2022-12-08: 200 mg via INTRAVENOUS

## 2022-12-08 MED ORDER — PHENYLEPHRINE 80 MCG/ML (10ML) SYRINGE FOR IV PUSH (FOR BLOOD PRESSURE SUPPORT)
PREFILLED_SYRINGE | INTRAVENOUS | Status: DC | PRN
  Administered 2022-12-08: 80 ug via INTRAVENOUS

## 2022-12-08 MED ORDER — ROCURONIUM BROMIDE 10 MG/ML (PF) SYRINGE
PREFILLED_SYRINGE | INTRAVENOUS | Status: DC | PRN
  Administered 2022-12-08: 80 mg via INTRAVENOUS

## 2022-12-08 MED ORDER — ONDANSETRON HCL 4 MG/2ML IJ SOLN
INTRAMUSCULAR | Status: DC | PRN
  Administered 2022-12-08: 4 mg via INTRAVENOUS

## 2022-12-08 MED ORDER — PROPOFOL 10 MG/ML IV BOLUS
INTRAVENOUS | Status: DC | PRN
  Administered 2022-12-08: 120 mg via INTRAVENOUS

## 2022-12-08 MED ORDER — DICLOFENAC SUPPOSITORY 100 MG
RECTAL | Status: AC
  Filled 2022-12-08: qty 1

## 2022-12-08 MED ORDER — ORAL CARE MOUTH RINSE
15.0000 mL | OROMUCOSAL | Status: DC | PRN
  Administered 2022-12-08: 15 mL via OROMUCOSAL

## 2022-12-08 MED ORDER — ORAL CARE MOUTH RINSE
15.0000 mL | OROMUCOSAL | Status: DC
  Administered 2022-12-08 – 2022-12-14 (×19): 15 mL via OROMUCOSAL

## 2022-12-08 MED ORDER — DICLOFENAC SUPPOSITORY 100 MG
RECTAL | Status: DC | PRN
  Administered 2022-12-08: 100 mg via RECTAL

## 2022-12-08 MED ORDER — PHENYLEPHRINE HCL-NACL 20-0.9 MG/250ML-% IV SOLN
INTRAVENOUS | Status: DC | PRN
  Administered 2022-12-08: 25 ug/min via INTRAVENOUS

## 2022-12-08 MED ORDER — PROPOFOL 500 MG/50ML IV EMUL
INTRAVENOUS | Status: DC | PRN
  Administered 2022-12-08: 100 ug/kg/min via INTRAVENOUS

## 2022-12-08 MED ORDER — GLUCAGON HCL RDNA (DIAGNOSTIC) 1 MG IJ SOLR
INTRAMUSCULAR | Status: DC | PRN
  Administered 2022-12-08 (×2): .25 mg via INTRAVENOUS

## 2022-12-08 MED ORDER — SODIUM CHLORIDE 0.9 % IV SOLN: INTRAVENOUS | Status: DC | PRN

## 2022-12-08 MED ORDER — LIDOCAINE 2% (20 MG/ML) 5 ML SYRINGE
INTRAMUSCULAR | Status: DC | PRN
  Administered 2022-12-08: 40 mg via INTRAVENOUS

## 2022-12-08 MED ORDER — LIDOCAINE 2% (20 MG/ML) 5 ML SYRINGE
INTRAMUSCULAR | Status: DC | PRN
  Administered 2022-12-08: 60 mg via INTRAVENOUS

## 2022-12-08 MED ORDER — PHENYLEPHRINE 80 MCG/ML (10ML) SYRINGE FOR IV PUSH (FOR BLOOD PRESSURE SUPPORT)
PREFILLED_SYRINGE | INTRAVENOUS | Status: DC | PRN
  Administered 2022-12-08: 80 ug via INTRAVENOUS
  Administered 2022-12-08 (×2): 160 ug via INTRAVENOUS
  Administered 2022-12-08 (×2): 80 ug via INTRAVENOUS
  Administered 2022-12-08: 160 ug via INTRAVENOUS

## 2022-12-08 SURGICAL SUPPLY — 15 items

## 2022-12-08 NOTE — Anesthesia Postprocedure Evaluation (Signed)
Anesthesia Post Note  Patient: Francisco [name]  Procedure(s) Performed: ENDOSCOPIC RETROGRADE CHOLANGIOPANCREATOGRAPHY (ERCP) WITH PROPOFOL STENT REMOVAL REMOVAL OF STONES     Patient location during evaluation: PACU Anesthesia Type: General Level of consciousness: awake and alert, oriented and patient cooperative Pain management: pain level controlled Vital Signs Assessment: post-procedure vital signs reviewed and stable Respiratory status: spontaneous breathing, nonlabored ventilation and respiratory function stable Cardiovascular status: blood pressure returned to baseline and stable Postop Assessment: no apparent nausea or vomiting Anesthetic complications: no   Encounter Notable Events  Notable Event Outcome Phase Comment  Difficult to intubate - expected  Intraprocedure Filed from anesthesia note documentation.    Last Vitals:  Vitals:   12/08/22 1528 12/08/22 1600  BP: 139/73 128/66  Pulse: 64 61  Resp: 18 18  Temp: 37.1 C 36.5 C  SpO2: 98% 99%    Last Pain:  Vitals:   12/08/22 1600  TempSrc: Oral  PainSc:                  [name]

## 2022-12-08 NOTE — Interval H&P Note (Signed)
History and Physical Interval Note:  12/08/2022 1:31 PM  Francisco [name]  has presented today for surgery, with the diagnosis of Remove stent from bile duct..  The various methods of treatment have been discussed with the patient and family. After consideration of risks, benefits and other options for treatment, the patient has consented to  Procedure(s): ENDOSCOPIC RETROGRADE CHOLANGIOPANCREATOGRAPHY (ERCP) WITH PROPOFOL (N/A) as a surgical intervention.  The patient's history has been reviewed, patient examined, no change in status, stable for surgery.  I have reviewed the patient's chart and labs.  Questions were answered to the patient's satisfaction.     Pricilla Riffle. [place] Plan

## 2022-12-08 NOTE — Anesthesia Procedure Notes (Signed)
Procedure Name: MAC Date/Time: 12/08/2022 8:36 AM  Performed by: Colin Benton, CRNAPre-anesthesia Checklist: Patient identified, Emergency Drugs available, Suction available and Patient being monitored Patient Re-evaluated:Patient Re-evaluated prior to induction Oxygen Delivery Method: Nasal cannula Induction Type: IV induction Airway Equipment and Method: Bite block Placement Confirmation: positive ETCO2 Dental Injury: Teeth and Oropharynx as per pre-operative assessment

## 2022-12-08 NOTE — Interval H&P Note (Signed)
History and Physical Interval Note:  12/08/2022 8:24 AM  Francisco [name]  has presented today for surgery, with the diagnosis of dysphagia.  anorexia.  wt loss..  The various methods of treatment have been discussed with the patient and family. After consideration of risks, benefits and other options for treatment, the patient has consented to  Procedure(s): ESOPHAGOGASTRODUODENOSCOPY (EGD) WITH PROPOFOL (N/A) SAVORY DILATION (N/A) as a surgical intervention.  The patient's history has been reviewed, patient examined, no change in status, stable for surgery.  I have reviewed the patient's chart and labs.  Questions were answered to the patient's satisfaction.     Bernestine Holsapple

## 2022-12-08 NOTE — Anesthesia Preprocedure Evaluation (Addendum)
Anesthesia Evaluation  Patient identified by MRN, date of birth, ID band Patient awake    Reviewed: Allergy & Precautions, NPO status , Patient's Chart, lab work & pertinent test results  History of Anesthesia Complications Negative for: history of anesthetic complications  Airway Mallampati: III  TM Distance: >3 FB Neck ROM: Limited    Dental  (+) Teeth Intact, Dental Advisory Given,    Pulmonary asthma , former smoker   breath sounds clear to auscultation       Cardiovascular hypertension, Pt. on home beta blockers + Peripheral Vascular Disease   Rhythm:Regular Rate:Normal     Neuro/Psych  PSYCHIATRIC DISORDERS Anxiety     negative neurological ROS     GI/Hepatic Neg liver ROS,GERD  Medicated,,  Endo/Other  negative endocrine ROS    Renal/GU CRFRenal diseaseLab Results      Component                Value               Date                      CREATININE               1.88 (H)            12/07/2022                Musculoskeletal negative musculoskeletal ROS (+)    Abdominal Normal abdominal exam  (+)   Peds  Hematology  (+) Blood dyscrasia, anemia Lab Results      Component                Value               Date                      WBC                      7.4                 12/08/2022                HGB                      10.5 (L)            12/08/2022                HCT                      32.3 (L)            12/08/2022                MCV                      97.6                12/08/2022                PLT                      206                 12/08/2022              Anesthesia Other Findings   Reproductive/Obstetrics  Anesthesia Physical Anesthesia Plan  ASA: 3  Anesthesia Plan: MAC   Post-op Pain Management:    Induction: Intravenous  PONV Risk Score and Plan: 1 and Treatment may vary due to age or medical condition and Propofol  infusion  Airway Management Planned: Nasal Cannula, Natural Airway and Simple Face Mask  Additional Equipment: None  Intra-op Plan:   Post-operative Plan:   Informed Consent: I have reviewed the patients History and Physical, chart, labs and discussed the procedure including the risks, benefits and alternatives for the proposed anesthesia with the patient or authorized representative who has indicated his/her understanding and acceptance.     Dental advisory given  Plan Discussed with: CRNA  Anesthesia Plan Comments:         Anesthesia Quick Evaluation

## 2022-12-08 NOTE — Transfer of Care (Signed)
Immediate Anesthesia Transfer of Care Note  Patient: Francisco [name]  Procedure(s) Performed: ESOPHAGOGASTRODUODENOSCOPY (EGD) WITH PROPOFOL Balloon dilation wire-guided  Patient Location: Endoscopy Unit  Anesthesia Type:MAC  Level of Consciousness: awake, alert , oriented, and patient cooperative  Airway & Oxygen Therapy: Patient Spontanous Breathing  Post-op Assessment: Report given to RN and Post -op Vital signs reviewed and stable  Post vital signs: Reviewed and stable  Last Vitals:  Vitals Value Taken Time  BP 89/44 12/08/22 0900  Temp    Pulse 50 12/08/22 0900  Resp 17 12/08/22 0900  SpO2 97 % 12/08/22 0900  Vitals shown include unvalidated device data.  Last Pain:  Vitals:   12/08/22 0750  TempSrc: Temporal  PainSc: 0-No pain         Complications: No notable events documented.

## 2022-12-08 NOTE — Op Note (Signed)
[facility] Patient Name: Francisco [name] Procedure Date : 12/08/2022 MRN: [id number] Attending MD: Mauri Pole , MD, [id number] Date of Birth: 08/15/1941 CSN: [id number] Age: 82 Admit Type: Inpatient Procedure:                Upper GI endoscopy Indications:              Dysphagia Providers:                Mauri Pole, MD, Grace Isaac, RN, Fransico Setters                            Mbumina, Technician Referring MD:              Medicines:                Monitored Anesthesia Care Complications:            No immediate complications. Estimated Blood Loss:     Estimated blood loss: none. Procedure:                Pre-Anesthesia Assessment:                           - Prior to the procedure, a History and Physical                            was performed, and patient medications and                            allergies were reviewed. The patient's tolerance of                            previous anesthesia was also reviewed. The risks                            and benefits of the procedure and the sedation                            options and risks were discussed with the patient.                            All questions were answered, and informed consent                            was obtained. Prior Anticoagulants: The patient has                            taken no anticoagulant or antiplatelet agents. ASA                            Grade Assessment: III - A patient with severe                            systemic disease. After reviewing the risks and  benefits, the patient was deemed in satisfactory                            condition to undergo the procedure.                           After obtaining informed consent, the endoscope was                            passed under direct vision. Throughout the                            procedure, the patient's blood pressure, pulse, and                            oxygen saturations were  monitored continuously. The                            [id number]-[id number] ([id number]) Olympus endoscope was introduced                            through the mouth, and advanced to the second part                            of duodenum. The upper GI endoscopy was                            accomplished without difficulty. The patient                            tolerated the procedure well. Scope In: Scope Out: Findings:      The Z-line was regular and was found 38 cm from the incisors.      No endoscopic abnormality was evident in the esophagus to explain the       patient's complaint of dysphagia. It was decided, however, to proceed       with dilation of the lower third of the esophagus. A TTS dilator was       passed through the scope. Dilation with an 18-19-20 mm x 8 cm CRE       balloon dilator was performed to 20 mm. The dilation site was examined       and showed no change.      A 3 cm hiatal hernia was present.      The stomach was normal.      The cardia and gastric fundus were normal on retroflexion.      A previously placed biliary plastic stent was seen in the second portion       of the duodenum.      The exam of the duodenum was otherwise normal. Impression:               - Z-line regular, 38 cm from the incisors.                           - No endoscopic esophageal abnormality to explain  patient's dysphagia. Esophagus dilated. Dilated.                           - 3 cm hiatal hernia.                           - Normal stomach.                           - Biliary stent in the duodenum.                           - No specimens collected. Recommendation:           - Resume previous diet.                           - Continue present medications.                           - Perform a modified barium swallow and Barium                            esophagogram to exclude oro pharyngeal dysphagia or                            extrinsic compression of esophagus by  cervical                            osteophyte                           - Will discuss with Dr.Stark (ERCP back up)                            regarding management of retained plastic biliary                            stent Procedure Code(s):        --- Professional ---                           [id number], Esophagogastroduodenoscopy, flexible,                            transoral; with transendoscopic balloon dilation of                            esophagus (less than 30 mm diameter) Diagnosis Code(s):        --- Professional ---                           R13.10, Dysphagia, unspecified                           K44.9, Diaphragmatic hernia without obstruction or  gangrene CPT copyright 2022 [employer]. All rights reserved. The codes documented in this report are preliminary and upon coder review may  be revised to meet current compliance requirements. Mauri Pole, MD 12/08/2022 9:04:12 AM This report has been signed electronically. Number of Addenda: 0

## 2022-12-08 NOTE — Progress Notes (Signed)
PROGRESS NOTE    Francisco [name]  NLG:[id number] DOB: [birth year] DOA: 12/07/2022 PCP: [name], FNP    Brief Narrative:   Francisco [name] is a 82 y.o. male with past medical history significant for HTN, HLD, CKD stage IIIa, BPH, who presented to [facility] ED on 1/22 with complaints of generalized weakness, dysphagia with associated nausea/vomiting.  Also reports rigors and urinary frequency and dysuria day prior to admission.  Recently admitted October/2023 with cervical fracture s/p fusion.  Prior to that admitted June 2023 for E. coli bacteremia.  Admitted August 2023 with acute on chronic cholecystitis requiring laparoscopic cholecystectomy with drain placement complicated by bile leak postoperatively required placement of a CBD stent via ERCP.  Following these multiple hospitalizations, patient reports he has not been doing "well".  In the ED, temperature 97.9 F, HR 91, RR 18, BP 113/69, SpO2 94% on room air.  WBC 15.8, hemoglobin 12.2, platelets 246.  Sodium 136, potassium 4.0, chloride 100, CO2 24, glucose 218, BUN 20, creatinine 1.88.  Alkaline phosphatase 411.  AST 90, ALT 130, total bilirubin 3.9.  Lactic acid 1.2.  COVID/RSV/influenza PCR negative.  Urinalysis with negative leukocytes, positive nitrite, few bacteria, 0-5 WBCs.  Chest x-ray with no active cardiopulmonary disease process.  Abdominal ultrasound with cholecystectomy without biliary dilation.  GI was consulted.  Blood cultures x 2 drawn.  Patient was started on empiric ceftriaxone and metronidazole.  TRH consulted for admission for further evaluation and management.  Assessment & Plan:    Klebsiella pneumonia septicemia, POA Urinary tract infection Patient presenting to ED with progressive weakness, admits to urinary symptoms with dysuria and increased urinary frequency.  Urinalysis with positive nitrite, few bacteria.  Blood cultures positive for Klebsiella pneumonia per BC ID. --WBC 15.2>7.4 -- Blood cultures x 2:  3 out 4 + GNRs; BCID Klebsiella pneumonia, pending susceptibilities -- Ceftriaxone 2 g IV every 24 hours -- Metronidazole 500 mg IV every 12 hours -- CBC daily  Elevated LFTs With subtotal cholecystectomy and ERCP biliary stent placement for postop bile leak on 06/2022. --GI following, appreciate assist --AST 90>38 --ALT 150>74 --Tbili 3.9>1.3 --Holding home simvastatin --ERCP planned this afternoon. -- Avoid hepatotoxins -- Repeat CMP in a.m.  Acute renal failure on CKD stage IIIa Etiology likely secondary to prerenal azotemia in setting of dehydration versus ATN from septicemia as above. -- Cr 1.88>1.22 -- LR at 100 mL/h -- CMP daily  Dysphagia Adult failure to thrive Patient reports difficulty eating solids, and sometimes liquids.  Seen by GI underwent EGD on 1/23 with no significant findings but dilation performed. -- Barium esophagram: Pending  Essential hypertension --Metoprolol succinate 25 mg p.o. twice daily -- Continue aspirin, holding statin due to elevated LFTs  Hyperlipidemia Holding home simvastatin due to elevated LFTs  Anxiety/depression -- Lexapro 20 mg p.o. daily -- Xanax 1 mg p.o. twice daily as needed sleep/anxiety  BPH -- Tamsulosin 0.4 mg p.o. daily  GERD: Protonix 40 mg p.o. daily  DVT prophylaxis: SCDs Start: 12/07/22 0854    Code Status: DNR Family Communication: No family present at bedside this morning  Disposition Plan:  Level of care: Med-Surg Status is: Inpatient Remains inpatient appropriate because: Pending ERCP, IV antibiotics, further depending on GI recommendations    Consultants:  [place] [facility]  Procedures:  EGD 1/23 ERCP: Pending  Antimicrobials:  Ceftriaxone 1/22>> Metronidazole 1/22>>   Subjective: Patient seen examined bedside, resting calmly.  Lying in bed.  No specific complaints this morning.  About to be taken down  for EGD.  Continues to endorse issues with swallowing solids, feels like it is  getting "stuck".  Also has occasional issues swallowing liquids.  Discussed with patient positive blood cultures and continues on IV antibiotics for that.  No other specific complaints or concerns at this time.  Denies headache, no dizziness, no chest pain, no palpitations, no shortness of breath, no abdominal pain, no fever/chills/night sweats, no current nausea/vomiting/diarrhea, no focal weakness, no fatigue, no paresthesias.  No acute events overnight per nursing staff.  Objective: Vitals:   12/08/22 0910 12/08/22 0915 12/08/22 0920 12/08/22 0948  BP: 121/71  128/65 (!) 156/70  Pulse: (!) 51 (!) 53 (!) 58 (!) 53  Resp: '16 15 15 16  '$ Temp:    98.1 F (36.7 C)  TempSrc:    Oral  SpO2: 97% 97% 96% 98%  Weight:      Height:        Intake/Output Summary (Last 24 hours) at 12/08/2022 1135 Last data filed at 12/08/2022 0857 Gross per 24 hour  Intake 2242.33 ml  Output 550 ml  Net 1692.33 ml   Filed Weights   12/07/22 0058 12/07/22 1302  Weight: 77.1 kg 74.7 kg    Examination:  Physical Exam: GEN: NAD, alert and oriented x 3, chronically ill appearance HEENT: NCAT, PERRL, EOMI, sclera clear, dry mucous membranes, left lower lip lesion, poor dentition PULM: CTAB w/o wheezes/crackles, normal respiratory effort, on room air CV: RRR w/o M/G/R GI: abd soft, NTND, NABS, no R/G/M MSK: no peripheral edema, muscle strength globally intact 5/5 bilateral upper/lower extremities NEURO: CN II-XII intact, no focal deficits, sensation to light touch intact PSYCH: normal mood/affect Integumentary: Noted left lower lip lesion, otherwise no other concerning rashes/lesions/wounds noted on exposed skin surfaces     Data Reviewed: I have personally reviewed following labs and imaging studies  CBC: Recent Labs  Lab 12/07/22 0111 12/08/22 0653  WBC 15.8* 7.4  NEUTROABS 14.8*  --   HGB 12.2* 10.5*  HCT 37.5* 32.3*  MCV 97.7 97.6  PLT 246 599   Basic Metabolic Panel: Recent Labs  Lab  12/07/22 0111 12/08/22 0653  NA 136 135  K 4.0 4.0  CL 100 101  CO2 24 25  GLUCOSE 218* 127*  BUN 20 12  CREATININE 1.88* 1.22  CALCIUM 8.9 8.4*   GFR: Estimated Creatinine Clearance: 47.5 mL/min (by C-G formula based on SCr of 1.22 mg/dL). Liver Function Tests: Recent Labs  Lab 12/07/22 0111 12/08/22 0653  AST 90* 38  ALT 130* 74*  ALKPHOS 411* 264*  BILITOT 3.9* 1.3*  PROT 6.6 5.2*  ALBUMIN 3.2* 2.5*   No results for input(s): "LIPASE", "AMYLASE" in the last 168 hours. No results for input(s): "AMMONIA" in the last 168 hours. Coagulation Profile: No results for input(s): "INR", "PROTIME" in the last 168 hours. Cardiac Enzymes: No results for input(s): "CKTOTAL", "CKMB", "CKMBINDEX", "TROPONINI" in the last 168 hours. BNP (last 3 results) No results for input(s): "PROBNP" in the last 8760 hours. HbA1C: No results for input(s): "HGBA1C" in the last 72 hours. CBG: No results for input(s): "GLUCAP" in the last 168 hours. Lipid Profile: No results for input(s): "CHOL", "HDL", "LDLCALC", "TRIG", "CHOLHDL", "LDLDIRECT" in the last 72 hours. Thyroid Function Tests: No results for input(s): "TSH", "T4TOTAL", "FREET4", "T3FREE", "THYROIDAB" in the last 72 hours. Anemia Panel: No results for input(s): "VITAMINB12", "FOLATE", "FERRITIN", "TIBC", "IRON", "RETICCTPCT" in the last 72 hours. Sepsis Labs: Recent Labs  Lab 12/07/22 0111  LATICACIDVEN 1.2  Recent Results (from the past 240 hour(s))  Blood culture (routine x 2)     Status: Abnormal (Preliminary result)   Collection Time: 12/07/22  1:11 AM   Specimen: BLOOD RIGHT ARM  Result Value Ref Range Status   Specimen Description BLOOD RIGHT ARM  Final   Special Requests   Final    BOTTLES DRAWN AEROBIC AND ANAEROBIC Blood Culture results may not be optimal due to an excessive volume of blood received in culture bottles   Culture  Setup Time   Final    ANAEROBIC BOTTLE ONLY GRAM NEGATIVE RODS CRITICAL RESULT CALLED  TO, READ BACK BY AND VERIFIED WITH:  C/ PHARMD E. MARTIN 12/07/22 1518 A. LAFRANCE    Culture (A)  Final    KLEBSIELLA PNEUMONIAE SUSCEPTIBILITIES TO FOLLOW Performed at [facility], [place] [address], [place], [place] [address]    Report Status PENDING  Incomplete  Urine Culture     Status: Abnormal   Collection Time: 12/07/22  1:11 AM   Specimen: Urine, Clean Catch  Result Value Ref Range Status   Specimen Description URINE, CLEAN CATCH  Final   Special Requests NONE  Final   Culture (A)  Final    <10,000 COLONIES/mL INSIGNIFICANT GROWTH Performed at [facility], [place] [address], [place], [place] [address]    Report Status 12/08/2022 FINAL  Final  Resp panel by RT-PCR (RSV, Flu A&B, Covid) Anterior Nasal Swab     Status: None   Collection Time: 12/07/22  1:11 AM   Specimen: Anterior Nasal Swab  Result Value Ref Range Status   SARS Coronavirus 2 by RT PCR NEGATIVE NEGATIVE Final    Comment: (NOTE) SARS-CoV-2 target nucleic acids are NOT DETECTED.  The SARS-CoV-2 RNA is generally detectable in upper respiratory specimens during the acute phase of infection. The lowest concentration of SARS-CoV-2 viral copies this assay can detect is 138 copies/mL. A negative result does not preclude SARS-Cov-2 infection and should not be used as the sole basis for treatment or other patient management decisions. A negative result may occur with  improper specimen collection/handling, submission of specimen other than nasopharyngeal swab, presence of viral mutation(s) within the areas targeted by this assay, and inadequate number of viral copies(<138 copies/mL). A negative result must be combined with clinical observations, patient history, and epidemiological information. The expected result is Negative.  Fact Sheet for Patients:  EntrepreneurPulse.com.au  Fact Sheet for Healthcare Providers:  IncredibleEmployment.be  This test is no t yet  approved or cleared by the [place] FDA and  has been authorized for detection and/or diagnosis of SARS-CoV-2 by FDA under an Emergency Use Authorization (EUA). This EUA will remain  in effect (meaning this test can be used) for the duration of the COVID-19 declaration under Section 564(b)(1) of the Act, 21 U.S.C.section 360bbb-3(b)(1), unless the authorization is terminated  or revoked sooner.       Influenza A by PCR NEGATIVE NEGATIVE Final   Influenza B by PCR NEGATIVE NEGATIVE Final    Comment: (NOTE) The Xpert Xpress SARS-CoV-2/FLU/RSV plus assay is intended as an aid in the diagnosis of influenza from Nasopharyngeal swab specimens and should not be used as a sole basis for treatment. Nasal washings and aspirates are unacceptable for Xpert Xpress SARS-CoV-2/FLU/RSV testing.  Fact Sheet for Patients: EntrepreneurPulse.com.au  Fact Sheet for Healthcare Providers: IncredibleEmployment.be  This test is not yet approved or cleared by the [place] FDA and has been authorized for detection and/or diagnosis of  SARS-CoV-2 by FDA under an Emergency Use Authorization (EUA). This EUA will remain in effect (meaning this test can be used) for the duration of the COVID-19 declaration under Section 564(b)(1) of the Act, 21 U.S.C. section 360bbb-3(b)(1), unless the authorization is terminated or revoked.     Resp Syncytial Virus by PCR NEGATIVE NEGATIVE Final    Comment: (NOTE) Fact Sheet for Patients: EntrepreneurPulse.com.au  Fact Sheet for Healthcare Providers: IncredibleEmployment.be  This test is not yet approved or cleared by the [place] FDA and has been authorized for detection and/or diagnosis of SARS-CoV-2 by FDA under an Emergency Use Authorization (EUA). This EUA will remain in effect (meaning this test can be used) for the duration of the COVID-19 declaration under Section 564(b)(1) of  the Act, 21 U.S.C. section 360bbb-3(b)(1), unless the authorization is terminated or revoked.  Performed at [facility], [place] [address], [place], [place] [address]   Blood Culture ID Panel (Reflexed)     Status: Abnormal   Collection Time: 12/07/22  1:11 AM  Result Value Ref Range Status   Enterococcus faecalis NOT DETECTED NOT DETECTED Final   Enterococcus Faecium NOT DETECTED NOT DETECTED Final   Listeria monocytogenes NOT DETECTED NOT DETECTED Final   Staphylococcus species NOT DETECTED NOT DETECTED Final   Staphylococcus aureus (BCID) NOT DETECTED NOT DETECTED Final   Staphylococcus epidermidis NOT DETECTED NOT DETECTED Final   Staphylococcus lugdunensis NOT DETECTED NOT DETECTED Final   Streptococcus species NOT DETECTED NOT DETECTED Final   Streptococcus agalactiae NOT DETECTED NOT DETECTED Final   Streptococcus pneumoniae NOT DETECTED NOT DETECTED Final   Streptococcus pyogenes NOT DETECTED NOT DETECTED Final   A.calcoaceticus-baumannii NOT DETECTED NOT DETECTED Final   Bacteroides fragilis NOT DETECTED NOT DETECTED Final   Enterobacterales DETECTED (A) NOT DETECTED Final    Comment: Enterobacterales represent a large order of gram negative bacteria, not a single organism. CRITICAL RESULT CALLED TO, READ BACK BY AND VERIFIED WITH:  C/ PHARMD E. MARTIN 12/07/22 1518 A. LAFRANCE    Enterobacter cloacae complex NOT DETECTED NOT DETECTED Final   Escherichia coli NOT DETECTED NOT DETECTED Final   Klebsiella aerogenes NOT DETECTED NOT DETECTED Final   Klebsiella oxytoca NOT DETECTED NOT DETECTED Final   Klebsiella pneumoniae DETECTED (A) NOT DETECTED Final    Comment: CRITICAL RESULT CALLED TO, READ BACK BY AND VERIFIED WITH:  C/ PHARMD E. MARTIN 12/07/22 1518 A. LAFRANCE    Proteus species NOT DETECTED NOT DETECTED Final   Salmonella species NOT DETECTED NOT DETECTED Final   Serratia marcescens NOT DETECTED NOT DETECTED Final   Haemophilus influenzae NOT DETECTED NOT  DETECTED Final   Neisseria meningitidis NOT DETECTED NOT DETECTED Final   Pseudomonas aeruginosa NOT DETECTED NOT DETECTED Final   Stenotrophomonas maltophilia NOT DETECTED NOT DETECTED Final   Candida albicans NOT DETECTED NOT DETECTED Final   Candida auris NOT DETECTED NOT DETECTED Final   Candida glabrata NOT DETECTED NOT DETECTED Final   Candida krusei NOT DETECTED NOT DETECTED Final   Candida parapsilosis NOT DETECTED NOT DETECTED Final   Candida tropicalis NOT DETECTED NOT DETECTED Final   Cryptococcus neoformans/gattii NOT DETECTED NOT DETECTED Final   CTX-M ESBL NOT DETECTED NOT DETECTED Final   Carbapenem resistance IMP NOT DETECTED NOT DETECTED Final   Carbapenem resistance KPC NOT DETECTED NOT DETECTED Final   Carbapenem resistance NDM NOT DETECTED NOT DETECTED Final   Carbapenem resist OXA 48 LIKE NOT DETECTED NOT DETECTED Final   Carbapenem resistance VIM NOT  DETECTED NOT DETECTED Final    Comment: Performed at [facility], [place] [address], [place], [place] [address]  Blood culture (routine x 2)     Status: Abnormal (Preliminary result)   Collection Time: 12/07/22  3:07 AM   Specimen: BLOOD LEFT HAND  Result Value Ref Range Status   Specimen Description BLOOD LEFT HAND  Final   Special Requests   Final    BOTTLES DRAWN AEROBIC AND ANAEROBIC Blood Culture adequate volume   Culture  Setup Time   Final    GRAM NEGATIVE RODS IN BOTH AEROBIC AND ANAEROBIC BOTTLES CRITICAL RESULT CALLED TO, READ BACK BY AND VERIFIED WITH: PHARMD KAILEY MIKOLICHEK ON [birth year] @ 0981 BY DRT Performed at [facility], [place] [address] [place], [place] [address]    Culture KLEBSIELLA PNEUMONIAE (A)  Final   Report Status PENDING  Incomplete         Radiology Studies: US Abdomen Limited  Result Date: 12/07/2022 CLINICAL DATA:  Elevated liver function tests. History of biliary stent EXAM: ULTRASOUND ABDOMEN LIMITED RIGHT UPPER QUADRANT COMPARISON:  Abdominal CT 07/12/2022  FINDINGS: Gallbladder: Surgically absent Common bile duct: Diameter: 7 mm.  Where visualized, no filling defect. Liver: No focal lesion identified. Within normal limits in parenchymal echogenicity. Portal vein is patent on color Doppler imaging with normal direction of blood flow towards the liver. IMPRESSION: Cholecystectomy without biliary dilatation. Electronically Signed   By: Jorje Guild M.D.   On: 12/07/2022 05:45   DG Chest 2 View  Result Date: 12/07/2022 CLINICAL DATA:  Fever. EXAM: CHEST - 2 VIEW COMPARISON:  July 10, 2022 FINDINGS: The heart size and mediastinal contours are within normal limits. There is moderate severity calcification of the aortic arch. Both lungs are clear. A subcentimeter radiopaque soft tissue foreign body is seen within the anterior aspect of the right shoulder. A radiopaque fusion plate and screws are seen overlying the lower cervical spine. Multilevel degenerative changes are noted throughout the thoracic spine. IMPRESSION: No active cardiopulmonary disease. Electronically Signed   By: Virgina Norfolk M.D.   On: 12/07/2022 01:38        Scheduled Meds:  aspirin  81 mg Oral Daily   escitalopram  20 mg Oral Daily   feeding supplement  237 mL Oral TID BM   metoprolol succinate  25 mg Oral BID   mouth rinse  15 mL Mouth Rinse 4 times per day   pantoprazole  40 mg Oral Daily   tamsulosin  0.4 mg Oral Daily   Continuous Infusions:  sodium chloride Stopped (12/08/22 0624)   cefTRIAXone (ROCEPHIN)  IV Stopped (12/08/22 0615)   lactated ringers 100 mL/hr at 12/08/22 8295   metronidazole 500 mg (12/08/22 1002)     LOS: 1 day    Time spent: 52 minutes spent on chart review, discussion with nursing staff, consultants, updating family and interview/physical exam; more than 50% of that time was spent in counseling and/or coordination of care.    Quandarius Nill J [place] DO [facility] Available via Epic secure chat 7am-7pm After these hours, please refer  to coverage provider listed on [email or web address] 12/08/2022, 11:35 AM

## 2022-12-08 NOTE — Progress Notes (Signed)
Initial Nutrition Assessment  DOCUMENTATION CODES:   Severe malnutrition in context of chronic illness  INTERVENTION:  - Add Ensure Enlive po BID, each supplement provides 350 kcal and 20 grams of protein.  - Consider Cortrak placement if swallowing doesn't improve and aligns with GOC.   NUTRITION DIAGNOSIS:   Severe Malnutrition related to chronic illness as evidenced by energy intake < 75% for > or equal to 1 month, percent weight loss.  GOAL:   Patient will meet greater than or equal to 90% of their needs  MONITOR:   PO intake, Supplement acceptance  REASON FOR ASSESSMENT:   Consult Assessment of nutrition requirement/status  ASSESSMENT:   82 y.o. male admits related to weakness. PMH includes: HTN, HLD, stage 3 CKD and BPH. Pt is currently receiving medical management related to FTT.  Meds reviewed. Labs reviewed.   Pt reports that he has no appetite and has not been eating well for the past 2-3 months. He states that he has lost about 40 lbs over the past 6 months. Per record, pt has experienced a 19% wt loss over the past 5 months, which is significant. Pt is currently NPO but when RD saw pt in-person on 1/22, he had a full liquid diet. Pt states that he has had some issues with swallowing. SLP is following. Pt agreed to try Ensure. RD added supplements on 1/22. Will continue to monitor PO intakes and diet advancement.   NUTRITION - FOCUSED PHYSICAL EXAM:  Flowsheet Row Most Recent Value  Orbital Region Mild depletion  Upper Arm Region Mild depletion  Thoracic and Lumbar Region Mild depletion  Buccal Region Mild depletion  Temple Region Mild depletion  Clavicle Bone Region Mild depletion  Clavicle and Acromion Bone Region Mild depletion  Scapular Bone Region Mild depletion  Dorsal Hand Mild depletion  Patellar Region Mild depletion  Anterior Thigh Region Mild depletion  Posterior Calf Region Mild depletion  Edema (RD Assessment) None  Hair Reviewed  Eyes  Reviewed  Mouth Reviewed  Skin Reviewed  Nails Reviewed       Diet Order:   Diet Order             Diet NPO time specified  Diet effective midnight                   EDUCATION NEEDS:   Not appropriate for education at this time  Skin:  Skin Assessment: Reviewed RN Assessment  Last BM:  12/05/22  Height:   Ht Readings from Last 1 Encounters:  12/07/22 '5\' 9"'$  (1.753 m)    Weight:   Wt Readings from Last 1 Encounters:  12/07/22 74.7 kg    Ideal Body Weight:     BMI:  Body mass index is 24.32 kg/m.  Estimated Nutritional Needs:   Kcal:  9371-6967 kcals  Protein:  90-110 gm  Fluid:  >/= 1.8 L  Thalia Bloodgood, RD, LDN, CNSC.

## 2022-12-08 NOTE — Anesthesia Preprocedure Evaluation (Signed)
Anesthesia Evaluation  Patient identified by MRN, date of birth, ID band Patient awake    Reviewed: Allergy & Precautions, NPO status , Patient's Chart, lab work & pertinent test results  Airway Mallampati: III  TM Distance: >3 FB Neck ROM: Limited    Dental  (+) Dental Advisory Given, Teeth Intact   Pulmonary asthma , former smoker   Pulmonary exam normal breath sounds clear to auscultation       Cardiovascular hypertension, Pt. on medications + Peripheral Vascular Disease  Normal cardiovascular exam Rhythm:Regular Rate:Normal     Neuro/Psych  PSYCHIATRIC DISORDERS Anxiety     negative neurological ROS     GI/Hepatic Neg liver ROS,GERD  Medicated and Controlled,,  Endo/Other    Renal/GU Renal InsufficiencyRenal diseaseCKD 3, Cr 1.22  negative genitourinary   Musculoskeletal negative musculoskeletal ROS (+)    Abdominal   Peds  Hematology  (+) Blood dyscrasia, anemia Hb 10.5   Anesthesia Other Findings   Reproductive/Obstetrics negative OB ROS                             Anesthesia Physical Anesthesia Plan  ASA: 3  Anesthesia Plan: General   Post-op Pain Management:    Induction: Intravenous  PONV Risk Score and Plan: 2 and Ondansetron and Treatment may vary due to age or medical condition  Airway Management Planned: Oral ETT and Video Laryngoscope Planned  Additional Equipment: None  Intra-op Plan:   Post-operative Plan: Extubation in OR  Informed Consent: I have reviewed the patients History and Physical, chart, labs and discussed the procedure including the risks, benefits and alternatives for the proposed anesthesia with the patient or authorized representative who has indicated his/her understanding and acceptance.     Dental advisory given and Consent reviewed with POA  Plan Discussed with: CRNA  Anesthesia Plan Comments:        Anesthesia Quick  Evaluation

## 2022-12-08 NOTE — Transfer of Care (Signed)
Immediate Anesthesia Transfer of Care Note  Patient: [name]  Procedure(s) Performed: ENDOSCOPIC RETROGRADE CHOLANGIOPANCREATOGRAPHY (ERCP) WITH PROPOFOL STENT REMOVAL REMOVAL OF STONES  Patient Location: Endoscopy Unit  Anesthesia Type:General  Level of Consciousness: awake and alert   Airway & Oxygen Therapy: Patient Spontanous Breathing  Post-op Assessment: Report given to RN and Post -op Vital signs reviewed and stable  Post vital signs: Reviewed and stable  Last Vitals:  Vitals Value Taken Time  BP 159/71 12/08/22 1450  Temp    Pulse 68 12/08/22 1453  Resp 18 12/08/22 1453  SpO2 97 % 12/08/22 1453  Vitals shown include unvalidated device data.  Last Pain:  Vitals:   12/08/22 1450  TempSrc:   PainSc: 0-No pain         Complications:  Encounter Notable Events  Notable Event Outcome Phase Comment  Difficult to intubate - expected  Intraprocedure Filed from anesthesia note documentation.

## 2022-12-08 NOTE — Anesthesia Procedure Notes (Signed)
Procedure Name: Intubation Date/Time: 12/08/2022 1:42 PM  Performed by: Colin Benton, CRNAPre-anesthesia Checklist: Patient identified, Emergency Drugs available, Suction available and Patient being monitored Patient Re-evaluated:Patient Re-evaluated prior to induction Oxygen Delivery Method: Circle system utilized Preoxygenation: Pre-oxygenation with 100% oxygen Induction Type: IV induction Ventilation: Mask ventilation without difficulty Laryngoscope Size: Glidescope and 4 Grade View: Grade I Tube type: Oral Tube size: 7.5 mm Number of attempts: 1 Airway Equipment and Method: Rigid stylet Placement Confirmation: ETT inserted through vocal cords under direct vision, positive ETCO2 and breath sounds checked- equal and bilateral Secured at: 23 cm Tube secured with: Tape Dental Injury: Teeth and Oropharynx as per pre-operative assessment  Difficulty Due To: Difficult Airway- due to reduced neck mobility and Difficulty was anticipated

## 2022-12-08 NOTE — Plan of Care (Signed)
  Problem: Education: ?Goal: Knowledge of General Education information will improve ?Description: Including pain rating scale, medication(s)/side effects and non-pharmacologic comfort measures ?Outcome: Progressing ?  ?Problem: Health Behavior/Discharge Planning: ?Goal: Ability to manage health-related needs will improve ?Outcome: Progressing ?  ?Problem: Clinical Measurements: ?Goal: Ability to maintain clinical measurements within normal limits will improve ?Outcome: Progressing ?Goal: Will remain free from infection ?Outcome: Progressing ?Goal: Diagnostic test results will improve ?Outcome: Progressing ?Goal: Respiratory complications will improve ?Outcome: Progressing ?Goal: Cardiovascular complication will be avoided ?Outcome: Progressing ?  ?Problem: Activity: ?Goal: Risk for activity intolerance will decrease ?Outcome: Progressing ?  ?Problem: Nutrition: ?Goal: Adequate nutrition will be maintained ?Outcome: Progressing ?  ?Problem: Coping: ?Goal: Level of anxiety will decrease ?Outcome: Progressing ?  ?Problem: Elimination: ?Goal: Will not experience complications related to bowel motility ?Outcome: Progressing ?Goal: Will not experience complications related to urinary retention ?Outcome: Progressing ?  ?Problem: Pain Managment: ?Goal: General experience of comfort will improve ?Outcome: Progressing ?  ?Problem: Safety: ?Goal: Ability to remain free from injury will improve ?Outcome: Progressing ?  ?Problem: Skin Integrity: ?Goal: Risk for impaired skin integrity will decrease ?Outcome: Progressing ?  ?Problem: Education: ?Goal: Knowledge of General Education information will improve ?Description: Including pain rating scale, medication(s)/side effects and non-pharmacologic comfort measures ?Outcome: Progressing ?  ?Problem: Health Behavior/Discharge Planning: ?Goal: Ability to manage health-related needs will improve ?Outcome: Progressing ?  ?Problem: Clinical Measurements: ?Goal: Ability to maintain  clinical measurements within normal limits will improve ?Outcome: Progressing ?Goal: Will remain free from infection ?Outcome: Progressing ?Goal: Diagnostic test results will improve ?Outcome: Progressing ?Goal: Respiratory complications will improve ?Outcome: Progressing ?Goal: Cardiovascular complication will be avoided ?Outcome: Progressing ?  ?Problem: Activity: ?Goal: Risk for activity intolerance will decrease ?Outcome: Progressing ?  ?Problem: Nutrition: ?Goal: Adequate nutrition will be maintained ?Outcome: Progressing ?  ?Problem: Coping: ?Goal: Level of anxiety will decrease ?Outcome: Progressing ?  ?Problem: Elimination: ?Goal: Will not experience complications related to bowel motility ?Outcome: Progressing ?Goal: Will not experience complications related to urinary retention ?Outcome: Progressing ?  ?Problem: Pain Managment: ?Goal: General experience of comfort will improve ?Outcome: Progressing ?  ?Problem: Safety: ?Goal: Ability to remain free from injury will improve ?Outcome: Progressing ?  ?Problem: Skin Integrity: ?Goal: Risk for impaired skin integrity will decrease ?Outcome: Progressing ?  ?

## 2022-12-08 NOTE — Op Note (Signed)
[facility] Patient Name: Francisco [name] Procedure Date : 12/08/2022 MRN: [id number] Attending MD: Ladene Artist , MD, [id number] Date of Birth: [birth year] CSN: [id number] Age: 82 Admit Type: Inpatient Procedure:                ERCP Indications:              Follow-up of bile leak, Elevated liver enzymes,                            Biliary stent removal Providers:                Pricilla Riffle. Fuller Plan, MD, Grace Isaac, RN, Fransico Setters                            [place]bum[place], Technician Referring MD:             [facility] Medicines:                General Anesthesia Complications:            No immediate complications. Estimated Blood Loss:     Estimated blood loss: none. Procedure:                Pre-Anesthesia Assessment:                           - Prior to the procedure, a History and Physical                            was performed, and patient medications and                            allergies were reviewed. The patient's tolerance of                            previous anesthesia was also reviewed. The risks                            and benefits of the procedure and the sedation                            options and risks were discussed with the patient.                            All questions were answered, and informed consent                            was obtained. Prior Anticoagulants: The patient has                            taken no anticoagulant or antiplatelet agents. ASA                            Grade Assessment: III - A patient with severe  systemic disease. After reviewing the risks and                            benefits, the patient was deemed in satisfactory                            condition to undergo the procedure.                           - Prior to the procedure, a History and Physical                            was performed, and patient medications and                            allergies were reviewed. The patient's  tolerance of                            previous anesthesia was also reviewed. The risks                            and benefits of the procedure and the sedation                            options and risks were discussed with the patient.                            All questions were answered, and informed consent                            was obtained. Prior Anticoagulants: The patient has                            taken no anticoagulant or antiplatelet agents. ASA                            Grade Assessment: III - A patient with severe                            systemic disease. After reviewing the risks and                            benefits, the patient was deemed in satisfactory                            condition to undergo the procedure.                           After obtaining informed consent, the scope was                            passed under direct vision. Throughout the  procedure, the patient's blood pressure, pulse, and                            oxygen saturations were monitored continuously. The                            [employer] single use                            duodenoscope was introduced through the mouth, and                            used to inject contrast into and used to inject                            contrast into the bile duct. The ERCP was                            accomplished without difficulty. The patient                            tolerated the procedure well. Scope In: Scope Out: Findings:      The scout film was normal except for the biliary stent. The esophagus       was successfully intubated under direct vision. The scope was advanced       to the major papilla in the descending duodenum without detailed       examination of the pharynx, larynx and associated structures, and upper       GI tract. The upper GI tract was grossly normal except for a clogged       biliary stent and  prior sphincterotomy. A snare was used to remove the       biliary stent. A straight Roadrunner wire was passed into the biliary       tree. The short-nosed traction sphincterotome was passed over the       guidewire and the bile duct was then deeply cannulated. Contrast was       injected. I personally interpreted the bile duct images. Ductal flow of       contrast was adequate. A subtotal cholecystectomy had been performed.       The common bile duct contained filling defects thought to be sludge. The       cystic duct was not filled. The biliary tree was swept several with a       9-12 mm balloon starting at the bifurcation. A moderate amount of sludge       was swept from the duct. The final cholangiogram was clear. No bile leak       was observed. The PD was not cannulated or injected by intention. Impression:               - Biliary stent removed.                           - Prior sphincterotomy.                           - Prior subtotal cholecystectomy.                           -  Moderate amount of CBD sludge.                           - The biliary tree was swept and sludge was found                            and removed. Recommendation:           - Return patient to hospital ward for ongoing care.                           - Observe patient's clinical course following                            today's ERCP with therapeutic intervention.                           - Return to GI office as previously scheduled with                            Dr. Henrene Pastor.                           - Clear liquid diet for 2 hours, then advance as                            tolerated to soft diet today. Procedure Code(s):        --- Professional ---                           [phone], Endoscopic retrograde                            cholangiopancreatography (ERCP); with removal of                            foreign body(s) or stent(s) from biliary/pancreatic                            duct(s)                            43264, Endoscopic retrograde                            cholangiopancreatography (ERCP); with removal of                            calculi/debris from biliary/pancreatic duct(s) Diagnosis Code(s):        --- Professional ---                           Z90.49, Acquired absence of other specified parts                            of digestive tract  Z46.59, Encounter for fitting and adjustment of                            other gastrointestinal appliance and device                           R74.8, Abnormal levels of other serum enzymes                           K83.8, Other specified diseases of biliary tract CPT copyright 2022 [employer]. All rights reserved. The codes documented in this report are preliminary and upon coder review may  be revised to meet current compliance requirements. Ladene Artist, MD 12/08/2022 2:50:37 PM This report has been signed electronically. Number of Addenda: 0

## 2022-12-09 ENCOUNTER — Inpatient Hospital Stay (HOSPITAL_COMMUNITY): Payer: Medicare Other

## 2022-12-09 DIAGNOSIS — R17 Unspecified jaundice: Secondary | ICD-10-CM | POA: Diagnosis not present

## 2022-12-09 DIAGNOSIS — R7989 Other specified abnormal findings of blood chemistry: Secondary | ICD-10-CM | POA: Diagnosis not present

## 2022-12-09 DIAGNOSIS — E43 Unspecified severe protein-calorie malnutrition: Secondary | ICD-10-CM | POA: Insufficient documentation

## 2022-12-09 DIAGNOSIS — N390 Urinary tract infection, site not specified: Secondary | ICD-10-CM | POA: Diagnosis not present

## 2022-12-09 DIAGNOSIS — R627 Adult failure to thrive: Secondary | ICD-10-CM | POA: Diagnosis not present

## 2022-12-09 LAB — COMPREHENSIVE METABOLIC PANEL
ALT: 59 U/L — ABNORMAL HIGH (ref 0–44)
AST: 29 U/L (ref 15–41)
Albumin: 2.5 g/dL — ABNORMAL LOW (ref 3.5–5.0)
Alkaline Phosphatase: 227 U/L — ABNORMAL HIGH (ref 38–126)
Anion gap: 8 (ref 5–15)
BUN: 11 mg/dL (ref 8–23)
CO2: 28 mmol/L (ref 22–32)
Calcium: 8.4 mg/dL — ABNORMAL LOW (ref 8.9–10.3)
Chloride: 99 mmol/L (ref 98–111)
Creatinine, Ser: 1.22 mg/dL (ref 0.61–1.24)
GFR, Estimated: 60 mL/min — ABNORMAL LOW (ref >60)
Glucose, Bld: 105 mg/dL — ABNORMAL HIGH (ref 70–99)
Potassium: 4.2 mmol/L (ref 3.5–5.1)
Sodium: 135 mmol/L (ref 135–145)
Total Bilirubin: 1.1 mg/dL (ref 0.3–1.2)
Total Protein: 5 g/dL — ABNORMAL LOW (ref 6.5–8.1)

## 2022-12-09 LAB — CULTURE, BLOOD (ROUTINE X 2): Special Requests: ADEQUATE

## 2022-12-09 LAB — CBC
HCT: 32 % — ABNORMAL LOW (ref 39.0–52.0)
Hemoglobin: 10.9 g/dL — ABNORMAL LOW (ref 13.0–17.0)
MCH: 32.6 pg (ref 26.0–34.0)
MCHC: 34.1 g/dL (ref 30.0–36.0)
MCV: 95.8 fL (ref 80.0–100.0)
Platelets: 215 10*3/uL (ref 150–400)
RBC: 3.34 MIL/uL — ABNORMAL LOW (ref 4.22–5.81)
RDW: 13.4 % (ref 11.5–15.5)
WBC: 7.8 10*3/uL (ref 4.0–10.5)
nRBC: 0 % (ref 0.0–0.2)

## 2022-12-09 MED ORDER — CEFAZOLIN SODIUM-DEXTROSE 2-4 GM/100ML-% IV SOLN
2.0000 g | Freq: Three times a day (TID) | INTRAVENOUS | Status: DC
  Administered 2022-12-10 – 2022-12-14 (×13): 2 g via INTRAVENOUS
  Filled 2022-12-09 (×13): qty 100

## 2022-12-09 NOTE — Progress Notes (Addendum)
Daily Rounding Note  12/09/2022, 2:25 PM  LOS: 2 days   SUBJECTIVE:   Chief complaint:  dysphagia    Has had clear liquids and swallowing these without problems.  No shortness of breath.  No cough.  OBJECTIVE:         Vital signs in last 24 hours:    Temp:  [97.5 F (36.4 C)-98.8 F (37.1 C)] 98.2 F (36.8 C) (01/24 0830) Pulse Rate:  [53-68] 60 (01/24 1126) Resp:  [14-20] 16 (01/24 0830) BP: (128-179)/(60-82) 146/82 (01/24 1126) SpO2:  [96 %-99 %] 98 % (01/24 0830) Last BM Date : 12/05/22 Filed Weights   12/07/22 0058 12/07/22 1302  Weight: 77.1 kg 74.7 kg   General: Pale, chronically unwell appearing but alert, comfortable.  Does not appear toxic. Heart: RRR. Chest: No labored breathing, no cough, no wet vocal quality or sound of phlegm in the back of his throat.  Lungs are clear bilaterally. Abdomen: Soft without tenderness. Extremities: No CCE. Neuro/Psych: Appropriate.  No gross deficits.  Intake/Output from previous day: 01/23 0701 - 01/24 0700 In: 3323.4 [P.O.:240; I.V.:2883.4; IV Piggyback:200] Out: 675 [Urine:675]  Intake/Output this shift: Total I/O In: -  Out: 240 [Urine:240]  Lab Results: Recent Labs    12/07/22 0111 12/08/22 0653 12/09/22 0407  WBC 15.8* 7.4 7.8  HGB 12.2* 10.5* 10.9*  HCT 37.5* 32.3* 32.0*  PLT 246 206 215   BMET Recent Labs    12/07/22 0111 12/08/22 0653 12/09/22 0407  NA 136 135 135  K 4.0 4.0 4.2  CL 100 101 99  CO2 '24 25 28  '$ GLUCOSE 218* 127* 105*  BUN '20 12 11  '$ CREATININE 1.88* 1.22 1.22  CALCIUM 8.9 8.4* 8.4*   LFT Recent Labs    12/07/22 0111 12/08/22 0653 12/09/22 0407  PROT 6.6 5.2* 5.0*  ALBUMIN 3.2* 2.5* 2.5*  AST 90* 38 29  ALT 130* 74* 59*  ALKPHOS 411* 264* 227*  BILITOT 3.9* 1.3* 1.1   PT/INR No results for input(s): "LABPROT", "INR" in the last 72 hours. Hepatitis Panel No results for input(s): "HEPBSAG", "HCVAB", "HEPAIGM",  "HEPBIGM" in the last 72 hours.  Studies/Results: DG ESOPHAGUS W SINGLE CM (SOL OR THIN BA)  Result Date: 12/09/2022 CLINICAL DATA:  Provided history: Dysphagia. Additional history provided: Recent endoscopy and esophageal dilation. EXAM: ESOPHAGUS/BARIUM SWALLOW/TABLET STUDY TECHNIQUE: A single contrast examination was performed using Omnipaque 300. Additionally, the patient swallowed a 13 mm barium tablet under fluoroscopy. The procedure was performed by Reatha Armour, PA-C, and was supervised and interpreted by Dr. Kellie Simmering. FLUOROSCOPY: Fluoroscopy time: 1 minute, 36 seconds (8.5 mGy). COMPARISON:  Chest CT 05/05/2022. FINDINGS: Examination somewhat limited by the patient's limited ability to reposition on the fluoroscopy table. Narrowed appearance of the distal esophagus. A swallowed 13 mm barium tablet did not pass beyond this point despite a prolonged period of observation. Prominent esophageal dysmotility with tertiary contractions. Small hiatal hernia. Small-volume gastroesophageal reflux observed to the level of the lower esophagus. IMPRESSION: 1. Examination somewhat limited by the patient's limited ability to reposition on the fluoroscopy table. 2. Narrowed appearance of the distal esophagus. A swallowed 13 mm barium tablet did not pass beyond this point despite a prolonged period of observation. 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 Correlate with findings on reported recent endoscopy. 3. Prominent esophageal dysmotility. 4. Small hiatal hernia. 5. Small-volume gastroesophageal reflux observed to the level of the lower esophagus Electronically Signed  By: Kellie Simmering D.O.   On: 12/09/2022 11:31   DG ERCP  Result Date: 12/08/2022 CLINICAL DATA:  ERCP with stent and stone removal. History of prior cholecystectomy with bile leak. EXAM: ERCP TECHNIQUE: Multiple spot images obtained with the fluoroscopic device and submitted for interpretation post-procedure.  FLUOROSCOPY TIME: FLUOROSCOPY TIME 2 minutes, 58 seconds (33.5 mGy) COMPARISON:  07/09/2022; CT abdomen pelvis-07/12/2022 Nuclear medicine HIDA scan-07/07/2022 FINDINGS: Thirteen spot intraoperative fluoroscopic images of the right upper abdominal quadrant during ERCP are provided for review. Initial image demonstrates ERCP probe overlying the right upper abdominal quadrant. An internal plastic biliary stent overlies the expected location of the distal aspect of the CBD. Cholecystectomy clips overlies expected location of the gallbladder fossa. Subsequent image demonstrates removal of the internal biliary stent with subsequent cannulation and opacification of the common bile duct which appears moderately dilated. Subsequent images demonstrate insufflation of a balloon within the central aspect of the CBD with subsequent sheath cholangiogram, presumed biliary sweeping and sphincterotomy. The common bile duct is moderately dilated as is the opacified portion of the intrahepatic biliary tree. There is no definitive opacification of the pancreatic duct. There is suspected faint opacification of the residual cystic duct without definitive evidence of a bile leak. IMPRESSION: 1. ERCP with biliary stent removal, balloon cholangiogram with subsequent sweeping and presumed sphincterotomy. 2. Faint opacification of the residual cystic duct without definitive evidence of a residual bile leak. These images were submitted for radiologic interpretation only. Please see the procedural report for the amount of contrast and the fluoroscopy time utilized. Electronically Signed   By: Sandi Mariscal M.D.   On: 12/08/2022 15:06    ASSESMENT:   Dysphagia. 12/08/2022 EGD.  No esophageal abnormality to explain dysphagia.  Esophagus was empirically dilated.  3 cm hiatal hernia present, biliary stent visualized in duodenum. Esophagram today: Limited study due to patient difficulty repositioning himself.  Narrowing at distal esophagus.   Swallowed 13 mm tablet did not pass beyond GE junction despite prolonged period of observation.  Findings suggest distal esophageal stricture.  Prominent esophageal dysmotility.  Small HH.  Small volume GERD to level of lower esophagus. SLP bedside swallow study 1/22.  Challenged with pures, full liquids.  Patient felt that these were getting stuck at the time.  Their recommendation, prior to esophageal dilatation, was to continue full liquids.  Biliary leak requiring stent placement 06/2019 after subtotal colectomy. ERCP 12/08/2022.  Biliary stent removed.  Dr. Fuller Plan swept CBD of sludge.  Klebsiella pneumonia, septicemia.  UTI.  AKI.  PLAN   Challenge patient with mechanical soft diet.    Azucena Freed  12/09/2022, 2:25 PM Phone [phone]   Attending physician's note   I have taken a history, reviewed the chart and examined the patient. I performed a substantive portion of this encounter, including complete performance of at least one of the key components, in conjunction with the APP. I agree with the APP's note, impression and recommendations.    He feels swallowing is better s/p EGD with dilation  Reviewed barium esophagram, no evidence stricture on EGD.  Possible esophageal spasm or dysmotility.  If his swallowing is improving, do not recommend further testing  Advance diet to soft mechanical diet  Status recent ERCP with removal of biliary stent, no bile leak, had sludge in the bile duct that was cleared LFT are trending down No abdominal pain Continue ceftriaxone for 5 days  Follow-up in GI office as already scheduled for later this month  GI  will sign off, available if have any questions  The patient was provided an opportunity to ask questions and all were answered. The patient agreed with the plan and demonstrated an understanding of the instructions.   Damaris Hippo , MD [phone]

## 2022-12-09 NOTE — Progress Notes (Signed)
Physical Therapy Treatment Patient Details Name: Francisco [name] MRN: [id number] DOB: 1941/04/15 Today's Date: 12/09/2022   History of Present Illness Patient is 82 y.o. male presenting with weakness and c/o difficulty swallowing as well as emesis and rigors... PMH significant of HTN, HLD, stage 3a CKD, and BPH. Recently he was last admitted from 10/3-4 with C4 cervical fracture s/p fusion. Pt reports decline since fusion and difficulty eating/swallowing. Prior to that, he was admitted in June for E coli bacteremia and from 8/20-29 with acute on chronic cholecystitis requiring a laparascopic subtotal fenestrating cholecystectomy with drain placement.    PT Comments    Continuing work on functional mobility and activity tolerance;  Session focused on functional transfers; Mod assist to stand initially due to posterior bias, corrected with cues; Assisted pt bed to [facility] to recliner, where he continued his lunch;    I'm concerned about pt's fall risk at this time; he lives alone, daughters care for him in shifts; We discussed SNF for post-acute rehab to maximize independence and safety with mobility and ADLs with the clear goal of getting home; Given FTT, he would benefit from a few weeks of focused inpt rehab with ST, OT, and PT disciplines; he is hesitant, but sounds like he'll consider; encouraged pt to speak with his daughters   Recommendations for follow up therapy are one component of a multi-disciplinary discharge planning process, led by the attending physician.  Recommendations may be updated based on patient status, additional functional criteria and insurance authorization.  Follow Up Recommendations  Skilled nursing-short term rehab (<3 hours/day) (pt hesitant, but will consider) Can patient physically be transported by private vehicle: Yes   Assistance Recommended at Discharge Intermittent Supervision/Assistance  Patient can return home with the following A little help with walking and/or  transfers;A little help with bathing/dressing/bathroom;Assistance with cooking/housework;Help with stairs or ramp for entrance   Equipment Recommendations  None recommended by PT (pretty well-equipped)    Recommendations for Other Services       Precautions / Restrictions Precautions Precautions: Fall Restrictions Weight Bearing Restrictions: No     Mobility  Bed Mobility Overal bed mobility: Needs Assistance Bed Mobility: Supine to Sit       Sit to supine: Supervision   General bed mobility comments: cued for log roll, and patient reaching for side rails.    Transfers Overall transfer level: Needs assistance Equipment used: 1 person hand held assist Transfers: Sit to/from Stand, Bed to chair/wheelchair/BSC Sit to Stand: Mod assist   Step pivot transfers: Min assist       General transfer comment: Posterior bias with initial stand, leading to unplanned sit back to the bed; second bout with multimodal cues to initiate with anterior weight shift; Min assist to steady and support with pivotal steps bed to [facility], and then BSC to recliner    Ambulation/Gait               General Gait Details: Deferred, wanting to get to recliner for lunch, had walked with Mob Team earlier   [employer] Mobility    Modified Rankin (Stroke Patients Only)       Balance     Sitting balance-Leahy Scale: Good       Standing balance-Leahy Scale: Poor                              Cognition Arousal/Alertness:  Awake/alert Behavior During Therapy: WFL for tasks assessed/performed Overall Cognitive Status: Within Functional Limits for tasks assessed                                          Exercises      General Comments General comments (skin integrity, edema, etc.): No difficulty voiding at [facility]      Pertinent Vitals/Pain Pain Assessment Pain Assessment: No/denies pain    Home Living                           Prior Function            PT Goals (current goals can now be found in the care plan section) Acute Rehab PT Goals Patient Stated Goal: be able to eat easily and keep strength PT Goal Formulation: With patient Time For Goal Achievement: 12/21/22 Potential to Achieve Goals: Good Progress towards PT goals: Progressing toward goals    Frequency    Min 3X/week (keep 3x/wk; might decline SNF)      PT Plan Discharge plan needs to be updated    Co-evaluation              AM-PAC PT "6 Clicks" Mobility   Outcome Measure  Help needed turning from your back to your side while in a flat bed without using bedrails?: A Little Help needed moving from lying on your back to sitting on the side of a flat bed without using bedrails?: A Little Help needed moving to and from a bed to a chair (including a wheelchair)?: A Little Help needed standing up from a chair using your arms (e.g., wheelchair or bedside chair)?: A Little Help needed to walk in hospital room?: A Little Help needed climbing 3-5 steps with a railing? : A Little 6 Click Score: 18    End of Session Equipment Utilized During Treatment: Gait belt Activity Tolerance: Patient tolerated treatment well Patient left: in chair;with call bell/phone within reach;Other (comment) (chair alarm pad under pt (needs chair alarm box) Nurse Communication: Mobility status PT Visit Diagnosis: Difficulty in walking, not elsewhere classified (R26.2);Unsteadiness on feet (R26.81)     Time: 3491-7915 PT Time Calculation (min) (ACUTE ONLY): 26 min  Charges:  $Therapeutic Activity: 23-37 mins                     Roney Marion, PT  Acute Rehabilitation Services Office [phone]    [place]letta [place] 12/09/2022, 3:29 PM

## 2022-12-09 NOTE — Progress Notes (Signed)
PROGRESS NOTE    Francisco [name]  XTG:[id number] DOB: [birth year] DOA: 12/07/2022 PCP: [name], FNP   Brief Narrative: 82 year old with past medical history significant for hypertension, hyperlipidemia, CKD stage IIIa, BPH who presented to [name] ED on 1/22 with complaints of generalized weakness, dysphagia with associated nausea vomiting.  Also reported rigors and urinary frequency and dysuria the day prior to admission.  He was admitted August 2023 with acute on chronic cholecystitis requiring laparoscopic cholecystectomy with drain placement complicated by bile leak postoperative required placement of a CBD stent via ERCP.  He presented with transaminases, bilirubin 3.9, abdominal ultrasound cholecystectomy without biliary dilation.  Patient was found to have Klebsiella pneumoniae bacteremia, sepsis and urinary tract infection.  Worsening transaminases and underwent ERCP on 1/23, biliary stent was removed, prior sphincterectomy, prior subtotal cholecystectomy, moderate amount of CBD sludge, biliary tree was swept and sludge was found and removed.   Assessment & Plan:   Principal Problem:   Failure to thrive in adult Active Problems:   Essential hypertension   BPH (benign prostatic hyperplasia)   GAD (generalized anxiety disorder)   UTI (urinary tract infection)   Elevated LFTs   Acute kidney injury superimposed on chronic kidney disease (HCC)   Dysphagia   Dyslipidemia   Weight loss   Protein-calorie malnutrition, severe  1-Klebsiella pneumonia  bacteremia, sepsis POA Urinary tract infection: -Patient presented with weakness, urinary symptoms, UA positive nitrate, blood cultures positive for Klebsiella pneumoniae Antibiotics changed to Ancef per sensitivity He will need a total of 10 to 14 days treatment, he can be discharged on oral antibiotics  2-Transaminases, hyperbilirubinemia:  History of Subtotal Cholecystectomy, status post ERCP with biliary stent  placement for postoperative bile leak on 06/2022. -Presented with transaminases hyperbilirubinemia -Continue to hold statins -Underwent ERCP 1/23: Biliary stent was removed, prior sphincterectomy, prior subtotal cholecystectomy, moderate amount of CBD sludge, biliary tree was swept and sludge was found and removed. -Liver function test trending down -Appreciate  GI evaluation  Acute renal failure on CKD stage IIIa: Likely secondary to prerenal azotemia in the setting of dehydration versus ATN Creatinine peaked to 1.8--- 10 down to 1.2 Fluids  Dysphagia/failure to thrive And reports difficulty eating solids and sometimes liquids. Underwent EGD 1/23 with no significant finding but dilation perform Barium esophagogram 1/24: Limited study,  Narrowed appearance of the distal esophagus. A swallowed 13 mm barium tablet did not pass beyond this point despite a prolonged period of observation. 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 Soft diet trial.  On PPI.    Severe protein malnutrition: Continue with Ensure   Nutrition Problem: Severe Malnutrition Etiology: chronic illness    Signs/Symptoms: energy intake < 75% for > or equal to 1 month, percent weight loss Percent weight loss: 19 % (over 5 months)    Interventions: Ensure Enlive (each supplement provides 350kcal and 20 grams of protein)  Estimated body mass index is 24.32 kg/m as calculated from the following:   Height as of this encounter: '5\' 9"'$  (1.753 m).   Weight as of this encounter: 74.7 kg.   DVT prophylaxis: SCDs Code Status: DNR Family Communication: Care discussed with patient Disposition Plan:  Status is: Inpatient Remains inpatient appropriate because: Plan to resume diet, PT recommending skilled benefit from skilled rehab    Consultants:  GI  Procedures:  ERCP  Antimicrobials:    Subjective: He is alert, feeling better than before. Denies  abdominal pain, willing to try diet.  Objective: Vitals:   12/08/22 2144 12/09/22 0520 12/09/22 0830 12/09/22 1126  BP: (!) 143/68 132/60 (!) 179/73 (!) 146/82  Pulse: (!) 53 60 (!) 59 60  Resp: '18 18 16   '$ Temp: (!) 97.5 F (36.4 C) 97.6 F (36.4 C) 98.2 F (36.8 C)   TempSrc: Oral Oral Oral   SpO2: 99% 98% 98%   Weight:      Height:        Intake/Output Summary (Last 24 hours) at 12/09/2022 1628 Last data filed at 12/09/2022 1300 Gross per 24 hour  Intake 3309.17 ml  Output 740 ml  Net 2569.17 ml   Filed Weights   12/07/22 0058 12/07/22 1302  Weight: 77.1 kg 74.7 kg    Examination:  General exam: Appears calm and comfortable  Respiratory system: Clear to auscultation. Respiratory effort normal. Cardiovascular system: S1 & S2 heard, RRR.  Gastrointestinal system: Abdomen is nondistended, soft and nontender. No organomegaly or masses felt. Normal bowel sounds heard. Central nervous system: Alert and oriented.  Extremities: Symmetric 5 x 5 power.   Data Reviewed: I have personally reviewed following labs and imaging studies  CBC: Recent Labs  Lab 12/07/22 0111 12/08/22 0653 12/09/22 0407  WBC 15.8* 7.4 7.8  NEUTROABS 14.8*  --   --   HGB 12.2* 10.5* 10.9*  HCT 37.5* 32.3* 32.0*  MCV 97.7 97.6 95.8  PLT 246 206 818   Basic Metabolic Panel: Recent Labs  Lab 12/07/22 0111 12/08/22 0653 12/09/22 0407  NA 136 135 135  K 4.0 4.0 4.2  CL 100 101 99  CO2 '24 25 28  '$ GLUCOSE 218* 127* 105*  BUN '20 12 11  '$ CREATININE 1.88* 1.22 1.22  CALCIUM 8.9 8.4* 8.4*   GFR: Estimated Creatinine Clearance: 47.5 mL/min (by C-G formula based on SCr of 1.22 mg/dL). Liver Function Tests: Recent Labs  Lab 12/07/22 0111 12/08/22 0653 12/09/22 0407  AST 90* 38 29  ALT 130* 74* 59*  ALKPHOS 411* 264* 227*  BILITOT 3.9* 1.3* 1.1  PROT 6.6 5.2* 5.0*  ALBUMIN 3.2* 2.5* 2.5*   No results for input(s): "LIPASE", "AMYLASE" in the last 168 hours. No results for input(s):  "AMMONIA" in the last 168 hours. Coagulation Profile: No results for input(s): "INR", "PROTIME" in the last 168 hours. Cardiac Enzymes: No results for input(s): "CKTOTAL", "CKMB", "CKMBINDEX", "TROPONINI" in the last 168 hours. BNP (last 3 results) No results for input(s): "PROBNP" in the last 8760 hours. HbA1C: No results for input(s): "HGBA1C" in the last 72 hours. CBG: No results for input(s): "GLUCAP" in the last 168 hours. Lipid Profile: No results for input(s): "CHOL", "HDL", "LDLCALC", "TRIG", "CHOLHDL", "LDLDIRECT" in the last 72 hours. Thyroid Function Tests: No results for input(s): "TSH", "T4TOTAL", "FREET4", "T3FREE", "THYROIDAB" in the last 72 hours. Anemia Panel: No results for input(s): "VITAMINB12", "FOLATE", "FERRITIN", "TIBC", "IRON", "RETICCTPCT" in the last 72 hours. Sepsis Labs: Recent Labs  Lab 12/07/22 0111  LATICACIDVEN 1.2    Recent Results (from the past 240 hour(s))  Blood culture (routine x 2)     Status: Abnormal   Collection Time: 12/07/22  1:11 AM   Specimen: BLOOD RIGHT ARM  Result Value Ref Range Status   Specimen Description BLOOD RIGHT ARM  Final   Special Requests   Final    BOTTLES DRAWN AEROBIC AND ANAEROBIC Blood Culture results may not be optimal due to an excessive volume of blood received in culture bottles   Culture  Setup Time   Final  ANAEROBIC BOTTLE ONLY GRAM NEGATIVE RODS CRITICAL RESULT CALLED TO, READ BACK BY AND VERIFIED WITH:  C/ PHARMD E. MARTIN 12/07/22 1518 A. LAFRANCE    Culture KLEBSIELLA PNEUMONIAE (A)  Final   Report Status 12/09/2022 FINAL  Final   Organism ID, Bacteria KLEBSIELLA PNEUMONIAE  Final      Susceptibility   Klebsiella pneumoniae - MIC*    AMPICILLIN RESISTANT Resistant     CEFAZOLIN <=4 SENSITIVE Sensitive     CEFEPIME <=0.12 SENSITIVE Sensitive     CEFTAZIDIME <=1 SENSITIVE Sensitive     CEFTRIAXONE <=0.25 SENSITIVE Sensitive     CIPROFLOXACIN <=0.25 SENSITIVE Sensitive     GENTAMICIN <=1  SENSITIVE Sensitive     IMIPENEM <=0.25 SENSITIVE Sensitive     TRIMETH/SULFA <=20 SENSITIVE Sensitive     AMPICILLIN/SULBACTAM 4 SENSITIVE Sensitive     PIP/TAZO <=4 SENSITIVE Sensitive     * KLEBSIELLA PNEUMONIAE  Urine Culture     Status: Abnormal   Collection Time: 12/07/22  1:11 AM   Specimen: Urine, Clean Catch  Result Value Ref Range Status   Specimen Description URINE, CLEAN CATCH  Final   Special Requests NONE  Final   Culture (A)  Final    <10,000 COLONIES/mL INSIGNIFICANT GROWTH Performed at [facility], [address], [place], [place] [address]    Report Status 12/08/2022 FINAL  Final  Resp panel by RT-PCR (RSV, Flu A&B, Covid) Anterior Nasal Swab     Status: None   Collection Time: 12/07/22  1:11 AM   Specimen: Anterior Nasal Swab  Result Value Ref Range Status   SARS Coronavirus 2 by RT PCR NEGATIVE NEGATIVE Final    Comment: (NOTE) SARS-CoV-2 target nucleic acids are NOT DETECTED.  The SARS-CoV-2 RNA is generally detectable in upper respiratory specimens during the acute phase of infection. The lowest concentration of SARS-CoV-2 viral copies this assay can detect is 138 copies/mL. A negative result does not preclude SARS-Cov-2 infection and should not be used as the sole basis for treatment or other patient management decisions. A negative result may occur with  improper specimen collection/handling, submission of specimen other than nasopharyngeal swab, presence of viral mutation(s) within the areas targeted by this assay, and inadequate number of viral copies(<138 copies/mL). A negative result must be combined with clinical observations, patient history, and epidemiological information. The expected result is Negative.  Fact Sheet for Patients:  EntrepreneurPulse.com.au  Fact Sheet for Healthcare Providers:  IncredibleEmployment.be  This test is no t yet approved or cleared by the [place] FDA and  has been  authorized for detection and/or diagnosis of SARS-CoV-2 by FDA under an Emergency Use Authorization (EUA). This EUA will remain  in effect (meaning this test can be used) for the duration of the COVID-19 declaration under Section 564(b)(1) of the Act, 21 U.S.C.section 360bbb-3(b)(1), unless the authorization is terminated  or revoked sooner.       Influenza A by PCR NEGATIVE NEGATIVE Final   Influenza B by PCR NEGATIVE NEGATIVE Final    Comment: (NOTE) The Xpert Xpress SARS-CoV-2/FLU/RSV plus assay is intended as an aid in the diagnosis of influenza from Nasopharyngeal swab specimens and should not be used as a sole basis for treatment. Nasal washings and aspirates are unacceptable for Xpert Xpress SARS-CoV-2/FLU/RSV testing.  Fact Sheet for Patients: EntrepreneurPulse.com.au  Fact Sheet for Healthcare Providers: IncredibleEmployment.be  This test is not yet approved or cleared by the [place] FDA and has been authorized for detection and/or diagnosis of  SARS-CoV-2 by FDA under an Emergency Use Authorization (EUA). This EUA will remain in effect (meaning this test can be used) for the duration of the COVID-19 declaration under Section 564(b)(1) of the Act, 21 U.S.C. section 360bbb-3(b)(1), unless the authorization is terminated or revoked.     Resp Syncytial Virus by PCR NEGATIVE NEGATIVE Final    Comment: (NOTE) Fact Sheet for Patients: EntrepreneurPulse.com.au  Fact Sheet for Healthcare Providers: IncredibleEmployment.be  This test is not yet approved or cleared by the [place] FDA and has been authorized for detection and/or diagnosis of SARS-CoV-2 by FDA under an Emergency Use Authorization (EUA). This EUA will remain in effect (meaning this test can be used) for the duration of the COVID-19 declaration under Section 564(b)(1) of the Act, 21 U.S.C. section 360bbb-3(b)(1), unless the  authorization is terminated or revoked.  Performed at [facility], [place] [address], [place], [place] [address]   Blood Culture ID Panel (Reflexed)     Status: Abnormal   Collection Time: 12/07/22  1:11 AM  Result Value Ref Range Status   Enterococcus faecalis NOT DETECTED NOT DETECTED Final   Enterococcus Faecium NOT DETECTED NOT DETECTED Final   Listeria monocytogenes NOT DETECTED NOT DETECTED Final   Staphylococcus species NOT DETECTED NOT DETECTED Final   Staphylococcus aureus (BCID) NOT DETECTED NOT DETECTED Final   Staphylococcus epidermidis NOT DETECTED NOT DETECTED Final   Staphylococcus lugdunensis NOT DETECTED NOT DETECTED Final   Streptococcus species NOT DETECTED NOT DETECTED Final   Streptococcus agalactiae NOT DETECTED NOT DETECTED Final   Streptococcus pneumoniae NOT DETECTED NOT DETECTED Final   Streptococcus pyogenes NOT DETECTED NOT DETECTED Final   A.calcoaceticus-baumannii NOT DETECTED NOT DETECTED Final   Bacteroides fragilis NOT DETECTED NOT DETECTED Final   Enterobacterales DETECTED (A) NOT DETECTED Final    Comment: Enterobacterales represent a large order of gram negative bacteria, not a single organism. CRITICAL RESULT CALLED TO, READ BACK BY AND VERIFIED WITH:  C/ PHARMD E. MARTIN 12/07/22 1518 A. LAFRANCE    Enterobacter cloacae complex NOT DETECTED NOT DETECTED Final   Escherichia coli NOT DETECTED NOT DETECTED Final   Klebsiella aerogenes NOT DETECTED NOT DETECTED Final   Klebsiella oxytoca NOT DETECTED NOT DETECTED Final   Klebsiella pneumoniae DETECTED (A) NOT DETECTED Final    Comment: CRITICAL RESULT CALLED TO, READ BACK BY AND VERIFIED WITH:  C/ PHARMD E. MARTIN 12/07/22 1518 A. LAFRANCE    Proteus species NOT DETECTED NOT DETECTED Final   Salmonella species NOT DETECTED NOT DETECTED Final   Serratia marcescens NOT DETECTED NOT DETECTED Final   Haemophilus influenzae NOT DETECTED NOT DETECTED Final   Neisseria meningitidis NOT DETECTED  NOT DETECTED Final   Pseudomonas aeruginosa NOT DETECTED NOT DETECTED Final   Stenotrophomonas maltophilia NOT DETECTED NOT DETECTED Final   Candida albicans NOT DETECTED NOT DETECTED Final   Candida auris NOT DETECTED NOT DETECTED Final   Candida glabrata NOT DETECTED NOT DETECTED Final   Candida krusei NOT DETECTED NOT DETECTED Final   Candida parapsilosis NOT DETECTED NOT DETECTED Final   Candida tropicalis NOT DETECTED NOT DETECTED Final   Cryptococcus neoformans/gattii NOT DETECTED NOT DETECTED Final   CTX-M ESBL NOT DETECTED NOT DETECTED Final   Carbapenem resistance IMP NOT DETECTED NOT DETECTED Final   Carbapenem resistance KPC NOT DETECTED NOT DETECTED Final   Carbapenem resistance NDM NOT DETECTED NOT DETECTED Final   Carbapenem resist OXA 48 LIKE NOT DETECTED NOT DETECTED Final   Carbapenem resistance VIM NOT  DETECTED NOT DETECTED Final    Comment: Performed at [facility], [place] [address], [place], [place] [address]  Blood culture (routine x 2)     Status: Abnormal   Collection Time: 12/07/22  3:07 AM   Specimen: BLOOD LEFT HAND  Result Value Ref Range Status   Specimen Description BLOOD LEFT HAND  Final   Special Requests   Final    BOTTLES DRAWN AEROBIC AND ANAEROBIC Blood Culture adequate volume   Culture  Setup Time   Final    GRAM NEGATIVE RODS IN BOTH AEROBIC AND ANAEROBIC BOTTLES CRITICAL RESULT CALLED TO, READ BACK BY AND VERIFIED WITH: PHARMD KAILEY MIKOLICHEK ON 1/91/47 @ 8295 BY DRT    Culture (A)  Final    KLEBSIELLA PNEUMONIAE SUSCEPTIBILITIES PERFORMED ON PREVIOUS CULTURE WITHIN THE LAST 5 DAYS. Performed at [facility], [place] [address], [place], [place] [address]    Report Status 12/09/2022 FINAL  Final         Radiology Studies: DG ESOPHAGUS W SINGLE CM (SOL OR THIN BA)  Result Date: 12/09/2022 CLINICAL DATA:  Provided history: Dysphagia. Additional history provided: Recent endoscopy and esophageal dilation. EXAM: ESOPHAGUS/BARIUM  SWALLOW/TABLET STUDY TECHNIQUE: A single contrast examination was performed using Omnipaque 300. Additionally, the patient swallowed a 13 mm barium tablet under fluoroscopy. The procedure was performed by Reatha Armour, PA-C, and was supervised and interpreted by Dr. Kellie Simmering. FLUOROSCOPY: Fluoroscopy time: 1 minute, 36 seconds (8.5 mGy). COMPARISON:  Chest CT 05/05/2022. FINDINGS: Examination somewhat limited by the patient's limited ability to reposition on the fluoroscopy table. Narrowed appearance of the distal esophagus. A swallowed 13 mm barium tablet did not pass beyond this point despite a prolonged period of observation. Prominent esophageal dysmotility with tertiary contractions. Small hiatal hernia. Small-volume gastroesophageal reflux observed to the level of the lower esophagus. IMPRESSION: 1. Examination somewhat limited by the patient's limited ability to reposition on the fluoroscopy table. 2. Narrowed appearance of the distal esophagus. A swallowed 13 mm barium tablet did not pass beyond this point despite a prolonged period of observation. 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 Correlate with findings on reported recent endoscopy. 3. Prominent esophageal dysmotility. 4. Small hiatal hernia. 5. Small-volume gastroesophageal reflux observed to the level of the lower esophagus Electronically Signed   By: Kellie Simmering D.O.   On: 12/09/2022 11:31   DG ERCP  Result Date: 12/08/2022 CLINICAL DATA:  ERCP with stent and stone removal. History of prior cholecystectomy with bile leak. EXAM: ERCP TECHNIQUE: Multiple spot images obtained with the fluoroscopic device and submitted for interpretation post-procedure. FLUOROSCOPY TIME: FLUOROSCOPY TIME 2 minutes, 58 seconds (33.5 mGy) COMPARISON:  07/09/2022; CT abdomen pelvis-07/12/2022 Nuclear medicine HIDA scan-07/07/2022 FINDINGS: Thirteen spot intraoperative fluoroscopic images of the right upper abdominal quadrant during  ERCP are provided for review. Initial image demonstrates ERCP probe overlying the right upper abdominal quadrant. An internal plastic biliary stent overlies the expected location of the distal aspect of the CBD. Cholecystectomy clips overlies expected location of the gallbladder fossa. Subsequent image demonstrates removal of the internal biliary stent with subsequent cannulation and opacification of the common bile duct which appears moderately dilated. Subsequent images demonstrate insufflation of a balloon within the central aspect of the CBD with subsequent sheath cholangiogram, presumed biliary sweeping and sphincterotomy. The common bile duct is moderately dilated as is the opacified portion of the intrahepatic biliary tree. There is no definitive opacification of the pancreatic duct. There is suspected  faint opacification of the residual cystic duct without definitive evidence of a bile leak. IMPRESSION: 1. ERCP with biliary stent removal, balloon cholangiogram with subsequent sweeping and presumed sphincterotomy. 2. Faint opacification of the residual cystic duct without definitive evidence of a residual bile leak. These images were submitted for radiologic interpretation only. Please see the procedural report for the amount of contrast and the fluoroscopy time utilized. Electronically Signed   By: Sandi Mariscal M.D.   On: 12/08/2022 15:06        Scheduled Meds:  aspirin  81 mg Oral Daily   escitalopram  20 mg Oral Daily   feeding supplement  237 mL Oral TID BM   metoprolol succinate  25 mg Oral BID   mouth rinse  15 mL Mouth Rinse 4 times per day   pantoprazole  40 mg Oral Daily   tamsulosin  0.4 mg Oral Daily   Continuous Infusions:  sodium chloride Stopped (12/08/22 0624)   [START ON 12/10/2022]  ceFAZolin (ANCEF) IV     lactated ringers 100 mL/hr at 12/09/22 1123   metronidazole 500 mg (12/09/22 1130)     LOS: 2 days    Time spent: 35 minutes    Maisen Klingler A Janera Peugh, MD [facility]   If 7PM-7AM, please contact night-coverage [email or web address]  12/09/2022, 4:28 PM

## 2022-12-09 NOTE — Progress Notes (Signed)
Mobility Specialist Progress Note:    12/09/22 0930  Mobility  Activity Ambulated with assistance in hallway  Level of Assistance Minimal assist, patient does 75% or more  Assistive Device Front wheel walker  Distance Ambulated (ft) 75 ft  Activity Response Tolerated well  Mobility Referral Yes  $Mobility charge 1 Mobility   Pt was agreeable for mobility session. Required cues for RW safety when standing. Tolerated well, asx throughout. Returned to bed with all needs met.   Royetta Crochet Mobility Specialist Please contact via Solicitor or  Rehab office at [phone]

## 2022-12-09 NOTE — Anesthesia Postprocedure Evaluation (Signed)
Anesthesia Post Note  Patient: Francisco [name]  Procedure(s) Performed: ESOPHAGOGASTRODUODENOSCOPY (EGD) WITH PROPOFOL Balloon dilation wire-guided     Patient location during evaluation: Endoscopy Anesthesia Type: MAC Level of consciousness: awake and patient cooperative Pain management: pain level controlled Vital Signs Assessment: post-procedure vital signs reviewed and stable Respiratory status: spontaneous breathing, nonlabored ventilation, respiratory function stable and patient connected to nasal cannula oxygen Cardiovascular status: stable Postop Assessment: no apparent nausea or vomiting Anesthetic complications: no   No notable events documented.  Last Vitals:  Vitals:   12/08/22 2144 12/09/22 0520  BP: (!) 143/68 132/60  Pulse: (!) 53 60  Resp: 18 18  Temp: (!) 36.4 C 36.4 C  SpO2: 99% 98%    Last Pain:  Vitals:   12/09/22 0520  TempSrc: Oral  PainSc:                  Francisco [name]

## 2022-12-10 ENCOUNTER — Inpatient Hospital Stay (HOSPITAL_COMMUNITY): Payer: Medicare Other

## 2022-12-10 DIAGNOSIS — R627 Adult failure to thrive: Secondary | ICD-10-CM | POA: Diagnosis not present

## 2022-12-10 NOTE — Progress Notes (Signed)
Speech Language Pathology Treatment: Dysphagia  Patient Details Name: Francisco [name] MRN: [id number] DOB: 06-May-1941 Today's Date: 12/10/2022 Time: 3536-1443 SLP Time Calculation (min) (ACUTE ONLY): 18 min  Assessment / Plan / Recommendation Clinical Impression  Pt was seen for dysphagia treatment. Pt was educated regarding the results of the modified barium swallow study, diet recommendations, and swallowing precautions. Video recording of the study was used to facilitate education; he verbalized understanding regarding all areas of education and demonstrated it using teach back. Pt was educated on the need for therapy to improve pharyngeal and respiratory muscle strength. He verbalized understanding and agreement and expressed interest in [facility] for rehab; pt's desire was relayed to [facility].  All of the pt's questions were answered to his satisfaction. A wet vocal quality was intermittently noted (likely secondary to liquids aspirated during the MBS which were not cleared from the larynx), but this was improved with prompted coughing. SLP will continue to follow pt.     HPI HPI: [name] who presented with weakness and reported symptoms of dysphagia with emesis and c/o water not going past "halfway down". CXR negative for active disease. GI consulted who recommended esophagram, allowance of clear liquids (full liquid diet ordered), likely EGD on 1/23, and possible SLP swallow eval which was subsequently ordered by hospitalist. EGD: 3 cm hiatal hernia, no endoscopic findings to suggest dysphagia. MBS and esophagram recommended. Esophagram 1/24: Narrowed appearance of the distal esophagus. A swallowed 13 mm barium tablet did not pass beyond this point despite a prolonged period of observation. 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 PMH: HTN, HLD, stage 3a CKD, C4 cervical fracture s/p fusion (October, 2023), BPH, mild,  nonobstructing, distal esophageal stricture noted on ERCP 07/09/22.      SLP Plan  Continue with current plan of care      Recommendations for follow up therapy are one component of a multi-disciplinary discharge planning process, led by the attending physician.  Recommendations may be updated based on patient status, additional functional criteria and insurance authorization.    Recommendations  Diet recommendations: Dysphagia 3 (mechanical soft);Thin liquid Liquids provided via: Cup;Straw Medication Administration: Whole meds with liquid Supervision: Intermittent supervision to cue for compensatory strategies Compensations: Slow rate;Follow solids with liquid Postural Changes and/or Swallow Maneuvers: Seated upright 90 degrees                Oral Care Recommendations: Oral care BID Follow Up Recommendations: Skilled nursing-short term rehab (<3 hours/day) Assistance recommended at discharge: Intermittent Supervision/Assistance SLP Visit Diagnosis: Dysphagia, unspecified (R13.10) Plan: Continue with current plan of care          Nickey Canedo I. Hardin Negus, [place], [place] Office number [phone]  Horton Marshall  12/10/2022, 12:19 PM

## 2022-12-10 NOTE — [place] FL2 (Signed)
[place] MEDICAID FL2 LEVEL OF CARE FORM     IDENTIFICATION  Patient Name: Francisco [name] Birthdate: January 03, 1941 Sex: male Admission Date (Current Location): 12/07/2022  [facility] and [place] Number:  Herbalist and Address:  [facility] [place] [address], [place], [place] [address]      Provider Number: [id number]  Attending Physician Name and Address:  Elmarie Shiley, MD  Relative Name and Phone Number:  [name] (Daughter) [phone]    Current Level of Care: Hospital Recommended Level of Care: [place] Prior Approval Number:    Date Approved/Denied:   PASRR Number: [id number]  Discharge Plan: SNF    Current Diagnoses: Patient Active Problem List   Diagnosis Date Noted   Protein-calorie malnutrition, severe 12/09/2022   Total bilirubin, elevated 12/09/2022   Weight loss 12/08/2022   UTI (urinary tract infection) 12/07/2022   Elevated liver function tests 12/07/2022   Failure to thrive in adult 12/07/2022   Acute kidney injury superimposed on chronic kidney disease ([place]) 12/07/2022   Dysphagia 12/07/2022   Dyslipidemia 12/07/2022   C4 cervical fracture ([place]) 08/18/2022   Bile leak, postoperative    Constipation    Normocytic anemia 07/06/2022   RUQ abdominal pain 07/05/2022   Acute calculous cholecystitis 07/05/2022   Lactic acidosis 05/06/2022   Thrombocytopenia ([place]) 05/06/2022   Chronic kidney disease, stage 3a ([place]) 05/06/2022   Acute cholecystitis with chronic cholecystitis 03/17/2022   Peripheral vascular disease, unspecified ([place]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 12/16/2021   Chest pain of uncertain etiology 57/90/3833   BMI 31.0-31.9,adult 09/06/2015   BPH (benign prostatic hyperplasia) 08/06/2015   Asthma, chronic 04/04/2015   Essential hypertension 04/12/2013   GAD (generalized anxiety disorder) 04/12/2013   GERD without esophagitis 04/12/2013   Mixed hyperlipidemia 04/12/2013    Orientation  RESPIRATION BLADDER Height & Weight     Place, Situation, Time, Self  Normal Continent Weight: 164 lb 11.2 oz (74.7 kg) Height:  '5\' 9"'$  (175.3 cm)  BEHAVIORAL SYMPTOMS/MOOD NEUROLOGICAL BOWEL NUTRITION STATUS      Continent Diet (see d/c summary)  AMBULATORY STATUS COMMUNICATION OF NEEDS Skin   Limited Assist Verbally Normal                       Personal Care Assistance Level of Assistance  Bathing, Feeding, Dressing Bathing Assistance: Limited assistance Feeding assistance: Independent Dressing Assistance: Limited assistance     Functional Limitations Info  Sight, Speech, Hearing Sight Info: Impaired Hearing Info: Adequate Speech Info: Adequate    SPECIAL CARE FACTORS FREQUENCY  PT (By licensed PT), OT (By licensed OT)     PT Frequency: 5x/ week OT Frequency: 5x/ week            Contractures Contractures Info: Not present    Additional Factors Info  Code Status, Allergies Code Status Info: DNR Allergies Info: Crestor (Rosuvastatin)  Lipitor (Atorvastatin)  Penicillins  Symbicort (Budesonide-formoterol Fumarate)           Current Medications (12/10/2022):  This is the current hospital active medication list Current Facility-Administered Medications  Medication Dose Route Frequency Provider Last Rate Last Admin   0.9 %  sodium chloride infusion   Intravenous PRN Karmen Bongo, MD   Stopped at 12/08/22 3832   acetaminophen (TYLENOL) tablet 650 mg  650 mg Oral Q6H PRN Karmen Bongo, MD   650 mg at 12/08/22 2117   Or   acetaminophen (TYLENOL) suppository 650 mg  650 mg Rectal Q6H  PRN Karmen Bongo, MD       ALPRAZolam Duanne Moron) tablet 1 mg  1 mg Oral BID PRN Karmen Bongo, MD   1 mg at 12/09/22 2146   aspirin chewable tablet 81 mg  81 mg Oral Daily Karmen Bongo, MD   81 mg at 12/10/22 1041   ceFAZolin (ANCEF) IVPB 2g/100 mL premix  2 g Intravenous Q8H Regalado, Belkys A, MD 200 mL/hr at 12/10/22 0551 2 g at 12/10/22 0551   escitalopram (LEXAPRO) tablet  20 mg  20 mg Oral Daily Karmen Bongo, MD   20 mg at 12/10/22 1041   feeding supplement (ENSURE ENLIVE / ENSURE PLUS) liquid 237 mL  237 mL Oral TID BM Karmen Bongo, MD   237 mL at 12/09/22 2018   hydrALAZINE (APRESOLINE) injection 5 mg  5 mg Intravenous Q4H PRN Karmen Bongo, MD   5 mg at 12/10/22 0546   lactated ringers infusion   Intravenous Continuous Regalado, Belkys A, MD 75 mL/hr at 12/09/22 1643 Rate Change at 12/09/22 1643   metoprolol succinate (TOPROL-XL) 24 hr tablet 25 mg  25 mg Oral BID Karmen Bongo, MD   25 mg at 12/10/22 1041   metroNIDAZOLE (FLAGYL) IVPB 500 mg  500 mg Intravenous BID Karmen Bongo, MD 100 mL/hr at 12/10/22 1047 500 mg at 12/10/22 1047   morphine (PF) 2 MG/ML injection 2 mg  2 mg Intravenous Q2H PRN Karmen Bongo, MD       ondansetron [facility]) tablet 4 mg  4 mg Oral Q6H PRN Karmen Bongo, MD       Or   ondansetron [facility]) injection 4 mg  4 mg Intravenous Q6H PRN Karmen Bongo, MD       Oral care mouth rinse  15 mL Mouth Rinse 4 times per day Karmen Bongo, MD   15 mL at 12/09/22 2146   Oral care mouth rinse  15 mL Mouth Rinse PRN Karmen Bongo, MD   15 mL at 12/08/22 1539   pantoprazole (PROTONIX) EC tablet 40 mg  40 mg Oral Daily Donnamae Jude, RPH   40 mg at 12/10/22 1041   tamsulosin (FLOMAX) capsule 0.4 mg  0.4 mg Oral Daily Karmen Bongo, MD   0.4 mg at 12/10/22 1041     Discharge Medications: Please see discharge summary for a list of discharge medications.  Relevant Imaging Results:  Relevant Lab Results:   Additional Information SSN: [id number] Terrie Haring, LCSWA

## 2022-12-10 NOTE — TOC Initial Note (Signed)
Transition of Care [facility]) - Initial/Assessment Note    Patient Details  Name: Francisco [name] MRN: [id number] Date of Birth: 04-Jun-1941  [facility] CM/SW Contact:    Milinda Antis, [place] Phone Number: 12/10/2022, 2:05 PM  Clinical Narrative:                 CSW received consult for possible SNF placement at time of discharge. CSW spoke with patient. Patient expressed understanding of PT recommendation and is agreeable to SNF placement at time of discharge. Patient reports preference for [facility]. CSW discussed insurance authorization process and will provide Medicare SNF ratings list. CSW will send out referrals for review and provide bed offers as available.   Skilled Nursing Rehab Facilities-   [email or web address]   Ratings out of 5 stars (5 the highest)   Name Address  Phone # [place] Inspection Overall  [facility] [address] [place] '4 5 2 3  '$ [facility]  [address], [place] [phone] '4 2 5 5  '$ [facility] [place], [place] '1 3 1 1  '$ [place], [place] '2 2 4 4  '$ [facility] [address] [place] '2 1 2 1  '$ [place] '3 3 4 4  '$ [facility] [address], [place] '4 1 3 2  '$ [facility] [address], [place] '4 1 3 2  '$ [address] ([place]) [place], [place] [phone] '3 1 2 1  '$ [facility] [phone], [place] [phone] '3 1 1 1  '$ [facility] [address] [facility] [phone] '3 2 2 2  '$ [facility] ([place]) [address] [place], [place] [phone] '3 1 1 1  '$ [facility] [address] [phone] '4 2 4 4          '$ [place] [address], [place] '4 1 3 2  '$ [facility] [place] [address] [place] '3 1 5 4  '$ [address] [place], [place] [phone] '1 1 2 1  '$ [facility] [address], [employer] [phone] '2 2 4 4          '$ [address] (no [facility]) [place] [address], [place] [phone] '5 5 5 5  '$ [facility] (No Humana) [address] [place], [place] '4 1 4 3  '$ [facility] (No UHC) [place], [place] '5 5 5 5  '$ [facility] [address], [employer] [phone] '2 3 5 5  '$ [place] [address], [place] '1 1 2 1  '$ [place] [address], [place] [phone] '3 1 1 1  '$ [place], [place] '5 2 5 5  '$ [facility]  [address], [place] '2 2 1 1  '$ [place] [address] [place] '3 2 1 1  '$ [facility] [place], [place] '2 2 2 2          '$ [facility] [address], [place] [phone] '1 1 1 1  '$ [place] [address] [place]  [phone] '2 4 3 3  '$ [facility] [place] [address], [place] [phone] '3 2 3 3  '$ [facility] [place] [address], [place] 2  $'1 1 1  'I$ [place] (No Humana) [address] [place] [phone] '2 2 3 3  '$ [name]) [address], [place] [phone] '2 1 1 1          '$ [facility] [place], [place] '5 4 5 5  '$ [facility])  [address], [place] '2 1 2 1  '$ [facility]) [place] [address] [place] '3 1 4 3  '$ [place] [address] [place] '3 3 4 4  '$ [address] [place], [place] '2 3 1 1  '$ [facility]) [address] [place] [phone] '2 1 4 3    '$ Expected Discharge Plan: [place]      Patient Goals and CMS Choice Patient states their goals for this hospitalization and ongoing recovery are:: to go to rehab   Choice offered to / list presented to : Patient      Expected Discharge Plan and Services In-house Referral: Clinical Social Work     Living arrangements for the past 2 months: Single Family Home                                      Prior Living Arrangements/Services Living arrangements for the past 2 months: Single Family Home Lives with:: Self Patient language and need for interpreter reviewed:: Yes Do you feel safe going back to the place where you live?: Yes      Need for Family Participation in Patient Care: Yes (Comment) Care giver support system in place?: Yes (comment)   Criminal Activity/Legal Involvement Pertinent to Current Situation/Hospitalization: No - Comment as needed  Activities of Daily Living Home Assistive Devices/Equipment: Cane (specify quad or straight), Walker (specify type) ADL Screening (condition at time of admission) Patient's cognitive ability adequate to safely complete daily activities?: Yes Is the patient deaf or have difficulty hearing?: No Does the patient have difficulty seeing, even when wearing glasses/contacts?: No Does the patient have difficulty concentrating, remembering, or making decisions?: No Patient able to express need for assistance with ADLs?: Yes Does the patient have difficulty dressing or bathing?: No Independently performs ADLs?: Yes (appropriate for developmental age) Communication: Independent Dressing (OT): Needs assistance Is this a change from baseline?: Change from baseline, expected to last >3 days Grooming: Independent Feeding: Independent Bathing: Needs assistance Is this a change from baseline?: Change from baseline, expected to last >3 days Toileting: Needs assistance Is this a change from baseline?: Change from baseline, expected to last >3days In/Out Bed: Needs  assistance Is this a change from baseline?: Change from baseline, expected to last >3 days Walks in Home: Independent Does the patient have difficulty walking or climbing stairs?: Yes Weakness of Legs: Both Weakness of Arms/Hands: Both  Permission Sought/Granted   Permission granted to share information with : Yes, Verbal Permission Granted     Permission granted to share info w AGENCY: SNF        Emotional Assessment Appearance:: Appears stated age Attitude/Demeanor/Rapport: Engaged Affect (typically observed): Appropriate Orientation: : Oriented to Situation, Oriented to  Time, Oriented to Place, Oriented to Self Alcohol / Substance Use: Not Applicable Psych Involvement: No (comment)  Admission diagnosis:  UTI (urinary tract infection) [N39.0] Elevated liver function tests [R79.89] Elevated LFTs [R79.89] Acute kidney injury (nontraumatic) (HCC) [N17.9] Total bilirubin, elevated [R17] Normochromic normocytic  anemia [D64.9] Elevated random blood glucose level [R73.09] Urinary tract infection without hematuria, site unspecified [N39.0] Patient Active Problem List   Diagnosis Date Noted   Protein-calorie malnutrition, severe 12/09/2022   Total bilirubin, elevated 12/09/2022   Weight loss 12/08/2022   UTI (urinary tract infection) 12/07/2022   Elevated liver function tests 12/07/2022   Failure to thrive in adult 12/07/2022   Acute kidney injury superimposed on chronic kidney disease ([place]) 12/07/2022   Dysphagia 12/07/2022   Dyslipidemia 12/07/2022   C4 cervical fracture ([place]) 08/18/2022   Bile leak, postoperative    Constipation    Normocytic anemia 07/06/2022   RUQ abdominal pain 07/05/2022   Acute calculous cholecystitis 07/05/2022   Lactic acidosis 05/06/2022   Thrombocytopenia ([place]) 05/06/2022   Chronic kidney disease, stage 3a ([place]) 05/06/2022   Acute cholecystitis with chronic cholecystitis 03/17/2022   Peripheral vascular disease, unspecified ([place]) carotic  arterial disease 12/16/2021   Chest pain of uncertain etiology 25/95/6387   BMI 31.0-31.9,adult 09/06/2015   BPH (benign prostatic hyperplasia) 08/06/2015   Asthma, chronic 04/04/2015   Essential hypertension 04/12/2013   GAD (generalized anxiety disorder) 04/12/2013   GERD without esophagitis 04/12/2013   Mixed hyperlipidemia 04/12/2013   PCP:  [name], FNP Pharmacy:   [facility] ([place], [place] - [address] [facility] [address] [place] [address] [place] [address] Phone: [phone] Fax: [phone]  [place], [place] [address] Phone: [phone] Fax: [place] [address], [place] [address] [employer] [place] [address] Phone: [phone] Fax: [phone]     Social Determinants of Health (SDOH) Social History: SDOH Screenings   Food Insecurity: No Food Insecurity (12/09/2022)  Housing: Low Risk  (12/09/2022)  Transportation Needs: No Transportation Needs (12/09/2022)  Utilities: Not At Risk (12/09/2022)  Alcohol Screen: Low Risk  (08/08/2021)  Depression (PHQ2-9): Low Risk  (08/14/2022)  Financial Resource Strain: Low Risk  (08/20/2022)  Physical Activity: Inactive (08/08/2021)  Social Connections: Socially Isolated (08/08/2021)  Stress: No Stress Concern Present (08/08/2021)  Tobacco Use: Medium Risk (12/08/2022)   SDOH Interventions:     Readmission Risk Interventions     No data to display

## 2022-12-10 NOTE — Progress Notes (Signed)
PROGRESS NOTE    ANTERIO [name]  Francisco [name] DOB: 03-20-1941 DOA: 12/07/2022 PCP: [name], FNP   Brief Narrative: 82 year old with past medical history significant for hypertension, hyperlipidemia, CKD stage IIIa, BPH who presented to [name] ED on 1/22 with complaints of generalized weakness, dysphagia with associated nausea vomiting.  Also reported rigors and urinary frequency and dysuria the day prior to admission.  He was admitted August 2023 with acute on chronic cholecystitis requiring laparoscopic cholecystectomy with drain placement complicated by bile leak postoperative required placement of a CBD stent via ERCP.  He presented with transaminases, bilirubin 3.9, abdominal ultrasound cholecystectomy without biliary dilation.  Patient was found to have Klebsiella pneumoniae bacteremia, sepsis and urinary tract infection.  Worsening transaminases and underwent ERCP on 1/23, biliary stent was removed, prior sphincterectomy, prior subtotal cholecystectomy, moderate amount of CBD sludge, biliary tree was swept and sludge was found and removed.   Assessment & Plan:   Principal Problem:   Failure to thrive in adult Active Problems:   Essential hypertension   BPH (benign prostatic hyperplasia)   GAD (generalized anxiety disorder)   UTI (urinary tract infection)   Elevated liver function tests   Acute kidney injury superimposed on chronic kidney disease (HCC)   Dysphagia   Dyslipidemia   Weight loss   Protein-calorie malnutrition, severe   Total bilirubin, elevated  1-Klebsiella pneumonia  bacteremia, sepsis POA Urinary tract infection: -Patient presented with weakness, urinary symptoms, UA positive nitrate, blood cultures positive for Klebsiella pneumoniae Antibiotics changed to Ancef per sensitivity He will need a total of 10 to 14 days treatment, he can be discharged on oral antibiotics  2-Transaminases, hyperbilirubinemia:  History of Subtotal  Cholecystectomy, status post ERCP with biliary stent placement for postoperative bile leak on 06/2022. -Presented with transaminases hyperbilirubinemia -Continue to hold statins -Underwent ERCP 1/23: Biliary stent was removed, prior sphincterectomy, prior subtotal cholecystectomy, moderate amount of CBD sludge, biliary tree was swept and sludge was found and removed. -Liver function test trending down -Appreciate  GI evaluation  Acute renal failure on CKD stage IIIa: Likely secondary to prerenal azotemia in the setting of dehydration versus ATN Creatinine peaked to 1.8--- 10 down to 1.2 Received IV Fluids  Dysphagia/failure to thrive And reports difficulty eating solids and sometimes liquids. Underwent EGD 1/23 with no significant finding but dilation perform Barium esophagogram 1/24: Limited study,  Narrowed appearance of the distal esophagus. A swallowed 13 mm barium tablet did not pass beyond this point despite a prolonged period of observation. 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 Soft diet trial.  On PPI.  Oral pharyngeal dysphagia.  Dysphagia 3 diet. Monitor on current diet.   Severe protein malnutrition: Continue with Ensure   Nutrition Problem: Severe Malnutrition Etiology: chronic illness    Signs/Symptoms: energy intake < 75% for > or equal to 1 month, percent weight loss Percent weight loss: 19 % (over 5 months)    Interventions: Ensure Enlive (each supplement provides 350kcal and 20 grams of protein)  Estimated body mass index is 24.32 kg/m as calculated from the following:   Height as of this encounter: '5\' 9"'$  (1.753 m).   Weight as of this encounter: 74.7 kg.   DVT prophylaxis: SCDs Code Status: DNR Family Communication: Care discussed with patient Disposition Plan:  Status is: Inpatient Remains inpatient appropriate because: Discharge tomorrow if he is tolerating diet.     Consultants:   GI  Procedures:  ERCP  Antimicrobials:  Subjective: He couldn't eat last night.  He just came from swallow evaluation by speech.  Plan to try dysphagia 3 diet.   Objective: Vitals:   12/09/22 2144 12/10/22 0544 12/10/22 0821 12/10/22 1553  BP: (!) 188/83 (!) 185/80 (!) 116/54 (!) 177/80  Pulse: 70 71 72 65  Resp: '18 18 17 18  '$ Temp: 97.8 F (36.6 C) 98.1 F (36.7 C) 97.8 F (36.6 C) 97.8 F (36.6 C)  TempSrc: Oral Oral Oral Oral  SpO2: 98%  99% 97%  Weight:      Height:        Intake/Output Summary (Last 24 hours) at 12/10/2022 1715 Last data filed at 12/10/2022 1300 Gross per 24 hour  Intake 340 ml  Output --  Net 340 ml    Filed Weights   12/07/22 0058 12/07/22 1302  Weight: 77.1 kg 74.7 kg    Examination:  General exam: NAD Respiratory system: CTA Cardiovascular system: S 1, S 2 RRR Gastrointestinal system: BS present, soft, nt Central nervous system: Alert, follows command Extremities: no edema   Data Reviewed: I have personally reviewed following labs and imaging studies  CBC: Recent Labs  Lab 12/07/22 0111 12/08/22 0653 12/09/22 0407  WBC 15.8* 7.4 7.8  NEUTROABS 14.8*  --   --   HGB 12.2* 10.5* 10.9*  HCT 37.5* 32.3* 32.0*  MCV 97.7 97.6 95.8  PLT 246 206 557    Basic Metabolic Panel: Recent Labs  Lab 12/07/22 0111 12/08/22 0653 12/09/22 0407  NA 136 135 135  K 4.0 4.0 4.2  CL 100 101 99  CO2 '24 25 28  '$ GLUCOSE 218* 127* 105*  BUN '20 12 11  '$ CREATININE 1.88* 1.22 1.22  CALCIUM 8.9 8.4* 8.4*    GFR: Estimated Creatinine Clearance: 47.5 mL/min (by C-G formula based on SCr of 1.22 mg/dL). Liver Function Tests: Recent Labs  Lab 12/07/22 0111 12/08/22 0653 12/09/22 0407  AST 90* 38 29  ALT 130* 74* 59*  ALKPHOS 411* 264* 227*  BILITOT 3.9* 1.3* 1.1  PROT 6.6 5.2* 5.0*  ALBUMIN 3.2* 2.5* 2.5*    No results for input(s): "LIPASE", "AMYLASE" in the last 168 hours. No results for input(s): "AMMONIA" in the last 168  hours. Coagulation Profile: No results for input(s): "INR", "PROTIME" in the last 168 hours. Cardiac Enzymes: No results for input(s): "CKTOTAL", "CKMB", "CKMBINDEX", "TROPONINI" in the last 168 hours. BNP (last 3 results) No results for input(s): "PROBNP" in the last 8760 hours. HbA1C: No results for input(s): "HGBA1C" in the last 72 hours. CBG: No results for input(s): "GLUCAP" in the last 168 hours. Lipid Profile: No results for input(s): "CHOL", "HDL", "LDLCALC", "TRIG", "CHOLHDL", "LDLDIRECT" in the last 72 hours. Thyroid Function Tests: No results for input(s): "TSH", "T4TOTAL", "FREET4", "T3FREE", "THYROIDAB" in the last 72 hours. Anemia Panel: No results for input(s): "VITAMINB12", "FOLATE", "FERRITIN", "TIBC", "IRON", "RETICCTPCT" in the last 72 hours. Sepsis Labs: Recent Labs  Lab 12/07/22 0111  LATICACIDVEN 1.2     Recent Results (from the past 240 hour(s))  Blood culture (routine x 2)     Status: Abnormal   Collection Time: 12/07/22  1:11 AM   Specimen: BLOOD RIGHT ARM  Result Value Ref Range Status   Specimen Description BLOOD RIGHT ARM  Final   Special Requests   Final    BOTTLES DRAWN AEROBIC AND ANAEROBIC Blood Culture results may not be optimal due to an excessive volume of blood received in culture bottles   Culture  Setup Time  Final    ANAEROBIC BOTTLE ONLY GRAM NEGATIVE RODS CRITICAL RESULT CALLED TO, READ BACK BY AND VERIFIED WITH:  C/ PHARMD E. MARTIN 12/07/22 1518 A. LAFRANCE    Culture KLEBSIELLA PNEUMONIAE (A)  Final   Report Status 12/09/2022 FINAL  Final   Organism ID, Bacteria KLEBSIELLA PNEUMONIAE  Final      Susceptibility   Klebsiella pneumoniae - MIC*    AMPICILLIN RESISTANT Resistant     CEFAZOLIN <=4 SENSITIVE Sensitive     CEFEPIME <=0.12 SENSITIVE Sensitive     CEFTAZIDIME <=1 SENSITIVE Sensitive     CEFTRIAXONE <=0.25 SENSITIVE Sensitive     CIPROFLOXACIN <=0.25 SENSITIVE Sensitive     GENTAMICIN <=1 SENSITIVE Sensitive      IMIPENEM <=0.25 SENSITIVE Sensitive     TRIMETH/SULFA <=20 SENSITIVE Sensitive     AMPICILLIN/SULBACTAM 4 SENSITIVE Sensitive     PIP/TAZO <=4 SENSITIVE Sensitive     * KLEBSIELLA PNEUMONIAE  Urine Culture     Status: Abnormal   Collection Time: 12/07/22  1:11 AM   Specimen: Urine, Clean Catch  Result Value Ref Range Status   Specimen Description URINE, CLEAN CATCH  Final   Special Requests NONE  Final   Culture (A)  Final    <10,000 COLONIES/mL INSIGNIFICANT GROWTH Performed at [facility], [address], [place], [place] [address]    Report Status 12/08/2022 FINAL  Final  Resp panel by RT-PCR (RSV, Flu A&B, Covid) Anterior Nasal Swab     Status: None   Collection Time: 12/07/22  1:11 AM   Specimen: Anterior Nasal Swab  Result Value Ref Range Status   SARS Coronavirus 2 by RT PCR NEGATIVE NEGATIVE Final    Comment: (NOTE) SARS-CoV-2 target nucleic acids are NOT DETECTED.  The SARS-CoV-2 RNA is generally detectable in upper respiratory specimens during the acute phase of infection. The lowest concentration of SARS-CoV-2 viral copies this assay can detect is 138 copies/mL. A negative result does not preclude SARS-Cov-2 infection and should not be used as the sole basis for treatment or other patient management decisions. A negative result may occur with  improper specimen collection/handling, submission of specimen other than nasopharyngeal swab, presence of viral mutation(s) within the areas targeted by this assay, and inadequate number of viral copies(<138 copies/mL). A negative result must be combined with clinical observations, patient history, and epidemiological information. The expected result is Negative.  Fact Sheet for Patients:  EntrepreneurPulse.com.au  Fact Sheet for Healthcare Providers:  IncredibleEmployment.be  This test is no t yet approved or cleared by the [place] FDA and  has been authorized for detection  and/or diagnosis of SARS-CoV-2 by FDA under an Emergency Use Authorization (EUA). This EUA will remain  in effect (meaning this test can be used) for the duration of the COVID-19 declaration under Section 564(b)(1) of the Act, 21 U.S.C.section 360bbb-3(b)(1), unless the authorization is terminated  or revoked sooner.       Influenza A by PCR NEGATIVE NEGATIVE Final   Influenza B by PCR NEGATIVE NEGATIVE Final    Comment: (NOTE) The Xpert Xpress SARS-CoV-2/FLU/RSV plus assay is intended as an aid in the diagnosis of influenza from Nasopharyngeal swab specimens and should not be used as a sole basis for treatment. Nasal washings and aspirates are unacceptable for Xpert Xpress SARS-CoV-2/FLU/RSV testing.  Fact Sheet for Patients: EntrepreneurPulse.com.au  Fact Sheet for Healthcare Providers: IncredibleEmployment.be  This test is not yet approved or cleared by the [place] and has been authorized for  detection and/or diagnosis of SARS-CoV-2 by FDA under an Emergency Use Authorization (EUA). This EUA will remain in effect (meaning this test can be used) for the duration of the COVID-19 declaration under Section 564(b)(1) of the Act, 21 U.S.C. section 360bbb-3(b)(1), unless the authorization is terminated or revoked.     Resp Syncytial Virus by PCR NEGATIVE NEGATIVE Final    Comment: (NOTE) Fact Sheet for Patients: EntrepreneurPulse.com.au  Fact Sheet for Healthcare Providers: IncredibleEmployment.be  This test is not yet approved or cleared by the [place] FDA and has been authorized for detection and/or diagnosis of SARS-CoV-2 by FDA under an Emergency Use Authorization (EUA). This EUA will remain in effect (meaning this test can be used) for the duration of the COVID-19 declaration under Section 564(b)(1) of the Act, 21 U.S.C. section 360bbb-3(b)(1), unless the authorization is terminated  or revoked.  Performed at [facility], [place] [address] [place], [place] [address]   Blood Culture ID Panel (Reflexed)     Status: Abnormal   Collection Time: 12/07/22  1:11 AM  Result Value Ref Range Status   Enterococcus faecalis NOT DETECTED NOT DETECTED Final   Enterococcus Faecium NOT DETECTED NOT DETECTED Final   Listeria monocytogenes NOT DETECTED NOT DETECTED Final   Staphylococcus species NOT DETECTED NOT DETECTED Final   Staphylococcus aureus (BCID) NOT DETECTED NOT DETECTED Final   Staphylococcus epidermidis NOT DETECTED NOT DETECTED Final   Staphylococcus lugdunensis NOT DETECTED NOT DETECTED Final   Streptococcus species NOT DETECTED NOT DETECTED Final   Streptococcus agalactiae NOT DETECTED NOT DETECTED Final   Streptococcus pneumoniae NOT DETECTED NOT DETECTED Final   Streptococcus pyogenes NOT DETECTED NOT DETECTED Final   A.calcoaceticus-baumannii NOT DETECTED NOT DETECTED Final   Bacteroides fragilis NOT DETECTED NOT DETECTED Final   Enterobacterales DETECTED (A) NOT DETECTED Final    Comment: Enterobacterales represent a large order of gram negative bacteria, not a single organism. CRITICAL RESULT CALLED TO, READ BACK BY AND VERIFIED WITH:  C/ PHARMD E. MARTIN 12/07/22 1518 A. LAFRANCE    Enterobacter cloacae complex NOT DETECTED NOT DETECTED Final   Escherichia coli NOT DETECTED NOT DETECTED Final   Klebsiella aerogenes NOT DETECTED NOT DETECTED Final   Klebsiella oxytoca NOT DETECTED NOT DETECTED Final   Klebsiella pneumoniae DETECTED (A) NOT DETECTED Final    Comment: CRITICAL RESULT CALLED TO, READ BACK BY AND VERIFIED WITH:  C/ PHARMD E. MARTIN 12/07/22 1518 A. LAFRANCE    Proteus species NOT DETECTED NOT DETECTED Final   Salmonella species NOT DETECTED NOT DETECTED Final   Serratia marcescens NOT DETECTED NOT DETECTED Final   Haemophilus influenzae NOT DETECTED NOT DETECTED Final   Neisseria meningitidis NOT DETECTED NOT DETECTED Final    Pseudomonas aeruginosa NOT DETECTED NOT DETECTED Final   Stenotrophomonas maltophilia NOT DETECTED NOT DETECTED Final   Candida albicans NOT DETECTED NOT DETECTED Final   Candida auris NOT DETECTED NOT DETECTED Final   Candida glabrata NOT DETECTED NOT DETECTED Final   Candida krusei NOT DETECTED NOT DETECTED Final   Candida parapsilosis NOT DETECTED NOT DETECTED Final   Candida tropicalis NOT DETECTED NOT DETECTED Final   Cryptococcus neoformans/gattii NOT DETECTED NOT DETECTED Final   CTX-M ESBL NOT DETECTED NOT DETECTED Final   Carbapenem resistance IMP NOT DETECTED NOT DETECTED Final   Carbapenem resistance KPC NOT DETECTED NOT DETECTED Final   Carbapenem resistance NDM NOT DETECTED NOT DETECTED Final   Carbapenem resist OXA 48 LIKE NOT DETECTED NOT DETECTED Final  Carbapenem resistance VIM NOT DETECTED NOT DETECTED Final    Comment: Performed at [facility], [place] [address] [place], [place] [address]  Blood culture (routine x 2)     Status: Abnormal   Collection Time: 12/07/22  3:07 AM   Specimen: BLOOD LEFT HAND  Result Value Ref Range Status   Specimen Description BLOOD LEFT HAND  Final   Special Requests   Final    BOTTLES DRAWN AEROBIC AND ANAEROBIC Blood Culture adequate volume   Culture  Setup Time   Final    GRAM NEGATIVE RODS IN BOTH AEROBIC AND ANAEROBIC BOTTLES CRITICAL RESULT CALLED TO, READ BACK BY AND VERIFIED WITH: PHARMD KAILEY MIKOLICHEK ON 4/43/15 @ 4008 BY DRT    Culture (A)  Final    KLEBSIELLA PNEUMONIAE SUSCEPTIBILITIES PERFORMED ON PREVIOUS CULTURE WITHIN THE LAST 5 DAYS. Performed at [facility], [place] [address] [place], [place] [address]    Report Status 12/09/2022 FINAL  Final         Radiology Studies: DG Swallowing Func-Speech Pathology  Result Date: 12/10/2022 Table formatting from the original result was not included. 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  Procedure: REVISION AMPUTATION LEFT INDEX FINGER REPAIR LACERATION LEFT THUMB;  Surgeon: [name], MD;  Location: [place];  Service: Orthopedics;  Laterality: Left;  BILIARY STENT PLACEMENT N/A 07/09/2022  Procedure: BILIARY STENT PLACEMENT;  Surgeon: Ladene Artist, MD;  Location: [facility];  Service: Gastroenterology;  Laterality: N/A;  CHOLECYSTECTOMY N/A 07/05/2022  Procedure: LAPAROSCOPIC CHOLECYSTECTOMY;  Surgeon: Dwan Bolt, MD;  Location: [facility];  Service: General;  Laterality: N/A;  ENDOSCOPIC RETROGRADE CHOLANGIOPANCREATOGRAPHY (ERCP) WITH PROPOFOL N/A 12/08/2022  Procedure: ENDOSCOPIC RETROGRADE CHOLANGIOPANCREATOGRAPHY (ERCP) WITH PROPOFOL;  Surgeon: Ladene Artist, MD;  Location: [facility];  Service: Gastroenterology;  Laterality: N/A;  ERCP N/A 07/09/2022  Procedure: ENDOSCOPIC RETROGRADE CHOLANGIOPANCREATOGRAPHY (ERCP);  Surgeon: Ladene Artist, MD;  Location: [facility];  Service: Gastroenterology;  Laterality: N/A;  ESOPHAGOGASTRODUODENOSCOPY (EGD) WITH PROPOFOL N/A 12/08/2022  Procedure: ESOPHAGOGASTRODUODENOSCOPY (EGD) WITH PROPOFOL;  Surgeon: Mauri Pole, MD;  Location: [facility];  Service: Gastroenterology;  Laterality: N/A;  POSTERIOR CERVICAL FUSION/FORAMINOTOMY N/A 08/18/2022  Procedure: C4-5 Posterior cervical fusion with lateral mass fixation;  Surgeon: Ashok Pall, MD;  Location: [facility];  Service: Neurosurgery;  Laterality: N/A;  REMOVAL OF STONES  12/08/2022  Procedure: REMOVAL OF STONES;  Surgeon: Ladene Artist, MD;  Location: [facility];  Service: Gastroenterology;;  [name]  07/09/2022  Procedure: [id number];  Surgeon: Ladene Artist, MD;  Location: [facility];  Service: Gastroenterology;;  [place]  12/08/2022  Procedure: STENT REMOVAL;  Surgeon: Ladene Artist, MD;  Location[facility];  Service: Gastroenterology;; HPI: [name] who presented with weakness and reported symptoms of dysphagia with emesis and c/o water not going past "halfway down". CXR negative for active disease. GI consulted who recommended esophagram, allowance of clear liquids (full liquid diet ordered), likely EGD on 1/23, and possible SLP swallow  eval which was subsequently ordered by hospitalist. EGD: 3 cm hiatal hernia, no endoscopic findings to suggest dysphagia. MBS and esophagram recommended. Esophagram 1/24: Narrowed appearance of the distal esophagus. A swallowed 13 mm barium tablet did not pass beyond this point despite a prolonged period of observation. 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 PMH: HTN, HLD, stage 3a CKD, C4 cervical fracture s/p fusion (October, 2023), BPH, mild, nonobstructing, distal esophageal stricture noted on ERCP 07/09/22.  No data recorded  Recommendations for follow up therapy are one component of a multi-disciplinary discharge planning process, led by the attending physician.  Recommendations may be updated based on patient status, additional functional criteria and insurance authorization. Assessment / Plan / Recommendation   12/10/2022  10:31 AM Clinical Impressions Clinical Impression Pt presents with pharyngeal dysphagia characterized by a pharyngeal delay, and reduction in tongue base retraction, pharyngeal stripping, and anterior laryngeal movement. He demonstrated incomplete epiglottic inversion, base of tongue residue, vallecular residue, and posterior pharyngeal wall residue, Secondary swallows were  ineffective in siginificantly reducing vallecular residue, but a liquid wash reduced it to a more functional level. He demonstrated penetration (PAS 3, 5) and aspiration (PAS 7,8) with consecutive swallows of thin liquids and with the initial bolus via spoon. Transient penetration (PAS 2; WNL) was observed with consecutive swallows of nectar thick liquids. Trace aspiration inconsistently triggered throat clearing and moderate aspiration resulted in coughing which was moderately effective in expelling aspirated material. Laryngeal invasion was eliminated with use of individual boluses of thin liquids. Visualization of the esophagus revealed stasis of barium in the lower thoracic esophagus and transport of the 90m barium tablet was delayed in this area; esophageal work-up already completed. A dysphagia 3 diet with thin liquids is recommended with observance of swallowing precautions. SLP Visit Diagnosis Dysphagia, pharyngeal phase (R13.13) Impact on safety and function Mild aspiration risk     12/10/2022  10:31 AM Treatment Recommendations Treatment Recommendations Therapy as outlined in treatment plan below     12/10/2022  10:31 AM Prognosis Prognosis for Safe Diet Advancement Good   12/10/2022  10:31 AM Diet Recommendations SLP Diet Recommendations Dysphagia 3 (Mech soft) solids;Thin liquid Liquid Administration via Cup;Straw Medication Administration Whole meds with liquid Compensations Slow rate;Follow solids with liquid;Small sips/bites Postural Changes Seated upright at 90 degrees     12/10/2022  10:31 AM Other Recommendations Oral Care Recommendations Oral care BID Follow Up Recommendations Skilled nursing-short term rehab (<3 hours/day) Functional Status Assessment Patient has had a recent decline in their functional status and demonstrates the ability to make significant improvements in function in a reasonable and predictable amount of time.   12/10/2022  10:31 AM Frequency and Duration  Speech Therapy Frequency  (ACUTE ONLY) min 2x/week Treatment Duration 2 weeks     12/10/2022  10:31 AM [facility]   12/10/2022  10:31 AM Pharyngeal Phase Pharyngeal Phase Impaired Pharyngeal- Nectar Cup Reduced anterior laryngeal mobility;Reduced epiglottic inversion;Reduced pharyngeal peristalsis;Delayed swallow initiation-vallecula;Delayed swallow initiation-pyriform sinuses;Penetration/Aspiration during swallow;Pharyngeal residue - valleculae;Pharyngeal residue - posterior pharnyx;Pharyngeal residue - pyriform Pharyngeal Material enters airway, remains ABOVE vocal cords then ejected out Pharyngeal- Thin Teaspoon Reduced anterior laryngeal mobility;Reduced epiglottic inversion;Reduced pharyngeal peristalsis;Delayed swallow initiation-vallecula;Delayed swallow initiation-pyriform sinuses;Pharyngeal residue - valleculae;Pharyngeal residue - posterior pharnyx;Pharyngeal residue - pyriform;Penetration/Aspiration before swallow Pharyngeal Material enters airway, passes BELOW cords without attempt by patient to eject out (silent aspiration);Material enters airway, passes BELOW cords and not ejected out despite cough attempt by patient Pharyngeal-  Thin Cup Reduced anterior laryngeal mobility;Reduced epiglottic inversion;Reduced pharyngeal peristalsis;Delayed swallow initiation-vallecula;Delayed swallow initiation-pyriform sinuses;Penetration/Aspiration during swallow;Pharyngeal residue - valleculae;Pharyngeal residue - posterior pharnyx;Pharyngeal residue - pyriform;Penetration/Aspiration before swallow Pharyngeal Material enters airway, passes BELOW cords and not ejected out despite cough attempt by patient;Material enters airway, remains ABOVE vocal cords and not ejected out;Material enters airway, CONTACTS cords and not ejected out Pharyngeal- Thin Straw Reduced anterior laryngeal mobility;Reduced epiglottic inversion;Reduced pharyngeal peristalsis;Delayed swallow initiation-vallecula;Delayed swallow initiation-pyriform  sinuses;Penetration/Aspiration during swallow;Pharyngeal residue - valleculae;Pharyngeal residue - posterior pharnyx;Pharyngeal residue - pyriform Pharyngeal Material enters airway, remains ABOVE vocal cords and not ejected out Pharyngeal- Puree Reduced anterior laryngeal mobility;Reduced epiglottic inversion;Reduced pharyngeal peristalsis;Delayed swallow initiation-vallecula;Delayed swallow initiation-pyriform sinuses;Pharyngeal residue - valleculae;Pharyngeal residue - posterior pharnyx;Pharyngeal residue - pyriform Pharyngeal- Regular Reduced anterior laryngeal mobility;Reduced epiglottic inversion;Reduced pharyngeal peristalsis;Delayed swallow initiation-vallecula;Delayed swallow initiation-pyriform sinuses;Pharyngeal residue - valleculae;Pharyngeal residue - posterior pharnyx;Pharyngeal residue - pyriform Pharyngeal- Pill Reduced anterior laryngeal mobility;Reduced epiglottic inversion;Reduced pharyngeal peristalsis;Delayed swallow initiation-vallecula;Delayed swallow initiation-pyriform sinuses;Pharyngeal residue - valleculae;Pharyngeal residue - posterior pharnyx;Pharyngeal residue - pyriform    12/10/2022  10:31 AM Cervical Esophageal Phase  Cervical Esophageal Phase [facility] Shanika I. Hardin Negus, [place], [place] Office number [phone] Horton Marshall 12/10/2022, 11:05 AM                     DG ESOPHAGUS W SINGLE CM (SOL OR THIN BA)  Result Date: 12/09/2022 CLINICAL DATA:  Provided history: Dysphagia. Additional history provided: Recent endoscopy and esophageal dilation. EXAM: ESOPHAGUS/BARIUM SWALLOW/TABLET STUDY TECHNIQUE: A single contrast examination was performed using Omnipaque 300. Additionally, the patient swallowed a 13 mm barium tablet under fluoroscopy. The procedure was performed by Reatha Armour, PA-C, and was supervised and interpreted by Dr. Kellie Simmering. FLUOROSCOPY: Fluoroscopy time: 1 minute, 36 seconds (8.5 mGy). COMPARISON:  Chest CT 05/05/2022. FINDINGS:  Examination somewhat limited by the patient's limited ability to reposition on the fluoroscopy table. Narrowed appearance of the distal esophagus. A swallowed 13 mm barium tablet did not pass beyond this point despite a prolonged period of observation. Prominent esophageal dysmotility with tertiary contractions. Small hiatal hernia. Small-volume gastroesophageal reflux observed to the level of the lower esophagus. IMPRESSION: 1. Examination somewhat limited by the patient's limited ability to reposition on the fluoroscopy table. 2. Narrowed appearance of the distal esophagus. A swallowed 13 mm barium tablet did not pass beyond this point despite a prolonged period of observation. 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 Correlate with findings on reported recent endoscopy. 3. Prominent esophageal dysmotility. 4. Small hiatal hernia. 5. Small-volume gastroesophageal reflux observed to the level of the lower esophagus Electronically Signed   By: Kellie Simmering D.O.   On: 12/09/2022 11:31        Scheduled Meds:  aspirin  81 mg Oral Daily   escitalopram  20 mg Oral Daily   feeding supplement  237 mL Oral TID BM   metoprolol succinate  25 mg Oral BID   mouth rinse  15 mL Mouth Rinse 4 times per day   pantoprazole  40 mg Oral Daily   tamsulosin  0.4 mg Oral Daily   Continuous Infusions:  sodium chloride Stopped (12/08/22 0624)    ceFAZolin (ANCEF) IV 2 g (12/10/22 1455)   lactated ringers 75 mL/hr at 12/09/22 1643   metronidazole 500 mg (12/10/22 1047)     LOS: 3 days    Time spent: 35 minutes    Yanni Ruberg A Yarieliz Wasser, MD [facility]   If  7PM-7AM, please contact night-coverage [email or web address]  12/10/2022, 5:15 PM

## 2022-12-10 NOTE — Progress Notes (Addendum)
TRH night cross cover note:   I was contacted by patient's RN requesting clarification on existing order for continuous lactated Ringer's at 75 cc/h, which has been ordered since time of admission on 12/07/2022, with initial indication for these IV fluids noted to be acute kidney injury.  RN conveys that the IV fluids have been off since yesterday afternoon (1/24), but that the order for lactated Ringer's has persisted.  There has been interval resolution of initial AKI, with most recent creatinine 1.22, consistent with the patient's baseline renal function.  Per my chart review, vital signs appear stable, including normotensive blood pressures   Subsequently, I discontinued the existing order for continuous lactated Ringer's at 75 cc/h, and communicated this plan to the patient's RN.    Update: I was contacted by patient's RN regarding elevated blood pressure that prompted dose from existing order for prn 5 mg of IV hydralazine, following which patient's blood pressures is improving, but remains elevated at this time.  Corresponding heart rates in the 60s.  As the patient's blood pressure appears to be improving with the IV hydralazine, I subsequently placed order for a one-time additional dose of hydralazine 5 mg IV.     Babs Bertin, DO Hospitalist

## 2022-12-10 NOTE — Progress Notes (Signed)
Occupational Therapy Treatment Patient Details Name: Francisco [name] MRN: [id number] DOB: Feb 12, 1941 Today's Date: 12/10/2022   History of present illness Patient is 82 y.o. male presenting with weakness and c/o difficulty swallowing as well as emesis and rigors... PMH significant of HTN, HLD, stage 3a CKD, and BPH. Recently he was last admitted from 10/3-4 with C4 cervical fracture s/p fusion. Pt reports decline since fusion and difficulty eating/swallowing. Prior to that, he was admitted in June for E coli bacteremia and from 8/20-29 with acute on chronic cholecystitis requiring a laparascopic subtotal fenestrating cholecystectomy with drain placement.   OT comments  Patient with fair progress toward patient focused goals.  He continues to struggle with balance during mobility, and is needing cues for RW management.  Patient has not been able to progress as quickly as originally anticipated, and given that he does not have the needed 24 hour assist, SNF is recommended for short term rehab to maximize his functional status prior to returning home.  OT will follow in the acute setting to address deficits listed below, and assist with eventual transition to next level of care.      Recommendations for follow up therapy are one component of a multi-disciplinary discharge planning process, led by the attending physician.  Recommendations may be updated based on patient status, additional functional criteria and insurance authorization.    Follow Up Recommendations  Skilled nursing-short term rehab (<3 hours/day)     Assistance Recommended at Discharge Frequent or constant Supervision/Assistance  Patient can return home with the following  Help with stairs or ramp for entrance;A little help with bathing/dressing/bathroom;A little help with walking and/or transfers;Assist for transportation;Assistance with cooking/housework   Equipment Recommendations  None recommended by OT    Recommendations for  Other Services      Precautions / Restrictions Precautions Precautions: Fall Precaution Comments: Diet change DYS 3 Restrictions Weight Bearing Restrictions: No       Mobility Bed Mobility Overal bed mobility: Needs Assistance Bed Mobility: Supine to Sit     Supine to sit: Supervision, HOB elevated          Transfers Overall transfer level: Needs assistance Equipment used: Rolling walker (2 wheels) Transfers: Sit to/from Stand, Bed to chair/wheelchair/BSC Sit to Stand: Min assist     Step pivot transfers: Min guard, Min assist     General transfer comment: continues to lean back     Balance Overall balance assessment: Needs assistance Sitting-balance support: Feet supported, Bilateral upper extremity supported Sitting balance-Leahy Scale: Good     Standing balance support: Reliant on assistive device for balance Standing balance-Leahy Scale: Poor                             ADL either performed or assessed with clinical judgement   ADL       Grooming: Wash/dry hands;Min guard;Standing                   Toilet Transfer: Min guard;Rolling walker (2 wheels);Regular Toilet;Ambulation                  Extremity/Trunk Assessment Upper Extremity Assessment Upper Extremity Assessment: Overall WFL for tasks assessed   Lower Extremity Assessment Lower Extremity Assessment: Defer to PT evaluation   Cervical / Trunk Assessment Cervical / Trunk Assessment: Normal                      Cognition Arousal/Alertness:  Awake/alert Behavior During Therapy: WFL for tasks assessed/performed Overall Cognitive Status: Within Functional Limits for tasks assessed                                 General Comments: Needs cues for RW safety and management                           Pertinent Vitals/ Pain       Pain Assessment Pain Assessment: No/denies pain                                                           Frequency  Min 2X/week        Progress Toward Goals  OT Goals(current goals can now be found in the care plan section)  Progress towards OT goals: Progressing toward goals  Acute Rehab OT Goals OT Goal Formulation: With patient Time For Goal Achievement: 12/21/22 Potential to Achieve Goals: Good  Plan Discharge plan needs to be updated    Co-evaluation                 AM-PAC OT "6 Clicks" Daily Activity     Outcome Measure   Help from another person eating meals?: None Help from another person taking care of personal grooming?: A Little Help from another person toileting, which includes using toliet, bedpan, or urinal?: A Little Help from another person bathing (including washing, rinsing, drying)?: A Little Help from another person to put on and taking off regular upper body clothing?: None Help from another person to put on and taking off regular lower body clothing?: A Little 6 Click Score: 20    End of Session Equipment Utilized During Treatment: Gait belt;Rolling walker (2 wheels)  OT Visit Diagnosis: Unsteadiness on feet (R26.81);Muscle weakness (generalized) (M62.81);History of falling (Z91.81)   Activity Tolerance Patient tolerated treatment well   Patient Left in chair;with call bell/phone within reach;with chair alarm set   Nurse Communication Mobility status        Time: 1140-1201 OT Time Calculation (min): 21 min  Charges: OT General Charges $OT Visit: 1 Visit OT Treatments $Self Care/Home Management : 8-22 mins  12/10/2022  RP, OTR/L  Acute Rehabilitation Services  Office:  [phone]   Metta Clines 12/10/2022, 12:06 PM

## 2022-12-10 NOTE — Progress Notes (Signed)
Speech Language Pathology Treatment: Dysphagia  Patient Details Name: Francisco [name] MRN: [id number] DOB: 08-18-1941 Today's Date: 12/10/2022 Time: 9357-0177 SLP Time Calculation (min) (ACUTE ONLY): 9 min  Assessment / Plan / Recommendation Clinical Impression  Pt was seen for dysphagia treatment after GI interventions. Pt denied any relief with esophageal dilation. He tolerated thin liquids and dysphagia 3 solids without overt s/s of aspiration, but continues to report globus sensation and liquids "coming back up". Pt continues to identify at/just inferior to the sternal notch as the site of the sensation. A modified barium swallow study is recommended to further assess oropharyngeal swallow function and it is scheduled for this morning.    HPI HPI: [name] who presented with weakness and reported symptoms of dysphagia with emesis and c/o water not going past "halfway down". CXR negative for active disease. GI consulted who recommended esophagram, allowance of clear liquids (full liquid diet ordered), likely EGD on 1/23, and possible SLP swallow eval which was subsequently ordered by hospitalist. EGD: 3 cm hiatal hernia, no endoscopic findings to suggest dysphagia. MBS and esophagram recommended. Esophagram 1/24: Narrowed appearance of the distal esophagus. A swallowed 13 mm barium tablet did not pass beyond this point despite a prolonged period of observation. 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 PMH: HTN, HLD, stage 3a CKD, C4 cervical fracture s/p fusion (October, 2023), BPH, mild, nonobstructing, distal esophageal stricture noted on ERCP 07/09/22.      SLP Plan  MBS      Recommendations for follow up therapy are one component of a multi-disciplinary discharge planning process, led by the attending physician.  Recommendations may be updated based on patient status, additional functional criteria and insurance  authorization.    Recommendations  Diet recommendations:  (continue soft diet) Liquids provided via: Cup;Straw Medication Administration: Whole meds with liquid (or srushed; per pt's tolerance) Supervision: Intermittent supervision to cue for compensatory strategies Compensations: Slow rate;Follow solids with liquid Postural Changes and/or Swallow Maneuvers: Seated upright 90 degrees                Oral Care Recommendations: Oral care BID SLP Visit Diagnosis: Dysphagia, unspecified (R13.10) Plan: MBS         Francisco [name], [place], [place] Office number [phone]  Horton Marshall  12/10/2022, 9:44 AM

## 2022-12-10 NOTE — Progress Notes (Signed)
Modified Barium Swallow Progress Note  Patient Details  Name: Francisco [name] MRN: [id number] Date of Birth: 06/29/1941  Today's Date: 12/10/2022  Modified Barium Swallow completed.  Full report located under Chart Review in the Imaging Section.  Brief recommendations include the following:  Clinical Impression  Pt presents with pharyngeal dysphagia characterized by a pharyngeal delay, and reduction in tongue base retraction, pharyngeal stripping, and anterior laryngeal movement. He demonstrated incomplete epiglottic inversion, base of tongue residue, vallecular residue, and posterior pharyngeal wall residue, Secondary swallows were ineffective in siginificantly reducing vallecular residue, but a liquid wash reduced it to a more functional level. He demonstrated penetration (PAS 3, 5) and aspiration (PAS 7,8) with consecutive swallows of thin liquids and with the initial bolus via spoon. Transient penetration (PAS 2; WNL) was observed with consecutive swallows of nectar thick liquids. Trace aspiration inconsistently triggered throat clearing and moderate aspiration resulted in coughing which was moderately effective in expelling aspirated material. Laryngeal invasion was eliminated with use of individual boluses of thin liquids. Visualization of the esophagus revealed stasis of barium in the lower thoracic esophagus and transport of the 57m barium tablet was delayed in this area; esophageal work-up already completed. A dysphagia 3 diet with thin liquids is recommended with observance of swallowing precautions.   Swallow Evaluation Recommendations       SLP Diet Recommendations: Dysphagia 3 (Mech soft) solids;Thin liquid   Liquid Administration via: Cup;Straw   Medication Administration: Whole meds with liquid   Supervision: Patient able to self feed;Intermittent supervision to cue for compensatory strategies   Compensations: Slow rate;Follow solids with liquid;Small sips/bites   Postural  Changes: Seated upright at 90 degrees   Oral Care Recommendations: Oral care BID      Anhad Sheeley I. PHardin Negus MAurora CSmithfieldOffice number [phone] SHorton Marshall1/25/2024,11:00 AM

## 2022-12-10 NOTE — TOC Progression Note (Signed)
Transition of Care [facility]) - Initial/Assessment Note    Patient Details  Name: Francisco [name] MRN: [id number] Date of Birth: 19-Jan-1941  Transition [facility]) CM/SW Contact:    Milinda Antis, [place] Phone Number: 12/10/2022, 10:59 AM  Clinical Narrative:                 LCSW received consult for SNF placement and went to meet with the patient at bedside.  The patient was at a procedure.  LCSW will attempt to meet with patient at a later time.          Patient Goals and CMS Choice            Expected Discharge Plan and Services                                              Prior Living Arrangements/Services                       Activities of Daily Living Home Assistive Devices/Equipment: Cane (specify quad or straight), Walker (specify type) ADL Screening (condition at time of admission) Patient's cognitive ability adequate to safely complete daily activities?: Yes Is the patient deaf or have difficulty hearing?: No Does the patient have difficulty seeing, even when wearing glasses/contacts?: No Does the patient have difficulty concentrating, remembering, or making decisions?: No Patient able to express need for assistance with ADLs?: Yes Does the patient have difficulty dressing or bathing?: No Independently performs ADLs?: Yes (appropriate for developmental age) Communication: Independent Dressing (OT): Needs assistance Is this a change from baseline?: Change from baseline, expected to last >3 days Grooming: Independent Feeding: Independent Bathing: Needs assistance Is this a change from baseline?: Change from baseline, expected to last >3 days Toileting: Needs assistance Is this a change from baseline?: Change from baseline, expected to last >3days In/Out Bed: Needs assistance Is this a change from baseline?: Change from baseline, expected to last >3 days Walks in Home: Independent Does the patient have difficulty walking or climbing  stairs?: Yes Weakness of Legs: Both Weakness of Arms/Hands: Both  Permission Sought/Granted                  Emotional Assessment              Admission diagnosis:  UTI (urinary tract infection) [N39.0] Elevated liver function tests [R79.89] Elevated LFTs [R79.89] Acute kidney injury (nontraumatic) (HCC) [N17.9] Total bilirubin, elevated [R17] Normochromic normocytic anemia [D64.9] Elevated random blood glucose level [R73.09] Urinary tract infection without hematuria, site unspecified [N39.0] Patient Active Problem List   Diagnosis Date Noted   Protein-calorie malnutrition, severe 12/09/2022   Total bilirubin, elevated 12/09/2022   Weight loss 12/08/2022   UTI (urinary tract infection) 12/07/2022   Elevated liver function tests 12/07/2022   Failure to thrive in adult 12/07/2022   Acute kidney injury superimposed on chronic kidney disease ([place]) 12/07/2022   Dysphagia 12/07/2022   Dyslipidemia 12/07/2022   C4 cervical fracture ([place]) 08/18/2022   Bile leak, postoperative    Constipation    Normocytic anemia 07/06/2022   RUQ abdominal pain 07/05/2022   Acute calculous cholecystitis 07/05/2022   Lactic acidosis 05/06/2022   Thrombocytopenia ([place]) 05/06/2022   Chronic kidney disease, stage 3a ([place]) 05/06/2022   Acute cholecystitis with chronic cholecystitis 03/17/2022   Peripheral vascular disease, 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 12/16/2021   Chest pain of uncertain etiology 62/22/9798   BMI 31.0-31.9,adult 09/06/2015   BPH (benign prostatic hyperplasia) 08/06/2015   Asthma, chronic 04/04/2015   Essential hypertension 04/12/2013   GAD (generalized anxiety disorder) 04/12/2013   GERD without esophagitis 04/12/2013   Mixed hyperlipidemia 04/12/2013   PCP:  [name], FNP Pharmacy:   [facility] ([place], [place] [facility] [address] [address] Phone: [phone] Fax:  [place], [address] [place] [address] Phone: [phone] Fax: [place] [address] [place] [address] Phone: [phone] Fax: [phone]     Social Determinants of Health (SDOH) Social History: [place]: No Food Insecurity (12/09/2022)  Housing: Low Risk  (12/09/2022)  Transportation Needs: No Transportation Needs (12/09/2022)  Utilities: Not At Risk (12/09/2022)  Alcohol Screen: Low Risk  (08/08/2021)  Depression (PHQ2-9): Low Risk  (08/14/2022)  Financial Resource Strain: Low Risk  (08/20/2022)  Physical Activity: Inactive (08/08/2021)  Social Connections: Socially Isolated (08/08/2021)  Stress: No Stress Concern Present (08/08/2021)  Tobacco Use: Medium Risk (12/08/2022)   SDOH Interventions:     Readmission Risk Interventions     No data to display

## 2022-12-11 DIAGNOSIS — R627 Adult failure to thrive: Secondary | ICD-10-CM | POA: Diagnosis not present

## 2022-12-11 MED ORDER — POLYETHYLENE GLYCOL 3350 17 G PO PACK
17.0000 g | PACK | Freq: Two times a day (BID) | ORAL | Status: DC
  Administered 2022-12-11 – 2022-12-13 (×2): 17 g via ORAL
  Filled 2022-12-11 (×4): qty 1

## 2022-12-11 MED ORDER — SENNA 8.6 MG PO TABS
1.0000 | ORAL_TABLET | Freq: Every day | ORAL | Status: DC
  Administered 2022-12-11 – 2022-12-14 (×4): 8.6 mg via ORAL
  Filled 2022-12-11 (×4): qty 1

## 2022-12-11 MED ORDER — HYDRALAZINE HCL 20 MG/ML IJ SOLN
5.0000 mg | Freq: Once | INTRAMUSCULAR | Status: AC
  Administered 2022-12-11: 5 mg via INTRAVENOUS
  Filled 2022-12-11: qty 1

## 2022-12-11 NOTE — Progress Notes (Incomplete)
   12/11/22 0514 12/11/22 0543  Vitals  BP (!) 214/97 (!) 191/93  MAP (mmHg) 130 122

## 2022-12-11 NOTE — TOC Progression Note (Addendum)
Transition of Care [facility]) - Initial/Assessment Note    Patient Details  Name: Francisco [name] MRN: [id number] Date of Birth: 01/17/1941  Transition of Care [facility]) CM/SW Contact:    Milinda Antis, [place] Phone Number: 12/11/2022, 10:13 AM  Clinical Narrative:                 Patient does not have any bed offers at this time. LCSW attempted to contact admissions at [facility] ( patient's facility of choice) there was no answer and CSW was unable to leave a VM.    LCSW attempted to call [facility] 3 times today and was unable to speak with anyone or leave a VM.  1500:  LCSW met with the patient at bedside.  The patient request that LCSW speak with daughter [name].  LCSW contacted the patient's daughter. The daughter reports that she spoke with [facility] and the facility can accept the patient.  LCSW called [facility] again.  There was no answer.  1601: LCSW received a call from [facility] in admissions at [facility].  The facility extended a bed offer.   LCSW requested that TOC SWAs begin insurance auth.  TOC following.  Expected Discharge Plan: [place]     Patient Goals and CMS Choice Patient states their goals for this hospitalization and ongoing recovery are:: to go to rehab   Choice offered to / list presented to : Patient      Expected Discharge Plan and Services In-house Referral: Clinical Social Work     Living arrangements for the past 2 months: Single Family Home                                      Prior Living Arrangements/Services Living arrangements for the past 2 months: Single Family Home Lives with:: Self Patient language and need for interpreter reviewed:: Yes Do you feel safe going back to the place where you live?: Yes      Need for Family Participation in Patient Care: Yes (Comment) Care giver support system in place?: Yes (comment)   Criminal Activity/Legal Involvement Pertinent to Current  Situation/Hospitalization: No - Comment as needed  Activities of Daily Living Home Assistive Devices/Equipment: Cane (specify quad or straight), Walker (specify type) ADL Screening (condition at time of admission) Patient's cognitive ability adequate to safely complete daily activities?: Yes Is the patient deaf or have difficulty hearing?: No Does the patient have difficulty seeing, even when wearing glasses/contacts?: No Does the patient have difficulty concentrating, remembering, or making decisions?: No Patient able to express need for assistance with ADLs?: Yes Does the patient have difficulty dressing or bathing?: No Independently performs ADLs?: Yes (appropriate for developmental age) Communication: Independent Dressing (OT): Needs assistance Is this a change from baseline?: Change from baseline, expected to last >3 days Grooming: Independent Feeding: Independent Bathing: Needs assistance Is this a change from baseline?: Change from baseline, expected to last >3 days Toileting: Needs assistance Is this a change from baseline?: Change from baseline, expected to last >3days In/Out Bed: Needs assistance Is this a change from baseline?: Change from baseline, expected to last >3 days Walks in Home: Independent Does the patient have difficulty walking or climbing stairs?: Yes Weakness of Legs: Both Weakness of Arms/Hands: Both  Permission Sought/Granted   Permission granted to share information with : Yes, Verbal Permission Granted     Permission granted to share info  w AGENCY: SNF        Emotional Assessment Appearance:: Appears stated age Attitude/Demeanor/Rapport: Engaged Affect (typically observed): Appropriate Orientation: : Oriented to Situation, Oriented to  Time, Oriented to Place, Oriented to Self Alcohol / Substance Use: Not Applicable Psych Involvement: No (comment)  Admission diagnosis:  UTI (urinary tract infection) [N39.0] Elevated liver function tests  [R79.89] Elevated LFTs [R79.89] Acute kidney injury (nontraumatic) (HCC) [N17.9] Total bilirubin, elevated [R17] Normochromic normocytic anemia [D64.9] Elevated random blood glucose level [R73.09] Urinary tract infection without hematuria, site unspecified [N39.0] Patient Active Problem List   Diagnosis Date Noted   Protein-calorie malnutrition, severe 12/09/2022   Total bilirubin, elevated 12/09/2022   Weight loss 12/08/2022   UTI (urinary tract infection) 12/07/2022   Elevated liver function tests 12/07/2022   Failure to thrive in adult 12/07/2022   Acute kidney injury superimposed on chronic kidney disease ([place]) 12/07/2022   Dysphagia 12/07/2022   Dyslipidemia 12/07/2022   C4 cervical fracture ([place]) 08/18/2022   Bile leak, postoperative    Constipation    Normocytic anemia 07/06/2022   RUQ abdominal pain 07/05/2022   Acute calculous cholecystitis 07/05/2022   Lactic acidosis 05/06/2022   Thrombocytopenia ([place]) 05/06/2022   Chronic kidney disease, stage 3a ([place]) 05/06/2022   Acute cholecystitis with chronic cholecystitis 03/17/2022   Peripheral vascular disease, 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 12/16/2021   Chest pain of uncertain etiology 80/99/8338   BMI 31.0-31.9,adult 09/06/2015   BPH (benign prostatic hyperplasia) 08/06/2015   Asthma, chronic 04/04/2015   Essential hypertension 04/12/2013   GAD (generalized anxiety disorder) 04/12/2013   GERD without esophagitis 04/12/2013   Mixed hyperlipidemia 04/12/2013   PCP:  [name], FNP Pharmacy:   [facility] - [place], [place] - [address] [facility] [address] [place] [address] [place] [address] Phone: [phone] Fax: [phone]  [place], [place] - [address] [place] [place] [address] Phone: [phone] Fax: [place] [address], [place] [address] [place]  [place] [address] Phone: [phone] Fax: [phone]     Social Determinants of Health (SDOH) Social History: SDOH Screenings   Food Insecurity: No Food Insecurity (12/09/2022)  Housing: Low Risk  (12/09/2022)  Transportation Needs: No Transportation Needs (12/09/2022)  Utilities: Not At Risk (12/09/2022)  Alcohol Screen: Low Risk  (08/08/2021)  Depression (PHQ2-9): Low Risk  (08/14/2022)  Financial Resource Strain: Low Risk  (08/20/2022)  Physical Activity: Inactive (08/08/2021)  Social Connections: Socially Isolated (08/08/2021)  Stress: No Stress Concern Present (08/08/2021)  Tobacco Use: Medium Risk (12/08/2022)   SDOH Interventions:     Readmission Risk Interventions     No data to display

## 2022-12-11 NOTE — Progress Notes (Signed)
Physical Therapy Treatment Patient Details Name: Francisco [name] MRN: [id number] DOB: 1941-06-29 Today's Date: 12/11/2022   History of Present Illness Patient is 82 y.o. male presenting with weakness and c/o difficulty swallowing as well as emesis and rigors... PMH significant of HTN, HLD, stage 3a CKD, and BPH. Recently he was last admitted from 10/3-4 with C4 cervical fracture s/p fusion. Pt reports decline since fusion and difficulty eating/swallowing. Prior to that, he was admitted in June for E coli bacteremia and from 8/20-29 with acute on chronic cholecystitis requiring a laparascopic subtotal fenestrating cholecystectomy with drain placement.    PT Comments    Continues to show progress towards acute rehab goals. Progressing from min assist/CGA early in session to min guard with assistive device. Ambulating in hallways, early LOB but improved with training. Pt eager and motivated to work with acute rehab. Focused end of session on static balance exercises which were well tolerated with intermittent min assist. Patient will continue to benefit from skilled physical therapy services to further improve independence with functional mobility.    Recommendations for follow up therapy are one component of a multi-disciplinary discharge planning process, led by the attending physician.  Recommendations may be updated based on patient status, additional functional criteria and insurance authorization.  Follow Up Recommendations  Skilled nursing-short term rehab (<3 hours/day) Can patient physically be transported by private vehicle: Yes   Assistance Recommended at Discharge Intermittent Supervision/Assistance  Patient can return home with the following A little help with walking and/or transfers;A little help with bathing/dressing/bathroom;Assistance with cooking/housework;Help with stairs or ramp for entrance   Equipment Recommendations  None recommended by PT (pretty well-equipped)     Recommendations for Other Services       Precautions / Restrictions Precautions Precautions: Fall Precaution Comments: Diet change DYS 3 Restrictions Weight Bearing Restrictions: No     Mobility  Bed Mobility               General bed mobility comments: In recliner    Transfers Overall transfer level: Needs assistance Equipment used: Rolling walker (2 wheels) Transfers: Sit to/from Stand, Bed to chair/wheelchair/BSC Sit to Stand: Min assist           General transfer comment: CGA for safety, slight posterior lean, using back of knees to brace but good power up. Performed several times and progressed to min guard.    Ambulation/Gait Ambulation/Gait assistance: Min guard, Min assist Gait Distance (Feet): 180 Feet Assistive device: Rolling walker (2 wheels) Gait Pattern/deviations: Step-through pattern, Decreased stride length Gait velocity: decr Gait velocity interpretation: <1.8 ft/sec, indicate of risk for recurrent falls   General Gait Details: Ambulates with min assist early for balance, RW navigation, needs cues to scan enviornment for hazards. Progressed to min guard in more open area of hallway. No buckling noted.   Stairs             Wheelchair Mobility    Modified Rankin (Stroke Patients Only)       Balance Overall balance assessment: Needs assistance Sitting-balance support: Feet supported, Bilateral upper extremity supported Sitting balance-Leahy Scale: Good     Standing balance support: No upper extremity supported, During functional activity Standing balance-Leahy Scale: Fair                 High Level Balance Comments: Balance training with intermittent UE support on RW as needed. Min assist for a couple episodes of instability. Performed romberg stance, semi tandem, alteranting forward toe taps, eyes open eyes closed,  trunk rotation.            Cognition Arousal/Alertness: Awake/alert Behavior During Therapy: WFL for  tasks assessed/performed Overall Cognitive Status: Within Functional Limits for tasks assessed                                 General Comments: Needs cues for RW safety and management        Exercises      General Comments        Pertinent Vitals/Pain Pain Assessment Pain Assessment: No/denies pain    Home Living                          Prior Function            PT Goals (current goals can now be found in the care plan section) Acute Rehab PT Goals Patient Stated Goal: be able to eat easily and keep strength PT Goal Formulation: With patient Time For Goal Achievement: 12/21/22 Potential to Achieve Goals: Good Progress towards PT goals: Progressing toward goals    Frequency    Min 3X/week (keep 3x/wk; might decline SNF)      PT Plan Current plan remains appropriate    Co-evaluation              AM-PAC PT "6 Clicks" Mobility   Outcome Measure  Help needed turning from your back to your side while in a flat bed without using bedrails?: None Help needed moving from lying on your back to sitting on the side of a flat bed without using bedrails?: A Little Help needed moving to and from a bed to a chair (including a wheelchair)?: A Little Help needed standing up from a chair using your arms (e.g., wheelchair or bedside chair)?: A Little Help needed to walk in hospital room?: A Little Help needed climbing 3-5 steps with a railing? : A Little 6 Click Score: 19    End of Session Equipment Utilized During Treatment: Gait belt Activity Tolerance: Patient tolerated treatment well Patient left: in chair;with call bell/phone within reach;with chair alarm set Nurse Communication: Mobility status PT Visit Diagnosis: Difficulty in walking, not elsewhere classified (R26.2);Unsteadiness on feet (R26.81)     Time: 1962-2297 PT Time Calculation (min) (ACUTE ONLY): 19 min  Charges:  $Gait Training: 8-22 mins                     Candie Mile, PT, DPT Physical Therapist Acute Rehabilitation Services [place] [facility]    Ellouise Newer 12/11/2022, 3:23 PM

## 2022-12-11 NOTE — Progress Notes (Signed)
Mobility Specialist Progress Note   12/11/22 1020  Mobility  Activity Ambulated with assistance in hallway (in recliner before and after)  Level of Assistance Contact guard assist, steadying assist  Assistive Device Front wheel walker  Distance Ambulated (ft) 280 ft  Range of Motion/Exercises Active;All extremities  Activity Response Tolerated well  Mobility Referral Yes   Patient received in recliner, eager and motivated to participate. Stood supervision level and ambulated with min guard for safety. Required cueing to maintain proximal distance within walker. Upon returning to room, patient completed self guided LE/UE AROM and exercises. Tolerated without complaint or incident. Was left in recliner with all needs met, call bell in reach.   [place] Eulogia Dismore, BS EXP Mobility Specialist Please contact via SecureChat or Rehab office at [phone]

## 2022-12-11 NOTE — Progress Notes (Signed)
Speech Language Pathology Treatment: Dysphagia  Patient Details Name: Francisco [name] MRN: [id number] DOB: 02/14/1941 Today's Date: 12/11/2022 Time: 2423-5361 SLP Time Calculation (min) (ACUTE ONLY): 15 min  Assessment / Plan / Recommendation Clinical Impression  Pt was seen for dysphagia treatment. He was alert and cooperative during the session. He reported that he has been observing swallowing precautions, but has had some difficulty with reducing bolus size and rate. Pt stated that he continues to have eructation with p.o. intake. Pt tolerated thin liquids and dysphagia 3 solids without overt s/s of aspiration and he was noted to consistently use individual boluses with thin liquids. A dysphagia 3 diet with thin liquids is still recommended and SLP will continue to follow pt.     HPI HPI: [name] who presented with weakness and reported symptoms of dysphagia with emesis and c/o water not going past "halfway down". CXR negative for active disease. GI consulted who recommended esophagram, allowance of clear liquids (full liquid diet ordered), likely EGD on 1/23, and possible SLP swallow eval which was subsequently ordered by hospitalist. EGD: 3 cm hiatal hernia, no endoscopic findings to suggest dysphagia. MBS and esophagram recommended. Esophagram 1/24: Narrowed appearance of the distal esophagus. A swallowed 13 mm barium tablet did not pass beyond this point despite a prolonged period of observation. 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 PMH: HTN, HLD, stage 3a CKD, C4 cervical fracture s/p fusion (October, 2023), BPH, mild, nonobstructing, distal esophageal stricture noted on ERCP 07/09/22.      SLP Plan  Continue with current plan of care      Recommendations for follow up therapy are one component of a multi-disciplinary discharge planning process, led by the attending physician.  Recommendations may be updated  based on patient status, additional functional criteria and insurance authorization.    Recommendations  Diet recommendations: Dysphagia 3 (mechanical soft);Thin liquid Liquids provided via: Cup;Straw Medication Administration: Whole meds with liquid Supervision: Intermittent supervision to cue for compensatory strategies Compensations: Slow rate;Follow solids with liquid Postural Changes and/or Swallow Maneuvers: Seated upright 90 degrees                Oral Care Recommendations: Oral care BID Follow Up Recommendations: Skilled nursing-short term rehab (<3 hours/day) Assistance recommended at discharge: Intermittent Supervision/Assistance SLP Visit Diagnosis: Dysphagia, unspecified (R13.10) Plan: Continue with current plan of care          Francisco [name] I. Francisco [name], [place], [place] Office number [phone]  Horton Marshall  12/11/2022, 10:30 AM

## 2022-12-11 NOTE — Progress Notes (Signed)
PROGRESS NOTE    [name]  OEV:[id number] DOB: March 02, 1941 DOA: 12/07/2022 PCP: [name], FNP   Brief Narrative: 82 year old with past medical history significant for hypertension, hyperlipidemia, CKD stage IIIa, BPH who presented to [name] ED on 1/22 with complaints of generalized weakness, dysphagia with associated nausea vomiting.  Also reported rigors and urinary frequency and dysuria the day prior to admission.  He was admitted August 2023 with acute on chronic cholecystitis requiring laparoscopic cholecystectomy with drain placement complicated by bile leak postoperative required placement of a CBD stent via ERCP.  He presented with transaminases, bilirubin 3.9, abdominal ultrasound cholecystectomy without biliary dilation.  Patient was found to have Klebsiella pneumoniae bacteremia, sepsis and urinary tract infection.  Worsening transaminases and underwent ERCP on 1/23, biliary stent was removed, prior sphincterectomy, prior subtotal cholecystectomy, moderate amount of CBD sludge, biliary tree was swept and sludge was found and removed.   Assessment & Plan:   Principal Problem:   Failure to thrive in adult Active Problems:   Essential hypertension   BPH (benign prostatic hyperplasia)   GAD (generalized anxiety disorder)   UTI (urinary tract infection)   Elevated liver function tests   Acute kidney injury superimposed on chronic kidney disease (HCC)   Dysphagia   Dyslipidemia   Weight loss   Protein-calorie malnutrition, severe   Total bilirubin, elevated  1-Klebsiella pneumonia  bacteremia, sepsis POA Urinary tract infection: -Patient presented with weakness, urinary symptoms, UA positive nitrate, blood cultures positive for Klebsiella pneumoniae Antibiotics changed to Ancef per sensitivity He will need a total of 10 to 14 days treatment, he can be discharged on oral antibiotics  2-Transaminases, hyperbilirubinemia:  History of Subtotal  Cholecystectomy, status post ERCP with biliary stent placement for postoperative bile leak on 06/2022. -Presented with transaminases hyperbilirubinemia -Continue to hold statins -Underwent ERCP 1/23: Biliary stent was removed, prior sphincterectomy, prior subtotal cholecystectomy, moderate amount of CBD sludge, biliary tree was swept and sludge was found and removed. -Liver function test trending down -Appreciate  GI evaluation  Acute renal failure on CKD stage IIIa: Likely secondary to prerenal azotemia in the setting of dehydration versus ATN Creatinine peaked to 1.8--- 10 down to 1.2 Received IV Fluids  Dysphagia/failure to thrive And reports difficulty eating solids and sometimes liquids. Underwent EGD 1/23 with no significant finding but dilation perform Barium esophagogram 1/24: Limited study,  Narrowed appearance of the distal esophagus. A swallowed 13 mm barium tablet did not pass beyond this point despite a prolonged period of observation. 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 Soft diet trial.  On PPI.  Oral pharyngeal dysphagia.  Dysphagia 3 diet. Monitor on current diet.  Report tolerating diet.   Severe protein malnutrition: Continue with Ensure   Nutrition Problem: Severe Malnutrition Etiology: chronic illness    Signs/Symptoms: energy intake < 75% for > or equal to 1 month, percent weight loss Percent weight loss: 19 % (over 5 months)    Interventions: Ensure Enlive (each supplement provides 350kcal and 20 grams of protein)  Estimated body mass index is 24.32 kg/m as calculated from the following:   Height as of this encounter: '5\' 9"'$  (1.753 m).   Weight as of this encounter: 74.7 kg.   DVT prophylaxis: SCDs Code Status: DNR Family Communication: Care discussed with patient Disposition Plan:  Status is: Inpatient Remains inpatient appropriate because: stable for SNF    Consultants:   GI  Procedures:  ERCP  Antimicrobials:  Subjective: He hasn't had BM in several days. Laxatives has been ordered.  He has been tolerating diet, he needs to remain himself to eat slow.   Objective: Vitals:   12/11/22 0514 12/11/22 0543 12/11/22 0628 12/11/22 0831  BP: (!) 214/97 (!) 191/93 (!) 164/75 (!) 165/86  Pulse: 68 67 70 76  Resp: 14   18  Temp: (!) 97.4 F (36.3 C)   98.8 F (37.1 C)  TempSrc: Oral   Oral  SpO2: 98%   99%  Weight:      Height:        Intake/Output Summary (Last 24 hours) at 12/11/2022 1553 Last data filed at 12/11/2022 1300 Gross per 24 hour  Intake 1825.44 ml  Output 950 ml  Net 875.44 ml    Filed Weights   12/07/22 0058 12/07/22 1302  Weight: 77.1 kg 74.7 kg    Examination:  General exam: NAD Respiratory system: CTA Cardiovascular system: S 1, S2  RRR Gastrointestinal system: BS present, soft, nt Central nervous system: Alert, follows command Extremities: no edema   Data Reviewed: I have personally reviewed following labs and imaging studies  CBC: Recent Labs  Lab 12/07/22 0111 12/08/22 0653 12/09/22 0407  WBC 15.8* 7.4 7.8  NEUTROABS 14.8*  --   --   HGB 12.2* 10.5* 10.9*  HCT 37.5* 32.3* 32.0*  MCV 97.7 97.6 95.8  PLT 246 206 237    Basic Metabolic Panel: Recent Labs  Lab 12/07/22 0111 12/08/22 0653 12/09/22 0407  NA 136 135 135  K 4.0 4.0 4.2  CL 100 101 99  CO2 '24 25 28  '$ GLUCOSE 218* 127* 105*  BUN '20 12 11  '$ CREATININE 1.88* 1.22 1.22  CALCIUM 8.9 8.4* 8.4*    GFR: Estimated Creatinine Clearance: 47.5 mL/min (by C-G formula based on SCr of 1.22 mg/dL). Liver Function Tests: Recent Labs  Lab 12/07/22 0111 12/08/22 0653 12/09/22 0407  AST 90* 38 29  ALT 130* 74* 59*  ALKPHOS 411* 264* 227*  BILITOT 3.9* 1.3* 1.1  PROT 6.6 5.2* 5.0*  ALBUMIN 3.2* 2.5* 2.5*    No results for input(s): "LIPASE", "AMYLASE" in the last 168 hours. No results for input(s): "AMMONIA" in the last 168  hours. Coagulation Profile: No results for input(s): "INR", "PROTIME" in the last 168 hours. Cardiac Enzymes: No results for input(s): "CKTOTAL", "CKMB", "CKMBINDEX", "TROPONINI" in the last 168 hours. BNP (last 3 results) No results for input(s): "PROBNP" in the last 8760 hours. HbA1C: No results for input(s): "HGBA1C" in the last 72 hours. CBG: No results for input(s): "GLUCAP" in the last 168 hours. Lipid Profile: No results for input(s): "CHOL", "HDL", "LDLCALC", "TRIG", "CHOLHDL", "LDLDIRECT" in the last 72 hours. Thyroid Function Tests: No results for input(s): "TSH", "T4TOTAL", "FREET4", "T3FREE", "THYROIDAB" in the last 72 hours. Anemia Panel: No results for input(s): "VITAMINB12", "FOLATE", "FERRITIN", "TIBC", "IRON", "RETICCTPCT" in the last 72 hours. Sepsis Labs: Recent Labs  Lab 12/07/22 0111  LATICACIDVEN 1.2     Recent Results (from the past 240 hour(s))  Blood culture (routine x 2)     Status: Abnormal   Collection Time: 12/07/22  1:11 AM   Specimen: BLOOD RIGHT ARM  Result Value Ref Range Status   Specimen Description BLOOD RIGHT ARM  Final   Special Requests   Final    BOTTLES DRAWN AEROBIC AND ANAEROBIC Blood Culture results may not be optimal due to an excessive volume of blood received in culture bottles   Culture  Setup Time  Final    ANAEROBIC BOTTLE ONLY GRAM NEGATIVE RODS CRITICAL RESULT CALLED TO, READ BACK BY AND VERIFIED WITH:  C/ PHARMD E. MARTIN 12/07/22 1518 A. LAFRANCE    Culture KLEBSIELLA PNEUMONIAE (A)  Final   Report Status 12/09/2022 FINAL  Final   Organism ID, Bacteria KLEBSIELLA PNEUMONIAE  Final      Susceptibility   Klebsiella pneumoniae - MIC*    AMPICILLIN RESISTANT Resistant     CEFAZOLIN <=4 SENSITIVE Sensitive     CEFEPIME <=0.12 SENSITIVE Sensitive     CEFTAZIDIME <=1 SENSITIVE Sensitive     CEFTRIAXONE <=0.25 SENSITIVE Sensitive     CIPROFLOXACIN <=0.25 SENSITIVE Sensitive     GENTAMICIN <=1 SENSITIVE Sensitive      IMIPENEM <=0.25 SENSITIVE Sensitive     TRIMETH/SULFA <=20 SENSITIVE Sensitive     AMPICILLIN/SULBACTAM 4 SENSITIVE Sensitive     PIP/TAZO <=4 SENSITIVE Sensitive     * KLEBSIELLA PNEUMONIAE  Urine Culture     Status: Abnormal   Collection Time: 12/07/22  1:11 AM   Specimen: Urine, Clean Catch  Result Value Ref Range Status   Specimen Description URINE, CLEAN CATCH  Final   Special Requests NONE  Final   Culture (A)  Final    <10,000 COLONIES/mL INSIGNIFICANT GROWTH Performed at [facility], [address], [place], [place] [address]    Report Status 12/08/2022 FINAL  Final  Resp panel by RT-PCR (RSV, Flu A&B, Covid) Anterior Nasal Swab     Status: None   Collection Time: 12/07/22  1:11 AM   Specimen: Anterior Nasal Swab  Result Value Ref Range Status   SARS Coronavirus 2 by RT PCR NEGATIVE NEGATIVE Final    Comment: (NOTE) SARS-CoV-2 target nucleic acids are NOT DETECTED.  The SARS-CoV-2 RNA is generally detectable in upper respiratory specimens during the acute phase of infection. The lowest concentration of SARS-CoV-2 viral copies this assay can detect is 138 copies/mL. A negative result does not preclude SARS-Cov-2 infection and should not be used as the sole basis for treatment or other patient management decisions. A negative result may occur with  improper specimen collection/handling, submission of specimen other than nasopharyngeal swab, presence of viral mutation(s) within the areas targeted by this assay, and inadequate number of viral copies(<138 copies/mL). A negative result must be combined with clinical observations, patient history, and epidemiological information. The expected result is Negative.  Fact Sheet for Patients:  EntrepreneurPulse.com.au  Fact Sheet for Healthcare Providers:  IncredibleEmployment.be  This test is no t yet approved or cleared by the [place] FDA and  has been authorized for detection  and/or diagnosis of SARS-CoV-2 by FDA under an Emergency Use Authorization (EUA). This EUA will remain  in effect (meaning this test can be used) for the duration of the COVID-19 declaration under Section 564(b)(1) of the Act, 21 U.S.C.section 360bbb-3(b)(1), unless the authorization is terminated  or revoked sooner.       Influenza A by PCR NEGATIVE NEGATIVE Final   Influenza B by PCR NEGATIVE NEGATIVE Final    Comment: (NOTE) The Xpert Xpress SARS-CoV-2/FLU/RSV plus assay is intended as an aid in the diagnosis of influenza from Nasopharyngeal swab specimens and should not be used as a sole basis for treatment. Nasal washings and aspirates are unacceptable for Xpert Xpress SARS-CoV-2/FLU/RSV testing.  Fact Sheet for Patients: EntrepreneurPulse.com.au  Fact Sheet for Healthcare Providers: IncredibleEmployment.be  This test is not yet approved or cleared by the [place] and has been authorized for  detection and/or diagnosis of SARS-CoV-2 by FDA under an Emergency Use Authorization (EUA). This EUA will remain in effect (meaning this test can be used) for the duration of the COVID-19 declaration under Section 564(b)(1) of the Act, 21 U.S.C. section 360bbb-3(b)(1), unless the authorization is terminated or revoked.     Resp Syncytial Virus by PCR NEGATIVE NEGATIVE Final    Comment: (NOTE) Fact Sheet for Patients: EntrepreneurPulse.com.au  Fact Sheet for Healthcare Providers: IncredibleEmployment.be  This test is not yet approved or cleared by the [place] FDA and has been authorized for detection and/or diagnosis of SARS-CoV-2 by FDA under an Emergency Use Authorization (EUA). This EUA will remain in effect (meaning this test can be used) for the duration of the COVID-19 declaration under Section 564(b)(1) of the Act, 21 U.S.C. section 360bbb-3(b)(1), unless the authorization is terminated  or revoked.  Performed at [facility], [place] [address], [place], [place] [address]   Blood Culture ID Panel (Reflexed)     Status: Abnormal   Collection Time: 12/07/22  1:11 AM  Result Value Ref Range Status   Enterococcus faecalis NOT DETECTED NOT DETECTED Final   Enterococcus Faecium NOT DETECTED NOT DETECTED Final   Listeria monocytogenes NOT DETECTED NOT DETECTED Final   Staphylococcus species NOT DETECTED NOT DETECTED Final   Staphylococcus aureus (BCID) NOT DETECTED NOT DETECTED Final   Staphylococcus epidermidis NOT DETECTED NOT DETECTED Final   Staphylococcus lugdunensis NOT DETECTED NOT DETECTED Final   Streptococcus species NOT DETECTED NOT DETECTED Final   Streptococcus agalactiae NOT DETECTED NOT DETECTED Final   Streptococcus pneumoniae NOT DETECTED NOT DETECTED Final   Streptococcus pyogenes NOT DETECTED NOT DETECTED Final   A.calcoaceticus-baumannii NOT DETECTED NOT DETECTED Final   Bacteroides fragilis NOT DETECTED NOT DETECTED Final   Enterobacterales DETECTED (A) NOT DETECTED Final    Comment: Enterobacterales represent a large order of gram negative bacteria, not a single organism. CRITICAL RESULT CALLED TO, READ BACK BY AND VERIFIED WITH:  C/ PHARMD E. MARTIN 12/07/22 1518 A. LAFRANCE    Enterobacter cloacae complex NOT DETECTED NOT DETECTED Final   Escherichia coli NOT DETECTED NOT DETECTED Final   Klebsiella aerogenes NOT DETECTED NOT DETECTED Final   Klebsiella oxytoca NOT DETECTED NOT DETECTED Final   Klebsiella pneumoniae DETECTED (A) NOT DETECTED Final    Comment: CRITICAL RESULT CALLED TO, READ BACK BY AND VERIFIED WITH:  C/ PHARMD E. MARTIN 12/07/22 1518 A. LAFRANCE    Proteus species NOT DETECTED NOT DETECTED Final   Salmonella species NOT DETECTED NOT DETECTED Final   Serratia marcescens NOT DETECTED NOT DETECTED Final   Haemophilus influenzae NOT DETECTED NOT DETECTED Final   Neisseria meningitidis NOT DETECTED NOT DETECTED Final    Pseudomonas aeruginosa NOT DETECTED NOT DETECTED Final   Stenotrophomonas maltophilia NOT DETECTED NOT DETECTED Final   Candida albicans NOT DETECTED NOT DETECTED Final   Candida auris NOT DETECTED NOT DETECTED Final   Candida glabrata NOT DETECTED NOT DETECTED Final   Candida krusei NOT DETECTED NOT DETECTED Final   Candida parapsilosis NOT DETECTED NOT DETECTED Final   Candida tropicalis NOT DETECTED NOT DETECTED Final   Cryptococcus neoformans/gattii NOT DETECTED NOT DETECTED Final   CTX-M ESBL NOT DETECTED NOT DETECTED Final   Carbapenem resistance IMP NOT DETECTED NOT DETECTED Final   Carbapenem resistance KPC NOT DETECTED NOT DETECTED Final   Carbapenem resistance NDM NOT DETECTED NOT DETECTED Final   Carbapenem resist OXA 48 LIKE NOT DETECTED NOT DETECTED Final  Carbapenem resistance VIM NOT DETECTED NOT DETECTED Final    Comment: Performed at [facility], [place] [address] [place], [place] [address]  Blood culture (routine x 2)     Status: Abnormal   Collection Time: 12/07/22  3:07 AM   Specimen: BLOOD LEFT HAND  Result Value Ref Range Status   Specimen Description BLOOD LEFT HAND  Final   Special Requests   Final    BOTTLES DRAWN AEROBIC AND ANAEROBIC Blood Culture adequate volume   Culture  Setup Time   Final    GRAM NEGATIVE RODS IN BOTH AEROBIC AND ANAEROBIC BOTTLES CRITICAL RESULT CALLED TO, READ BACK BY AND VERIFIED WITH: PHARMD KAILEY MIKOLICHEK ON 03/04/61 @ 2297 BY DRT    Culture (A)  Final    KLEBSIELLA PNEUMONIAE SUSCEPTIBILITIES PERFORMED ON PREVIOUS CULTURE WITHIN THE LAST 5 DAYS. Performed at [facility], [place] [address] [place], [place] [address]    Report Status 12/09/2022 FINAL  Final         Radiology Studies: DG Swallowing Func-Speech Pathology  Result Date: 12/10/2022 Table formatting from the original result was not included. 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  Procedure: REVISION AMPUTATION LEFT INDEX FINGER REPAIR LACERATION LEFT THUMB;  Surgeon: [name], MD;  Location: [facility];  Service: Orthopedics;  Laterality: Left;  BILIARY STENT PLACEMENT N/A 07/09/2022  Procedure: BILIARY STENT PLACEMENT;  Surgeon: Ladene Artist, MD;  Location: [facility];  Service: Gastroenterology;  Laterality: N/A;  CHOLECYSTECTOMY N/A 07/05/2022  Procedure: LAPAROSCOPIC CHOLECYSTECTOMY;  Surgeon: Dwan Bolt, MD;  Location: [facility];  Service: General;  Laterality: N/A;  ENDOSCOPIC RETROGRADE CHOLANGIOPANCREATOGRAPHY (ERCP) WITH PROPOFOL N/A 12/08/2022  Procedure: ENDOSCOPIC RETROGRADE CHOLANGIOPANCREATOGRAPHY (ERCP) WITH PROPOFOL;  Surgeon: Ladene Artist, MD;  Location: [facility];  Service: Gastroenterology;  Laterality: N/A;  ERCP N/A 07/09/2022  Procedure: ENDOSCOPIC RETROGRADE CHOLANGIOPANCREATOGRAPHY (ERCP);  Surgeon: Ladene Artist, MD;  Location: [facility];  Service: Gastroenterology;  Laterality: N/A;  ESOPHAGOGASTRODUODENOSCOPY (EGD) WITH PROPOFOL N/A 12/08/2022  Procedure: ESOPHAGOGASTRODUODENOSCOPY (EGD) WITH PROPOFOL;  Surgeon: Mauri Pole, MD;  Location: [facility];  Service: Gastroenterology;  Laterality: N/A;  POSTERIOR CERVICAL FUSION/FORAMINOTOMY N/A 08/18/2022  Procedure: C4-5 Posterior cervical fusion with lateral mass fixation;  Surgeon: Ashok Pall, MD;  Location: [facility];  Service: Neurosurgery;  Laterality: N/A;  REMOVAL OF STONES  12/08/2022  Procedure: REMOVAL OF STONES;  Surgeon: Ladene Artist, MD;  Location: [facility];  Service: Gastroenterology;;  [name]  07/09/2022  Procedure: [id number];  Surgeon: Ladene Artist, MD;  Location: [facility];  Service: Gastroenterology;;  [place]  12/08/2022  Procedure: STENT REMOVAL;  Surgeon: Ladene Artist, MD;  Location: [facility];  Service: Gastroenterology;; HPI: [name] who presented with weakness and reported symptoms of dysphagia with emesis and c/o water not going past "halfway down". CXR negative for active disease. GI consulted who recommended esophagram, allowance of clear liquids (full liquid diet ordered), likely EGD on 1/23, and possible SLP swallow  eval which was subsequently ordered by hospitalist. EGD: 3 cm hiatal hernia, no endoscopic findings to suggest dysphagia. MBS and esophagram recommended. Esophagram 1/24: Narrowed appearance of the distal esophagus. A swallowed 13 mm barium tablet did not pass beyond this point despite a prolonged period of observation. 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 PMH: HTN, HLD, stage 3a CKD, C4 cervical fracture s/p fusion (October, 2023), BPH, mild, nonobstructing, distal esophageal stricture noted on ERCP 07/09/22.  No data recorded  Recommendations for follow up therapy are one component of a multi-disciplinary discharge planning process, led by the attending physician.  Recommendations may be updated based on patient status, additional functional criteria and insurance authorization. Assessment / Plan / Recommendation   12/10/2022  10:31 AM Clinical Impressions Clinical Impression Pt presents with pharyngeal dysphagia characterized by a pharyngeal delay, and reduction in tongue base retraction, pharyngeal stripping, and anterior laryngeal movement. He demonstrated incomplete epiglottic inversion, base of tongue residue, vallecular residue, and posterior pharyngeal wall residue, Secondary swallows were  ineffective in siginificantly reducing vallecular residue, but a liquid wash reduced it to a more functional level. He demonstrated penetration (PAS 3, 5) and aspiration (PAS 7,8) with consecutive swallows of thin liquids and with the initial bolus via spoon. Transient penetration (PAS 2; WNL) was observed with consecutive swallows of nectar thick liquids. Trace aspiration inconsistently triggered throat clearing and moderate aspiration resulted in coughing which was moderately effective in expelling aspirated material. Laryngeal invasion was eliminated with use of individual boluses of thin liquids. Visualization of the esophagus revealed stasis of barium in the lower thoracic esophagus and transport of the 92m barium tablet was delayed in this area; esophageal work-up already completed. A dysphagia 3 diet with thin liquids is recommended with observance of swallowing precautions. SLP Visit Diagnosis Dysphagia, pharyngeal phase (R13.13) Impact on safety and function Mild aspiration risk     12/10/2022  10:31 AM Treatment Recommendations Treatment Recommendations Therapy as outlined in treatment plan below     12/10/2022  10:31 AM Prognosis Prognosis for Safe Diet Advancement Good   12/10/2022  10:31 AM Diet Recommendations SLP Diet Recommendations Dysphagia 3 (Mech soft) solids;Thin liquid Liquid Administration via Cup;Straw Medication Administration Whole meds with liquid Compensations Slow rate;Follow solids with liquid;Small sips/bites Postural Changes Seated upright at 90 degrees     12/10/2022  10:31 AM Other Recommendations Oral Care Recommendations Oral care BID Follow Up Recommendations Skilled nursing-short term rehab (<3 hours/day) Functional Status Assessment Patient has had a recent decline in their functional status and demonstrates the ability to make significant improvements in function in a reasonable and predictable amount of time.   12/10/2022  10:31 AM Frequency and Duration  Speech Therapy Frequency  (ACUTE ONLY) min 2x/week Treatment Duration 2 weeks     12/10/2022  10:31 AM Oral Phase [facility]   12/10/2022  10:31 AM Pharyngeal Phase Pharyngeal Phase Impaired Pharyngeal- Nectar Cup Reduced anterior laryngeal mobility;Reduced epiglottic inversion;Reduced pharyngeal peristalsis;Delayed swallow initiation-vallecula;Delayed swallow initiation-pyriform sinuses;Penetration/Aspiration during swallow;Pharyngeal residue - valleculae;Pharyngeal residue - posterior pharnyx;Pharyngeal residue - pyriform Pharyngeal Material enters airway, remains ABOVE vocal cords then ejected out Pharyngeal- Thin Teaspoon Reduced anterior laryngeal mobility;Reduced epiglottic inversion;Reduced pharyngeal peristalsis;Delayed swallow initiation-vallecula;Delayed swallow initiation-pyriform sinuses;Pharyngeal residue - valleculae;Pharyngeal residue - posterior pharnyx;Pharyngeal residue - pyriform;Penetration/Aspiration before swallow Pharyngeal Material enters airway, passes BELOW cords without attempt by patient to eject out (silent aspiration);Material enters airway, passes BELOW cords and not ejected out despite cough attempt by patient Pharyngeal-  Thin Cup Reduced anterior laryngeal mobility;Reduced epiglottic inversion;Reduced pharyngeal peristalsis;Delayed swallow initiation-vallecula;Delayed swallow initiation-pyriform sinuses;Penetration/Aspiration during swallow;Pharyngeal residue - valleculae;Pharyngeal residue - posterior pharnyx;Pharyngeal residue - pyriform;Penetration/Aspiration before swallow Pharyngeal Material enters airway, passes BELOW cords and not ejected out despite cough attempt by patient;Material enters airway, remains ABOVE vocal cords and not ejected out;Material enters airway, CONTACTS cords and not ejected out Pharyngeal- Thin Straw Reduced anterior laryngeal mobility;Reduced epiglottic inversion;Reduced pharyngeal peristalsis;Delayed swallow initiation-vallecula;Delayed swallow initiation-pyriform  sinuses;Penetration/Aspiration during swallow;Pharyngeal residue - valleculae;Pharyngeal residue - posterior pharnyx;Pharyngeal residue - pyriform Pharyngeal Material enters airway, remains ABOVE vocal cords and not ejected out Pharyngeal- Puree Reduced anterior laryngeal mobility;Reduced epiglottic inversion;Reduced pharyngeal peristalsis;Delayed swallow initiation-vallecula;Delayed swallow initiation-pyriform sinuses;Pharyngeal residue - valleculae;Pharyngeal residue - posterior pharnyx;Pharyngeal residue - pyriform Pharyngeal- Regular Reduced anterior laryngeal mobility;Reduced epiglottic inversion;Reduced pharyngeal peristalsis;Delayed swallow initiation-vallecula;Delayed swallow initiation-pyriform sinuses;Pharyngeal residue - valleculae;Pharyngeal residue - posterior pharnyx;Pharyngeal residue - pyriform Pharyngeal- Pill Reduced anterior laryngeal mobility;Reduced epiglottic inversion;Reduced pharyngeal peristalsis;Delayed swallow initiation-vallecula;Delayed swallow initiation-pyriform sinuses;Pharyngeal residue - valleculae;Pharyngeal residue - posterior pharnyx;Pharyngeal residue - pyriform    12/10/2022  10:31 AM Cervical Esophageal Phase  Cervical Esophageal Phase [facility] Shanika I. Hardin Negus, [place], [place] Office number [phone] Horton Marshall 12/10/2022, 11:05 AM                          Scheduled Meds:  aspirin  81 mg Oral Daily   escitalopram  20 mg Oral Daily   feeding supplement  237 mL Oral TID BM   metoprolol succinate  25 mg Oral BID   mouth rinse  15 mL Mouth Rinse 4 times per day   pantoprazole  40 mg Oral Daily   polyethylene glycol  17 g Oral BID   senna  1 tablet Oral Daily   tamsulosin  0.4 mg Oral Daily   Continuous Infusions:  sodium chloride Stopped (12/08/22 0624)    ceFAZolin (ANCEF) IV 2 g (12/11/22 1445)   metronidazole 500 mg (12/11/22 1014)     LOS: 4 days    Time spent: 35 minutes    Bobie Caris A Suzy Kugel, MD [facility]   If 7PM-7AM, please contact night-coverage [email or web address]  12/11/2022, 3:53 PM

## 2022-12-12 DIAGNOSIS — R17 Unspecified jaundice: Secondary | ICD-10-CM | POA: Diagnosis not present

## 2022-12-12 DIAGNOSIS — R7989 Other specified abnormal findings of blood chemistry: Secondary | ICD-10-CM | POA: Diagnosis not present

## 2022-12-12 DIAGNOSIS — N390 Urinary tract infection, site not specified: Secondary | ICD-10-CM | POA: Diagnosis not present

## 2022-12-12 DIAGNOSIS — R1310 Dysphagia, unspecified: Secondary | ICD-10-CM

## 2022-12-12 DIAGNOSIS — R627 Adult failure to thrive: Secondary | ICD-10-CM | POA: Diagnosis not present

## 2022-12-12 LAB — CBC
HCT: 34.5 % — ABNORMAL LOW (ref 39.0–52.0)
Hemoglobin: 12.1 g/dL — ABNORMAL LOW (ref 13.0–17.0)
MCH: 32.4 pg (ref 26.0–34.0)
MCHC: 35.1 g/dL (ref 30.0–36.0)
MCV: 92.2 fL (ref 80.0–100.0)
Platelets: 221 10*3/uL (ref 150–400)
RBC: 3.74 MIL/uL — ABNORMAL LOW (ref 4.22–5.81)
RDW: 13.3 % (ref 11.5–15.5)
WBC: 6.6 10*3/uL (ref 4.0–10.5)
nRBC: 0 % (ref 0.0–0.2)

## 2022-12-12 LAB — COMPREHENSIVE METABOLIC PANEL
ALT: 21 U/L (ref 0–44)
AST: 33 U/L (ref 15–41)
Albumin: 3 g/dL — ABNORMAL LOW (ref 3.5–5.0)
Alkaline Phosphatase: 184 U/L — ABNORMAL HIGH (ref 38–126)
Anion gap: 10 (ref 5–15)
BUN: 11 mg/dL (ref 8–23)
CO2: 25 mmol/L (ref 22–32)
Calcium: 8.5 mg/dL — ABNORMAL LOW (ref 8.9–10.3)
Chloride: 100 mmol/L (ref 98–111)
Creatinine, Ser: 1.45 mg/dL — ABNORMAL HIGH (ref 0.61–1.24)
GFR, Estimated: 48 mL/min — ABNORMAL LOW (ref >60)
Glucose, Bld: 154 mg/dL — ABNORMAL HIGH (ref 70–99)
Potassium: 3.9 mmol/L (ref 3.5–5.1)
Sodium: 135 mmol/L (ref 135–145)
Total Bilirubin: 0.7 mg/dL (ref 0.3–1.2)
Total Protein: 5.5 g/dL — ABNORMAL LOW (ref 6.5–8.1)

## 2022-12-12 MED ORDER — LACTATED RINGERS IV SOLN: INTRAVENOUS | Status: AC

## 2022-12-12 NOTE — Progress Notes (Signed)
Mobility Specialist Progress Note:   12/12/22 1036  Mobility  Activity Ambulated with assistance in hallway  Level of Assistance Standby assist, set-up cues, supervision of patient - no hands on  Assistive Device Front wheel walker  Distance Ambulated (ft) 400 ft  Activity Response Tolerated well;RN notified  Mobility Referral Yes  $Mobility charge 1 Mobility   Pt received in bed and agreeable. No c/o dizziness throughout. Pt pleasantly confused, asking "do I turn left or right" while walking in a straight hall with a dead end. Pt knew what room he was in but could not navigate hallway to find it. Pt returned to bed with all needs met and call bell in reach. RN aware.   Andrey Campanile Mobility Specialist Please contact via SecureChat or  Rehab office at [phone]

## 2022-12-12 NOTE — Progress Notes (Signed)
PROGRESS NOTE    Francisco [name]  QGB:[id number] DOB: [birth year] DOA: 12/07/2022 PCP: [name], FNP   Brief Narrative: 82 year old with past medical history significant for hypertension, hyperlipidemia, CKD stage IIIa, BPH who presented to [name] ED on 1/22 with complaints of generalized weakness, dysphagia with associated nausea vomiting.  Also reported rigors and urinary frequency and dysuria the day prior to admission.  He was admitted August 2023 with acute on chronic cholecystitis requiring laparoscopic cholecystectomy with drain placement complicated by bile leak postoperative required placement of a CBD stent via ERCP.  He presented with transaminases, bilirubin 3.9, abdominal ultrasound cholecystectomy without biliary dilation.  Patient was found to have Klebsiella pneumoniae bacteremia, sepsis and urinary tract infection.  Worsening transaminases and underwent ERCP on 1/23, biliary stent was removed, prior sphincterectomy, prior subtotal cholecystectomy, moderate amount of CBD sludge, biliary tree was swept and sludge was found and removed.   Assessment & Plan:   Principal Problem:   Failure to thrive in adult Active Problems:   Essential hypertension   BPH (benign prostatic hyperplasia)   GAD (generalized anxiety disorder)   UTI (urinary tract infection)   Elevated liver function tests   Acute kidney injury superimposed on chronic kidney disease (HCC)   Dysphagia   Dyslipidemia   Weight loss   Protein-calorie malnutrition, severe   Total bilirubin, elevated  1-Klebsiella pneumonia  bacteremia, sepsis POA Urinary tract infection: -Patient presented with weakness, urinary symptoms, UA positive nitrate, blood cultures positive for Klebsiella pneumoniae Antibiotics changed to Ancef per sensitivity He will need a total of 10 to 14 days treatment, he can be discharged on oral antibiotics Stable.   2-Transaminases, hyperbilirubinemia:  History of Subtotal  Cholecystectomy, status post ERCP with biliary stent placement for postoperative bile leak on 06/2022. -Presented with transaminases hyperbilirubinemia -Continue to hold statins -Underwent ERCP 1/23: Biliary stent was removed, prior sphincterectomy, prior subtotal cholecystectomy, moderate amount of CBD sludge, biliary tree was swept and sludge was found and removed. -Liver function test trending down -Appreciate  GI evaluation  Acute renal failure on CKD stage IIIa: Likely secondary to prerenal azotemia in the setting of dehydration versus ATN Creatinine peaked to 1.8--- 10 down to 1.2 Received IV Fluids  Dysphagia/failure to thrive And reports difficulty eating solids and sometimes liquids. Underwent EGD 1/23 with no significant finding but dilation perform Barium esophagogram 1/24: Limited study,  Narrowed appearance of the distal esophagus. A swallowed 13 mm barium tablet did not pass beyond this point despite a prolonged period of observation. 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 Soft diet trial.  On PPI.  Oral pharyngeal dysphagia.  Dysphagia 3 diet. Monitor on current diet.  Report tolerating diet.   Severe protein malnutrition: Continue with Ensure   Nutrition Problem: Severe Malnutrition Etiology: chronic illness    Signs/Symptoms: energy intake < 75% for > or equal to 1 month, percent weight loss Percent weight loss: 19 % (over 5 months)    Interventions: Ensure Enlive (each supplement provides 350kcal and 20 grams of protein)  Estimated body mass index is 24.09 kg/m as calculated from the following:   Height as of this encounter: '5\' 9"'$  (1.753 m).   Weight as of this encounter: 74 kg.   DVT prophylaxis: SCDs Code Status: DNR Family Communication: Care discussed with patient Disposition Plan:  Status is: Inpatient Remains inpatient appropriate because: stable for SNF    Consultants:   GI  Procedures:  ERCP  Antimicrobials:  Subjective: He is doing well, taking a nap. No new complaints. Had BM yesterday   Objective: Vitals:   12/11/22 1648 12/11/22 2028 12/12/22 0558 12/12/22 1000  BP: 98/60 114/71 (!) 181/84 (!) 148/69  Pulse: 66 65 70 63  Resp: '17 17 18 18  '$ Temp: 98 F (36.7 C) 98 F (36.7 C) 98.2 F (36.8 C) 98 F (36.7 C)  TempSrc: Oral Oral Oral Oral  SpO2: 100% 98% 97% 100%  Weight:  74 kg    Height:  '5\' 9"'$  (1.753 m)      Intake/Output Summary (Last 24 hours) at 12/12/2022 1503 Last data filed at 12/12/2022 1100 Gross per 24 hour  Intake 540 ml  Output 0 ml  Net 540 ml    Filed Weights   12/07/22 0058 12/07/22 1302 12/11/22 2028  Weight: 77.1 kg 74.7 kg 74 kg    Examination:  General exam: NAD Respiratory system: CTA Cardiovascular system: S 1, S 2 RRR Gastrointestinal system: BS present, soft, nt Central nervous system: Alert, follows command Extremities: no edema   Data Reviewed: I have personally reviewed following labs and imaging studies  CBC: Recent Labs  Lab 12/07/22 0111 12/08/22 0653 12/09/22 0407 12/12/22 0725  WBC 15.8* 7.4 7.8 6.6  NEUTROABS 14.8*  --   --   --   HGB 12.2* 10.5* 10.9* 12.1*  HCT 37.5* 32.3* 32.0* 34.5*  MCV 97.7 97.6 95.8 92.2  PLT 246 206 215 654    Basic Metabolic Panel: Recent Labs  Lab 12/07/22 0111 12/08/22 0653 12/09/22 0407 12/12/22 0725  NA 136 135 135 135  K 4.0 4.0 4.2 3.9  CL 100 101 99 100  CO2 '24 25 28 25  '$ GLUCOSE 218* 127* 105* 154*  BUN '20 12 11 11  '$ CREATININE 1.88* 1.22 1.22 1.45*  CALCIUM 8.9 8.4* 8.4* 8.5*    GFR: Estimated Creatinine Clearance: 40 mL/min (A) (by C-G formula based on SCr of 1.45 mg/dL (H)). Liver Function Tests: Recent Labs  Lab 12/07/22 0111 12/08/22 0653 12/09/22 0407 12/12/22 0725  AST 90* 38 29 33  ALT 130* 74* 59* 21  ALKPHOS 411* 264* 227* 184*  BILITOT 3.9* 1.3* 1.1 0.7  PROT 6.6 5.2* 5.0* 5.5*  ALBUMIN 3.2* 2.5* 2.5* 3.0*     No results for input(s): "LIPASE", "AMYLASE" in the last 168 hours. No results for input(s): "AMMONIA" in the last 168 hours. Coagulation Profile: No results for input(s): "INR", "PROTIME" in the last 168 hours. Cardiac Enzymes: No results for input(s): "CKTOTAL", "CKMB", "CKMBINDEX", "TROPONINI" in the last 168 hours. BNP (last 3 results) No results for input(s): "PROBNP" in the last 8760 hours. HbA1C: No results for input(s): "HGBA1C" in the last 72 hours. CBG: No results for input(s): "GLUCAP" in the last 168 hours. Lipid Profile: No results for input(s): "CHOL", "HDL", "LDLCALC", "TRIG", "CHOLHDL", "LDLDIRECT" in the last 72 hours. Thyroid Function Tests: No results for input(s): "TSH", "T4TOTAL", "FREET4", "T3FREE", "THYROIDAB" in the last 72 hours. Anemia Panel: No results for input(s): "VITAMINB12", "FOLATE", "FERRITIN", "TIBC", "IRON", "RETICCTPCT" in the last 72 hours. Sepsis Labs: Recent Labs  Lab 12/07/22 0111  LATICACIDVEN 1.2     Recent Results (from the past 240 hour(s))  Blood culture (routine x 2)     Status: Abnormal   Collection Time: 12/07/22  1:11 AM   Specimen: BLOOD RIGHT ARM  Result Value Ref Range Status   Specimen Description BLOOD RIGHT ARM  Final   Special Requests   Final  BOTTLES DRAWN AEROBIC AND ANAEROBIC Blood Culture results may not be optimal due to an excessive volume of blood received in culture bottles   Culture  Setup Time   Final    ANAEROBIC BOTTLE ONLY GRAM NEGATIVE RODS CRITICAL RESULT CALLED TO, READ BACK BY AND VERIFIED WITH:  C/ PHARMD E. MARTIN 12/07/22 1518 A. LAFRANCE    Culture KLEBSIELLA PNEUMONIAE (A)  Final   Report Status 12/09/2022 FINAL  Final   Organism ID, Bacteria KLEBSIELLA PNEUMONIAE  Final      Susceptibility   Klebsiella pneumoniae - MIC*    AMPICILLIN RESISTANT Resistant     CEFAZOLIN <=4 SENSITIVE Sensitive     CEFEPIME <=0.12 SENSITIVE Sensitive     CEFTAZIDIME <=1 SENSITIVE Sensitive      CEFTRIAXONE <=0.25 SENSITIVE Sensitive     CIPROFLOXACIN <=0.25 SENSITIVE Sensitive     GENTAMICIN <=1 SENSITIVE Sensitive     IMIPENEM <=0.25 SENSITIVE Sensitive     TRIMETH/SULFA <=20 SENSITIVE Sensitive     AMPICILLIN/SULBACTAM 4 SENSITIVE Sensitive     PIP/TAZO <=4 SENSITIVE Sensitive     * KLEBSIELLA PNEUMONIAE  Urine Culture     Status: Abnormal   Collection Time: 12/07/22  1:11 AM   Specimen: Urine, Clean Catch  Result Value Ref Range Status   Specimen Description URINE, CLEAN CATCH  Final   Special Requests NONE  Final   Culture (A)  Final    <10,000 COLONIES/mL INSIGNIFICANT GROWTH Performed at [facility], [address] [place], [place] [address]    Report Status 12/08/2022 FINAL  Final  Resp panel by RT-PCR (RSV, Flu A&B, Covid) Anterior Nasal Swab     Status: None   Collection Time: 12/07/22  1:11 AM   Specimen: Anterior Nasal Swab  Result Value Ref Range Status   SARS Coronavirus 2 by RT PCR NEGATIVE NEGATIVE Final    Comment: (NOTE) SARS-CoV-2 target nucleic acids are NOT DETECTED.  The SARS-CoV-2 RNA is generally detectable in upper respiratory specimens during the acute phase of infection. The lowest concentration of SARS-CoV-2 viral copies this assay can detect is 138 copies/mL. A negative result does not preclude SARS-Cov-2 infection and should not be used as the sole basis for treatment or other patient management decisions. A negative result may occur with  improper specimen collection/handling, submission of specimen other than nasopharyngeal swab, presence of viral mutation(s) within the areas targeted by this assay, and inadequate number of viral copies(<138 copies/mL). A negative result must be combined with clinical observations, patient history, and epidemiological information. The expected result is Negative.  Fact Sheet for Patients:  EntrepreneurPulse.com.au  Fact Sheet for Healthcare Providers:   IncredibleEmployment.be  This test is no t yet approved or cleared by the [place] FDA and  has been authorized for detection and/or diagnosis of SARS-CoV-2 by FDA under an Emergency Use Authorization (EUA). This EUA will remain  in effect (meaning this test can be used) for the duration of the COVID-19 declaration under Section 564(b)(1) of the Act, 21 U.S.C.section 360bbb-3(b)(1), unless the authorization is terminated  or revoked sooner.       Influenza A by PCR NEGATIVE NEGATIVE Final   Influenza B by PCR NEGATIVE NEGATIVE Final    Comment: (NOTE) The Xpert Xpress SARS-CoV-2/FLU/RSV plus assay is intended as an aid in the diagnosis of influenza from Nasopharyngeal swab specimens and should not be used as a sole basis for treatment. Nasal washings and aspirates are unacceptable for Xpert Xpress SARS-CoV-2/FLU/RSV testing.  Fact Sheet for Patients: EntrepreneurPulse.com.au  Fact Sheet for Healthcare Providers: IncredibleEmployment.be  This test is not yet approved or cleared by the [place] FDA and has been authorized for detection and/or diagnosis of SARS-CoV-2 by FDA under an Emergency Use Authorization (EUA). This EUA will remain in effect (meaning this test can be used) for the duration of the COVID-19 declaration under Section 564(b)(1) of the Act, 21 U.S.C. section 360bbb-3(b)(1), unless the authorization is terminated or revoked.     Resp Syncytial Virus by PCR NEGATIVE NEGATIVE Final    Comment: (NOTE) Fact Sheet for Patients: EntrepreneurPulse.com.au  Fact Sheet for Healthcare Providers: IncredibleEmployment.be  This test is not yet approved or cleared by the [place] FDA and has been authorized for detection and/or diagnosis of SARS-CoV-2 by FDA under an Emergency Use Authorization (EUA). This EUA will remain in effect (meaning this test can be used) for  the duration of the COVID-19 declaration under Section 564(b)(1) of the Act, 21 U.S.C. section 360bbb-3(b)(1), unless the authorization is terminated or revoked.  Performed at [facility], [place] [address], [place],  [address]   Blood Culture ID Panel (Reflexed)     Status: Abnormal   Collection Time: 12/07/22  1:11 AM  Result Value Ref Range Status   Enterococcus faecalis NOT DETECTED NOT DETECTED Final   Enterococcus Faecium NOT DETECTED NOT DETECTED Final   Listeria monocytogenes NOT DETECTED NOT DETECTED Final   Staphylococcus species NOT DETECTED NOT DETECTED Final   Staphylococcus aureus (BCID) NOT DETECTED NOT DETECTED Final   Staphylococcus epidermidis NOT DETECTED NOT DETECTED Final   Staphylococcus lugdunensis NOT DETECTED NOT DETECTED Final   Streptococcus species NOT DETECTED NOT DETECTED Final   Streptococcus agalactiae NOT DETECTED NOT DETECTED Final   Streptococcus pneumoniae NOT DETECTED NOT DETECTED Final   Streptococcus pyogenes NOT DETECTED NOT DETECTED Final   A.calcoaceticus-baumannii NOT DETECTED NOT DETECTED Final   Bacteroides fragilis NOT DETECTED NOT DETECTED Final   Enterobacterales DETECTED (A) NOT DETECTED Final    Comment: Enterobacterales represent a large order of gram negative bacteria, not a single organism. CRITICAL RESULT CALLED TO, READ BACK BY AND VERIFIED WITH:  C/ PHARMD E. MARTIN 12/07/22 1518 A. LAFRANCE    Enterobacter cloacae complex NOT DETECTED NOT DETECTED Final   Escherichia coli NOT DETECTED NOT DETECTED Final   Klebsiella aerogenes NOT DETECTED NOT DETECTED Final   Klebsiella oxytoca NOT DETECTED NOT DETECTED Final   Klebsiella pneumoniae DETECTED (A) NOT DETECTED Final    Comment: CRITICAL RESULT CALLED TO, READ BACK BY AND VERIFIED WITH:  C/ PHARMD E. MARTIN 12/07/22 1518 A. LAFRANCE    Proteus species NOT DETECTED NOT DETECTED Final   Salmonella species NOT DETECTED NOT DETECTED Final   Serratia marcescens NOT  DETECTED NOT DETECTED Final   Haemophilus influenzae NOT DETECTED NOT DETECTED Final   Neisseria meningitidis NOT DETECTED NOT DETECTED Final   Pseudomonas aeruginosa NOT DETECTED NOT DETECTED Final   Stenotrophomonas maltophilia NOT DETECTED NOT DETECTED Final   Candida albicans NOT DETECTED NOT DETECTED Final   Candida auris NOT DETECTED NOT DETECTED Final   Candida glabrata NOT DETECTED NOT DETECTED Final   Candida krusei NOT DETECTED NOT DETECTED Final   Candida parapsilosis NOT DETECTED NOT DETECTED Final   Candida tropicalis NOT DETECTED NOT DETECTED Final   Cryptococcus neoformans/gattii NOT DETECTED NOT DETECTED Final   CTX-M ESBL NOT DETECTED NOT DETECTED Final   Carbapenem resistance IMP NOT DETECTED NOT DETECTED Final   Carbapenem  resistance KPC NOT DETECTED NOT DETECTED Final   Carbapenem resistance NDM NOT DETECTED NOT DETECTED Final   Carbapenem resist OXA 48 LIKE NOT DETECTED NOT DETECTED Final   Carbapenem resistance VIM NOT DETECTED NOT DETECTED Final    Comment: Performed at [facility], [place] [address] [place], [place] [address]  Blood culture (routine x 2)     Status: Abnormal   Collection Time: 12/07/22  3:07 AM   Specimen: BLOOD LEFT HAND  Result Value Ref Range Status   Specimen Description BLOOD LEFT HAND  Final   Special Requests   Final    BOTTLES DRAWN AEROBIC AND ANAEROBIC Blood Culture adequate volume   Culture  Setup Time   Final    GRAM NEGATIVE RODS IN BOTH AEROBIC AND ANAEROBIC BOTTLES CRITICAL RESULT CALLED TO, READ BACK BY AND VERIFIED WITH: PHARMD KAILEY MIKOLICHEK ON 1/74/94 @ 4967 BY DRT    Culture (A)  Final    KLEBSIELLA PNEUMONIAE SUSCEPTIBILITIES PERFORMED ON PREVIOUS CULTURE WITHIN THE LAST 5 DAYS. Performed at [facility], [place] [address], [place], [place] [address]    Report Status 12/09/2022 FINAL  Final         Radiology Studies: No results found.      Scheduled Meds:  aspirin  81 mg Oral Daily    escitalopram  20 mg Oral Daily   feeding supplement  237 mL Oral TID BM   metoprolol succinate  25 mg Oral BID   mouth rinse  15 mL Mouth Rinse 4 times per day   pantoprazole  40 mg Oral Daily   polyethylene glycol  17 g Oral BID   senna  1 tablet Oral Daily   tamsulosin  0.4 mg Oral Daily   Continuous Infusions:  sodium chloride Stopped (12/08/22 0624)    ceFAZolin (ANCEF) IV 2 g (12/12/22 1429)   metronidazole 500 mg (12/12/22 1056)     LOS: 5 days    Time spent: 35 minutes    Hlee Fringer A Omeed Osuna, MD [facility]   If 7PM-7AM, please contact night-coverage [email or web address]  12/12/2022, 3:03 PM

## 2022-12-12 NOTE — Progress Notes (Signed)
Call received from pt's dtr [facility] requesting pt be discharged to [facility] today. [employer] auth remains pending at this time. Pt's dtr updated.   Wandra Feinstein, MSW, LCSW [phone] (coverage)

## 2022-12-13 DIAGNOSIS — R627 Adult failure to thrive: Secondary | ICD-10-CM | POA: Diagnosis not present

## 2022-12-13 DIAGNOSIS — N390 Urinary tract infection, site not specified: Secondary | ICD-10-CM | POA: Diagnosis not present

## 2022-12-13 DIAGNOSIS — R17 Unspecified jaundice: Secondary | ICD-10-CM | POA: Diagnosis not present

## 2022-12-13 DIAGNOSIS — R7989 Other specified abnormal findings of blood chemistry: Secondary | ICD-10-CM | POA: Diagnosis not present

## 2022-12-13 LAB — BASIC METABOLIC PANEL
Anion gap: 9 (ref 5–15)
BUN: 11 mg/dL (ref 8–23)
CO2: 24 mmol/L (ref 22–32)
Calcium: 8.6 mg/dL — ABNORMAL LOW (ref 8.9–10.3)
Chloride: 102 mmol/L (ref 98–111)
Creatinine, Ser: 1.21 mg/dL (ref 0.61–1.24)
GFR, Estimated: >60 mL/min (ref >60)
Glucose, Bld: 111 mg/dL — ABNORMAL HIGH (ref 70–99)
Potassium: 4.3 mmol/L (ref 3.5–5.1)
Sodium: 135 mmol/L (ref 135–145)

## 2022-12-13 NOTE — Progress Notes (Signed)
Mobility Specialist Progress Note:   12/13/22 1030  Mobility  Activity Ambulated with assistance in hallway  Level of Assistance Standby assist, set-up cues, supervision of patient - no hands on  Assistive Device Front wheel walker  Distance Ambulated (ft) 500 ft  Activity Response Tolerated well  Mobility Referral Yes  $Mobility charge 1 Mobility   Pt received in chair and eager. No complaints. Pt returned to chair with all needs met, call bell in reach, and chair alarm on.   Andrey Campanile Mobility Specialist Please contact via SecureChat or  Rehab office at [phone]

## 2022-12-13 NOTE — Progress Notes (Signed)
PROGRESS NOTE    Francisco [name]  GQQ:[id number] DOB: [birth year] DOA: 12/07/2022 PCP: [name], FNP   Brief Narrative: 82 year old with past medical history significant for hypertension, hyperlipidemia, CKD stage IIIa, BPH who presented to [name] ED on 1/22 with complaints of generalized weakness, dysphagia with associated nausea vomiting.  Also reported rigors and urinary frequency and dysuria the day prior to admission.  He was admitted August 2023 with acute on chronic cholecystitis requiring laparoscopic cholecystectomy with drain placement complicated by bile leak postoperative required placement of a CBD stent via ERCP.  He presented with transaminases, bilirubin 3.9, abdominal ultrasound cholecystectomy without biliary dilation.  Patient was found to have Klebsiella pneumoniae bacteremia, sepsis and urinary tract infection.  Worsening transaminases and underwent ERCP on 1/23, biliary stent was removed, prior sphincterectomy, prior subtotal cholecystectomy, moderate amount of CBD sludge, biliary tree was swept and sludge was found and removed.   Assessment & Plan:   Principal Problem:   Failure to thrive in adult Active Problems:   Essential hypertension   BPH (benign prostatic hyperplasia)   GAD (generalized anxiety disorder)   UTI (urinary tract infection)   Elevated liver function tests   Acute kidney injury superimposed on chronic kidney disease (HCC)   Dysphagia   Dyslipidemia   Weight loss   Protein-calorie malnutrition, severe   Total bilirubin, elevated  1-Klebsiella pneumonia  bacteremia, sepsis POA Urinary tract infection: -Patient presented with weakness, urinary symptoms, UA positive nitrate, blood cultures positive for Klebsiella pneumoniae Antibiotics changed to Ancef per sensitivity He will need a total of 10 to 14 days treatment, he can be discharged on oral antibiotics Stable.   2-Transaminases, hyperbilirubinemia:  History of Subtotal  Cholecystectomy, status post ERCP with biliary stent placement for postoperative bile leak on 06/2022. -Presented with transaminases hyperbilirubinemia -Continue to hold statins -Underwent ERCP 1/23: Biliary stent was removed, prior sphincterectomy, prior subtotal cholecystectomy, moderate amount of CBD sludge, biliary tree was swept and sludge was found and removed. -Liver function test trending down -Appreciate  GI evaluation  Acute renal failure on CKD stage IIIa: Likely secondary to prerenal azotemia in the setting of dehydration versus ATN Creatinine peaked to 1.8--- 10 down to 1.2 Back on fluids cr increase to 1.4---1.2   Dysphagia/failure to thrive And reports difficulty eating solids and sometimes liquids. Underwent EGD 1/23 with no significant finding but dilation perform Barium esophagogram 1/24: Limited study,  Narrowed appearance of the distal esophagus. A swallowed 13 mm barium tablet did not pass beyond this point despite a prolonged period of observation. 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 Soft diet trial.  On PPI.  Oral pharyngeal dysphagia.  Dysphagia 3 diet. Monitor on current diet.  Report tolerating diet.   Severe protein malnutrition: Continue with Ensure   Nutrition Problem: Severe Malnutrition Etiology: chronic illness    Signs/Symptoms: energy intake < 75% for > or equal to 1 month, percent weight loss Percent weight loss: 19 % (over 5 months)    Interventions: Ensure Enlive (each supplement provides 350kcal and 20 grams of protein)  Estimated body mass index is 24.09 kg/m as calculated from the following:   Height as of this encounter: '5\' 9"'$  (1.753 m).   Weight as of this encounter: 74 kg.   DVT prophylaxis: SCDs Code Status: DNR Family Communication: Care discussed with patient Disposition Plan:  Status is: Inpatient Remains inpatient appropriate because: stable for  SNF    Consultants:  GI  Procedures:  ERCP  Antimicrobials:    Subjective: He denies pain.   Objective: Vitals:   12/12/22 1634 12/12/22 2105 12/13/22 0520 12/13/22 0909  BP: (!) 110/58 123/69 (!) 173/83 (!) 168/80  Pulse: (!) 58 67 60 66  Resp: '18 17 17 18  '$ Temp: 98 F (36.7 C) 97.9 F (36.6 C) (!) 97.4 F (36.3 C) 98 F (36.7 C)  TempSrc: Oral Oral Oral Oral  SpO2: 100% 99% 98% 99%  Weight:      Height:        Intake/Output Summary (Last 24 hours) at 12/13/2022 1532 Last data filed at 12/13/2022 1238 Gross per 24 hour  Intake 1512.87 ml  Output 0 ml  Net 1512.87 ml    Filed Weights   12/07/22 0058 12/07/22 1302 12/11/22 2028  Weight: 77.1 kg 74.7 kg 74 kg    Examination:  General exam:  NAD Respiratory system: CTA Cardiovascular system: S 1, S 2 RRR Gastrointestinal system: BS present, soft,  nt Central nervous system: Alert, follows command Extremities: No edema   Data Reviewed: I have personally reviewed following labs and imaging studies  CBC: Recent Labs  Lab 12/07/22 0111 12/08/22 0653 12/09/22 0407 12/12/22 0725  WBC 15.8* 7.4 7.8 6.6  NEUTROABS 14.8*  --   --   --   HGB 12.2* 10.5* 10.9* 12.1*  HCT 37.5* 32.3* 32.0* 34.5*  MCV 97.7 97.6 95.8 92.2  PLT 246 206 215 503    Basic Metabolic Panel: Recent Labs  Lab 12/07/22 0111 12/08/22 0653 12/09/22 0407 12/12/22 0725 12/13/22 1118  NA 136 135 135 135 135  K 4.0 4.0 4.2 3.9 4.3  CL 100 101 99 100 102  CO2 '24 25 28 25 24  '$ GLUCOSE 218* 127* 105* 154* 111*  BUN '20 12 11 11 11  '$ CREATININE 1.88* 1.22 1.22 1.45* 1.21  CALCIUM 8.9 8.4* 8.4* 8.5* 8.6*    GFR: Estimated Creatinine Clearance: 47.9 mL/min (by C-G formula based on SCr of 1.21 mg/dL). Liver Function Tests: Recent Labs  Lab 12/07/22 0111 12/08/22 0653 12/09/22 0407 12/12/22 0725  AST 90* 38 29 33  ALT 130* 74* 59* 21  ALKPHOS 411* 264* 227* 184*  BILITOT 3.9* 1.3* 1.1 0.7  PROT 6.6 5.2* 5.0* 5.5*  ALBUMIN  3.2* 2.5* 2.5* 3.0*    No results for input(s): "LIPASE", "AMYLASE" in the last 168 hours. No results for input(s): "AMMONIA" in the last 168 hours. Coagulation Profile: No results for input(s): "INR", "PROTIME" in the last 168 hours. Cardiac Enzymes: No results for input(s): "CKTOTAL", "CKMB", "CKMBINDEX", "TROPONINI" in the last 168 hours. BNP (last 3 results) No results for input(s): "PROBNP" in the last 8760 hours. HbA1C: No results for input(s): "HGBA1C" in the last 72 hours. CBG: No results for input(s): "GLUCAP" in the last 168 hours. Lipid Profile: No results for input(s): "CHOL", "HDL", "LDLCALC", "TRIG", "CHOLHDL", "LDLDIRECT" in the last 72 hours. Thyroid Function Tests: No results for input(s): "TSH", "T4TOTAL", "FREET4", "T3FREE", "THYROIDAB" in the last 72 hours. Anemia Panel: No results for input(s): "VITAMINB12", "FOLATE", "FERRITIN", "TIBC", "IRON", "RETICCTPCT" in the last 72 hours. Sepsis Labs: Recent Labs  Lab 12/07/22 0111  LATICACIDVEN 1.2     Recent Results (from the past 240 hour(s))  Blood culture (routine x 2)     Status: Abnormal   Collection Time: 12/07/22  1:11 AM   Specimen: BLOOD RIGHT ARM  Result Value Ref Range Status   Specimen Description BLOOD RIGHT ARM  Final   Special Requests  Final    BOTTLES DRAWN AEROBIC AND ANAEROBIC Blood Culture results may not be optimal due to an excessive volume of blood received in culture bottles   Culture  Setup Time   Final    ANAEROBIC BOTTLE ONLY GRAM NEGATIVE RODS CRITICAL RESULT CALLED TO, READ BACK BY AND VERIFIED WITH:  C/ PHARMD E. MARTIN 12/07/22 1518 A. LAFRANCE    Culture KLEBSIELLA PNEUMONIAE (A)  Final   Report Status 12/09/2022 FINAL  Final   Organism ID, Bacteria KLEBSIELLA PNEUMONIAE  Final      Susceptibility   Klebsiella pneumoniae - MIC*    AMPICILLIN RESISTANT Resistant     CEFAZOLIN <=4 SENSITIVE Sensitive     CEFEPIME <=0.12 SENSITIVE Sensitive     CEFTAZIDIME <=1 SENSITIVE  Sensitive     CEFTRIAXONE <=0.25 SENSITIVE Sensitive     CIPROFLOXACIN <=0.25 SENSITIVE Sensitive     GENTAMICIN <=1 SENSITIVE Sensitive     IMIPENEM <=0.25 SENSITIVE Sensitive     TRIMETH/SULFA <=20 SENSITIVE Sensitive     AMPICILLIN/SULBACTAM 4 SENSITIVE Sensitive     PIP/TAZO <=4 SENSITIVE Sensitive     * KLEBSIELLA PNEUMONIAE  Urine Culture     Status: Abnormal   Collection Time: 12/07/22  1:11 AM   Specimen: Urine, Clean Catch  Result Value Ref Range Status   Specimen Description URINE, CLEAN CATCH  Final   Special Requests NONE  Final   Culture (A)  Final    <10,000 COLONIES/mL INSIGNIFICANT GROWTH Performed at [facility], [address], [name], [place] [address]    Report Status 12/08/2022 FINAL  Final  Resp panel by RT-PCR (RSV, Flu A&B, Covid) Anterior Nasal Swab     Status: None   Collection Time: 12/07/22  1:11 AM   Specimen: Anterior Nasal Swab  Result Value Ref Range Status   SARS Coronavirus 2 by RT PCR NEGATIVE NEGATIVE Final    Comment: (NOTE) SARS-CoV-2 target nucleic acids are NOT DETECTED.  The SARS-CoV-2 RNA is generally detectable in upper respiratory specimens during the acute phase of infection. The lowest concentration of SARS-CoV-2 viral copies this assay can detect is 138 copies/mL. A negative result does not preclude SARS-Cov-2 infection and should not be used as the sole basis for treatment or other patient management decisions. A negative result may occur with  improper specimen collection/handling, submission of specimen other than nasopharyngeal swab, presence of viral mutation(s) within the areas targeted by this assay, and inadequate number of viral copies(<138 copies/mL). A negative result must be combined with clinical observations, patient history, and epidemiological information. The expected result is Negative.  Fact Sheet for Patients:  EntrepreneurPulse.com.au  Fact Sheet for Healthcare Providers:   IncredibleEmployment.be  This test is no t yet approved or cleared by the [place] FDA and  has been authorized for detection and/or diagnosis of SARS-CoV-2 by FDA under an Emergency Use Authorization (EUA). This EUA will remain  in effect (meaning this test can be used) for the duration of the COVID-19 declaration under Section 564(b)(1) of the Act, 21 U.S.C.section 360bbb-3(b)(1), unless the authorization is terminated  or revoked sooner.       Influenza A by PCR NEGATIVE NEGATIVE Final   Influenza B by PCR NEGATIVE NEGATIVE Final    Comment: (NOTE) The Xpert Xpress SARS-CoV-2/FLU/RSV plus assay is intended as an aid in the diagnosis of influenza from Nasopharyngeal swab specimens and should not be used as a sole basis for treatment. Nasal washings and aspirates are unacceptable for Xpert  Xpress SARS-CoV-2/FLU/RSV testing.  Fact Sheet for Patients: EntrepreneurPulse.com.au  Fact Sheet for Healthcare Providers: IncredibleEmployment.be  This test is not yet approved or cleared by the [place] FDA and has been authorized for detection and/or diagnosis of SARS-CoV-2 by FDA under an Emergency Use Authorization (EUA). This EUA will remain in effect (meaning this test can be used) for the duration of the COVID-19 declaration under Section 564(b)(1) of the Act, 21 U.S.C. section 360bbb-3(b)(1), unless the authorization is terminated or revoked.     Resp Syncytial Virus by PCR NEGATIVE NEGATIVE Final    Comment: (NOTE) Fact Sheet for Patients: EntrepreneurPulse.com.au  Fact Sheet for Healthcare Providers: IncredibleEmployment.be  This test is not yet approved or cleared by the [place] FDA and has been authorized for detection and/or diagnosis of SARS-CoV-2 by FDA under an Emergency Use Authorization (EUA). This EUA will remain in effect (meaning this test can be used) for  the duration of the COVID-19 declaration under Section 564(b)(1) of the Act, 21 U.S.C. section 360bbb-3(b)(1), unless the authorization is terminated or revoked.  Performed at [facility], [place] [address], [place], [place] [address]   Blood Culture ID Panel (Reflexed)     Status: Abnormal   Collection Time: 12/07/22  1:11 AM  Result Value Ref Range Status   Enterococcus faecalis NOT DETECTED NOT DETECTED Final   Enterococcus Faecium NOT DETECTED NOT DETECTED Final   Listeria monocytogenes NOT DETECTED NOT DETECTED Final   Staphylococcus species NOT DETECTED NOT DETECTED Final   Staphylococcus aureus (BCID) NOT DETECTED NOT DETECTED Final   Staphylococcus epidermidis NOT DETECTED NOT DETECTED Final   Staphylococcus lugdunensis NOT DETECTED NOT DETECTED Final   Streptococcus species NOT DETECTED NOT DETECTED Final   Streptococcus agalactiae NOT DETECTED NOT DETECTED Final   Streptococcus pneumoniae NOT DETECTED NOT DETECTED Final   Streptococcus pyogenes NOT DETECTED NOT DETECTED Final   A.calcoaceticus-baumannii NOT DETECTED NOT DETECTED Final   Bacteroides fragilis NOT DETECTED NOT DETECTED Final   Enterobacterales DETECTED (A) NOT DETECTED Final    Comment: Enterobacterales represent a large order of gram negative bacteria, not a single organism. CRITICAL RESULT CALLED TO, READ BACK BY AND VERIFIED WITH:  C/ PHARMD E. MARTIN 12/07/22 1518 A. LAFRANCE    Enterobacter cloacae complex NOT DETECTED NOT DETECTED Final   Escherichia coli NOT DETECTED NOT DETECTED Final   Klebsiella aerogenes NOT DETECTED NOT DETECTED Final   Klebsiella oxytoca NOT DETECTED NOT DETECTED Final   Klebsiella pneumoniae DETECTED (A) NOT DETECTED Final    Comment: CRITICAL RESULT CALLED TO, READ BACK BY AND VERIFIED WITH:  C/ PHARMD E. MARTIN 12/07/22 1518 A. LAFRANCE    Proteus species NOT DETECTED NOT DETECTED Final   Salmonella species NOT DETECTED NOT DETECTED Final   Serratia marcescens NOT  DETECTED NOT DETECTED Final   Haemophilus influenzae NOT DETECTED NOT DETECTED Final   Neisseria meningitidis NOT DETECTED NOT DETECTED Final   Pseudomonas aeruginosa NOT DETECTED NOT DETECTED Final   Stenotrophomonas maltophilia NOT DETECTED NOT DETECTED Final   Candida albicans NOT DETECTED NOT DETECTED Final   Candida auris NOT DETECTED NOT DETECTED Final   Candida glabrata NOT DETECTED NOT DETECTED Final   Candida krusei NOT DETECTED NOT DETECTED Final   Candida parapsilosis NOT DETECTED NOT DETECTED Final   Candida tropicalis NOT DETECTED NOT DETECTED Final   Cryptococcus neoformans/gattii NOT DETECTED NOT DETECTED Final   CTX-M ESBL NOT DETECTED NOT DETECTED Final   Carbapenem resistance IMP NOT DETECTED NOT DETECTED  Final   Carbapenem resistance KPC NOT DETECTED NOT DETECTED Final   Carbapenem resistance NDM NOT DETECTED NOT DETECTED Final   Carbapenem resist OXA 48 LIKE NOT DETECTED NOT DETECTED Final   Carbapenem resistance VIM NOT DETECTED NOT DETECTED Final    Comment: Performed at [facility], [place] [address], [place], [place] [address]  Blood culture (routine x 2)     Status: Abnormal   Collection Time: 12/07/22  3:07 AM   Specimen: BLOOD LEFT HAND  Result Value Ref Range Status   Specimen Description BLOOD LEFT HAND  Final   Special Requests   Final    BOTTLES DRAWN AEROBIC AND ANAEROBIC Blood Culture adequate volume   Culture  Setup Time   Final    GRAM NEGATIVE RODS IN BOTH AEROBIC AND ANAEROBIC BOTTLES CRITICAL RESULT CALLED TO, READ BACK BY AND VERIFIED WITH: PHARMD KAILEY MIKOLICHEK ON 6/31/49 @ 7026 BY DRT    Culture (A)  Final    KLEBSIELLA PNEUMONIAE SUSCEPTIBILITIES PERFORMED ON PREVIOUS CULTURE WITHIN THE LAST 5 DAYS. Performed at [facility], [place] [address], [place], [place] [address]    Report Status 12/09/2022 FINAL  Final         Radiology Studies: No results found.      Scheduled Meds:  aspirin  81 mg Oral Daily    escitalopram  20 mg Oral Daily   feeding supplement  237 mL Oral TID BM   metoprolol succinate  25 mg Oral BID   mouth rinse  15 mL Mouth Rinse 4 times per day   pantoprazole  40 mg Oral Daily   polyethylene glycol  17 g Oral BID   senna  1 tablet Oral Daily   tamsulosin  0.4 mg Oral Daily   Continuous Infusions:  sodium chloride Stopped (12/08/22 0624)    ceFAZolin (ANCEF) IV 2 g (12/13/22 1300)   lactated ringers 75 mL/hr at 12/12/22 1548   metronidazole 500 mg (12/13/22 1011)     LOS: 6 days    Time spent: 35 minutes    Maudy Yonan A [name], MD [facility]   If 7PM-7AM, please contact night-coverage [email or web address]  12/13/2022, 3:32 PM

## 2022-12-14 DIAGNOSIS — N189 Chronic kidney disease, unspecified: Secondary | ICD-10-CM | POA: Diagnosis not present

## 2022-12-14 DIAGNOSIS — Z89022 Acquired absence of left finger(s): Secondary | ICD-10-CM | POA: Diagnosis not present

## 2022-12-14 DIAGNOSIS — I129 Hypertensive chronic kidney disease with stage 1 through stage 4 chronic kidney disease, or unspecified chronic kidney disease: Secondary | ICD-10-CM | POA: Diagnosis not present

## 2022-12-14 DIAGNOSIS — A419 Sepsis, unspecified organism: Secondary | ICD-10-CM | POA: Diagnosis not present

## 2022-12-14 DIAGNOSIS — R1310 Dysphagia, unspecified: Secondary | ICD-10-CM | POA: Diagnosis not present

## 2022-12-14 DIAGNOSIS — R748 Abnormal levels of other serum enzymes: Secondary | ICD-10-CM | POA: Diagnosis not present

## 2022-12-14 DIAGNOSIS — Z79899 Other long term (current) drug therapy: Secondary | ICD-10-CM | POA: Diagnosis not present

## 2022-12-14 DIAGNOSIS — A4150 Gram-negative sepsis, unspecified: Secondary | ICD-10-CM | POA: Diagnosis not present

## 2022-12-14 DIAGNOSIS — E43 Unspecified severe protein-calorie malnutrition: Secondary | ICD-10-CM | POA: Diagnosis not present

## 2022-12-14 DIAGNOSIS — N390 Urinary tract infection, site not specified: Secondary | ICD-10-CM | POA: Diagnosis not present

## 2022-12-14 DIAGNOSIS — N179 Acute kidney failure, unspecified: Secondary | ICD-10-CM | POA: Diagnosis not present

## 2022-12-14 DIAGNOSIS — E785 Hyperlipidemia, unspecified: Secondary | ICD-10-CM | POA: Diagnosis not present

## 2022-12-14 DIAGNOSIS — R7881 Bacteremia: Secondary | ICD-10-CM | POA: Diagnosis not present

## 2022-12-14 DIAGNOSIS — N182 Chronic kidney disease, stage 2 (mild): Secondary | ICD-10-CM | POA: Diagnosis not present

## 2022-12-14 DIAGNOSIS — Z7189 Other specified counseling: Secondary | ICD-10-CM | POA: Diagnosis not present

## 2022-12-14 DIAGNOSIS — R627 Adult failure to thrive: Secondary | ICD-10-CM | POA: Diagnosis not present

## 2022-12-14 DIAGNOSIS — E559 Vitamin D deficiency, unspecified: Secondary | ICD-10-CM | POA: Diagnosis not present

## 2022-12-14 DIAGNOSIS — R1312 Dysphagia, oropharyngeal phase: Secondary | ICD-10-CM | POA: Diagnosis not present

## 2022-12-14 MED ORDER — ALPRAZOLAM 1 MG PO TABS: 1.0000 mg | ORAL_TABLET | Freq: Two times a day (BID) | ORAL | 0 refills | Status: DC | PRN

## 2022-12-14 MED ORDER — PANTOPRAZOLE SODIUM 40 MG PO TBEC: 40.0000 mg | DELAYED_RELEASE_TABLET | Freq: Every day | ORAL | 0 refills | Status: DC

## 2022-12-14 MED ORDER — CEFADROXIL 500 MG PO CAPS: 1000.0000 mg | ORAL_CAPSULE | Freq: Two times a day (BID) | ORAL | 0 refills | Status: AC

## 2022-12-14 MED ORDER — SENNA 8.6 MG PO TABS: 1.0000 | ORAL_TABLET | Freq: Every day | ORAL | 0 refills | Status: AC

## 2022-12-14 MED ORDER — POLYETHYLENE GLYCOL 3350 17 G PO PACK: 17.0000 g | PACK | Freq: Two times a day (BID) | ORAL | 0 refills | Status: AC

## 2022-12-14 NOTE — Care Management Important Message (Signed)
Important Message  Patient Details  Name: Francisco [name] MRN: [id number] Date of Birth: 1941/09/16   Medicare Important Message Given:  Yes     Shelda Altes 12/14/2022, 3:38 PM

## 2022-12-14 NOTE — Progress Notes (Signed)
Physical Therapy Treatment Patient Details Name: Francisco [name] MRN: [id number] DOB: Nov 22, 1940 Today's Date: 12/14/2022   History of Present Illness Patient is 82 y.o. male presenting with weakness and c/o difficulty swallowing as well as emesis and rigors... PMH significant of HTN, HLD, stage 3a CKD, and BPH. Recently he was last admitted from 10/3-4 with C4 cervical fracture s/p fusion. Pt reports decline since fusion and difficulty eating/swallowing. Prior to that, he was admitted in June for E coli bacteremia and from 8/20-29 with acute on chronic cholecystitis requiring a laparascopic subtotal fenestrating cholecystectomy with drain placement.    PT Comments    Making solid progress, anticipate quick recovery at SNF with some additional rehab to full regain independence. Further challenged with gait training no AD after initial bout with RW for support. Stability improving. Completed BERG with 37/56 (significant) fall risk. Patient will continue to benefit from skilled physical therapy services to further improve independence with functional mobility.    Recommendations for follow up therapy are one component of a multi-disciplinary discharge planning process, led by the attending physician.  Recommendations may be updated based on patient status, additional functional criteria and insurance authorization.  Follow Up Recommendations  Skilled nursing-short term rehab (<3 hours/day) Can patient physically be transported by private vehicle: Yes   Assistance Recommended at Discharge Intermittent Supervision/Assistance  Patient can return home with the following A little help with walking and/or transfers;A little help with bathing/dressing/bathroom;Assistance with cooking/housework;Help with stairs or ramp for entrance   Equipment Recommendations  None recommended by PT (pretty well-equipped)    Recommendations for Other Services       Precautions / Restrictions  Precautions Precautions: Fall Precaution Comments: Diet change DYS 3 Restrictions Weight Bearing Restrictions: No     Mobility  Bed Mobility Overal bed mobility: Needs Assistance Bed Mobility: Supine to Sit     Supine to sit: Supervision, HOB elevated Sit to supine: Supervision   General bed mobility comments: in recliner    Transfers Overall transfer level: Needs assistance Equipment used: Rolling walker (2 wheels) Transfers: Sit to/from Stand, Bed to chair/wheelchair/BSC Sit to Stand: Supervision   Step pivot transfers: Min guard, Min assist       General transfer comment: supervision for safety. performed with and without RW, cues for technique, progressed to rising without UE support.    Ambulation/Gait Ambulation/Gait assistance: Min guard Gait Distance (Feet): 150 Feet (x2) Assistive device: Rolling walker (2 wheels), None Gait Pattern/deviations: Step-through pattern, Decreased stride length, Drifts right/left Gait velocity: decr Gait velocity interpretation: <1.8 ft/sec, indicate of risk for recurrent falls   General Gait Details: Supervision for safety with use of RW for support, no overt LOB with this device. Cues for directions back to room. Close guard for safety without AD, demonstrating some drift and mild instability without overt LOB or buckling. Guarded gait with decreased stride.   Stairs             Wheelchair Mobility    Modified Rankin (Stroke Patients Only)       Balance Overall balance assessment: Needs assistance Sitting-balance support: Feet supported, Bilateral upper extremity supported Sitting balance-Leahy Scale: Good     Standing balance support: No upper extremity supported, During functional activity Standing balance-Leahy Scale: Fair                 High Level Balance Comments: Balance training with intermittent UE support on RW as needed. Min assist for a couple episodes of instability. Performed romberg  stance,  semi tandem, alteranting forward toe taps, eyes open eyes closed, trunk rotation. Standardized Balance Assessment Standardized Balance Assessment : Berg Balance Test Berg Balance Test Sit to Stand: Able to stand without using hands and stabilize independently Standing Unsupported: Able to stand safely 2 minutes Sitting with Back Unsupported but Feet Supported on Floor or Stool: Able to sit safely and securely 2 minutes Stand to Sit: Sits safely with minimal use of hands Transfers: Able to transfer safely, definite need of hands Standing Unsupported with Eyes Closed: Able to stand 10 seconds with supervision Standing Ubsupported with Feet Together: Able to place feet together independently and stand for 1 minute with supervision From Standing, Reach Forward with Outstretched Arm: Can reach forward >5 cm safely (2") From Standing Position, Pick up Object from Floor: Able to pick up shoe, needs supervision From Standing Position, Turn to Look Behind Over each Shoulder: Turn sideways only but maintains balance Turn 360 Degrees: Able to turn 360 degrees safely but slowly Standing Unsupported, Alternately Place Feet on Step/Stool: Able to complete >2 steps/needs minimal assist Standing Unsupported, One Foot in Front: Able to take small step independently and hold 30 seconds Standing on One Leg: Unable to try or needs assist to prevent fall Total Score: 37        Cognition Arousal/Alertness: Awake/alert Behavior During Therapy: WFL for tasks assessed/performed Overall Cognitive Status: Within Functional Limits for tasks assessed                                 General Comments: Needs cues for RW safety and management        Exercises      General Comments        Pertinent Vitals/Pain      Home Living                          Prior Function            PT Goals (current goals can now be found in the care plan section) Acute Rehab PT  Goals Patient Stated Goal: be able to eat easily and keep strength PT Goal Formulation: With patient Time For Goal Achievement: 12/21/22 Potential to Achieve Goals: Good Progress towards PT goals: Progressing toward goals    Frequency    Min 3X/week (keep 3x/wk; might decline SNF)      PT Plan Current plan remains appropriate    Co-evaluation              AM-PAC PT "6 Clicks" Mobility   Outcome Measure  Help needed turning from your back to your side while in a flat bed without using bedrails?: None Help needed moving from lying on your back to sitting on the side of a flat bed without using bedrails?: None Help needed moving to and from a bed to a chair (including a wheelchair)?: A Little Help needed standing up from a chair using your arms (e.g., wheelchair or bedside chair)?: A Little Help needed to walk in hospital room?: A Little Help needed climbing 3-5 steps with a railing? : A Little 6 Click Score: 20    End of Session Equipment Utilized During Treatment: Gait belt Activity Tolerance: Patient tolerated treatment well Patient left: in chair;with call bell/phone within reach;with chair alarm set Nurse Communication: Mobility status PT Visit Diagnosis: Difficulty in walking, not elsewhere classified (R26.2);Unsteadiness on feet (R26.81)  Time: 6015-6153 PT Time Calculation (min) (ACUTE ONLY): 15 min  Charges:  $Gait Training: 8-22 mins                     Candie Mile, PT, DPT Physical Therapist Acute Rehabilitation Services [facility]    Ellouise Newer 12/14/2022, 11:02 AM

## 2022-12-14 NOTE — TOC Progression Note (Signed)
Transition of Care [facility]) - Initial/Assessment Note    Patient Details  Name: Francisco [name] MRN: [id number] Date of Birth: 03/28/1941  Transition of Care [facility]) CM/SW Contact:    Milinda Antis, LCSWA Phone Number: 12/14/2022, 8:38 AM  Clinical Narrative:                 LCSW contacted Melissa in admissions at [facility] to inform that insurance authorization has been approved.  LCSW is awaiting a response.  Authorization details: Annett Gula [id number]             Approved from 1/28-1/31 2024  TOC following.  Expected Discharge Plan: [place]     Patient Goals and CMS Choice Patient states their goals for this hospitalization and ongoing recovery are:: to go to rehab   Choice offered to / list presented to : Patient      Expected Discharge Plan and Services In-house Referral: Clinical Social Work     Living arrangements for the past 2 months: Single Family Home                                      Prior Living Arrangements/Services Living arrangements for the past 2 months: Single Family Home Lives with:: Self Patient language and need for interpreter reviewed:: Yes Do you feel safe going back to the place where you live?: Yes      Need for Family Participation in Patient Care: Yes (Comment) Care giver support system in place?: Yes (comment)   Criminal Activity/Legal Involvement Pertinent to Current Situation/Hospitalization: No - Comment as needed  Activities of Daily Living Home Assistive Devices/Equipment: Cane (specify quad or straight), Walker (specify type) ADL Screening (condition at time of admission) Patient's cognitive ability adequate to safely complete daily activities?: Yes Is the patient deaf or have difficulty hearing?: No Does the patient have difficulty seeing, even when wearing glasses/contacts?: No Does the patient have difficulty concentrating, remembering, or making decisions?: No Patient able to express need for  assistance with ADLs?: Yes Does the patient have difficulty dressing or bathing?: No Independently performs ADLs?: Yes (appropriate for developmental age) Communication: Independent Dressing (OT): Needs assistance Is this a change from baseline?: Change from baseline, expected to last >3 days Grooming: Independent Feeding: Independent Bathing: Needs assistance Is this a change from baseline?: Change from baseline, expected to last >3 days Toileting: Needs assistance Is this a change from baseline?: Change from baseline, expected to last >3days In/Out Bed: Needs assistance Is this a change from baseline?: Change from baseline, expected to last >3 days Walks in Home: Independent Does the patient have difficulty walking or climbing stairs?: Yes Weakness of Legs: Both Weakness of Arms/Hands: Both  Permission Sought/Granted   Permission granted to share information with : Yes, Verbal Permission Granted     Permission granted to share info w AGENCY: SNF        Emotional Assessment Appearance:: Appears stated age Attitude/Demeanor/Rapport: Engaged Affect (typically observed): Appropriate Orientation: : Oriented to Situation, Oriented to  Time, Oriented to Place, Oriented to Self Alcohol / Substance Use: Not Applicable Psych Involvement: No (comment)  Admission diagnosis:  UTI (urinary tract infection) [N39.0] Elevated liver function tests [R79.89] Elevated LFTs [R79.89] Acute kidney injury (nontraumatic) (HCC) [N17.9] Total bilirubin, elevated [R17] Normochromic normocytic anemia [D64.9] Elevated random blood glucose level [R73.09] Urinary tract infection without hematuria, site unspecified [N39.0] Patient Active Problem List  Diagnosis Date Noted   Protein-calorie malnutrition, severe 12/09/2022   Total bilirubin, elevated 12/09/2022   Weight loss 12/08/2022   UTI (urinary tract infection) 12/07/2022   Elevated liver function tests 12/07/2022   Failure to thrive in adult  12/07/2022   Acute kidney injury superimposed on chronic kidney disease ([place]) 12/07/2022   Dysphagia 12/07/2022   Dyslipidemia 12/07/2022   C4 cervical fracture ([place]) 08/18/2022   Bile leak, postoperative    Constipation    Normocytic anemia 07/06/2022   RUQ abdominal pain 07/05/2022   Acute calculous cholecystitis 07/05/2022   Lactic acidosis 05/06/2022   Thrombocytopenia ([place]) 05/06/2022   Chronic kidney disease, 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[place]) 05/06/2022   Acute cholecystitis with chronic cholecystitis 03/17/2022   Peripheral vascular disease, 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   Chest pain of uncertain etiology 16/11/930   BMI 31.0-31.9,adult 09/06/2015   BPH (benign prostatic hyperplasia) 08/06/2015   Asthma, chronic 04/04/2015   Essential hypertension 04/12/2013   GAD (generalized anxiety disorder) 04/12/2013   GERD without esophagitis 04/12/2013   Mixed hyperlipidemia 04/12/2013   PCP:  [name], FNP Pharmacy:   [facility] ([place], [place] - [address] [facility] [address] [place] [address] [place] [address] Phone: [phone] Fax: [phone]  [place], [place] [address] Phone: [phone] Fax: [place] [address], [place] [address] [address] [place] [address] Phone: [phone] Fax: [phone]     Social Determinants of Health (SDOH) Social History: SDOH Screenings   Food Insecurity: No Food Insecurity (12/09/2022)  Housing: Low Risk  (12/09/2022)  Transportation Needs: No Transportation Needs (12/09/2022)  Utilities: Not At Risk (12/09/2022)  Alcohol Screen: Low Risk  (08/08/2021)  Depression (PHQ2-9): Low Risk  (08/14/2022)  Financial Resource Strain: Low Risk  (08/20/2022)  Physical Activity: Inactive (08/08/2021)  Social Connections: Socially Isolated (08/08/2021)   Stress: No Stress Concern Present (08/08/2021)  Tobacco Use: Medium Risk (12/08/2022)   SDOH Interventions:     Readmission Risk Interventions     No data to display

## 2022-12-14 NOTE — Progress Notes (Signed)
Mobility Specialist Progress Note:    12/14/22 1200  Mobility  Activity Ambulated with assistance in hallway  Level of Assistance Contact guard assist, steadying assist  Assistive Device None  Distance Ambulated (ft) 250 ft  Activity Response Tolerated well  Mobility Referral Yes  $Mobility charge 1 Mobility   Pt was agreeable to mobility session. No AD required, used CGA for safety. Tolerated well, asx throughout. Returned pt to bed with all needs met.   Royetta Crochet Mobility Specialist Please contact via Solicitor or  Rehab office at [phone]

## 2022-12-14 NOTE — Progress Notes (Signed)
Patient discharged per physician order. Report called to [place] staff. This nurse gave report to [place]. Patient transportation downstairs, patient taken to transport via wheelchair by Engineer, manufacturing. Patient in stable condition, all patient belongings taken with patient.

## 2022-12-14 NOTE — TOC Transition Note (Addendum)
Transition of Care [facility]) - CM/SW Discharge Note   Patient Details  Name: Francisco [name] MRN: [id number] Date of Birth: 02-Jun-1941  Transition of Care [facility]) CM/SW Contact:  Milinda Antis, [place] Phone Number: 12/14/2022, 11:06 AM   Clinical Narrative:    [facility] Anticipated DC date: 12/14/2022 Family notified: Yes, secure VM left for daughter Freight forwarder by: [facility]   Per MD patient ready for DC to SNF. RN to call report prior to discharge [phone] roo [phone]). RN, patient, patient's family, and facility notified of DC. Discharge Summary  sent to facility. DC packet on chart. Safe transport has been requested for patient.   CSW will sign off for now as social work intervention is no longer needed. Please consult [place] again if new needs arise.    Final next level of care: Skilled Nursing Facility Barriers to Discharge: Barriers Resolved   Patient Goals and CMS Choice CMS Medicare.gov Compare Post Acute Care list provided to:: Patient Choice offered to / list presented to : Adult Children, Patient  Discharge Placement                Patient chooses bed at:  [facility]) Patient to be transferred to facility by: [place] Name of family member notified: [name] (Daughter) [phone] Patient and family notified of of transfer: 12/14/22  Discharge Plan and Services Additional resources added to the After Visit Summary for   In-house Referral: Clinical Social Work                                   Social Determinants of Health (SDOH) Interventions SDOH Screenings   Food Insecurity: No Food Insecurity (12/09/2022)  Housing: Low Risk  (12/09/2022)  Transportation Needs: No Transportation Needs (12/09/2022)  Utilities: Not At Risk (12/09/2022)  Alcohol Screen: Low Risk  (08/08/2021)  Depression (PHQ2-9): Low Risk  (08/14/2022)  Financial Resource Strain: Low Risk  (08/20/2022)  Physical Activity: Inactive (08/08/2021)   Social Connections: Socially Isolated (08/08/2021)  Stress: No Stress Concern Present (08/08/2021)  Tobacco Use: Medium Risk (12/08/2022)     Readmission Risk Interventions     No data to display

## 2022-12-14 NOTE — Discharge Summary (Addendum)
Physician Discharge Summary   Patient: Francisco [name] MRN: [id number] DOB: [birth year]  Admit date:     12/07/2022  Discharge date: 12/14/22  Discharge Physician: Elmarie Shiley   PCP: [name], FNP   Recommendations at discharge:   Needs LFT in 1 week.  Monitor volume status and kidney function. Lasix ion hold at discharge due to AKI.   Discharge Diagnoses: Principal Problem:   Failure to thrive in adult Active Problems:   Essential hypertension   BPH (benign prostatic hyperplasia)   GAD (generalized anxiety disorder)   UTI (urinary tract infection)   Elevated liver function tests   Acute kidney injury superimposed on chronic kidney disease (HCC)   Dysphagia   Dyslipidemia   Weight loss   Protein-calorie malnutrition, severe   Total bilirubin, elevated  Resolved Problems:   * No resolved hospital problems. [facility] Course: 82 year old with past medical history significant for hypertension, hyperlipidemia, CKD stage IIIa, BPH who presented to [name] ED on 1/22 with complaints of generalized weakness, dysphagia with associated nausea vomiting.  Also reported rigors and urinary frequency and dysuria the day prior to admission.   He was admitted August 2023 with acute on chronic cholecystitis requiring laparoscopic cholecystectomy with drain placement complicated by bile leak postoperative required placement of a CBD stent via ERCP.   He presented with transaminases, bilirubin 3.9, abdominal ultrasound cholecystectomy without biliary dilation.  Patient was found to have Klebsiella pneumoniae bacteremia, sepsis and urinary tract infection.  Worsening transaminases and underwent ERCP on 1/23, biliary stent was removed, prior sphincterectomy, prior subtotal cholecystectomy, moderate amount of CBD sludge, biliary tree was swept and sludge was found and removed.    Assessment and Plan: 1-Klebsiella pneumonia  bacteremia, sepsis POA Urinary tract  infection: Severe Sepsis ruled in  -Patient presented with weakness, urinary symptoms, UA positive nitrate, blood cultures positive for Klebsiella pneumoniae Antibiotics changed to Ancef per sensitivity He will need a total of 10 days treatment, he can be discharged on oral antibiotics, Plan to discharge on Cefadroxil 1 gr BID to complete 10 days. He received  7 days IV Ancef.  Stable.    2-Transaminases, hyperbilirubinemia:  History of Subtotal Cholecystectomy, status post ERCP with biliary stent placement for postoperative bile leak on 06/2022. -Presented with transaminases hyperbilirubinemia -Continue to hold statins -Underwent ERCP 1/23: Biliary stent was removed, prior sphincterectomy, prior subtotal cholecystectomy, moderate amount of CBD sludge, biliary tree was swept and sludge was found and removed. -Liver function test trending down -Appreciate  GI evaluation   Acute renal failure on CKD stage IIIa: Likely secondary to prerenal azotemia in the setting of dehydration versus ATN Creatinine peaked to 1.8--- 10 down to 1.2 Back on fluids cr increase to 1.4---1.2  Holding lasix at discharge .   Dysphagia/failure to thrive And reports difficulty eating solids and sometimes liquids. Underwent EGD 1/23 with no significant finding but dilation perform Barium esophagogram 1/24: Limited study,  Narrowed appearance of the distal esophagus. A swallowed 13 mm barium tablet did not pass beyond this point despite a prolonged period of observation. 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 Soft diet trial.  On PPI.  Oral pharyngeal dysphagia.  Dysphagia 3 diet. Monitor on current diet.  Report tolerating diet.    Severe protein malnutrition: Continue with Ensure     Nutrition Problem: Severe Malnutrition Etiology: chronic illness          Consultants: GI Procedures performed: ERCP Disposition: Skilled  nursing  facility Diet recommendation:  Discharge Diet Orders (From admission, onward)     Start     Ordered   12/14/22 0000  Diet - low sodium heart healthy        12/14/22 1000           Cardiac diet DISCHARGE MEDICATION: Allergies as of 12/14/2022       Reactions   Crestor [rosuvastatin] Other (See Comments)   Unknown per Pt   Lipitor [atorvastatin] Other (See Comments)   Unknown per Pt   Penicillins    REACTION: swelling/hives Has patient had a PCN reaction causing immediate rash, facial/tongue/throat swelling, SOB or lightheadedness with hypotension:yes Has patient had a PCN reaction causing severe rash involving mucus membranes or skin necrosis: Yes Has patient had a PCN reaction that required hospitalization No Has patient had a PCN reaction occurring within the last 10 years: No If all of the above answers are "NO", then may proceed with Cephalosporin use.   Symbicort [budesonide-formoterol Fumarate] Other (See Comments)   Pain Behind ribs Unknown per Pt        Medication List     STOP taking these medications    acetaminophen 325 MG tablet Commonly known as: TYLENOL   furosemide 20 MG tablet Commonly known as: LASIX   meclizine 25 MG tablet Commonly known as: ANTIVERT   simvastatin 80 MG tablet Commonly known as: Zocor       TAKE these medications    ALPRAZolam 1 MG tablet Commonly known as: XANAX Take 1 tablet (1 mg total) by mouth 2 (two) times daily as needed for up to 5 days for sleep or anxiety. What changed: See the new instructions.   aspirin 81 MG tablet Take 81 mg by mouth daily.   cefadroxil 500 MG capsule Commonly known as: DURICEF Take 2 capsules (1,000 mg total) by mouth 2 (two) times daily for 3 days.   escitalopram 10 MG tablet Commonly known as: LEXAPRO Take 2 tablets (20 mg total) by mouth daily.   metoprolol succinate 25 MG 24 hr tablet Commonly known as: TOPROL-XL TAKE 1 TABLET BY MOUTH TWICE  DAILY   pantoprazole 40 MG  tablet Commonly known as: PROTONIX Take 1 tablet (40 mg total) by mouth daily. What changed:  medication strength how much to take   polyethylene glycol 17 g packet Commonly known as: MIRALAX / GLYCOLAX Take 17 g by mouth 2 (two) times daily. What changed:  when to take this reasons to take this   senna 8.6 MG Tabs tablet Commonly known as: SENOKOT Take 1 tablet (8.6 mg total) by mouth daily.   tamsulosin 0.4 MG Caps capsule Commonly known as: FLOMAX Take 1 capsule (0.4 mg total) by mouth daily.        Discharge Exam: Filed Weights   12/07/22 0058 12/07/22 1302 12/11/22 2028  Weight: 77.1 kg 74.7 kg 74 kg   General; NAD  Condition at discharge: stable  The results of significant diagnostics from this hospitalization (including imaging, microbiology, ancillary and laboratory) are listed below for reference.   Imaging Studies: DG Swallowing Func-Speech Pathology  Result Date: 12/10/2022 Table formatting from the original result was not included. Objective Swallowing Evaluation: Type of Study: MBS-Modified Barium Swallow Study  Patient Details Name: CLARKE PERETZ MRN: [id number] Date of Birth: [birth year] Today's Date: 12/10/2022 Time: SLP Start Time (ACUTE ONLY): 0945 -SLP Stop Time (ACUTE ONLY): 1010 SLP Time Calculation (min) (ACUTE ONLY): 25 min Past Medical History: Past Medical History:  Diagnosis Date  Anxiety   GERD (gastroesophageal reflux disease)   Hypertension   Vertigo  Past Surgical History: Past Surgical History: Procedure Laterality Date  AMPUTATION Left 08/05/2016  Procedure: REVISION AMPUTATION LEFT INDEX FINGER REPAIR LACERATION LEFT THUMB;  Surgeon: [name], MD;  Location: [place];  Service: Orthopedics;  Laterality: Left;  BILIARY STENT PLACEMENT N/A 07/09/2022  Procedure: BILIARY STENT PLACEMENT;  Surgeon: Ladene Artist, MD;  Location: [facility];  Service: Gastroenterology;  Laterality: N/A;  CHOLECYSTECTOMY N/A 07/05/2022  Procedure: LAPAROSCOPIC  CHOLECYSTECTOMY;  Surgeon: Dwan Bolt, MD;  Location: [facility];  Service: General;  Laterality: N/A;  ENDOSCOPIC RETROGRADE CHOLANGIOPANCREATOGRAPHY (ERCP) WITH PROPOFOL N/A 12/08/2022  Procedure: ENDOSCOPIC RETROGRADE CHOLANGIOPANCREATOGRAPHY (ERCP) WITH PROPOFOL;  Surgeon: Ladene Artist, MD;  Location: [place];  Service: Gastroenterology;  Laterality: N/A;  ERCP N/A 07/09/2022  Procedure: ENDOSCOPIC RETROGRADE CHOLANGIOPANCREATOGRAPHY (ERCP);  Surgeon: Ladene Artist, MD;  Location: [facility];  Service: Gastroenterology;  Laterality: N/A;  ESOPHAGOGASTRODUODENOSCOPY (EGD) WITH PROPOFOL N/A 12/08/2022  Procedure: ESOPHAGOGASTRODUODENOSCOPY (EGD) WITH PROPOFOL;  Surgeon: Mauri Pole, MD;  Location: [place];  Service: Gastroenterology;  Laterality: N/A;  POSTERIOR CERVICAL FUSION/FORAMINOTOMY N/A 08/18/2022  Procedure: C4-5 Posterior cervical fusion with lateral mass fixation;  Surgeon: Ashok Pall, MD;  Location: [place];  Service: Neurosurgery;  Laterality: N/A;  REMOVAL OF STONES  12/08/2022  Procedure: REMOVAL OF STONES;  Surgeon: Ladene Artist, MD;  Location: [place];  Service: Gastroenterology;;  [name]  07/09/2022  Procedure: [id number];  Surgeon: Ladene Artist, MD;  Location: [facility];  Service: Gastroenterology;;  [place]  12/08/2022  Procedure: STENT REMOVAL;  Surgeon: Ladene Artist, MD;  Location: [facility];  Service: Gastroenterology;; HPI: [name] who presented with weakness and reported symptoms of dysphagia with emesis and c/o water not going past "halfway down". CXR negative for active disease. GI consulted who recommended esophagram, allowance of clear liquids (full liquid diet ordered), likely EGD on 1/23, and possible SLP swallow eval which was subsequently ordered by hospitalist. EGD: 3 cm hiatal hernia, no endoscopic findings to suggest dysphagia. MBS and esophagram recommended. Esophagram 1/24: Narrowed  appearance of the distal esophagus. A swallowed 13 mm barium tablet did not pass beyond this point despite a prolonged period of observation. 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 PMH: HTN, HLD, stage 3a CKD, C4 cervical fracture s/p fusion (October, 2023), BPH, mild, nonobstructing, distal esophageal stricture noted on ERCP 07/09/22.  No data recorded  Recommendations for follow up therapy are one component of a multi-disciplinary discharge planning process, led by the attending physician.  Recommendations may be updated based on patient status, additional functional criteria and insurance authorization. Assessment / Plan / Recommendation   12/10/2022  10:31 AM Clinical Impressions Clinical Impression Pt presents with pharyngeal dysphagia characterized by a pharyngeal delay, and reduction in tongue base retraction, pharyngeal stripping, and anterior laryngeal movement. He demonstrated incomplete epiglottic inversion, base of tongue residue, vallecular residue, and posterior pharyngeal wall residue, Secondary swallows were ineffective in siginificantly reducing vallecular residue, but a liquid wash reduced it to a more functional level. He demonstrated penetration (PAS 3, 5) and aspiration (PAS 7,8) with consecutive swallows of thin liquids and with the initial bolus via spoon. Transient penetration (PAS 2; WNL) was observed with consecutive swallows of nectar thick liquids. Trace aspiration inconsistently triggered throat clearing and moderate aspiration resulted in coughing which was moderately effective  in expelling aspirated material. Laryngeal invasion was eliminated with use of individual boluses of thin liquids. Visualization of the esophagus revealed stasis of barium in the lower thoracic esophagus and transport of the 68m barium tablet was delayed in this area; esophageal work-up already completed. A dysphagia 3 diet with thin liquids is  recommended with observance of swallowing precautions. SLP Visit Diagnosis Dysphagia, pharyngeal phase (R13.13) Impact on safety and function Mild aspiration risk     12/10/2022  10:31 AM Treatment Recommendations Treatment Recommendations Therapy as outlined in treatment plan below     12/10/2022  10:31 AM Prognosis Prognosis for Safe Diet Advancement Good   12/10/2022  10:31 AM Diet Recommendations SLP Diet Recommendations Dysphagia 3 (Mech soft) solids;Thin liquid Liquid Administration via Cup;Straw Medication Administration Whole meds with liquid Compensations Slow rate;Follow solids with liquid;Small sips/bites Postural Changes Seated upright at 90 degrees     12/10/2022  10:31 AM Other Recommendations Oral Care Recommendations Oral care BID Follow Up Recommendations Skilled nursing-short term rehab (<3 hours/day) Functional Status Assessment Patient has had a recent decline in their functional status and demonstrates the ability to make significant improvements in function in a reasonable and predictable amount of time.   12/10/2022  10:31 AM Frequency and Duration  Speech Therapy Frequency (ACUTE ONLY) min 2x/week Treatment Duration 2 weeks     12/10/2022  10:31 AM Oral Phase Oral Phase [facility]   12/10/2022  10:31 AM Pharyngeal Phase Pharyngeal Phase Impaired Pharyngeal- Nectar Cup Reduced anterior laryngeal mobility;Reduced epiglottic inversion;Reduced pharyngeal peristalsis;Delayed swallow initiation-vallecula;Delayed swallow initiation-pyriform sinuses;Penetration/Aspiration during swallow;Pharyngeal residue - valleculae;Pharyngeal residue - posterior pharnyx;Pharyngeal residue - pyriform Pharyngeal Material enters airway, remains ABOVE vocal cords then ejected out Pharyngeal- Thin Teaspoon Reduced anterior laryngeal mobility;Reduced epiglottic inversion;Reduced pharyngeal peristalsis;Delayed swallow initiation-vallecula;Delayed swallow initiation-pyriform sinuses;Pharyngeal residue - valleculae;Pharyngeal residue -  posterior pharnyx;Pharyngeal residue - pyriform;Penetration/Aspiration before swallow Pharyngeal Material enters airway, passes BELOW cords without attempt by patient to eject out (silent aspiration);Material enters airway, passes BELOW cords and not ejected out despite cough attempt by patient Pharyngeal- Thin Cup Reduced anterior laryngeal mobility;Reduced epiglottic inversion;Reduced pharyngeal peristalsis;Delayed swallow initiation-vallecula;Delayed swallow initiation-pyriform sinuses;Penetration/Aspiration during swallow;Pharyngeal residue - valleculae;Pharyngeal residue - posterior pharnyx;Pharyngeal residue - pyriform;Penetration/Aspiration before swallow Pharyngeal Material enters airway, passes BELOW cords and not ejected out despite cough attempt by patient;Material enters airway, remains ABOVE vocal cords and not ejected out;Material enters airway, CONTACTS cords and not ejected out Pharyngeal- Thin Straw Reduced anterior laryngeal mobility;Reduced epiglottic inversion;Reduced pharyngeal peristalsis;Delayed swallow initiation-vallecula;Delayed swallow initiation-pyriform sinuses;Penetration/Aspiration during swallow;Pharyngeal residue - valleculae;Pharyngeal residue - posterior pharnyx;Pharyngeal residue - pyriform Pharyngeal Material enters airway, remains ABOVE vocal cords and not ejected out Pharyngeal- Puree Reduced anterior laryngeal mobility;Reduced epiglottic inversion;Reduced pharyngeal peristalsis;Delayed swallow initiation-vallecula;Delayed swallow initiation-pyriform sinuses;Pharyngeal residue - valleculae;Pharyngeal residue - posterior pharnyx;Pharyngeal residue - pyriform Pharyngeal- Regular Reduced anterior laryngeal mobility;Reduced epiglottic inversion;Reduced pharyngeal peristalsis;Delayed swallow initiation-vallecula;Delayed swallow initiation-pyriform sinuses;Pharyngeal residue - valleculae;Pharyngeal residue - posterior pharnyx;Pharyngeal residue - pyriform Pharyngeal- Pill Reduced  anterior laryngeal mobility;Reduced epiglottic inversion;Reduced pharyngeal peristalsis;Delayed swallow initiation-vallecula;Delayed swallow initiation-pyriform sinuses;Pharyngeal residue - valleculae;Pharyngeal residue - posterior pharnyx;Pharyngeal residue - pyriform    12/10/2022  10:31 AM Cervical Esophageal Phase  Cervical Esophageal Phase WBayshore Medical CenterShanika I. PHardin Negus MFredonia CNorth FreedomOffice number [phone] Marshall1/25/2024, 11:05 AM                     DG ESOPHAGUS W SINGLE CM (SOL OR THIN BA)  Result Date: 12/09/2022 CLINICAL DATA:  Provided history:  Dysphagia. Additional history provided: Recent endoscopy and esophageal dilation. EXAM: ESOPHAGUS/BARIUM SWALLOW/TABLET STUDY TECHNIQUE: A single contrast examination was performed using Omnipaque 300. Additionally, the patient swallowed a 13 mm barium tablet under fluoroscopy. The procedure was performed by Reatha Armour, PA-C, and was supervised and interpreted by Dr. Kellie Simmering. FLUOROSCOPY: Fluoroscopy time: 1 minute, 36 seconds (8.5 mGy). COMPARISON:  Chest CT 05/05/2022. FINDINGS: Examination somewhat limited by the patient's limited ability to reposition on the fluoroscopy table. Narrowed appearance of the distal esophagus. A swallowed 13 mm barium tablet did not pass beyond this point despite a prolonged period of observation. Prominent esophageal dysmotility with tertiary contractions. Small hiatal hernia. Small-volume gastroesophageal reflux observed to the level of the lower esophagus. IMPRESSION: 1. Examination somewhat limited by the patient's limited ability to reposition on the fluoroscopy table. 2. Narrowed appearance of the distal esophagus. A swallowed 13 mm barium tablet did not pass beyond this point despite a prolonged period of observation. 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 Correlate with findings on reported recent endoscopy. 3. Prominent esophageal dysmotility. 4.  Small hiatal hernia. 5. Small-volume gastroesophageal reflux observed to the level of the lower esophagus Electronically Signed   By: Kellie Simmering D.O.   On: 12/09/2022 11:31   DG ERCP  Result Date: 12/08/2022 CLINICAL DATA:  ERCP with stent and stone removal. History of prior cholecystectomy with bile leak. EXAM: ERCP TECHNIQUE: Multiple spot images obtained with the fluoroscopic device and submitted for interpretation post-procedure. FLUOROSCOPY TIME: FLUOROSCOPY TIME 2 minutes, 58 seconds (33.5 mGy) COMPARISON:  07/09/2022; CT abdomen pelvis-07/12/2022 Nuclear medicine HIDA scan-07/07/2022 FINDINGS: Thirteen spot intraoperative fluoroscopic images of the right upper abdominal quadrant during ERCP are provided for review. Initial image demonstrates ERCP probe overlying the right upper abdominal quadrant. An internal plastic biliary stent overlies the expected location of the distal aspect of the CBD. Cholecystectomy clips overlies expected location of the gallbladder fossa. Subsequent image demonstrates removal of the internal biliary stent with subsequent cannulation and opacification of the common bile duct which appears moderately dilated. Subsequent images demonstrate insufflation of a balloon within the central aspect of the CBD with subsequent sheath cholangiogram, presumed biliary sweeping and sphincterotomy. The common bile duct is moderately dilated as is the opacified portion of the intrahepatic biliary tree. There is no definitive opacification of the pancreatic duct. There is suspected faint opacification of the residual cystic duct without definitive evidence of a bile leak. IMPRESSION: 1. ERCP with biliary stent removal, balloon cholangiogram with subsequent sweeping and presumed sphincterotomy. 2. Faint opacification of the residual cystic duct without definitive evidence of a residual bile leak. These images were submitted for radiologic interpretation only. Please see the procedural report for  the amount of contrast and the fluoroscopy time utilized. Electronically Signed   By: Sandi Mariscal M.D.   On: 12/08/2022 15:06   US Abdomen Limited  Result Date: 12/07/2022 CLINICAL DATA:  Elevated liver function tests. History of biliary stent EXAM: ULTRASOUND ABDOMEN LIMITED RIGHT UPPER QUADRANT COMPARISON:  Abdominal CT 07/12/2022 FINDINGS: Gallbladder: Surgically absent Common bile duct: Diameter: 7 mm.  Where visualized, no filling defect. Liver: No focal lesion identified. Within normal limits in parenchymal echogenicity. Portal vein is patent on color Doppler imaging with normal direction of blood flow towards the liver. IMPRESSION: Cholecystectomy without biliary dilatation. Electronically Signed   By: Jorje Guild M.D.   On: 12/07/2022 05:45   DG Chest 2 View  Result Date: 12/07/2022 CLINICAL DATA:  Fever. EXAM: CHEST -  2 VIEW COMPARISON:  July 10, 2022 FINDINGS: The heart size and mediastinal contours are within normal limits. There is moderate severity calcification of the aortic arch. Both lungs are clear. A subcentimeter radiopaque soft tissue foreign body is seen within the anterior aspect of the right shoulder. A radiopaque fusion plate and screws are seen overlying the lower cervical spine. Multilevel degenerative changes are noted throughout the thoracic spine. IMPRESSION: No active cardiopulmonary disease. Electronically Signed   By: Virgina Norfolk M.D.   On: 12/07/2022 01:38    Microbiology: Results for orders placed or performed during the hospital encounter of 12/07/22  Blood culture (routine x 2)     Status: Abnormal   Collection Time: 12/07/22  1:11 AM   Specimen: BLOOD RIGHT ARM  Result Value Ref Range Status   Specimen Description BLOOD RIGHT ARM  Final   Special Requests   Final    BOTTLES DRAWN AEROBIC AND ANAEROBIC Blood Culture results may not be optimal due to an excessive volume of blood received in culture bottles   Culture  Setup Time   Final    ANAEROBIC  BOTTLE ONLY GRAM NEGATIVE RODS CRITICAL RESULT CALLED TO, READ BACK BY AND VERIFIED WITH:  C/ PHARMD E. MARTIN 12/07/22 1518 A. LAFRANCE    Culture KLEBSIELLA PNEUMONIAE (A)  Final   Report Status 12/09/2022 FINAL  Final   Organism ID, Bacteria KLEBSIELLA PNEUMONIAE  Final      Susceptibility   Klebsiella pneumoniae - MIC*    AMPICILLIN RESISTANT Resistant     CEFAZOLIN <=4 SENSITIVE Sensitive     CEFEPIME <=0.12 SENSITIVE Sensitive     CEFTAZIDIME <=1 SENSITIVE Sensitive     CEFTRIAXONE <=0.25 SENSITIVE Sensitive     CIPROFLOXACIN <=0.25 SENSITIVE Sensitive     GENTAMICIN <=1 SENSITIVE Sensitive     IMIPENEM <=0.25 SENSITIVE Sensitive     TRIMETH/SULFA <=20 SENSITIVE Sensitive     AMPICILLIN/SULBACTAM 4 SENSITIVE Sensitive     PIP/TAZO <=4 SENSITIVE Sensitive     * KLEBSIELLA PNEUMONIAE  Urine Culture     Status: Abnormal   Collection Time: 12/07/22  1:11 AM   Specimen: Urine, Clean Catch  Result Value Ref Range Status   Specimen Description URINE, CLEAN CATCH  Final   Special Requests NONE  Final   Culture (A)  Final    <10,000 COLONIES/mL INSIGNIFICANT GROWTH Performed at [facility], [address], [place], [place] [address]    Report Status 12/08/2022 FINAL  Final  Resp panel by RT-PCR (RSV, Flu A&B, Covid) Anterior Nasal Swab     Status: None   Collection Time: 12/07/22  1:11 AM   Specimen: Anterior Nasal Swab  Result Value Ref Range Status   SARS Coronavirus 2 by RT PCR NEGATIVE NEGATIVE Final    Comment: (NOTE) SARS-CoV-2 target nucleic acids are NOT DETECTED.  The SARS-CoV-2 RNA is generally detectable in upper respiratory specimens during the acute phase of infection. The lowest concentration of SARS-CoV-2 viral copies this assay can detect is 138 copies/mL. A negative result does not preclude SARS-Cov-2 infection and should not be used as the sole basis for treatment or other patient management decisions. A negative result may occur with  improper  specimen collection/handling, submission of specimen other than nasopharyngeal swab, presence of viral mutation(s) within the areas targeted by this assay, and inadequate number of viral copies(<138 copies/mL). A negative result must be combined with clinical observations, patient history, and epidemiological information. The expected result is Negative.  Fact Sheet for Patients:  EntrepreneurPulse.com.au  Fact Sheet for Healthcare Providers:  IncredibleEmployment.be  This test is no t yet approved or cleared by the [place] FDA and  has been authorized for detection and/or diagnosis of SARS-CoV-2 by FDA under an Emergency Use Authorization (EUA). This EUA will remain  in effect (meaning this test can be used) for the duration of the COVID-19 declaration under Section 564(b)(1) of the Act, 21 U.S.C.section 360bbb-3(b)(1), unless the authorization is terminated  or revoked sooner.       Influenza A by PCR NEGATIVE NEGATIVE Final   Influenza B by PCR NEGATIVE NEGATIVE Final    Comment: (NOTE) The Xpert Xpress SARS-CoV-2/FLU/RSV plus assay is intended as an aid in the diagnosis of influenza from Nasopharyngeal swab specimens and should not be used as a sole basis for treatment. Nasal washings and aspirates are unacceptable for Xpert Xpress SARS-CoV-2/FLU/RSV testing.  Fact Sheet for Patients: EntrepreneurPulse.com.au  Fact Sheet for Healthcare Providers: IncredibleEmployment.be  This test is not yet approved or cleared by the [place] FDA and has been authorized for detection and/or diagnosis of SARS-CoV-2 by FDA under an Emergency Use Authorization (EUA). This EUA will remain in effect (meaning this test can be used) for the duration of the COVID-19 declaration under Section 564(b)(1) of the Act, 21 U.S.C. section 360bbb-3(b)(1), unless the authorization is terminated or revoked.     Resp  Syncytial Virus by PCR NEGATIVE NEGATIVE Final    Comment: (NOTE) Fact Sheet for Patients: EntrepreneurPulse.com.au  Fact Sheet for Healthcare Providers: IncredibleEmployment.be  This test is not yet approved or cleared by the [place] FDA and has been authorized for detection and/or diagnosis of SARS-CoV-2 by FDA under an Emergency Use Authorization (EUA). This EUA will remain in effect (meaning this test can be used) for the duration of the COVID-19 declaration under Section 564(b)(1) of the Act, 21 U.S.C. section 360bbb-3(b)(1), unless the authorization is terminated or revoked.  Performed at [facility], [place] [address], [place], [place] [address]   Blood Culture ID Panel (Reflexed)     Status: Abnormal   Collection Time: 12/07/22  1:11 AM  Result Value Ref Range Status   Enterococcus faecalis NOT DETECTED NOT DETECTED Final   Enterococcus Faecium NOT DETECTED NOT DETECTED Final   Listeria monocytogenes NOT DETECTED NOT DETECTED Final   Staphylococcus species NOT DETECTED NOT DETECTED Final   Staphylococcus aureus (BCID) NOT DETECTED NOT DETECTED Final   Staphylococcus epidermidis NOT DETECTED NOT DETECTED Final   Staphylococcus lugdunensis NOT DETECTED NOT DETECTED Final   Streptococcus species NOT DETECTED NOT DETECTED Final   Streptococcus agalactiae NOT DETECTED NOT DETECTED Final   Streptococcus pneumoniae NOT DETECTED NOT DETECTED Final   Streptococcus pyogenes NOT DETECTED NOT DETECTED Final   A.calcoaceticus-baumannii NOT DETECTED NOT DETECTED Final   Bacteroides fragilis NOT DETECTED NOT DETECTED Final   Enterobacterales DETECTED (A) NOT DETECTED Final    Comment: Enterobacterales represent a large order of gram negative bacteria, not a single organism. CRITICAL RESULT CALLED TO, READ BACK BY AND VERIFIED WITH:  C/ PHARMD E. MARTIN 12/07/22 1518 A. LAFRANCE    Enterobacter cloacae complex NOT DETECTED NOT DETECTED  Final   Escherichia coli NOT DETECTED NOT DETECTED Final   Klebsiella aerogenes NOT DETECTED NOT DETECTED Final   Klebsiella oxytoca NOT DETECTED NOT DETECTED Final   Klebsiella pneumoniae DETECTED (A) NOT DETECTED Final    Comment: CRITICAL RESULT CALLED TO, READ BACK BY AND VERIFIED WITH:  C/  Winfred 12/07/22 1518 A. LAFRANCE    Proteus species NOT DETECTED NOT DETECTED Final   Salmonella species NOT DETECTED NOT DETECTED Final   Serratia marcescens NOT DETECTED NOT DETECTED Final   Haemophilus influenzae NOT DETECTED NOT DETECTED Final   Neisseria meningitidis NOT DETECTED NOT DETECTED Final   Pseudomonas aeruginosa NOT DETECTED NOT DETECTED Final   Stenotrophomonas maltophilia NOT DETECTED NOT DETECTED Final   Candida albicans NOT DETECTED NOT DETECTED Final   Candida auris NOT DETECTED NOT DETECTED Final   Candida glabrata NOT DETECTED NOT DETECTED Final   Candida krusei NOT DETECTED NOT DETECTED Final   Candida parapsilosis NOT DETECTED NOT DETECTED Final   Candida tropicalis NOT DETECTED NOT DETECTED Final   Cryptococcus neoformans/gattii NOT DETECTED NOT DETECTED Final   CTX-M ESBL NOT DETECTED NOT DETECTED Final   Carbapenem resistance IMP NOT DETECTED NOT DETECTED Final   Carbapenem resistance KPC NOT DETECTED NOT DETECTED Final   Carbapenem resistance NDM NOT DETECTED NOT DETECTED Final   Carbapenem resist OXA 48 LIKE NOT DETECTED NOT DETECTED Final   Carbapenem resistance VIM NOT DETECTED NOT DETECTED Final    Comment: Performed at [facility], [address] [place], [place] [address]  Blood culture (routine x 2)     Status: Abnormal   Collection Time: 12/07/22  3:07 AM   Specimen: BLOOD LEFT HAND  Result Value Ref Range Status   Specimen Description BLOOD LEFT HAND  Final   Special Requests   Final    BOTTLES DRAWN AEROBIC AND ANAEROBIC Blood Culture adequate volume   Culture  Setup Time   Final    GRAM NEGATIVE RODS IN BOTH AEROBIC AND ANAEROBIC  BOTTLES CRITICAL RESULT CALLED TO, READ BACK BY AND VERIFIED WITH: PHARMD KAILEY MIKOLICHEK ON 06/25/16 @ 5102 BY DRT    Culture (A)  Final    KLEBSIELLA PNEUMONIAE SUSCEPTIBILITIES PERFORMED ON PREVIOUS CULTURE WITHIN THE LAST 5 DAYS. Performed at [facility], [place] [address] [place], [place] [address]    Report Status 12/09/2022 FINAL  Final    Labs: CBC: Recent Labs  Lab 12/08/22 0653 12/09/22 0407 12/12/22 0725  WBC 7.4 7.8 6.6  HGB 10.5* 10.9* 12.1*  HCT 32.3* 32.0* 34.5*  MCV 97.6 95.8 92.2  PLT 206 215 782   Basic Metabolic Panel: Recent Labs  Lab 12/08/22 0653 12/09/22 0407 12/12/22 0725 12/13/22 1118  NA 135 135 135 135  K 4.0 4.2 3.9 4.3  CL 101 99 100 102  CO2 '25 28 25 24  '$ GLUCOSE 127* 105* 154* 111*  BUN '12 11 11 11  '$ CREATININE 1.22 1.22 1.45* 1.21  CALCIUM 8.4* 8.4* 8.5* 8.6*   Liver Function Tests: Recent Labs  Lab 12/08/22 0653 12/09/22 0407 12/12/22 0725  AST 38 29 33  ALT 74* 59* 21  ALKPHOS 264* 227* 184*  BILITOT 1.3* 1.1 0.7  PROT 5.2* 5.0* 5.5*  ALBUMIN 2.5* 2.5* 3.0*   CBG: No results for input(s): "GLUCAP" in the last 168 hours.  Discharge time spent: greater than 30 minutes.  Signed: Elmarie Shiley, MD [facility] 12/14/2022

## 2022-12-15 ENCOUNTER — Ambulatory Visit: Payer: Medicare Other | Admitting: Internal Medicine

## 2022-12-15 DIAGNOSIS — N179 Acute kidney failure, unspecified: Secondary | ICD-10-CM | POA: Diagnosis not present

## 2022-12-15 DIAGNOSIS — N390 Urinary tract infection, site not specified: Secondary | ICD-10-CM | POA: Diagnosis not present

## 2022-12-15 DIAGNOSIS — N189 Chronic kidney disease, unspecified: Secondary | ICD-10-CM | POA: Diagnosis not present

## 2022-12-15 DIAGNOSIS — R748 Abnormal levels of other serum enzymes: Secondary | ICD-10-CM | POA: Diagnosis not present

## 2022-12-15 DIAGNOSIS — Z79899 Other long term (current) drug therapy: Secondary | ICD-10-CM | POA: Diagnosis not present

## 2022-12-15 DIAGNOSIS — R1312 Dysphagia, oropharyngeal phase: Secondary | ICD-10-CM | POA: Diagnosis not present

## 2022-12-16 DIAGNOSIS — N179 Acute kidney failure, unspecified: Secondary | ICD-10-CM | POA: Diagnosis not present

## 2022-12-16 DIAGNOSIS — A419 Sepsis, unspecified organism: Secondary | ICD-10-CM | POA: Diagnosis not present

## 2022-12-16 DIAGNOSIS — N390 Urinary tract infection, site not specified: Secondary | ICD-10-CM | POA: Diagnosis not present

## 2022-12-18 DIAGNOSIS — E43 Unspecified severe protein-calorie malnutrition: Secondary | ICD-10-CM | POA: Diagnosis not present

## 2022-12-18 DIAGNOSIS — Z7189 Other specified counseling: Secondary | ICD-10-CM | POA: Diagnosis not present

## 2022-12-18 DIAGNOSIS — E559 Vitamin D deficiency, unspecified: Secondary | ICD-10-CM | POA: Diagnosis not present

## 2022-12-18 DIAGNOSIS — R627 Adult failure to thrive: Secondary | ICD-10-CM | POA: Diagnosis not present

## 2022-12-18 DIAGNOSIS — N189 Chronic kidney disease, unspecified: Secondary | ICD-10-CM | POA: Diagnosis not present

## 2022-12-21 ENCOUNTER — Encounter: Payer: Self-pay | Admitting: Physician Assistant

## 2022-12-21 DIAGNOSIS — I129 Hypertensive chronic kidney disease with stage 1 through stage 4 chronic kidney disease, or unspecified chronic kidney disease: Secondary | ICD-10-CM | POA: Diagnosis not present

## 2022-12-21 DIAGNOSIS — R627 Adult failure to thrive: Secondary | ICD-10-CM | POA: Diagnosis not present

## 2022-12-21 DIAGNOSIS — N182 Chronic kidney disease, stage 2 (mild): Secondary | ICD-10-CM | POA: Diagnosis not present

## 2022-12-21 DIAGNOSIS — E785 Hyperlipidemia, unspecified: Secondary | ICD-10-CM | POA: Diagnosis not present

## 2022-12-24 DIAGNOSIS — R7881 Bacteremia: Secondary | ICD-10-CM | POA: Diagnosis not present

## 2022-12-24 DIAGNOSIS — Z79899 Other long term (current) drug therapy: Secondary | ICD-10-CM | POA: Diagnosis not present

## 2022-12-24 DIAGNOSIS — N179 Acute kidney failure, unspecified: Secondary | ICD-10-CM | POA: Diagnosis not present

## 2022-12-24 DIAGNOSIS — E43 Unspecified severe protein-calorie malnutrition: Secondary | ICD-10-CM | POA: Diagnosis not present

## 2022-12-24 DIAGNOSIS — R1312 Dysphagia, oropharyngeal phase: Secondary | ICD-10-CM | POA: Diagnosis not present

## 2022-12-24 DIAGNOSIS — R748 Abnormal levels of other serum enzymes: Secondary | ICD-10-CM | POA: Diagnosis not present

## 2022-12-24 DIAGNOSIS — N390 Urinary tract infection, site not specified: Secondary | ICD-10-CM | POA: Diagnosis not present

## 2022-12-24 DIAGNOSIS — R627 Adult failure to thrive: Secondary | ICD-10-CM | POA: Diagnosis not present

## 2022-12-24 DIAGNOSIS — N189 Chronic kidney disease, unspecified: Secondary | ICD-10-CM | POA: Diagnosis not present

## 2022-12-27 ENCOUNTER — Other Ambulatory Visit: Payer: Self-pay | Admitting: Nurse Practitioner

## 2022-12-27 DIAGNOSIS — K219 Gastro-esophageal reflux disease without esophagitis: Secondary | ICD-10-CM

## 2022-12-28 NOTE — Telephone Encounter (Signed)
Name from pharmacy: Pantoprazole Sodium 20 MG Oral Tablet Delayed Release        Will file in chart as: pantoprazole (PROTONIX) 20 MG tablet   The original prescription was discontinued on 12/14/2022 by Elmarie Shiley, MD for the following reason: Stop Taking at Discharge. Renewing this prescription may not be appropriate.    Pt was changed to 40 mg by Dr Tyrell Antonio. Next OV 01/01/23

## 2023-01-01 ENCOUNTER — Ambulatory Visit: Payer: Medicare Other | Admitting: Nurse Practitioner

## 2023-01-04 ENCOUNTER — Encounter: Payer: Self-pay | Admitting: Nurse Practitioner

## 2023-01-04 ENCOUNTER — Ambulatory Visit: Payer: Medicare Other

## 2023-01-06 DIAGNOSIS — E559 Vitamin D deficiency, unspecified: Secondary | ICD-10-CM | POA: Diagnosis not present

## 2023-01-06 DIAGNOSIS — I129 Hypertensive chronic kidney disease with stage 1 through stage 4 chronic kidney disease, or unspecified chronic kidney disease: Secondary | ICD-10-CM | POA: Diagnosis not present

## 2023-01-06 DIAGNOSIS — N182 Chronic kidney disease, stage 2 (mild): Secondary | ICD-10-CM | POA: Diagnosis not present

## 2023-01-06 DIAGNOSIS — E785 Hyperlipidemia, unspecified: Secondary | ICD-10-CM | POA: Diagnosis not present

## 2023-01-07 DIAGNOSIS — R627 Adult failure to thrive: Secondary | ICD-10-CM | POA: Diagnosis not present

## 2023-01-07 DIAGNOSIS — Z79899 Other long term (current) drug therapy: Secondary | ICD-10-CM | POA: Diagnosis not present

## 2023-01-07 DIAGNOSIS — A419 Sepsis, unspecified organism: Secondary | ICD-10-CM | POA: Diagnosis not present

## 2023-01-07 DIAGNOSIS — N390 Urinary tract infection, site not specified: Secondary | ICD-10-CM | POA: Diagnosis not present

## 2023-01-07 DIAGNOSIS — N189 Chronic kidney disease, unspecified: Secondary | ICD-10-CM | POA: Diagnosis not present

## 2023-01-07 DIAGNOSIS — R1312 Dysphagia, oropharyngeal phase: Secondary | ICD-10-CM | POA: Diagnosis not present

## 2023-01-07 DIAGNOSIS — R748 Abnormal levels of other serum enzymes: Secondary | ICD-10-CM | POA: Diagnosis not present

## 2023-01-07 DIAGNOSIS — N179 Acute kidney failure, unspecified: Secondary | ICD-10-CM | POA: Diagnosis not present

## 2023-01-07 DIAGNOSIS — E43 Unspecified severe protein-calorie malnutrition: Secondary | ICD-10-CM | POA: Diagnosis not present

## 2023-01-11 ENCOUNTER — Telehealth: Payer: Self-pay | Admitting: Nurse Practitioner

## 2023-01-11 NOTE — Telephone Encounter (Signed)
Contacted Durwood L Gibbons to schedule their annual wellness visit. Appointment made for 01/12/2023.  Thank you,  Colletta [place],  [place] Program Direct Dial ??[phone]

## 2023-01-12 ENCOUNTER — Other Ambulatory Visit: Payer: Self-pay | Admitting: Nurse Practitioner

## 2023-01-12 ENCOUNTER — Ambulatory Visit (INDEPENDENT_AMBULATORY_CARE_PROVIDER_SITE_OTHER): Payer: Medicare Other

## 2023-01-12 ENCOUNTER — Other Ambulatory Visit: Payer: Self-pay | Admitting: Cardiology

## 2023-01-12 VITALS — Ht 69.0 in | Wt 163.0 lb

## 2023-01-12 DIAGNOSIS — E782 Mixed hyperlipidemia: Secondary | ICD-10-CM

## 2023-01-12 DIAGNOSIS — Z0000 Encounter for general adult medical examination without abnormal findings: Secondary | ICD-10-CM

## 2023-01-12 NOTE — Progress Notes (Signed)
Subjective:   Francisco [name] is a 82 y.o. male who presents for Medicare Annual/Subsequent preventive examination. I connected with  [name] on 01/12/23 by a audio enabled telemedicine application and verified that I am speaking with the correct person using two identifiers.  Patient Location: Home  Provider Location: Home Office  I discussed the limitations of evaluation and management by telemedicine. The patient expressed understanding and agreed to proceed.  Review of Systems     Cardiac Risk Factors include: advanced age (>52mn, >>49women);male gender;hypertension;dyslipidemia     Objective:    Today's Vitals   01/12/23 1357  Weight: 163 lb (73.9 kg)  Height: '5\' 9"'$  (1.753 m)   Body mass index is 24.07 kg/m.     01/12/2023    2:00 PM 12/07/2022    1:00 PM 12/07/2022   12:59 AM 08/18/2022   11:17 AM 08/12/2022    3:30 AM 07/09/2022    7:45 PM 07/09/2022    2:16 PM  Advanced Directives  Does Patient Have a Medical Advance Directive? Yes Yes No No No  Yes  Type of AParamedicof ADillardLiving will Living will;Out of facility DNR (pink MOST or yellow form)     [place] will  Does patient want to make changes to medical advance directive?  No - Guardian declined       Copy of HCulverin Chart? No - copy requested      No - copy requested  Would patient like information on creating a medical advance directive?  No - Patient declined No - Patient declined No - Patient declined No - Patient declined No - Patient declined   Pre-existing out of facility DNR order (yellow form or pink MOST form)  Physician notified to receive inpatient order         Current Medications (verified) Outpatient Encounter Medications as of 01/12/2023  Medication Sig   aspirin 81 MG tablet Take 81 mg by mouth daily.   escitalopram (LEXAPRO) 10 MG tablet Take 2 tablets (20 mg total) by mouth daily.   metoprolol succinate  (TOPROL-XL) 25 MG 24 hr tablet TAKE 1 TABLET BY MOUTH TWICE  DAILY   pantoprazole (PROTONIX) 20 MG tablet TAKE 1 TABLET BY MOUTH DAILY   pantoprazole (PROTONIX) 40 MG tablet Take 1 tablet (40 mg total) by mouth daily.   polyethylene glycol (MIRALAX / GLYCOLAX) 17 g packet Take 17 g by mouth 2 (two) times daily.   senna (SENOKOT) 8.6 MG TABS tablet Take 1 tablet (8.6 mg total) by mouth daily.   tamsulosin (FLOMAX) 0.4 MG CAPS capsule Take 1 capsule (0.4 mg total) by mouth daily.   No facility-administered encounter medications on file as of 01/12/2023.    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;  Surgeon: FIran Planas MD;  [place] [facility]  Service: Orthopedics;  Laterality: Left;   BILIARY STENT PLACEMENT N/A 07/09/2022   Procedure: BILIARY STENT PLACEMENT;  Surgeon: SLadene Artist MD;  Location: [facility];  Service: Gastroenterology;  Laterality: N/A;   CHOLECYSTECTOMY N/A 07/05/2022   Procedure: LAPAROSCOPIC CHOLECYSTECTOMY;  Surgeon: AMichaelle Birks  L, MD;  Location: [facility];  Service: General;  Laterality: N/A;   ENDOSCOPIC RETROGRADE CHOLANGIOPANCREATOGRAPHY (ERCP) WITH PROPOFOL N/A 12/08/2022   Procedure: ENDOSCOPIC RETROGRADE CHOLANGIOPANCREATOGRAPHY (ERCP) WITH PROPOFOL;  Surgeon: Ladene Artist, MD;  Location: [facility];  Service: Gastroenterology;  Laterality: N/A;   ERCP N/A 07/09/2022   Procedure: ENDOSCOPIC RETROGRADE CHOLANGIOPANCREATOGRAPHY (ERCP);  Surgeon: Ladene Artist, MD;  Location: [facility];  Service: Gastroenterology;  Laterality: N/A;   ESOPHAGOGASTRODUODENOSCOPY (EGD) WITH  PROPOFOL N/A 12/08/2022   Procedure: ESOPHAGOGASTRODUODENOSCOPY (EGD) WITH PROPOFOL;  Surgeon: Mauri Pole, MD;  Location: [facility];  Service: Gastroenterology;  Laterality: N/A;   POSTERIOR CERVICAL FUSION/FORAMINOTOMY N/A 08/18/2022   Procedure: C4-5 Posterior cervical fusion with lateral mass fixation;  Surgeon: Ashok Pall, MD;  Location: [facility];  Service: Neurosurgery;  Laterality: N/A;   REMOVAL OF STONES  12/08/2022   Procedure: REMOVAL OF STONES;  Surgeon: Ladene Artist, MD;  Location: [facility];  Service: Gastroenterology;;   [name]  07/09/2022   Procedure: SPHINCTEROTOMY;  Surgeon: Ladene Artist, MD;  Location: [facility];  Service: Gastroenterology;;   [place] REMOVAL  12/08/2022   Procedure: STENT REMOVAL;  Surgeon: Ladene Artist, MD;  Location: [facility];  Service: Gastroenterology;;   Family History  Problem Relation Age of Onset   Healthy Mother    Transient ischemic attack Father    Rheum arthritis Daughter    Amblyopia Neg Hx    Blindness Neg Hx    Cataracts Neg Hx    Glaucoma Neg Hx    Macular degeneration Neg Hx    Retinal detachment Neg Hx    Strabismus Neg Hx    Retinitis pigmentosa Neg Hx    Social History   Socioeconomic History   Marital status: Widowed    Spouse name: Not on file   Number of children: 2   Years of education: Not on file   Highest education level: Not on file  Occupational History   Occupation: Retired    Fish farm manager: [employer]  Tobacco Use   Smoking status: Former    Packs/day: 1.00    Years: 15.00    Total pack years: 15.00    Types: Cigarettes    Quit date: 02/23/1972    Years since quitting: 50.9   Smokeless tobacco: Never  Vaping Use   Vaping Use: Never used  Substance and Sexual Activity   Alcohol use: Yes    Alcohol/week: 1.0 standard drink of alcohol    Types: 1 Standard drinks or equivalent per week    Comment: occasional beer, very  little   Drug use: No   Sexual  activity: Not Currently  Other Topics Concern   Not on file  Social History Narrative   2 daughters nearby   Social Determinants of Health   Financial Resource Strain: Low Risk  (01/12/2023)   Overall Financial Resource Strain (CARDIA)    Difficulty of Paying Living Expenses: Not hard at all  Food Insecurity: No Food Insecurity (01/12/2023)   Hunger Vital Sign    Worried About Running Out of Food in the Last Year: Never true    Ran Out of Food in the Last Year: Never true  Transportation Needs: No Transportation Needs (01/12/2023)   PRAPARE - Hydrologist (Medical): No    Lack of Transportation (Non-Medical): No  Physical Activity: Insufficiently Active (01/12/2023)   Exercise Vital Sign    Days of Exercise per Week:  3 days    Minutes of Exercise per Session: 30 min  Stress: No Stress Concern Present (01/12/2023)   [place]    Feeling of Stress : Not at all  Social Connections: Socially Isolated (01/12/2023)   Social Connection and Isolation Panel [NHANES]    Frequency of Communication with Friends and Family: More than three times a week    Frequency of Social Gatherings with Friends and Family: More than three times a week    Attends Religious Services: Never    Marine scientist or Organizations: No    Attends Archivist Meetings: Never    Marital Status: Widowed    Tobacco Counseling Counseling given: Not Answered   Clinical Intake:  Pre-visit preparation completed: Yes  Pain : No/denies pain     Nutritional Risks: None Diabetes: No  How often do you need to have someone help you when you read instructions, pamphlets, or other written materials from your doctor or pharmacy?: 1 - Never  Diabetic?no   Interpreter Needed?: No  Information entered by :: Jadene Pierini, LPN   Activities of Daily Living    01/12/2023    2:00 PM 12/07/2022    1:00 PM  In  your present state of health, do you have any difficulty performing the following activities:  Hearing? 0 0  Vision? 0 0  Difficulty concentrating or making decisions? 0 0  Walking or climbing stairs? 1 1  Dressing or bathing? 0 0  Doing errands, shopping? 1 1  Preparing Food and eating ? Y   Using the Toilet? N   In the past six months, have you accidently leaked urine? N   Do you have problems with loss of bowel control? N   Managing your Medications? Y   Managing your Finances? Y   Housekeeping or managing your Housekeeping? Y     Patient Care Team: [name], FNP as PCP - General (Nurse Practitioner)  Indicate any recent Medical Services you may have received from other than [facility] providers in the past year (date may be approximate).     Assessment:   This is a routine wellness examination for Eliu.  Hearing/Vision screen Vision Screening - Comments:: Wears rx glasses - up to date with routine eye exams with  Dr.Lee   Dietary issues and exercise activities discussed: Current Exercise Habits: Home exercise routine, Type of exercise: walking, Time (Minutes): 30, Frequency (Times/Week): 3, Weekly Exercise (Minutes/Week): 90, Intensity: Mild, Exercise limited by: None identified   Goals Addressed             This Visit's Progress    DIET - INCREASE WATER INTAKE   On track      Depression Screen    01/12/2023    1:59 PM 08/14/2022    2:08 PM 05/14/2022    2:06 PM 03/04/2022    3:27 PM 11/21/2021   11:13 AM 08/08/2021   11:21 AM 05/12/2021    2:53 PM  PHQ 2/9 Scores  PHQ - 2 Score 0 '1 1 2 4 2 3  '$ PHQ- 9 Score  '3 5 14 6 9 11    '$ Fall Risk    01/12/2023    1:58 PM 08/14/2022    2:07 PM 05/14/2022    2:06 PM 03/17/2022    9:44 AM 03/04/2022    3:27 PM  Fall Risk   Falls in the past year? 0 1 0  1  Number falls in  past yr: 0 1     Injury with Fall? 0 1     Risk for fall due to : No Fall Risks      Follow up Falls prevention discussed Falls prevention  discussed  Falls evaluation completed     [place]:  Any stairs in or around the home? No  If so, are there any without handrails? No  Home free of loose throw rugs in walkways, pet beds, electrical cords, etc? Yes  Adequate lighting in your home to reduce risk of falls? Yes   ASSISTIVE DEVICES UTILIZED TO PREVENT FALLS:  Life alert? No  Use of a cane, walker or w/c? Yes  Grab bars in the bathroom? Yes  Shower chair or bench in shower? Yes  Elevated toilet seat or a handicapped toilet? Yes       09/14/2017    9:24 AM  MMSE - Mini Mental State Exam  Orientation to time 3  Orientation to Place 5  Registration 3  Attention/ Calculation 5  Recall 2  Language- name 2 objects 2  Language- repeat 1  Language- follow 3 step command 3  Language- read & follow direction 1  Write a sentence 1  Copy design 0  Total score 26        01/12/2023    2:01 PM 08/08/2021   11:26 AM 09/22/2018   11:07 AM  6CIT Screen  What Year? 0 points 0 points 0 points  What month? 0 points 0 points 0 points  What time? 0 points 0 points 0 points  Count back from 20 0 points 0 points 0 points  Months in reverse 0 points 2 points 2 points  Repeat phrase 2 points 2 points 0 points  Total Score 2 points 4 points 2 points    Immunizations Immunization History  Administered Date(s) Administered   Fluad Quad(high Dose 65+) 08/01/2019, 09/12/2020, 09/04/2021, 08/14/2022   Influenza, High Dose Seasonal PF 09/10/2016, 09/02/2018   Influenza,inj,Quad PF,6+ Mos 08/28/2013, 09/03/2014, 08/09/2017   Influenza-Unspecified 12/03/2015   Moderna Covid-19 Vaccine Bivalent Booster 49yr & up 10/20/2021   Moderna SARS-COV2 Booster Vaccination 10/16/2020   Moderna Sars-Covid-2 Vaccination 12/09/2019, 01/06/2020   Pneumococcal Conjugate-13 10/18/2013   Pneumococcal Polysaccharide-23 06/16/2015   Tdap 08/05/2016, 08/15/2021    TDAP status: Up to date  Flu Vaccine status: Up  to date  Pneumococcal vaccine status: Up to date  Covid-19 vaccine status: Completed vaccines  Qualifies for Shingles Vaccine? Yes   Zostavax completed No   Shingrix Completed?: No.    Education has been provided regarding the importance of this vaccine. Patient has been advised to call insurance company to determine out of pocket expense if they have not yet received this vaccine. Advised may also receive vaccine at local pharmacy or Health Dept. Verbalized acceptance and understanding.  Screening Tests Health Maintenance  Topic Date Due   Zoster Vaccines- Shingrix (1 of 2) Never done   COVID-19 Vaccine (5 - 2023-24 season) 07/17/2022   Medicare Annual Wellness (AWV)  01/13/2024   DTaP/Tdap/Td (3 - Td or Tdap) 08/16/2031   Pneumonia Vaccine 82 Years old  Completed   INFLUENZA VACCINE  Completed   HPV VACCINES  Aged Out    Health Maintenance  Health Maintenance Due  Topic Date Due   Zoster Vaccines- Shingrix (1 of 2) Never done   COVID-19 Vaccine (5 - 2023-24 season) 07/17/2022    Colorectal cancer screening: No longer required.   Lung  Cancer Screening: (Low Dose CT Chest recommended if Age 52-80 years, 30 pack-year currently smoking OR have quit w/in 15years.) does not qualify.   Lung Cancer Screening Referral: n/a  Additional Screening:  Hepatitis C Screening: does not qualify;   Vision Screening: Recommended annual ophthalmology exams for early detection of glaucoma and other disorders of the eye. Is the patient up to date with their annual eye exam?  Yes  Who is the provider or what is the name of the office in which the patient attends annual eye exams? Dr.Lee  If pt is not established with a provider, would they like to be referred to a provider to establish care? No .   Dental Screening: Recommended annual dental exams for proper oral hygiene  Community Resource Referral / Chronic Care Management: CRR required this visit?  No   CCM required this visit?  No       Plan:     I have personally reviewed and noted the following in the patient's chart:   Medical and social history Use of alcohol, tobacco or illicit drugs  Current medications and supplements including opioid prescriptions. Patient is not currently taking opioid prescriptions. Functional ability and status Nutritional status Physical activity Advanced directives List of other physicians Hospitalizations, surgeries, and ER visits in previous 12 months Vitals Screenings to include cognitive, depression, and falls Referrals and appointments  In addition, I have reviewed and discussed with patient certain preventive protocols, quality metrics, and best practice recommendations. A written personalized care plan for preventive services as well as general preventive health recommendations were provided to patient.     Daphane Shepherd, LPN   [id number]   Nurse Notes: Home now with Daughter s

## 2023-01-12 NOTE — Patient Instructions (Signed)
Francisco [name] , Thank you for taking time to come for your Medicare Wellness Visit. I appreciate your ongoing commitment to your health goals. Please review the following plan we discussed and let me know if I can assist you in the future.   These are the goals we discussed:  Goals      DIET - DECREASE SODA OR JUICE INTAKE     Decrease soda. If you're going to have one I recommend drinking it at one sitting and then sipping on water throughout the day.      DIET - INCREASE WATER INTAKE     Exercise 150 minutes per week (moderate activity)        This is a list of the screening recommended for you and due dates:  Health Maintenance  Topic Date Due   Zoster (Shingles) Vaccine (1 of 2) Never done   COVID-19 Vaccine (5 - 2023-24 season) 07/17/2022   Medicare Annual Wellness Visit  01/13/2024   DTaP/Tdap/Td vaccine (3 - Td or Tdap) 08/16/2031   Pneumonia Vaccine  Completed   Flu Shot  Completed   HPV Vaccine  Aged Out    Advanced directives: Please bring a copy of your health care power of attorney and living will to the office to be added to your chart at your convenience.   Conditions/risks identified: Aim for 30 minutes of exercise or brisk walking, 6-8 glasses of water, and 5 servings of fruits and vegetables each day.   Next appointment: Follow up in one year for your annual wellness visit.   Preventive Care 23 Years and Older, Male  Preventive care refers to lifestyle choices and visits with your health care provider that can promote health and wellness. What does preventive care include? A yearly physical exam. This is also called an annual well check. Dental exams once or twice a year. Routine eye exams. Ask your health care provider how often you should have your eyes checked. Personal lifestyle choices, including: Daily care of your teeth and gums. Regular physical activity. Eating a healthy diet. Avoiding tobacco and drug use. Limiting alcohol use. Practicing safe  sex. Taking low doses of aspirin every day. Taking vitamin and mineral supplements as recommended by your health care provider. What happens during an annual well check? The services and screenings done by your health care provider during your annual well check will depend on your age, overall health, lifestyle risk factors, and family history of disease. Counseling  Your health care provider may ask you questions about your: Alcohol use. Tobacco use. Drug use. Emotional well-being. Home and relationship well-being. Sexual activity. Eating habits. History of falls. Memory and ability to understand (cognition). Work and work Statistician. Screening  You may have the following tests or measurements: Height, weight, and BMI. Blood pressure. Lipid and cholesterol levels. These may be checked every 5 years, or more frequently if you are over 67 years old. Skin check. Lung cancer screening. You may have this screening every year starting at age 73 if you have a 30-pack-year history of smoking and currently smoke or have quit within the past 15 years. Fecal occult blood test (FOBT) of the stool. You may have this test every year starting at age 21. Flexible sigmoidoscopy or colonoscopy. You may have a sigmoidoscopy every 5 years or a colonoscopy every 10 years starting at age 8. Prostate cancer screening. Recommendations will vary depending on your family history and other risks. Hepatitis C blood test. Hepatitis B blood test. Sexually transmitted  disease (STD) testing. Diabetes screening. This is done by checking your blood sugar (glucose) after you have not eaten for a while (fasting). You may have this done every 1-3 years. Abdominal aortic aneurysm (AAA) screening. You may need this if you are a current or former smoker. Osteoporosis. You may be screened starting at age 75 if you are at high risk. Talk with your health care provider about your test results, treatment options, and if  necessary, the need for more tests. Vaccines  Your health care provider may recommend certain vaccines, such as: Influenza vaccine. This is recommended every year. Tetanus, diphtheria, and acellular pertussis (Tdap, Td) vaccine. You may need a Td booster every 10 years. Zoster vaccine. You may need this after age 56. Pneumococcal 13-valent conjugate (PCV13) vaccine. One dose is recommended after age 14. Pneumococcal polysaccharide (PPSV23) vaccine. One dose is recommended after age 72. Talk to your health care provider about which screenings and vaccines you need and how often you need them. This information is not intended to replace advice given to you by your health care provider. Make sure you discuss any questions you have with your health care provider. Document Released: 11/29/2015 Document Revised: 07/22/2016 Document Reviewed: 09/03/2015 Elsevier Interactive Patient Education  2017 [place] Prevention in the Home Falls can cause injuries. They can happen to people of all ages. There are many things you can do to make your home safe and to help prevent falls. What can I do on the outside of my home? Regularly fix the edges of walkways and driveways and fix any cracks. Remove anything that might make you trip as you walk through a door, such as a raised step or threshold. Trim any bushes or trees on the path to your home. Use bright outdoor lighting. Clear any walking paths of anything that might make someone trip, such as rocks or tools. Regularly check to see if handrails are loose or broken. Make sure that both sides of any steps have handrails. Any raised decks and porches should have guardrails on the edges. Have any leaves, snow, or ice cleared regularly. Use sand or salt on walking paths during winter. Clean up any spills in your garage right away. This includes oil or grease spills. What can I do in the bathroom? Use night lights. Install grab bars by the toilet  and in the tub and shower. Do not use towel bars as grab bars. Use non-skid mats or decals in the tub or shower. If you need to sit down in the shower, use a plastic, non-slip stool. Keep the floor dry. Clean up any water that spills on the floor as soon as it happens. Remove soap buildup in the tub or shower regularly. Attach bath mats securely with double-sided non-slip rug tape. Do not have throw rugs and other things on the floor that can make you trip. What can I do in the bedroom? Use night lights. Make sure that you have a light by your bed that is easy to reach. Do not use any sheets or blankets that are too big for your bed. They should not hang down onto the floor. Have a firm chair that has side arms. You can use this for support while you get dressed. Do not have throw rugs and other things on the floor that can make you trip. What can I do in the kitchen? Clean up any spills right away. Avoid walking on wet floors. Keep items that you use a  lot in easy-to-reach places. If you need to reach something above you, use a strong step stool that has a grab bar. Keep electrical cords out of the way. Do not use floor polish or wax that makes floors slippery. If you must use wax, use non-skid floor wax. Do not have throw rugs and other things on the floor that can make you trip. What can I do with my stairs? Do not leave any items on the stairs. Make sure that there are handrails on both sides of the stairs and use them. Fix handrails that are broken or loose. Make sure that handrails are as long as the stairways. Check any carpeting to make sure that it is firmly attached to the stairs. Fix any carpet that is loose or worn. Avoid having throw rugs at the top or bottom of the stairs. If you do have throw rugs, attach them to the floor with carpet tape. Make sure that you have a light switch at the top of the stairs and the bottom of the stairs. If you do not have them, ask someone to add  them for you. What else can I do to help prevent falls? Wear shoes that: Do not have high heels. Have rubber bottoms. Are comfortable and fit you well. Are closed at the toe. Do not wear sandals. If you use a stepladder: Make sure that it is fully opened. Do not climb a closed stepladder. Make sure that both sides of the stepladder are locked into place. Ask someone to hold it for you, if possible. Clearly mark and make sure that you can see: Any grab bars or handrails. First and last steps. Where the edge of each step is. Use tools that help you move around (mobility aids) if they are needed. These include: Canes. Walkers. Scooters. Crutches. Turn on the lights when you go into a dark area. Replace any light bulbs as soon as they burn out. Set up your furniture so you have a clear path. Avoid moving your furniture around. If any of your floors are uneven, fix them. If there are any pets around you, be aware of where they are. Review your medicines with your doctor. Some medicines can make you feel dizzy. This can increase your chance of falling. Ask your doctor what other things that you can do to help prevent falls. This information is not intended to replace advice given to you by your health care provider. Make sure you discuss any questions you have with your health care provider. Document Released: 08/29/2009 Document Revised: 04/09/2016 Document Reviewed: 12/07/2014 Elsevier Interactive Patient Education  2017 [employer].

## 2023-01-13 NOTE — Telephone Encounter (Signed)
Refill to pharmacy, patient needs appointment for future refills, 1st attempt

## 2023-01-19 ENCOUNTER — Ambulatory Visit: Payer: Medicare Other | Admitting: Nurse Practitioner

## 2023-01-20 ENCOUNTER — Encounter: Payer: Self-pay | Admitting: Nurse Practitioner

## 2023-01-20 NOTE — Progress Notes (Deleted)
01/20/2023 Francisco PADELFORD [id number] Mar 16, 1941  Referring provider: [name], * Primary GI doctor: {acdocs:[id number]}  ASSESSMENT AND PLAN:   There are no diagnoses linked to this encounter.   Patient Care Team: [name], FNP as PCP - General (Nurse Practitioner)  HISTORY OF PRESENT ILLNESS: 82 y.o. male with a past medical history of hypertension, hyperlipidemia, CKD stage IIIa, adenomatous colon polyps 2006, 2011, history of bacteremia 04/2022, chronic constipation, cervical spine fusion and fixation after fall 08/2022, anemia and others listed below presents for evaluation of ***.   08/2005 colonoscopy with colon polyps (adenomas) and diverticulosis 07/2010 colonoscopy severe diverticulosis throughout colon.  Pedunculated polyp (TA) at mid transverse colon removed.   07/05/2022 Laparoscopic, subtotal cholecystectomy, drain placement.   07/09/2022 ERCP with sphincterotomy, biliary stent placement to address postop bile leak.  The limited esophageal visualization during ERCP demonstrated a mild, nonobstructing, distal esophageal stricture but was otherwise normal. 12/07/2022 hospitalization for malaise, anorexia, failure to thrive, dysphagia and elevated liver function.  At admission T. bili 3.9. Alk phos 411. AST/ALT 90/130.  RUQ ultrasound: 7 mm bile duct.  PV Dopplers normal.  Surgically absent gallbladder.  No stent visualized.   12/08/2022 ERCP . Biliary stent removed. Dr. Fuller Plan swept CBD of sludge.  Ceftriaxone 5 days 12/08/2022 EGD.  No esophageal abnormality to explain dysphagia.  Esophagus was empirically dilated.  3 cm hiatal hernia present, biliary stent visualized in duodenum. 12/09/22 Esophagram: Limited study due to patient difficulty repositioning himself.  Narrowing at distal esophagus.  Swallowed 13 mm tablet did not pass beyond GE junction despite prolonged period of observation.  Findings suggest distal esophageal stricture.  Prominent esophageal  dysmotility.  Small HH.  Small volume GERD to level of lower esophagus. Esophagram reviewed by Dr. Silverio Decamp who showed no evidence of stricture on EGD, possible esophageal spasm or dysmotility. SLP bedside swallow study 1/22.  Challenged with pures, full liquids.  Patient felt that these were getting stuck at the time.  He  reports that he quit smoking about 50 years ago. His smoking use included cigarettes. He has a 15.00 pack-year smoking history. He has never used smokeless tobacco. He reports current alcohol use of about 1.0 standard drink of alcohol per week. He reports that he does not use drugs.  RELEVANT LABS AND IMAGING: CBC    Component Value Date/Time   WBC 6.6 12/12/2022 0725   RBC 3.74 (L) 12/12/2022 0725   HGB 12.1 (L) 12/12/2022 0725   HGB 11.9 (L) 08/14/2022 1435   HCT 34.5 (L) 12/12/2022 0725   HCT 35.5 (L) 08/14/2022 1435   PLT 221 12/12/2022 0725   PLT 181 08/14/2022 1435   MCV 92.2 12/12/2022 0725   MCV 94 08/14/2022 1435   MCH 32.4 12/12/2022 0725   MCHC 35.1 12/12/2022 0725   RDW 13.3 12/12/2022 0725   RDW 14.4 08/14/2022 1435   LYMPHSABS 0.3 (L) 12/07/2022 0111   LYMPHSABS 1.1 08/14/2022 1435   MONOABS 0.6 12/07/2022 0111   EOSABS 0.0 12/07/2022 0111   EOSABS 0.3 08/14/2022 1435   BASOSABS 0.0 12/07/2022 0111   BASOSABS 0.0 08/14/2022 1435   Recent Labs    07/11/22 0521 07/12/22 0851 07/13/22 0405 07/14/22 0525 07/17/22 1251 08/14/22 1435 12/07/22 0111 12/08/22 0653 12/09/22 0407 12/12/22 0725  HGB 11.6* 12.1* 11.7* 11.3* 11.1* 11.9* 12.2* 10.5* 10.9* 12.1*     CMP     Component Value Date/Time   NA 135 12/13/2022 1118   NA 139 08/14/2022 1435  K 4.3 12/13/2022 1118   CL 102 12/13/2022 1118   CO2 24 12/13/2022 1118   GLUCOSE 111 (H) 12/13/2022 1118   BUN 11 12/13/2022 1118   BUN 20 08/14/2022 1435   CREATININE 1.21 12/13/2022 1118   CREATININE 0.86 04/12/2013 1145   CALCIUM 8.6 (L) 12/13/2022 1118   PROT 5.5 (L) 12/12/2022 0725    PROT 6.0 08/14/2022 1435   ALBUMIN 3.0 (L) 12/12/2022 0725   ALBUMIN 3.9 08/14/2022 1435   AST 33 12/12/2022 0725   ALT 21 12/12/2022 0725   ALKPHOS 184 (H) 12/12/2022 0725   BILITOT 0.7 12/12/2022 0725   BILITOT 0.7 08/14/2022 1435   GFRNONAA >60 12/13/2022 1118   GFRNONAA 87 04/12/2013 1145   GFRAA 57 (L) 10/08/2020 1346   GFRAA >89 04/12/2013 1145      Latest Ref Rng & Units 12/12/2022    7:25 AM 12/09/2022    4:07 AM 12/08/2022    6:53 AM  Hepatic Function  Total Protein 6.5 - 8.1 g/dL 5.5  5.0  5.2   Albumin 3.5 - 5.0 g/dL 3.0  2.5  2.5   AST 15 - 41 U/L 33  29  38   ALT 0 - 44 U/L 21  59  74   Alk Phosphatase 38 - 126 U/L 184  227  264   Total Bilirubin 0.3 - 1.2 mg/dL 0.7  1.1  1.3       Current Medications:    Current Outpatient Medications (Cardiovascular):    metoprolol succinate (TOPROL-XL) 25 MG 24 hr tablet, Take 1 tablet (25 mg total) by mouth 2 (two) times daily. Needs appointment for future refills / 1st attempt   Current Outpatient Medications (Analgesics):    aspirin 81 MG tablet, Take 81 mg by mouth daily.   Current Outpatient Medications (Other):    escitalopram (LEXAPRO) 10 MG tablet, Take 2 tablets (20 mg total) by mouth daily.   pantoprazole (PROTONIX) 20 MG tablet, TAKE 1 TABLET BY MOUTH DAILY   pantoprazole (PROTONIX) 40 MG tablet, Take 1 tablet (40 mg total) by mouth daily.   polyethylene glycol (MIRALAX / GLYCOLAX) 17 g packet, Take 17 g by mouth 2 (two) times daily.   senna (SENOKOT) 8.6 MG TABS tablet, Take 1 tablet (8.6 mg total) by mouth daily.   tamsulosin (FLOMAX) 0.4 MG CAPS capsule, Take 1 capsule (0.4 mg total) by mouth daily.  Medical History:  Past Medical History:  Diagnosis Date   Anxiety    GERD (gastroesophageal reflux disease)    Hypertension    Vertigo    Allergies:  Allergies  Allergen Reactions   Crestor [Rosuvastatin] Other (See Comments)    Unknown per Pt   Lipitor [Atorvastatin] Other (See Comments)    Unknown  per Pt   Penicillins     REACTION: swelling/hives Has patient had a PCN reaction causing immediate rash, facial/tongue/throat swelling, SOB or lightheadedness with hypotension:yes Has patient had a PCN reaction causing severe rash involving mucus membranes or skin necrosis: Yes Has patient had a PCN reaction that required hospitalization No Has patient had a PCN reaction occurring within the last 10 years: No If all of the above answers are "NO", then may proceed with Cephalosporin use.    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 Family History:  His family history includes Healthy in his mother; Rheum arthritis in his daughter; Transient ischemic attack in his father.  REVIEW OF SYSTEMS  : All other systems reviewed and negative except where noted in the History of Present Illness.  PHYSICAL EXAM: There were no vitals taken for this visit. General Appearance: Well nourished, in no apparent distress. Head:   Normocephalic and atraumatic. Eyes:  sclerae anicteric,conjunctive pink  Respiratory: Respiratory effort normal, BS equal bilaterally without rales, rhonchi, wheezing. Cardio: RRR with no MRGs. Peripheral pulses intact.  Abdomen: Soft,  {BlankSingle:19197::"Flat","Obese","Non-distended"} ,active bowel sounds. {actendernessAB:27319} tenderness {anatomy; site abdomen:5010}. {BlankMultiple:19196::"Without guarding","With  guarding","Without rebound","With rebound"}. No masses. Rectal: {acrectalexam:27461} Musculoskeletal: Full ROM, {PSY - GAIT AND STATION:22860} gait. {With/Without:304960234} edema. Skin:  Dry and intact without significant lesions or rashes Neuro: Alert and  oriented x4;  No focal deficits. Psych:  Cooperative. Normal mood and affect.    Vladimir Crofts, PA-C 8:25 AM

## 2023-01-21 ENCOUNTER — Ambulatory Visit: Payer: Medicare Other | Admitting: Physician Assistant

## 2023-02-02 ENCOUNTER — Ambulatory Visit (INDEPENDENT_AMBULATORY_CARE_PROVIDER_SITE_OTHER): Payer: Medicare Other | Admitting: Nurse Practitioner

## 2023-02-02 ENCOUNTER — Encounter: Payer: Self-pay | Admitting: Nurse Practitioner

## 2023-02-02 VITALS — BP 102/53 | HR 62 | Temp 96.4°F | Resp 20 | Ht 70.8 in | Wt 177.6 lb

## 2023-02-02 DIAGNOSIS — R627 Adult failure to thrive: Secondary | ICD-10-CM

## 2023-02-02 DIAGNOSIS — Z09 Encounter for follow-up examination after completed treatment for conditions other than malignant neoplasm: Secondary | ICD-10-CM | POA: Diagnosis not present

## 2023-02-02 DIAGNOSIS — R6251 Failure to thrive (child): Secondary | ICD-10-CM

## 2023-02-02 DIAGNOSIS — F411 Generalized anxiety disorder: Secondary | ICD-10-CM

## 2023-02-02 MED ORDER — ALPRAZOLAM 1 MG PO TABS: 1.0000 mg | ORAL_TABLET | Freq: Two times a day (BID) | ORAL | 3 refills | Status: DC | PRN

## 2023-02-02 NOTE — Progress Notes (Signed)
Subjective:    Patient ID: Francisco [name], male    DOB: 05-17-1941, 82 y.o.   MRN: [id number]   Chief Complaint: rehab discharge  HPI Patient went  to the hospital on 12/07/22. Was admitted for failure to thrive. He was discharged to [facility] for rehab. He came home on 01/08/23. Since being home he has gradually improved. Still feels very weak. He only got 1 week of PT at [facility]. Since going home he has been doing exercises at home. He is walking with cane. No falls. There was change in medications. Patient Active Problem List   Diagnosis Date Noted   Protein-calorie malnutrition, severe 12/09/2022   Total bilirubin, elevated 12/09/2022   Weight loss 12/08/2022   UTI (urinary tract infection) 12/07/2022   Elevated liver function tests 12/07/2022   Failure to thrive in adult 12/07/2022   Acute kidney injury superimposed on chronic kidney disease ([place]) 12/07/2022   Dysphagia 12/07/2022   Dyslipidemia 12/07/2022   C4 cervical fracture ([place]) 08/18/2022   Bile leak, postoperative    Constipation    Normocytic anemia 07/06/2022   RUQ abdominal pain 07/05/2022   Acute calculous cholecystitis 07/05/2022   Lactic acidosis 05/06/2022   Thrombocytopenia ([place]) 05/06/2022   Chronic kidney disease, stage 3a ([place]) 05/06/2022   Acute cholecystitis with chronic cholecystitis 03/17/2022   Peripheral vascular disease, 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  Eyes:  Negative for pain.  Respiratory:  Negative for shortness of breath.   Cardiovascular:  Negative for chest pain,  palpitations and leg swelling.  Gastrointestinal:  Negative for abdominal pain.  Endocrine: Negative for polydipsia.  Skin:  Negative for rash.  Neurological:  Negative for dizziness, weakness and headaches.  Hematological:  Does not bruise/bleed easily.  All other systems reviewed and are negative.      Objective:   Physical Exam Vitals and nursing note reviewed.  Constitutional:      Appearance: Normal appearance. He is well-developed.  Neck:     Thyroid: No thyroid mass or thyromegaly.     Vascular: No carotid bruit or JVD.     Trachea: Phonation normal.  Cardiovascular:     Rate and Rhythm: Normal rate and regular rhythm.  Pulmonary:     Effort: Pulmonary effort is normal. No respiratory distress.     Breath sounds: Normal breath sounds.  Abdominal:     General: Bowel sounds are normal.     Palpations: Abdomen is soft.     Tenderness: There is no abdominal tenderness.  Musculoskeletal:        General: Normal range of motion.     Cervical back: Normal range of motion and neck supple.     Comments: Gait slow and steady- walking with cane  Lymphadenopathy:     Cervical: No cervical adenopathy.  Skin:    General: Skin is warm and dry.  Neurological:     Mental Status: He is alert and oriented to person, place, and time.  Psychiatric:        Behavior: Behavior normal.        Thought Content: Thought content normal.  Judgment: Judgment normal.    BP (!) 102/53   Pulse 62   Temp (!) 96.4 F (35.8 C) (Temporal)   Resp 20   Ht 5' 10.8" (1.798 m)   Wt 177 lb 9.6 oz (80.6 kg)   SpO2 99%   BMI 24.91 kg/m         Assessment & Plan:   [name] in today with chief complaint of Nursing home follow up   1. Failure to thrive (child) Eat 3 meals a day Drink water Continue  daily exercises  2. Hospital discharge follow-up Hospital records reviewed    The above assessment and management plan was discussed with the patient. The patient verbalized  understanding of and has agreed to the management plan. Patient is aware to call the clinic if symptoms persist or worsen. Patient is aware when to return to the clinic for a follow-up visit. Patient educated on when it is appropriate to go to the emergency department.   [name], FNP

## 2023-02-02 NOTE — Patient Instructions (Signed)
Fall Prevention in the Home, Adult Falls can cause injuries and can happen to people of all ages. There are many things you can do to make your home safer and to help prevent falls. What actions can I take to prevent falls? General information Use good lighting in all rooms. Make sure to: Replace any light bulbs that burn out. Turn on the lights in dark areas and use night-lights. Keep items that you use often in easy-to-reach places. Lower the shelves around your home if needed. Move furniture so that there are clear paths around it. Do not use throw rugs or other things on the floor that can make you trip. If any of your floors are uneven, fix them. Add color or contrast paint or tape to clearly mark and help you see: Grab bars or handrails. First and last steps of staircases. Where the edge of each step is. If you use a ladder or stepladder: Make sure that it is fully opened. Do not climb a closed ladder. Make sure the sides of the ladder are locked in place. Have someone hold the ladder while you use it. Know where your pets are as you move through your home. What can I do in the bathroom?     Keep the floor dry. Clean up any water on the floor right away. Remove soap buildup in the bathtub or shower. Buildup makes bathtubs and showers slippery. Use non-skid mats or decals on the floor of the bathtub or shower. Attach bath mats securely with double-sided, non-slip rug tape. If you need to sit down in the shower, use a non-slip stool. Install grab bars by the toilet and in the bathtub and shower. Do not use towel bars as grab bars. What can I do in the bedroom? Make sure that you have a light by your bed that is easy to reach. Do not use any sheets or blankets on your bed that hang to the floor. Have a firm chair or bench with side arms that you can use for support when you get dressed. What can I do in the kitchen? Clean up any spills right away. If you need to reach something  above you, use a step stool with a grab bar. Keep electrical cords out of the way. Do not use floor polish or wax that makes floors slippery. What can I do with my stairs? Do not leave anything on the stairs. Make sure that you have a light switch at the top and the bottom of the stairs. Make sure that there are handrails on both sides of the stairs. Fix handrails that are broken or loose. Install non-slip stair treads on all your stairs if they do not have carpet. Avoid having throw rugs at the top or bottom of the stairs. Choose a carpet that does not hide the edge of the steps on the stairs. Make sure that the carpet is firmly attached to the stairs. Fix carpet that is loose or worn. What can I do on the outside of my home? Use bright outdoor lighting. Fix the edges of walkways and driveways and fix any cracks. Clear paths of anything that can make you trip, such as tools or rocks. Add color or contrast paint or tape to clearly mark and help you see anything that might make you trip as you walk through a door, such as a raised step or threshold. Trim any bushes or trees on paths to your home. Check to see if handrails are loose   or broken and that both sides of all steps have handrails. Install guardrails along the edges of any raised decks and porches. Have leaves, snow, or ice cleared regularly. Use sand, salt, or ice melter on paths if you live where there is ice and snow during the winter. Clean up any spills in your garage right away. This includes grease or oil spills. What other actions can I take? Review your medicines with your doctor. Some medicines can cause dizziness or changes in blood pressure, which increase your risk of falling. Wear shoes that: Have a low heel. Do not wear high heels. Have rubber bottoms and are closed at the toe. Feel good on your feet and fit well. Use tools that help you move around if needed. These include: Canes. Walkers. Scooters. Crutches. Ask  your doctor what else you can do to help prevent falls. This may include seeing a physical therapist to learn to do exercises to move better and get stronger. Where to find more information Centers for Disease Control and Prevention, [employer]: cdc.gov National Institute on Aging: nia.nih.gov National Institute on Aging: nia.nih.gov Contact a doctor if: You are afraid of falling at home. You feel weak, drowsy, or dizzy at home. You fall at home. Get help right away if you: Lose consciousness or have trouble moving after a fall. Have a fall that causes a head injury. These symptoms may be an emergency. Get help right away. Call 911. Do not wait to see if the symptoms will go away. Do not drive yourself to the hospital. This information is not intended to replace advice given to you by your health care provider. Make sure you discuss any questions you have with your health care provider. Document Revised: 07/06/2022 Document Reviewed: 07/06/2022 Elsevier Patient Education  2023 Elsevier Inc.  

## 2023-02-02 NOTE — Addendum Note (Signed)
Addended by: [name] on: 02/02/2023 11:23 AM   Modules accepted: Orders

## 2023-02-04 ENCOUNTER — Other Ambulatory Visit: Payer: Self-pay | Admitting: Nurse Practitioner

## 2023-02-04 DIAGNOSIS — F411 Generalized anxiety disorder: Secondary | ICD-10-CM

## 2023-02-25 ENCOUNTER — Other Ambulatory Visit: Payer: Self-pay | Admitting: Nurse Practitioner

## 2023-02-25 DIAGNOSIS — I10 Essential (primary) hypertension: Secondary | ICD-10-CM

## 2023-03-20 ENCOUNTER — Other Ambulatory Visit: Payer: Self-pay | Admitting: Nurse Practitioner

## 2023-03-20 DIAGNOSIS — E782 Mixed hyperlipidemia: Secondary | ICD-10-CM

## 2023-03-23 ENCOUNTER — Other Ambulatory Visit: Payer: Self-pay | Admitting: Cardiology

## 2023-05-06 ENCOUNTER — Encounter: Payer: Self-pay | Admitting: Nurse Practitioner

## 2023-05-06 ENCOUNTER — Ambulatory Visit (INDEPENDENT_AMBULATORY_CARE_PROVIDER_SITE_OTHER): Payer: Medicare Other | Admitting: Nurse Practitioner

## 2023-05-06 VITALS — BP 106/63 | HR 45 | Temp 97.1°F | Resp 20 | Ht 70.0 in | Wt 182.0 lb

## 2023-05-06 DIAGNOSIS — I129 Hypertensive chronic kidney disease with stage 1 through stage 4 chronic kidney disease, or unspecified chronic kidney disease: Secondary | ICD-10-CM

## 2023-05-06 DIAGNOSIS — N1831 Chronic kidney disease, stage 3a: Secondary | ICD-10-CM | POA: Diagnosis not present

## 2023-05-06 DIAGNOSIS — E782 Mixed hyperlipidemia: Secondary | ICD-10-CM

## 2023-05-06 DIAGNOSIS — I10 Essential (primary) hypertension: Secondary | ICD-10-CM | POA: Diagnosis not present

## 2023-05-06 DIAGNOSIS — D696 Thrombocytopenia, unspecified: Secondary | ICD-10-CM

## 2023-05-06 DIAGNOSIS — K219 Gastro-esophageal reflux disease without esophagitis: Secondary | ICD-10-CM

## 2023-05-06 DIAGNOSIS — N400 Enlarged prostate without lower urinary tract symptoms: Secondary | ICD-10-CM

## 2023-05-06 DIAGNOSIS — F411 Generalized anxiety disorder: Secondary | ICD-10-CM

## 2023-05-06 DIAGNOSIS — I739 Peripheral vascular disease, unspecified: Secondary | ICD-10-CM | POA: Diagnosis not present

## 2023-05-06 DIAGNOSIS — K5904 Chronic idiopathic constipation: Secondary | ICD-10-CM | POA: Diagnosis not present

## 2023-05-06 DIAGNOSIS — D649 Anemia, unspecified: Secondary | ICD-10-CM

## 2023-05-06 DIAGNOSIS — R001 Bradycardia, unspecified: Secondary | ICD-10-CM | POA: Diagnosis not present

## 2023-05-06 MED ORDER — ESCITALOPRAM OXALATE 10 MG PO TABS: 20.0000 mg | ORAL_TABLET | Freq: Every day | ORAL | 1 refills | Status: DC

## 2023-05-06 MED ORDER — ALPRAZOLAM 1 MG PO TABS: 1.0000 mg | ORAL_TABLET | Freq: Two times a day (BID) | ORAL | 3 refills | Status: DC | PRN

## 2023-05-06 MED ORDER — PANTOPRAZOLE SODIUM 40 MG PO TBEC: 40.0000 mg | DELAYED_RELEASE_TABLET | Freq: Every day | ORAL | 0 refills | Status: DC

## 2023-05-06 NOTE — Addendum Note (Signed)
Addended by: Cleda Daub on: 05/06/2023 02:55 PM   Modules accepted: Orders

## 2023-05-06 NOTE — Patient Instructions (Signed)
Bradycardia, Adult Bradycardia is a slower-than-normal heartbeat. A normal resting heart rate for an adult ranges from 60 to 100 beats per minute. With bradycardia, the resting heart rate is less than 60 beats per minute. Bradycardia can prevent enough oxygen from reaching certain areas of your body when you are active. It can be serious if it keeps enough oxygen from reaching your brain and other parts of your body. Bradycardia is not a problem for everyone. For some healthy adults, a slow resting heart rate is normal. What are the causes? This condition may be caused by: A problem with the heart, including: A problem with the heart's electrical system, such as a heart block. With a heart block, electrical signals between the chambers of the heart are partially or completely blocked, so they are not able to work as they should. A problem with the heart's natural pacemaker (sinus node). Heart disease. A heart attack. Heart damage. Lyme disease. A heart infection. A heart condition that is present at birth (congenital heart defect). Certain medicines that treat heart conditions. Certain conditions, such as hypothyroidism and obstructive sleep apnea. Problems with the balance of chemicals and other substances, like potassium, in the blood. Trauma. Radiation therapy. What increases the risk? You are more likely to develop this condition if you: Are age 65 or older. Have high blood pressure (hypertension), high cholesterol (hyperlipidemia), or diabetes. Drink heavily, use tobacco or nicotine products, or use drugs. What are the signs or symptoms? Symptoms of this condition include: Light-headedness. Feeling faint or fainting. Fatigue and weakness. Trouble with activity or exercise. Shortness of breath. Chest pain (angina). Drowsiness. Confusion. Dizziness. How is this diagnosed? This condition may be diagnosed based on: Your symptoms. Your medical history. A physical exam. During  the exam, your health care provider will listen to your heartbeat and check your pulse. To confirm the diagnosis, your health care provider may order tests, such as: Blood tests. An electrocardiogram (ECG). This test records the heart's electrical activity. The test can show how fast your heart is beating and whether the heartbeat is steady. A test in which you wear a portable device (event recorder or Holter monitor) to record your heart's electrical activity while you go about your day. An exercise test. How is this treated? Treatment for this condition depends on the cause of the condition and how severe your symptoms are. Treatment may involve: Treatment of the underlying condition. Changing your medicines or how much medicine you take. Having a small, battery-operated device called a pacemaker implanted under the skin. When bradycardia occurs, this device can be used to increase your heart rate and help your heart beat in a regular rhythm. Follow these instructions at home: Lifestyle Manage any health conditions that contribute to bradycardia as told by your health care provider. Follow a heart-healthy diet. A nutrition specialist (dietitian) can help educate you about healthy food options and changes. Follow an exercise program that is approved by your health care provider. Maintain a healthy weight. Try to reduce or manage your stress, such as with yoga or meditation. If you need help reducing stress, ask your health care provider. Do not use any products that contain nicotine or tobacco. These products include cigarettes, chewing tobacco, and vaping devices, such as e-cigarettes. If you need help quitting, ask your health care provider. Do not use illegal drugs. Alcohol use If you drink alcohol: Limit how much you have to: 0-1 drink a day for women who are not pregnant. 0-2 drinks a day   for men. Know how much alcohol is in a drink. In the [place] one drink equals one 12 oz bottle of  beer (355 mL), one 5 oz glass of wine (148 mL), or one 1 oz glass of hard liquor (44 mL). General instructions Take over-the-counter and prescription medicines only as told by your health care provider. Keep all follow-up visits. This is important. How is this prevented? In some cases, bradycardia may be prevented by: Treating underlying medical problems. Stopping behaviors or medicines that can trigger the condition. Contact a health care provider if: You feel light-headed or dizzy. You almost faint. You feel weak or are easily fatigued during physical activity. You experience confusion or have memory problems. Get help right away if: You faint. You have chest pains or an irregular heartbeat (palpitations). You have trouble breathing. These symptoms may represent a serious problem that is an emergency. Do not wait to see if the symptoms will go away. Get medical help right away. Call your local emergency services (911 in the [place] Do not drive yourself to the hospital. Summary Bradycardia is a slower-than-normal heartbeat. With bradycardia, the resting heart rate is less than 60 beats per minute. Treatment for this condition depends on the cause. Manage any health conditions that contribute to bradycardia as told by your health care provider. Do not use any products that contain nicotine or tobacco. These products include cigarettes, chewing tobacco, and vaping devices, such as e-cigarettes. Keep all follow-up visits. This is important. This information is not intended to replace advice given to you by your health care provider. Make sure you discuss any questions you have with your health care provider. Document Revised: 02/23/2021 Document Reviewed: 02/23/2021 Elsevier Patient Education  2024 Elsevier Inc.  

## 2023-05-06 NOTE — Progress Notes (Signed)
Subjective:    Patient ID: Francisco [name], male    DOB: August 25, 1941, 82 y.o.   MRN: [id number]   Chief Complaint: medical management of chronic issues     HPI:  Francisco [name] is a 82 y.o. who identifies as a male who was assigned male at birth.   Social history: Lives with: by himself Work history: retired   Water engineer in today for follow up of the following chronic medical issues:  1. Essential hypertension No c/o chest pain, sob or headache. Doe snot check blood pressure at home. BP Readings from Last 3 Encounters:  02/02/23 (!) 102/53  12/14/22 (!) 125/58  08/19/22 (!) 155/73     2. Peripheral vascular disease, unspecified (HCC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 Has not seen cardiology in over year.   3. GERD without esophagitis Is on protonix and is doing well.  4. Benign prostatic hyperplasia, unspecified whether lower urinary tract symptoms present No voiding issues  5. Chronic kidney disease, stage 3a [facility]) Lab Results  Component Value Date   CREATININE 1.21 12/13/2022     6. Thrombocytopenia (HCC) Lab Results  Component Value Date   PLT 221 12/12/2022     7. Mixed hyperlipidemia Has had a very poor appetite lately. Is not able to do much exercise due to weakness Lab Results  Component Value Date   CHOL 133 08/14/2022   HDL 41 08/14/2022   LDLCALC 63 08/14/2022   TRIG 171 (H) 08/14/2022   CHOLHDL 3.2 08/14/2022     8. Normocytic anemia Lab Results  Component Value Date   HGB 12.1 (L) 12/12/2022     9. Chronic idiopathic constipation No longer having constipation since taking miralax daily   New complaints: None today  Allergies  Allergen Reactions   Crestor [Rosuvastatin] Other (See Comments)    Unknown per Pt   Lipitor [Atorvastatin] Other (See Comments)    Unknown per Pt   Penicillins     REACTION: swelling/hives Has patient had a PCN reaction causing immediate rash, facial/tongue/throat swelling, SOB or lightheadedness with  hypotension:yes Has patient had a PCN reaction causing severe rash involving mucus membranes or skin necrosis: Yes Has patient had a PCN reaction that required hospitalization No Has patient had a PCN reaction occurring within the last 10 years: No If all of the above answers are "NO", then may proceed with Cephalosporin use.    Symbicort [Budesonide-Formoterol Fumarate] Other (See Comments)    Pain Behind ribs Unknown per Pt   Outpatient Encounter Medications as of 05/06/2023  Medication Sig   ALPRAZolam (XANAX) 1 MG tablet Take 1 tablet (1 mg total) by mouth 2 (two) times daily as needed for sleep or anxiety.   aspirin 81 MG tablet Take 81 mg by mouth daily.   escitalopram (LEXAPRO) 10 MG tablet TAKE 2 TABLETS BY MOUTH DAILY   metoprolol succinate (TOPROL-XL) 25 MG 24 hr tablet Take 1 tablet (25 mg total) by mouth 2 (two) times daily. Patient needs an appointment for further refills. 2 nd attempt   pantoprazole (PROTONIX) 20 MG tablet TAKE 1 TABLET BY MOUTH DAILY   pantoprazole (PROTONIX) 40 MG tablet Take 1 tablet (40 mg total) by mouth daily.   polyethylene glycol (MIRALAX / GLYCOLAX) 17 g packet Take 17 g by mouth 2 (two) times daily.   senna (SENOKOT) 8.6 MG TABS tablet Take 1 tablet (8.6 mg total) by mouth daily.   tamsulosin (FLOMAX) 0.4 MG CAPS capsule Take 1 capsule (0.4 mg total) by mouth  daily.   No facility-administered encounter medications on file as of 05/06/2023.    Past Surgical History:  Procedure Laterality Date   AMPUTATION Left 08/05/2016   Procedure: REVISION AMPUTATION LEFT INDEX FINGER REPAIR LACERATION LEFT THUMB;  Surgeon: Bradly Bienenstock, MD;  Location: [facility] OR;  Service: Orthopedics;  Laterality: Left;   BILIARY STENT PLACEMENT N/A 07/09/2022   Procedure: BILIARY STENT PLACEMENT;  Surgeon: Meryl Dare, MD;  Location: [facility] ENDOSCOPY;  Service: Gastroenterology;  Laterality: N/A;   CHOLECYSTECTOMY N/A 07/05/2022   Procedure: LAPAROSCOPIC CHOLECYSTECTOMY;  Surgeon:  Fritzi Mandes, MD;  Location: [facility] ORS;  Service: General;  Laterality: N/A;   ENDOSCOPIC RETROGRADE CHOLANGIOPANCREATOGRAPHY (ERCP) WITH PROPOFOL N/A 12/08/2022   Procedure: ENDOSCOPIC RETROGRADE CHOLANGIOPANCREATOGRAPHY (ERCP) WITH PROPOFOL;  Surgeon: Meryl Dare, MD;  Location: [facility] ENDOSCOPY;  Service: Gastroenterology;  Laterality: N/A;   ERCP N/A 07/09/2022   Procedure: ENDOSCOPIC RETROGRADE CHOLANGIOPANCREATOGRAPHY (ERCP);  Surgeon: Meryl Dare, MD;  Location: [facility] ENDOSCOPY;  Service: Gastroenterology;  Laterality: N/A;   ESOPHAGOGASTRODUODENOSCOPY (EGD) WITH PROPOFOL N/A 12/08/2022   Procedure: ESOPHAGOGASTRODUODENOSCOPY (EGD) WITH PROPOFOL;  Surgeon: Napoleon Form, MD;  Location: [facility] ENDOSCOPY;  Service: Gastroenterology;  Laterality: N/A;   POSTERIOR CERVICAL FUSION/FORAMINOTOMY N/A 08/18/2022   Procedure: C4-5 Posterior cervical fusion with lateral mass fixation;  Surgeon: Coletta Memos, MD;  Location: [facility] OR;  Service: Neurosurgery;  Laterality: N/A;   REMOVAL OF STONES  12/08/2022   Procedure: REMOVAL OF STONES;  Surgeon: Meryl Dare, MD;  Location: [facility] ENDOSCOPY;  Service: Gastroenterology;;   Dennison Mascot  07/09/2022   Procedure: SPHINCTEROTOMY;  Surgeon: Meryl Dare, MD;  Location: [facility] ENDOSCOPY;  Service: Gastroenterology;;   SPINE SURGERY     STENT REMOVAL  12/08/2022   Procedure: STENT REMOVAL;  Surgeon: Meryl Dare, MD;  Location: [facility] ENDOSCOPY;  Service: Gastroenterology;;    Family History  Problem Relation Age of Onset   Healthy Mother    Transient ischemic attack Father    Rheum arthritis Daughter    Amblyopia Neg Hx    Blindness Neg Hx    Cataracts Neg Hx    Glaucoma Neg Hx    Macular degeneration Neg Hx    Retinal detachment Neg Hx    Strabismus Neg Hx    Retinitis pigmentosa Neg Hx       Controlled substance contract: 08/18/22     Review of Systems  Constitutional:  Negative for diaphoresis.  Eyes:  Negative for pain.   Respiratory:  Negative for shortness of breath.   Cardiovascular:  Negative for chest pain, palpitations and leg swelling.  Gastrointestinal:  Negative for abdominal pain.  Endocrine: Negative for polydipsia.  Skin:  Negative for rash.  Neurological:  Negative for dizziness, weakness and headaches.  Hematological:  Does not bruise/bleed easily.  All other systems reviewed and are negative.      Objective:   Physical Exam Vitals and nursing note reviewed.  Constitutional:      Appearance: Normal appearance. He is well-developed.  HENT:     Head: Normocephalic.     Nose: Nose normal.     Mouth/Throat:     Mouth: Mucous membranes are moist.     Pharynx: Oropharynx is clear.  Eyes:     Pupils: Pupils are equal, round, and reactive to light.  Neck:     Thyroid: No thyroid mass or thyromegaly.     Vascular: No carotid bruit or JVD.     Trachea: Phonation normal.  Cardiovascular:  Rate and Rhythm: Regular rhythm. Bradycardia present.  Pulmonary:     Effort: Pulmonary effort is normal. No respiratory distress.     Breath sounds: Normal breath sounds.  Abdominal:     General: Bowel sounds are normal.     Palpations: Abdomen is soft.     Tenderness: There is no abdominal tenderness.  Musculoskeletal:        General: Normal range of motion.     Cervical back: Normal range of motion and neck supple.     Comments: Ambulating with cane  Lymphadenopathy:     Cervical: No cervical adenopathy.  Skin:    General: Skin is warm and dry.  Neurological:     Mental Status: He is alert and oriented to person, place, and time.  Psychiatric:        Behavior: Behavior normal.        Thought Content: Thought content normal.        Judgment: Judgment normal.     EKG- bradycardia-Mary-Margaret Daphine Deutscher, FNP   BP 106/63   Pulse (!) 45   Temp (!) 97.1 F (36.2 C) (Temporal)   Resp 20   Ht 5\' 10"  (1.778 m)   Wt 182 lb (82.6 kg)   SpO2 98%   BMI 26.11 kg/m      Assessment &  Plan:  Francisco [name] comes in today with chief complaint of Medical Management of Chronic Issues   Diagnosis and orders addressed:  1. Essential hypertension Low sodium diet  2. Peripheral vascular disease, unspecified (HCC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  3. GERD without esophagitis Avoid spicy foods Do not eat 2 hours prior to bedtime - pantoprazole (PROTONIX) 40 MG tablet; Take 1 tablet (40 mg total) by mouth daily.  Dispense: 30 tablet; Refill: 0  4. Benign prostatic hyperplasia, unspecified whether lower urinary tract symptoms present Report any voiding issues  5. Chronic kidney disease, stage 3a (HCC) Labs pending  6. Thrombocytopenia (HCC) Labs pending  7. Mixed hyperlipidemia Low fat diet  8. Normocytic anemia Labs pending  9. Chronic idiopathic constipation Improved with miralax  10. Bradycardia Stat referral to cardiology Hold metoprolol for now - Ambulatory referral to Cardiology - EKG 12-Lead  11. GAD (generalized anxiety disorder) Stress management - ALPRAZolam (XANAX) 1 MG tablet; Take 1 tablet (1 mg total) by mouth 2 (two) times daily as needed for sleep or anxiety.  Dispense: 60 tablet; Refill: 3 - escitalopram (LEXAPRO) 10 MG tablet; Take 2 tablets (20 mg total) by mouth daily.  Dispense: 180 tablet; Refill: 1   Labs pending Health Maintenance reviewed Diet and exercise encouraged  Follow up plan: 3 months   Mary-Margaret Daphine Deutscher, FNP

## 2023-05-07 LAB — LIPID PANEL
Chol/HDL Ratio: 3.4 ratio (ref 0.0–5.0)
Cholesterol, Total: 156 mg/dL (ref 100–199)
HDL: 46 mg/dL (ref >39)
LDL Chol Calc (NIH): 82 mg/dL (ref 0–99)
Triglycerides: 165 mg/dL — ABNORMAL HIGH (ref 0–149)
VLDL Cholesterol Cal: 28 mg/dL (ref 5–40)

## 2023-05-07 LAB — CMP14+EGFR
ALT: 10 IU/L (ref 0–44)
AST: 17 IU/L (ref 0–40)
Albumin: 4.5 g/dL (ref 3.7–4.7)
Alkaline Phosphatase: 69 IU/L (ref 44–121)
BUN/Creatinine Ratio: 16 (ref 10–24)
BUN: 28 mg/dL — ABNORMAL HIGH (ref 8–27)
Bilirubin Total: 0.8 mg/dL (ref 0.0–1.2)
CO2: 25 mmol/L (ref 20–29)
Calcium: 9.2 mg/dL (ref 8.6–10.2)
Chloride: 101 mmol/L (ref 96–106)
Creatinine, Ser: 1.75 mg/dL — ABNORMAL HIGH (ref 0.76–1.27)
Globulin, Total: 2 g/dL (ref 1.5–4.5)
Glucose: 107 mg/dL — ABNORMAL HIGH (ref 70–99)
Potassium: 4.7 mmol/L (ref 3.5–5.2)
Sodium: 139 mmol/L (ref 134–144)
Total Protein: 6.5 g/dL (ref 6.0–8.5)
eGFR: 38 mL/min/{1.73_m2} — ABNORMAL LOW (ref >59)

## 2023-05-07 LAB — PSA, TOTAL AND FREE
PSA, Free Pct: 38.6 %
PSA, Free: 0.54 ng/mL
Prostate Specific Ag, Serum: 1.4 ng/mL (ref 0.0–4.0)

## 2023-05-07 LAB — CBC WITH DIFFERENTIAL/PLATELET
Basophils Absolute: 0 10*3/uL (ref 0.0–0.2)
Basos: 1 %
EOS (ABSOLUTE): 0.2 10*3/uL (ref 0.0–0.4)
Eos: 3 %
Hematocrit: 40.9 % (ref 37.5–51.0)
Hemoglobin: 14.2 g/dL (ref 13.0–17.7)
Immature Grans (Abs): 0 10*3/uL (ref 0.0–0.1)
Immature Granulocytes: 0 %
Lymphocytes Absolute: 1.7 10*3/uL (ref 0.7–3.1)
Lymphs: 26 %
MCH: 32.4 pg (ref 26.6–33.0)
MCHC: 34.7 g/dL (ref 31.5–35.7)
MCV: 93 fL (ref 79–97)
Monocytes Absolute: 0.4 10*3/uL (ref 0.1–0.9)
Monocytes: 7 %
Neutrophils Absolute: 4 10*3/uL (ref 1.4–7.0)
Neutrophils: 63 %
Platelets: 163 10*3/uL (ref 150–450)
RBC: 4.38 x10E6/uL (ref 4.14–5.80)
RDW: 13.1 % (ref 11.6–15.4)
WBC: 6.4 10*3/uL (ref 3.4–10.8)

## 2023-05-23 NOTE — Progress Notes (Unsigned)
Cardiology Office Note:  .   Date:  05/24/2023  ID:  Francisco [name], DOB [birth year], MRN [id number] PCP: Bennie Pierini, FNP  [facility] Providers Cardiologist:  None {  History of Present Illness: .   Francisco [name] is a 82 y.o. 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  Was referred to cardiology originally because he was having some atypical chest pain that was accompanied by numbness in his arm but typically happens when he sleeps and not likely related to the heart.  History includes 2 neck surgeries and this was a suspected reason for his arm pain however he also was having some chest tightness which happened when he walked around.  He walks rather slowly when he goes shopping in [place] and when he moves very slowly and then moves faster he gets this sensation in his chest.  Accompanied by having some shortness of breath.  No sweating sensation.  Last few minutes.  Pain is graded 3 out of 10 on the pain scale.  No regular exercise.  Not on any special diets.  He does have a family history of coronary disease but none that is premature.  Today, he tells me that he does have some dizziness but was diagnosed with vertigo.  He tires out easily.  This all started when he got sepsis and then ended up having his gallbladder removed as well as a kidney infection.  He then had neck surgery.  It seems like it has been 1 thing after the other with his health.  He had a terrible time in the hospital and was upset about the nursing care.  He has had some low heart rates lately and we discussed ordering a monitor today.  Reports no shortness of breath nor dyspnea on exertion. Reports no chest pain,  pressure, or tightness. No edema, orthopnea, PND. Reports no palpitations.  ROS: Pertinent ROS in HPI  Studies Reviewed: .       Echocardiogram 12/17/2021 IMPRESSIONS     1. Left ventricular ejection fraction, by estimation, is 60 to 65%. The  left ventricle has normal function. The left ventricle has no regional  wall motion abnormalities. Left ventricular diastolic parameters are  consistent with Grade I diastolic  dysfunction (impaired relaxation).   2. Right ventricular systolic function is normal. The right ventricular  size is normal.   3. The mitral valve is normal in structure. No evidence of mitral valve  regurgitation. No evidence of mitral stenosis.   4. The aortic valve is normal in structure. Aortic valve regurgitation is  not visualized. No aortic stenosis is present.   5. The inferior vena cava is normal in size with greater than 50%  respiratory variability, suggesting right atrial pressure of 3 mmHg.   FINDINGS   Left Ventricle: Left ventricular ejection fraction, by estimation, is 60  to 65%. The left ventricle has normal function. The left ventricle has no  regional wall motion abnormalities. The left ventricular internal cavity  size was normal in size. There is   borderline left ventricular hypertrophy. Left ventricular diastolic  parameters are consistent with Grade I diastolic dysfunction (impaired  relaxation).   Right Ventricle: The right ventricular size is normal. No increase in  right ventricular  wall thickness. Right ventricular systolic function is  normal.   Left Atrium: Left atrial size was normal in size.   Right Atrium: Right atrial size was normal in size.   Pericardium: There is no evidence of pericardial effusion.   Mitral Valve: The mitral valve is normal in structure. No evidence of  mitral valve regurgitation. No evidence of mitral valve stenosis.   Tricuspid Valve: The tricuspid valve is normal in structure. Tricuspid  valve  regurgitation is not demonstrated. No evidence of tricuspid  stenosis.   Aortic Valve: The aortic valve is normal in structure. Aortic valve  regurgitation is not visualized. No aortic stenosis is present. Aortic  valve mean gradient measures 4.0 mmHg. Aortic valve peak gradient measures  8.4 mmHg. Aortic valve area, by VTI  measures 3.07 cm.   Pulmonic Valve: The pulmonic valve was normal in structure. Pulmonic valve  regurgitation is not visualized. No evidence of pulmonic stenosis.   Aorta: The aortic root is normal in size and structure.   Venous: The inferior vena cava is normal in size with greater than 50%  respiratory variability, suggesting right atrial pressure of 3 mmHg.   IAS/Shunts: No atrial level shunt detected by color flow Doppler.          Physical Exam:   VS:  BP (!) 110/56   Pulse (!) 59   Ht 5\' 10"  (1.778 m)   Wt 183 lb (83 kg)   SpO2 99%   BMI 26.26 kg/m    Wt Readings from Last 3 Encounters:  05/24/23 183 lb (83 kg)  05/06/23 182 lb (82.6 kg)  02/02/23 177 lb 9.6 oz (80.6 kg)    GEN: Well nourished, well developed in no acute distress NECK: No JVD; No carotid bruits CARDIAC: RRR, no murmurs, rubs, gallops RESPIRATORY:  Clear to auscultation without rales, wheezing or rhonchi  ABDOMEN: Soft, non-tender, non-distended EXTREMITIES:  No edema; No deformity   ASSESSMENT AND PLAN: .   1.  Bradycardia -We have ordered a 14-day ZIO monitor to assess for heart block -Metoprolol succinate was discontinued -Patient is having some occasional dizziness  2. Primary hypertension -Blood pressure well-controlled today, 110/56 -Metoprolol succinate was stopped due to bradycardia  3.  Peripheral vascular disease/carotid artery disease -Recent vascular ultrasound (carotid) reviewed 2/23 -no LE pain   4.  Chest pain of uncertain etiology -no longer having  chest pain  5.  Mixed hyperlipidemia   -Most recent LDL 82, HDL 46, total cholesterol 782,  triglycerides 165 -Not currently on any cholesterol medicines   Dispo: He can follow-up in 3 to 4 months with Dr. Bing Matter  Signed, Sharlene Dory, PA-C

## 2023-05-24 ENCOUNTER — Ambulatory Visit (INDEPENDENT_AMBULATORY_CARE_PROVIDER_SITE_OTHER): Payer: Medicare Other

## 2023-05-24 ENCOUNTER — Encounter: Payer: Self-pay | Admitting: Physician Assistant

## 2023-05-24 ENCOUNTER — Ambulatory Visit: Payer: Medicare Other | Attending: Physician Assistant | Admitting: Physician Assistant

## 2023-05-24 ENCOUNTER — Other Ambulatory Visit: Payer: Self-pay | Admitting: Nurse Practitioner

## 2023-05-24 VITALS — BP 110/56 | HR 59 | Ht 70.0 in | Wt 183.0 lb

## 2023-05-24 DIAGNOSIS — I10 Essential (primary) hypertension: Secondary | ICD-10-CM | POA: Diagnosis not present

## 2023-05-24 DIAGNOSIS — I739 Peripheral vascular disease, unspecified: Secondary | ICD-10-CM

## 2023-05-24 DIAGNOSIS — E782 Mixed hyperlipidemia: Secondary | ICD-10-CM

## 2023-05-24 DIAGNOSIS — R001 Bradycardia, unspecified: Secondary | ICD-10-CM | POA: Diagnosis not present

## 2023-05-24 DIAGNOSIS — R079 Chest pain, unspecified: Secondary | ICD-10-CM | POA: Diagnosis not present

## 2023-05-24 DIAGNOSIS — R42 Dizziness and giddiness: Secondary | ICD-10-CM

## 2023-05-24 NOTE — Progress Notes (Unsigned)
ZIO XT serial # [id number] from office inventory applied to patient.  Dr. Bing Matter to read.

## 2023-05-24 NOTE — Patient Instructions (Addendum)
Medication Instructions:  Your physician recommends that you continue on your current medications as directed. Please refer to the Current Medication list given to you today.  *If you need a refill on your cardiac medications before your next appointment, please call your pharmacy*  Lab Work: None If you have labs (blood work) drawn today and your tests are completely normal, you will receive your results only by: MyChart Message (if you have MyChart) OR A paper copy in the mail If you have any lab test that is abnormal or we need to change your treatment, we will call you to review the results.  Testing/Procedures: Christena Deem- Long Term Monitor Instructions  Your physician has requested you wear a ZIO patch monitor for 14 days.  This is a single patch monitor. [employer] supplies one patch monitor per enrollment. Additional stickers are not available. Please do not apply patch if you will be having a Nuclear Stress Test,  Echocardiogram, Cardiac CT, MRI, or Chest Xray during the period you would be wearing the  monitor. The patch cannot be worn during these tests. You cannot remove and re-apply the  ZIO XT patch monitor.  Your ZIO patch monitor will be mailed 3 day USPS to your address on file. It may take 3-5 days  to receive your monitor after you have been enrolled.  Once you have received your monitor, please review the enclosed instructions. Your monitor  has already been registered assigning a specific monitor serial # to you.  Billing and Patient Assistance Program Information  We have supplied [employer] with any of your insurance information on file for billing purposes. [employer] offers a sliding scale Patient Assistance Program for patients that do not have  insurance, or whose insurance does not completely cover the cost of the ZIO monitor.  You must apply for the Patient Assistance Program to qualify for this discounted rate.  To apply, please call [employer] at [phone], select  option 4, select option 2, ask to apply for  Patient Assistance Program. Meredeth Ide will ask your household income, and how many people  are in your household. They will quote your out-of-pocket cost based on that information.  [employer] will also be able to set up a 48-month, interest-free payment plan if needed.  Applying the monitor   Shave hair from upper left chest.  Hold abrader disc by orange tab. Rub abrader in 40 strokes over the upper left chest as  indicated in your monitor instructions.  Clean area with 4 enclosed alcohol pads. Let dry.  Apply patch as indicated in monitor instructions. Patch will be placed under collarbone on left  side of chest with arrow pointing upward.  Rub patch adhesive wings for 2 minutes. Remove white label marked "1". Remove the white  label marked "2". Rub patch adhesive wings for 2 additional minutes.  While looking in a mirror, press and release button in center of patch. A small green light will  flash 3-4 times. This will be your only indicator that the monitor has been turned on.  Do not shower for the first 24 hours. You may shower after the first 24 hours.  Press the button if you feel a symptom. You will hear a small click. Record Date, Time and  Symptom in the Patient Logbook.  When you are ready to remove the patch, follow instructions on the last 2 pages of Patient  Logbook. Stick patch monitor onto the last page of Patient Logbook.  Place Patient Logbook in the blue  and white box. Use locking tab on box and tape box closed  securely. The blue and white box has prepaid postage on it. Please place it in the mailbox as  soon as possible. Your physician should have your test results approximately 7 days after the  monitor has been mailed back to [facility].  Call [facility] Customer Care at [phone] if you have questions regarding  your ZIO XT patch monitor. Call them immediately if you see an orange light blinking on your  monitor.   If your monitor falls off in less than 4 days, contact our Monitor department at [phone].  If your monitor becomes loose or falls off after 4 days call [employer] at [phone] for  suggestions on securing your monitor   Follow-Up: At [facility], you and your health needs are our priority.  As part of our continuing mission to provide you with exceptional heart care, we have created designated Provider Care Teams.  These Care Teams include your primary Cardiologist (physician) and Advanced Practice Providers (APPs -  Physician Assistants and Nurse Practitioners) who all work together to provide you with the care you need, when you need it.  We recommend signing up for the patient portal called "MyChart".  Sign up information is provided on this After Visit Summary.  MyChart is used to connect with patients for Virtual Visits (Telemedicine).  Patients are able to view lab/test results, encounter notes, upcoming appointments, etc.  Non-urgent messages can be sent to your provider as well.   To learn more about what you can do with MyChart, go to [email or web address].    Your next appointment:   3-4 month(s)  Provider:   Gypsy Balsam, MD   Low-Sodium Eating Plan Salt (sodium) helps you keep a healthy balance of fluids in your body. Too much sodium can raise your blood pressure. It can also cause fluid and waste to be held in your body. Your health care provider or dietitian may recommend a low-sodium eating plan if you have high blood pressure (hypertension), kidney disease, liver disease, or heart failure. Eating less sodium can help lower your blood pressure and reduce swelling. It can also protect your heart, liver, and kidneys. What are tips for following this plan? Reading food labels  Check food labels for the amount of sodium per serving. If you eat more than one serving, you must multiply the listed amount by the number of servings. Choose foods with less than  140 milligrams (mg) of sodium per serving. Avoid foods with 300 mg of sodium or more per serving. Always check how much sodium is in a product, even if the label says "unsalted" or "no salt added." Shopping  Buy products labeled as "low-sodium" or "no salt added." Buy fresh foods. Avoid canned foods and pre-made or frozen meals. Avoid canned, cured, or processed meats. Buy breads that have less than 80 mg of sodium per slice. Cooking  Eat more home-cooked food. Try to eat less restaurant, buffet, and fast food. Try not to add salt when you cook. Use salt-free seasonings or herbs instead of table salt or sea salt. Check with your provider or pharmacist before using salt substitutes. Cook with plant-based oils, such as canola, sunflower, or olive oil. Meal planning When eating at a restaurant, ask if your food can be made with less salt or no salt. Avoid dishes labeled as brined, pickled, cured, or smoked. Avoid dishes made with soy sauce, miso, or teriyaki sauce. Avoid foods that have monosodium  glutamate (MSG) in them. MSG may be added to some restaurant food, sauces, soups, bouillon, and canned foods. Make meals that can be grilled, baked, poached, roasted, or steamed. These are often made with less sodium. General information Try to limit your sodium intake to 1,500-2,300 mg each day, or the amount told by your provider. What foods should I eat? Fruits Fresh, frozen, or canned fruit. Fruit juice. Vegetables Fresh or frozen vegetables. "No salt added" canned vegetables. "No salt added" tomato sauce and paste. Low-sodium or reduced-sodium tomato and vegetable juice. Grains Low-sodium cereals, such as oats, puffed wheat and rice, and shredded wheat. Low-sodium crackers. Unsalted rice. Unsalted pasta. Low-sodium bread. Whole grain breads and whole grain pasta. Meats and other proteins Fresh or frozen meat, poultry, seafood, and fish. These should have no added salt. Low-sodium canned tuna  and salmon. Unsalted nuts. Dried peas, beans, and lentils without added salt. Unsalted canned beans. Eggs. Unsalted nut butters. Dairy Milk. Soy milk. Cheese that is naturally low in sodium, such as ricotta cheese, fresh mozzarella, or Swiss cheese. Low-sodium or reduced-sodium cheese. Cream cheese. Yogurt. Seasonings and condiments Fresh and dried herbs and spices. Salt-free seasonings. Low-sodium mustard and ketchup. Sodium-free salad dressing. Sodium-free light mayonnaise. Fresh or refrigerated horseradish. Lemon juice. Vinegar. Other foods Homemade, reduced-sodium, or low-sodium soups. Unsalted popcorn and pretzels. Low-salt or salt-free chips. The items listed above may not be all the foods and drinks you can have. Talk to a dietitian to learn more. What foods should I avoid? Vegetables Sauerkraut, pickled vegetables, and relishes. Olives. [place] fries. Onion rings. Regular canned vegetables, except low-sodium or reduced-sodium items. Regular canned tomato sauce and paste. Regular tomato and vegetable juice. Frozen vegetables in sauces. Grains Instant hot cereals. Bread stuffing, pancake, and biscuit mixes. Croutons. Seasoned rice or pasta mixes. Noodle soup cups. Boxed or frozen macaroni and cheese. Regular salted crackers. Self-rising flour. Meats and other proteins Meat or fish that is salted, canned, smoked, spiced, or pickled. Precooked or cured meat, such as sausages or meat loaves. Tomasa Blase. Ham. Pepperoni. Hot dogs. Corned beef. Chipped beef. Salt pork. Jerky. Pickled herring, anchovies, and sardines. Regular canned tuna. Salted nuts. Dairy Processed cheese and cheese spreads. Hard cheeses. Cheese curds. Blue cheese. Feta cheese. String cheese. Regular cottage cheese. Buttermilk. Canned milk. Fats and oils Salted butter. Regular margarine. Ghee. Bacon fat. Seasonings and condiments Onion salt, garlic salt, seasoned salt, table salt, and sea salt. Canned and packaged gravies.  Worcestershire sauce. Tartar sauce. Barbecue sauce. Teriyaki sauce. Soy sauce, including reduced-sodium soy sauce. Steak sauce. Fish sauce. Oyster sauce. Cocktail sauce. Horseradish that you find on the shelf. Regular ketchup and mustard. Meat flavorings and tenderizers. Bouillon cubes. Hot sauce. Pre-made or packaged marinades. Pre-made or packaged taco seasonings. Relishes. Regular salad dressings. Salsa. Other foods Salted popcorn and pretzels. Corn chips and puffs. Potato and tortilla chips. Canned or dried soups. Pizza. Frozen entrees and pot pies. The items listed above may not be all the foods and drinks you should avoid. Talk to a dietitian to learn more. This information is not intended to replace advice given to you by your health care provider. Make sure you discuss any questions you have with your health care provider. Document Revised: 11/19/2022 Document Reviewed: 11/19/2022 Elsevier Patient Education  2024 [employer]  Heart-Healthy Eating Plan Many factors influence your heart health, including eating and exercise habits. Heart health is also called coronary health. Coronary risk increases with abnormal blood fat (lipid) levels. A heart-healthy eating plan includes  limiting unhealthy fats, increasing healthy fats, limiting salt (sodium) intake, and making other diet and lifestyle changes. What is my plan? Your health care provider may recommend that: You limit your fat intake to _________% or less of your total calories each day. You limit your saturated fat intake to _________% or less of your total calories each day. You limit the amount of cholesterol in your diet to less than _________ mg per day. You limit the amount of sodium in your diet to less than _________ mg per day. What are tips for following this plan? Cooking Cook foods using methods other than frying. Baking, boiling, grilling, and broiling are all good options. Other ways to reduce fat include: Removing the  skin from poultry. Removing all visible fats from meats. Steaming vegetables in water or broth. Meal planning  At meals, imagine dividing your plate into fourths: Fill one-half of your plate with vegetables and green salads. Fill one-fourth of your plate with whole grains. Fill one-fourth of your plate with lean protein foods. Eat 2-4 cups of vegetables per day. One cup of vegetables equals 1 cup (91 g) broccoli or cauliflower florets, 2 medium carrots, 1 large bell pepper, 1 large sweet potato, 1 large tomato, 1 medium white potato, 2 cups (150 g) raw leafy greens. Eat 1-2 cups of fruit per day. One cup of fruit equals 1 small apple, 1 large banana, 1 cup (237 g) mixed fruit, 1 large orange,  cup (82 g) dried fruit, 1 cup (240 mL) 100% fruit juice. Eat more foods that contain soluble fiber. Examples include apples, broccoli, carrots, beans, peas, and barley. Aim to get 25-30 g of fiber per day. Increase your consumption of legumes, nuts, and seeds to 4-5 servings per week. One serving of dried beans or legumes equals  cup (90 g) cooked, 1 serving of nuts is  oz (12 almonds, 24 pistachios, or 7 walnut halves), and 1 serving of seeds equals  oz (8 g). Fats Choose healthy fats more often. Choose monounsaturated and polyunsaturated fats, such as olive and canola oils, avocado oil, flaxseeds, walnuts, almonds, and seeds. Eat more omega-3 fats. Choose salmon, mackerel, sardines, tuna, flaxseed oil, and ground flaxseeds. Aim to eat fish at least 2 times each week. Check food labels carefully to identify foods with trans fats or high amounts of saturated fat. Limit saturated fats. These are found in animal products, such as meats, butter, and cream. Plant sources of saturated fats include palm oil, palm kernel oil, and coconut oil. Avoid foods with partially hydrogenated oils in them. These contain trans fats. Examples are stick margarine, some tub margarines, cookies, crackers, and other baked  goods. Avoid fried foods. General information Eat more home-cooked food and less restaurant, buffet, and fast food. Limit or avoid alcohol. Limit foods that are high in added sugar and simple starches such as foods made using white refined flour (white breads, pastries, sweets). Lose weight if you are overweight. Losing just 5-10% of your body weight can help your overall health and prevent diseases such as diabetes and heart disease. Monitor your sodium intake, especially if you have high blood pressure. Talk with your health care provider about your sodium intake. Try to incorporate more vegetarian meals weekly. What foods should I eat? Fruits All fresh, canned (in natural juice), or frozen fruits. Vegetables Fresh or frozen vegetables (raw, steamed, roasted, or grilled). Green salads. Grains Most grains. Choose whole wheat and whole grains most of the time. Rice and pasta, including brown  rice and pastas made with whole wheat. Meats and other proteins Lean, well-trimmed beef, veal, pork, and lamb. Chicken and [place] without skin. All fish and shellfish. Wild duck, rabbit, pheasant, and venison. Egg whites or low-cholesterol egg substitutes. Dried beans, peas, lentils, and tofu. Seeds and most nuts. Dairy Low-fat or nonfat cheeses, including ricotta and mozzarella. Skim or 1% milk (liquid, powdered, or evaporated). Buttermilk made with low-fat milk. Nonfat or low-fat yogurt. Fats and oils Non-hydrogenated (trans-free) margarines. Vegetable oils, including soybean, sesame, sunflower, olive, avocado, peanut, safflower, corn, canola, and cottonseed. Salad dressings or mayonnaise made with a vegetable oil. Beverages Water (mineral or sparkling). Coffee and tea. Unsweetened ice tea. Diet beverages. Sweets and desserts Sherbet, gelatin, and fruit ice. Small amounts of dark chocolate. Limit all sweets and desserts. Seasonings and condiments All seasonings and condiments. The items listed  above may not be a complete list of foods and beverages you can eat. Contact a dietitian for more options. What foods should I avoid? Fruits Canned fruit in heavy syrup. Fruit in cream or butter sauce. Fried fruit. Limit coconut. Vegetables Vegetables cooked in cheese, cream, or butter sauce. Fried vegetables. Grains Breads made with saturated or trans fats, oils, or whole milk. Croissants. Sweet rolls. Donuts. High-fat crackers, such as cheese crackers and chips. Meats and other proteins Fatty meats, such as hot dogs, ribs, sausage, bacon, rib-eye roast or steak. High-fat deli meats, such as salami and bologna. Caviar. Domestic duck and goose. Organ meats, such as liver. Dairy Cream, sour cream, cream cheese, and creamed cottage cheese. Whole-milk cheeses. Whole or 2% milk (liquid, evaporated, or condensed). Whole buttermilk. Cream sauce or high-fat cheese sauce. Whole-milk yogurt. Fats and oils Meat fat, or shortening. Cocoa butter, hydrogenated oils, palm oil, coconut oil, palm kernel oil. Solid fats and shortenings, including bacon fat, salt pork, lard, and butter. Nondairy cream substitutes. Salad dressings with cheese or sour cream. Beverages Regular sodas and any drinks with added sugar. Sweets and desserts Frosting. Pudding. Cookies. Cakes. Pies. Milk chocolate or white chocolate. Buttered syrups. Full-fat ice cream or ice cream drinks. The items listed above may not be a complete list of foods and beverages to avoid. Contact a dietitian for more information. Summary Heart-healthy meal planning includes limiting unhealthy fats, increasing healthy fats, limiting salt (sodium) intake and making other diet and lifestyle changes. Lose weight if you are overweight. Losing just 5-10% of your body weight can help your overall health and prevent diseases such as diabetes and heart disease. Focus on eating a balance of foods, including fruits and vegetables, low-fat or nonfat dairy, lean  protein, nuts and legumes, whole grains, and heart-healthy oils and fats. This information is not intended to replace advice given to you by your health care provider. Make sure you discuss any questions you have with your health care provider. Document Revised: 12/08/2021 Document Reviewed: 12/08/2021 Elsevier Patient Education  2024 [employer].

## 2023-05-25 ENCOUNTER — Other Ambulatory Visit: Payer: Self-pay | Admitting: Cardiology

## 2023-05-25 ENCOUNTER — Telehealth: Payer: Self-pay | Admitting: Nurse Practitioner

## 2023-05-25 ENCOUNTER — Other Ambulatory Visit: Payer: Self-pay | Admitting: Nurse Practitioner

## 2023-05-25 DIAGNOSIS — R42 Dizziness and giddiness: Secondary | ICD-10-CM

## 2023-05-25 DIAGNOSIS — E782 Mixed hyperlipidemia: Secondary | ICD-10-CM

## 2023-05-25 DIAGNOSIS — I10 Essential (primary) hypertension: Secondary | ICD-10-CM

## 2023-05-25 DIAGNOSIS — N400 Enlarged prostate without lower urinary tract symptoms: Secondary | ICD-10-CM

## 2023-05-26 MED ORDER — MECLIZINE HCL 25 MG PO TABS: 25.0000 mg | ORAL_TABLET | Freq: Three times a day (TID) | ORAL | 0 refills | Status: DC | PRN

## 2023-05-26 NOTE — Telephone Encounter (Signed)
Meclizine sent to [facility] today. Left detailed message on patients voicemail that medication sent in

## 2023-05-26 NOTE — Telephone Encounter (Signed)
Meclizine prescription sent to [facility]  Meds ordered this encounter  Medications   meclizine (ANTIVERT) 25 MG tablet    Sig: Take 1 tablet (25 mg total) by mouth 3 (three) times daily as needed for dizziness.    Dispense:  30 tablet    Refill:  0    Order Specific Question:   Supervising Provider    Answer:   Merrilee Jansky [[id number]]   Mary-Margaret Daphine Deutscher, FNP

## 2023-06-01 ENCOUNTER — Other Ambulatory Visit: Payer: Self-pay | Admitting: Nurse Practitioner

## 2023-06-08 ENCOUNTER — Other Ambulatory Visit: Payer: Self-pay | Admitting: Nurse Practitioner

## 2023-06-11 DIAGNOSIS — R001 Bradycardia, unspecified: Secondary | ICD-10-CM | POA: Diagnosis not present

## 2023-06-29 ENCOUNTER — Telehealth: Payer: Self-pay | Admitting: Nurse Practitioner

## 2023-06-29 NOTE — Telephone Encounter (Signed)
Left message to contact office to schedule an appt

## 2023-07-01 ENCOUNTER — Other Ambulatory Visit: Payer: Self-pay | Admitting: Nurse Practitioner

## 2023-07-26 ENCOUNTER — Encounter: Payer: Self-pay | Admitting: Nurse Practitioner

## 2023-07-26 ENCOUNTER — Ambulatory Visit (INDEPENDENT_AMBULATORY_CARE_PROVIDER_SITE_OTHER): Payer: Medicare Other | Admitting: Nurse Practitioner

## 2023-07-26 VITALS — BP 122/71 | HR 69 | Temp 98.0°F | Resp 20 | Ht 70.0 in | Wt 187.0 lb

## 2023-07-26 DIAGNOSIS — J069 Acute upper respiratory infection, unspecified: Secondary | ICD-10-CM

## 2023-07-26 MED ORDER — DOXYCYCLINE HYCLATE 100 MG PO TABS
100.0000 mg | ORAL_TABLET | Freq: Two times a day (BID) | ORAL | 0 refills | Status: DC
Start: 2023-07-26 — End: 2024-01-31

## 2023-07-26 NOTE — Patient Instructions (Signed)
1. Take meds as prescribed 2. Use a cool mist humidifier especially during the winter months and when heat has been humid. 3. Use saline nose sprays frequently 4. Saline irrigations of the nose can be very helpful if done frequently.  * 4X daily for 1 week*  * Use of a nettie pot can be helpful with this. Follow directions with this* 5. Drink plenty of fluids 6. Keep thermostat turn down low 7.For any cough or congestion- mucinex OTC 8. For fever or aces or pains- take tylenol or ibuprofen appropriate for age and weight.  * for fevers greater than 101 orally you may alternate ibuprofen and tylenol every  3 hours.

## 2023-07-26 NOTE — Progress Notes (Signed)
Subjective:    Patient ID: Francisco [name], male    DOB: [birth year], 82 y.o.   MRN: [id number]   Chief Complaint: Cough and Nasal Congestion   URI  This is a new problem. The current episode started in the past 7 days. The problem has been waxing and waning. There has been no fever. Associated symptoms include congestion, coughing and rhinorrhea. Pertinent negatives include no headaches, sinus pain or sore throat. He has tried nothing for the symptoms. The treatment provided mild relief.    Patient Active Problem List   Diagnosis Date Noted   Failure to thrive in adult 12/07/2022   C4 cervical fracture (HCC) 08/18/2022   Constipation    Normocytic anemia 07/06/2022   Thrombocytopenia (HCC) 05/06/2022   Chronic kidney disease, stage 3a (HCC) 05/06/2022   Peripheral vascular disease, unspecified (HCC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 12/16/2021   Chest pain of uncertain etiology 12/16/2021   BPH (benign prostatic hyperplasia) 08/06/2015   Asthma, chronic 04/04/2015   Essential hypertension 04/12/2013   GAD (generalized anxiety disorder) 04/12/2013   GERD without esophagitis 04/12/2013   Mixed hyperlipidemia 04/12/2013       Review of Systems  HENT:  Positive for congestion and rhinorrhea. Negative for sinus pain and sore throat.   Respiratory:  Positive for cough.   Neurological:  Negative for headaches.       Objective:   Physical Exam Vitals reviewed.  Constitutional:      Appearance: Normal appearance.  HENT:     Right Ear: Tympanic membrane normal.     Left Ear: Tympanic membrane normal.     Nose: Congestion and rhinorrhea present.     Mouth/Throat:     Pharynx: No oropharyngeal exudate or posterior oropharyngeal erythema.  Eyes:     Pupils: Pupils are equal, round, and reactive to light.  Cardiovascular:     Rate and Rhythm: Normal rate and regular rhythm.     Heart sounds: Normal heart sounds.  Pulmonary:     Effort: Pulmonary effort is normal.     Breath  sounds: Normal breath sounds.  Musculoskeletal:     Cervical back: Normal range of motion.  Skin:    General: Skin is warm.  Neurological:     General: No focal deficit present.     Mental Status: He is alert and oriented to person, place, and time.  Psychiatric:        Mood and Affect: Mood normal.        Behavior: Behavior normal.    BP 122/71   Pulse 69   Temp 98 F (36.7 C) (Temporal)   Resp 20   Ht 5\' 10"  (1.778 m)   Wt 187 lb (84.8 kg)   SpO2 97%   BMI 26.83 kg/m         Assessment & Plan:   [name] in today with chief complaint of Cough and Nasal Congestion   1. URI with cough and congestion 1. Take meds as prescribed 2. Use a cool mist humidifier especially during the winter months and when heat has been humid. 3. Use saline nose sprays frequently 4. Saline irrigations of the nose can be very helpful if done frequently.  * 4X daily for 1 week*  * Use of a nettie pot can be helpful with this. Follow directions with this* 5. Drink plenty of fluids 6. Keep thermostat turn down low 7.For any cough or congestion- mucinex OTC 8. For fever or aces or pains- take tylenol  or ibuprofen appropriate for age and weight.  * for fevers greater than 101 orally you may alternate ibuprofen and tylenol every  3 hours.    - doxycycline (VIBRA-TABS) 100 MG tablet; Take 1 tablet (100 mg total) by mouth 2 (two) times daily. 1 po bid  Dispense: 20 tablet; Refill: 0    The above assessment and management plan was discussed with the patient. The patient verbalized understanding of and has agreed to the management plan. Patient is aware to call the clinic if symptoms persist or worsen. Patient is aware when to return to the clinic for a follow-up visit. Patient educated on when it is appropriate to go to the emergency department.   Francisco Daphine Deutscher, FNP

## 2023-08-05 ENCOUNTER — Ambulatory Visit: Payer: Medicare Other | Admitting: Nurse Practitioner

## 2023-08-05 ENCOUNTER — Encounter: Payer: Self-pay | Admitting: Nurse Practitioner

## 2023-08-05 VITALS — BP 117/74 | HR 67 | Temp 98.5°F | Resp 20 | Ht 70.0 in | Wt 188.0 lb

## 2023-08-05 DIAGNOSIS — I10 Essential (primary) hypertension: Secondary | ICD-10-CM | POA: Diagnosis not present

## 2023-08-05 DIAGNOSIS — N400 Enlarged prostate without lower urinary tract symptoms: Secondary | ICD-10-CM

## 2023-08-05 DIAGNOSIS — N1831 Chronic kidney disease, stage 3a: Secondary | ICD-10-CM

## 2023-08-05 DIAGNOSIS — K219 Gastro-esophageal reflux disease without esophagitis: Secondary | ICD-10-CM

## 2023-08-05 DIAGNOSIS — E782 Mixed hyperlipidemia: Secondary | ICD-10-CM

## 2023-08-05 DIAGNOSIS — Z23 Encounter for immunization: Secondary | ICD-10-CM

## 2023-08-05 DIAGNOSIS — D649 Anemia, unspecified: Secondary | ICD-10-CM

## 2023-08-05 DIAGNOSIS — Z79891 Long term (current) use of opiate analgesic: Secondary | ICD-10-CM | POA: Diagnosis not present

## 2023-08-05 DIAGNOSIS — D696 Thrombocytopenia, unspecified: Secondary | ICD-10-CM

## 2023-08-05 DIAGNOSIS — F411 Generalized anxiety disorder: Secondary | ICD-10-CM

## 2023-08-05 DIAGNOSIS — R627 Adult failure to thrive: Secondary | ICD-10-CM

## 2023-08-05 MED ORDER — PANTOPRAZOLE SODIUM 40 MG PO TBEC
40.0000 mg | DELAYED_RELEASE_TABLET | Freq: Every day | ORAL | 1 refills | Status: DC
Start: 2023-08-05 — End: 2023-10-11

## 2023-08-05 MED ORDER — ALPRAZOLAM 1 MG PO TABS
1.0000 mg | ORAL_TABLET | Freq: Two times a day (BID) | ORAL | 3 refills | Status: DC | PRN
Start: 2023-08-05 — End: 2023-10-28

## 2023-08-05 MED ORDER — TAMSULOSIN HCL 0.4 MG PO CAPS
0.4000 mg | ORAL_CAPSULE | Freq: Every day | ORAL | 0 refills | Status: DC
Start: 2023-08-05 — End: 2023-10-13

## 2023-08-05 MED ORDER — PANTOPRAZOLE SODIUM 40 MG PO TBEC
40.0000 mg | DELAYED_RELEASE_TABLET | Freq: Every day | ORAL | 1 refills | Status: DC
Start: 2023-08-05 — End: 2023-08-05

## 2023-08-05 NOTE — Patient Instructions (Signed)
Fall Prevention in the Home, Adult Falls can cause injuries and can happen to people of all ages. There are many things you can do to make your home safer and to help prevent falls. What actions can I take to prevent falls? General information Use good lighting in all rooms. Make sure to: Replace any light bulbs that burn out. Turn on the lights in dark areas and use night-lights. Keep items that you use often in easy-to-reach places. Lower the shelves around your home if needed. Move furniture so that there are clear paths around it. Do not use throw rugs or other things on the floor that can make you trip. If any of your floors are uneven, fix them. Add color or contrast paint or tape to clearly mark and help you see: Grab bars or handrails. First and last steps of staircases. Where the edge of each step is. If you use a ladder or stepladder: Make sure that it is fully opened. Do not climb a closed ladder. Make sure the sides of the ladder are locked in place. Have someone hold the ladder while you use it. Know where your pets are as you move through your home. What can I do in the bathroom?     Keep the floor dry. Clean up any water on the floor right away. Remove soap buildup in the bathtub or shower. Buildup makes bathtubs and showers slippery. Use non-skid mats or decals on the floor of the bathtub or shower. Attach bath mats securely with double-sided, non-slip rug tape. If you need to sit down in the shower, use a non-slip stool. Install grab bars by the toilet and in the bathtub and shower. Do not use towel bars as grab bars. What can I do in the bedroom? Make sure that you have a light by your bed that is easy to reach. Do not use any sheets or blankets on your bed that hang to the floor. Have a firm chair or bench with side arms that you can use for support when you get dressed. What can I do in the kitchen? Clean up any spills right away. If you need to reach something  above you, use a step stool with a grab bar. Keep electrical cords out of the way. Do not use floor polish or wax that makes floors slippery. What can I do with my stairs? Do not leave anything on the stairs. Make sure that you have a light switch at the top and the bottom of the stairs. Make sure that there are handrails on both sides of the stairs. Fix handrails that are broken or loose. Install non-slip stair treads on all your stairs if they do not have carpet. Avoid having throw rugs at the top or bottom of the stairs. Choose a carpet that does not hide the edge of the steps on the stairs. Make sure that the carpet is firmly attached to the stairs. Fix carpet that is loose or worn. What can I do on the outside of my home? Use bright outdoor lighting. Fix the edges of walkways and driveways and fix any cracks. Clear paths of anything that can make you trip, such as tools or rocks. Add color or contrast paint or tape to clearly mark and help you see anything that might make you trip as you walk through a door, such as a raised step or threshold. Trim any bushes or trees on paths to your home. Check to see if handrails are loose  or broken and that both sides of all steps have handrails. Install guardrails along the edges of any raised decks and porches. Have leaves, snow, or ice cleared regularly. Use sand, salt, or ice melter on paths if you live where there is ice and snow during the winter. Clean up any spills in your garage right away. This includes grease or oil spills. What other actions can I take? Review your medicines with your doctor. Some medicines can cause dizziness or changes in blood pressure, which increase your risk of falling. Wear shoes that: Have a low heel. Do not wear high heels. Have rubber bottoms and are closed at the toe. Feel good on your feet and fit well. Use tools that help you move around if needed. These include: Canes. Walkers. Scooters. Crutches. Ask  your doctor what else you can do to help prevent falls. This may include seeing a physical therapist to learn to do exercises to move better and get stronger. Where to find more information Centers for Disease Control and Prevention, STEADI: TonerPromos.no [employer] on Aging: BaseRingTones.pl National Institute on Aging: BaseRingTones.pl Contact a doctor if: You are afraid of falling at home. You feel weak, drowsy, or dizzy at home. You fall at home. Get help right away if you: Lose consciousness or have trouble moving after a fall. Have a fall that causes a head injury. These symptoms may be an emergency. Get help right away. Call 911. Do not wait to see if the symptoms will go away. Do not drive yourself to the hospital. This information is not intended to replace advice given to you by your health care provider. Make sure you discuss any questions you have with your health care provider. Document Revised: 07/06/2022 Document Reviewed: 07/06/2022 Elsevier Patient Education  2024 [employer].

## 2023-08-05 NOTE — Progress Notes (Signed)
Subjective:    Patient ID: Francisco [name], male    DOB: Jan 16, 1941, 82 y.o.   MRN: [id number]   Chief Complaint: Medical Management of Chronic Issues    HPI:  Francisco [name] is a 82 y.o. who identifies as a male who was assigned male at birth.   Social history: Lives with: by himself Work history: retired   Water engineer in today for follow up of the following chronic medical issues:  1. Essential hypertension No c/o chest pain, sob or headache. Does not check blood pressure at home. BP Readings from Last 3 Encounters:  08/05/23 117/74  07/26/23 122/71  05/24/23 (!) 110/56     2. Mixed hyperlipidemia Does not watch diet and does no dedicated exercise. Lab Results  Component Value Date   CHOL 156 05/06/2023   HDL 46 05/06/2023   LDLCALC 82 05/06/2023   TRIG 165 (H) 05/06/2023   CHOLHDL 3.4 05/06/2023     3. GERD without esophagitis Is on protonix and is doing well.  4. Chronic kidney disease, stage 3a (HCC) No voiding issues. No edema Lab Results  Component Value Date   CREATININE 1.75 (H) 05/06/2023     5. Thrombocytopenia (HCC) Lab Results  Component Value Date   PLT 163 05/06/2023     6. Benign prostatic hyperplasia, unspecified whether lower urinary tract symptoms present No voiding issues. Lab Results  Component Value Date   PSA1 1.4 05/06/2023   PSA1 2.2 04/09/2020   PSA1 1.3 02/09/2018   PSA 1.2 12/27/2014   PSA 1.0 07/14/2013      7. Normocytic anemia Says he feels tired all the time. Is very inactive. Lab Results  Component Value Date   HGB 14.2 05/06/2023     8. Failure to thrive in adult No recent weight changes Wt Readings from Last 3 Encounters:  08/05/23 188 lb (85.3 kg)  07/26/23 187 lb (84.8 kg)  05/24/23 183 lb (83 kg)   BMI Readings from Last 3 Encounters:  08/05/23 26.98 kg/m  07/26/23 26.83 kg/m  05/24/23 26.26 kg/m      New complaints: None today  Allergies  Allergen Reactions   Penicillins Anaphylaxis     REACTION: swelling/hives  Has patient had a PCN reaction causing immediate rash, facial/tongue/throat swelling, SOB or lightheadedness with hypotension:yes  Has patient had a PCN reaction causing severe rash involving mucus membranes or skin necrosis: Yes  Has patient had a PCN reaction that required hospitalization No  Has patient had a PCN reaction occurring within the last 10 years: No  If all of the above answers are "NO", then may proceed with Cephalosporin use.  REACTION: swelling/hives, Has patient had a PCN reaction causing immediate rash, facial/tongue/throat swelling, SOB or lightheadedness with hypotension:yes, Has patient had a PCN reaction causing severe rash involving mucus membranes or skin necrosis: Yes, Has patient had a PCN reaction that required hospitalization No, Has patient had a PCN reaction occurring within the last 10 years: No, If all of the above answers are "NO", then may proceed with Cephalosporin use.   Crestor [Rosuvastatin] Other (See Comments)    Unknown per Pt   Lipitor [Atorvastatin] Other (See Comments)    Unknown per Pt   Symbicort [Budesonide-Formoterol Fumarate] Other (See Comments)    Pain Behind ribs Unknown per Pt   Outpatient Encounter Medications as of 08/05/2023  Medication Sig   ALPRAZolam (XANAX) 1 MG tablet Take 1 tablet (1 mg total) by mouth 2 (two) times daily as needed  for sleep or anxiety.   aspirin 81 MG tablet Take 81 mg by mouth daily.   doxycycline (VIBRA-TABS) 100 MG tablet Take 1 tablet (100 mg total) by mouth 2 (two) times daily. 1 po bid   escitalopram (LEXAPRO) 10 MG tablet Take 2 tablets (20 mg total) by mouth daily.   meclizine (ANTIVERT) 25 MG tablet Take 1 tablet by mouth twice daily   metoprolol succinate (TOPROL-XL) 25 MG 24 hr tablet Take 1 tablet (25 mg total) by mouth 2 (two) times daily.   pantoprazole (PROTONIX) 40 MG tablet Take 1 tablet (40 mg total) by mouth daily.   polyethylene glycol (MIRALAX / GLYCOLAX) 17  g packet Take 17 g by mouth 2 (two) times daily.   senna (SENOKOT) 8.6 MG TABS tablet Take 1 tablet (8.6 mg total) by mouth daily.   tamsulosin (FLOMAX) 0.4 MG CAPS capsule TAKE 1 CAPSULE BY MOUTH DAILY   No facility-administered encounter medications on file as of 08/05/2023.    Past Surgical History:  Procedure Laterality Date   AMPUTATION Left 08/05/2016   Procedure: REVISION AMPUTATION LEFT INDEX FINGER REPAIR LACERATION LEFT THUMB;  Surgeon: Bradly Bienenstock, MD;  Location: [facility] OR;  Service: Orthopedics;  Laterality: Left;   BILIARY STENT PLACEMENT N/A 07/09/2022   Procedure: BILIARY STENT PLACEMENT;  Surgeon: Meryl Dare, MD;  Location: [facility] ENDOSCOPY;  Service: Gastroenterology;  Laterality: N/A;   CHOLECYSTECTOMY N/A 07/05/2022   Procedure: LAPAROSCOPIC CHOLECYSTECTOMY;  Surgeon: Fritzi Mandes, MD;  Location: [facility] ORS;  Service: General;  Laterality: N/A;   ENDOSCOPIC RETROGRADE CHOLANGIOPANCREATOGRAPHY (ERCP) WITH PROPOFOL N/A 12/08/2022   Procedure: ENDOSCOPIC RETROGRADE CHOLANGIOPANCREATOGRAPHY (ERCP) WITH PROPOFOL;  Surgeon: Meryl Dare, MD;  Location: [facility] ENDOSCOPY;  Service: Gastroenterology;  Laterality: N/A;   ERCP N/A 07/09/2022   Procedure: ENDOSCOPIC RETROGRADE CHOLANGIOPANCREATOGRAPHY (ERCP);  Surgeon: Meryl Dare, MD;  Location: [facility];  Service: Gastroenterology;  Laterality: N/A;   ESOPHAGOGASTRODUODENOSCOPY (EGD) WITH PROPOFOL N/A 12/08/2022   Procedure: ESOPHAGOGASTRODUODENOSCOPY (EGD) WITH PROPOFOL;  Surgeon: Napoleon Form, MD;  Location: [facility] ENDOSCOPY;  Service: Gastroenterology;  Laterality: N/A;   POSTERIOR CERVICAL FUSION/FORAMINOTOMY N/A 08/18/2022   Procedure: C4-5 Posterior cervical fusion with lateral mass fixation;  Surgeon: Coletta Memos, MD;  Location: [facility] OR;  Service: Neurosurgery;  Laterality: N/A;   REMOVAL OF STONES  12/08/2022   Procedure: REMOVAL OF STONES;  Surgeon: Meryl Dare, MD;  Location: [facility] ENDOSCOPY;  Service: Gastroenterology;;    Dennison Mascot  07/09/2022   Procedure: SPHINCTEROTOMY;  Surgeon: Meryl Dare, MD;  Location: [facility];  Service: Gastroenterology;;   SPINE SURGERY     STENT REMOVAL  12/08/2022   Procedure: STENT REMOVAL;  Surgeon: Meryl Dare, MD;  Location: [facility] ENDOSCOPY;  Service: Gastroenterology;;    Family History  Problem Relation Age of Onset   Healthy Mother    Transient ischemic attack Father    Rheum arthritis Daughter    Amblyopia Neg Hx    Blindness Neg Hx    Cataracts Neg Hx    Glaucoma Neg Hx    Macular degeneration Neg Hx    Retinal detachment Neg Hx    Strabismus Neg Hx    Retinitis pigmentosa Neg Hx       Controlled substance contract: n/a      Review of Systems  Constitutional:  Negative for diaphoresis.  Eyes:  Negative for pain.  Respiratory:  Negative for shortness of breath.   Cardiovascular:  Negative for chest pain, palpitations and leg swelling.  Gastrointestinal:  Negative for abdominal pain.  Endocrine: Negative for polydipsia.  Skin:  Negative for rash.  Neurological:  Negative for dizziness, weakness and headaches.  Hematological:  Does not bruise/bleed easily.  All other systems reviewed and are negative.      Objective:   Physical Exam Vitals and nursing note reviewed.  Constitutional:      Appearance: Normal appearance. He is well-developed.  HENT:     Head: Normocephalic.     Nose: Nose normal.     Mouth/Throat:     Mouth: Mucous membranes are moist.     Pharynx: Oropharynx is clear.  Eyes:     Pupils: Pupils are equal, round, and reactive to light.  Neck:     Thyroid: No thyroid mass or thyromegaly.     Vascular: No carotid bruit or JVD.     Trachea: Phonation normal.  Cardiovascular:     Rate and Rhythm: Normal rate and regular rhythm.  Pulmonary:     Effort: Pulmonary effort is normal. No respiratory distress.     Breath sounds: Normal breath sounds.  Abdominal:     General: Bowel sounds are normal.      Palpations: Abdomen is soft.     Tenderness: There is no abdominal tenderness.  Musculoskeletal:        General: Normal range of motion.     Cervical back: Normal range of motion and neck supple.     Comments: Gait slow and steady  Lymphadenopathy:     Cervical: No cervical adenopathy.  Skin:    General: Skin is warm and dry.  Neurological:     Mental Status: He is alert and oriented to person, place, and time.  Psychiatric:        Behavior: Behavior normal.        Thought Content: Thought content normal.        Judgment: Judgment normal.    BP 117/74   Pulse 67   Temp 98.5 F (36.9 C) (Temporal)   Resp 20   Ht 5\' 10"  (1.778 m)   Wt 188 lb (85.3 kg)   SpO2 97%   BMI 26.98 kg/m         Assessment & Plan:   RANALD BATZ comes in today with chief complaint of Medical Management of Chronic Issues   Diagnosis and orders addressed:  1. Essential hypertension Low sodium diet - CBC with Differential/Platelet - CMP14+EGFR  2. Mixed hyperlipidemia Low fta diet - Lipid panel  3. GERD without esophagitis Avoid spicy foods Do not eat 2 hours prior to bedtime - pantoprazole (PROTONIX) 40 MG tablet; Take 1 tablet (40 mg total) by mouth daily.  Dispense: 90 tablet; Refill: 1  4. Chronic kidney disease, stage 3a (HCC) Labs pending  5. Thrombocytopenia (HCC) Labs pending  6. Benign prostatic hyperplasia, unspecified whether lower urinary tract symptoms present Report any voiding issues - tamsulosin (FLOMAX) 0.4 MG CAPS capsule; Take 1 capsule (0.4 mg total) by mouth daily.  Dispense: 100 capsule; Refill: 0  7. Normocytic anemia Labs pending  8. Failure to thrive in adult  9. GAD (generalized anxiety disorder) Stress management - ToxASSURE Select 13 (MW), Urine - ALPRAZolam (XANAX) 1 MG tablet; Take 1 tablet (1 mg total) by mouth 2 (two) times daily as needed for sleep or anxiety.  Dispense: 60 tablet; Refill: 3   Labs pending Health Maintenance  reviewed Diet and exercise encouraged  Follow up plan: 6 months   Mary-Margaret Daphine Deutscher, FNP

## 2023-08-06 LAB — CBC WITH DIFFERENTIAL/PLATELET
Basophils Absolute: 0.1 10*3/uL (ref 0.0–0.2)
Basos: 1 %
EOS (ABSOLUTE): 0.1 10*3/uL (ref 0.0–0.4)
Eos: 2 %
Hematocrit: 40.7 % (ref 37.5–51.0)
Hemoglobin: 14 g/dL (ref 13.0–17.7)
Immature Grans (Abs): 0 10*3/uL (ref 0.0–0.1)
Immature Granulocytes: 0 %
Lymphocytes Absolute: 1.6 10*3/uL (ref 0.7–3.1)
Lymphs: 21 %
MCH: 33.7 pg — ABNORMAL HIGH (ref 26.6–33.0)
MCHC: 34.4 g/dL (ref 31.5–35.7)
MCV: 98 fL — ABNORMAL HIGH (ref 79–97)
Monocytes Absolute: 0.5 10*3/uL (ref 0.1–0.9)
Monocytes: 7 %
Neutrophils Absolute: 5.4 10*3/uL (ref 1.4–7.0)
Neutrophils: 69 %
Platelets: 175 10*3/uL (ref 150–450)
RBC: 4.15 x10E6/uL (ref 4.14–5.80)
RDW: 13.2 % (ref 11.6–15.4)
WBC: 7.8 10*3/uL (ref 3.4–10.8)

## 2023-08-06 LAB — CMP14+EGFR
ALT: 9 IU/L (ref 0–44)
AST: 14 IU/L (ref 0–40)
Albumin: 4.5 g/dL (ref 3.7–4.7)
Alkaline Phosphatase: 70 IU/L (ref 44–121)
BUN/Creatinine Ratio: 12 (ref 10–24)
BUN: 19 mg/dL (ref 8–27)
Bilirubin Total: 0.5 mg/dL (ref 0.0–1.2)
CO2: 22 mmol/L (ref 20–29)
Calcium: 8.7 mg/dL (ref 8.6–10.2)
Chloride: 102 mmol/L (ref 96–106)
Creatinine, Ser: 1.57 mg/dL — ABNORMAL HIGH (ref 0.76–1.27)
Globulin, Total: 1.7 g/dL (ref 1.5–4.5)
Glucose: 96 mg/dL (ref 70–99)
Potassium: 4.2 mmol/L (ref 3.5–5.2)
Sodium: 141 mmol/L (ref 134–144)
Total Protein: 6.2 g/dL (ref 6.0–8.5)
eGFR: 44 mL/min/{1.73_m2} — ABNORMAL LOW (ref >59)

## 2023-08-06 LAB — LIPID PANEL
Chol/HDL Ratio: 3.4 ratio (ref 0.0–5.0)
Cholesterol, Total: 142 mg/dL (ref 100–199)
HDL: 42 mg/dL (ref >39)
LDL Chol Calc (NIH): 66 mg/dL (ref 0–99)
Triglycerides: 205 mg/dL — ABNORMAL HIGH (ref 0–149)
VLDL Cholesterol Cal: 34 mg/dL (ref 5–40)

## 2023-08-12 LAB — TOXASSURE SELECT 13 (MW), URINE

## 2023-09-02 ENCOUNTER — Ambulatory Visit: Payer: Medicare Other | Admitting: Internal Medicine

## 2023-09-02 ENCOUNTER — Other Ambulatory Visit: Payer: Self-pay | Admitting: Nurse Practitioner

## 2023-09-02 DIAGNOSIS — F411 Generalized anxiety disorder: Secondary | ICD-10-CM

## 2023-10-09 ENCOUNTER — Other Ambulatory Visit: Payer: Self-pay | Admitting: Nurse Practitioner

## 2023-10-09 DIAGNOSIS — K219 Gastro-esophageal reflux disease without esophagitis: Secondary | ICD-10-CM

## 2023-10-13 ENCOUNTER — Other Ambulatory Visit: Payer: Self-pay | Admitting: Nurse Practitioner

## 2023-10-13 DIAGNOSIS — N400 Enlarged prostate without lower urinary tract symptoms: Secondary | ICD-10-CM

## 2023-10-25 ENCOUNTER — Other Ambulatory Visit: Payer: Self-pay | Admitting: Nurse Practitioner

## 2023-10-25 DIAGNOSIS — F411 Generalized anxiety disorder: Secondary | ICD-10-CM

## 2023-10-26 ENCOUNTER — Other Ambulatory Visit: Payer: Self-pay | Admitting: Nurse Practitioner

## 2023-10-26 DIAGNOSIS — F411 Generalized anxiety disorder: Secondary | ICD-10-CM

## 2023-10-28 ENCOUNTER — Telehealth: Payer: Self-pay | Admitting: Family Medicine

## 2023-10-28 ENCOUNTER — Other Ambulatory Visit: Payer: Self-pay | Admitting: Nurse Practitioner

## 2023-10-28 DIAGNOSIS — F411 Generalized anxiety disorder: Secondary | ICD-10-CM

## 2023-10-28 NOTE — Telephone Encounter (Signed)
Refills resent to [facility] per MMM

## 2023-10-28 NOTE — Telephone Encounter (Signed)
Copied from CRM [id number]. Topic: Clinical - Prescription Issue >> Oct 28, 2023  1:07 PM Francisco [name] wrote: Reason for CRM: PT states his [facility] pharmacy still says they dont have the re-fills for his ALPRAZolam Prudy Feeler) 1 MG tablet.

## 2023-10-29 ENCOUNTER — Other Ambulatory Visit: Payer: Self-pay | Admitting: Nurse Practitioner

## 2023-10-29 DIAGNOSIS — F411 Generalized anxiety disorder: Secondary | ICD-10-CM

## 2023-11-24 ENCOUNTER — Other Ambulatory Visit: Payer: Self-pay | Admitting: Nurse Practitioner

## 2023-11-24 DIAGNOSIS — F411 Generalized anxiety disorder: Secondary | ICD-10-CM

## 2023-11-24 NOTE — Telephone Encounter (Signed)
 Copied from CRM [id number]. Topic: Clinical - Medication Refill >> Nov 24, 2023  3:53 PM Antonio DEL wrote: Most Recent Primary Care Visit:  Provider: GLADIS MUSTARD  Department: [facility]  Visit Type: OFFICE VISIT  Date: 08/05/2023  Medication: ALPRAZolam  (XANAX ) 1 MG tablet  Has the patient contacted their pharmacy? Yes, no refills left (Agent: If no, request that the patient contact the pharmacy for the refill. If patient does not wish to contact the pharmacy document the reason why and proceed with request.) (Agent: If yes, when and what did the pharmacy advise?)  Is this the correct pharmacy for this prescription? Yes If no, delete pharmacy and type the correct one.  This is the patient's preferred pharmacy:    [id number] - [place], [place] - [address] [place] [address] Phone: [phone] Fax: [phone]   Has the prescription been filled recently? Yes  Is the patient out of the medication? No  Has the patient been seen for an appointment in the last year OR does the patient have an upcoming appointment? Yes  Can we respond through MyChart? No  Agent: Please be advised that Rx refills may take up to 3 business days. We ask that you follow-up with your pharmacy.

## 2023-11-25 ENCOUNTER — Ambulatory Visit (INDEPENDENT_AMBULATORY_CARE_PROVIDER_SITE_OTHER): Payer: Medicare Other | Admitting: Nurse Practitioner

## 2023-11-25 ENCOUNTER — Encounter: Payer: Self-pay | Admitting: Nurse Practitioner

## 2023-11-25 VITALS — BP 89/57 | HR 54 | Temp 96.8°F | Ht 70.0 in | Wt 189.0 lb

## 2023-11-25 DIAGNOSIS — R051 Acute cough: Secondary | ICD-10-CM | POA: Diagnosis not present

## 2023-11-25 MED ORDER — ALPRAZOLAM 1 MG PO TABS: 1.0000 mg | ORAL_TABLET | Freq: Two times a day (BID) | ORAL | 0 refills | Status: DC | PRN

## 2023-11-25 MED ORDER — BENZONATATE 100 MG PO CAPS: 100.0000 mg | ORAL_CAPSULE | Freq: Two times a day (BID) | ORAL | 0 refills | Status: DC | PRN

## 2023-11-25 NOTE — Progress Notes (Signed)
 Subjective:    Patient ID: Francisco [name], male    DOB: [birth year], 83 y.o.   MRN: [id number]   Chief Complaint: cough  Patient in today c/o couth that started over a week ago. When he coughs it causes abdominal pain. 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    Patient Active Problem List   Diagnosis Date Noted   Failure to thrive in adult 12/07/2022   C4 cervical fracture (HCC) 08/18/2022   Constipation    Normocytic anemia 07/06/2022   Thrombocytopenia (HCC) 05/06/2022   Chronic kidney disease, stage 3a (HCC) 05/06/2022   Peripheral vascular disease, unspecified (HCC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 12/16/2021   Chest pain of uncertain etiology 12/16/2021   BPH (benign prostatic hyperplasia) 08/06/2015   Asthma, chronic 04/04/2015   Essential hypertension 04/12/2013   GAD (generalized anxiety disorder) 04/12/2013   GERD without esophagitis 04/12/2013   Mixed hyperlipidemia 04/12/2013       Review of Systems  Constitutional:  Negative for chills and fever.  HENT:  Positive for congestion.   Respiratory:  Positive for cough. Negative for shortness of breath.        Objective:   Physical Exam Vitals reviewed.  Constitutional:      Appearance: Normal appearance.  HENT:     Right Ear: Tympanic membrane normal.     Nose: No congestion.     Mouth/Throat:     Mouth: Mucous membranes are moist.  Cardiovascular:     Rate and Rhythm: Normal rate and regular rhythm.  Pulmonary:     Effort: Pulmonary effort is normal.     Breath sounds: Normal breath sounds. No wheezing or rhonchi.  Skin:    General: Skin is warm.  Neurological:     General: No focal deficit present.     Mental Status: He is alert and oriented to person, place, and time.  Psychiatric:        Mood and Affect: Mood normal.        Behavior: Behavior normal.    BP (!) 89/57   Pulse (!) 54   Temp (!) 96.8 F (36 C) (Temporal)   Ht 5' 10 (1.778 m)   Wt 189 lb (85.7  kg)   SpO2 98%   BMI 27.12 kg/m         Assessment & Plan:   Francisco [name] in today with chief complaint of Cough   1. Acute cough (Primary) 1. Take meds as prescribed 2. Use a cool mist humidifier especially during the winter months and when heat has been humid. 3. Use saline nose sprays frequently 4. Saline irrigations of the nose can be very helpful if done frequently.  * 4X daily for 1 week*  * Use of a nettie pot can be helpful with this. Follow directions with this* 5. Drink plenty of fluids 6. Keep thermostat turn down low 7.For any cough or congestion- tessalon  perles 8. For fever or aces or pains- take tylenol  or ibuprofen appropriate for age and weight.  * for fevers greater than 101 orally you may alternate ibuprofen and tylenol  every  3 hours.   Splint abdomen when coughing  - benzonatate  (TESSALON ) 100 MG capsule; Take 1 capsule (100 mg total) by mouth 2 (two) times daily as needed for cough.  Dispense: 20 capsule; Refill: 0    The above assessment and management plan was discussed with the patient. The patient verbalized understanding of and has  agreed to the management plan. Patient is aware to call the clinic if symptoms persist or worsen. Patient is aware when to return to the clinic for a follow-up visit. Patient educated on when it is appropriate to go to the emergency department.   Mary-Margaret Gladis, FNP

## 2023-11-25 NOTE — Patient Instructions (Signed)

## 2023-11-26 ENCOUNTER — Other Ambulatory Visit: Payer: Self-pay | Admitting: Nurse Practitioner

## 2023-11-26 DIAGNOSIS — F411 Generalized anxiety disorder: Secondary | ICD-10-CM

## 2023-11-27 ENCOUNTER — Other Ambulatory Visit: Payer: Self-pay | Admitting: Nurse Practitioner

## 2023-11-27 DIAGNOSIS — F411 Generalized anxiety disorder: Secondary | ICD-10-CM

## 2023-11-29 ENCOUNTER — Other Ambulatory Visit: Payer: Self-pay | Admitting: Nurse Practitioner

## 2023-11-29 DIAGNOSIS — F411 Generalized anxiety disorder: Secondary | ICD-10-CM

## 2023-11-29 MED ORDER — ALPRAZOLAM 1 MG PO TABS: 1.0000 mg | ORAL_TABLET | Freq: Two times a day (BID) | ORAL | 0 refills | Status: DC | PRN

## 2023-11-29 NOTE — Addendum Note (Signed)
 Addended by: Bennie Pierini on: 11/29/2023 01:20 PM   Modules accepted: Orders

## 2023-11-29 NOTE — Telephone Encounter (Signed)
 Copied from CRM [id number]. Topic: Clinical - Medication Refill >> Nov 29, 2023 10:49 AM Bascom RAMAN wrote: Most Recent Primary Care Visit:  Provider: GLADIS MUSTARD  Department: [facility]  Visit Type: ACUTE  Date: 11/25/2023  Medication: ALPRAZolam  (XANAX ) 1 MG tablet  Has the patient contacted their pharmacy? Yes (Agent: If no, request that the patient contact the pharmacy for the refill. If patient does not wish to contact the pharmacy document the reason why and proceed with request.) (Agent: If yes, when and what did the pharmacy advise?)  Is this the correct pharmacy for this prescription? Yes If no, delete pharmacy and type the correct one.  This is the patient's preferred pharmacy:    [id number] - [place], [place] - [address] [place] [address] Phone: [phone] Fax: [phone]   Has the prescription been filled recently?   Is the patient out of the medication?   Has the patient been seen for an appointment in the last year OR does the patient have an upcoming appointment?   Can we respond through MyChart?   Agent: Please be advised that Rx refills may take up to 3 business days. We ask that you follow-up with your pharmacy.

## 2023-11-29 NOTE — Telephone Encounter (Signed)
 Copied from CRM [id number]. Topic: Clinical - Medication Refill >> Nov 29, 2023 10:51 AM Bascom RAMAN wrote: Most Recent Primary Care Visit:  Provider: GLADIS MUSTARD  Department: [facility]  Visit Type: ACUTE  Date: 11/25/2023  Medication: ***  Has the patient contacted their pharmacy?  (Agent: If no, request that the patient contact the pharmacy for the refill. If patient does not wish to contact the pharmacy document the reason why and proceed with request.) (Agent: If yes, when and what did the pharmacy advise?)  Is this the correct pharmacy for this prescription?  If no, delete pharmacy and type the correct one.  This is the patient's preferred pharmacy:  [facility] [address] [place] - [address] [place] [address] Phone: [phone] Fax: [phone]  [facility] - [place], [place] - [address] [place] [address] Phone: [phone] Fax: [phone]   Has the prescription been filled recently?   Is the patient out of the medication?   Has the patient been seen for an appointment in the last year OR does the patient have an upcoming appointment?   Can we respond through MyChart?   Agent: Please be advised that Rx refills may take up to 3 business days. We ask that you follow-up with your pharmacy.

## 2023-12-09 ENCOUNTER — Ambulatory Visit: Payer: Medicare Other | Admitting: Nurse Practitioner

## 2023-12-09 ENCOUNTER — Ambulatory Visit: Payer: Self-pay | Admitting: Nurse Practitioner

## 2023-12-09 ENCOUNTER — Encounter: Payer: Self-pay | Admitting: Nurse Practitioner

## 2023-12-09 VITALS — BP 130/77 | HR 64 | Temp 97.1°F | Ht 70.0 in | Wt 183.0 lb

## 2023-12-09 DIAGNOSIS — R5382 Chronic fatigue, unspecified: Secondary | ICD-10-CM | POA: Diagnosis not present

## 2023-12-09 DIAGNOSIS — R051 Acute cough: Secondary | ICD-10-CM

## 2023-12-09 MED ORDER — PREDNISONE 20 MG PO TABS: 40.0000 mg | ORAL_TABLET | Freq: Every day | ORAL | 0 refills | Status: AC

## 2023-12-09 NOTE — Telephone Encounter (Signed)
  Chief Complaint: Cough x 3 weeks, nausea, and constipation Symptoms: Abd pain, nausea, constipation Frequency: constant Pertinent Negatives: Patient denies fever Disposition: [] ED /[] Urgent Care (no appt availability in office) / [x] Appointment(In office/virtual)/ []  [facility]/ [] Home Care/ [] Refused Recommended Disposition /[] [facility]/ []  Follow-up with PCP Additional Notes: Patient with cough now for 3 weeks, pt now having abd pain and nausea with some constipation. Per daughter, patient is also congested and has a sore throat. Pt was seen on 01/09 and was started on Tessalon Pearls with little relief. Appt scheduled for today.   Reason for Disposition  Cough has been present for > 3 weeks  Answer Assessment - Initial Assessment Questions 1. ONSET: "When did the cough begin?"      2-3 weeks ago  2. SEVERITY: "How bad is the cough today?"      Getting a little better  3. SPUTUM: "Describe the color of your sputum" (none, dry cough; clear, white, yellow, green)     *No Answer* 4. HEMOPTYSIS: "Are you coughing up any blood?" If so ask: "How much?" (flecks, streaks, tablespoons, etc.)     No blood  5. DIFFICULTY BREATHING: "Are you having difficulty breathing?" If Yes, ask: "How bad is it?" (e.g., mild, moderate, severe)    - MILD: No SOB at rest, mild SOB with walking, speaks normally in sentences, can lie down, no retractions, pulse < 100.    - MODERATE: SOB at rest, SOB with minimal exertion and prefers to sit, cannot lie down flat, speaks in phrases, mild retractions, audible wheezing, pulse 100-120.    - SEVERE: Very SOB at rest, speaks in single words, struggling to breathe, sitting hunched forward, retractions, pulse > 120      No shortness of breath  6. FEVER: "Do you have a fever?" If Yes, ask: "What is your temperature, how was it measured, and when did it start?"     No fever  7. CARDIAC HISTORY: "Do you have any history of heart disease?" (e.g.,  heart attack, congestive heart failure)      No cardiac history  8. LNoUNG HISTORY: "Do you have any history of lung disease?"  (e.g., pulmonary embolus, asthma, emphysema)     No lung history  9. PE RISK FACTORS: "Do you have a history of blood clots?" (or: recent major surgery, recent prolonged travel, bedridden)     Unsure  10. OTHER SYMPTOMS: "Do you have any other symptoms?" (e.g., runny nose, wheezing, chest pain)       Nausea, sore throat, congestion, and abd pain, and constipated  11. PREGNANCY: "Is there any chance you are pregnant?" "When was your last menstrual period?"       N/a  12. TRAVEL: "Have you traveled out of the country in the last month?" (e.g., travel history, exposures)       No travel  Protocols used: Cough - Acute Productive-A-AH

## 2023-12-09 NOTE — Progress Notes (Signed)
Subjective:    Patient ID: Francisco [name], male    DOB: 06-19-1941, 83 y.o.   MRN: [id number]   Chief Complaint: multiple complaints  HPI  - still has slight cough. Has gotten better since  last seen.  - He says when he swallows water it feels like it gets stuck half way down. Foods do it some. He had "throat stretched" several years ago.  - says he just dont feel good- trouble falling asleep at times. Has been napping a lot during the day. Patient Active Problem List   Diagnosis Date Noted   Failure to thrive in adult 12/07/2022   C4 cervical fracture (HCC) 08/18/2022   Constipation    Normocytic anemia 07/06/2022   Thrombocytopenia (HCC) 05/06/2022   Chronic kidney disease, stage 3a (HCC) 05/06/2022   Peripheral vascular disease, unspecified (HCC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 12/16/2021   Chest pain of uncertain etiology 12/16/2021   BPH (benign prostatic hyperplasia) 08/06/2015   Asthma, chronic 04/04/2015   Essential hypertension 04/12/2013   GAD (generalized anxiety disorder) 04/12/2013   GERD without esophagitis 04/12/2013   Mixed hyperlipidemia 04/12/2013         Review of Systems  Constitutional:  Negative for diaphoresis.  Eyes:  Negative for pain.  Respiratory:  Negative for shortness of breath.   Cardiovascular:  Negative for chest pain, palpitations and leg swelling.  Gastrointestinal:  Negative for abdominal pain.  Endocrine: Negative for polydipsia.  Skin:  Negative for rash.  Neurological:  Negative for dizziness, weakness and headaches.  Hematological:  Does not bruise/bleed easily.  All other systems reviewed and are negative.      Objective:   Physical Exam Constitutional:      Appearance: Normal appearance.  Cardiovascular:     Rate and Rhythm: Normal rate and regular rhythm.     Heart sounds: Normal heart sounds.  Pulmonary:     Effort: Pulmonary effort is normal.     Breath sounds: Normal breath sounds.  Skin:    General: Skin is warm.   Neurological:     General: No focal deficit present.     Mental Status: He is alert and oriented to person, place, and time.  Psychiatric:        Mood and Affect: Mood normal.        Behavior: Behavior normal.     BP 130/77   Pulse 64   Temp (!) 97.1 F (36.2 C) (Temporal)   Ht 5\' 10"  (1.778 m)   Wt 183 lb (83 kg)   SpO2 98%   BMI 26.26 kg/m        Assessment & Plan:   Chase Picket Askin in today with chief complaint of Cough (Seen two weeks ago. Not much better) and getting strengled on water   1. Acute cough (Primary) Force fluids Run humidifier - predniSONE (DELTASONE) 20 MG tablet; Take 2 tablets (40 mg total) by mouth daily with breakfast for 5 days. 2 po daily for 5 days  Dispense: 10 tablet; Refill: 0  2. Chronic fatigue Labs pending - CBC with Differential/Platelet - CMP14+EGFR  3. Insomnia Bedtime routine Do not nap during the day RTO prn  The above assessment and management plan was discussed with the patient. The patient verbalized understanding of and has agreed to the management plan. Patient is aware to call the clinic if symptoms persist or worsen. Patient is aware when to return to the clinic for a follow-up visit. Patient educated on when it is appropriate  to go to the emergency department.   Mary-Margaret Daphine Deutscher, FNP

## 2023-12-09 NOTE — Patient Instructions (Signed)

## 2023-12-10 LAB — CBC WITH DIFFERENTIAL/PLATELET
Basophils Absolute: 0 10*3/uL (ref 0.0–0.2)
Basos: 0 %
EOS (ABSOLUTE): 0 10*3/uL (ref 0.0–0.4)
Eos: 0 %
Hematocrit: 37.7 % (ref 37.5–51.0)
Hemoglobin: 12.7 g/dL — ABNORMAL LOW (ref 13.0–17.7)
Immature Grans (Abs): 0 10*3/uL (ref 0.0–0.1)
Immature Granulocytes: 0 %
Lymphocytes Absolute: 1.1 10*3/uL (ref 0.7–3.1)
Lymphs: 20 %
MCH: 32.4 pg (ref 26.6–33.0)
MCHC: 33.7 g/dL (ref 31.5–35.7)
MCV: 96 fL (ref 79–97)
Monocytes Absolute: 0.4 10*3/uL (ref 0.1–0.9)
Monocytes: 7 %
Neutrophils Absolute: 4.2 10*3/uL (ref 1.4–7.0)
Neutrophils: 73 %
Platelets: 223 10*3/uL (ref 150–450)
RBC: 3.92 x10E6/uL — ABNORMAL LOW (ref 4.14–5.80)
RDW: 13.1 % (ref 11.6–15.4)
WBC: 5.8 10*3/uL (ref 3.4–10.8)

## 2023-12-10 LAB — CMP14+EGFR
ALT: 10 [IU]/L (ref 0–44)
AST: 14 [IU]/L (ref 0–40)
Albumin: 4.1 g/dL (ref 3.7–4.7)
Alkaline Phosphatase: 65 [IU]/L (ref 44–121)
BUN/Creatinine Ratio: 11 (ref 10–24)
BUN: 16 mg/dL (ref 8–27)
Bilirubin Total: 0.8 mg/dL (ref 0.0–1.2)
CO2: 22 mmol/L (ref 20–29)
Calcium: 8.9 mg/dL (ref 8.6–10.2)
Chloride: 103 mmol/L (ref 96–106)
Creatinine, Ser: 1.44 mg/dL — ABNORMAL HIGH (ref 0.76–1.27)
Globulin, Total: 2 g/dL (ref 1.5–4.5)
Glucose: 109 mg/dL — ABNORMAL HIGH (ref 70–99)
Potassium: 3.8 mmol/L (ref 3.5–5.2)
Sodium: 142 mmol/L (ref 134–144)
Total Protein: 6.1 g/dL (ref 6.0–8.5)
eGFR: 49 mL/min/{1.73_m2} — ABNORMAL LOW (ref >59)

## 2023-12-19 ENCOUNTER — Other Ambulatory Visit: Payer: Self-pay | Admitting: Nurse Practitioner

## 2023-12-19 DIAGNOSIS — F411 Generalized anxiety disorder: Secondary | ICD-10-CM

## 2023-12-21 ENCOUNTER — Other Ambulatory Visit: Payer: Self-pay | Admitting: Nurse Practitioner

## 2023-12-21 DIAGNOSIS — F411 Generalized anxiety disorder: Secondary | ICD-10-CM

## 2023-12-21 NOTE — Telephone Encounter (Signed)
 Copied from CRM [id number]. Topic: Clinical - Medication Refill >> Dec 21, 2023  9:10 AM Arlina R wrote: Most Recent Primary Care Visit:  Provider: GLADIS MUSTARD  Department: [facility]  Visit Type: ACUTE  Date: 12/09/2023  Medication:  ALPRAZolam  (XANAX ) 1 MG tablet   Has the patient contacted their pharmacy? Yes (Agent: If no, request that the patient contact the pharmacy for the refill. If patient does not wish to contact the pharmacy document the reason why and proceed with request.) (Agent: If yes, when and what did the pharmacy advise?)  Is this the correct pharmacy for this prescription? Yes If no, delete pharmacy and type the correct one.  This is the patient's preferred pharmacy:  [facility] - [place], [place] - [address] [place] [address] Phone: [phone] Fax: [phone]    Has the prescription been filled recently? Yes  Is the patient out of the medication? Yes  Has the patient been seen for an appointment in the last year OR does the patient have an upcoming appointment? Yes  Can we respond through MyChart? Yes  Agent: Please be advised that Rx refills may take up to 3 business days. We ask that you follow-up with your pharmacy.

## 2023-12-23 ENCOUNTER — Emergency Department (HOSPITAL_COMMUNITY)
Admission: EM | Admit: 2023-12-23 | Discharge: 2023-12-23 | Disposition: A | Discharge status: Home / Self Care | Payer: Medicare Other | Attending: Emergency Medicine | Admitting: Emergency Medicine

## 2023-12-23 ENCOUNTER — Other Ambulatory Visit: Payer: Self-pay

## 2023-12-23 ENCOUNTER — Encounter (HOSPITAL_COMMUNITY): Payer: Self-pay

## 2023-12-23 DIAGNOSIS — Z7982 Long term (current) use of aspirin: Secondary | ICD-10-CM | POA: Diagnosis not present

## 2023-12-23 DIAGNOSIS — Z743 Need for continuous supervision: Secondary | ICD-10-CM | POA: Diagnosis not present

## 2023-12-23 DIAGNOSIS — Z79899 Other long term (current) drug therapy: Secondary | ICD-10-CM | POA: Diagnosis not present

## 2023-12-23 DIAGNOSIS — R110 Nausea: Secondary | ICD-10-CM | POA: Diagnosis not present

## 2023-12-23 DIAGNOSIS — R109 Unspecified abdominal pain: Secondary | ICD-10-CM | POA: Diagnosis not present

## 2023-12-23 DIAGNOSIS — R1013 Epigastric pain: Secondary | ICD-10-CM | POA: Diagnosis not present

## 2023-12-23 LAB — COMPREHENSIVE METABOLIC PANEL
ALT: 12 U/L (ref 0–44)
AST: 15 U/L (ref 15–41)
Albumin: 4.1 g/dL (ref 3.5–5.0)
Alkaline Phosphatase: 52 U/L (ref 38–126)
Anion gap: 10 (ref 5–15)
BUN: 14 mg/dL (ref 8–23)
CO2: 25 mmol/L (ref 22–32)
Calcium: 8.9 mg/dL (ref 8.9–10.3)
Chloride: 101 mmol/L (ref 98–111)
Creatinine, Ser: 1.27 mg/dL — ABNORMAL HIGH (ref 0.61–1.24)
GFR, Estimated: 56 mL/min — ABNORMAL LOW (ref >60)
Glucose, Bld: 128 mg/dL — ABNORMAL HIGH (ref 70–99)
Potassium: 3.9 mmol/L (ref 3.5–5.1)
Sodium: 136 mmol/L (ref 135–145)
Total Bilirubin: 1.2 mg/dL (ref 0.0–1.2)
Total Protein: 6.6 g/dL (ref 6.5–8.1)

## 2023-12-23 LAB — CBC
HCT: 43.5 % (ref 39.0–52.0)
Hemoglobin: 14.6 g/dL (ref 13.0–17.0)
MCH: 32.6 pg (ref 26.0–34.0)
MCHC: 33.6 g/dL (ref 30.0–36.0)
MCV: 97.1 fL (ref 80.0–100.0)
Platelets: 154 10*3/uL (ref 150–400)
RBC: 4.48 MIL/uL (ref 4.22–5.81)
RDW: 14.2 % (ref 11.5–15.5)
WBC: 8 10*3/uL (ref 4.0–10.5)
nRBC: 0 % (ref 0.0–0.2)

## 2023-12-23 LAB — URINALYSIS, ROUTINE W REFLEX MICROSCOPIC
Bacteria, UA: NONE SEEN
Bilirubin Urine: NEGATIVE
Glucose, UA: NEGATIVE mg/dL
Hgb urine dipstick: NEGATIVE
Ketones, ur: NEGATIVE mg/dL
Leukocytes,Ua: NEGATIVE
Nitrite: NEGATIVE
Protein, ur: 30 mg/dL — AB
Specific Gravity, Urine: 1.016 (ref 1.005–1.030)
pH: 6 (ref 5.0–8.0)

## 2023-12-23 LAB — LIPASE, BLOOD: Lipase: 40 U/L (ref 11–51)

## 2023-12-23 MED ORDER — PANTOPRAZOLE SODIUM 40 MG IV SOLR
40.0000 mg | Freq: Once | INTRAVENOUS | Status: DC
  Filled 2023-12-23: qty 10

## 2023-12-23 MED ORDER — ESOMEPRAZOLE MAGNESIUM 40 MG PO CPDR: 40.0000 mg | DELAYED_RELEASE_CAPSULE | Freq: Every day | ORAL | 0 refills | Status: AC

## 2023-12-23 MED ORDER — CALCIUM CARBONATE ANTACID 500 MG PO CHEW
1.0000 | CHEWABLE_TABLET | Freq: Once | ORAL | Status: AC
  Administered 2023-12-23: 200 mg via ORAL
  Filled 2023-12-23: qty 1

## 2023-12-23 MED ORDER — PANTOPRAZOLE SODIUM 40 MG PO TBEC
40.0000 mg | DELAYED_RELEASE_TABLET | Freq: Once | ORAL | Status: AC
  Administered 2023-12-23: 40 mg via ORAL
  Filled 2023-12-23: qty 1

## 2023-12-23 NOTE — ED Triage Notes (Signed)
 Pt arrived via [facility] from home c/o abdominal pain. Pt denies N/V/D. Pt reports pain has been worsening for the past few days and is the worst at night.

## 2023-12-23 NOTE — Discharge Instructions (Addendum)
 Pleasure taking care of you today, you were seen for pain in your upper abdomen and feeling like food does not go all the way down when you swallow.  We are starting you on an antacid medicine but it is very important for you to follow-up with a GI doctor.  Come back to the ER for new or worsening symptoms.  We have prescribed esomeprazole  which is similar to the medicine you have taken in the past, pantoprazole .  For breakthrough symptoms you can try taking over-the-counter Tums as directed on packaging

## 2023-12-23 NOTE — ED Provider Notes (Signed)
 [place] EMERGENCY DEPARTMENT AT [facility] Provider Note   CSN: [id number] Arrival date & time: 12/23/23  1134     History  Chief Complaint  Patient presents with   Abdominal Pain    Francisco [name] is a 83 y.o. male.  He presents the ER complaining of several months of discomfort in the epigastric area, states it feels like when he swallows anything, it stops right at his xiphoid process including water.  He has had this the past and had esophageal dilatation, this was in January 2024.  He states he feels like it is related to acid reflux and is requesting prescription for acid medication, denies vomiting, diarrhea, no chest pain or shortness of breath.  No pain at this time.  States is only when he swallows anything.  He reports he has a known hiatal hernia.  No back pain, no rashes.   Abdominal Pain      Home Medications Prior to Admission medications   Medication Sig Start Date End Date Taking? Authorizing Provider  esomeprazole  (NEXIUM ) 40 MG capsule Take 1 capsule (40 mg total) by mouth daily for 14 days. 12/23/23 01/06/24 Yes Joie Reamer A, PA-C  ALPRAZolam  (XANAX ) 1 MG tablet TAKE 1 TABLET BY MOUTH TWICE  DAILY AS NEEDED FOR ANXIETY 12/20/23   Gladis, Mary-Margaret, FNP  aspirin  81 MG tablet Take 81 mg by mouth daily.    [provider]  benzonatate  (TESSALON ) 100 MG capsule Take 1 capsule (100 mg total) by mouth 2 (two) times daily as needed for cough. 11/25/23   Gladis, Mary-Margaret, FNP  doxycycline  (VIBRA -TABS) 100 MG tablet Take 1 tablet (100 mg total) by mouth 2 (two) times daily. 1 po bid 07/26/23   Gladis Mustard, FNP  escitalopram  (LEXAPRO ) 10 MG tablet TAKE 2 TABLETS BY MOUTH DAILY 09/02/23   Gladis Mustard, FNP  meclizine  (ANTIVERT ) 25 MG tablet Take 1 tablet by mouth twice daily 07/01/23   Gladis Mustard, FNP  metoprolol  succinate (TOPROL -XL) 25 MG 24 hr tablet Take 1 tablet (25 mg total) by mouth 2 (two) times daily. 05/26/23    Lucien Orren SAILOR, PA-C  polyethylene glycol (MIRALAX  / GLYCOLAX ) 17 g packet Take 17 g by mouth 2 (two) times daily. 12/14/22   Regalado, Belkys A, MD  senna (SENOKOT) 8.6 MG TABS tablet Take 1 tablet (8.6 mg total) by mouth daily. 12/14/22   Regalado, Belkys A, MD  tamsulosin  (FLOMAX ) 0.4 MG CAPS capsule TAKE 1 CAPSULE BY MOUTH DAILY 10/13/23   Gladis Mustard, FNP      Allergies    Penicillins, Crestor [rosuvastatin], Lipitor [atorvastatin ], and Symbicort  [budesonide -formoterol  fumarate]    Review of Systems   Review of Systems  Gastrointestinal:  Positive for abdominal pain.    Physical Exam Updated Vital Signs BP (!) 149/82   Pulse 71   Temp 97.7 F (36.5 C) (Oral)   Resp 18   Ht 5' 10 (1.778 m)   Wt 83 kg   SpO2 98%   BMI 26.26 kg/m  Physical Exam Vitals and nursing note reviewed.  Constitutional:      General: He is not in acute distress.    Appearance: He is well-developed.  HENT:     Head: Normocephalic and atraumatic.  Eyes:     Extraocular Movements: Extraocular movements intact.     Conjunctiva/sclera: Conjunctivae normal.  Cardiovascular:     Rate and Rhythm: Normal rate and regular rhythm.     Heart sounds: No murmur heard. Pulmonary:  Effort: Pulmonary effort is normal. No respiratory distress.     Breath sounds: Normal breath sounds.  Abdominal:     Palpations: Abdomen is soft.     Tenderness: There is no abdominal tenderness. There is no right CVA tenderness, left CVA tenderness, guarding or rebound.     Hernia: No hernia is present.  Musculoskeletal:        General: No swelling.     Cervical back: Neck supple.  Skin:    General: Skin is warm and dry.     Capillary Refill: Capillary refill takes less than 2 seconds.  Neurological:     Mental Status: He is alert.  Psychiatric:        Mood and Affect: Mood normal.     ED Results / Procedures / Treatments   Labs (all labs ordered are listed, but only abnormal results are displayed) Labs  Reviewed  COMPREHENSIVE METABOLIC PANEL - Abnormal; Notable for the following components:      Result Value   Glucose, Bld 128 (*)    Creatinine, Ser 1.27 (*)    GFR, Estimated 56 (*)    All other components within normal limits  URINALYSIS, ROUTINE W REFLEX MICROSCOPIC - Abnormal; Notable for the following components:   Protein, ur 30 (*)    All other components within normal limits  LIPASE, BLOOD  CBC    EKG EKG Interpretation Date/Time:  Thursday December 23 2023 11:45:56 EST Ventricular Rate:  61 PR Interval:  140 QRS Duration:  72 QT Interval:  428 QTC Calculation: 430 R Axis:   67  Text Interpretation: Normal sinus rhythm Normal ECG When compared with ECG of 24-May-2023 14:02, No significant change was found Confirmed by Patsey Lot [phone]) on 12/23/2023 4:38:56 PM  Radiology No results found.  Procedures Procedures    Medications Ordered in ED Medications  calcium  carbonate (TUMS - dosed in mg elemental calcium ) chewable tablet 200 mg of elemental calcium  (200 mg of elemental calcium  Oral Given 12/23/23 1651)  pantoprazole  (PROTONIX ) EC tablet 40 mg (40 mg Oral Given 12/23/23 1655)    ED Course/ Medical Decision Making/ A&P                                 Medical Decision Making This patient presents to the ED for concern of epigastric pain, this involves an extensive number of treatment options, and is a complaint that carries with it a high risk of complications and morbidity.  The differential diagnosis includes gastritis, esophagitis, esophageal malignancy,    Co morbidities that complicate the patient evaluation :   HTN   Additional history obtained:  Additional history obtained from EMR External records from outside source obtained and reviewed including prior notes   Lab Tests:  I Ordered, and personally interpreted labs.  The pertinent results include: UA negative, CMP shows baseline renal function, lipase is normal, CBC is  normal     Problem List / ED Course / Critical interventions / Medication management  Epigastric discomfort with swallowing, dysphagia-patient feels like food stops around his xiphoid process when he tries to swallow, had this before and states it was due to acid reflux and he had Esophagus stretched, he is asking for prescription for antacids.  I reviewed prior records, side where he had esophageal dilatation in January 2024, patient has reassuring labs and vitals and exam.  He has no abdominal tenderness, I did consider CT scan given  his age but do not feel this would be beneficial at this time.  Patient advised to follow-up closely with GI and come back to the ER if he had new or worsening symptoms.  I have reviewed the patients home medicines and have made adjustments as needed      Amount and/or Complexity of Data Reviewed Labs: ordered.  Risk OTC drugs. Prescription drug management.           Final Clinical Impression(s) / ED Diagnoses Final diagnoses:  Epigastric pain    Rx / DC Orders ED Discharge Orders          Ordered    esomeprazole  (NEXIUM ) 40 MG capsule  Daily        12/23/23 [address] 12/23/23 2227    Patsey Lot, MD 12/24/23 1452

## 2023-12-24 ENCOUNTER — Other Ambulatory Visit: Payer: Self-pay | Admitting: Nurse Practitioner

## 2023-12-24 MED ORDER — MECLIZINE HCL 25 MG PO TABS: 25.0000 mg | ORAL_TABLET | Freq: Two times a day (BID) | ORAL | 1 refills | Status: DC

## 2023-12-24 NOTE — Telephone Encounter (Signed)
 Copied from CRM [id number]. Topic: Clinical - Medication Refill >> Dec 24, 2023  9:39 AM Antonio DEL wrote: Most Recent Primary Care Visit:  Provider: GLADIS MUSTARD  Department: [facility]  Visit Type: ACUTE  Date: 12/09/2023  Medication: meclizine  (ANTIVERT ) 25 MG tablet  Has the patient contacted their pharmacy? Yes (Agent: If no, request that the patient contact the pharmacy for the refill. If patient does not wish to contact the pharmacy document the reason why and proceed with request.) (Agent: If yes, when and what did the pharmacy advise?) there are no refills  Is this the correct pharmacy for this prescription? Yes If no, delete pharmacy and type the correct one.  This is the patient's preferred pharmacy:  [facility] [address] [place] - [address] [place] [address] Phone: [phone] Fax: [phone]  [facility] Delivery - [place], [place] - [address] [place] [address] Phone: [phone] Fax: [phone]   Has the prescription been filled recently? No  Is the patient out of the medication? No  Has the patient been seen for an appointment in the last year OR does the patient have an upcoming appointment? Yes  Can we respond through MyChart? No  Agent: Please be advised that Rx refills may take up to 3 business days. We ask that you follow-up with your pharmacy.

## 2024-01-07 ENCOUNTER — Other Ambulatory Visit: Payer: Self-pay | Admitting: Nurse Practitioner

## 2024-01-07 DIAGNOSIS — N400 Enlarged prostate without lower urinary tract symptoms: Secondary | ICD-10-CM

## 2024-01-07 DIAGNOSIS — F411 Generalized anxiety disorder: Secondary | ICD-10-CM

## 2024-01-07 DIAGNOSIS — K219 Gastro-esophageal reflux disease without esophagitis: Secondary | ICD-10-CM

## 2024-01-14 ENCOUNTER — Ambulatory Visit: Payer: Medicare Other

## 2024-01-14 VITALS — Ht 70.0 in | Wt 182.0 lb

## 2024-01-14 DIAGNOSIS — Z0000 Encounter for general adult medical examination without abnormal findings: Secondary | ICD-10-CM

## 2024-01-14 NOTE — Progress Notes (Signed)
 Subjective:   Francisco [name] is a 83 y.o. who presents for a Medicare Wellness preventive visit.  Visit Complete: Virtual I connected with  Kamdon L Ditmer on 01/14/24 by a audio enabled telemedicine application and verified that I am speaking with the correct person using two identifiers.  Patient Location: Home  Provider Location: Home Office  I discussed the limitations of evaluation and management by telemedicine. The patient expressed understanding and agreed to proceed.  Vital Signs: Because this visit was a virtual/telehealth visit, some criteria may be missing or patient reported. Any vitals not documented were not able to be obtained and vitals that have been documented are patient reported.  VideoDeclined- This patient declined Librarian, academic. Therefore the visit was completed with audio only.  AWV Questionnaire: No: Patient Medicare AWV questionnaire was not completed prior to this visit.  Cardiac Risk Factors include: advanced age (>40men, >30 women);male gender;hypertension;dyslipidemia     Objective:    Today's Vitals   01/14/24 1447  Weight: 182 lb (82.6 kg)  Height: 5\' 10"  (1.778 m)   Body mass index is 26.11 kg/m.     01/14/2024    3:05 PM 12/23/2023   11:43 AM 01/12/2023    2:00 PM 12/07/2022    1:00 PM 12/07/2022   12:59 AM 08/18/2022   11:17 AM 08/12/2022    3:30 AM  Advanced Directives  Does Patient Have a Medical Advance Directive? No No Yes Yes No No No  Type of Best boy of [place];Living will Living will;Out of facility DNR (pink MOST or yellow form)     Does patient want to make changes to medical advance directive?    No - Guardian declined     Copy of Healthcare Power of Attorney in Chart?   No - copy requested      Would patient like information on creating a medical advance directive? Yes (MAU/Ambulatory/Procedural Areas - Information given) No - Patient declined  No - Patient declined No  - Patient declined No - Patient declined No - Patient declined  Pre-existing out of facility DNR order (yellow form or pink MOST form)    Physician notified to receive inpatient order       Current Medications (verified) Outpatient Encounter Medications as of 01/14/2024  Medication Sig   ALPRAZolam (XANAX) 1 MG tablet TAKE 1 TABLET BY MOUTH TWICE  DAILY AS NEEDED FOR ANXIETY   aspirin 81 MG tablet Take 81 mg by mouth daily.   benzonatate (TESSALON) 100 MG capsule Take 1 capsule (100 mg total) by mouth 2 (two) times daily as needed for cough.   doxycycline (VIBRA-TABS) 100 MG tablet Take 1 tablet (100 mg total) by mouth 2 (two) times daily. 1 po bid   escitalopram (LEXAPRO) 10 MG tablet TAKE 2 TABLETS BY MOUTH DAILY   meclizine (ANTIVERT) 25 MG tablet Take 1 tablet (25 mg total) by mouth 2 (two) times daily.   metoprolol succinate (TOPROL-XL) 25 MG 24 hr tablet Take 1 tablet (25 mg total) by mouth 2 (two) times daily.   polyethylene glycol (MIRALAX / GLYCOLAX) 17 g packet Take 17 g by mouth 2 (two) times daily.   senna (SENOKOT) 8.6 MG TABS tablet Take 1 tablet (8.6 mg total) by mouth daily.   tamsulosin (FLOMAX) 0.4 MG CAPS capsule TAKE 1 CAPSULE BY MOUTH DAILY   esomeprazole (NEXIUM) 40 MG capsule Take 1 capsule (40 mg total) by mouth daily for 14 days.  No facility-administered encounter medications on file as of 01/14/2024.    Allergies (verified) Penicillins, Crestor [rosuvastatin], Lipitor [atorvastatin], and Symbicort [budesonide-formoterol fumarate]   History: Past Medical History:  Diagnosis Date   Anxiety    GERD (gastroesophageal reflux disease)    Hypertension    Vertigo    Past Surgical History:  Procedure Laterality Date   AMPUTATION Left 08/05/2016   Procedure: REVISION AMPUTATION LEFT INDEX FINGER REPAIR LACERATION LEFT THUMB;  Surgeon: Bradly Bienenstock, MD;  Location: [facility] OR;  Service: Orthopedics;  Laterality: Left;   BILIARY STENT PLACEMENT N/A 07/09/2022   Procedure:  BILIARY STENT PLACEMENT;  Surgeon: Meryl Dare, MD;  Location: [facility] ENDOSCOPY;  Service: Gastroenterology;  Laterality: N/A;   CHOLECYSTECTOMY N/A 07/05/2022   Procedure: LAPAROSCOPIC CHOLECYSTECTOMY;  Surgeon: Fritzi Mandes, MD;  Location: [facility] ORS;  Service: General;  Laterality: N/A;   ENDOSCOPIC RETROGRADE CHOLANGIOPANCREATOGRAPHY (ERCP) WITH PROPOFOL N/A 12/08/2022   Procedure: ENDOSCOPIC RETROGRADE CHOLANGIOPANCREATOGRAPHY (ERCP) WITH PROPOFOL;  Surgeon: Meryl Dare, MD;  Location: [facility] ENDOSCOPY;  Service: Gastroenterology;  Laterality: N/A;   ERCP N/A 07/09/2022   Procedure: ENDOSCOPIC RETROGRADE CHOLANGIOPANCREATOGRAPHY (ERCP);  Surgeon: Meryl Dare, MD;  Location: [facility];  Service: Gastroenterology;  Laterality: N/A;   ESOPHAGOGASTRODUODENOSCOPY (EGD) WITH PROPOFOL N/A 12/08/2022   Procedure: ESOPHAGOGASTRODUODENOSCOPY (EGD) WITH PROPOFOL;  Surgeon: Napoleon Form, MD;  Location: [facility] ENDOSCOPY;  Service: Gastroenterology;  Laterality: N/A;   POSTERIOR CERVICAL FUSION/FORAMINOTOMY N/A 08/18/2022   Procedure: C4-5 Posterior cervical fusion with lateral mass fixation;  Surgeon: Coletta Memos, MD;  Location: [facility] OR;  Service: Neurosurgery;  Laterality: N/A;   REMOVAL OF STONES  12/08/2022   Procedure: REMOVAL OF STONES;  Surgeon: Meryl Dare, MD;  Location: [facility] ENDOSCOPY;  Service: Gastroenterology;;   Dennison Mascot  07/09/2022   Procedure: SPHINCTEROTOMY;  Surgeon: Meryl Dare, MD;  Location: [facility]OS[facility];  Service: Gastroenterology;;   SPINE SURGERY     STENT REMOVAL  12/08/2022   Procedure: STENT REMOVAL;  Surgeon: Meryl Dare, MD;  Location: [facility] ENDOSCOPY;  Service: Gastroenterology;;   Family History  Problem Relation Age of Onset   Healthy Mother    Transient ischemic attack Father    Rheum arthritis Daughter    Amblyopia Neg Hx    Blindness Neg Hx    Cataracts Neg Hx    Glaucoma Neg Hx    Macular degeneration Neg Hx    Retinal detachment Neg Hx     Strabismus Neg Hx    Retinitis pigmentosa Neg Hx    Social History   Socioeconomic History   Marital status: Widowed    Spouse name: Not on file   Number of children: 2   Years of education: Not on file   Highest education level: Not on file  Occupational History   Occupation: Retired    Associate Professor: [employer]  Tobacco Use   Smoking status: Former    Current packs/day: 0.00    Average packs/day: 1 pack/day for 15.0 years (15.0 ttl pk-yrs)    Types: Cigarettes    Start date: 02/22/1957    Quit date: 02/23/1972    Years since quitting: 51.9   Smokeless tobacco: Never  Vaping Use   Vaping status: Never Used  Substance and Sexual Activity   Alcohol use: Yes    Alcohol/week: 1.0 standard drink of alcohol    Types: 1 Standard drinks or equivalent per week    Comment: occasional beer, very  little   Drug use: No  Sexual activity: Not Currently  Other Topics Concern   Not on file  Social History Narrative   2 daughters nearby   Social Drivers of Health   Financial Resource Strain: Low Risk  (01/14/2024)   Overall Financial Resource Strain (CARDIA)    Difficulty of Paying Living Expenses: Not hard at all  Food Insecurity: No Food Insecurity (01/14/2024)   Hunger Vital Sign    Worried About Running Out of Food in the Last Year: Never true    Ran Out of Food in the Last Year: Never true  Transportation Needs: No Transportation Needs (01/14/2024)   PRAPARE - Administrator, Civil Service (Medical): No    Lack of Transportation (Non-Medical): No  Physical Activity: Insufficiently Active (01/14/2024)   Exercise Vital Sign    Days of Exercise per Week: 5 days    Minutes of Exercise per Session: 10 min  Stress: No Stress Concern Present (01/14/2024)   [employer] of Occupational Health - Occupational Stress Questionnaire    Feeling of Stress : Not at all  Social Connections: Socially Isolated (01/14/2024)   Social Connection and Isolation Panel [NHANES]    Frequency  of Communication with Friends and Family: More than three times a week    Frequency of Social Gatherings with Friends and Family: Three times a week    Attends Religious Services: Never    Active Member of Clubs or Organizations: No    Attends Banker Meetings: Never    Marital Status: Widowed    Tobacco Counseling Counseling given: Not Answered    Clinical Intake:  Pre-visit preparation completed: Yes  Pain : No/denies pain     Diabetes: No  How often do you need to have someone help you when you read instructions, pamphlets, or other written materials from your doctor or pharmacy?: 1 - Never  Interpreter Needed?: No  Information entered by :: Kandis Fantasia LPN   Activities of Daily Living     01/14/2024    2:54 PM  In your present state of health, do you have any difficulty performing the following activities:  Hearing? 0  Vision? 0  Difficulty concentrating or making decisions? 0  Walking or climbing stairs? 0  Dressing or bathing? 0  Doing errands, shopping? 0  Preparing Food and eating ? N  Using the Toilet? N  In the past six months, have you accidently leaked urine? N  Do you have problems with loss of bowel control? N  Managing your Medications? N  Managing your Finances? N  Housekeeping or managing your Housekeeping? N    Patient Care Team: Bennie Pierini, FNP as PCP - General (Nurse Practitioner)  Indicate any recent Medical Services you may have received from other than [facility] providers in the past year (date may be approximate).     Assessment:   This is a routine wellness examination for Mauricio.  Hearing/Vision screen Hearing Screening - Comments:: Denies hearing difficulties   Vision Screening - Comments:: Wears rx glasses - up to date with routine eye exams with Dr. Conley Rolls     Goals Addressed   None    Depression Screen     01/14/2024    3:02 PM 11/25/2023    1:43 PM 08/05/2023    2:07 PM 07/26/2023   10:50 AM  05/06/2023    2:16 PM 05/06/2023    2:00 PM 02/02/2023   11:05 AM  PHQ 2/9 Scores  PHQ - 2 Score 2 4 2 2  1  4 1  PHQ- 9 Score 6 11 6 12 11 9 3     Fall Risk     01/14/2024    3:05 PM 11/25/2023    1:43 PM 07/26/2023   10:50 AM 05/06/2023    2:16 PM 05/06/2023    2:00 PM  Fall Risk   Falls in the past year? 0 0 1 1 0  Number falls in past yr: 0  0 1   Injury with Fall? 0  0 0   Risk for fall due to : No Fall Risks;History of fall(s);Impaired balance/gait;Impaired mobility   History of fall(s)   Follow up Falls prevention discussed;Education provided;Falls evaluation completed   Education provided     MEDICARE RISK AT HOME:  Medicare Risk at Home Any stairs in or around the home?: No If so, are there any without handrails?: No Home free of loose throw rugs in walkways, pet beds, electrical cords, etc?: Yes Adequate lighting in your home to reduce risk of falls?: Yes Life alert?: No Use of a cane, walker or w/c?: Yes Grab bars in the bathroom?: Yes Shower chair or bench in shower?: Yes Elevated toilet seat or a handicapped toilet?: Yes  TIMED UP AND GO:  Was the test performed?  No  Cognitive Function: 6CIT completed    09/14/2017    9:24 AM  MMSE - Mini Mental State Exam  Orientation to time 3  Orientation to Place 5  Registration 3  Attention/ Calculation 5  Recall 2  Language- name 2 objects 2  Language- repeat 1  Language- follow 3 step command 3  Language- read & follow direction 1  Write a sentence 1  Copy design 0  Total score 26        01/14/2024    3:06 PM 01/12/2023    2:01 PM 08/08/2021   11:26 AM 09/22/2018   11:07 AM  6CIT Screen  What Year? 0 points 0 points 0 points 0 points  What month? 0 points 0 points 0 points 0 points  What time? 0 points 0 points 0 points 0 points  Count back from 20 0 points 0 points 0 points 0 points  Months in reverse 2 points 0 points 2 points 2 points  Repeat phrase 2 points 2 points 2 points 0 points  Total Score 4  points 2 points 4 points 2 points    Immunizations Immunization History  Administered Date(s) Administered   Fluad Quad(high Dose 65+) 08/01/2019, 09/12/2020, 09/04/2021, 08/14/2022   Fluad Trivalent(High Dose 65+) 08/05/2023   Influenza, High Dose Seasonal PF 09/10/2016, 09/02/2018   Influenza,inj,Quad PF,6+ Mos 08/28/2013, 09/03/2014, 08/09/2017   Influenza-Unspecified 12/03/2015   Moderna Covid-19 Vaccine Bivalent Booster 39yrs & up 10/20/2021   Moderna SARS-COV2 Booster Vaccination 10/16/2020   Moderna Sars-Covid-2 Vaccination 12/09/2019, 01/06/2020   PNEUMOCOCCAL CONJUGATE-20 05/03/2023   Pneumococcal Conjugate-13 10/18/2013   Pneumococcal Polysaccharide-23 06/16/2015   Td 08/15/2021   Tdap 08/05/2016, 08/15/2021    Screening Tests Health Maintenance  Topic Date Due   Zoster Vaccines- Shingrix (1 of 2) Never done   COVID-19 Vaccine (4 - 2024-25 season) 07/18/2023   Medicare Annual Wellness (AWV)  01/13/2025   DTaP/Tdap/Td (4 - Td or Tdap) 08/16/2031   Pneumonia Vaccine 21+ Years old  Completed   INFLUENZA VACCINE  Completed   HPV VACCINES  Aged Out   Hepatitis C Screening  Discontinued    Health Maintenance  Health Maintenance Due  Topic Date Due   Zoster Vaccines- Shingrix (1  of 2) Never done   COVID-19 Vaccine (4 - 2024-25 season) 07/18/2023    Additional Screening:  Vision Screening: Recommended annual ophthalmology exams for early detection of glaucoma and other disorders of the eye.  Dental Screening: Recommended annual dental exams for proper oral hygiene  Community Resource Referral / Chronic Care Management: CRR required this visit?  No   CCM required this visit?  No     Plan:     I have personally reviewed and noted the following in the patient's chart:   Medical and social history Use of alcohol, tobacco or illicit drugs  Current medications and supplements including opioid prescriptions. Patient is not currently taking opioid  prescriptions. Functional ability and status Nutritional status Physical activity Advanced directives List of other physicians Hospitalizations, surgeries, and ER visits in previous 12 months Vitals Screenings to include cognitive, depression, and falls Referrals and appointments  In addition, I have reviewed and discussed with patient certain preventive protocols, quality metrics, and best practice recommendations. A written personalized care plan for preventive services as well as general preventive health recommendations were provided to patient.     Kandis Fantasia [place], [place]   04/03/8415   After Visit Summary: (Declined) Due to this being a telephonic visit, with patients personalized plan was offered to patient but patient Declined AVS at this time   Notes: Nothing significant to report at this time.

## 2024-01-14 NOTE — Patient Instructions (Signed)
 Francisco [name] , Thank you for taking time to come for your Medicare Wellness Visit. I appreciate your ongoing commitment to your health goals. Please review the following plan we discussed and let me know if I can assist you in the future.   Referrals/Orders/Follow-Ups/Clinician Recommendations: Aim for 30 minutes of exercise or brisk walking, 6-8 glasses of water, and 5 servings of fruits and vegetables each day.  This is a list of the screening recommended for you and due dates:  Health Maintenance  Topic Date Due   Zoster (Shingles) Vaccine (1 of 2) Never done   COVID-19 Vaccine (4 - 2024-25 season) 07/18/2023   Medicare Annual Wellness Visit  01/13/2025   DTaP/Tdap/Td vaccine (4 - Td or Tdap) 08/16/2031   Pneumonia Vaccine  Completed   Flu Shot  Completed   HPV Vaccine  Aged Out   Hepatitis C Screening  Discontinued    Advanced directives: (ACP Link)Information on Advanced Care Planning can be found at [facility] [place] Advance Health Care Directives Advance Health Care Directives ([email or web address]   Next Medicare Annual Wellness Visit scheduled for next year: Yes

## 2024-01-21 DIAGNOSIS — H2513 Age-related nuclear cataract, bilateral: Secondary | ICD-10-CM | POA: Diagnosis not present

## 2024-01-21 DIAGNOSIS — H40033 Anatomical narrow angle, bilateral: Secondary | ICD-10-CM | POA: Diagnosis not present

## 2024-01-31 ENCOUNTER — Encounter: Payer: Self-pay | Admitting: Nurse Practitioner

## 2024-01-31 ENCOUNTER — Ambulatory Visit (INDEPENDENT_AMBULATORY_CARE_PROVIDER_SITE_OTHER): Payer: Medicare Other | Admitting: Nurse Practitioner

## 2024-01-31 VITALS — BP 136/70 | HR 74 | Temp 99.7°F | Ht 70.0 in | Wt 181.2 lb

## 2024-01-31 DIAGNOSIS — N400 Enlarged prostate without lower urinary tract symptoms: Secondary | ICD-10-CM

## 2024-01-31 DIAGNOSIS — E782 Mixed hyperlipidemia: Secondary | ICD-10-CM | POA: Diagnosis not present

## 2024-01-31 DIAGNOSIS — N1831 Chronic kidney disease, stage 3a: Secondary | ICD-10-CM | POA: Diagnosis not present

## 2024-01-31 DIAGNOSIS — R42 Dizziness and giddiness: Secondary | ICD-10-CM

## 2024-01-31 DIAGNOSIS — I10 Essential (primary) hypertension: Secondary | ICD-10-CM

## 2024-01-31 DIAGNOSIS — D696 Thrombocytopenia, unspecified: Secondary | ICD-10-CM | POA: Diagnosis not present

## 2024-01-31 DIAGNOSIS — R627 Adult failure to thrive: Secondary | ICD-10-CM

## 2024-01-31 DIAGNOSIS — K219 Gastro-esophageal reflux disease without esophagitis: Secondary | ICD-10-CM

## 2024-01-31 DIAGNOSIS — D649 Anemia, unspecified: Secondary | ICD-10-CM

## 2024-01-31 DIAGNOSIS — F411 Generalized anxiety disorder: Secondary | ICD-10-CM

## 2024-01-31 MED ORDER — TAMSULOSIN HCL 0.4 MG PO CAPS: 0.4000 mg | ORAL_CAPSULE | Freq: Every day | ORAL | 1 refills | Status: DC

## 2024-01-31 MED ORDER — MECLIZINE HCL 25 MG PO TABS: 25.0000 mg | ORAL_TABLET | Freq: Two times a day (BID) | ORAL | 1 refills | Status: DC

## 2024-01-31 MED ORDER — ALPRAZOLAM 1 MG PO TABS: 1.0000 mg | ORAL_TABLET | Freq: Two times a day (BID) | ORAL | 2 refills | Status: DC | PRN

## 2024-01-31 MED ORDER — ESCITALOPRAM OXALATE 10 MG PO TABS: 20.0000 mg | ORAL_TABLET | Freq: Every day | ORAL | 1 refills | Status: DC

## 2024-01-31 NOTE — Patient Instructions (Signed)
 Vertigo Vertigo is the feeling that you or the things around you are moving or spinning when they're not. It's different than feeling dizzy. It can also cause: Loss of balance. Trouble standing or walking. Nausea and vomiting. This feeling can come and go at any time. It can last from a few seconds to minutes or even hours. It may go away on its own or be treated with medicine. What are the types of vertigo? There are two types of vertigo: Peripheral vertigo happens when parts of your inner ear don't work like they should. This is the more common type. Central vertigo happens when your brain and spinal cord don't work like they should. Your health care provider will do tests to find out what kind of vertigo you have. This will help them decide on the right treatment for you. Follow these instructions at home: Eating and drinking Drink enough fluid to keep your pee (urine) pale yellow. Do not drink alcohol. Activity When you get up in the morning, first sit up on the side of the bed. When you feel okay, stand slowly while holding onto something. Move slowly. Avoid sudden body or head movements. Avoid certain positions, as told by your provider. Use a cane if you have trouble standing or walking. Sit down right away if you feel unsteady. Place items in your home so they're easy for you to reach without bending or leaning over. Return to normal activities when you're told. Ask what things are safe for you to do. General instructions Take your medicines only as told by your provider. Contact a health care provider if: Your medicines don't help or make your vertigo worse. You get new symptoms. You have a fever. You have nausea or vomiting. Your family or friends spot any changes in how you're acting. A part of your body goes numb. You feel tingling and prickling in a part of your body. You get very bad headaches. Get help right away if: You're always dizzy or you faint. You have a  stiff neck. You have trouble moving or speaking. Your hands, arms, or legs feel weak. Your hearing or eyesight changes. These symptoms may be an emergency. Call 911 right away. Do not wait to see if the symptoms will go away. Do not drive yourself to the hospital. This information is not intended to replace advice given to you by your health care provider. Make sure you discuss any questions you have with your health care provider. Document Revised: 08/05/2023 Document Reviewed: 02/05/2023 Elsevier Patient Education  2024 [employer].

## 2024-01-31 NOTE — Addendum Note (Signed)
 Addended by: Bennie Pierini on: 01/31/2024 03:17 PM   Modules accepted: Orders

## 2024-01-31 NOTE — Progress Notes (Signed)
 Subjective:    Patient ID: Francisco [name], male    DOB: 1941/09/17, 83 y.o.   MRN: [id number]   Chief Complaint: Medical Management of Chronic Issues (Weak legs. Tick bite on left arm 01/26/2024. Head feels like he's dizzy or "drunk")    HPI:  Francisco [name] is a 83 y.o. who identifies as a male who was assigned male at birth.   Social history: Lives with: by himself Work history: retired   Water engineer in today for follow up of the following chronic medical issues:  1. Essential hypertension No c/o chest pain, sob or headache. Does not check blood pressure at home. BP Readings from Last 3 Encounters:  12/23/23 (!) 149/82  12/09/23 130/77  11/25/23 (!) 89/57     2. Mixed hyperlipidemia Does not watch diet and does no dedicated exercise. Lab Results  Component Value Date   CHOL 142 08/05/2023   HDL 42 08/05/2023   LDLCALC 66 08/05/2023   TRIG 205 (H) 08/05/2023   CHOLHDL 3.4 08/05/2023     3. GERD without esophagitis Is on protonix and is doing well.  4. Chronic kidney disease, stage 3a (HCC) No voiding issues. No edema Lab Results  Component Value Date   CREATININE 1.27 (H) 12/23/2023     5. Thrombocytopenia (HCC) Lab Results  Component Value Date   PLT 154 12/23/2023     6. Benign prostatic hyperplasia, unspecified whether lower urinary tract symptoms present No voiding issues. Lab Results  Component Value Date   PSA1 1.4 05/06/2023   PSA1 2.2 04/09/2020   PSA1 1.3 02/09/2018   PSA 1.2 12/27/2014   PSA 1.0 07/14/2013      7. Normocytic anemia Says he feels tired all the time. Is very inactive. Lab Results  Component Value Date   HGB 14.6 12/23/2023     8.  GAD Is on lexapro and xanax and is  doing ok.     11/25/2023    1:43 PM 08/05/2023    2:08 PM 07/26/2023   10:51 AM 05/06/2023    2:17 PM  GAD 7 : Generalized Anxiety Score  Nervous, Anxious, on Edge 1 0 0 0  Control/stop worrying 0 1 1 1   Worry too much - different things 1 1 1 1    Trouble relaxing 1 0 0 1  Restless 0 0 1 3  Easily annoyed or irritable 0 0 1 1  Afraid - awful might happen 1 1 1  0  Total GAD 7 Score 4 3 5 7   Anxiety Difficulty Not difficult at all Somewhat difficult Somewhat difficult Somewhat difficult       9. Failure to thrive in adult No recent weight changes Wt Readings from Last 3 Encounters:  01/14/24 182 lb (82.6 kg)  12/23/23 182 lb 15.7 oz (83 kg)  12/09/23 183 lb (83 kg)   BMI Readings from Last 3 Encounters:  01/14/24 26.11 kg/m  12/23/23 26.26 kg/m  12/09/23 26.26 kg/m      New complaints: Still having vertigo when he goes from sitting to standing. He has been taking some OTC vitamins that re suppose to be for vertigo and that has helped.  Allergies  Allergen Reactions   Penicillins Anaphylaxis    REACTION: swelling/hives  Has patient had a PCN reaction causing immediate rash, facial/tongue/throat swelling, SOB or lightheadedness with hypotension:yes  Has patient had a PCN reaction causing severe rash involving mucus membranes or skin necrosis: Yes  Has patient had a PCN reaction that required  hospitalization No  Has patient had a PCN reaction occurring within the last 10 years: No  If all of the above answers are "NO", then may proceed with Cephalosporin use.  REACTION: swelling/hives, Has patient had a PCN reaction causing immediate rash, facial/tongue/throat swelling, SOB or lightheadedness with hypotension:yes, Has patient had a PCN reaction causing severe rash involving mucus membranes or skin necrosis: Yes, Has patient had a PCN reaction that required hospitalization No, Has patient had a PCN reaction occurring within the last 10 years: No, If all of the above answers are "NO", then may proceed with Cephalosporin use.   Crestor [Rosuvastatin] Other (See Comments)    Unknown per Pt   Lipitor [Atorvastatin] Other (See Comments)    Unknown per Pt   Symbicort [Budesonide-Formoterol Fumarate] Other (See  Comments)    Pain Behind ribs Unknown per Pt   Outpatient Encounter Medications as of 01/31/2024  Medication Sig   ALPRAZolam (XANAX) 1 MG tablet TAKE 1 TABLET BY MOUTH TWICE  DAILY AS NEEDED FOR ANXIETY   aspirin 81 MG tablet Take 81 mg by mouth daily.   benzonatate (TESSALON) 100 MG capsule Take 1 capsule (100 mg total) by mouth 2 (two) times daily as needed for cough.   doxycycline (VIBRA-TABS) 100 MG tablet Take 1 tablet (100 mg total) by mouth 2 (two) times daily. 1 po bid   escitalopram (LEXAPRO) 10 MG tablet TAKE 2 TABLETS BY MOUTH DAILY   esomeprazole (NEXIUM) 40 MG capsule Take 1 capsule (40 mg total) by mouth daily for 14 days.   meclizine (ANTIVERT) 25 MG tablet Take 1 tablet (25 mg total) by mouth 2 (two) times daily.   metoprolol succinate (TOPROL-XL) 25 MG 24 hr tablet Take 1 tablet (25 mg total) by mouth 2 (two) times daily.   polyethylene glycol (MIRALAX / GLYCOLAX) 17 g packet Take 17 g by mouth 2 (two) times daily.   senna (SENOKOT) 8.6 MG TABS tablet Take 1 tablet (8.6 mg total) by mouth daily.   tamsulosin (FLOMAX) 0.4 MG CAPS capsule TAKE 1 CAPSULE BY MOUTH DAILY   No facility-administered encounter medications on file as of 01/31/2024.    Past Surgical History:  Procedure Laterality Date   AMPUTATION Left 08/05/2016   Procedure: REVISION AMPUTATION LEFT INDEX FINGER REPAIR LACERATION LEFT THUMB;  Surgeon: Bradly Bienenstock, MD;  Location: [facility] OR;  Service: Orthopedics;  Laterality: Left;   BILIARY STENT PLACEMENT N/A 07/09/2022   Procedure: BILIARY STENT PLACEMENT;  Surgeon: Meryl Dare, MD;  Location: [facility] ENDOSCOPY;  Service: Gastroenterology;  Laterality: N/A;   CHOLECYSTECTOMY N/A 07/05/2022   Procedure: LAPAROSCOPIC CHOLECYSTECTOMY;  Surgeon: Fritzi Mandes, MD;  Location: [facility] ORS;  Service: General;  Laterality: N/A;   ENDOSCOPIC RETROGRADE CHOLANGIOPANCREATOGRAPHY (ERCP) WITH PROPOFOL N/A 12/08/2022   Procedure: ENDOSCOPIC RETROGRADE CHOLANGIOPANCREATOGRAPHY (ERCP)  WITH PROPOFOL;  Surgeon: Meryl Dare, MD;  Location: [facility] ENDOSCOPY;  Service: Gastroenterology;  Laterality: N/A;   ERCP N/A 07/09/2022   Procedure: ENDOSCOPIC RETROGRADE CHOLANGIOPANCREATOGRAPHY (ERCP);  Surgeon: Meryl Dare, MD;  Location: [facility];  Service: Gastroenterology;  Laterality: N/A;   ESOPHAGOGASTRODUODENOSCOPY (EGD) WITH PROPOFOL N/A 12/08/2022   Procedure: ESOPHAGOGASTRODUODENOSCOPY (EGD) WITH PROPOFOL;  Surgeon: Napoleon Form, MD;  Location: [facility];  Service: Gastroenterology;  Laterality: N/A;   POSTERIOR CERVICAL FUSION/FORAMINOTOMY N/A 08/18/2022   Procedure: C4-5 Posterior cervical fusion with lateral mass fixation;  Surgeon: Coletta Memos, MD;  [facility] OR;  Service: Neurosurgery;  Laterality: N/A;   REMOVAL OF STONES  12/08/2022  Procedure: REMOVAL OF STONES;  Surgeon: Meryl Dare, MD;  Location: [facility] ENDOSCOPY;  Service: Gastroenterology;;   Dennison Mascot  07/09/2022   Procedure: SPHINCTEROTOMY;  Surgeon: Meryl Dare, MD;  Location: [facility];  Service: Gastroenterology;;   SPINE SURGERY     STENT REMOVAL  12/08/2022   Procedure: STENT REMOVAL;  Surgeon: Meryl Dare, MD;  Location: [facility] ENDOSCOPY;  Service: Gastroenterology;;    Family History  Problem Relation Age of Onset   Healthy Mother    Transient ischemic attack Father    Rheum arthritis Daughter    Amblyopia Neg Hx    Blindness Neg Hx    Cataracts Neg Hx    Glaucoma Neg Hx    Macular degeneration Neg Hx    Retinal detachment Neg Hx    Strabismus Neg Hx    Retinitis pigmentosa Neg Hx       Controlled substance contract: n/a      Review of Systems  Constitutional:  Negative for diaphoresis.  Eyes:  Negative for pain.  Respiratory:  Negative for shortness of breath.   Cardiovascular:  Negative for chest pain, palpitations and leg swelling.  Gastrointestinal:  Negative for abdominal pain.  Endocrine: Negative for polydipsia.  Skin:  Negative for rash.   Neurological:  Negative for dizziness, weakness and headaches.  Hematological:  Does not bruise/bleed easily.  All other systems reviewed and are negative.      Objective:   Physical Exam Vitals and nursing note reviewed.  Constitutional:      Appearance: Normal appearance. He is well-developed.  HENT:     Head: Normocephalic.     Nose: Nose normal.     Mouth/Throat:     Mouth: Mucous membranes are moist.     Pharynx: Oropharynx is clear.  Eyes:     Pupils: Pupils are equal, round, and reactive to light.  Neck:     Thyroid: No thyroid mass or thyromegaly.     Vascular: No carotid bruit or JVD.     Trachea: Phonation normal.  Cardiovascular:     Rate and Rhythm: Normal rate and regular rhythm.  Pulmonary:     Effort: Pulmonary effort is normal. No respiratory distress.     Breath sounds: Normal breath sounds.  Abdominal:     General: Bowel sounds are normal.     Palpations: Abdomen is soft.     Tenderness: There is no abdominal tenderness.  Musculoskeletal:        General: Normal range of motion.     Cervical back: Normal range of motion and neck supple.     Comments: Gait slow and steady  Lymphadenopathy:     Cervical: No cervical adenopathy.  Skin:    General: Skin is warm and dry.  Neurological:     Mental Status: He is alert and oriented to person, place, and time.  Psychiatric:        Behavior: Behavior normal.        Thought Content: Thought content normal.        Judgment: Judgment normal.    BP 136/70   Pulse 74   Temp 99.7 F (37.6 C)   Ht 5\' 10"  (1.778 m)   Wt 181 lb 3.2 oz (82.2 kg)   SpO2 96%   BMI 26.00 kg/m          Assessment & Plan:   Francisco [name] comes in today with chief complaint of Medical Management of Chronic Issues (Weak legs. Tick bite on  left arm 01/26/2024. Head feels like he's dizzy or "drunk")   Diagnosis and orders addressed:  1. Essential hypertension Low sodium diet - CBC with Differential/Platelet -  CMP14+EGFR  2. Mixed hyperlipidemia Low fta diet - Lipid panel  3. GERD without esophagitis Avoid spicy foods Do not eat 2 hours prior to bedtime - pantoprazole (PROTONIX) 40 MG tablet; Take 1 tablet (40 mg total) by mouth daily.  Dispense: 90 tablet; Refill: 1  4. Chronic kidney disease, stage 3a (HCC) Labs pending  5. Thrombocytopenia (HCC) Labs pending  6. Benign prostatic hyperplasia, unspecified whether lower urinary tract symptoms present Report any voiding issues - tamsulosin (FLOMAX) 0.4 MG CAPS capsule; Take 1 capsule (0.4 mg total) by mouth daily.  Dispense: 100 capsule; Refill: 0  7. Normocytic anemia Labs pending  8. Failure to thrive in adult  9. GAD (generalized anxiety disorder) Stress management - ToxASSURE Select 13 (MW), Urine - ALPRAZolam (XANAX) 1 MG tablet; Take 1 tablet (1 mg total) by mouth 2 (two) times daily as needed for sleep or anxiety.  Dispense: 60 tablet; Refill: 3   Labs pending Health Maintenance reviewed Diet and exercise encouraged  Follow up plan: 6 months   Mary-Margaret Daphine Deutscher, FNP

## 2024-02-03 ENCOUNTER — Other Ambulatory Visit: Payer: Self-pay | Admitting: Physician Assistant

## 2024-02-07 ENCOUNTER — Telehealth: Payer: Self-pay | Admitting: Nurse Practitioner

## 2024-02-07 ENCOUNTER — Other Ambulatory Visit: Payer: Self-pay

## 2024-02-07 MED ORDER — SIMVASTATIN 40 MG PO TABS: 40.0000 mg | ORAL_TABLET | Freq: Every day | ORAL | 0 refills | Status: DC

## 2024-02-07 NOTE — Telephone Encounter (Signed)
 Refill sent to mail order per patients request

## 2024-02-07 NOTE — Telephone Encounter (Signed)
 Copied from CRM [id number]. Topic: Clinical - Prescription Issue >> Feb 07, 2024  2:13 PM Marland Kitchen D wrote: Patient needs a refill for his cholesterol medicine it says it has no more refills  Preferred pharmacy- [facility] - [place], [place] - [address] [place] [address] Phone: [phone] Fax: [phone] Hours: Not open 24 hours

## 2024-03-12 ENCOUNTER — Other Ambulatory Visit: Payer: Self-pay | Admitting: Nurse Practitioner

## 2024-03-12 DIAGNOSIS — K219 Gastro-esophageal reflux disease without esophagitis: Secondary | ICD-10-CM

## 2024-03-13 ENCOUNTER — Other Ambulatory Visit: Payer: Self-pay | Admitting: Nurse Practitioner

## 2024-03-13 DIAGNOSIS — I10 Essential (primary) hypertension: Secondary | ICD-10-CM

## 2024-03-19 ENCOUNTER — Other Ambulatory Visit: Payer: Self-pay | Admitting: Nurse Practitioner

## 2024-03-19 DIAGNOSIS — F411 Generalized anxiety disorder: Secondary | ICD-10-CM

## 2024-04-02 ENCOUNTER — Other Ambulatory Visit: Payer: Self-pay | Admitting: Nurse Practitioner

## 2024-04-12 ENCOUNTER — Other Ambulatory Visit: Payer: Self-pay | Admitting: *Deleted

## 2024-04-12 DIAGNOSIS — I10 Essential (primary) hypertension: Secondary | ICD-10-CM

## 2024-04-17 ENCOUNTER — Telehealth: Payer: Self-pay | Admitting: Nurse Practitioner

## 2024-04-17 MED ORDER — FUROSEMIDE 20 MG PO TABS: 20.0000 mg | ORAL_TABLET | Freq: Every day | ORAL | 0 refills | Status: DC

## 2024-04-17 NOTE — Telephone Encounter (Signed)
 Last Fill: 08/18/22 not on current med list  Last OV: 01/31/24 Next OV: 08/03/24  Routing to provider for review/authorization.

## 2024-04-17 NOTE — Telephone Encounter (Signed)
 Copied from CRM [id number]. Topic: Clinical - Medication Refill >> Apr 17, 2024  9:51 AM Corin V wrote: Medication: furosemide  20mg   Has the patient contacted their pharmacy? Yes (Agent: If no, request that the patient contact the pharmacy for the refill. If patient does not wish to contact the pharmacy document the reason why and proceed with request.) (Agent: If yes, when and what did the pharmacy advise?) [facility] prescription stated they have been reaching out to us   This is the patient's preferred pharmacy:  [facility] - [place], [place] - [address] [place] [address] Phone: [phone] Fax: [phone]  Is this the correct pharmacy for this prescription? Yes If no, delete pharmacy and type the correct one.   Has the prescription been filled recently? No  Is the patient out of the medication? Yes  Has the patient been seen for an appointment in the last year OR does the patient have an upcoming appointment? Yes  Can we respond through MyChart? No  Agent: Please be advised that Rx refills may take up to 3 business days. We ask that you follow-up with your pharmacy.

## 2024-04-24 ENCOUNTER — Other Ambulatory Visit: Payer: Self-pay | Admitting: Nurse Practitioner

## 2024-04-24 DIAGNOSIS — F411 Generalized anxiety disorder: Secondary | ICD-10-CM

## 2024-04-24 MED ORDER — ALPRAZOLAM 1 MG PO TABS: 1.0000 mg | ORAL_TABLET | Freq: Two times a day (BID) | ORAL | 0 refills | Status: DC | PRN

## 2024-04-24 NOTE — Telephone Encounter (Unsigned)
 Copied from CRM [id number]. Topic: Clinical - Medication Refill >> Apr 24, 2024  9:16 AM Alysia Jumbo S wrote: Medication: ALPRAZolam  (XANAX ) 1 MG tablet  Has the patient contacted their pharmacy? Yes, no refills (Agent: If no, request that the patient contact the pharmacy for the refill. If patient does not wish to contact the pharmacy document the reason why and proceed with request.) (Agent: If yes, when and what did the pharmacy advise?)  This is the patient's preferred pharmacy:  [facility] - [place], [place] - [address] [place] [address] Phone: [phone] Fax: [phone]    Is this the correct pharmacy for this prescription? Yes If no, delete pharmacy and type the correct one.   Has the prescription been filled recently? No  Is the patient out of the medication? Yes  Has the patient been seen for an appointment in the last year OR does the patient have an upcoming appointment? Yes  Can we respond through MyChart? No  Agent: Please be advised that Rx refills may take up to 3 business days. We ask that you follow-up with your pharmacy.

## 2024-04-26 ENCOUNTER — Other Ambulatory Visit: Payer: Self-pay | Admitting: *Deleted

## 2024-04-26 MED ORDER — FUROSEMIDE 20 MG PO TABS: 20.0000 mg | ORAL_TABLET | Freq: Every day | ORAL | 0 refills | Status: DC

## 2024-05-03 ENCOUNTER — Encounter: Payer: Self-pay | Admitting: *Deleted

## 2024-05-23 ENCOUNTER — Other Ambulatory Visit: Payer: Self-pay | Admitting: Nurse Practitioner

## 2024-05-23 DIAGNOSIS — F411 Generalized anxiety disorder: Secondary | ICD-10-CM

## 2024-05-23 MED ORDER — ALPRAZOLAM 1 MG PO TABS: 1.0000 mg | ORAL_TABLET | Freq: Two times a day (BID) | ORAL | 0 refills | Status: DC | PRN

## 2024-05-23 NOTE — Telephone Encounter (Signed)
 Copied from CRM [id number]. Topic: Clinical - Medication Refill >> May 23, 2024  9:24 AM Carrielelia G wrote: Medication: ALPRAZolam  (XANAX ) 1 MG tablet [  Has the patient contacted their pharmacy? Yes (Agent: If no, request that the patient contact the pharmacy for the refill. If patient does not wish to contact the pharmacy document the reason why and proceed with request.) (Agent: If yes, when and what did the pharmacy advise?)  This is the patient's preferred pharmacy:  [facility] - [place], [place] - [address] [place] [address] Phone: [phone] Fax: [phone]  Is this the correct pharmacy for this prescription? Yes If no, delete pharmacy and type the correct one.      Is the patient out of the medication? No  (1 day left)   Has the patient been seen for an appointment in the last year OR does the patient have an upcoming appointment? Yes  Can we respond through MyChart? No  Agent: Please be advised that Rx refills may take up to 3 business days. We ask that you follow-up with your pharmacy.

## 2024-06-23 ENCOUNTER — Other Ambulatory Visit: Payer: Self-pay | Admitting: Nurse Practitioner

## 2024-06-23 DIAGNOSIS — F411 Generalized anxiety disorder: Secondary | ICD-10-CM

## 2024-06-23 MED ORDER — ALPRAZOLAM 1 MG PO TABS
1.0000 mg | ORAL_TABLET | Freq: Two times a day (BID) | ORAL | 0 refills | Status: DC | PRN
Start: 2024-06-23 — End: 2024-07-31

## 2024-06-23 NOTE — Telephone Encounter (Signed)
 Duplicate request, closing encounter.

## 2024-06-23 NOTE — Telephone Encounter (Unsigned)
 Copied from CRM [id number]. Topic: Clinical - Medication Refill >> Jun 23, 2024 10:09 AM Mia F wrote: Medication: ALPRAZolam  (XANAX ) 1 MG tablet   Has the patient contacted their pharmacy? No (Agent: If no, request that the patient contact the pharmacy for the refill. If patient does not wish to contact the pharmacy document the reason why and proceed with request.) (Agent: If yes, when and what did the pharmacy advise?)  This is the patient's preferred pharmacy:  [facility] - [place], [place] - [address] [place] [address] Phone: [phone] Fax: [phone]  Is this the correct pharmacy for this prescription? Yes If no, delete pharmacy and type the correct one.   Has the prescription been filled recently? Yes  Is the patient out of the medication? No  Has the patient been seen for an appointment in the last year OR does the patient have an upcoming appointment? Yes  Can we respond through MyChart? No  Agent: Please be advised that Rx refills may take up to 3 business days. We ask that you follow-up with your pharmacy.

## 2024-06-23 NOTE — Telephone Encounter (Unsigned)
 Copied from CRM [id number]. Topic: Clinical - Medication Refill >> Jun 23, 2024 10:09 AM Mia F wrote: Medication: ALPRAZolam  (XANAX ) 1 MG tablet   Has the patient contacted their pharmacy? No (Agent: If no, request that the patient contact the pharmacy for the refill. If patient does not wish to contact the pharmacy document the reason why and proceed with request.) (Agent: If yes, when and what did the pharmacy advise?)  This is the patient's preferred pharmacy:  [facility] - [place], [place] - [address] [place] [address] Phone: [phone] Fax: [phone]  Is this the correct pharmacy for this prescription? Yes If no, delete pharmacy and type the correct one.   Has the prescription been filled recently? Yes  Is the patient out of the medication? No  Has the patient been seen for an appointment in the last year OR does the patient have an upcoming appointment? Yes  Can we respond through MyChart? {yes/no:[id number]}  Agent: Please be advised that Rx refills may take up to 3 business days. We ask that you follow-up with your pharmacy.

## 2024-07-08 ENCOUNTER — Other Ambulatory Visit: Payer: Self-pay | Admitting: Nurse Practitioner

## 2024-07-08 DIAGNOSIS — F411 Generalized anxiety disorder: Secondary | ICD-10-CM

## 2024-07-10 ENCOUNTER — Other Ambulatory Visit: Payer: Self-pay | Admitting: Physician Assistant

## 2024-07-24 ENCOUNTER — Other Ambulatory Visit: Payer: Self-pay | Admitting: Nurse Practitioner

## 2024-07-24 DIAGNOSIS — F411 Generalized anxiety disorder: Secondary | ICD-10-CM

## 2024-07-24 NOTE — Telephone Encounter (Unsigned)
 Copied from CRM [id number]. Topic: Clinical - Medication Refill >> Jul 24, 2024  9:55 AM Tinnie C wrote: Medication: ALPRAZolam  (XANAX ) 1 MG tablet  Has the patient contacted their pharmacy? Yes (Agent: If no, request that the patient contact the pharmacy for the refill. If patient does not wish to contact the pharmacy document the reason why and proceed with request.) (Agent: If yes, when and what did the pharmacy advise?)  Needs dr approval.  This is the patient's preferred pharmacy:  [facility] [address], [place] - [address] [place] [address] Phone: [phone] Fax: [phone]  Is this the correct pharmacy for this prescription? Yes If no, delete pharmacy and type the correct one.   Has the prescription been filled recently? Yes  Is the patient out of the medication? No,1 day  Has the patient been seen for an appointment in the last year OR does the patient have an upcoming appointment? Yes  Can we respond through MyChart? No  Agent: Please be advised that Rx refills may take up to 3 business days. We ask that you follow-up with your pharmacy.

## 2024-07-31 ENCOUNTER — Ambulatory Visit (INDEPENDENT_AMBULATORY_CARE_PROVIDER_SITE_OTHER): Admitting: Nurse Practitioner

## 2024-07-31 ENCOUNTER — Encounter: Payer: Self-pay | Admitting: Nurse Practitioner

## 2024-07-31 VITALS — BP 147/82 | HR 65 | Temp 97.9°F | Ht 70.0 in | Wt 195.0 lb

## 2024-07-31 DIAGNOSIS — E782 Mixed hyperlipidemia: Secondary | ICD-10-CM | POA: Diagnosis not present

## 2024-07-31 DIAGNOSIS — N400 Enlarged prostate without lower urinary tract symptoms: Secondary | ICD-10-CM

## 2024-07-31 DIAGNOSIS — K219 Gastro-esophageal reflux disease without esophagitis: Secondary | ICD-10-CM

## 2024-07-31 DIAGNOSIS — I10 Essential (primary) hypertension: Secondary | ICD-10-CM

## 2024-07-31 DIAGNOSIS — D696 Thrombocytopenia, unspecified: Secondary | ICD-10-CM

## 2024-07-31 DIAGNOSIS — R627 Adult failure to thrive: Secondary | ICD-10-CM | POA: Diagnosis not present

## 2024-07-31 DIAGNOSIS — D649 Anemia, unspecified: Secondary | ICD-10-CM

## 2024-07-31 DIAGNOSIS — Z23 Encounter for immunization: Secondary | ICD-10-CM

## 2024-07-31 DIAGNOSIS — N1831 Chronic kidney disease, stage 3a: Secondary | ICD-10-CM | POA: Diagnosis not present

## 2024-07-31 DIAGNOSIS — F411 Generalized anxiety disorder: Secondary | ICD-10-CM

## 2024-07-31 MED ORDER — TAMSULOSIN HCL 0.4 MG PO CAPS
0.4000 mg | ORAL_CAPSULE | Freq: Every day | ORAL | 1 refills | Status: AC
Start: 2024-07-31 — End: ?

## 2024-07-31 MED ORDER — ESCITALOPRAM OXALATE 10 MG PO TABS: 20.0000 mg | ORAL_TABLET | Freq: Every day | ORAL | 1 refills | Status: DC

## 2024-07-31 MED ORDER — METOPROLOL SUCCINATE ER 25 MG PO TB24
25.0000 mg | ORAL_TABLET | Freq: Two times a day (BID) | ORAL | 1 refills | Status: AC
Start: 2024-07-31 — End: ?

## 2024-07-31 MED ORDER — ALPRAZOLAM 1 MG PO TABS: 1.0000 mg | ORAL_TABLET | Freq: Two times a day (BID) | ORAL | 5 refills | Status: AC | PRN

## 2024-07-31 MED ORDER — FUROSEMIDE 20 MG PO TABS
20.0000 mg | ORAL_TABLET | Freq: Every day | ORAL | 1 refills | Status: DC
Start: 2024-07-31 — End: 2024-08-14

## 2024-07-31 MED ORDER — SIMVASTATIN 40 MG PO TABS: 40.0000 mg | ORAL_TABLET | Freq: Every day | ORAL | 1 refills | Status: DC

## 2024-07-31 NOTE — Progress Notes (Signed)
 Subjective:    Patient ID: Francisco [name], male    DOB: 08-21-1941, 83 y.o.   MRN: [id number]   Chief Complaint: medical management of chronic issues     HPI:  Francisco [name] is a 83 y.o. who identifies as a male who was assigned male at birth.   Social history: Lives with: by himself Work history: retired   Water engineer in today for follow up of the following chronic medical issues:  1. Essential hypertension No c/o chest pain, sob or headache. Does not check blood pressure at home. BP Readings from Last 3 Encounters:  01/31/24 136/70  12/23/23 (!) 149/82  12/09/23 130/77     2. Mixed hyperlipidemia Does not watch diet and does no dedicated exercise. Lab Results  Component Value Date   CHOL 142 08/05/2023   HDL 42 08/05/2023   LDLCALC 66 08/05/2023   TRIG 205 (H) 08/05/2023   CHOLHDL 3.4 08/05/2023     3. GERD without esophagitis Is on protonix  and is doing well.  4. Chronic kidney disease, stage 3a (HCC) No voiding issues. No edema Lab Results  Component Value Date   CREATININE 1.27 (H) 12/23/2023     5. Thrombocytopenia (HCC) Lab Results  Component Value Date   PLT 154 12/23/2023     6. Benign prostatic hyperplasia, unspecified whether lower urinary tract symptoms present No voiding issues. Lab Results  Component Value Date   PSA1 1.4 05/06/2023   PSA1 2.2 04/09/2020   PSA1 1.3 02/09/2018   PSA 1.2 12/27/2014   PSA 1.0 07/14/2013      7. Normocytic anemia Says he feels tired all the time. Is very inactive. Lab Results  Component Value Date   HGB 14.6 12/23/2023     8.  GAD Is on lexapro  and xanax  and is  doing ok.     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 Rates pain 2/10. Increases when he does any movement. Can go as high as 10/10.stabbing type of pain. Does not radiate down his leg at all.  Allergies  Allergen Reactions   Penicillins Anaphylaxis    REACTION: swelling/hives  Has patient had a PCN reaction causing immediate rash, facial/tongue/throat swelling, SOB or lightheadedness with hypotension:yes  Has patient had a PCN reaction causing severe rash involving mucus membranes or skin necrosis: Yes  Has patient had a PCN reaction that required hospitalization No  Has patient had a PCN  reaction occurring within the last 10 years: No  If all of the above answers are NO, then may proceed with Cephalosporin use.  REACTION: swelling/hives, Has patient had a PCN reaction causing immediate rash, facial/tongue/throat swelling, SOB or lightheadedness with hypotension:yes, Has patient had a PCN reaction causing severe rash involving mucus membranes or skin necrosis: Yes, Has patient had a PCN reaction that required hospitalization No, Has patient had a PCN reaction occurring within the last 10 years: No, If all of the above answers are NO, then may proceed with Cephalosporin use.   Crestor [Rosuvastatin] Other (See Comments)    Unknown per Pt   Lipitor [Atorvastatin ] Other (See Comments)    Unknown per Pt   Symbicort  [Budesonide -Formoterol  Fumarate] Other (See Comments)    Pain Behind ribs Unknown per Pt   Outpatient  Encounter Medications as of 07/31/2024  Medication Sig   ALPRAZolam  (XANAX ) 1 MG tablet Take 1 tablet (1 mg total) by mouth 2 (two) times daily as needed. for anxiety   aspirin  81 MG tablet Take 81 mg by mouth daily.   escitalopram  (LEXAPRO ) 10 MG tablet TAKE 2 TABLETS BY MOUTH DAILY   esomeprazole  (NEXIUM ) 40 MG capsule Take 1 capsule (40 mg total) by mouth daily for 14 days.   furosemide  (LASIX ) 20 MG tablet Take 1 tablet (20 mg total) by mouth daily.   meclizine  (ANTIVERT ) 25 MG tablet Take 1 tablet (25 mg total) by mouth 2 (two) times daily.   metoprolol  succinate (TOPROL -XL) 25 MG 24 hr tablet TAKE 1 TABLET BY MOUTH TWICE  DAILY   polyethylene glycol (MIRALAX  / GLYCOLAX ) 17 g packet Take 17 g by mouth 2 (two) times daily.   senna (SENOKOT) 8.6 MG TABS tablet Take 1 tablet (8.6 mg total) by mouth daily.   simvastatin  (ZOCOR ) 40 MG tablet TAKE 1 TABLET BY MOUTH DAILY AT  6 PM.   tamsulosin  (FLOMAX ) 0.4 MG CAPS capsule Take 1 capsule (0.4 mg total) by mouth daily.   No facility-administered encounter medications on file as of 07/31/2024.    Past Surgical History:  Procedure Laterality Date   AMPUTATION Left 08/05/2016   Procedure: REVISION AMPUTATION LEFT INDEX FINGER REPAIR LACERATION LEFT THUMB;  Surgeon: Prentice Pagan, MD;  Location: [facility] OR;  Service: Orthopedics;  Laterality: Left;   BILIARY STENT PLACEMENT N/A 07/09/2022   Procedure: BILIARY STENT PLACEMENT;  Surgeon: Aneita Gwendlyn DASEN, MD;  Location: [facility] ENDOSCOPY;  Service: Gastroenterology;  Laterality: N/A;   CHOLECYSTECTOMY N/A 07/05/2022   Procedure: LAPAROSCOPIC CHOLECYSTECTOMY;  Surgeon: Dasie Leonor CROME, MD;  Location: [facility] ORS;  Service: General;  Laterality: N/A;   ENDOSCOPIC RETROGRADE CHOLANGIOPANCREATOGRAPHY (ERCP) WITH PROPOFOL  N/A 12/08/2022   Procedure: ENDOSCOPIC RETROGRADE CHOLANGIOPANCREATOGRAPHY (ERCP) WITH PROPOFOL ;  Surgeon: Aneita Gwendlyn DASEN, MD;  Location: [facility] ENDOSCOPY;  Service: Gastroenterology;  Laterality: N/A;   ERCP  N/A 07/09/2022   Procedure: ENDOSCOPIC RETROGRADE CHOLANGIOPANCREATOGRAPHY (ERCP);  Surgeon: Aneita Gwendlyn DASEN, MD;  Location: [facility];  Service: Gastroenterology;  Laterality: N/A;   ESOPHAGOGASTRODUODENOSCOPY (EGD) WITH PROPOFOL  N/A 12/08/2022   Procedure: ESOPHAGOGASTRODUODENOSCOPY (EGD) WITH PROPOFOL ;  Surgeon: Shila Gustav GAILS, MD;  Location: [facility] ENDOSCOPY;  Service: Gastroenterology;  Laterality: N/A;   POSTERIOR CERVICAL FUSION/FORAMINOTOMY N/A 08/18/2022   Procedure: C4-5 Posterior cervical fusion with lateral mass fixation;  Surgeon: Gillie Duncans, MD;  Location: [facility] OR;  Service: Neurosurgery;  Laterality: N/A;   REMOVAL OF STONES  12/08/2022   Procedure: REMOVAL OF STONES;  Surgeon: Aneita Gwendlyn DASEN, MD;  Location: [facility] ENDOSCOPY;  Service: Gastroenterology;;   ANNETT  07/09/2022   Procedure: SPHINCTEROTOMY;  Surgeon: Aneita Gwendlyn DASEN, MD;  Location: [facility];  Service: Gastroenterology;;   SPINE SURGERY     STENT REMOVAL  12/08/2022   Procedure: STENT REMOVAL;  Surgeon: Aneita Gwendlyn DASEN, MD;  Location: [facility] ENDOSCOPY;  Service: Gastroenterology;;    Family History  Problem Relation Age of Onset   Healthy Mother    Transient ischemic attack Father    Rheum arthritis Daughter    Amblyopia Neg Hx    Blindness Neg Hx    Cataracts Neg Hx    Glaucoma Neg Hx    Macular degeneration Neg Hx    Retinal detachment Neg Hx    Strabismus Neg Hx    Retinitis pigmentosa Neg Hx       Controlled substance contract: n/a      Review of Systems  Constitutional:  Negative for diaphoresis.  Eyes:  Negative for pain.  Respiratory:  Negative for shortness of breath.   Cardiovascular:  Negative for chest pain, palpitations and leg swelling.  Gastrointestinal:  Negative for abdominal pain.  Endocrine: Negative for polydipsia.  Skin:  Negative for rash.  Neurological:  Negative for dizziness, weakness and headaches.  Hematological:  Does not bruise/bleed easily.  All other  systems reviewed and are negative.      Objective:   Physical Exam Vitals and nursing note reviewed.  Constitutional:      Appearance: Normal appearance. He is well-developed.  HENT:     Head: Normocephalic.     Nose: Nose normal.     Mouth/Throat:     Mouth: Mucous membranes are moist.     Pharynx: Oropharynx is clear.  Eyes:     Pupils: Pupils are equal, round, and reactive to light.  Neck:     Thyroid: No thyroid mass or thyromegaly.     Vascular: No carotid bruit or JVD.     Trachea: Phonation normal.  Cardiovascular:     Rate and Rhythm: Normal rate and regular rhythm.  Pulmonary:     Effort: Pulmonary effort is normal. No respiratory distress.     Breath sounds: Normal breath sounds.  Abdominal:     General: Bowel sounds are normal.     Palpations: Abdomen is soft.     Tenderness: There is no abdominal tenderness.     Hernia: A hernia (soft ventral hernia) is present.  Musculoskeletal:        General: Normal range of motion.     Cervical back: Normal range of motion and neck supple.     Comments: Gait slow and steady  Lymphadenopathy:     Cervical: No cervical adenopathy.  Skin:    General: Skin is warm and dry.  Neurological:     Mental Status: He is alert and oriented to person, place, and time.  Psychiatric:        Behavior: Behavior normal.        Thought Content: Thought content normal.        Judgment: Judgment normal.    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 Essential hypertension Low sodium diet - CBC with Differential/Platelet - CMP14+EGFR  2. Mixed hyperlipidemia Low fta diet - Lipid panel  3. GERD without esophagitis Avoid spicy foods Do not eat 2 hours prior to  bedtime - pantoprazole  (PROTONIX ) 40 MG tablet; Take 1 tablet (40 mg total) by mouth daily.  Dispense: 90 tablet; Refill: 1  4. Chronic kidney disease, stage 3a (HCC) Labs pending  5. Thrombocytopenia (HCC) Labs pending  6. Benign prostatic hyperplasia, unspecified whether lower urinary tract symptoms present Report any voiding issues - tamsulosin  (FLOMAX ) 0.4 MG CAPS capsule; Take 1 capsule (0.4 mg total) by mouth daily.  Dispense: 100 capsule; Refill: 0  7. Normocytic anemia Labs pending  8. Failure to thrive in adult  9. GAD (generalized anxiety disorder) Stress management - ToxASSURE Select 13 (MW), Urine - ALPRAZolam  (XANAX ) 1 MG tablet; Take 1 tablet (1 mg total) by mouth 2 (two) times daily as needed for sleep or anxiety.  Dispense: 60 tablet; Refill: 3  10. Back pain Moist heat Rest Biofreeze if helps  Labs pending Health Maintenance reviewed Diet and exercise encouraged  Follow up plan: 6 months   Mary-Margaret Gladis, FNP

## 2024-07-31 NOTE — Patient Instructions (Signed)
 Acute Back Pain, Adult Acute back pain is sudden and usually short-lived. It is often caused by an injury to the muscles and tissues in the back. The injury may result from: A muscle, tendon, or ligament getting overstretched or torn. Ligaments are tissues that connect bones to each other. Lifting something improperly can cause a back strain. Wear and tear (degeneration) of the spinal disks. Spinal disks are circular tissue that provide cushioning between the bones of the spine (vertebrae). Twisting motions, such as while playing sports or doing yard work. A hit to the back. Arthritis. You may have a physical exam, lab tests, and imaging tests to find the cause of your pain. Acute back pain usually goes away with rest and home care. Follow these instructions at home: Managing pain, stiffness, and swelling Take over-the-counter and prescription medicines only as told by your health care provider. Treatment may include medicines for pain and inflammation that are taken by mouth or applied to the skin, or muscle relaxants. Your health care provider may recommend applying ice during the first 24-48 hours after your pain starts. To do this: Put ice in a plastic bag. Place a towel between your skin and the bag. Leave the ice on for 20 minutes, 2-3 times a day. Remove the ice if your skin turns bright red. This is very important. If you cannot feel pain, heat, or cold, you have a greater risk of damage to the area. If directed, apply heat to the affected area as often as told by your health care provider. Use the heat source that your health care provider recommends, such as a moist heat pack or a heating pad. Place a towel between your skin and the heat source. Leave the heat on for 20-30 minutes. Remove the heat if your skin turns bright red. This is especially important if you are unable to feel pain, heat, or cold. You have a greater risk of getting burned. Activity  Do not stay in bed. Staying in  bed for more than 1-2 days can delay your recovery. Sit up and stand up straight. Avoid leaning forward when you sit or hunching over when you stand. If you work at a desk, sit close to it so you do not need to lean over. Keep your chin tucked in. Keep your neck drawn back, and keep your elbows bent at a 90-degree angle (right angle). Sit high and close to the steering wheel when you drive. Add lower back (lumbar) support to your car seat, if needed. Take short walks on even surfaces as soon as you are able. Try to increase the length of time you walk each day. Do not sit, drive, or stand in one place for more than 30 minutes at a time. Sitting or standing for long periods of time can put stress on your back. Do not drive or use heavy machinery while taking prescription pain medicine. Use proper lifting techniques. When you bend and lift, use positions that put less stress on your back: [place] your knees. Keep the load close to your body. Avoid twisting. Exercise regularly as told by your health care provider. Exercising helps your back heal faster and helps prevent back injuries by keeping muscles strong and flexible. Work with a physical therapist to make a safe exercise program, as recommended by your health care provider. Do any exercises as told by your physical therapist. Lifestyle Maintain a healthy weight. Extra weight puts stress on your back and makes it difficult to have good  posture. Avoid activities or situations that make you feel anxious or stressed. Stress and anxiety increase muscle tension and can make back pain worse. Learn ways to manage anxiety and stress, such as through exercise. General instructions Sleep on a firm mattress in a comfortable position. Try lying on your side with your knees slightly bent. If you lie on your back, put a pillow under your knees. Keep your head and neck in a straight line with your spine (neutral position) when using electronic equipment like  smartphones or pads. To do this: Raise your smartphone or pad to look at it instead of bending your head or neck to look down. Put the smartphone or pad at the level of your face while looking at the screen. Follow your treatment plan as told by your health care provider. This may include: Cognitive or behavioral therapy. Acupuncture or massage therapy. Meditation or yoga. Contact a health care provider if: You have pain that is not relieved with rest or medicine. You have increasing pain going down into your legs or buttocks. Your pain does not improve after 2 weeks. You have pain at night. You lose weight without trying. You have a fever or chills. You develop nausea or vomiting. You develop abdominal pain. Get help right away if: You develop new bowel or bladder control problems. You have unusual weakness or numbness in your arms or legs. You feel faint. These symptoms may represent a serious problem that is an emergency. Do not wait to see if the symptoms will go away. Get medical help right away. Call your local emergency services (911 in the [place] Do not drive yourself to the hospital. Summary Acute back pain is sudden and usually short-lived. Use proper lifting techniques. When you bend and lift, use positions that put less stress on your back. Take over-the-counter and prescription medicines only as told by your health care provider, and apply heat or ice as told. This information is not intended to replace advice given to you by your health care provider. Make sure you discuss any questions you have with your health care provider. Document Revised: 01/24/2021 Document Reviewed: 01/24/2021 Elsevier Patient Education  2024 [employer].

## 2024-08-03 ENCOUNTER — Ambulatory Visit: Admitting: Nurse Practitioner

## 2024-08-03 LAB — TOXASSURE SELECT 13 (MW), URINE

## 2024-08-12 ENCOUNTER — Other Ambulatory Visit: Payer: Self-pay | Admitting: Nurse Practitioner

## 2024-08-12 DIAGNOSIS — I10 Essential (primary) hypertension: Secondary | ICD-10-CM

## 2024-09-25 ENCOUNTER — Other Ambulatory Visit: Payer: Self-pay | Admitting: Nurse Practitioner

## 2024-09-25 DIAGNOSIS — R42 Dizziness and giddiness: Secondary | ICD-10-CM

## 2024-10-04 ENCOUNTER — Telehealth: Payer: Self-pay | Admitting: Nurse Practitioner

## 2024-10-04 DIAGNOSIS — R42 Dizziness and giddiness: Secondary | ICD-10-CM

## 2024-10-04 NOTE — Telephone Encounter (Unsigned)
 Copied from CRM #[id number]. Topic: Clinical - Medication Refill >> Oct 04, 2024  3:35 PM Laurier C wrote: Medication: meclizine  (ANTIVERT ) 25 MG tablet  Has the patient contacted their pharmacy? Yes   This is the patient's preferred pharmacy:  [facility] - [place], [place] - [address] [place] [address] Phone: [phone] Fax: [phone] Hours: Not open 24 hours  Is this the correct pharmacy for this prescription? Yes If no, delete pharmacy and type the correct one.   Has the prescription been filled recently? No  Is the patient out of the medication? Yes  Has the patient been seen for an appointment in the last year OR does the patient have an upcoming appointment? Yes  Can we respond through MyChart? No  Agent: Please be advised that Rx refills may take up to 3 business days. We ask that you follow-up with your pharmacy.

## 2024-10-05 MED ORDER — MECLIZINE HCL 25 MG PO TABS: 25.0000 mg | ORAL_TABLET | Freq: Two times a day (BID) | ORAL | 1 refills | Status: AC

## 2024-10-05 NOTE — Telephone Encounter (Unsigned)
 Copied from CRM [id number]. Topic: Clinical - Prescription Issue >> Oct 05, 2024  2:43 PM Jasmin G wrote: Reason for CRM: Pt's wife to check on the status of Pending Meclizine  HCl 25 m. Pt's wife requested for prescription refill to be sent ASAP as she states that pt has been without it for a couple of days.

## 2024-11-12 ENCOUNTER — Other Ambulatory Visit: Payer: Self-pay | Admitting: Nurse Practitioner

## 2024-11-12 DIAGNOSIS — I10 Essential (primary) hypertension: Secondary | ICD-10-CM

## 2024-12-06 ENCOUNTER — Other Ambulatory Visit: Payer: Self-pay | Admitting: Nurse Practitioner

## 2024-12-06 DIAGNOSIS — F411 Generalized anxiety disorder: Secondary | ICD-10-CM

## 2024-12-06 DIAGNOSIS — E782 Mixed hyperlipidemia: Secondary | ICD-10-CM

## 2024-12-12 ENCOUNTER — Telehealth: Payer: Self-pay

## 2024-12-12 DIAGNOSIS — I10 Essential (primary) hypertension: Secondary | ICD-10-CM

## 2024-12-12 NOTE — Telephone Encounter (Signed)
 Copied from CRM [id number]. Topic: Clinical - Medication Refill >> Dec 12, 2024 11:11 AM Myrick T wrote: Medication: furosemide  (LASIX ) 20 MG tablet and pantoprazole  40mg   Has the patient contacted their pharmacy? No  This is the patient's preferred pharmacy:  [facility] - [place], [place] - [address] [place] [address] Phone: [phone] Fax: [phone]   Has the prescription been filled recently? Yes  Is the patient out of the medication? No  Has the patient been seen for an appointment in the last year OR does the patient have an upcoming appointment? Yes  Can we respond through MyChart? No  Agent: Please be advised that Rx refills may take up to 3 business days. We ask that you follow-up with your pharmacy.

## 2024-12-14 ENCOUNTER — Other Ambulatory Visit: Payer: Self-pay

## 2024-12-14 DIAGNOSIS — I10 Essential (primary) hypertension: Secondary | ICD-10-CM

## 2024-12-14 MED ORDER — FUROSEMIDE 20 MG PO TABS: 20.0000 mg | ORAL_TABLET | Freq: Every day | ORAL | 0 refills | Status: AC

## 2024-12-14 NOTE — Telephone Encounter (Signed)
 Copied from CRM [id number]. Topic: Clinical - Medication Refill >> Dec 12, 2024 11:11 AM Myrick T wrote: Medication: furosemide  (LASIX ) 20 MG tablet and pantoprazole  40mg   Has the patient contacted their pharmacy? No  This is the patient's preferred pharmacy:  [facility] - [place], [place] - [address] [place] [address] Phone: [phone] Fax: [phone]   Has the prescription been filled recently? Yes  Is the patient out of the medication? No  Has the patient been seen for an appointment in the last year OR does the patient have an upcoming appointment? Yes  Can we respond through MyChart? No  Agent: Please be advised that Rx refills may take up to 3 business days. We ask that you follow-up with your pharmacy. >> Dec 14, 2024  9:47 AM Graeme ORN wrote: Patient daughter called. Checking status. States she called last week to request refills has not heard back. Has been stuck in the house all week is only going out once and that's to pick up medication today from pharmacy. Look like there were refills on one but sent to [facility] - she wants sent to [facility] as requested. Please contact her today with update.  Thank You

## 2024-12-14 NOTE — Telephone Encounter (Signed)
 Informed that PCP sent Lasix  to [facility] on 11/14/24 for #100 w/ 2 RFs, she actually got that RF today at [facility]. Asking about the Pantoprazole , this was DC'd last Feb when he went to the ED and changed to Esomeprazole . Verbalized understanding.

## 2025-01-15 ENCOUNTER — Ambulatory Visit: Payer: Self-pay

## 2025-01-25 ENCOUNTER — Ambulatory Visit: Payer: Self-pay | Admitting: Nurse Practitioner
